# Patient Record
Sex: Female | Born: 1965 | Race: White | Hispanic: No | Smoking: Current every day smoker
Health system: Southern US, Community
[De-identification: ages and names within clinical notes are randomized; demographics above are authoritative.]

## PROBLEM LIST (undated history)

## (undated) VITALS — BP 89/56 | HR 69 | Temp 97.7°F | Resp 16 | Ht 66.0 in | Wt 154.0 lb

## (undated) DIAGNOSIS — IMO0002 Reserved for concepts with insufficient information to code with codable children: Secondary | ICD-10-CM

## (undated) DIAGNOSIS — F429 Obsessive-compulsive disorder, unspecified: Secondary | ICD-10-CM

## (undated) DIAGNOSIS — D45 Polycythemia vera: Principal | ICD-10-CM

## (undated) DIAGNOSIS — M719 Bursopathy, unspecified: Secondary | ICD-10-CM

## (undated) DIAGNOSIS — F4310 Post-traumatic stress disorder, unspecified: Secondary | ICD-10-CM

## (undated) DIAGNOSIS — F32A Depression, unspecified: Secondary | ICD-10-CM

## (undated) DIAGNOSIS — M797 Fibromyalgia: Secondary | ICD-10-CM

## (undated) DIAGNOSIS — M1990 Unspecified osteoarthritis, unspecified site: Secondary | ICD-10-CM

## (undated) DIAGNOSIS — I493 Ventricular premature depolarization: Secondary | ICD-10-CM

## (undated) DIAGNOSIS — R1011 Right upper quadrant pain: Secondary | ICD-10-CM

## (undated) DIAGNOSIS — F419 Anxiety disorder, unspecified: Secondary | ICD-10-CM

## (undated) DIAGNOSIS — H548 Legal blindness, as defined in USA: Secondary | ICD-10-CM

## (undated) DIAGNOSIS — N3281 Overactive bladder: Secondary | ICD-10-CM

## (undated) DIAGNOSIS — M2580 Other specified joint disorders, unspecified joint: Secondary | ICD-10-CM

## (undated) DIAGNOSIS — F329 Major depressive disorder, single episode, unspecified: Secondary | ICD-10-CM

## (undated) HISTORY — DX: Obsessive-compulsive disorder, unspecified: F42.9

## (undated) HISTORY — PX: LUMBAR FUSION: SHX111

## (undated) HISTORY — DX: Legal blindness, as defined in USA: H54.8

## (undated) HISTORY — PX: ABDOMINAL HYSTERECTOMY: SHX81

## (undated) HISTORY — DX: Polycythemia vera: D45

## (undated) HISTORY — DX: Right upper quadrant pain: R10.11

---

## 2000-08-21 ENCOUNTER — Encounter (INDEPENDENT_AMBULATORY_CARE_PROVIDER_SITE_OTHER): Payer: Self-pay | Admitting: Specialist

## 2000-08-21 ENCOUNTER — Other Ambulatory Visit: Admission: RE | Admit: 2000-08-21 | Discharge: 2000-08-21 | Payer: Self-pay | Admitting: Gastroenterology

## 2000-08-21 ENCOUNTER — Encounter: Payer: Self-pay | Admitting: Gastroenterology

## 2001-03-03 ENCOUNTER — Other Ambulatory Visit: Admission: RE | Admit: 2001-03-03 | Discharge: 2001-03-03 | Payer: Self-pay | Admitting: Gynecology

## 2002-04-08 ENCOUNTER — Other Ambulatory Visit: Admission: RE | Admit: 2002-04-08 | Discharge: 2002-04-08 | Payer: Self-pay | Admitting: Gynecology

## 2002-09-18 ENCOUNTER — Ambulatory Visit (HOSPITAL_COMMUNITY): Admission: RE | Admit: 2002-09-18 | Discharge: 2002-09-18 | Payer: Self-pay | Admitting: Family Medicine

## 2002-09-18 ENCOUNTER — Encounter: Payer: Self-pay | Admitting: Family Medicine

## 2003-04-26 ENCOUNTER — Other Ambulatory Visit: Admission: RE | Admit: 2003-04-26 | Discharge: 2003-04-26 | Payer: Self-pay | Admitting: Gynecology

## 2004-01-19 ENCOUNTER — Encounter (HOSPITAL_COMMUNITY): Admission: RE | Admit: 2004-01-19 | Discharge: 2004-04-18 | Payer: Self-pay | Admitting: Oncology

## 2004-02-28 ENCOUNTER — Other Ambulatory Visit: Admission: RE | Admit: 2004-02-28 | Discharge: 2004-02-28 | Payer: Self-pay | Admitting: Gynecology

## 2004-02-29 ENCOUNTER — Encounter: Admission: RE | Admit: 2004-02-29 | Discharge: 2004-02-29 | Payer: Self-pay | Admitting: Gynecology

## 2004-03-10 ENCOUNTER — Ambulatory Visit (HOSPITAL_COMMUNITY): Admission: RE | Admit: 2004-03-10 | Discharge: 2004-03-10 | Payer: Self-pay | Admitting: Family Medicine

## 2004-10-16 ENCOUNTER — Ambulatory Visit: Payer: Self-pay | Admitting: Oncology

## 2004-12-19 ENCOUNTER — Ambulatory Visit: Payer: Self-pay | Admitting: Oncology

## 2005-04-05 ENCOUNTER — Encounter (INDEPENDENT_AMBULATORY_CARE_PROVIDER_SITE_OTHER): Payer: Self-pay | Admitting: Specialist

## 2005-04-05 ENCOUNTER — Inpatient Hospital Stay (HOSPITAL_COMMUNITY): Admission: RE | Admit: 2005-04-05 | Discharge: 2005-04-07 | Payer: Self-pay | Admitting: Gynecology

## 2005-05-03 ENCOUNTER — Ambulatory Visit: Payer: Self-pay | Admitting: Oncology

## 2005-06-28 ENCOUNTER — Ambulatory Visit: Payer: Self-pay | Admitting: Oncology

## 2005-07-12 ENCOUNTER — Ambulatory Visit: Payer: Self-pay | Admitting: Oncology

## 2005-07-12 ENCOUNTER — Ambulatory Visit (HOSPITAL_COMMUNITY): Admission: RE | Admit: 2005-07-12 | Discharge: 2005-07-12 | Payer: Self-pay | Admitting: Oncology

## 2005-07-12 ENCOUNTER — Encounter (INDEPENDENT_AMBULATORY_CARE_PROVIDER_SITE_OTHER): Payer: Self-pay | Admitting: *Deleted

## 2005-07-19 ENCOUNTER — Encounter (HOSPITAL_COMMUNITY): Admission: RE | Admit: 2005-07-19 | Discharge: 2005-10-17 | Payer: Self-pay | Admitting: Oncology

## 2005-08-21 ENCOUNTER — Ambulatory Visit (HOSPITAL_COMMUNITY): Admission: RE | Admit: 2005-08-21 | Discharge: 2005-08-21 | Payer: Self-pay | Admitting: Family Medicine

## 2005-08-23 ENCOUNTER — Ambulatory Visit: Payer: Self-pay | Admitting: Oncology

## 2005-10-23 ENCOUNTER — Ambulatory Visit: Payer: Self-pay | Admitting: Oncology

## 2005-11-15 ENCOUNTER — Encounter: Admission: RE | Admit: 2005-11-15 | Discharge: 2005-11-15 | Payer: Self-pay | Admitting: Specialist

## 2005-12-27 ENCOUNTER — Ambulatory Visit: Payer: Self-pay | Admitting: Oncology

## 2006-02-28 ENCOUNTER — Ambulatory Visit: Payer: Self-pay | Admitting: Oncology

## 2006-03-01 LAB — CBC WITH DIFFERENTIAL/PLATELET
BASO%: 0.6 % (ref 0.0–2.0)
Basophils Absolute: 0 10*3/uL (ref 0.0–0.1)
EOS%: 2.1 % (ref 0.0–7.0)
HGB: 13.7 g/dL (ref 11.6–15.9)
MCH: 30.6 pg (ref 26.0–34.0)
MCHC: 33.2 g/dL (ref 32.0–36.0)
MCV: 92.2 fL (ref 81.0–101.0)
MONO%: 7.9 % (ref 0.0–13.0)
NEUT%: 55.2 % (ref 39.6–76.8)
RDW: 14.6 % — ABNORMAL HIGH (ref 11.3–14.5)

## 2006-04-19 ENCOUNTER — Ambulatory Visit: Payer: Self-pay | Admitting: Oncology

## 2006-04-19 LAB — CBC WITH DIFFERENTIAL/PLATELET
Basophils Absolute: 0 10*3/uL (ref 0.0–0.1)
EOS%: 1.7 % (ref 0.0–7.0)
Eosinophils Absolute: 0.1 10*3/uL (ref 0.0–0.5)
HCT: 38.4 % (ref 34.8–46.6)
HGB: 12.9 g/dL (ref 11.6–15.9)
MCH: 30.3 pg (ref 26.0–34.0)
MCV: 89.9 fL (ref 81.0–101.0)
MONO%: 8 % (ref 0.0–13.0)
NEUT#: 4.6 10*3/uL (ref 1.5–6.5)
NEUT%: 60.3 % (ref 39.6–76.8)
RDW: 14.3 % (ref 11.3–14.5)
lymph#: 2.3 10*3/uL (ref 0.9–3.3)

## 2006-04-19 LAB — MORPHOLOGY: PLT EST: ADEQUATE

## 2006-04-22 LAB — SEDIMENTATION RATE: Sed Rate: 5 mm/hr (ref 0–22)

## 2006-04-22 LAB — COMPREHENSIVE METABOLIC PANEL
ALT: 10 U/L (ref 0–40)
AST: 11 U/L (ref 0–37)
Albumin: 4.4 g/dL (ref 3.5–5.2)
CO2: 23 mEq/L (ref 19–32)
Calcium: 8.5 mg/dL (ref 8.4–10.5)
Chloride: 106 mEq/L (ref 96–112)
Potassium: 3.7 mEq/L (ref 3.5–5.3)

## 2006-04-22 LAB — LACTATE DEHYDROGENASE: LDH: 116 U/L (ref 94–250)

## 2006-04-22 LAB — ERYTHROPOIETIN: Erythropoietin: 12.5 m[IU]/mL (ref 2.6–34.0)

## 2006-06-19 ENCOUNTER — Ambulatory Visit: Payer: Self-pay | Admitting: Oncology

## 2006-06-21 LAB — CBC WITH DIFFERENTIAL/PLATELET
BASO%: 0.5 % (ref 0.0–2.0)
EOS%: 2.6 % (ref 0.0–7.0)
HCT: 46.2 % (ref 34.8–46.6)
LYMPH%: 28.5 % (ref 14.0–48.0)
MCH: 31.7 pg (ref 26.0–34.0)
MCHC: 33.8 g/dL (ref 32.0–36.0)
MCV: 93.8 fL (ref 81.0–101.0)
NEUT%: 59.3 % (ref 39.6–76.8)
Platelets: 351 10*3/uL (ref 145–400)

## 2006-06-25 LAB — VITAMIN D PNL(25-HYDRXY+1,25-DIHY)-BLD: Vit D, 1,25-Dihydroxy: 65 pg/mL (ref 15–75)

## 2006-07-05 LAB — CBC WITH DIFFERENTIAL/PLATELET
Basophils Absolute: 0.1 10*3/uL (ref 0.0–0.1)
Eosinophils Absolute: 0.2 10*3/uL (ref 0.0–0.5)
HGB: 15.9 g/dL (ref 11.6–15.9)
MCV: 93.8 fL (ref 81.0–101.0)
MONO%: 8.3 % (ref 0.0–13.0)
NEUT#: 6.2 10*3/uL (ref 1.5–6.5)
Platelets: 289 10*3/uL (ref 145–400)
RDW: 15.3 % — ABNORMAL HIGH (ref 11.3–14.5)

## 2006-08-16 ENCOUNTER — Ambulatory Visit: Payer: Self-pay | Admitting: Oncology

## 2006-08-28 LAB — CBC WITH DIFFERENTIAL/PLATELET
BASO%: 0.5 % (ref 0.0–2.0)
Eosinophils Absolute: 0.1 10*3/uL (ref 0.0–0.5)
HCT: 49.1 % — ABNORMAL HIGH (ref 34.8–46.6)
LYMPH%: 28.2 % (ref 14.0–48.0)
MCHC: 34.4 g/dL (ref 32.0–36.0)
MCV: 96.5 fL (ref 81.0–101.0)
MONO#: 0.9 10*3/uL (ref 0.1–0.9)
MONO%: 10.3 % (ref 0.0–13.0)
NEUT%: 59.7 % (ref 39.6–76.8)
Platelets: 362 10*3/uL (ref 145–400)
RBC: 5.09 10*6/uL (ref 3.70–5.32)
WBC: 8.7 10*3/uL (ref 3.9–10.0)

## 2006-10-01 ENCOUNTER — Ambulatory Visit: Payer: Self-pay | Admitting: Oncology

## 2006-11-11 ENCOUNTER — Ambulatory Visit: Payer: Self-pay | Admitting: Oncology

## 2006-11-11 LAB — CBC WITH DIFFERENTIAL/PLATELET
BASO%: 1.2 % (ref 0.0–2.0)
Basophils Absolute: 0.1 10*3/uL (ref 0.0–0.1)
EOS%: 1.7 % (ref 0.0–7.0)
HGB: 18.4 g/dL — ABNORMAL HIGH (ref 11.6–15.9)
MCH: 33.2 pg (ref 26.0–34.0)
MCHC: 33.9 g/dL (ref 32.0–36.0)
MCV: 97.7 fL (ref 81.0–101.0)
MONO%: 8 % (ref 0.0–13.0)
RBC: 5.54 10*6/uL — ABNORMAL HIGH (ref 3.70–5.32)
RDW: 13.7 % (ref 11.3–14.5)
lymph#: 2.1 10*3/uL (ref 0.9–3.3)

## 2006-11-11 LAB — IRON AND TIBC
Iron: 82 ug/dL (ref 42–145)
UIBC: 277 ug/dL

## 2006-11-15 LAB — CBC WITH DIFFERENTIAL/PLATELET
BASO%: 1.1 % (ref 0.0–2.0)
LYMPH%: 23.7 % (ref 14.0–48.0)
MCHC: 34.9 g/dL (ref 32.0–36.0)
MCV: 94.3 fL (ref 81.0–101.0)
MONO%: 11.5 % (ref 0.0–13.0)
Platelets: 268 10*3/uL (ref 145–400)
RBC: 5.44 10*6/uL — ABNORMAL HIGH (ref 3.70–5.32)
RDW: 11.6 % (ref 11.3–14.5)
WBC: 8.7 10*3/uL (ref 3.9–10.0)

## 2006-12-20 LAB — CBC WITH DIFFERENTIAL/PLATELET
BASO%: 0.6 % (ref 0.0–2.0)
MCHC: 35.6 g/dL (ref 32.0–36.0)
MONO#: 0.7 10*3/uL (ref 0.1–0.9)
RBC: 4.99 10*6/uL (ref 3.70–5.32)
RDW: 13.1 % (ref 11.3–14.5)
WBC: 7.3 10*3/uL (ref 3.9–10.0)
lymph#: 2 10*3/uL (ref 0.9–3.3)

## 2007-02-18 ENCOUNTER — Ambulatory Visit: Payer: Self-pay | Admitting: Oncology

## 2007-02-21 LAB — CBC WITH DIFFERENTIAL/PLATELET
Basophils Absolute: 0 10*3/uL (ref 0.0–0.1)
Eosinophils Absolute: 0.2 10*3/uL (ref 0.0–0.5)
HGB: 16.7 g/dL — ABNORMAL HIGH (ref 11.6–15.9)
MCV: 96.7 fL (ref 81.0–101.0)
MONO#: 1 10*3/uL — ABNORMAL HIGH (ref 0.1–0.9)
MONO%: 12 % (ref 0.0–13.0)
NEUT#: 5.1 10*3/uL (ref 1.5–6.5)
Platelets: 275 10*3/uL (ref 145–400)
RDW: 13.1 % (ref 11.3–14.5)
WBC: 8.5 10*3/uL (ref 3.9–10.0)

## 2007-03-13 ENCOUNTER — Encounter: Admission: RE | Admit: 2007-03-13 | Discharge: 2007-03-13 | Payer: Self-pay | Admitting: Specialist

## 2007-05-01 ENCOUNTER — Ambulatory Visit: Payer: Self-pay | Admitting: Oncology

## 2007-05-05 ENCOUNTER — Other Ambulatory Visit: Admission: RE | Admit: 2007-05-05 | Discharge: 2007-05-05 | Payer: Self-pay | Admitting: Gynecology

## 2007-05-05 LAB — CBC WITH DIFFERENTIAL/PLATELET
Basophils Absolute: 0.1 10*3/uL (ref 0.0–0.1)
Eosinophils Absolute: 0.1 10*3/uL (ref 0.0–0.5)
HCT: 45.9 % (ref 34.8–46.6)
LYMPH%: 27 % (ref 14.0–48.0)
MCV: 98.1 fL (ref 81.0–101.0)
MONO#: 0.6 10*3/uL (ref 0.1–0.9)
MONO%: 9.3 % (ref 0.0–13.0)
NEUT#: 4.2 10*3/uL (ref 1.5–6.5)
NEUT%: 61.9 % (ref 39.6–76.8)
Platelets: 296 10*3/uL (ref 145–400)
RBC: 4.69 10*6/uL (ref 3.70–5.32)
WBC: 6.8 10*3/uL (ref 3.9–10.0)

## 2007-05-05 LAB — COMPREHENSIVE METABOLIC PANEL
Albumin: 4.3 g/dL (ref 3.5–5.2)
BUN: 8 mg/dL (ref 6–23)
CO2: 25 mEq/L (ref 19–32)
Calcium: 8.9 mg/dL (ref 8.4–10.5)
Chloride: 104 mEq/L (ref 96–112)
Glucose, Bld: 92 mg/dL (ref 70–99)
Potassium: 3.9 mEq/L (ref 3.5–5.3)
Sodium: 140 mEq/L (ref 135–145)
Total Protein: 6.5 g/dL (ref 6.0–8.3)

## 2007-05-05 LAB — IRON AND TIBC
TIBC: 332 ug/dL (ref 250–470)
UIBC: 211 ug/dL

## 2007-05-05 LAB — VITAMIN B12: Vitamin B-12: 290 pg/mL (ref 211–911)

## 2007-06-11 ENCOUNTER — Ambulatory Visit: Payer: Self-pay | Admitting: Oncology

## 2007-06-13 LAB — CBC WITH DIFFERENTIAL/PLATELET
BASO%: 0.5 % (ref 0.0–2.0)
Basophils Absolute: 0 10*3/uL (ref 0.0–0.1)
HCT: 44.1 % (ref 34.8–46.6)
HGB: 15.8 g/dL (ref 11.6–15.9)
LYMPH%: 29 % (ref 14.0–48.0)
MCH: 35 pg — ABNORMAL HIGH (ref 26.0–34.0)
MCHC: 35.9 g/dL (ref 32.0–36.0)
MONO#: 0.5 10*3/uL (ref 0.1–0.9)
NEUT%: 60.2 % (ref 39.6–76.8)
Platelets: 296 10*3/uL (ref 145–400)
WBC: 6.1 10*3/uL (ref 3.9–10.0)
lymph#: 1.8 10*3/uL (ref 0.9–3.3)

## 2007-08-15 ENCOUNTER — Encounter (HOSPITAL_COMMUNITY): Admission: RE | Admit: 2007-08-15 | Discharge: 2007-10-16 | Payer: Self-pay | Admitting: Oncology

## 2007-10-08 ENCOUNTER — Encounter: Admission: RE | Admit: 2007-10-08 | Discharge: 2007-10-08 | Payer: Self-pay | Admitting: Family Medicine

## 2007-10-17 ENCOUNTER — Encounter (HOSPITAL_COMMUNITY): Admission: RE | Admit: 2007-10-17 | Discharge: 2007-11-12 | Payer: Self-pay | Admitting: Oncology

## 2007-11-13 ENCOUNTER — Encounter (HOSPITAL_COMMUNITY): Admission: RE | Admit: 2007-11-13 | Discharge: 2008-02-11 | Payer: Self-pay | Admitting: Oncology

## 2008-04-01 ENCOUNTER — Encounter (HOSPITAL_COMMUNITY): Admission: RE | Admit: 2008-04-01 | Discharge: 2008-06-30 | Payer: Self-pay | Admitting: Oncology

## 2008-06-06 ENCOUNTER — Ambulatory Visit: Payer: Self-pay | Admitting: Oncology

## 2008-06-14 LAB — MORPHOLOGY
PLT EST: ADEQUATE
RBC Comments: NORMAL

## 2008-06-14 LAB — COMPREHENSIVE METABOLIC PANEL
ALT: 10 U/L (ref 0–35)
AST: 10 U/L (ref 0–37)
Albumin: 4.2 g/dL (ref 3.5–5.2)
Alkaline Phosphatase: 57 U/L (ref 39–117)
Chloride: 109 mEq/L (ref 96–112)
Potassium: 4.1 mEq/L (ref 3.5–5.3)
Sodium: 140 mEq/L (ref 135–145)
Total Protein: 6.3 g/dL (ref 6.0–8.3)

## 2008-06-14 LAB — CBC WITH DIFFERENTIAL/PLATELET
Basophils Absolute: 0 10*3/uL (ref 0.0–0.1)
Eosinophils Absolute: 0.1 10*3/uL (ref 0.0–0.5)
HGB: 16.1 g/dL — ABNORMAL HIGH (ref 11.6–15.9)
MONO#: 0.7 10*3/uL (ref 0.1–0.9)
NEUT#: 4.3 10*3/uL (ref 1.5–6.5)
RBC: 4.7 10*6/uL (ref 3.70–5.32)
RDW: 13.6 % (ref 11.3–14.5)
WBC: 7 10*3/uL (ref 3.9–10.0)

## 2008-06-14 LAB — IRON AND TIBC: UIBC: 174 ug/dL

## 2008-06-14 LAB — FERRITIN: Ferritin: 18 ng/mL (ref 10–291)

## 2008-07-22 ENCOUNTER — Encounter (HOSPITAL_COMMUNITY): Admission: RE | Admit: 2008-07-22 | Discharge: 2008-10-20 | Payer: Self-pay | Admitting: Oncology

## 2008-09-29 ENCOUNTER — Ambulatory Visit (HOSPITAL_COMMUNITY): Admission: RE | Admit: 2008-09-29 | Discharge: 2008-09-29 | Payer: Self-pay | Admitting: Family Medicine

## 2008-09-29 IMAGING — US US ABDOMEN COMPLETE
1 series · 14 of 25 positions shown · non-contrast
Comparison: None

CLINICAL DATA: Right upper quadrant pain, nausea, fatigue

ABDOMEN ULTRASOUND
TECHNIQUE: Complete abdominal ultrasound examination was performed
including evaluation of the liver, gallbladder, bile ducts,
pancreas, kidneys, spleen, IVC, and abdominal aorta.

[Series 1: unknown · 0.30mm/px · 14 of 67 slices shown]
[im 1/67]
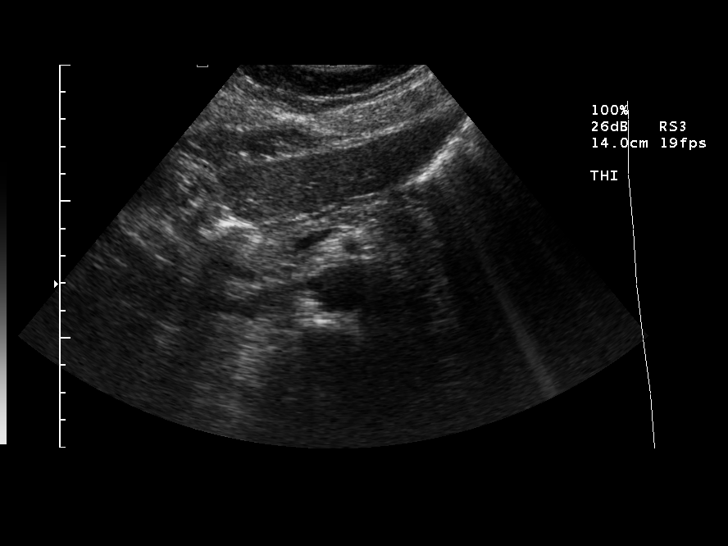
[im 6/67]
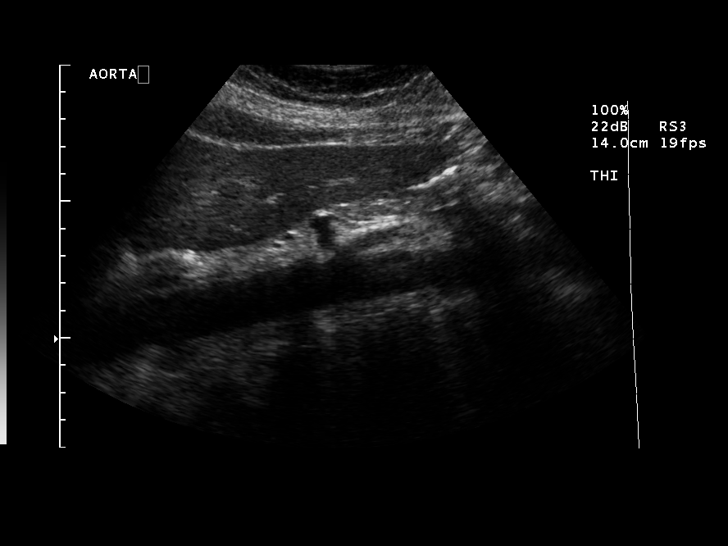
[im 12/67]
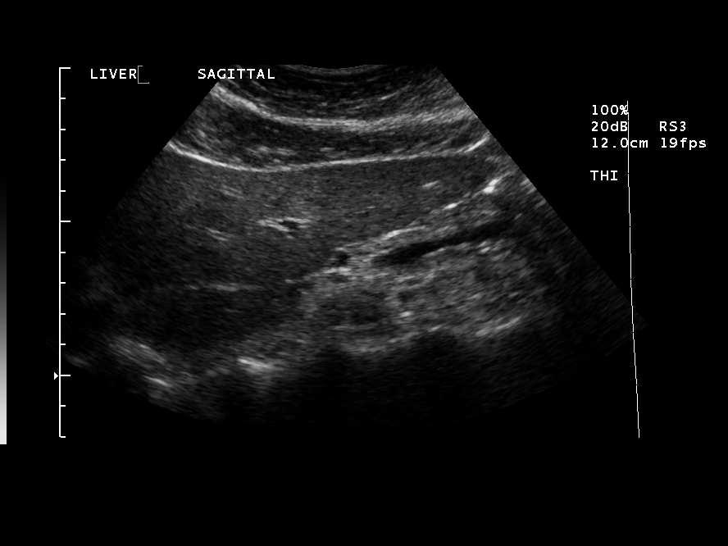
[im 17/67]
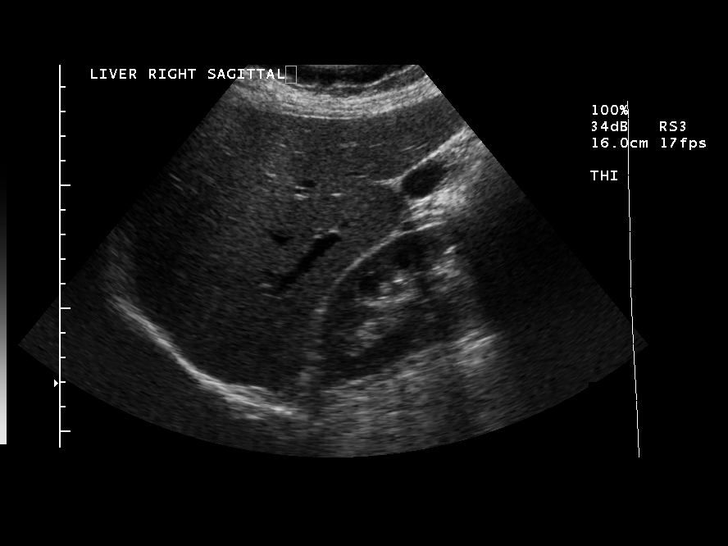
[im 23/67]
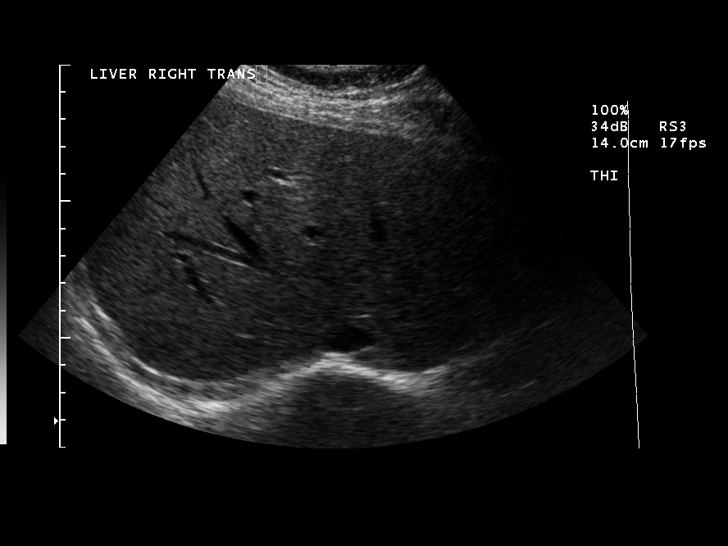
[im 25/67]
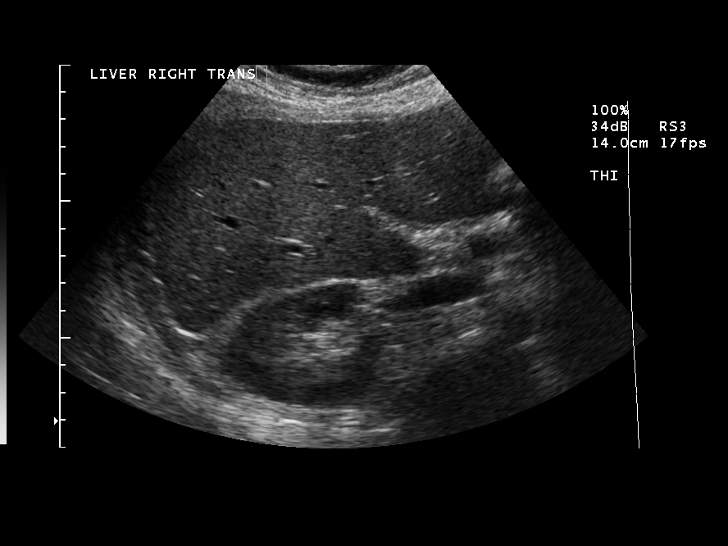
[im 31/67]
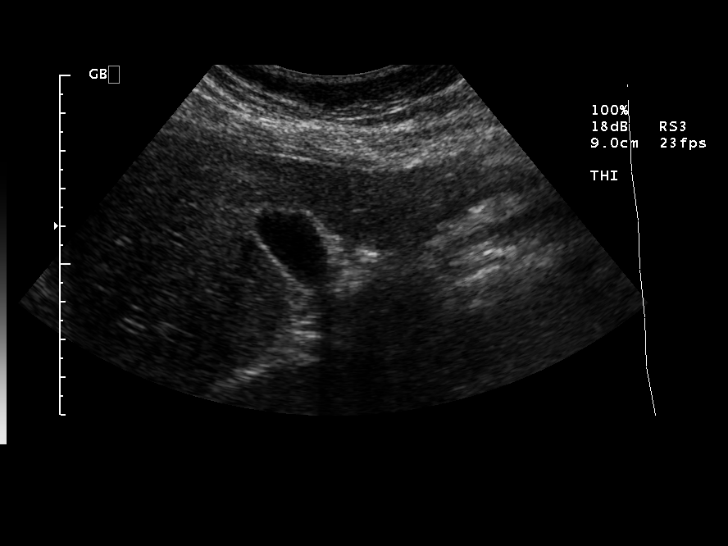
[im 36/67]
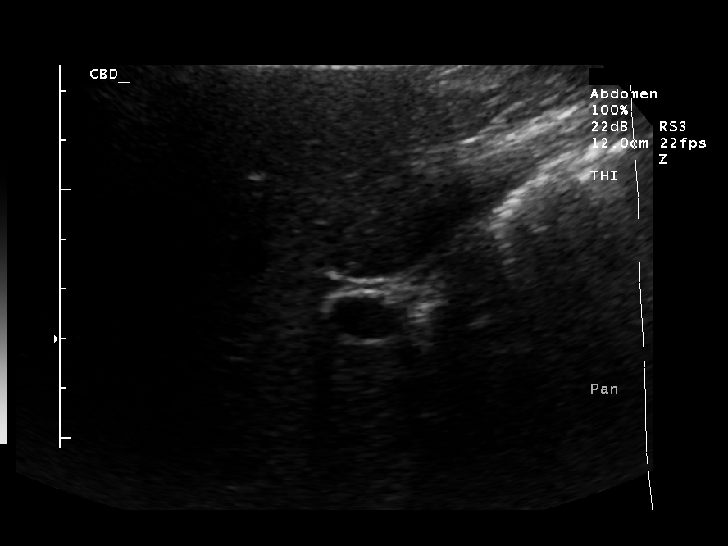
[im 42/67]
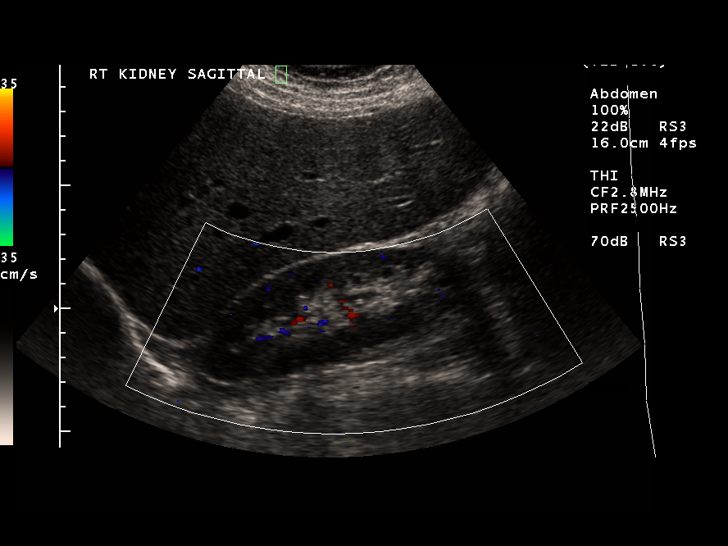
[im 45/67]
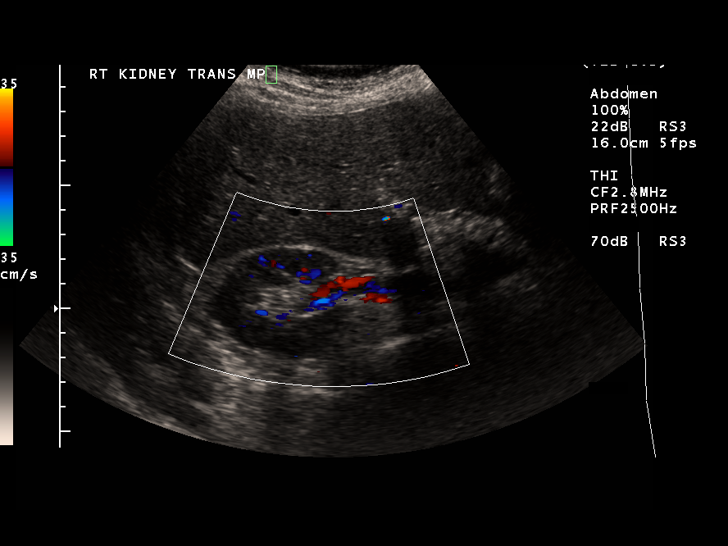
[im 50/67]
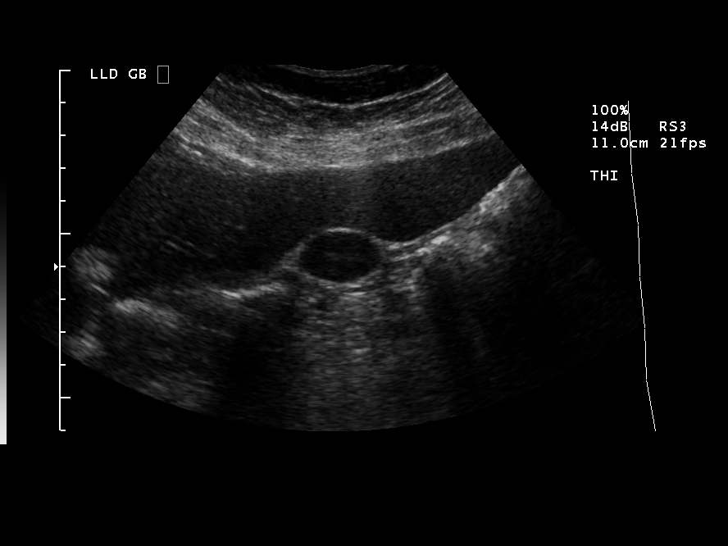
[im 56/67]
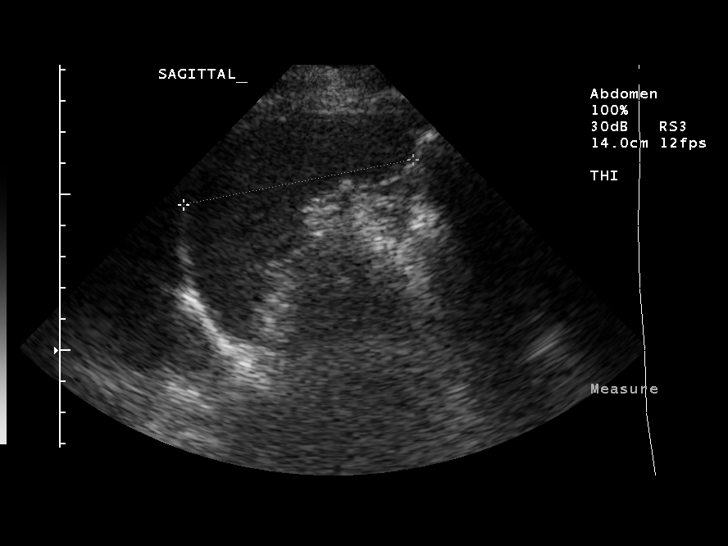
[im 61/67]
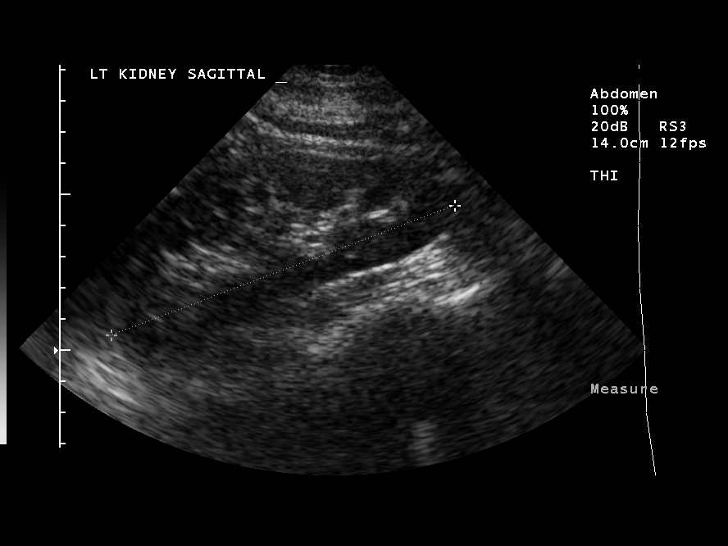
[im 67/67]
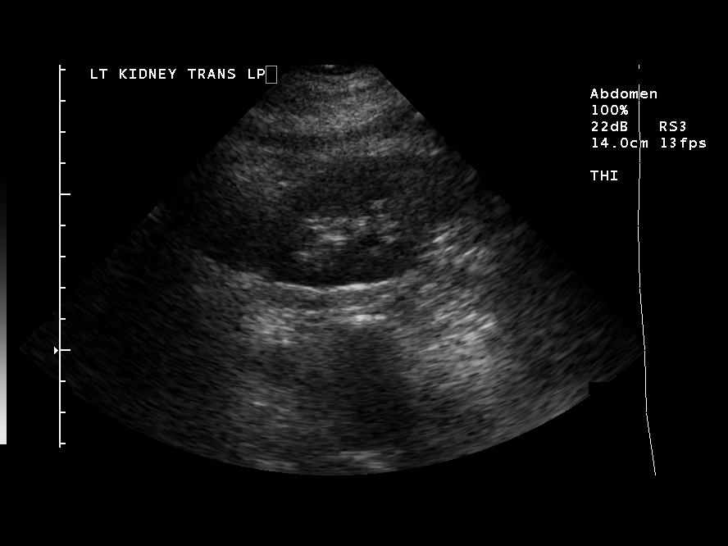

[14 of 25 positions shown; findings below may reference images not displayed]

FINDINGS: Gallbladder normally distended without stones or wall thickening.
No sonographic Murphy sign.
Common bile duct normal caliber, 3.0 mm diameter.
Liver, pancreas, and spleen normal appearance, spleen 7.5 cm
length.
Kidneys normal size and morphology, 12.6 cm length right and
cm length left.
Aorta and IVC normal.
No free fluid.
IMPRESSION: Normal upper abdominal ultrasound.
Per physician order, patient will be scheduled for hepatic biliary
scan.

## 2008-11-01 ENCOUNTER — Encounter: Admission: RE | Admit: 2008-11-01 | Discharge: 2008-11-01 | Payer: Self-pay | Admitting: Specialist

## 2008-11-08 ENCOUNTER — Encounter: Admission: RE | Admit: 2008-11-08 | Discharge: 2008-11-08 | Payer: Self-pay | Admitting: Gynecology

## 2008-11-08 IMAGING — MG MM SCREEN MAMMOGRAM BILATERAL
4 series · 4 of 4 positions shown · non-contrast
Comparison: none

DG SCREEN MAMMOGRAM BILATERAL
Bilateral CC and MLO view(s) were taken.

DIGITAL SCREENING MAMMOGRAM WITH CAD:
The breast tissue is heterogeneously dense.  No masses or malignant type calcifications are 
identified.  Compared with prior studies.

[R CC]
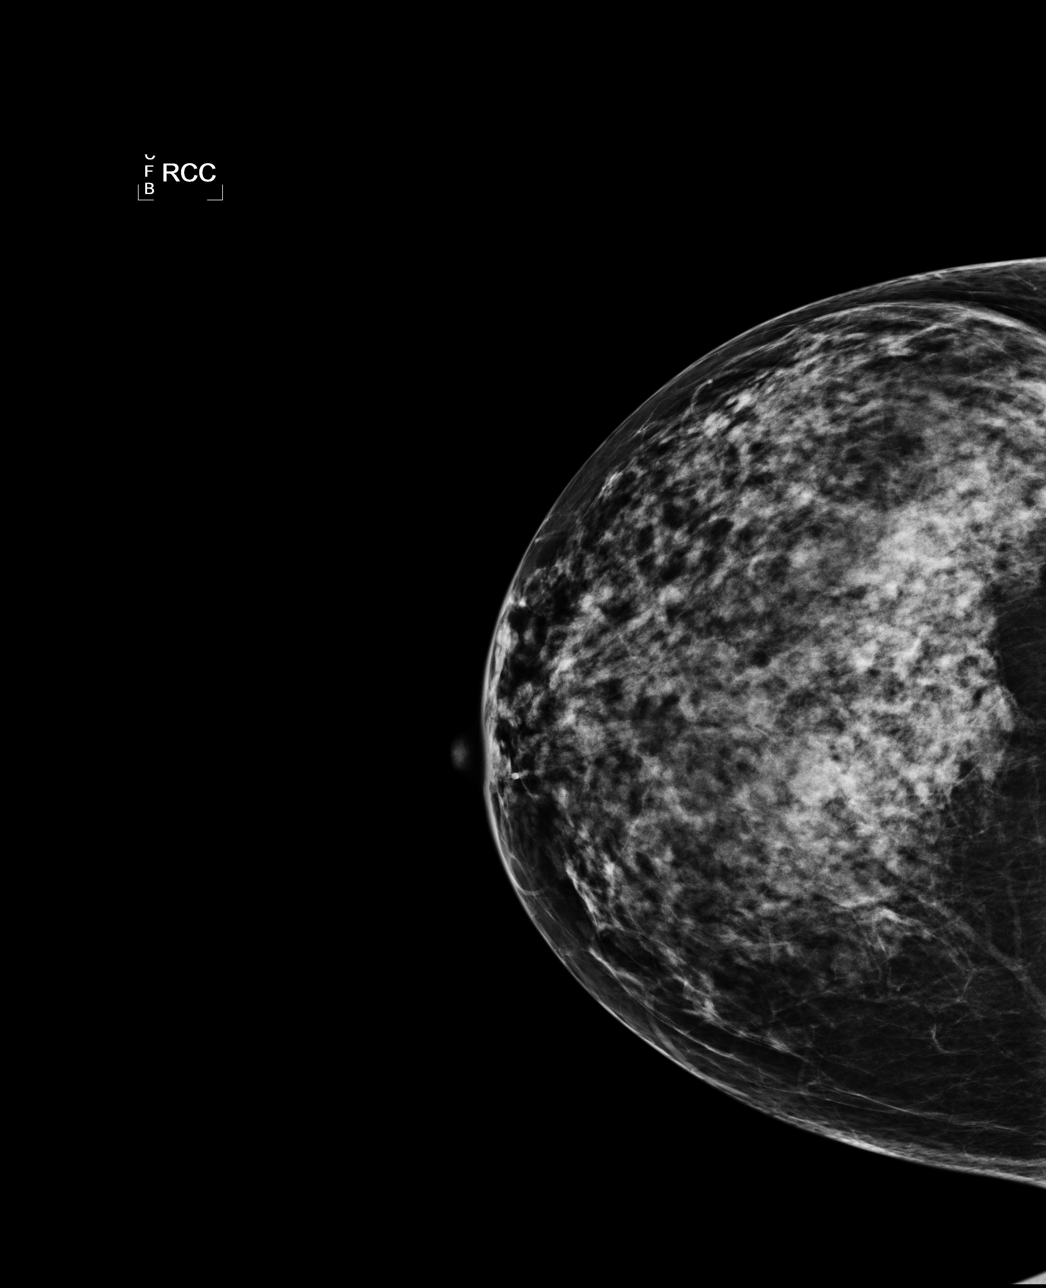

[L CC]
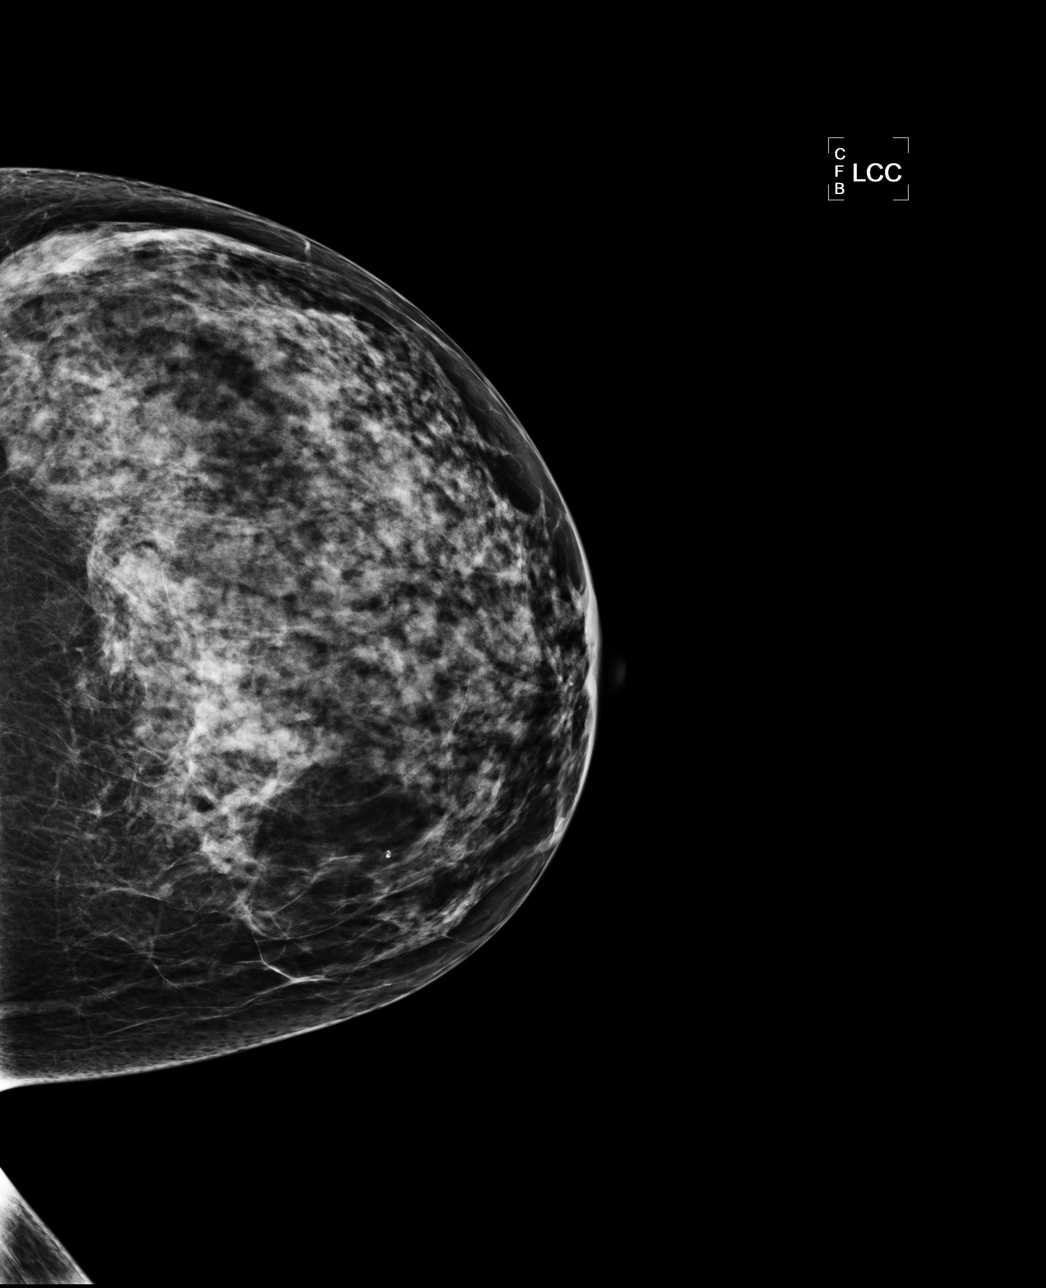

[L MLO]
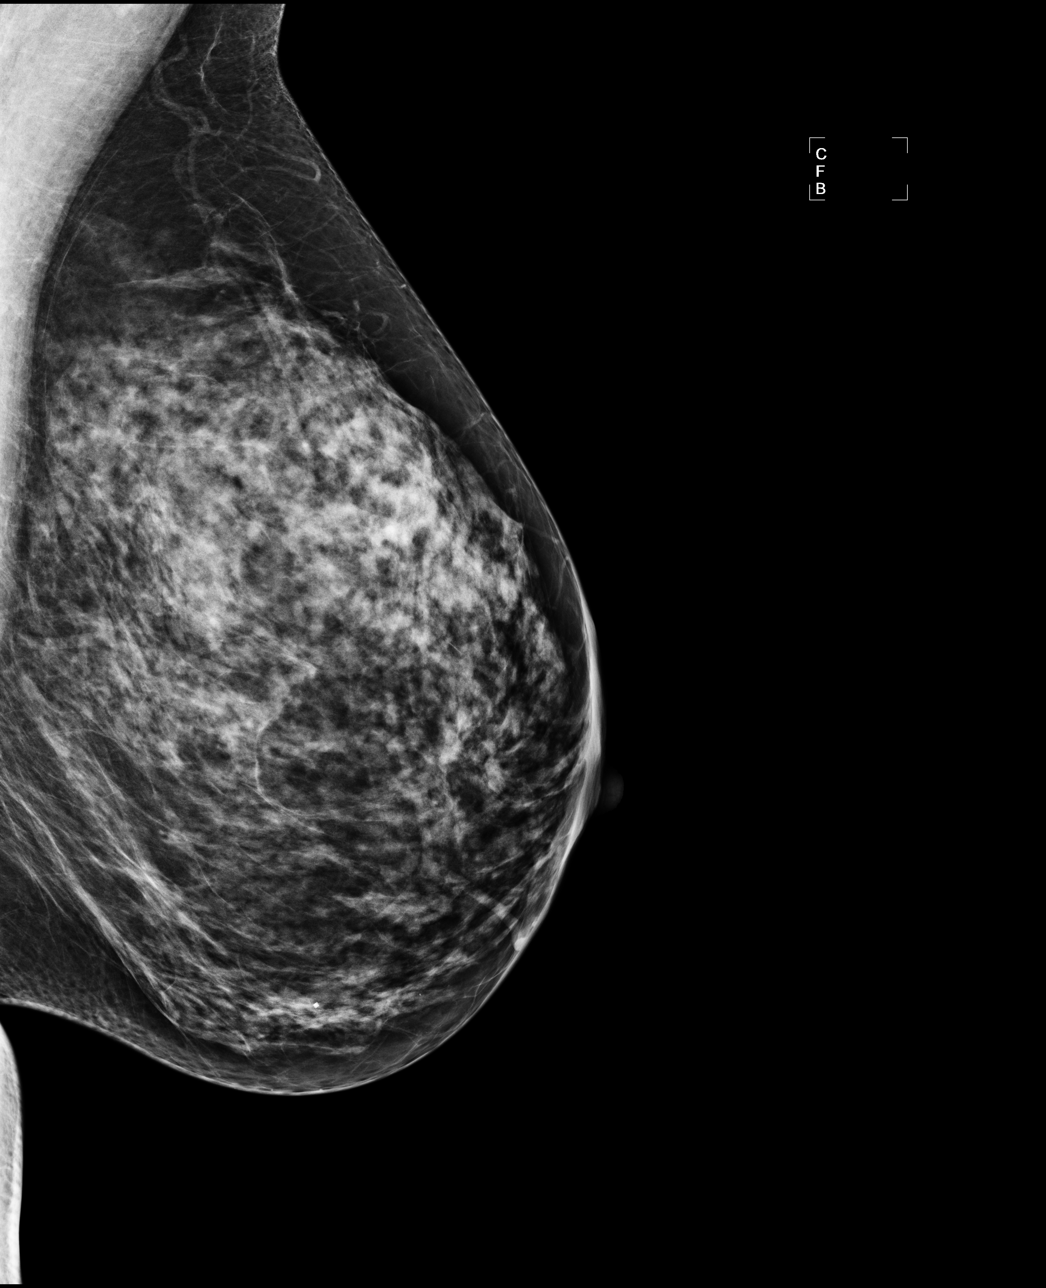

[R MLO]
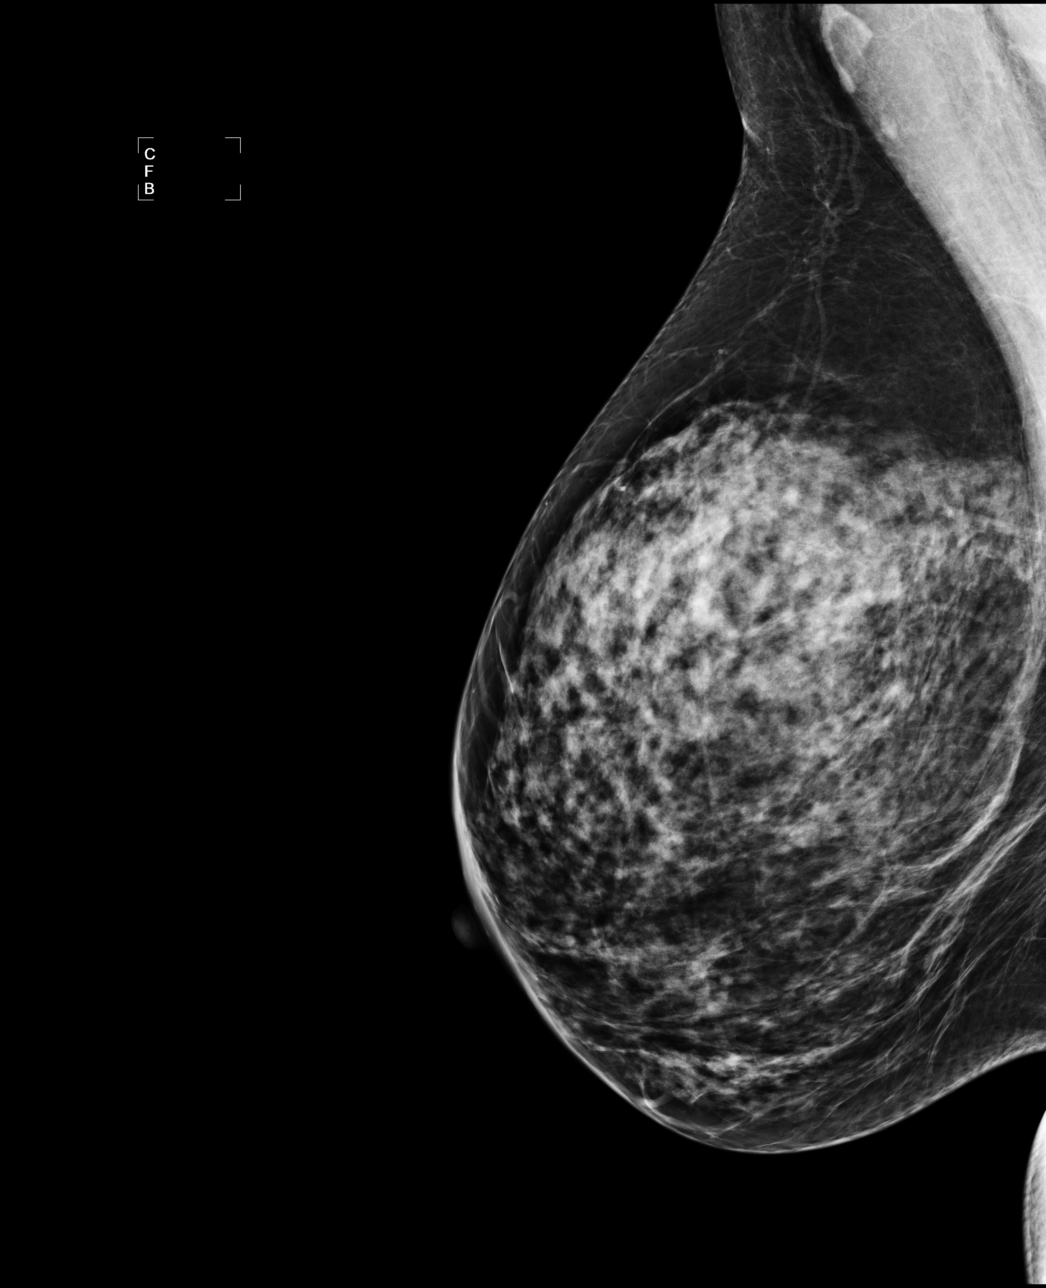

[4 of 4 positions shown; findings below may reference images not displayed]

IMPRESSION: No specific mammographic evidence of malignancy.  Next screening mammogram is recommended in one 
year.

ASSESSMENT: Negative - BI-RADS 1

Screening mammogram in 1 year.
ANALYZED BY COMPUTER AIDED DETECTION. , THIS PROCEDURE WAS A DIGITAL MAMMOGRAM.

## 2008-11-09 ENCOUNTER — Encounter (HOSPITAL_COMMUNITY): Admission: RE | Admit: 2008-11-09 | Discharge: 2009-02-07 | Payer: Self-pay | Admitting: Oncology

## 2009-01-10 ENCOUNTER — Ambulatory Visit (HOSPITAL_COMMUNITY): Admission: RE | Admit: 2009-01-10 | Discharge: 2009-01-10 | Payer: Self-pay | Admitting: Internal Medicine

## 2009-01-10 IMAGING — CT CT CHEST W/ CM
2 of 4 series · 15 of 36 positions shown, 18 images · IV contrast (Omnipaque 300)
Comparison: Chest x-ray [DATE]

CLINICAL DATA: Right axillary mass with night sweats.

CT CHEST WITH CONTRAST
TECHNIQUE: Multidetector CT imaging of the chest was performed
following the standard protocol during bolus administration of
intravenous contrast.
Contrast: 80 ml [8A]

[Series 2: chestroutine 5.0 b40f · axial · 0.63mm/px · z∈[-298,-24]mm · 12 of 65 slices shown, 15 images]
[im 5/65  mediastinal]
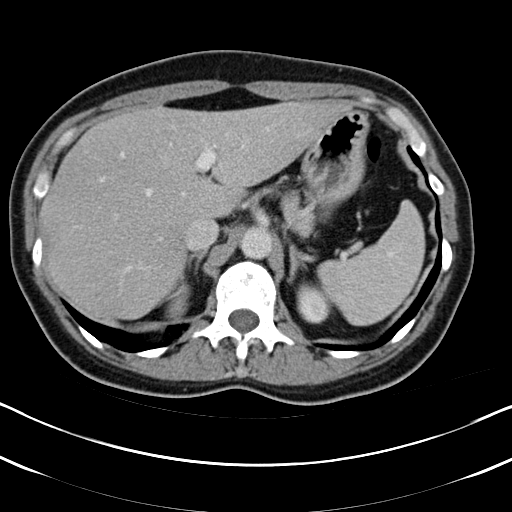
[im 5/65  lung]
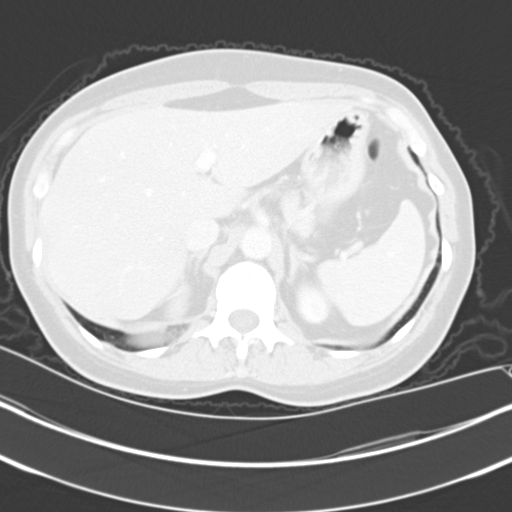
[im 10/65  lung]
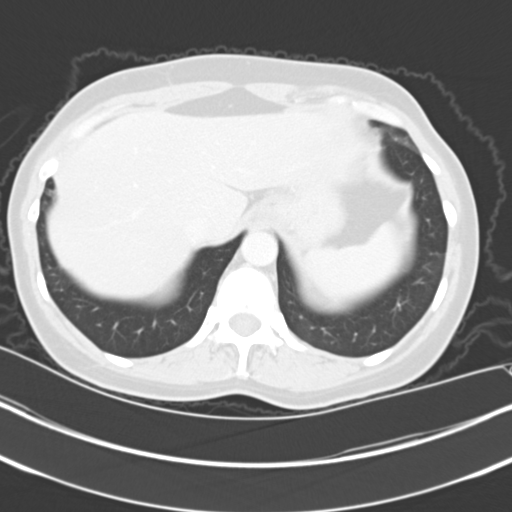
[im 15/65  lung]
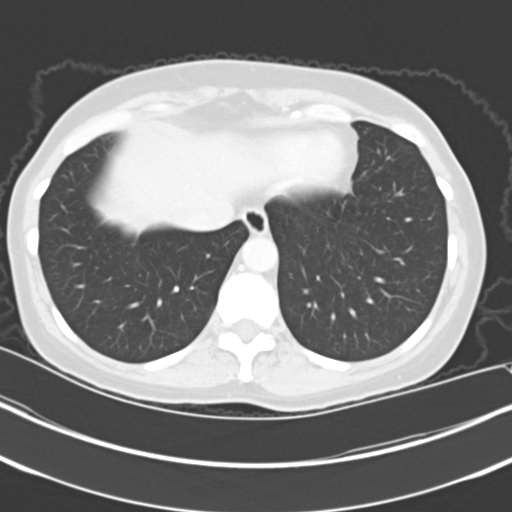
[im 20/65  lung]
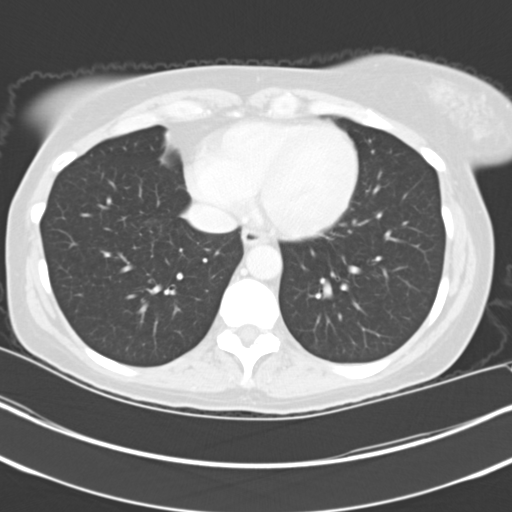
[im 25/65  mediastinal]
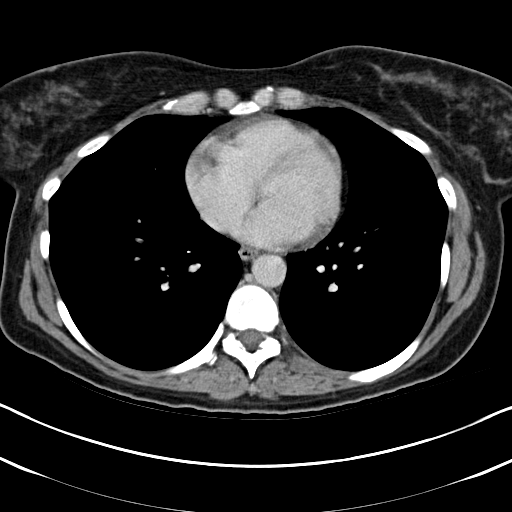
[im 25/65  lung]
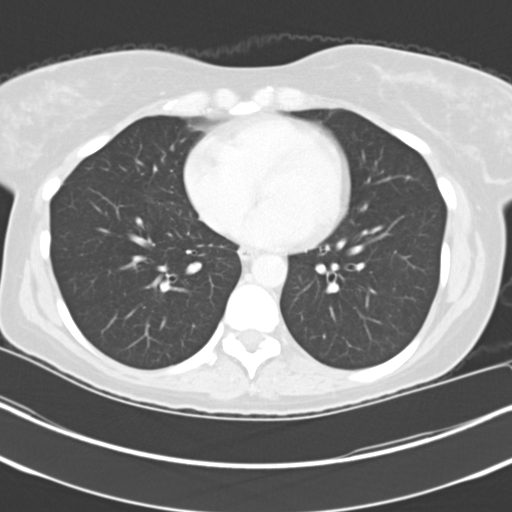
[im 30/65  lung]
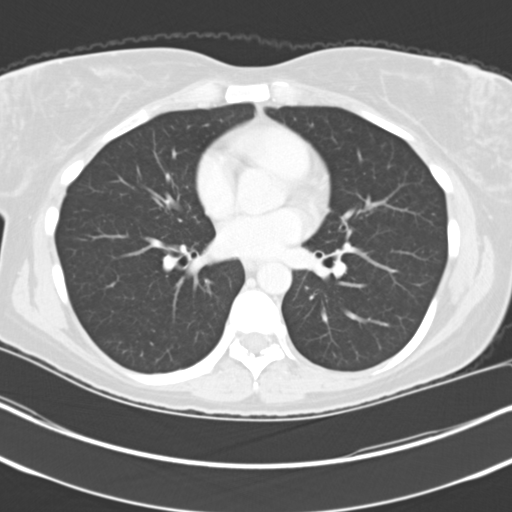
[im 35/65  lung]
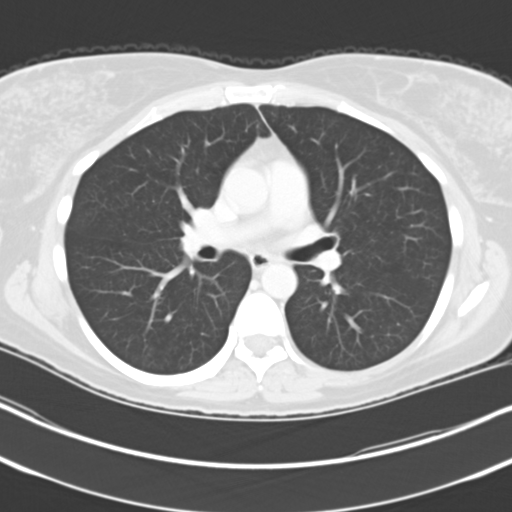
[im 40/65  lung]
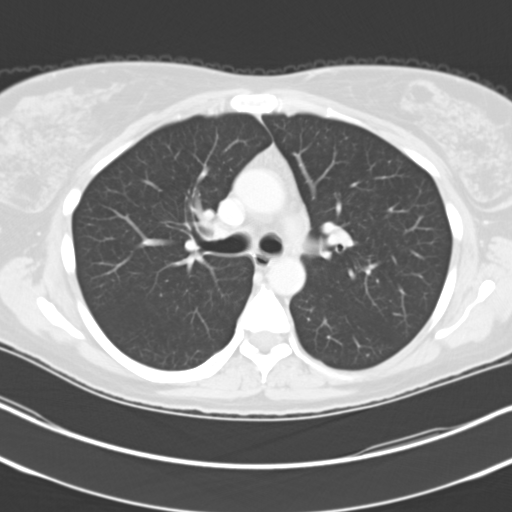
[im 45/65  mediastinal]
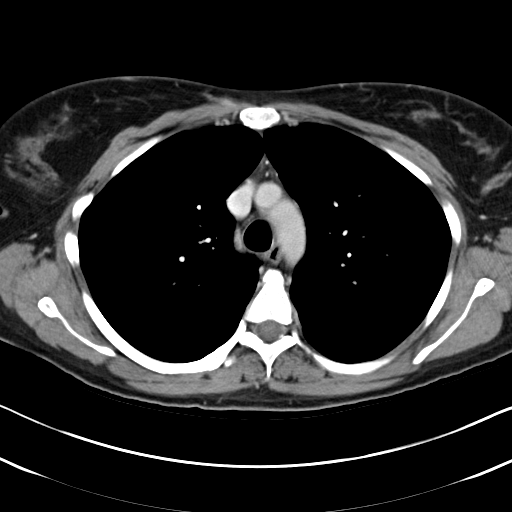
[im 45/65  lung]
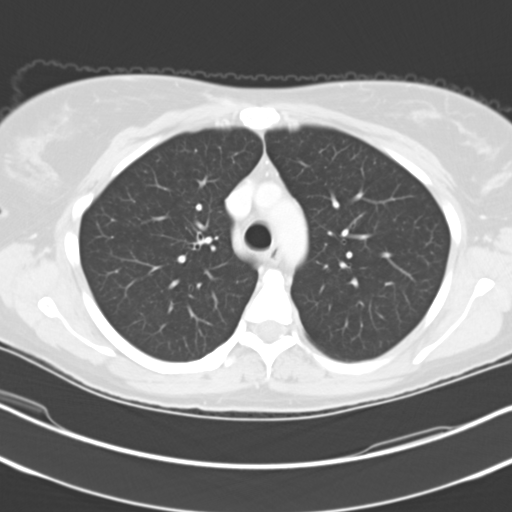
[im 50/65  lung]
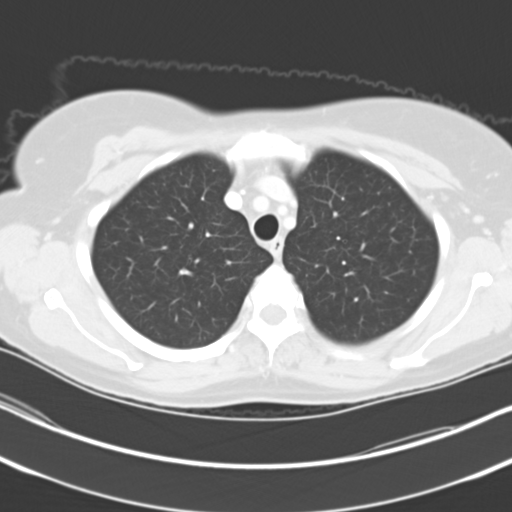
[im 55/65  lung]
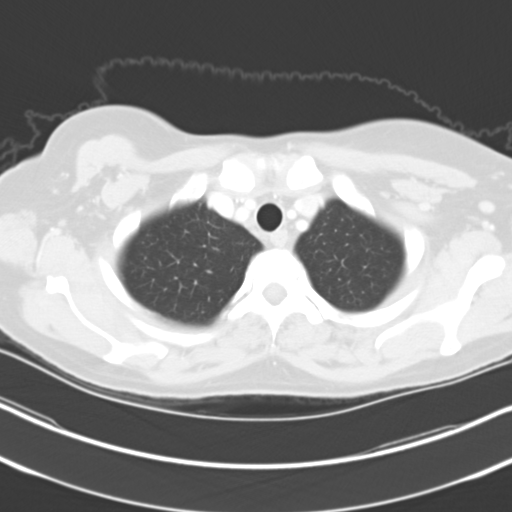
[im 60/65  lung]
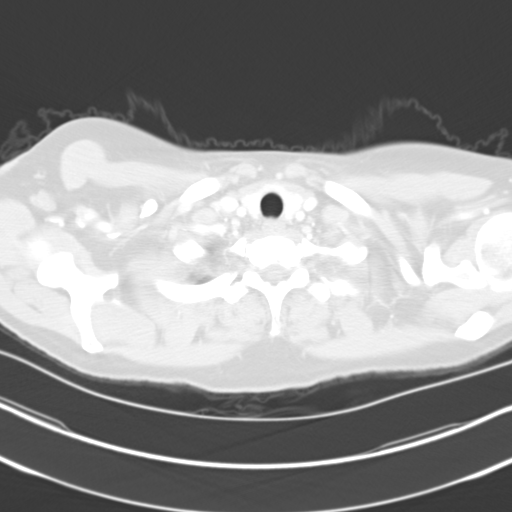

[Series 4: mpr coronal chest 3mm · coronal · 0.63mm/px · 3 of 71 slices shown]
[im 15/71  lung]
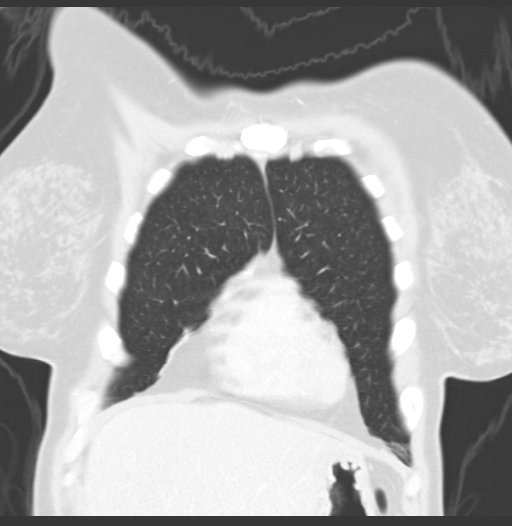
[im 29/71  lung]
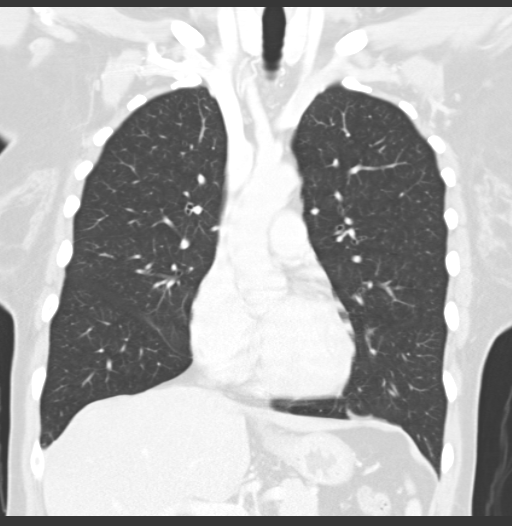
[im 43/71  lung]
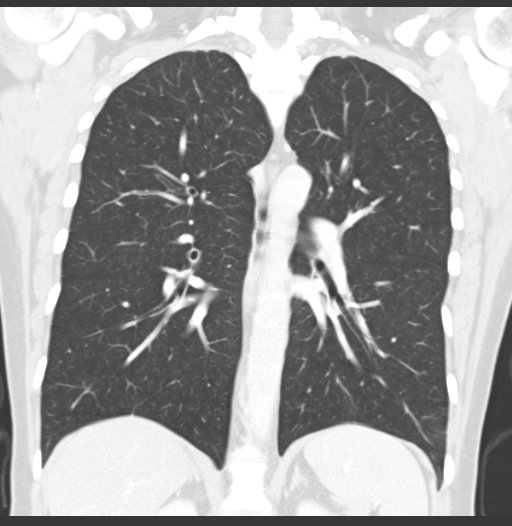

[15 of 36 positions shown; findings below may reference images not displayed]

FINDINGS: Mediastinal lymph nodes are not enlarged by CT size
criteria.  No hilar or axillary adenopathy.  No right axillary
mass.  Heart size normal.  No pericardial effusion.

With the exception of scattered scarring, the lungs are clear.  No
pleural fluid.  Airway is unremarkable.

Incidental imaging of the upper abdomen shows no acute finding.  No
worrisome lytic or sclerotic lesions.
IMPRESSION: No acute findings.  No right axillary mass, as suggested in the
clinical history.

## 2009-03-03 ENCOUNTER — Encounter (HOSPITAL_COMMUNITY): Admission: RE | Admit: 2009-03-03 | Discharge: 2009-05-11 | Payer: Self-pay | Admitting: Oncology

## 2009-03-17 ENCOUNTER — Ambulatory Visit: Payer: Self-pay | Admitting: *Deleted

## 2009-05-06 IMAGING — CR DG LUMBAR SPINE 2-3V
2 series · 2 of 2 positions shown · non-contrast
Comparison: None available.

CLINICAL DATA: Patient for L5-S1 discectomy.  Preoperative film.

LUMBAR SPINE - 2-3 VIEW

[view not recorded (1 of 2)]
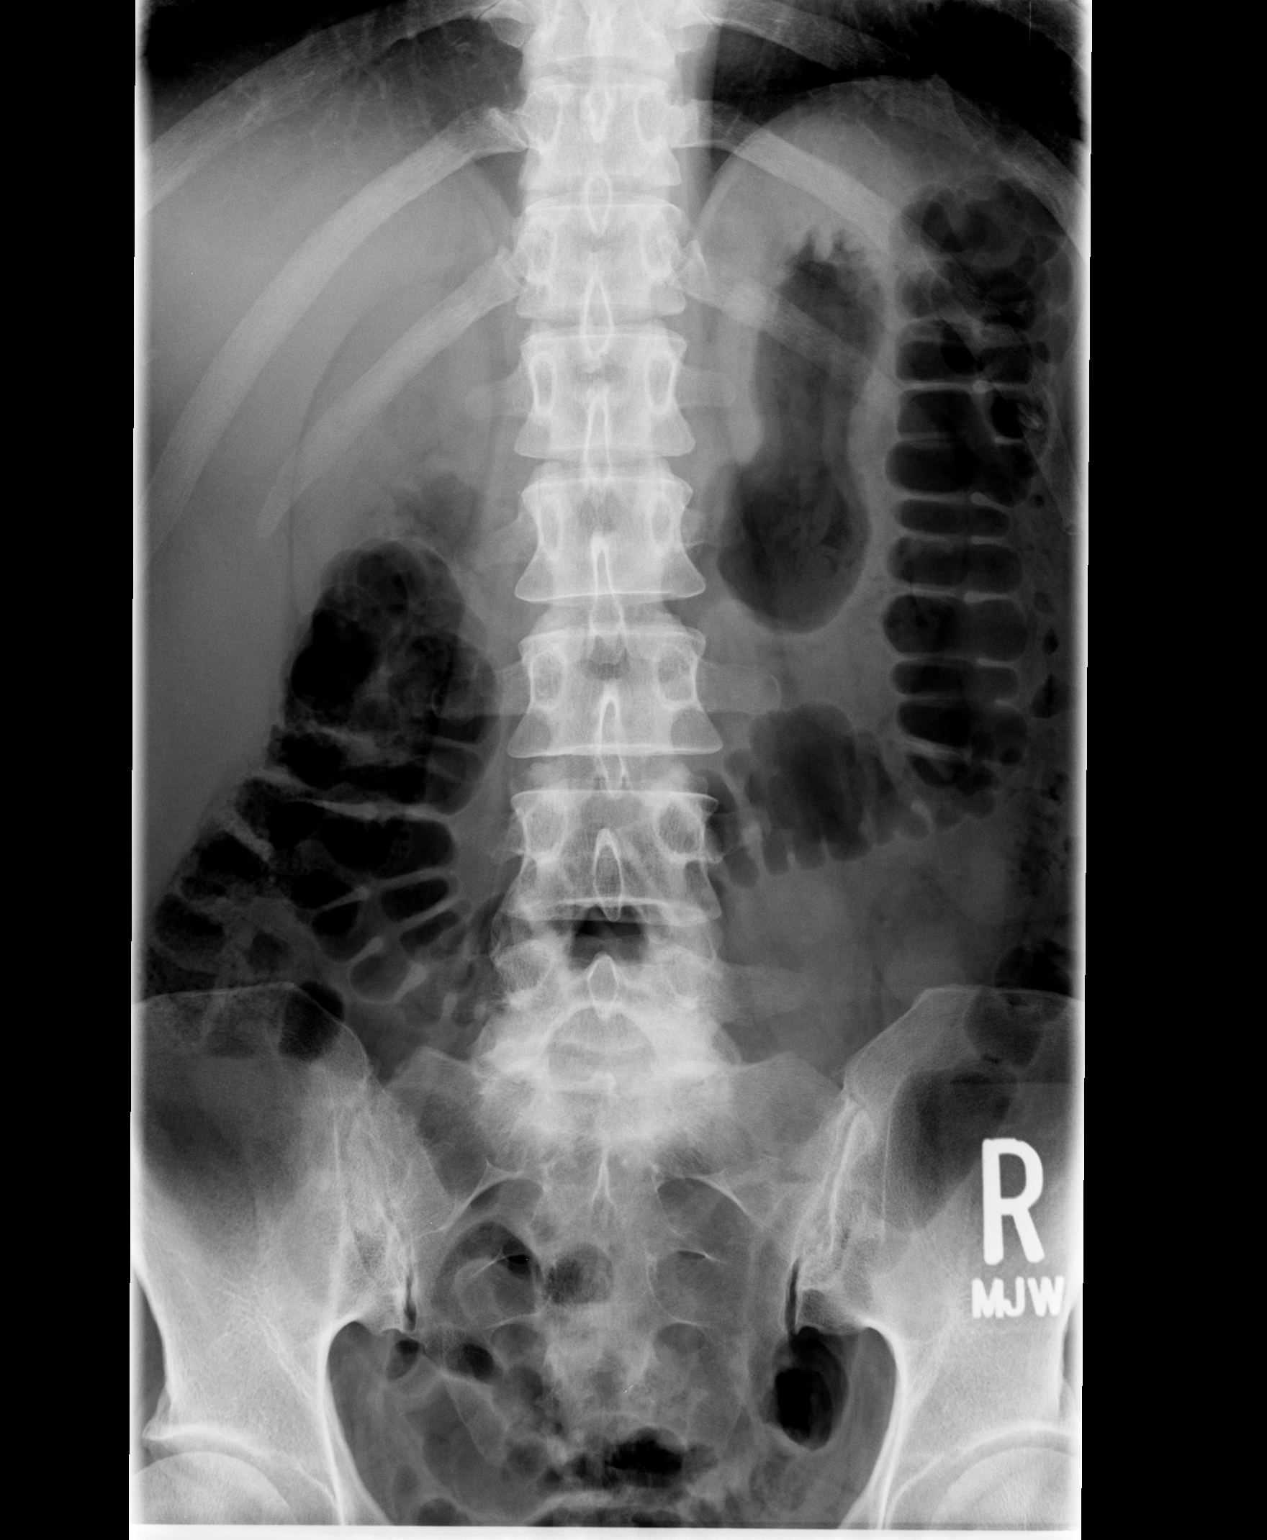

[view not recorded (2 of 2)]
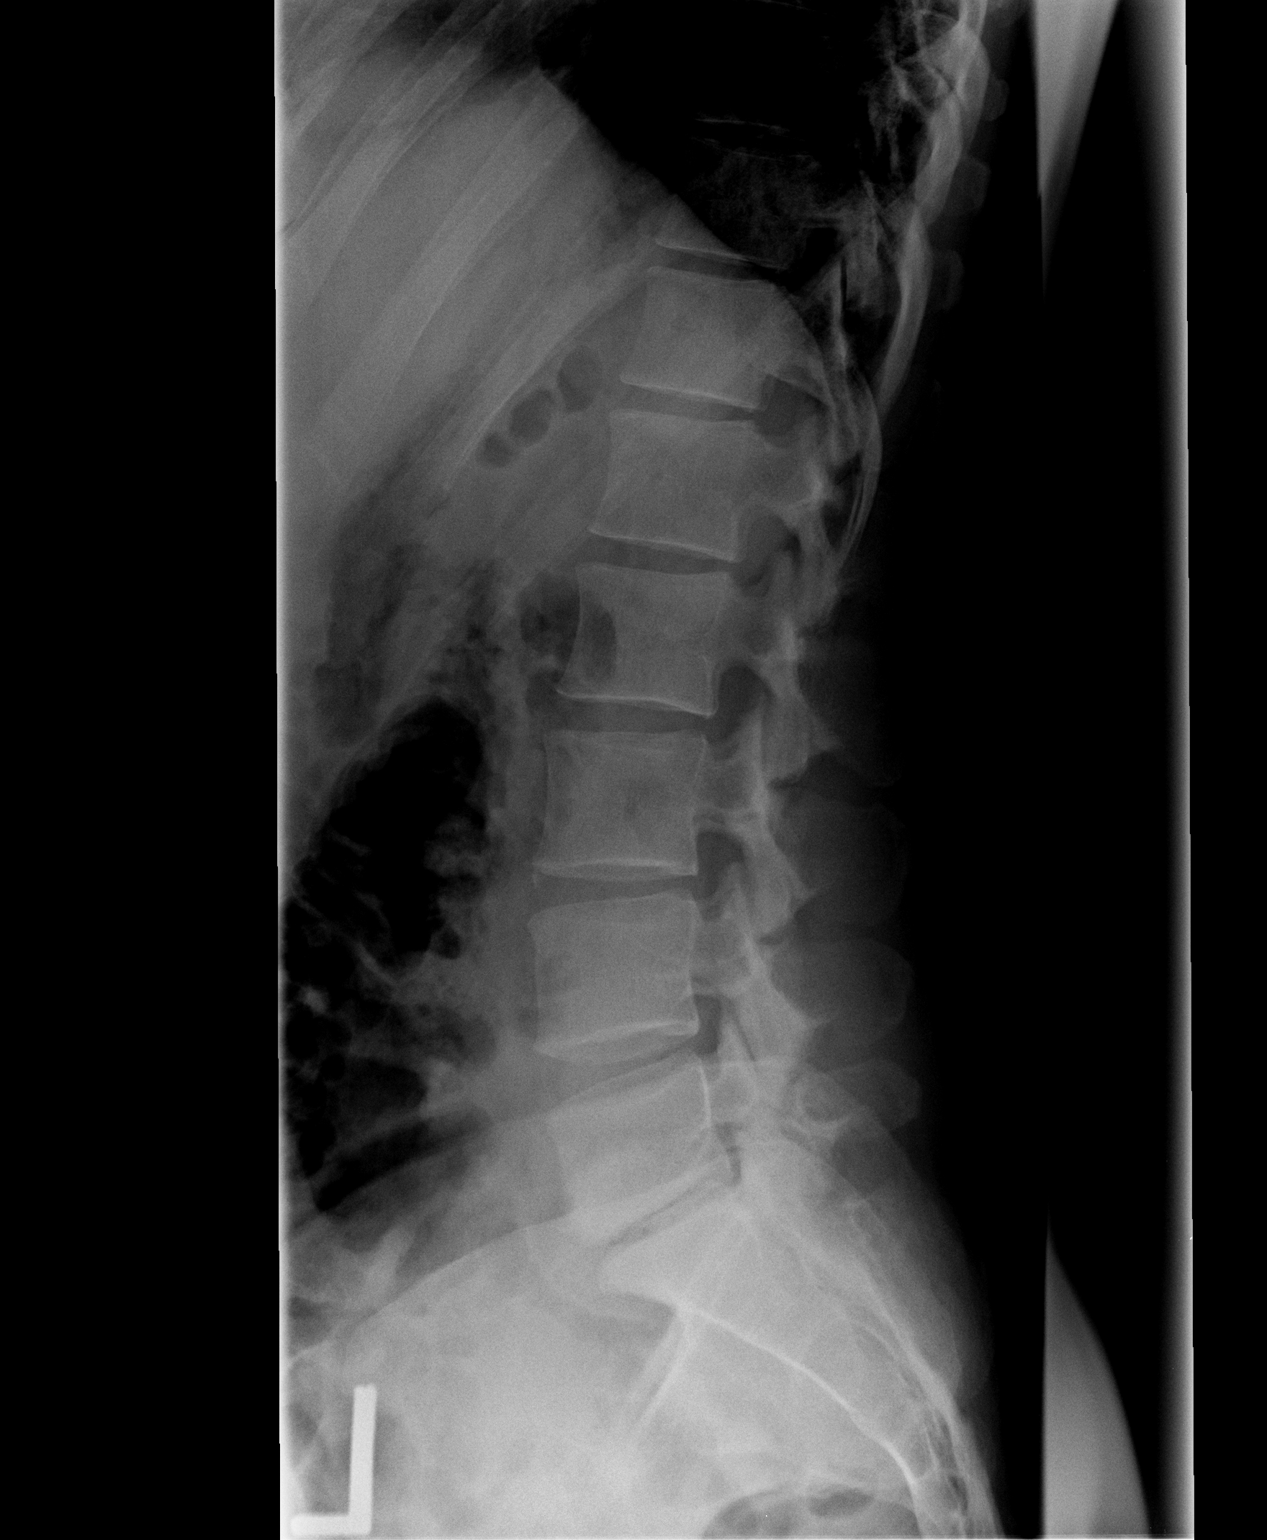

[2 of 2 positions shown; findings below may reference images not displayed]

FINDINGS: Vertebral body height and alignment are maintained.
There is loss of disc space height at L5-S1.  Facet degenerative
change L4-5 and L5-S1 also noted.
IMPRESSION: 1.  No acute finding.
2.  Degenerative disease lower lumbar spine.

## 2009-05-06 IMAGING — CR DG CHEST 2V
2 series · 2 of 2 positions shown · non-contrast
Comparison: None

CLINICAL DATA: Pre admission study for degenerative disc disease.
No current chest complaints.  30-year pack history of smoking.

CHEST - 2 VIEW

[view not recorded (1 of 2)]
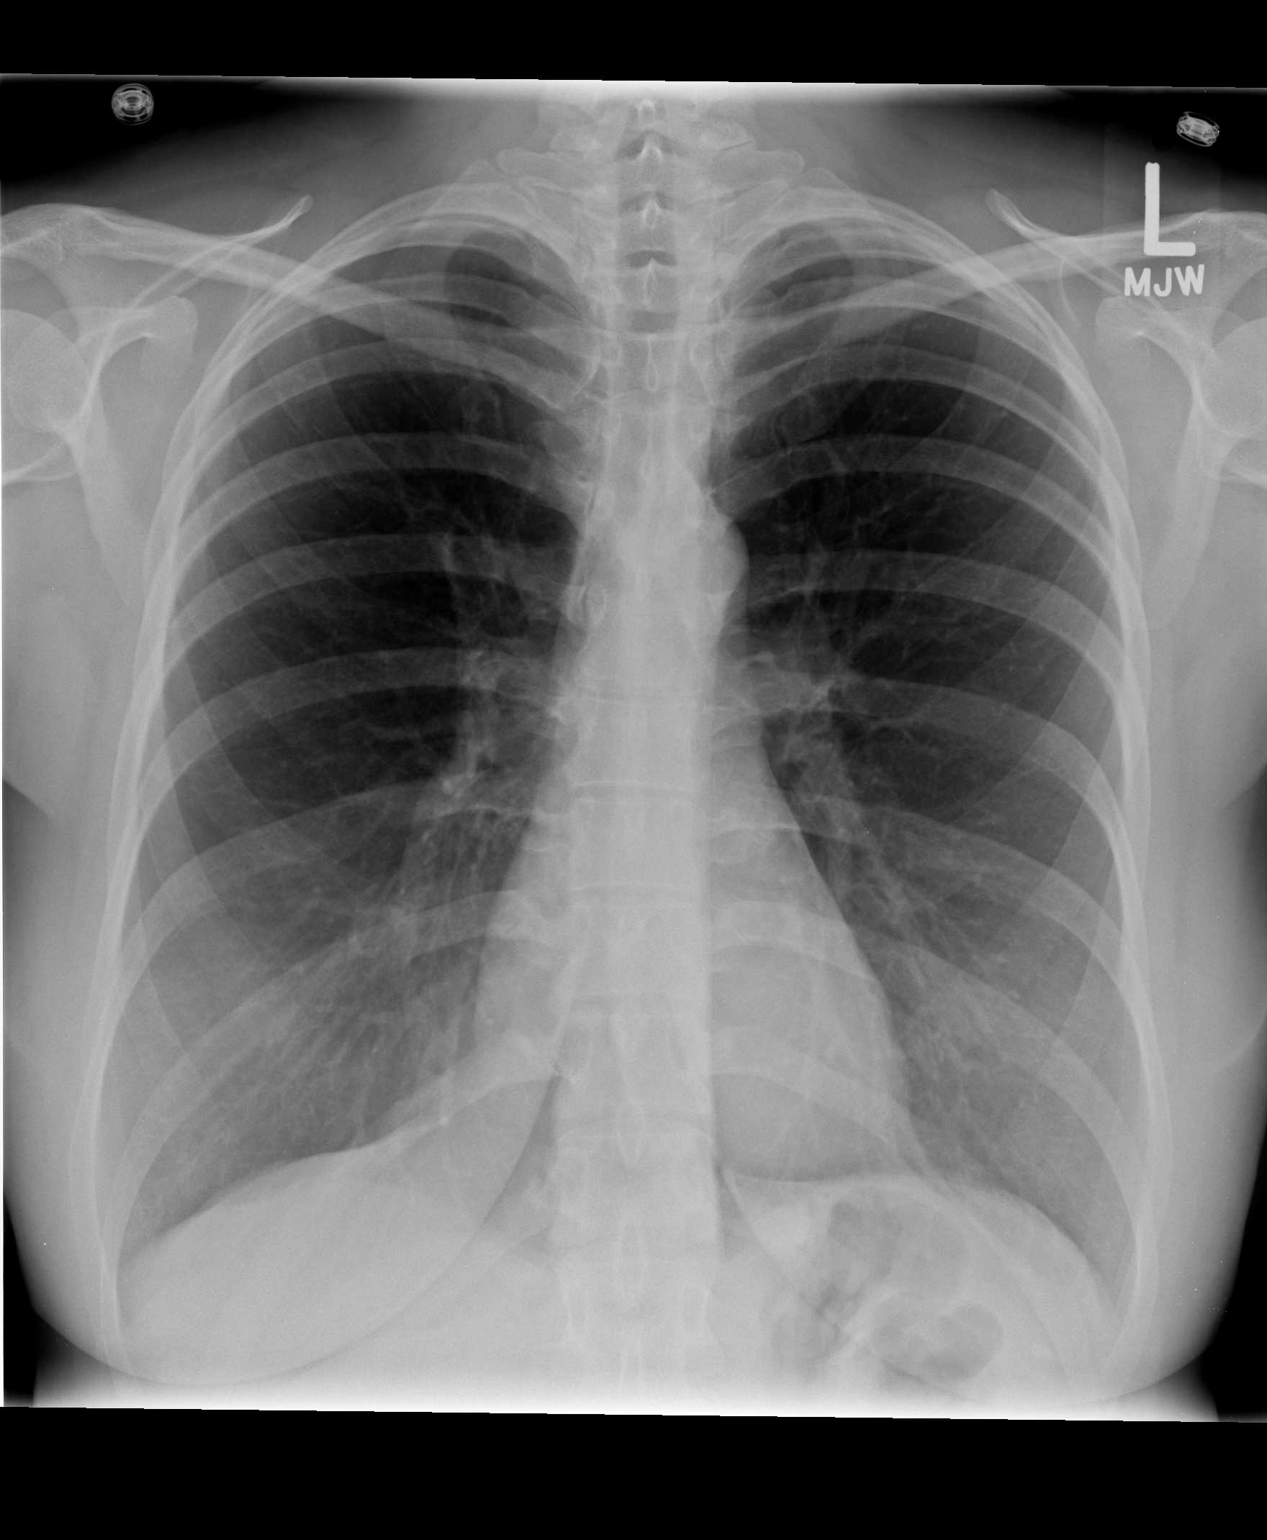

[view not recorded (2 of 2)]
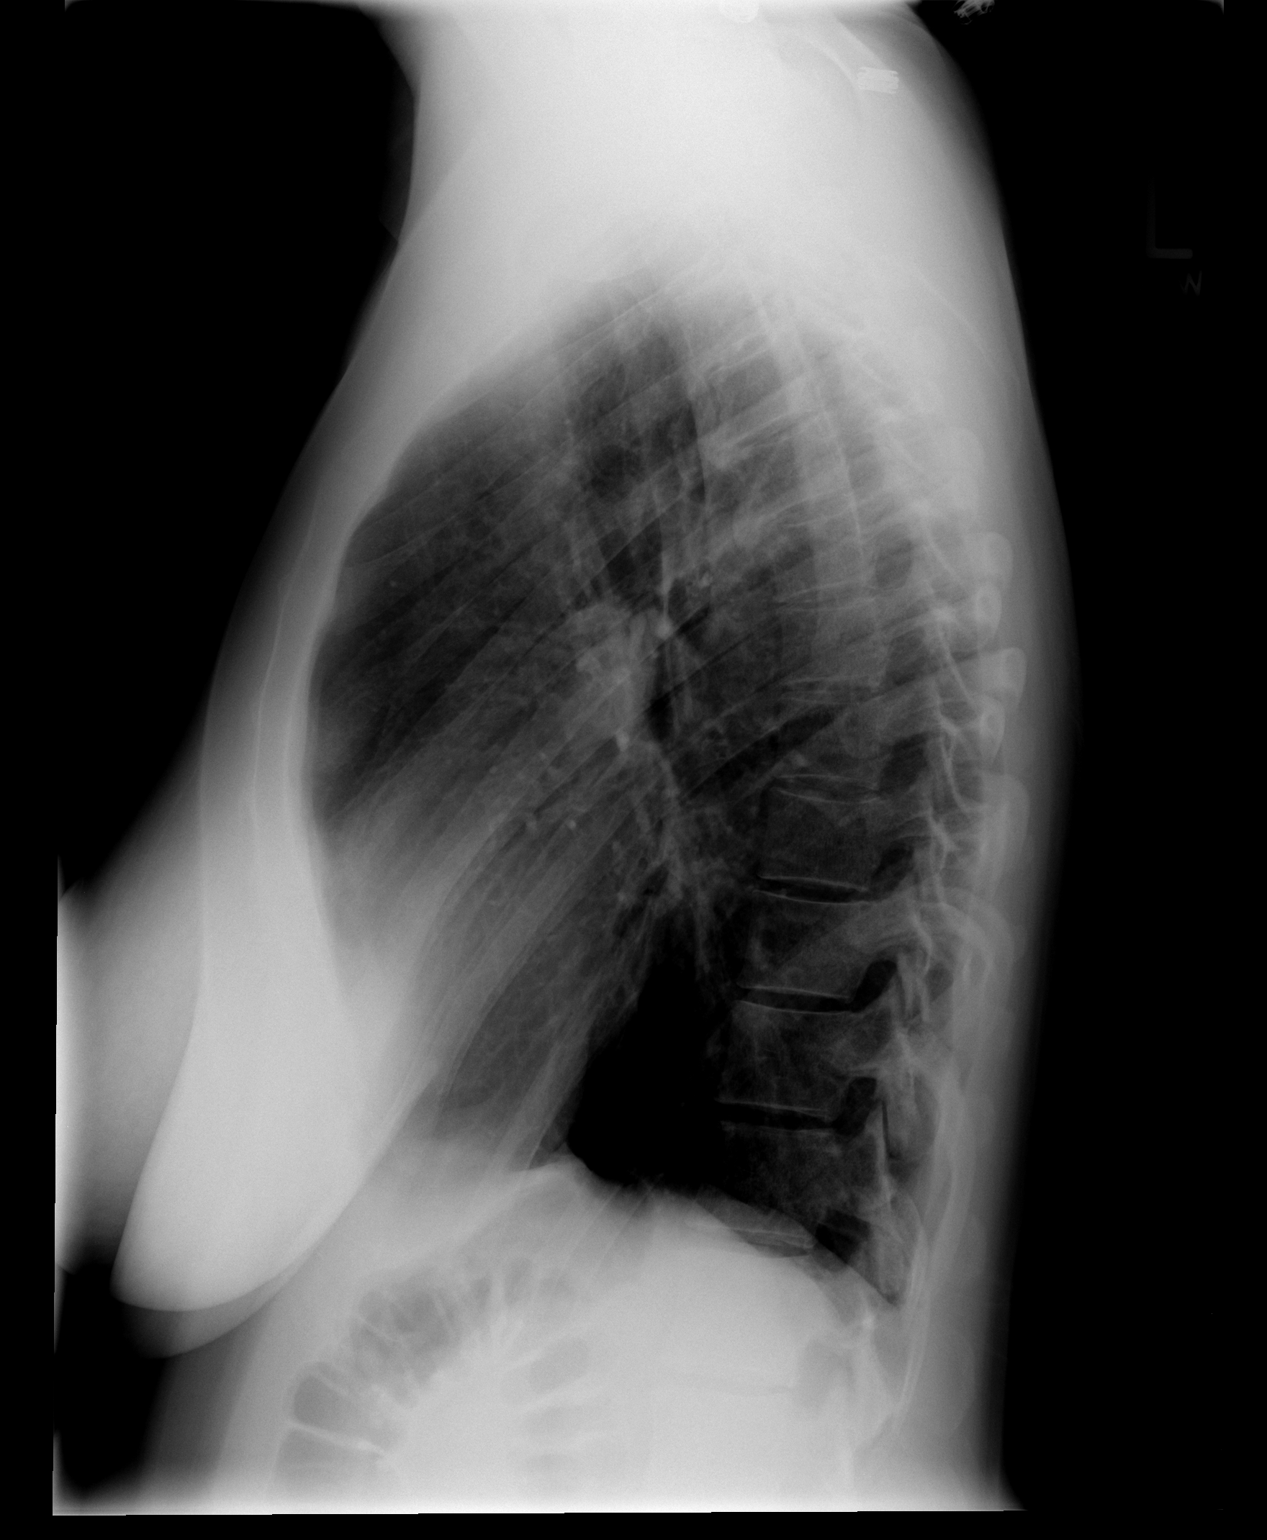

[2 of 2 positions shown; findings below may reference images not displayed]

FINDINGS: Heart and mediastinal contours are within normal limits.
The lung fields are clear with no evidence for focal infiltrate or
congestive failure.  Bony structures appear intact.
IMPRESSION: No acute cardiopulmonary disease noted

## 2009-05-11 ENCOUNTER — Inpatient Hospital Stay (HOSPITAL_COMMUNITY): Admission: RE | Admit: 2009-05-11 | Discharge: 2009-05-13 | Payer: Self-pay | Admitting: Specialist

## 2009-05-11 IMAGING — CR DG OR LOCAL ABDOMEN
1 series · 1 of 1 positions shown · non-contrast
Comparison: [DATE]

CLINICAL DATA: Instrument count

OR LOCAL ABDOMEN

[AP]
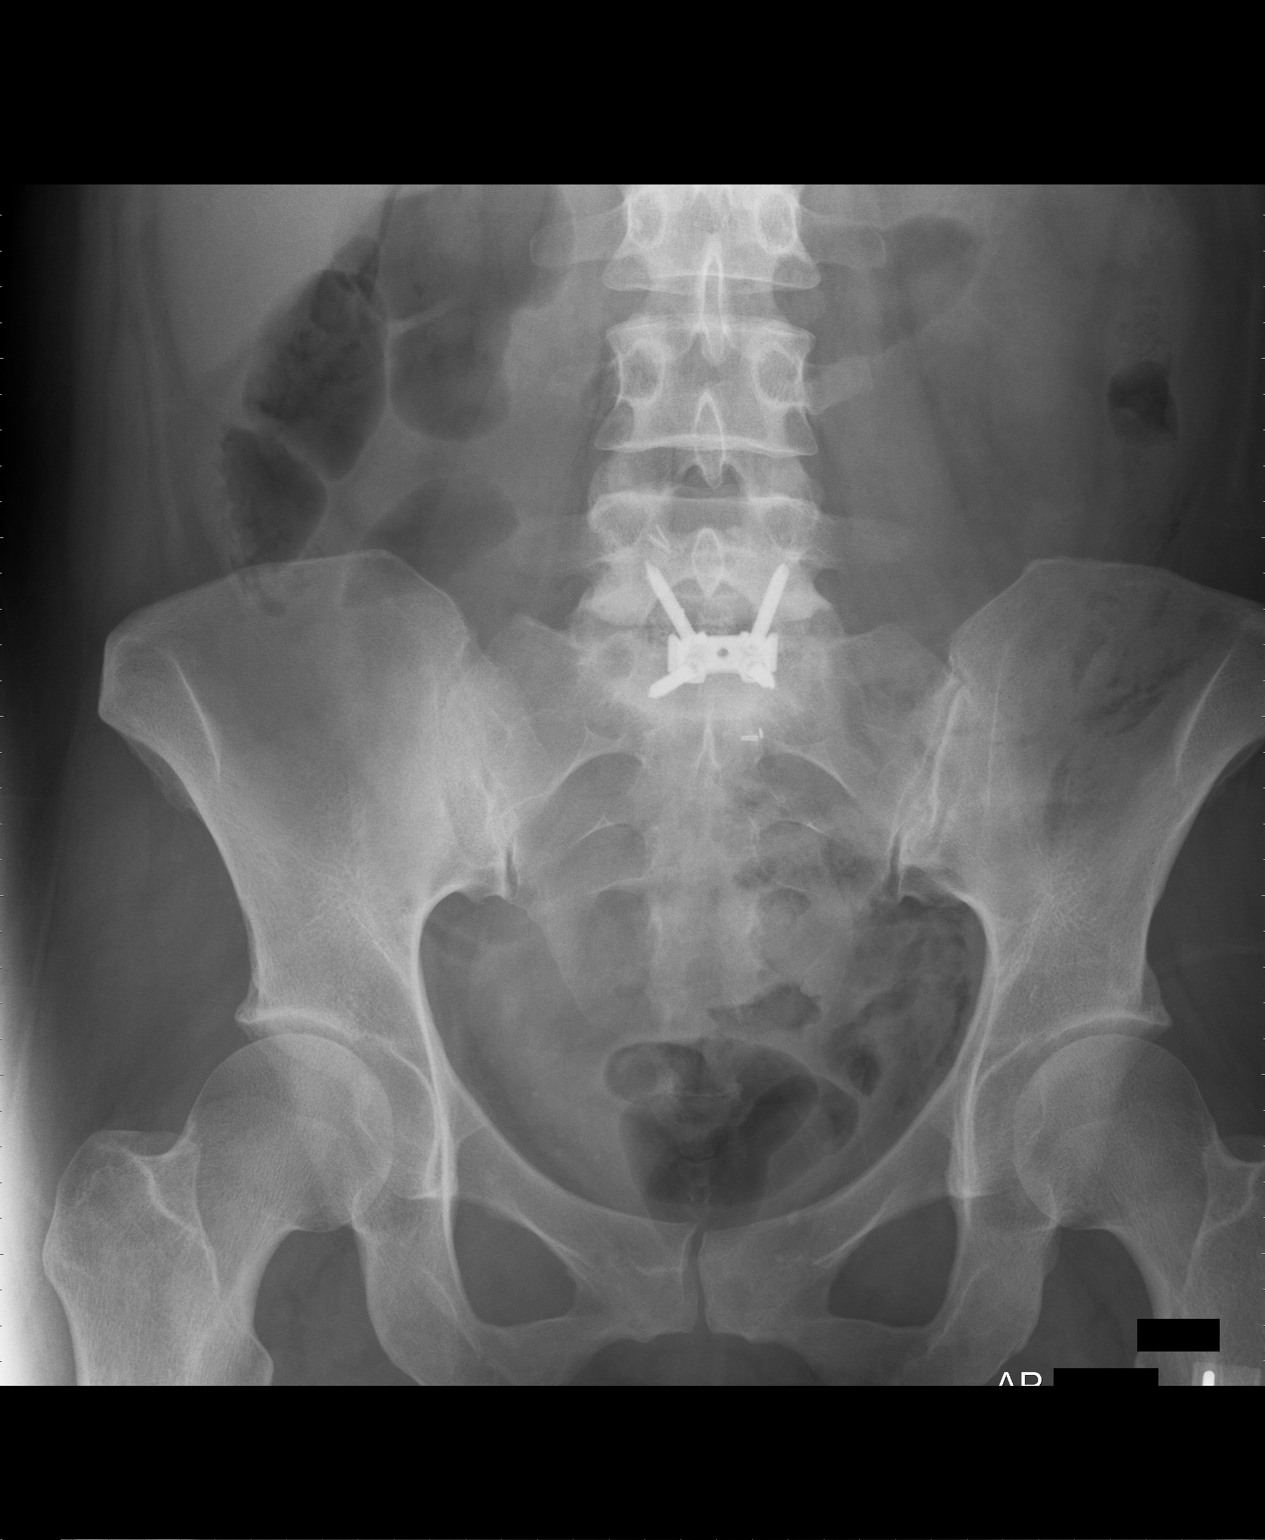

[1 of 1 positions shown; findings below may reference images not displayed]

FINDINGS: Status post L5-S1 fusion.  No retained instruments are
noted.  There are surgical clips projecting over the upper left
sacrum. These were not present on the preoperative lumbar series.
IMPRESSION: 1.  Post lumbar fusion.
2.  Surgical clips project over the upper left sacrum - new since
[DATE].
[DATE].  No retained instruments.

## 2009-05-11 IMAGING — CR DG CHEST 1V PORT
1 series · 1 of 1 positions shown · non-contrast
Comparison: [DATE]

CLINICAL DATA: Central line placement postop

PORTABLE CHEST - 1 VIEW

[view not recorded]
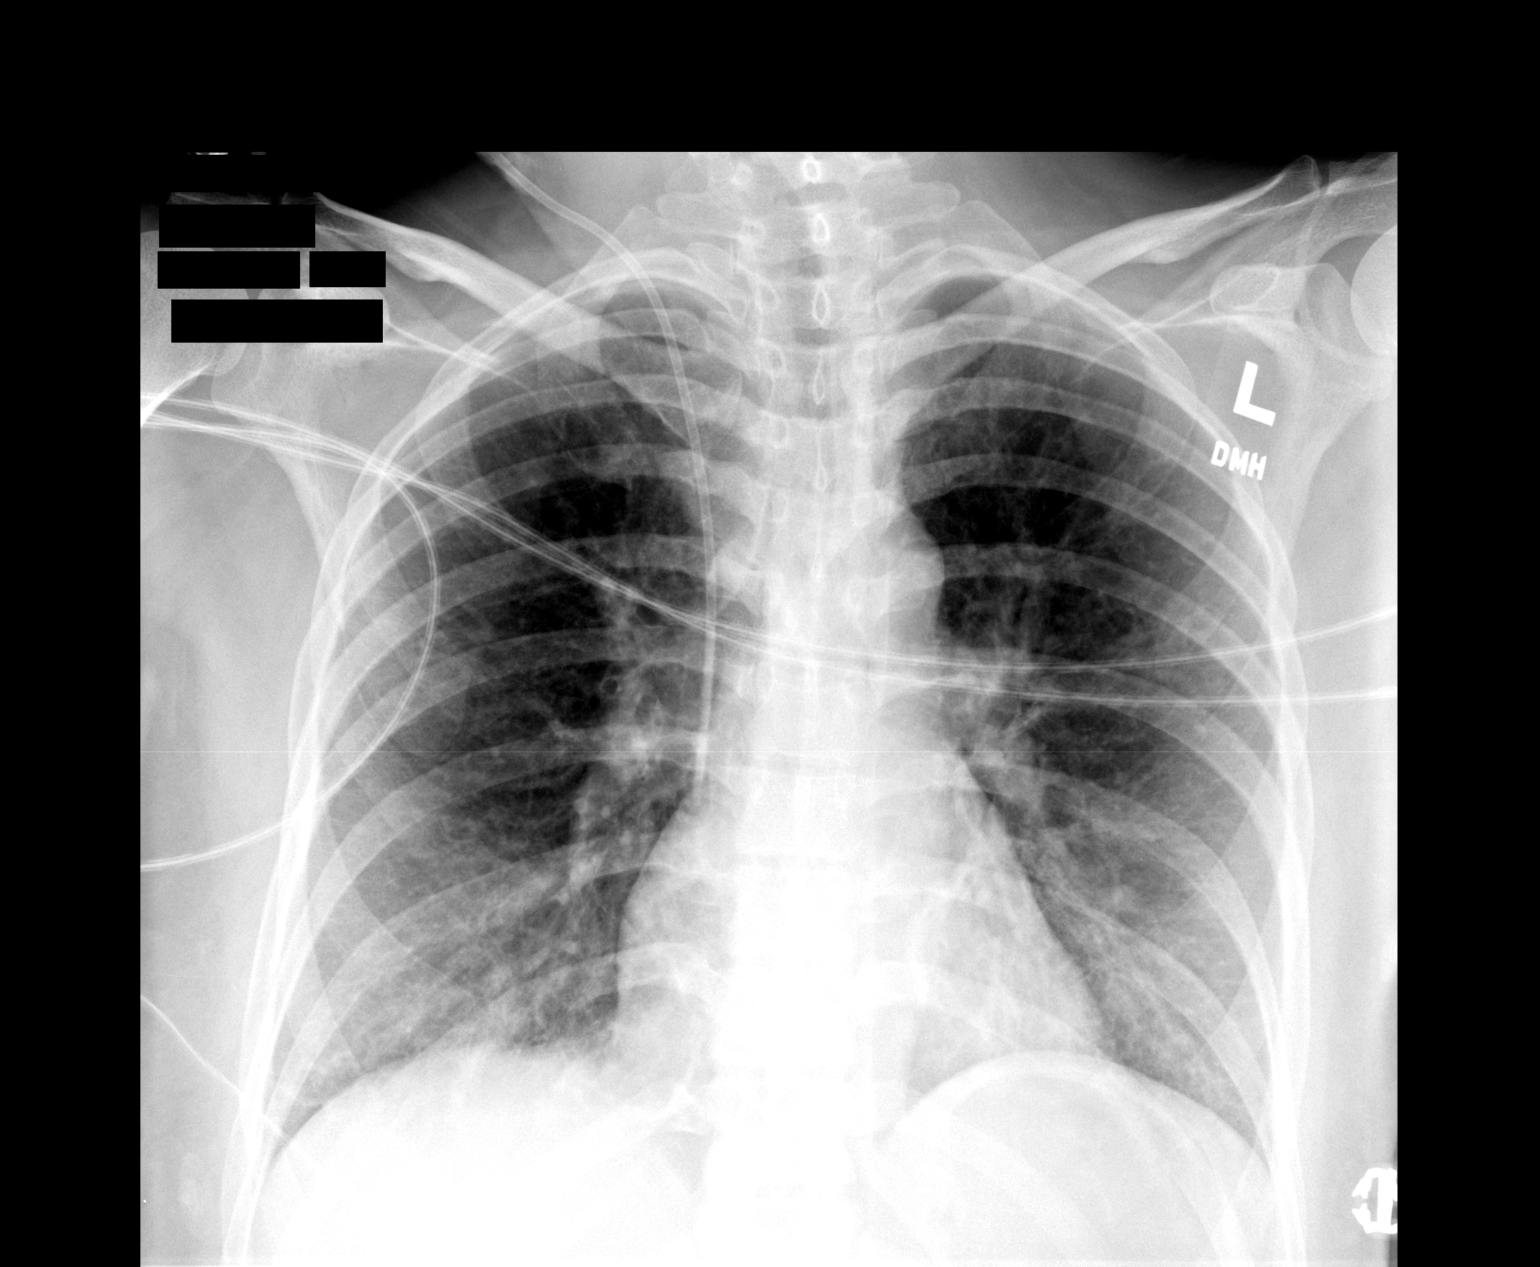

[1 of 1 positions shown; findings below may reference images not displayed]

FINDINGS: CVC has been inserted via right jugular vein approach.
Catheter tip is in the lower SVC.  No pneumothorax.  Minimal
atelectasis or infiltrate at the right base.
IMPRESSION: CVC is in the lower SVC.  No pneumothorax.

## 2009-05-11 IMAGING — RF DG LUMBAR SPINE 2-3V
1 series · 2 of 2 positions shown · non-contrast
Comparison: [DATE]

CLINICAL DATA: L5-S1 anterior lumbar interbody fusion.

LUMBAR SPINE - 2-3 VIEW

[Series 1: run · 2 of 2 slices shown]
[im 1/2]
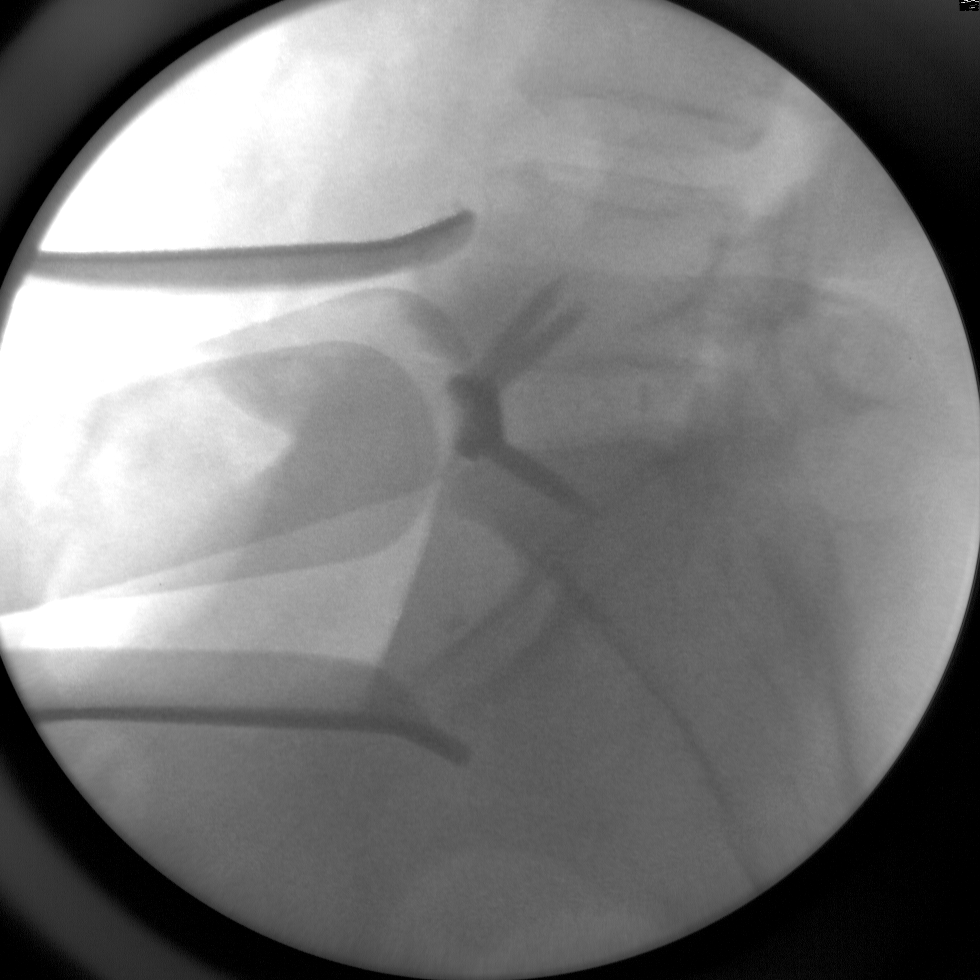
[im 2/2]
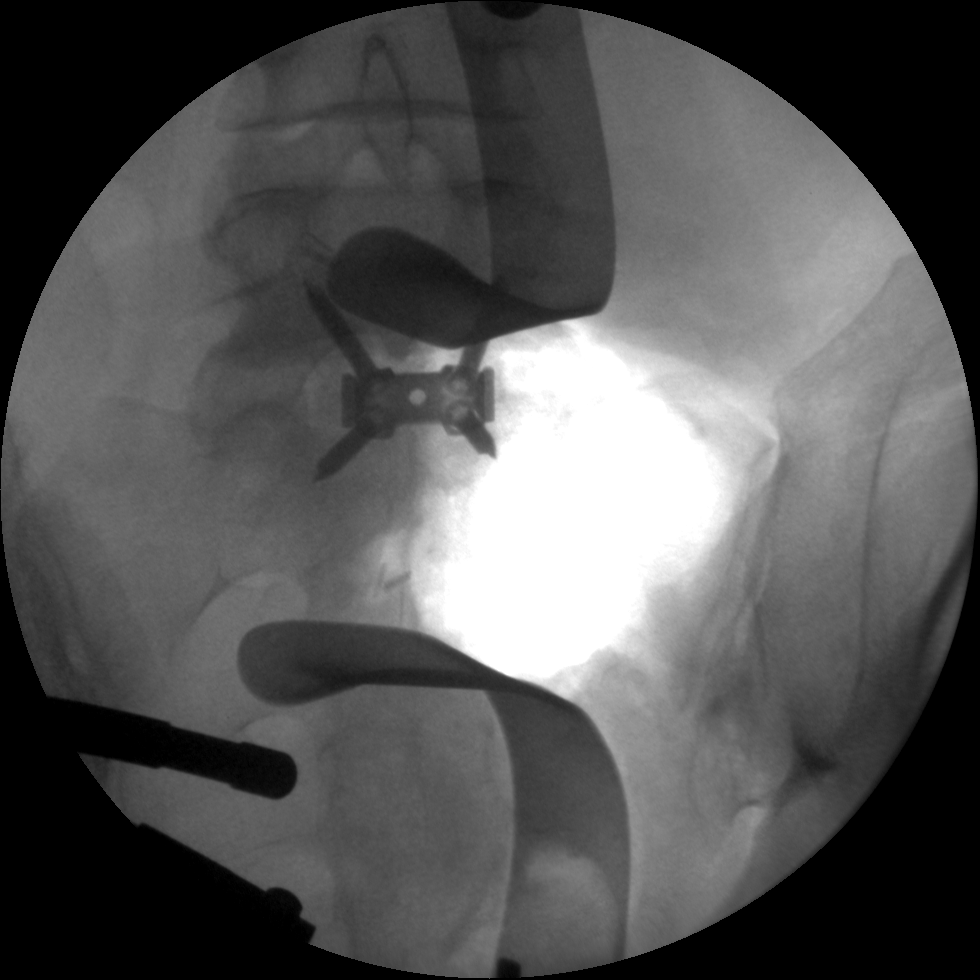

[2 of 2 positions shown; findings below may reference images not displayed]

FINDINGS: Two intraoperative fluoroscopic spot views of the
lumbosacral junction are submitted.  There is L5-S1 anterior lumbar
interbody fusion.
IMPRESSION: Intraoperative views of L5-S1 anterior lumbar interbody fusion.

## 2009-05-12 ENCOUNTER — Ambulatory Visit: Payer: Self-pay | Admitting: Vascular Surgery

## 2009-05-12 ENCOUNTER — Encounter (INDEPENDENT_AMBULATORY_CARE_PROVIDER_SITE_OTHER): Payer: Self-pay | Admitting: Specialist

## 2009-06-08 ENCOUNTER — Ambulatory Visit: Payer: Self-pay | Admitting: Oncology

## 2009-06-13 LAB — COMPREHENSIVE METABOLIC PANEL
ALT: 14 U/L (ref 0–35)
AST: 11 U/L (ref 0–37)
Albumin: 4.5 g/dL (ref 3.5–5.2)
Alkaline Phosphatase: 63 U/L (ref 39–117)
BUN: 4 mg/dL — ABNORMAL LOW (ref 6–23)
Calcium: 9.4 mg/dL (ref 8.4–10.5)
Chloride: 106 mEq/L (ref 96–112)
Potassium: 4 mEq/L (ref 3.5–5.3)

## 2009-06-13 LAB — CBC WITH DIFFERENTIAL/PLATELET
Basophils Absolute: 0 10*3/uL (ref 0.0–0.1)
Eosinophils Absolute: 0.1 10*3/uL (ref 0.0–0.5)
HGB: 14.1 g/dL (ref 11.6–15.9)
MONO#: 0.5 10*3/uL (ref 0.1–0.9)
NEUT#: 2.1 10*3/uL (ref 1.5–6.5)
RBC: 4.26 10*6/uL (ref 3.70–5.45)
RDW: 13.3 % (ref 11.2–14.5)
WBC: 4.5 10*3/uL (ref 3.9–10.3)
lymph#: 1.7 10*3/uL (ref 0.9–3.3)

## 2009-06-13 LAB — MORPHOLOGY
PLT EST: ADEQUATE
RBC Comments: NORMAL

## 2009-08-25 ENCOUNTER — Encounter (HOSPITAL_COMMUNITY): Admission: RE | Admit: 2009-08-25 | Discharge: 2009-11-11 | Payer: Self-pay | Admitting: Oncology

## 2009-08-30 ENCOUNTER — Ambulatory Visit (HOSPITAL_COMMUNITY): Admission: RE | Admit: 2009-08-30 | Discharge: 2009-08-30 | Payer: Self-pay | Admitting: Internal Medicine

## 2009-08-30 IMAGING — CR DG ANKLE COMPLETE 3+V*L*
2 series · 2 of 2 positions shown · non-contrast
Comparison: None

CLINICAL DATA: Pain after blunt trauma

LEFT ANKLE COMPLETE - 3+ VIEW

[view not recorded (1 of 2)]
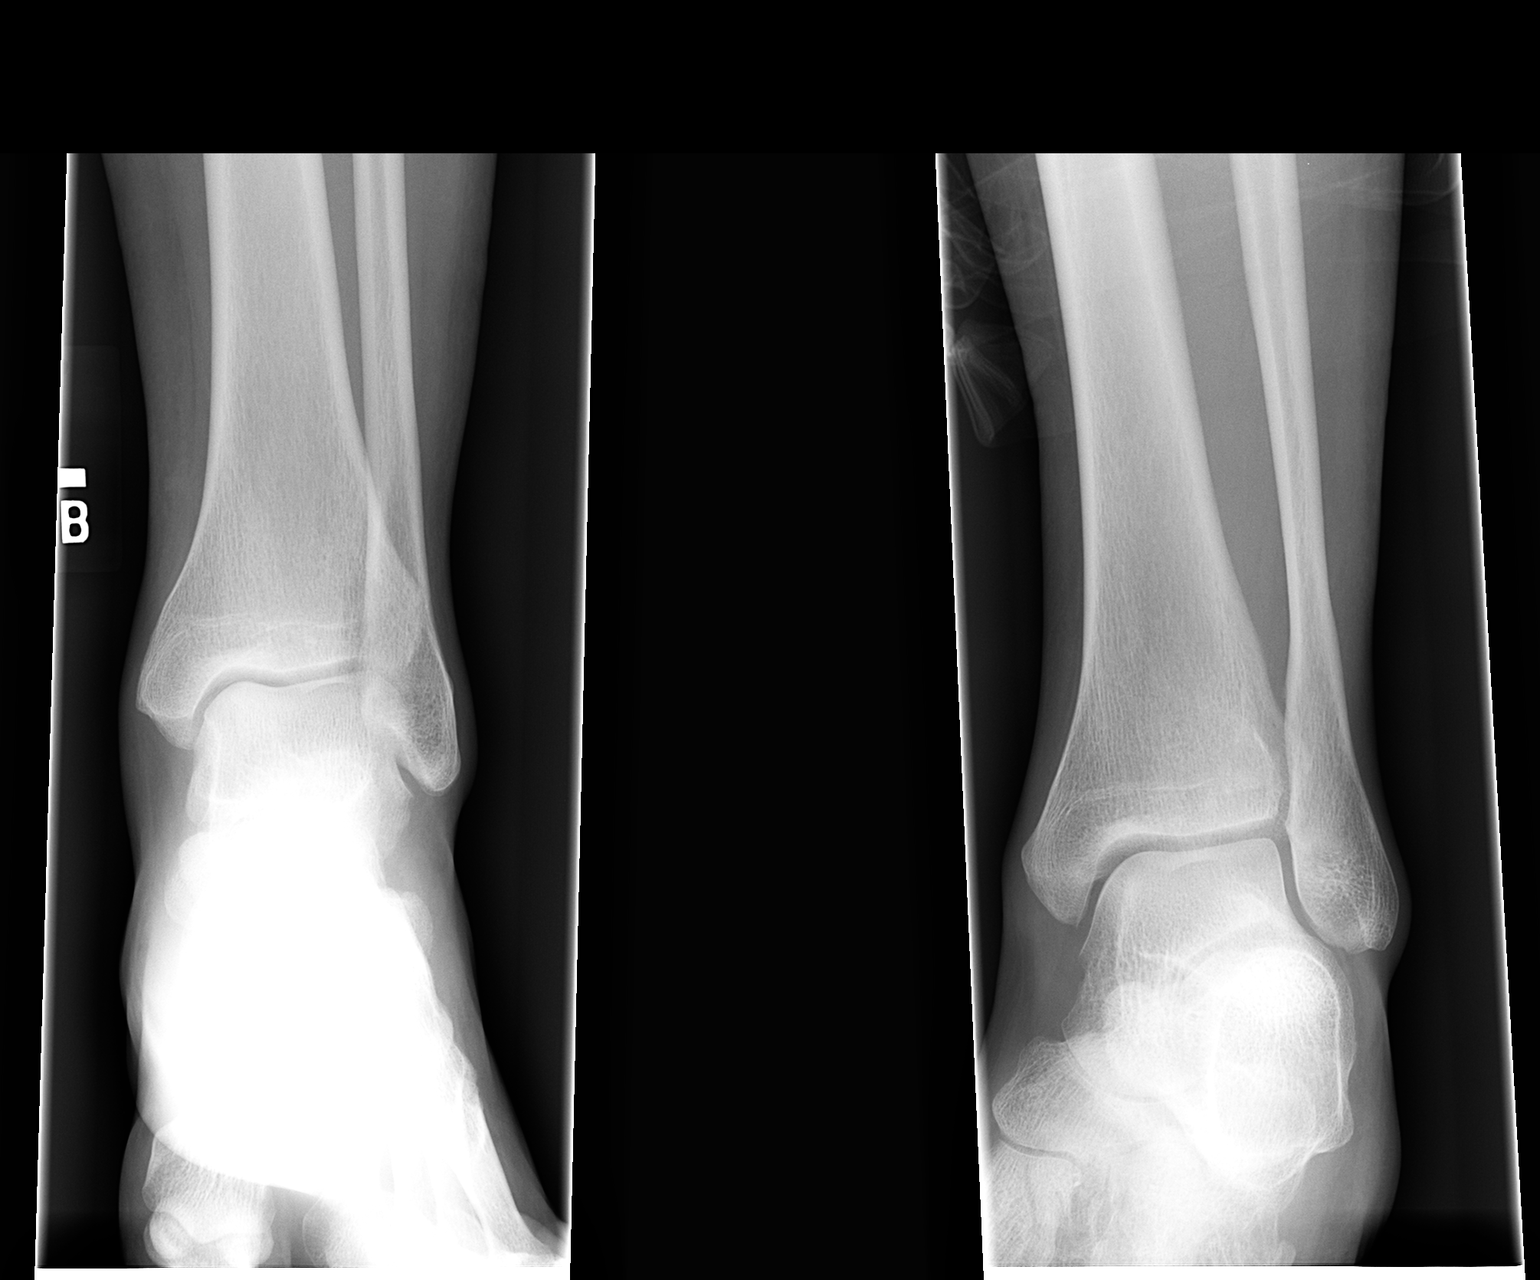

[view not recorded (2 of 2)]
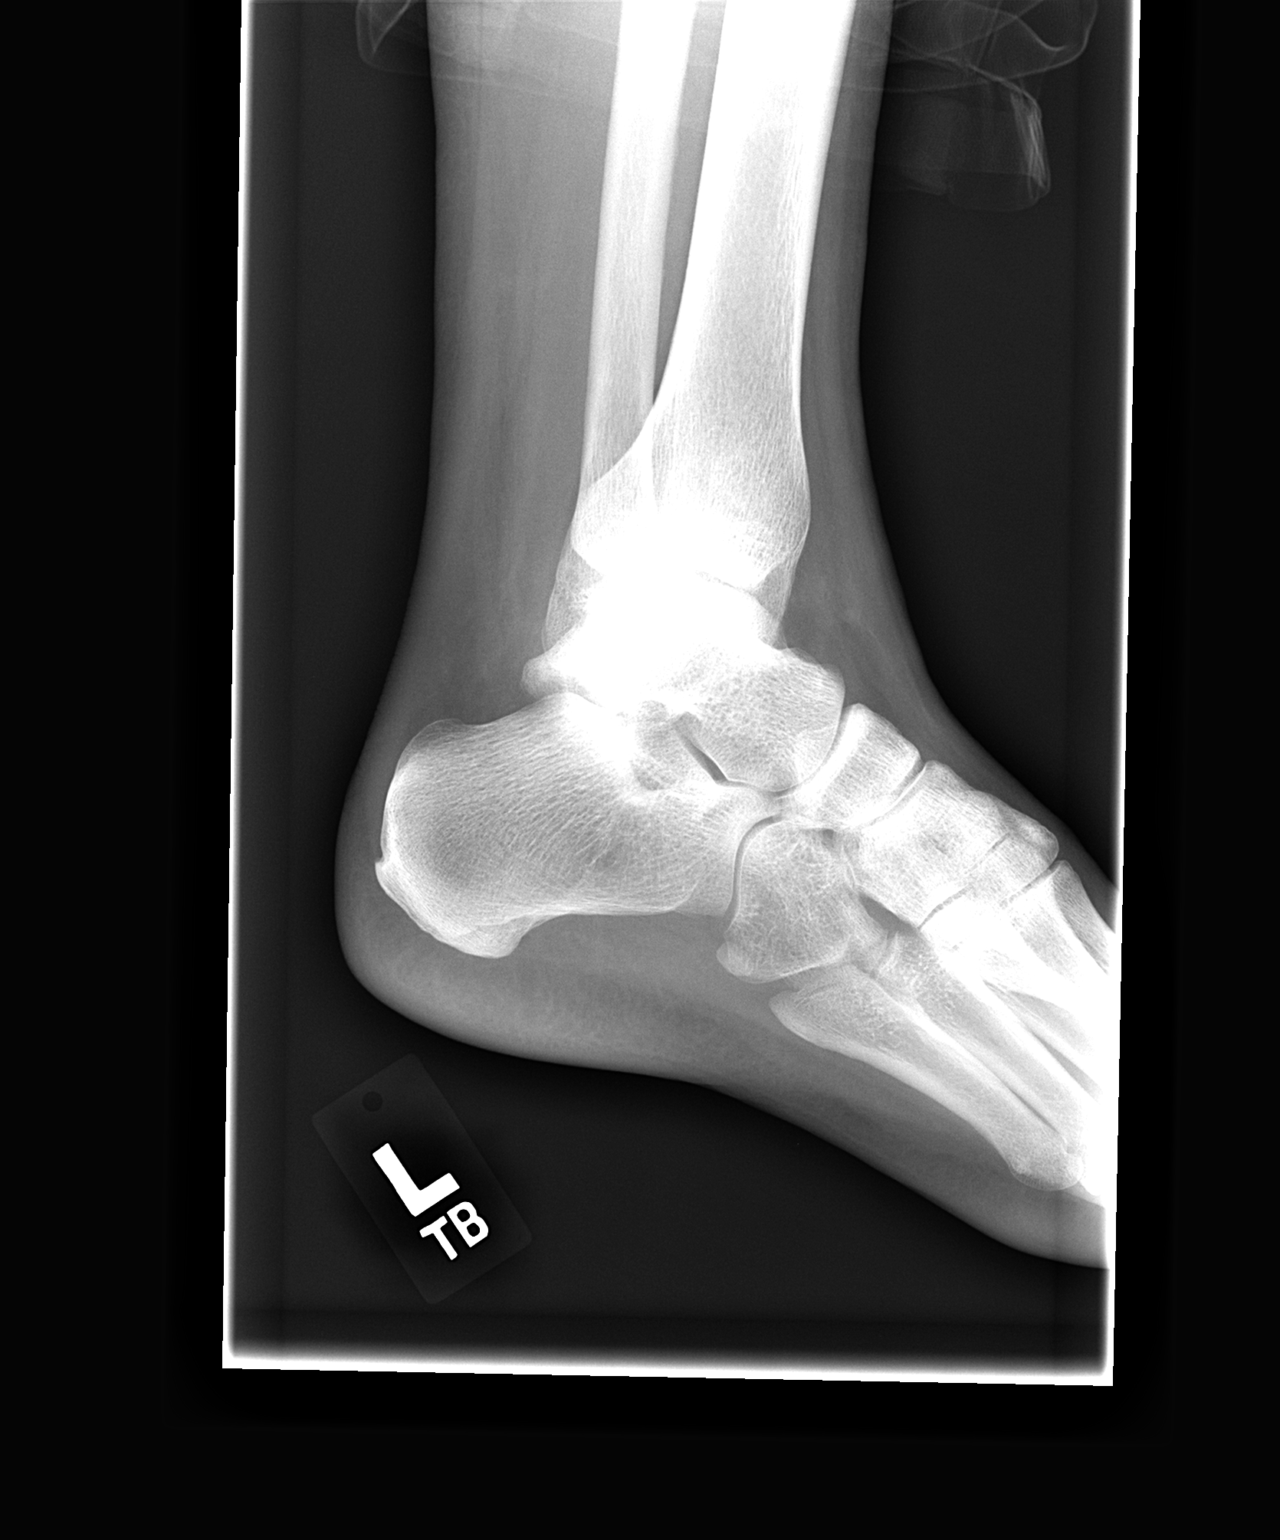

[2 of 2 positions shown; findings below may reference images not displayed]

FINDINGS: Tiny calcaneal spur near the insertion of the Achilles
tendon. Negative for fracture, dislocation, or other acute
abnormality.  Normal alignment and mineralization. No significant
degenerative change.  Regional soft tissues unremarkable.
IMPRESSION: Negative

## 2009-12-16 ENCOUNTER — Encounter (HOSPITAL_COMMUNITY): Admission: RE | Admit: 2009-12-16 | Discharge: 2010-02-22 | Payer: Self-pay | Admitting: Oncology

## 2010-01-23 ENCOUNTER — Encounter (INDEPENDENT_AMBULATORY_CARE_PROVIDER_SITE_OTHER): Payer: Self-pay | Admitting: *Deleted

## 2010-01-23 ENCOUNTER — Ambulatory Visit (HOSPITAL_COMMUNITY): Admission: RE | Admit: 2010-01-23 | Discharge: 2010-01-23 | Payer: Self-pay | Admitting: Family Medicine

## 2010-01-23 IMAGING — CT CT ABD-PELV W/ CM
2 of 5 series · 16 of 46 positions shown, 18 images · IV contrast (Omnipaque 300)
Comparison: None.

CLINICAL DATA: Lower abdominal pain with fever and tenderness.

CT ABDOMEN AND PELVIS WITH CONTRAST
TECHNIQUE: Multidetector CT imaging of the abdomen and pelvis was
performed following the standard protocol during bolus
administration of intravenous contrast.
Contrast: 100 ml [9F]

[Series 2: abd_pel_with 5.0 b40f · axial · 0.61mm/px · z∈[-478,-88]mm · 13 of 88 slices shown, 15 images]
[im 5/88  soft-tissue]
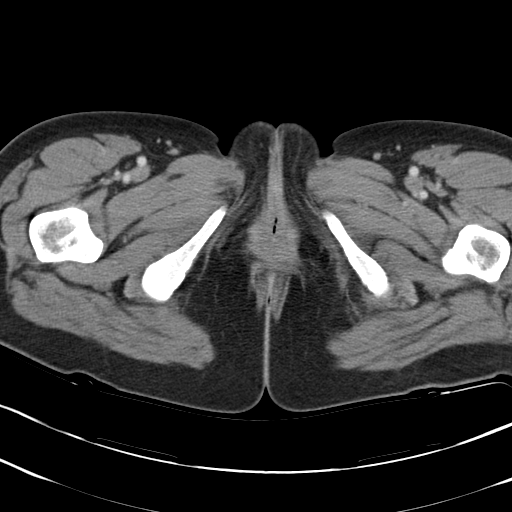
[im 5/88  bone]
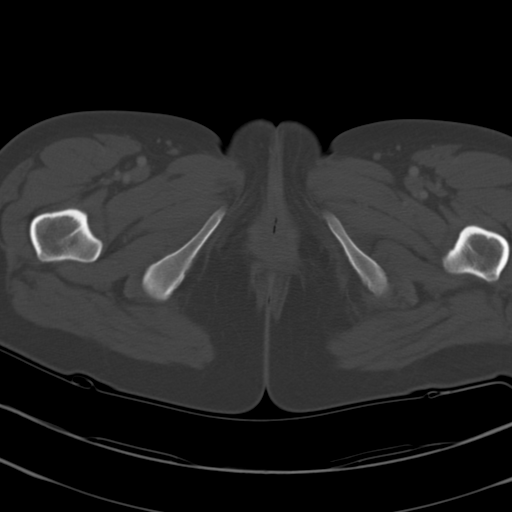
[im 14/88  soft-tissue]
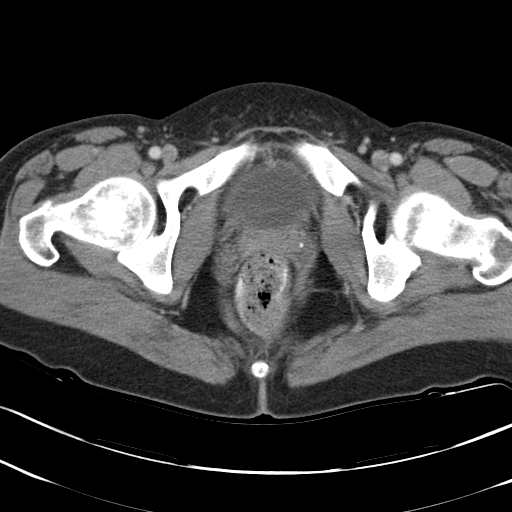
[im 19/88  soft-tissue]
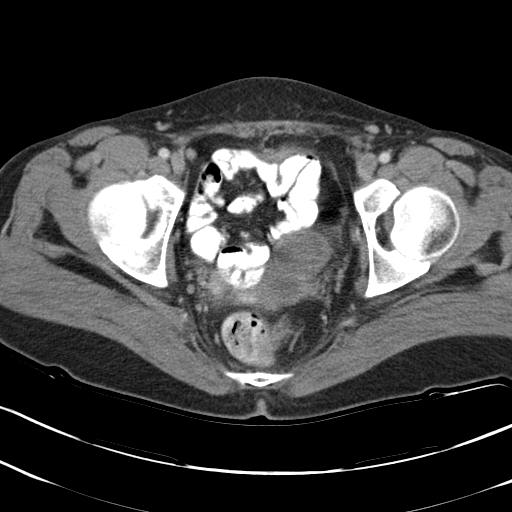
[im 23/88  soft-tissue]
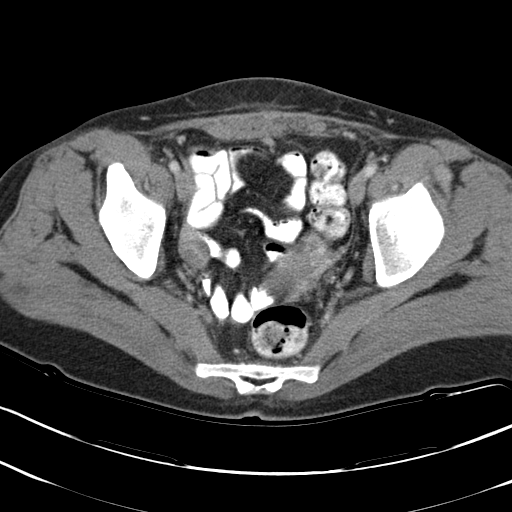
[im 33/88  soft-tissue]
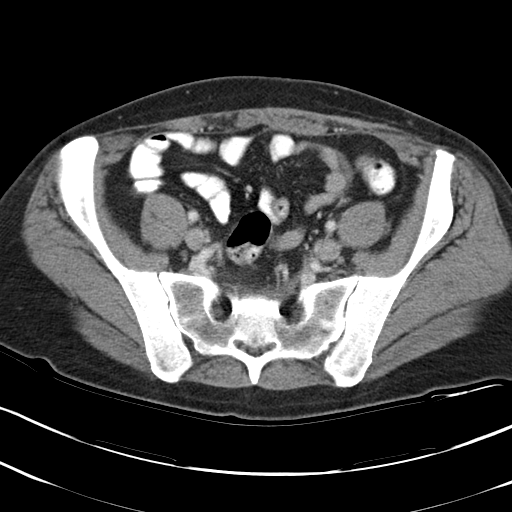
[im 37/88  soft-tissue]
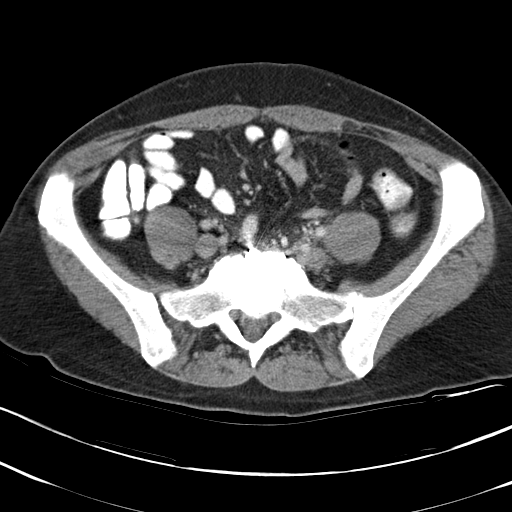
[im 46/88  soft-tissue]
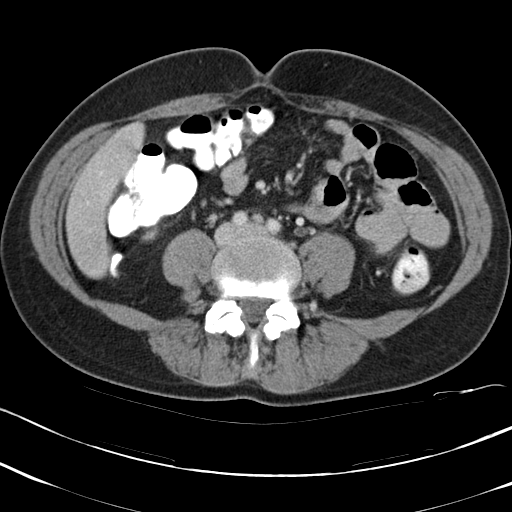
[im 51/88  soft-tissue]
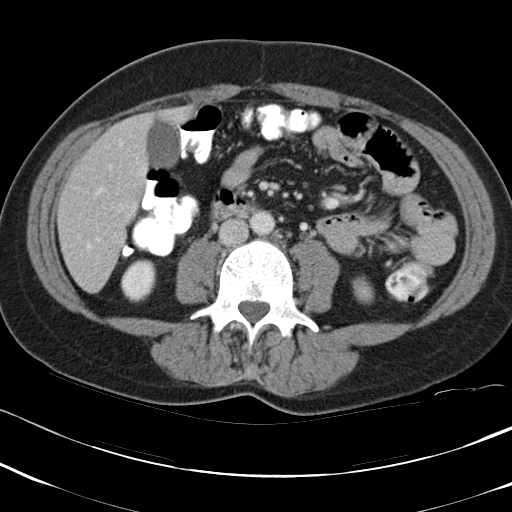
[im 55/88  soft-tissue]
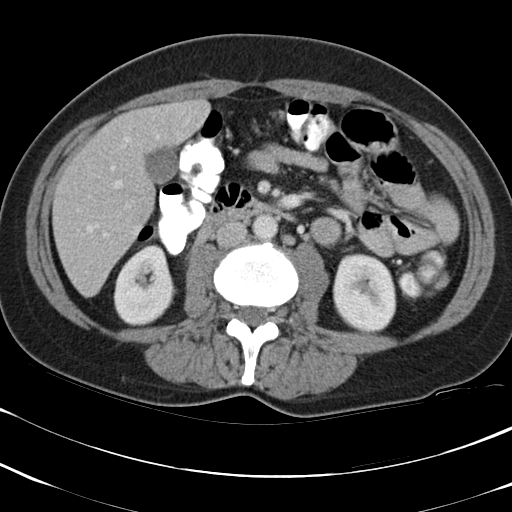
[im 55/88  bone]
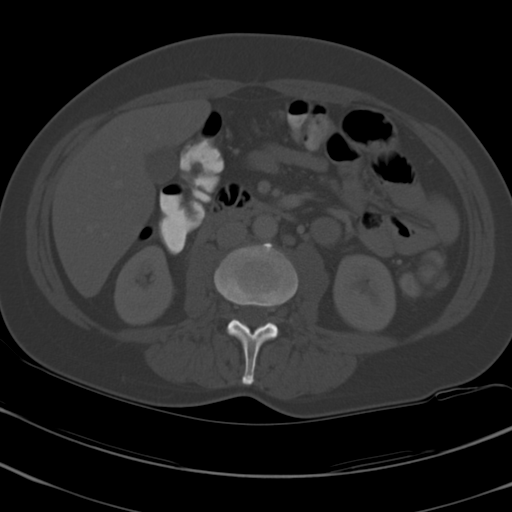
[im 65/88  soft-tissue]
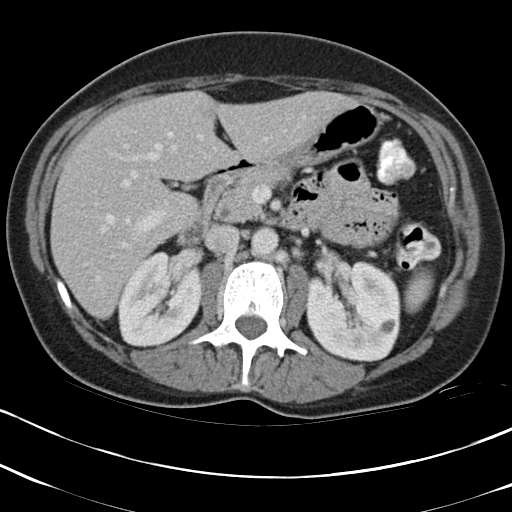
[im 69/88  soft-tissue]
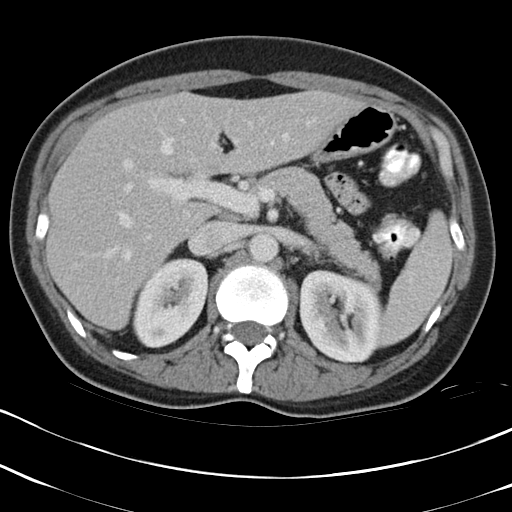
[im 74/88  soft-tissue]
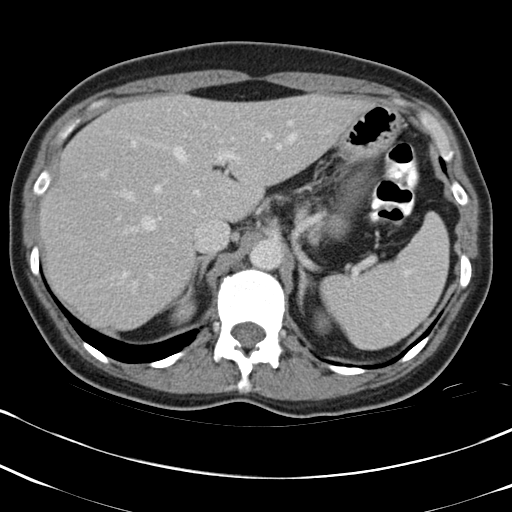
[im 83/88  soft-tissue]
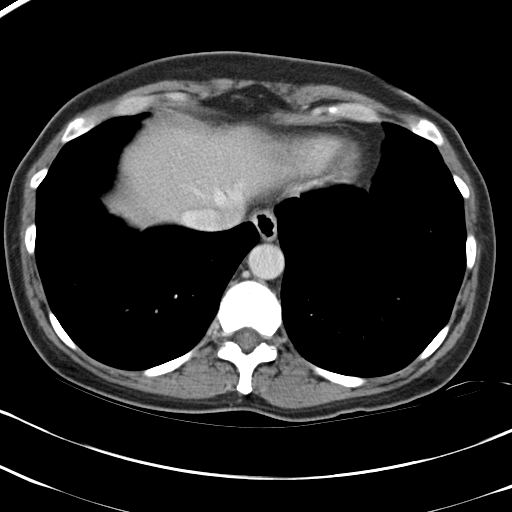

[Series 4: mpr cor post contrast (id) · coronal · 0.73mm/px · 3 of 68 slices shown]
[im 23/68  soft-tissue]
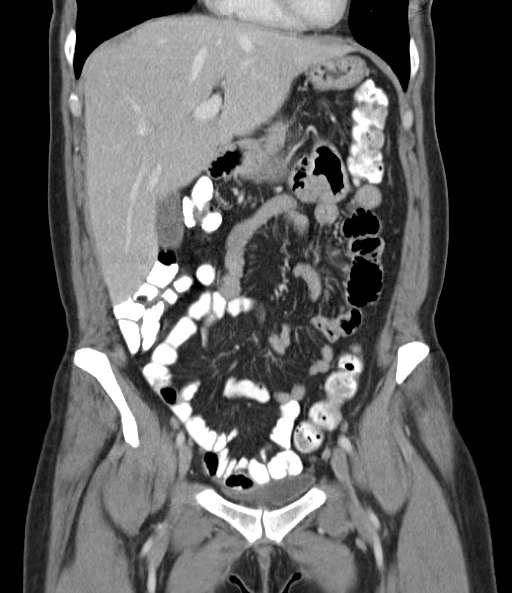
[im 30/68  soft-tissue]
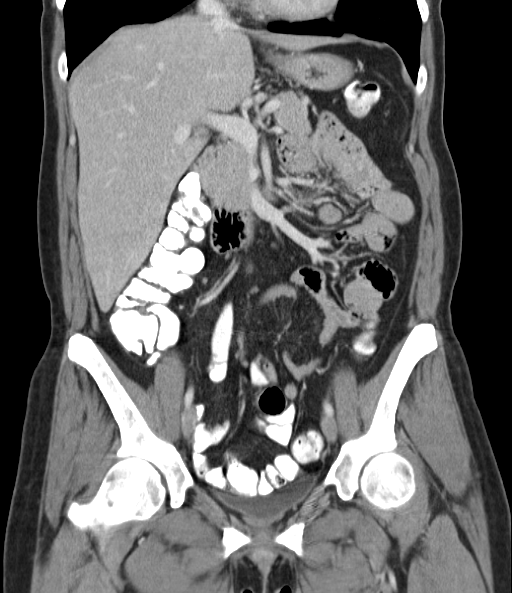
[im 38/68  soft-tissue]
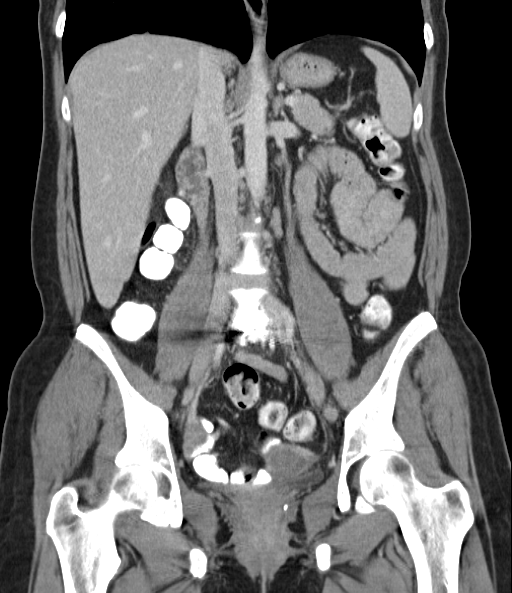

[16 of 46 positions shown; findings below may reference images not displayed]

FINDINGS: Lung bases show mild subpleural atelectasis and/or
scarring.  Heart size normal.  No pericardial or pleural effusion.
Distal esophagus is mildly dilated.

Liver, gallbladder, adrenal glands and right kidney are
unremarkable.  A 1 cm low attenuation lesion in the left kidney is
likely a cyst.  Circumaortic left renal vein.  Spleen, pancreas,
stomach and bowel are unremarkable.

Hysterectomy. There are 2 low attenuation lesions in the left
adnexa, which likely arise from the left ovary, which is not seen
as a separate structure.  The larger of the two lesions measures
3.4 cm and 23 HU.  The slightly smaller of the two is lower in
density, and measures 2.9 cm.  Right ovary is visualized.  A left
external iliac lymph node is borderline enlarged, measuring 10 mm
in short axis.  No worrisome lytic or sclerotic lesions.
Degenerative changes are seen in the spine.
IMPRESSION: 1.  Cystic lesions in the left adnexa likely arise from the left
ovary, and may represent a combination of hemorrhagic follicle/cyst
and a dominant follicle.  Follow-up could be performed with
ultrasound in 6-12 weeks, as clinically indicated.
2.  Borderline enlarged left external iliac lymph node is
nonspecific.

## 2010-01-25 ENCOUNTER — Telehealth: Payer: Self-pay | Admitting: Gastroenterology

## 2010-01-30 ENCOUNTER — Ambulatory Visit: Payer: Self-pay | Admitting: Gastroenterology

## 2010-01-30 DIAGNOSIS — K589 Irritable bowel syndrome without diarrhea: Secondary | ICD-10-CM

## 2010-01-30 DIAGNOSIS — R1032 Left lower quadrant pain: Secondary | ICD-10-CM

## 2010-01-31 ENCOUNTER — Telehealth: Payer: Self-pay | Admitting: Gastroenterology

## 2010-02-03 ENCOUNTER — Telehealth: Payer: Self-pay | Admitting: Gastroenterology

## 2010-02-13 ENCOUNTER — Ambulatory Visit (HOSPITAL_COMMUNITY): Admission: RE | Admit: 2010-02-13 | Discharge: 2010-02-13 | Payer: Self-pay | Admitting: Family Medicine

## 2010-02-13 IMAGING — CR DG CHEST 2V
2 series · 2 of 2 positions shown · non-contrast
Comparison: [DATE]

CLINICAL DATA: 15/night sweats

CHEST - 2 VIEW

[view not recorded (1 of 2)]
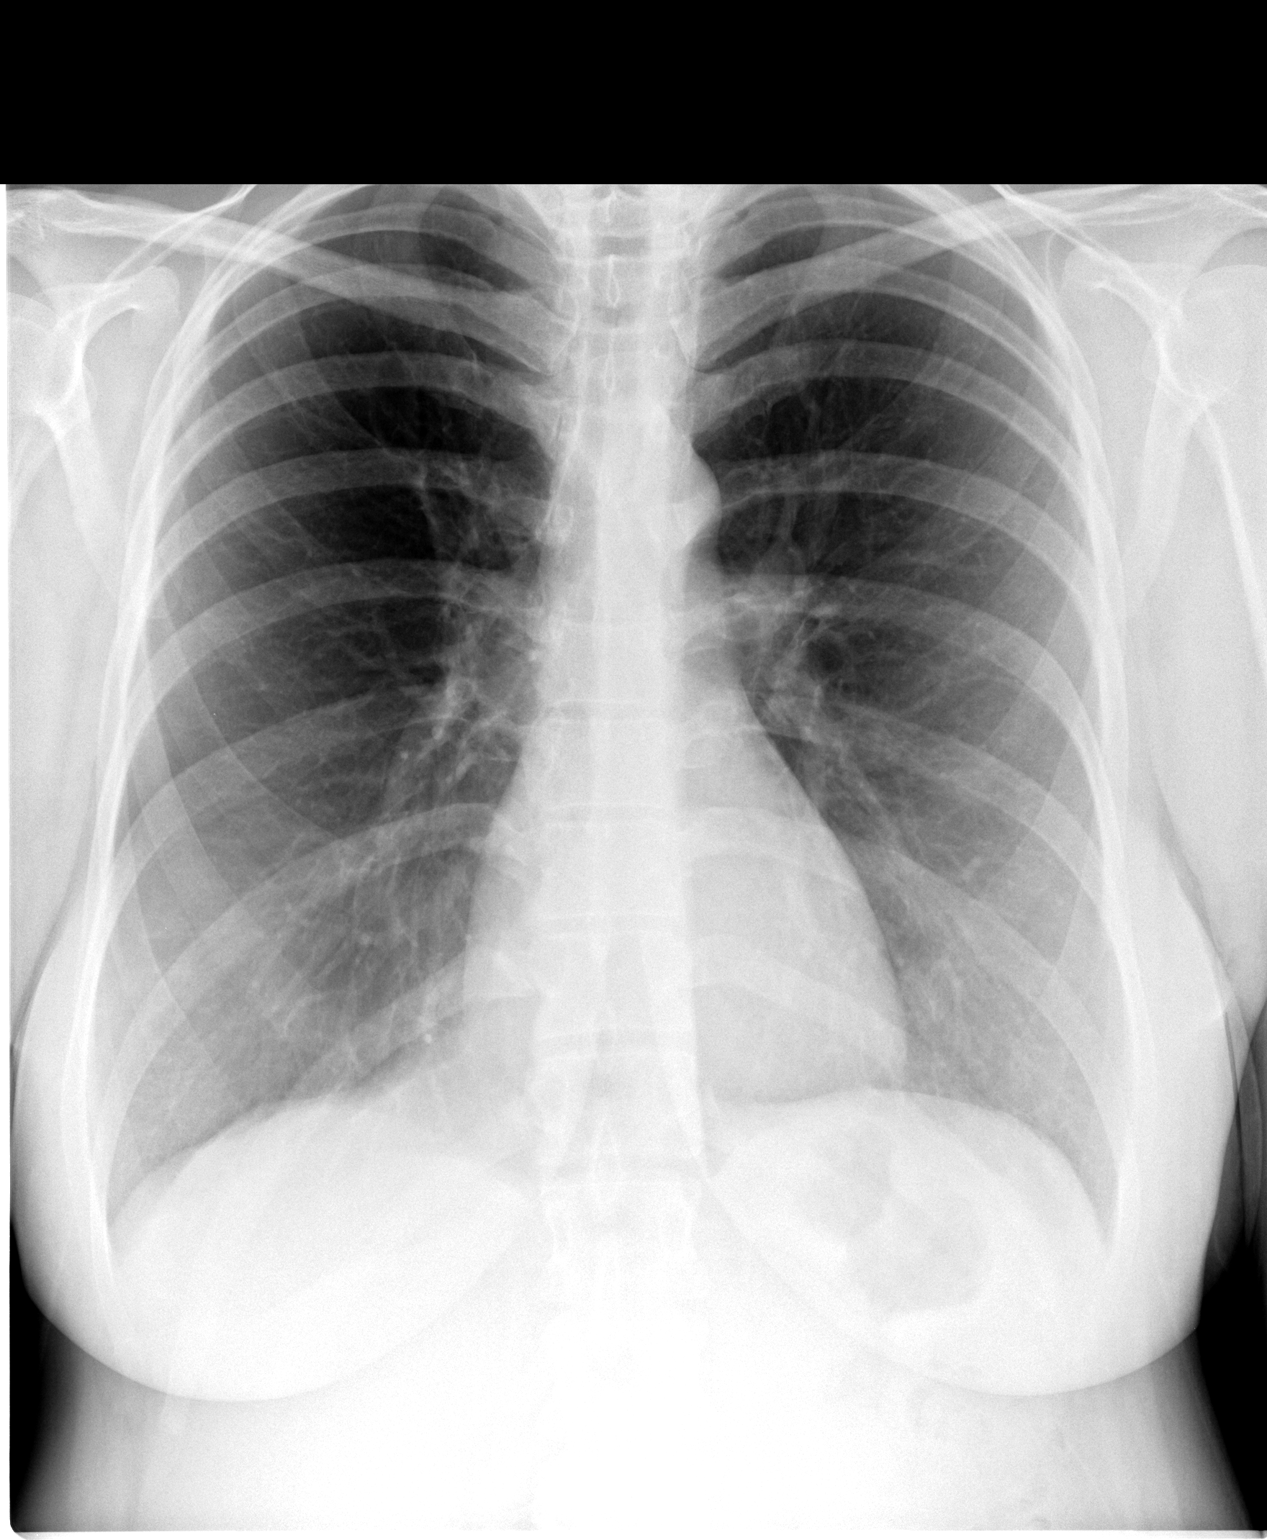

[view not recorded (2 of 2)]
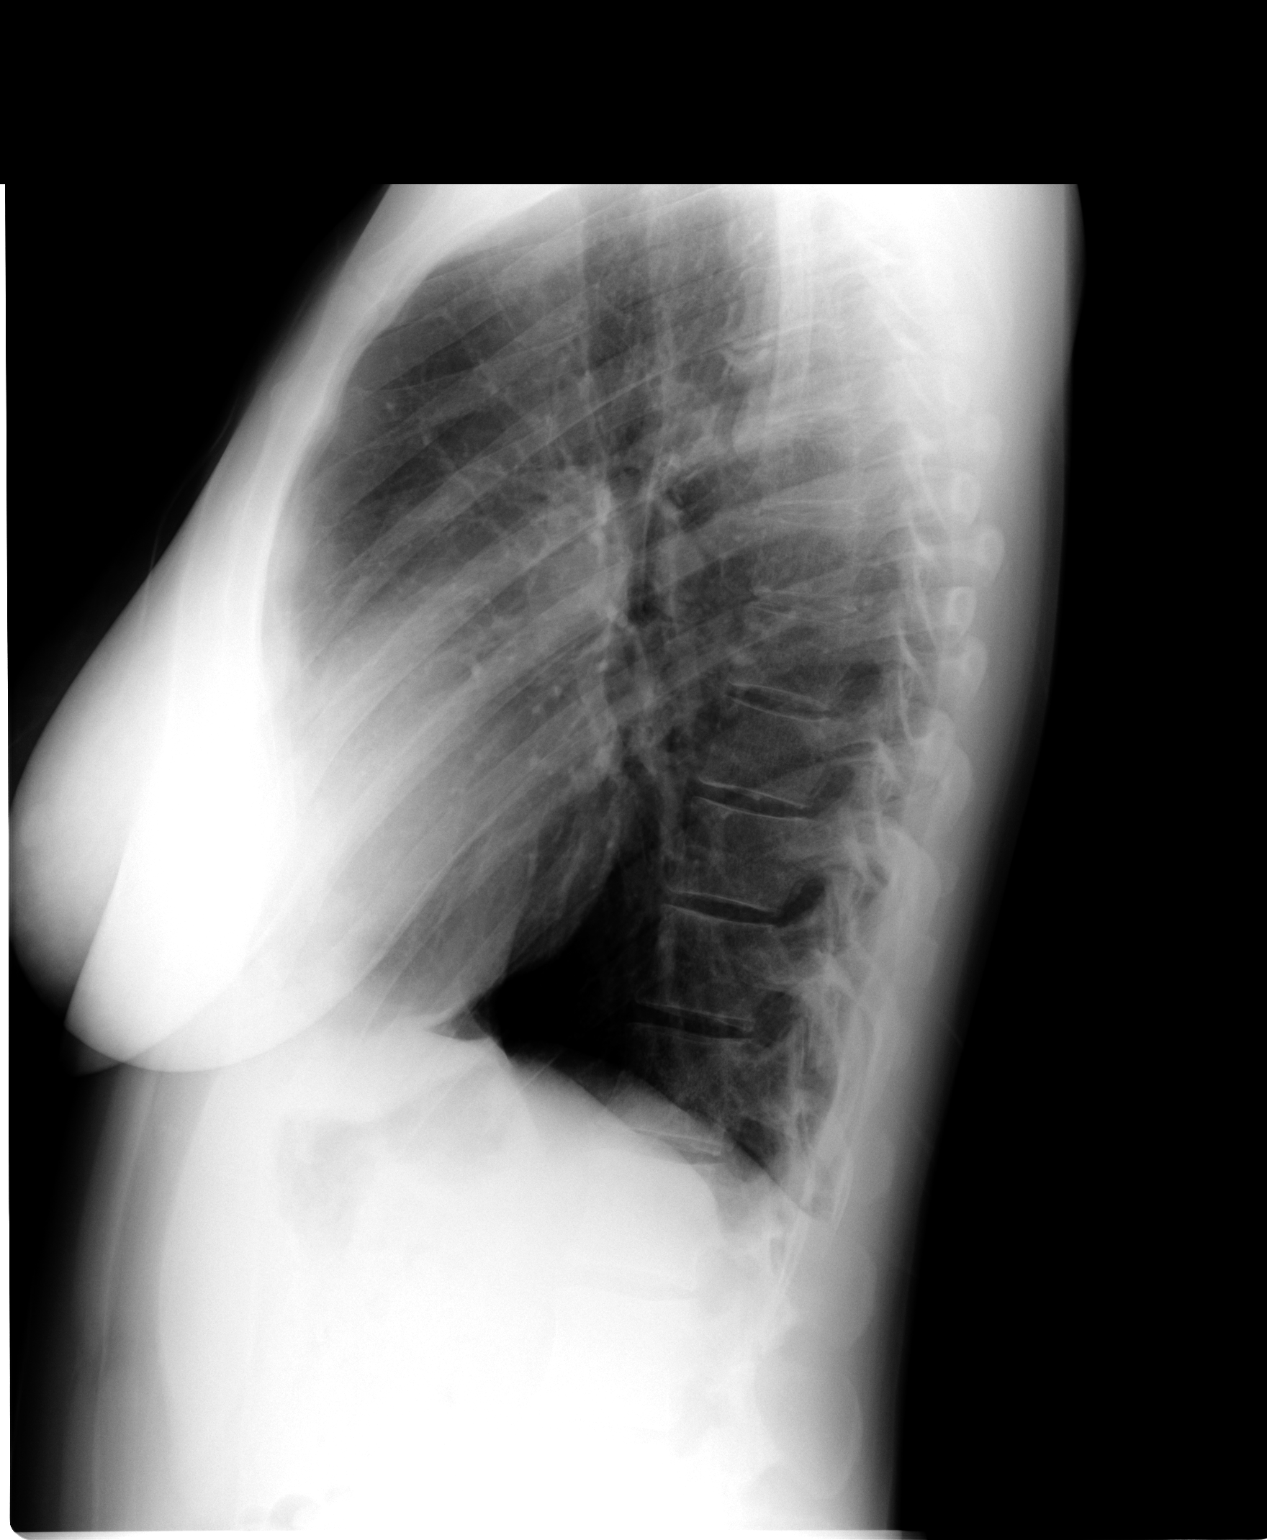

[2 of 2 positions shown; findings below may reference images not displayed]

FINDINGS: Heart size within normal limits.  Lungs mildly
hyperaerated but clear.  No pleural fluid or osseous lesions.
IMPRESSION: Lungs mildly hyperaerated but no active disease.

## 2010-02-20 ENCOUNTER — Encounter (INDEPENDENT_AMBULATORY_CARE_PROVIDER_SITE_OTHER): Payer: Self-pay | Admitting: *Deleted

## 2010-02-20 ENCOUNTER — Ambulatory Visit: Payer: Self-pay | Admitting: Gastroenterology

## 2010-02-20 ENCOUNTER — Telehealth: Payer: Self-pay | Admitting: Gastroenterology

## 2010-02-23 ENCOUNTER — Ambulatory Visit: Payer: Self-pay | Admitting: Gastroenterology

## 2010-02-24 ENCOUNTER — Encounter: Payer: Self-pay | Admitting: Gastroenterology

## 2010-03-01 ENCOUNTER — Ambulatory Visit: Payer: Self-pay | Admitting: Gastroenterology

## 2010-03-13 ENCOUNTER — Telehealth: Payer: Self-pay | Admitting: Gastroenterology

## 2010-03-20 ENCOUNTER — Telehealth: Payer: Self-pay | Admitting: Gastroenterology

## 2010-03-20 ENCOUNTER — Encounter: Payer: Self-pay | Admitting: Gastroenterology

## 2010-03-27 ENCOUNTER — Telehealth: Payer: Self-pay | Admitting: Gastroenterology

## 2010-03-28 ENCOUNTER — Ambulatory Visit: Payer: Self-pay | Admitting: Gastroenterology

## 2010-03-28 DIAGNOSIS — R1013 Epigastric pain: Secondary | ICD-10-CM | POA: Insufficient documentation

## 2010-03-30 ENCOUNTER — Ambulatory Visit: Payer: Self-pay | Admitting: Gastroenterology

## 2010-04-20 ENCOUNTER — Encounter (INDEPENDENT_AMBULATORY_CARE_PROVIDER_SITE_OTHER): Payer: Self-pay | Admitting: *Deleted

## 2010-05-12 ENCOUNTER — Encounter (HOSPITAL_COMMUNITY): Admission: RE | Admit: 2010-05-12 | Discharge: 2010-05-12 | Payer: Self-pay | Admitting: Oncology

## 2010-06-02 ENCOUNTER — Ambulatory Visit: Payer: Self-pay | Admitting: Oncology

## 2010-06-06 LAB — CBC WITH DIFFERENTIAL/PLATELET
BASO%: 0.5 % (ref 0.0–2.0)
Eosinophils Absolute: 0.2 10*3/uL (ref 0.0–0.5)
LYMPH%: 31.8 % (ref 14.0–49.7)
MCHC: 34 g/dL (ref 31.5–36.0)
MONO#: 0.5 10*3/uL (ref 0.1–0.9)
MONO%: 7.8 % (ref 0.0–14.0)
NEUT#: 3.4 10*3/uL (ref 1.5–6.5)
RBC: 3.73 10*6/uL (ref 3.70–5.45)
RDW: 13.7 % (ref 11.2–14.5)
WBC: 6 10*3/uL (ref 3.9–10.3)

## 2010-06-06 LAB — MORPHOLOGY

## 2010-06-07 LAB — COMPREHENSIVE METABOLIC PANEL
Alkaline Phosphatase: 51 U/L (ref 39–117)
CO2: 23 mEq/L (ref 19–32)
Creatinine, Ser: 0.63 mg/dL (ref 0.40–1.20)
Glucose, Bld: 102 mg/dL — ABNORMAL HIGH (ref 70–99)
Sodium: 139 mEq/L (ref 135–145)
Total Bilirubin: 0.4 mg/dL (ref 0.3–1.2)
Total Protein: 6.2 g/dL (ref 6.0–8.3)

## 2010-06-07 LAB — LACTATE DEHYDROGENASE: LDH: 87 U/L — ABNORMAL LOW (ref 94–250)

## 2010-06-07 LAB — SEDIMENTATION RATE: Sed Rate: 6 mm/hr (ref 0–22)

## 2010-06-13 ENCOUNTER — Encounter: Payer: Self-pay | Admitting: Gastroenterology

## 2010-06-13 LAB — LACTATE DEHYDROGENASE: LDH: 87 U/L — ABNORMAL LOW (ref 94–250)

## 2010-06-13 LAB — COMPREHENSIVE METABOLIC PANEL
Albumin: 4.1 g/dL (ref 3.5–5.2)
BUN: 3 mg/dL — ABNORMAL LOW (ref 6–23)
Chloride: 107 mEq/L (ref 96–112)
Creatinine, Ser: 0.58 mg/dL (ref 0.40–1.20)
Glucose, Bld: 91 mg/dL (ref 70–99)
Potassium: 3.8 mEq/L (ref 3.5–5.3)
Sodium: 139 mEq/L (ref 135–145)
Total Protein: 6.3 g/dL (ref 6.0–8.3)

## 2010-06-21 LAB — IMMUNOFIXATION ELECTROPHORESIS

## 2010-06-21 LAB — FOLATE: Folate: >20 ng/mL

## 2010-06-21 LAB — IRON AND TIBC
Iron: 57 ug/dL (ref 42–145)
TIBC: 294 ug/dL (ref 250–470)

## 2010-06-21 LAB — CERULOPLASMIN: Ceruloplasmin: 27 mg/dL (ref 21–63)

## 2010-06-21 LAB — PORPHYRINS, FRACTIONATION-PLASMA

## 2010-07-11 ENCOUNTER — Encounter: Admission: RE | Admit: 2010-07-11 | Discharge: 2010-07-11 | Payer: Self-pay | Admitting: Family Medicine

## 2010-07-11 IMAGING — MG MM DIGITAL SCREENING
4 series · 4 of 4 positions shown · non-contrast
Comparison: Prior studies.

DG SCREEN MAMMOGRAM BILATERAL
Bilateral CC and MLO view(s) were taken.
Technologist: [email or web address]
Prior study comparison: [DATE], DG screen mammogram bilateral.

DIGITAL SCREENING MAMMOGRAM WITH CAD:

[R CC]
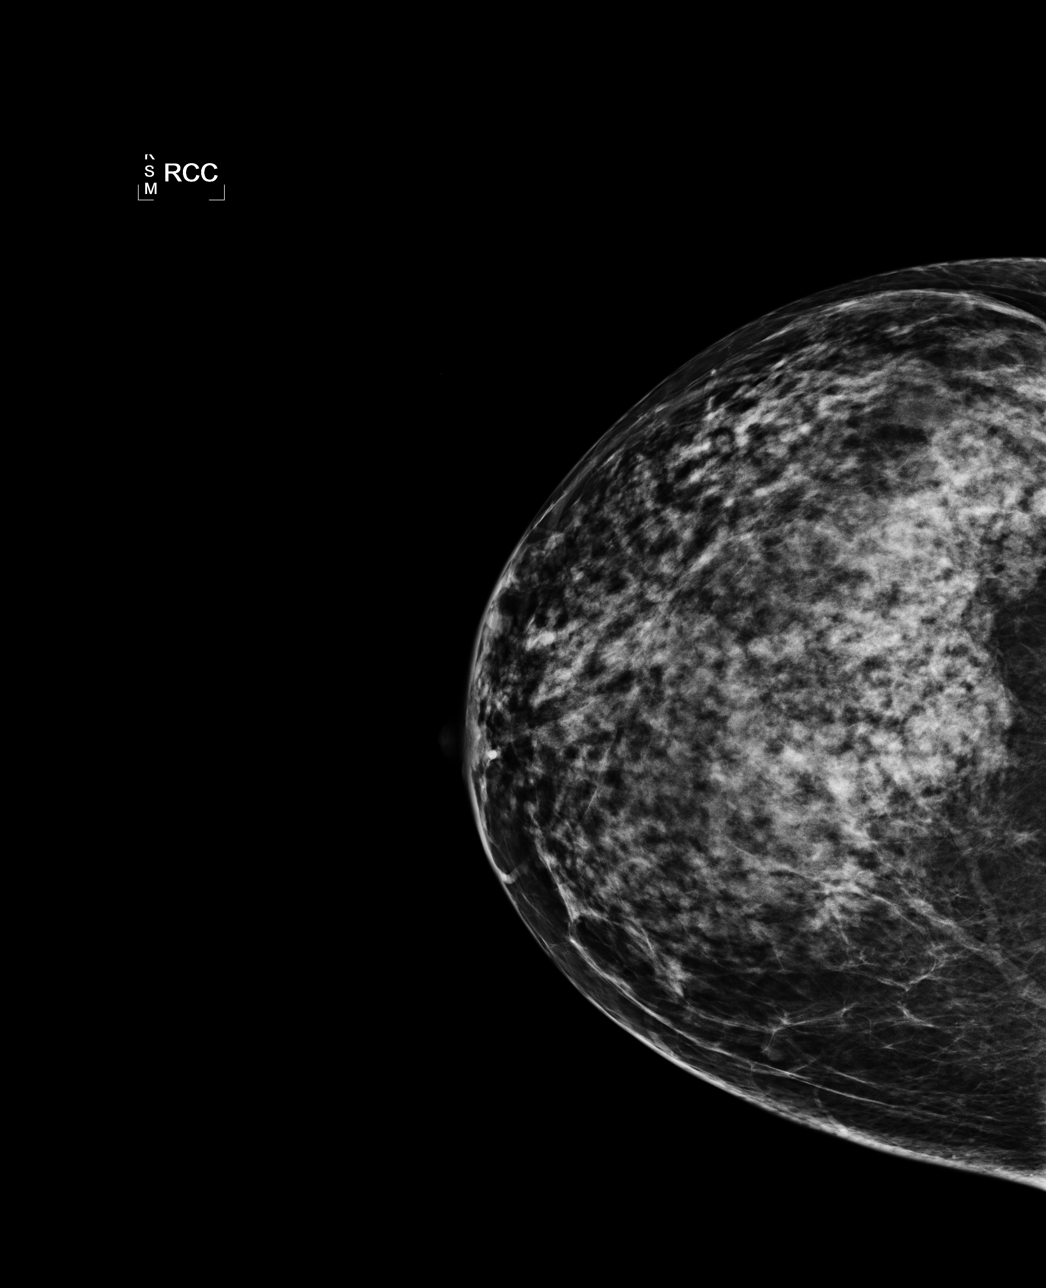

[L CC]
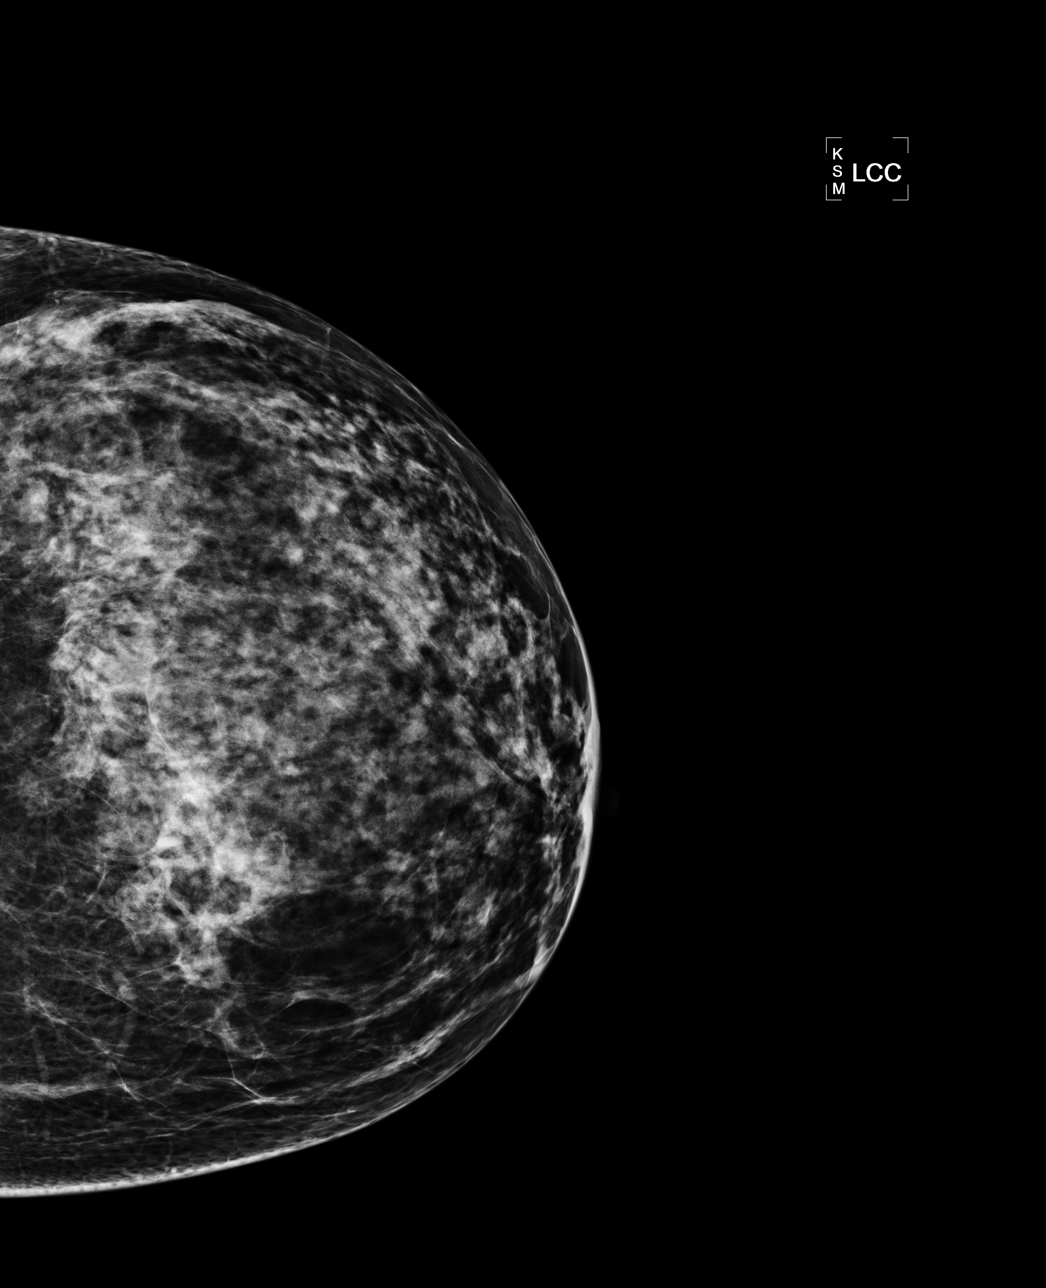

[L MLO]
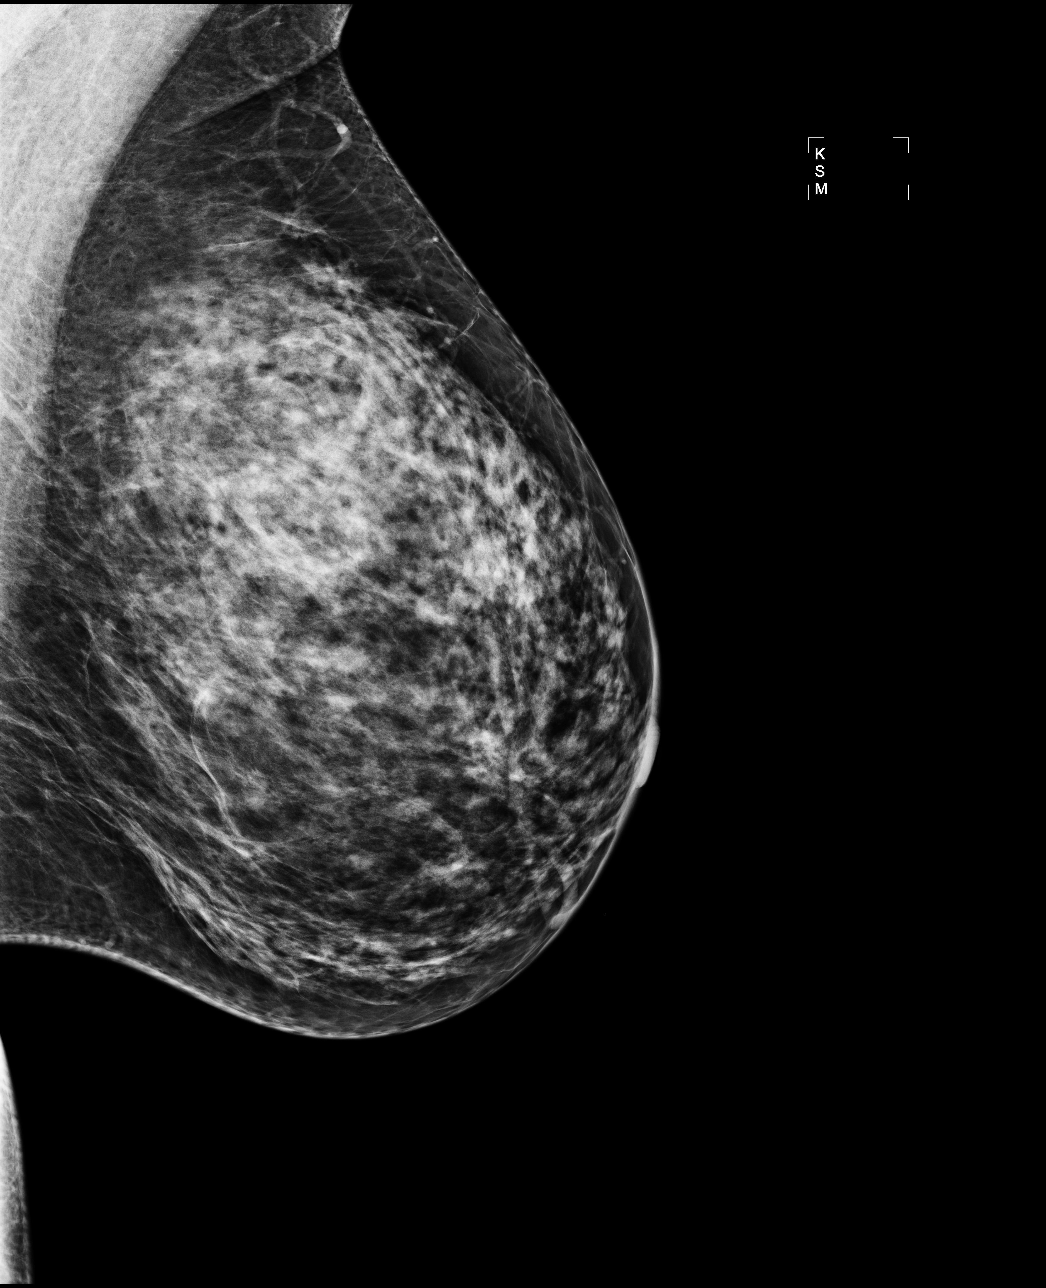

[R MLO]
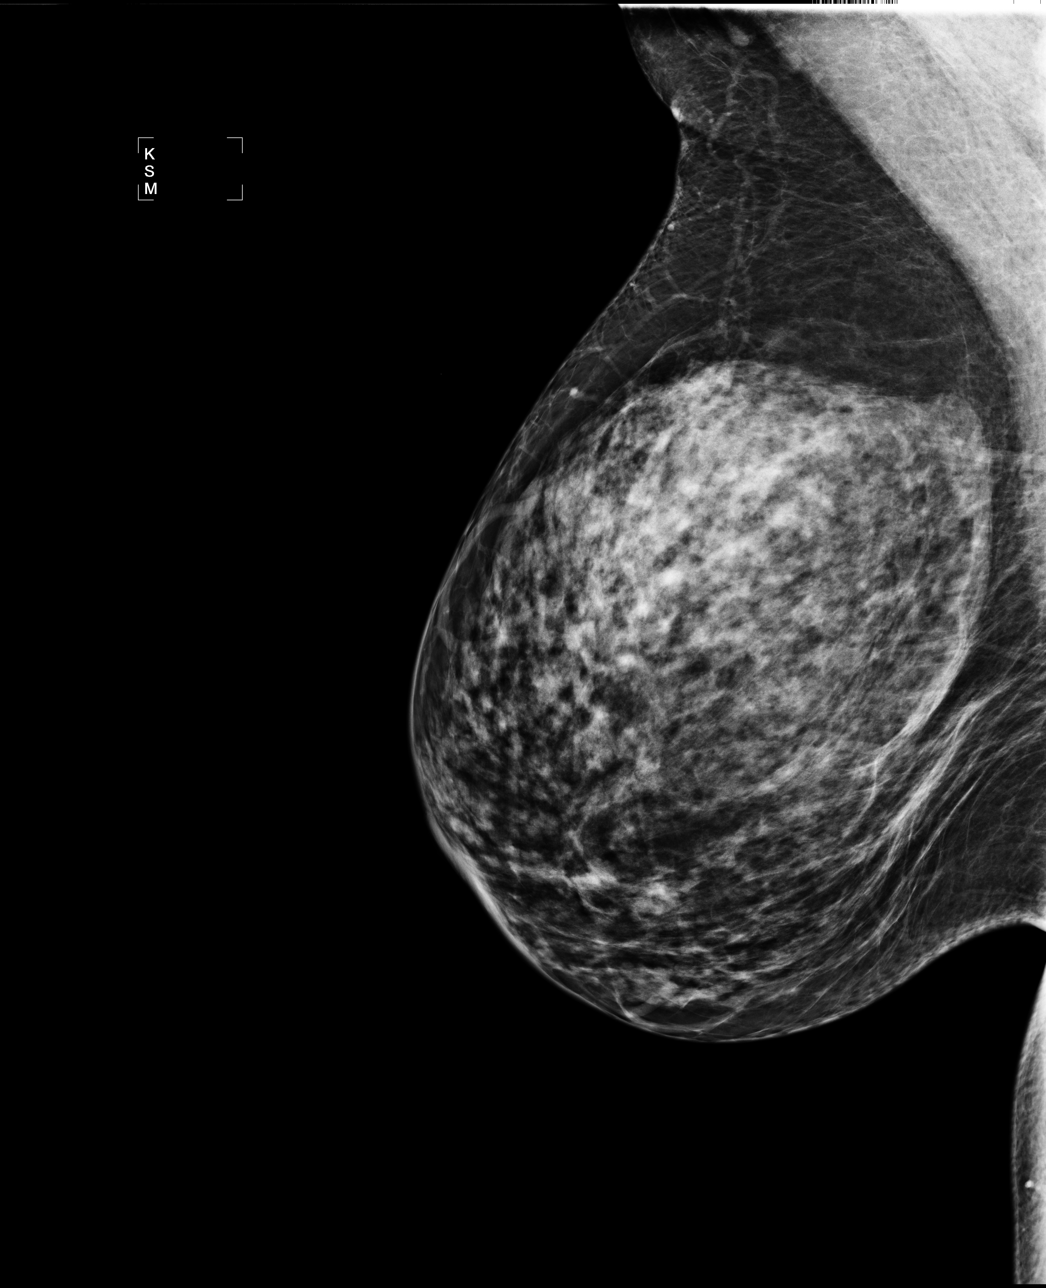

[4 of 4 positions shown; findings below may reference images not displayed]

The breast tissue is heterogeneously dense.  There is no dominant mass, architectural distortion or
calcification to suggest malignancy.

Images were processed with CAD.
IMPRESSION: No mammographic evidence of malignancy.  Suggest yearly screening mammography.

A result letter of this screening mammogram will be mailed directly to the patient.

ASSESSMENT: Negative - BI-RADS 1

Screening mammogram in 1 year.
,

## 2010-08-04 ENCOUNTER — Encounter (HOSPITAL_COMMUNITY)
Admission: RE | Admit: 2010-08-04 | Discharge: 2010-11-02 | Payer: Self-pay | Source: Home / Self Care | Attending: Oncology | Admitting: Oncology

## 2010-10-13 ENCOUNTER — Encounter: Admission: RE | Admit: 2010-10-13 | Discharge: 2010-10-13 | Payer: Self-pay | Admitting: Specialist

## 2010-12-03 ENCOUNTER — Encounter: Payer: Self-pay | Admitting: Internal Medicine

## 2010-12-03 ENCOUNTER — Encounter: Payer: Self-pay | Admitting: Family Medicine

## 2010-12-14 NOTE — Assessment & Plan Note (Signed)
Summary: F/U APPT...LSW.   History of Present Illness Visit Type: Follow-up Visit Primary GI MD: Melvia Heaps MD [facility] Primary Provider: Karleen Hampshire, MD Requesting Provider: Karleen Hampshire, MD Chief Complaint: diarrhea that is no better.  Glycopyrralate causes constipation. Pt is still having burning in stomach.  Took two doses of robinul on Saturday and did not have bowel movement until today, when she began having diarrhea. History of Present Illness:   Cheryl Cross has returned following colonoscopy endoscopic colitis.  She became constipated with glycopyrrolate.  At baseline she has up to 6 watery stools a day but covered by urgency.   GI Review of Systems      Denies abdominal pain, acid reflux, belching, bloating, chest pain, dysphagia with liquids, dysphagia with solids, heartburn, loss of appetite, nausea, vomiting, vomiting blood, weight loss, and  weight gain.      Reports diarrhea.     Denies anal fissure, black tarry stools, change in bowel habit, constipation, diverticulosis, fecal incontinence, heme positive stool, hemorrhoids, irritable bowel syndrome, jaundice, light color stool, liver problems, rectal bleeding, and  rectal pain.    Current Medications (verified): 1)  Geritol Complete  Tabs (Iron-Vitamins) .... Once Daily 2)  Citracal/vitamin D 250-200 Mg-Unit Tabs (Calcium Citrate-Vitamin D) .... Once Daily 3)  Aspir-Low 81 Mg Tbec (Aspirin) .... Once Daily 4)  Lexapro 10 Mg Tabs (Escitalopram Oxalate) .... Once Daily 5)  Hydrocodone-Acetaminophen 5-325 Mg Tabs (Hydrocodone-Acetaminophen) .... As Needed 6)  Glycopyrrolate 2 Mg Tabs (Glycopyrrolate) .Marland Kitchen.. 1 By Mouth Three Times A Day As Needed  Allergies (verified): 1)  ! Penicillin 2)  ! Voltaren  Past History:  Past Medical History: Reviewed history from 01/30/2010 and no changes required. Arthritis Irritable Bowel Syndrome  Past Surgical History: Reviewed history from 01/30/2010 and no changes  required. Hysterectomy Spinal Fusion  Family History: Reviewed history from 01/30/2010 and no changes required. Family History of Prostate Cancer:Father Family History of Diabetes: Mother Family History of Heart Disease:Mother  No FH of Colon Cancer:  Social History: Reviewed history from 01/30/2010 and no changes required. Occupation: Runner, broadcasting/film/video Patient currently smokes. 1ppd Alcohol Use - yes Daily Caffeine Use -3 Illicit Drug Use - no  Vital Signs:  Patient profile:   45 year old female Height:      67 inches Weight:      144 pounds BMI:     22.64 Pulse rate:   88 / minute Pulse rhythm:   regular BP sitting:   90 / 62  (left arm) Cuff size:   regular  Vitals Entered By: Cheryl Cross CMA Duncan Dull) (March 01, 2010 1:53 PM)   Impression & Recommendations:  Problem # 1:  IBS (ICD-564.1) Hpain and diarrhea are very likely related to IBS.  Recommendations #1 patient will consider enrollment in an IBS trial #22 consider a trial of Lotronex if she does not enter study  Patient Instructions: 1)  Copy sent to : William McGouh,MD 2)  The medication list was reviewed and reconciled.  All changed / newly prescribed medications were explained.  A complete medication list was provided to the patient / caregiver.  Appended Document: F/U APPT...LSW. cancel rx sent electronic      pt instructed and signed form on Lotronex   Clinical Lists Changes  Medications: Added new medication of LOTRONEX 1 MG TABS (ALOSETRON HCL) 1 by mouth once daily can take up to 2 if needed - Signed Rx of LOTRONEX 1 MG TABS (ALOSETRON HCL) 1 by mouth once daily can take  up to 2 if needed;  #60 x 1;  Signed;  Entered by: Cheryl Cross CMA (AAMA);  Authorized by: Cheryl Meckel MD;  Method used: Electronically to [facility] [address] [place], [place], [place]  [address], Ph: [phone], Fax: [phone] Rx of LOTRONEX 1 MG TABS (ALOSETRON HCL) 1 by mouth once daily can take  up to 2 if needed;  #60 x 1;  Signed;  Entered by: Cheryl Cross CMA (AAMA);  Authorized by: Cheryl Meckel MD;  Method used: Printed then faxed to [facility] [address], [place], [place], [place]  [address], Ph: [phone], Fax: [phone]    Prescriptions: LOTRONEX 1 MG TABS (ALOSETRON HCL) 1 by mouth once daily can take up to 2 if needed  #60 x 1   Entered by:   Cheryl Cross CMA (AAMA)   Authorized by:   Cheryl Meckel MD   Signed by:   Cheryl Cross CMA (AAMA) on 03/01/2010   Method used:   Printed then faxed to ...       [facility] (retail)       [address]       [place], [place]  [address]       Ph: [phone]       Fax: [phone]   RxID:   [id number] LOTRONEX 1 MG TABS (ALOSETRON HCL) 1 by mouth once daily can take up to 2 if needed  #60 x 1   Entered by:   Cheryl Cross CMA (AAMA)   Authorized by:   Cheryl Meckel MD   Signed by:   Cheryl Cross CMA (AAMA) on 03/01/2010   Method used:   Electronically to        [facility] [address] (retail)       [address]       [place], [place]  [address]       Ph: [phone]       Fax: [phone]   RxID:   [id number]

## 2010-12-14 NOTE — Miscellaneous (Signed)
Summary: COLON  [facility]  02-23-10 AT 11:30AM  DIARRHEA,ABD. PAIN    DR.KAPL...  Clinical Lists Changes  Medications: Added new medication of MOVIPREP 100 GM  SOLR (PEG-KCL-NACL-NASULF-NA ASC-C) As directed - Signed Rx of MOVIPREP 100 GM  SOLR (PEG-KCL-NACL-NASULF-NA ASC-C) As directed;  #1 x 0;  Signed;  Entered by: Clide Cliff RN;  Authorized by: Louis Meckel MD;  Method used: Electronically to [facility] [address] [address], [place], [place], [place]  [address], Ph: [phone], Fax: [phone] Allergies: Changed allergy or adverse reaction from VOLTAREN to VOLTAREN    Prescriptions: MOVIPREP 100 GM  SOLR (PEG-KCL-NACL-NASULF-NA ASC-C) As directed  #1 x 0   Entered by:   Clide Cliff RN   Authorized by:   Louis Meckel MD   Signed by:   Clide Cliff RN on 02/20/2010   Method used:   Electronically to        [facility] [address] (retail)       [address]       [place], [place]  [address]       Ph: [phone]       Fax: [phone]   RxID:   [id number]

## 2010-12-14 NOTE — Progress Notes (Signed)
Summary: Condition Update  Phone Note Call from Patient Call back at Home Phone [phone]   Call For: Dr Arlyce Dice Reason for Call: Talk to Nurse Summary of Call: Completed medication a week a go. Symptoms have come back. Is missing work and wonders what else can be done.  Initial call taken by: Leanor Kail [facility],  February 20, 2010 9:50 AM  Follow-up for Phone Call        Last OV 01-30-10, triage 02-03-10.  States Glycopyrolate 2gm tid prn helps the loose stools, but not the LLQ burning.  States the Cipro given on 02-03-10 resolved symptoms, but they now have returned. Pt. c/o LLQ abd. burning, nausea, diarrhea. Stools are loose to watery. Denies blood, black stools, fever.   [facility] PLEASE ADVISE  Follow-up by: Laureen Ochs LPN,  February 20, 2010 11:50 AM  Additional Follow-up for Phone Call Additional follow up Details #1::        resume hyomax as needed needs colo Additional Follow-up by: Louis Meckel MD,  February 20, 2010 12:15 PM    Additional Follow-up for Phone Call Additional follow up Details #2::    Above MD orders reviewed with patient. Previsit is today at 2:30pm and Colonoscopy is in [facility] on 02-23-10 at 11:30am. Pt. instructed to call back as needed.  Follow-up by: Laureen Ochs LPN,  February 20, 2010 12:54 PM

## 2010-12-14 NOTE — Letter (Signed)
[facility]   Imported By: Sherian Rein 07/04/2010 13:32:36  _____________________________________________________________________  External Attachment:    Type:   Image     Comment:   External Document

## 2010-12-14 NOTE — Progress Notes (Signed)
Summary: Hyomax discontinued  Phone Note Call from Patient Call back at Home Phone [phone]   Call For: Dr Arlyce Dice Reason for Call: Refill Medication Summary of Call: Hyomax -[facility] on [address] told her it has been discontinued. Wonders if we can call pharmacy at [phone] so we can get something ordered as soon as possible. Wants to start feeling better so she can get back to work. Initial call taken by: Leanor Kail [facility],  January 31, 2010 1:28 PM  Follow-up for Phone Call        Dr Arlyce Dice, What do you want to send?? Follow-up by: Merri Ray CMA Duncan Dull),  January 31, 2010 1:39 PM  Additional Follow-up for Phone Call Additional follow up Details #1::        glycopyrolate 2mg  three times a day prn Additional Follow-up by: Louis Meckel MD,  January 31, 2010 1:46 PM    Additional Follow-up for Phone Call Additional follow up Details #2::    Returned pts call, explined to her that I was sending in new prescrition for her Follow-up by: Merri Ray CMA Duncan Dull),  January 31, 2010 3:20 PM  New/Updated Medications: GLYCOPYRROLATE 2 MG TABS (GLYCOPYRROLATE) 1 by mouth three times a day as needed Prescriptions: GLYCOPYRROLATE 2 MG TABS (GLYCOPYRROLATE) 1 by mouth three times a day as needed  #90 x 1   Entered by:   Merri Ray CMA (AAMA)   Authorized by:   Louis Meckel MD   Signed by:   Merri Ray CMA (AAMA) on 01/31/2010   Method used:   Electronically to        [facility] (retail)       [address]       [place], [place]  [address]       Ph: [phone]       Fax: [phone]   RxID:   [id number]

## 2010-12-14 NOTE — Procedures (Signed)
Summary: COLONOSCOPY    Patient Name: Cheryl Cross, Cheryl Cross MRN: [id number] Procedure Procedures: Colonoscopy CPT: [id number].  Personnel: Endoscopist: Barbette Hair. Arlyce Dice, MD.  Referred By: Karleen Hampshire, MD.  Exam Location: Exam performed in [facility]. Outpatient  Patient Consent: Procedure, Alternatives, Risks and Benefits discussed, consent obtained, from patient.  Indications Symptoms: Constipation Diarrhea  History  Pre-Exam Physical: Performed Aug 21, 2000. Cardio-pulmonary exam, HEENT exam , Abdominal exam, Extremity exam, Neurological exam WNL.  Exam Exam: Extent of exam reached: Cecum, extent intended: Cecum.  Patient position: on left side. ASA Classification: I. Tolerance: excellent.  Monitoring: Pulse and BP monitoring, Oximetry used. Supplemental O2 given.  Colon Prep Used Mag Citrate for colon prep. Prep results: excellent.  Fluoroscopy: Fluoroscopy was not used.  Sedation Meds: Residual sedation present from prior procedure today.   Findings , IMAGE TAKEN - NORMAL EXAM: Cecum to Rectum.   Assessment Normal examination.  Diagnoses: 564.1. .   Events  Unplanned Interventions: No intervention was required.  Unplanned Events: There were no complications. Plans Medication Plan: Fiber supplements: Per Diem tbs QD, starting Aug 21, 2000   Disposition: After procedure patient sent to recovery. After recovery patient sent home.  Scheduling/Referral: Clinic Visit, to Barbette Hair. Arlyce Dice, MD, around Sep 18, 2000.    CC: Delrae Sawyers  This report was created from the original endoscopy report, which was reviewed and signed by the above listed endoscopist.

## 2010-12-14 NOTE — Procedures (Signed)
Summary: Colonoscopy  Patient: Cheryl Cross Note: All result statuses are Final unless otherwise noted.  Tests: (1) Colonoscopy (COL)   COL Colonoscopy           DONE (C)     [facility]     [address]     [place], [place]  [address]           COLONOSCOPY PROCEDURE REPORT           PATIENT:  Cheryl, Cross  MR#:  [id number]     BIRTHDATE:  05-14-1966, 43 yrs. old  GENDER:  female           ENDOSCOPIST:  Barbette Hair. Arlyce Dice, MD     Referred by:           PROCEDURE DATE:  02/23/2010     PROCEDURE:  Colonoscopy with biopsy     ASA CLASS:  Class II     INDICATIONS:  1) unexplained diarrhea           MEDICATIONS:   Fentanyl 75 mcg IV, Versed 7 mg IV, Benadryl 25 mg     IV           DESCRIPTION OF PROCEDURE:   After the risks benefits and     alternatives of the procedure were thoroughly explained, informed     consent was obtained.  Digital rectal exam was performed and     revealed no abnormalities.   The LB [id number] endoscope     was introduced through the anus and advanced to the cecum, which     was identified by both the appendix and ileocecal valve, without     limitations.  The quality of the prep was excellent, using     MoviPrep.  The instrument was then slowly withdrawn as the colon     was fully examined.     <<PROCEDUREIMAGES>>           FINDINGS:  A normal appearing cecum, ileocecal valve, and     appendiceal orifice were identified. The ascending, hepatic     flexure, transverse, splenic flexure, descending, sigmoid colon,     and rectum appeared unremarkable (see image1, image2, image3,     image4, image6, and image7).   Retroflexed views in the rectum     revealed no abnormalities.   Random biopsies were taken throughout     the colon to r/o microscopic colitis (correction).   The time to     cecum =  2.5  minutes. The scope was then withdrawn (time =  7     min) from the patient and the procedure completed.           COMPLICATIONS:   None           ENDOSCOPIC IMPRESSION:     1) Normal colon     RECOMMENDATIONS:     1) Await biopsy results     2) call office next 1-3 days to schedule followup visit in 2     weeks           REPEAT EXAM:  No           ______________________________     Barbette Hair. Arlyce Dice, MD           CC: Karleen Hampshire, MD           n.     REVISED:  02/24/2010 04:12 PM     eSIGNED:   Barbette Hair. Arlyce Dice at  02/24/2010 04:12 PM           [name], [name], [id number]  Note: An exclamation mark (!) indicates a result that was not dispersed into the flowsheet. Document Creation Date: 02/24/2010 4:12 PM _______________________________________________________________________  (1) Order result status: Final Collection or observation date-time: 02/23/2010 12:30 Requested date-time:  Receipt date-time:  Reported date-time:  Referring Physician:   Ordering Physician: Melvia Heaps [phone]) Specimen Source:  Source: Launa Grill Order Number: [id number] Lab site:

## 2010-12-14 NOTE — Letter (Signed)
Summary: [facility] Instructions  [facility]  [address] [place], [place] [address]   Phone: [phone]  Fax: [phone]       Cheryl Cross    March 12, 1966    MRN: [id number]        Procedure Day /Date:  02/23/10  Thursday     Arrival Time:  10:30am     Procedure Time:  11:30am     Location of Procedure:                    _ x_   Endoscopy Center (4th Floor)   PREPARATION FOR COLONOSCOPY WITH MOVIPREP   Starting 5 days prior to your procedure _4/11/11 _ do not eat nuts, seeds, popcorn, corn, beans, peas,  salads, or any raw vegetables.  Do not take any fiber supplements (e.g. Metamucil, Citrucel, and Benefiber).  THE DAY BEFORE YOUR PROCEDURE         DATE:   02/22/10  DAY:  Wednesday  1.  Drink clear liquids the entire day-NO SOLID FOOD  2.  Do not drink anything colored red or purple.  Avoid juices with pulp.  No orange juice.  3.  Drink at least 64 oz. (8 glasses) of fluid/clear liquids during the day to prevent dehydration and help the prep work efficiently.  CLEAR LIQUIDS INCLUDE: Water Jello Ice Popsicles Tea (sugar ok, no milk/cream) Powdered fruit flavored drinks Coffee (sugar ok, no milk/cream) Gatorade Juice: apple, white grape, white cranberry  Lemonade Clear bullion, consomm, broth Carbonated beverages (any kind) Strained chicken noodle soup Hard Candy                             4.  In the morning, mix first dose of MoviPrep solution:    Empty 1 Pouch A and 1 Pouch B into the disposable container    Add lukewarm drinking water to the top line of the container. Mix to dissolve    Refrigerate (mixed solution should be used within 24 hrs)  5.  Begin drinking the prep at 5:00 p.m. The MoviPrep container is divided by 4 marks.   Every 15 minutes drink the solution down to the next mark (approximately 8 oz) until the full liter is complete.   6.  Follow completed prep with 16 oz of clear liquid of your choice (Nothing red or  purple).  Continue to drink clear liquids until bedtime.  7.  Before going to bed, mix second dose of MoviPrep solution:    Empty 1 Pouch A and 1 Pouch B into the disposable container    Add lukewarm drinking water to the top line of the container. Mix to dissolve    Refrigerate  THE DAY OF YOUR PROCEDURE      DATE:  02/23/10  DAY:   Thursday  Beginning at  6:30 a.m. (5 hours before procedure):         1. Every 15 minutes, drink the solution down to the next mark (approx 8 oz) until the full liter is complete.  2. Follow completed prep with 16 oz. of clear liquid of your choice.    3. You may drink clear liquids until  9:30am  (2 HOURS BEFORE PROCEDURE).   MEDICATION INSTRUCTIONS  Unless otherwise instructed, you should take regular prescription medications with a small sip of water   as early as possible the morning of your procedure.  OTHER INSTRUCTIONS  You will need a responsible adult at least 44 years of age to accompany you and drive you home.   This person must remain in the waiting room during your procedure.  Wear loose fitting clothing that is easily removed.  Leave jewelry and other valuables at home.  However, you may wish to bring a book to read or  an iPod/MP3 player to listen to music as you wait for your procedure to start.  Remove all body piercing jewelry and leave at home.  Total time from sign-in until discharge is approximately 2-3 hours.  You should go home directly after your procedure and rest.  You can resume normal activities the  day after your procedure.  The day of your procedure you should not:   Drive   Make legal decisions   Operate machinery   Drink alcohol   Return to work  You will receive specific instructions about eating, activities and medications before you leave.    The above instructions have been reviewed and explained to me by   Clide Cliff, RN______________________    I fully understand and  can verbalize these instructions _____________________________ Date _________

## 2010-12-14 NOTE — Letter (Signed)
Summary: EGD Instructions  [facility]  [address] [place], [place] [address]   Phone: [phone]  Fax: [phone]       Cheryl Cross    February 22, 1966    MRN: [id number]       Procedure Day /Date:THURSDAY 03/30/2010     Arrival Time: 2:30PM     Procedure Time:3:30PM     Location of Procedure:                    X   Endoscopy Center (4th Floor)   PREPARATION FOR ENDOSCOPY   On 03/30/2010 THE DAY OF THE PROCEDURE:  1.   No solid foods, milk or milk products are allowed after midnight the night before your procedure.  2.   Do not drink anything colored red or purple.  Avoid juices with pulp.  No orange juice.  3.  You may drink clear liquids until1:30PM, which is 2 hours before your procedure.                                                                                                CLEAR LIQUIDS INCLUDE: Water Jello Ice Popsicles Tea (sugar ok, no milk/cream) Powdered fruit flavored drinks Coffee (sugar ok, no milk/cream) Gatorade Juice: apple, white grape, white cranberry  Lemonade Clear bullion, consomm, broth Carbonated beverages (any kind) Strained chicken noodle soup Hard Candy   MEDICATION INSTRUCTIONS  Unless otherwise instructed, you should take regular prescription medications with a small sip of water as early as possible the morning of your procedure.              OTHER INSTRUCTIONS  You will need a responsible adult at least 43 years of age to accompany you and drive you home.   This person must remain in the waiting room during your procedure.  Wear loose fitting clothing that is easily removed.  Leave jewelry and other valuables at home.  However, you may wish to bring a book to read or an iPod/MP3 player to listen to music as you wait for your procedure to start.  Remove all body piercing jewelry and leave at home.  Total time from sign-in until discharge is approximately 2-3 hours.  You should go home  directly after your procedure and rest.  You can resume normal activities the day after your procedure.  The day of your procedure you should not:   Drive   Make legal decisions   Operate machinery   Drink alcohol   Return to work  You will receive specific instructions about eating, activities and medications before you leave.    The above instructions have been reviewed and explained to me by   _______________________    I fully understand and can verbalize these instructions _____________________________ Date _________

## 2010-12-14 NOTE — Assessment & Plan Note (Signed)
Summary: abdominal pain/lk   History of Present Illness Visit Type: Initial Consult Primary GI MD: Melvia Heaps MD [facility] Primary Provider: Karleen Hampshire, MD Requesting Provider: Karleen Hampshire, MD Chief Complaint: Abdominal pain x3 weeks with some fever History of Present Illness:   Cheryl Cross is a 45 year old white female referred at the request of Dr. Regino Schultze for evaluation of lower abdominal pain, nausea and vomiting.  She has a long history of IBS characterized by alternating episodes of diarrhea and constipation.  She underwent colonoscopy 10 years ago for this problem.  Exam was normal.  She chronically suffers from erratic bowels characterized by frequent episodes of diarrhea.  She may have several loose stools a day followed by a couple days of constipation.  Over the past 2 weeks she has had low-grade fevers with burning lower abdominalpain and episodes of nausea and vomiting.  Because of worsening pain she underwent a CT scan, which I reviewed, that demonstrated a left adnexal cyst.  She was evaluated by gynecology who felt that this was not the etiology for her pain.  She has had fevers up to 100 and still complains of intermittent nausea though she no longer vomits.  Stools continue to be loose.  She denies melena or hematochezia.   GI Review of Systems    Reports abdominal pain, bloating, nausea, and  vomiting.      Denies acid reflux, belching, chest pain, dysphagia with liquids, dysphagia with solids, heartburn, loss of appetite, vomiting blood, weight loss, and  weight gain.      Reports diarrhea and  irritable bowel syndrome.     Denies anal fissure, black tarry stools, change in bowel habit, constipation, diverticulosis, fecal incontinence, heme positive stool, hemorrhoids, jaundice, light color stool, liver problems, rectal bleeding, and  rectal pain. Preventive Screening-Counseling & Management  Alcohol-Tobacco     Smoking Status: current      Drug Use:  no.        Current Medications (verified): 1)  Geritol Complete  Tabs (Iron-Vitamins) .... Once Daily 2)  Citracal/vitamin D 250-200 Mg-Unit Tabs (Calcium Citrate-Vitamin D) .... Once Daily 3)  Aspir-Low 81 Mg Tbec (Aspirin) .... Once Daily 4)  Lexapro 10 Mg Tabs (Escitalopram Oxalate) .... Once Daily 5)  Flexeril 10 Mg Tabs (Cyclobenzaprine Hcl) .... As Needed 6)  Hydrocodone-Acetaminophen 5-325 Mg Tabs (Hydrocodone-Acetaminophen) .... As Needed  Allergies (verified): 1)  ! Penicillin 2)  ! Voltaren  Past History:  Past Medical History: Arthritis Irritable Bowel Syndrome  Past Surgical History: Hysterectomy Spinal Fusion  Family History: Family History of Prostate Cancer:Father Family History of Diabetes: Mother Family History of Heart Disease:Mother  No FH of Colon Cancer:  Social History: Occupation: Runner, broadcasting/film/video Patient currently smokes. 1ppd Alcohol Use - yes Daily Caffeine Use -3 Illicit Drug Use - no Smoking Status:  current Drug Use:  no  Review of Systems       The patient complains of arthritis/joint pain, back pain, fatigue, fever, headaches-new, muscle pains/cramps, night sweats, and swollen lymph glands.  The patient denies allergy/sinus, anemia, anxiety-new, blood in urine, breast changes/lumps, change in vision, confusion, cough, coughing up blood, depression-new, fainting, hearing problems, heart murmur, heart rhythm changes, itching, menstrual pain, nosebleeds, pregnancy symptoms, shortness of breath, skin rash, sleeping problems, sore throat, swelling of feet/legs, thirst - excessive , urination - excessive , urination changes/pain, urine leakage, vision changes, and voice change.         All other systems were reviewed and were negative   Vital Signs:  Patient profile:   45 year old female Height:      67 inches Weight:      144.38 pounds BMI:     22.69 Pulse rate:   84 / minute Pulse rhythm:   regular BP sitting:   110 / 76  (right arm) Cuff size:    regular  Vitals Entered By: Cheryl Cross CMA Duncan Dull) (January 30, 2010 9:00 AM)  Physical Exam  Additional Exam:  On physical exam she is a well-developed large female  skin: anicteric HEENT: normocephalic; PEERLA; no nasal or pharyngeal abnormalities neck: supple nodes: no cervical lymphadenopathy chest: clear to ausculatation and percussion heart: no murmurs, gallops, or rubs abd: soft, nontender; BS normoactive; no abdominal masses, tenderness, organomegaly rectal: deferred ext: no cynanosis, clubbing, edema skeletal: no deformities neuro: oriented x 3; no focal abnormalities    Impression & Recommendations:  Problem # 1:  ABDOMINAL PAIN, LEFT LOWER QUADRANT (ICD-789.04) Patient's acute symptoms are probably due to a viral gastroenteritis with subsequent exacerbation of her IBS.  There is no evidence for acute diverticulitis or other intra-abdominal processes.  Bacterial enteric infection is less likely.  Recommendations #1 trial of hyomax 0.375 mg b.i.d. for 5 days then p.r.n.  Problem # 2:  IBS (ICD-564.1) The patient has had diarrhea-predominant IBS.  Recommendations #1 once her acute illness has subsided she was instructed to take daily fiber supplementation and to use hyomax as needed  Patient Instructions: 1)  Your prescription will be at your pharmacy today 2)  Please schedule a return office visit for 3-4 weeks 3)  The medication list was reviewed and reconciled.  All changed / newly prescribed medications were explained.  A complete medication list was provided to the patient / caregiver. 4)  CC Dr. Karleen Hampshire 5)  cc Dr. Teodora Medici Prescriptions: HYOMAX-DT 0.375 MG CR-TABS (HYOSCYAMINE SULFATE) take one tab twice a day for 5 days then as needed for abdominal pain  #25 x 2   Entered and Authorized by:   Louis Meckel MD   Signed by:   Louis Meckel MD on 01/30/2010   Method used:   Electronically to        [facility] (retail)       [address]       [place], [place]  [address]       Ph: [phone]       Fax: [phone]   RxID:   [id number]

## 2010-12-14 NOTE — Miscellaneous (Signed)
Summary: [facility]  [facility]   Imported By: Lester Manor 03/06/2010 09:34:40  _____________________________________________________________________  External Attachment:    Type:   Image     Comment:   External Document

## 2010-12-14 NOTE — Progress Notes (Signed)
Summary: Triage  Phone Note Call from Patient Call back at Home Phone [phone]   Caller: Patient Call For: Dr. Arlyce Dice Reason for Call: Talk to Nurse Summary of Call: pt has been taking Prilosec. It helped at first and it is no longer working. She has some samples of Nexium and wants to know if she can try the Nexium? Initial call taken by: Karna Christmas,  Mar 20, 2010 4:23 PM  Follow-up for Phone Call        Pt. will try Nexium daily and call with an update in 1 week, sooner as needed. Follow-up by: Laureen Ochs LPN,  Mar 20, 8468 4:43 PM

## 2010-12-14 NOTE — Progress Notes (Signed)
Summary: Triage  Phone Note Call from Patient Call back at Home Phone [phone]   Caller: Patient Call For: Dr. Arlyce Dice Reason for Call: Talk to Nurse Summary of Call: Update: Pt started meds on Tues. night 3x a day w/bland diet and still having diarrhea, nausea and burning in abd. Wants to know if she should give meds a few more days? Initial call taken by: Karna Christmas,  February 03, 2010 8:23 AM  Follow-up for Phone Call        Pt. tried to advance diet yesterday ate a hamburger at lunch and within 30 minutes cramping and loose stools began.Went back to bland diet at supper but loose stools cont'd for a total of 5 for the day.Sees mucus in stool but no blood.Discomfort continues to left and below umbilicus.She describes it as a burning pain.Is bloated and nauseous.Doesn't feel the Robinul helped much. Follow-up by: Teryl Lucy RN,  February 03, 2010 8:52 AM  Additional Follow-up for Phone Call Additional follow up Details #1::        begin cipro 500mg  two times a day c/b 3/27 d/c hyomax if not helping Additional Follow-up by: Louis Meckel MD,  February 03, 2010 8:59 AM    Additional Follow-up for Phone Call Additional follow up Details #2::    Pt. is allergic to Penicillin.Would there be a cross sensitvity? Follow-up by: Teryl Lucy RN,  February 03, 2010 9:56 AM  Additional Follow-up for Phone Call Additional follow up Details #3:: Details for Additional Follow-up Action Taken: no cross sensitivity Additional Follow-up by: Louis Meckel MD,  February 03, 2010 11:14 AM  Pt. contacted about Cipro and PCP had written rx. and she has 9 day supply on hand.Asking if she can just do 9 days tx. and call back in 4-5 days if no better.

## 2010-12-14 NOTE — Progress Notes (Signed)
Summary: Abdominal pain - - sooner appt  Phone Note Call from Patient Call back at Home Phone [phone]   Caller: Patient Summary of Call: patient complains of 2 weeks of abdominal pain, gas and diarrhea. She has some abdominal distension, fever. She has been started on antibiotics by Dr. Regino Schultze. Patient has an appt for 02/11/2010 but does not feel that she can wait that long to see the MD. she had CT at [facility] on Monday and she has already gone to see her GYN.  I have gave the referring records to [facility].  Initial call taken by: Harlow Mares CMA Duncan Dull),  January 25, 2010 2:45 PM  Follow-up for Phone Call        Pt. OV is rescheduled to 01-30-10 at 9am.  She will continue care with Dr.McGough until we can see her. Pt. instructed to call back as needed.  Follow-up by: Laureen Ochs LPN,  January 25, 2010 4:02 PM

## 2010-12-14 NOTE — Assessment & Plan Note (Signed)
  Nurse Visit   Preventive Screening-Counseling & Management  Comments: Pt. ref'd by Dr. Arlyce Dice for [place] IBS-D study.  I spoke with patient at the time and followed up with her again today.  She states that she is not interested in participation.  Allergies: 1)  ! Penicillin 2)  ! Voltaren

## 2010-12-14 NOTE — Assessment & Plan Note (Signed)
Summary: F/U, CONTINUES WITH GERD.          Cheryl Cross   History of Present Illness Visit Type: Follow-up Visit Primary GI MD: Melvia Heaps MD [facility] Primary Provider: Lewis Shock, MD Requesting Provider: Karleen Hampshire, MD Chief Complaint: Patient c/o continued epigastric abdominal burning as well as some nausea. She denies any chest discomfort. She also c/o occasional RUQ abdominal pain and fever History of Present Illness:   [name] has returned for followup of her diarrhea.  On Lotronex diarrhea has significantly subsided.  She has actually had some constipation taking 2 a day.  At the same time she is complaining of burning epigastric pain.  She had this prior to Lotronex but she is now more aware of it.  Omeprazole did not help.  Burning continued with Nexium and, with Nexium, diarrhea worsened.  She denies pyrosis.   GI Review of Systems    Reports abdominal pain, belching, bloating, loss of appetite, and  nausea.     Location of  Abdominal pain: epigastric area.    Denies acid reflux, chest pain, dysphagia with liquids, dysphagia with solids, heartburn, vomiting, vomiting blood, weight loss, and  weight gain.      Reports diarrhea and  irritable bowel syndrome.     Denies anal fissure, black tarry stools, change in bowel habit, constipation, diverticulosis, fecal incontinence, heme positive stool, hemorrhoids, jaundice, light color stool, liver problems, rectal bleeding, and  rectal pain.    Current Medications (verified): 1)  Geritol Complete  Tabs (Iron-Vitamins) .... Once Daily 2)  Citracal/vitamin D 250-200 Mg-Unit Tabs (Calcium Citrate-Vitamin D) .... Once Daily 3)  Aspir-Low 81 Mg Tbec (Aspirin) .... Once Daily 4)  Lexapro 20 Mg Tabs (Escitalopram Oxalate) .... Take 1 Tablet By Mouth Once A Day 5)  Hydrocodone-Acetaminophen 5-325 Mg Tabs (Hydrocodone-Acetaminophen) .... As Needed 6)  Lotronex 1 Mg Tabs (Alosetron Hcl) .Marland Kitchen.. 1 By Mouth Once Daily Can Take Up To 2 If  Needed  Allergies (verified): 1)  ! Penicillin 2)  ! Voltaren  Past History:  Past Medical History: Reviewed history from 01/30/2010 and no changes required. Arthritis Irritable Bowel Syndrome  Past Surgical History: Reviewed history from 01/30/2010 and no changes required. Hysterectomy Spinal Fusion  Family History: Reviewed history from 01/30/2010 and no changes required. Family History of Prostate Cancer:Father Family History of Diabetes: Mother Family History of Heart Disease:Mother  No FH of Colon Cancer:  Social History: Occupation: Runner, broadcasting/film/video Patient currently smokes. 1ppd Alcohol Use - yes-2 glasses per week Daily Caffeine Use -3 Illicit Drug Use - no  Review of Systems       The patient complains of arthritis/joint pain, back pain, fatigue, fever, muscle pains/cramps, night sweats, sleeping problems, and swollen lymph glands.  The patient denies allergy/sinus, anemia, anxiety-new, blood in urine, breast changes/lumps, change in vision, confusion, cough, coughing up blood, depression-new, fainting, headaches-new, hearing problems, heart murmur, heart rhythm changes, itching, menstrual pain, nosebleeds, pregnancy symptoms, shortness of breath, skin rash, sore throat, swelling of feet/legs, thirst - excessive , urination - excessive , urination changes/pain, urine leakage, vision changes, and voice change.    Vital Signs:  Patient profile:   45 year old female Height:      67 inches Weight:      144 pounds BMI:     22.64 BSA:     1.76 Pulse rate:   73 / minute Pulse rhythm:   regular BP sitting:   90 / 60  (left arm)  Vitals Entered By:  Dottie Nelson-Smith CMA Duncan Dull) (Mar 28, 2010 10:10 AM)   Impression & Recommendations:  Problem # 1:  IBS (ICD-564.1) Assessment Improved  Plan to continue Lotronex 1 mg 1-2 times a day  Orders: EGD (EGD)  Problem # 2:  ABDOMINAL PAIN, EPIGASTRIC (ICD-789.06)  Rule out active peptic ulcer disease  Recommendations #1  trial of Zantac 150 mg b.i.d. #2 upper endoscopy  Orders: EGD (EGD)  Patient Instructions: 1)  cc: Dr. Karleen Hampshire 2)  Your Zantac prescription has been sent to your pharmacy 3)  Your EGD is scheduled for 03/30/2010 at 3:30pm 4)  Conscious Sedation brochure given.  5)  Upper Endoscopy brochure given.  6)  The medication list was reviewed and reconciled.  All changed / newly prescribed medications were explained.  A complete medication list was provided to the patient / caregiver. Prescriptions: ZANTAC 150 MG TABS (RANITIDINE HCL) take one tab b.i.d.  #20 x 2   Entered and Authorized by:   Louis Meckel MD   Signed by:   Louis Meckel MD on 03/28/2010   Method used:   Electronically to        [facility] (retail)       [address]       [place], [place]  [address]       Ph: [phone]       Fax: [phone]   RxID:   [id number]

## 2010-12-14 NOTE — Progress Notes (Signed)
Summary: triage  Phone Note Call from Patient Call back at Home Phone [phone]   Caller: Patient Call For: Arlyce Dice Reason for Call: Talk to Nurse Summary of Call: Patient tried Nexium last week and states that it helped a little with the burning in her stomach but worsened her diarrhea so she stopped taking it. Wants to know what should she try. Initial call taken by: Tawni Levy,  Mar 27, 2010 8:47 AM  Follow-up for Phone Call        Pt. has tried Omeprazole, Nexium & Maalox, continues w/GERD. OV moved to 03-28-10 at 9:45am. Follow-up by: Laureen Ochs LPN,  Mar 27, 2010 10:07 AM

## 2010-12-14 NOTE — Procedures (Signed)
Summary: ENDOSCOPY   Patient Name: Cheryl Cross, Cheryl Cross MRN: [id number] Procedure Procedures: Panendoscopy (EGD) CPT: 43235.    with biopsy(s)/brushing(s). CPT: D1846139.  Personnel: Endoscopist: Barbette Hair. Arlyce Dice, MD.  Referred By: Karleen Hampshire, MD.  Exam Location: Exam performed in [facility]. Outpatient  Patient Consent: Procedure, Alternatives, Risks and Benefits discussed, consent obtained, from patient.  Indications Symptoms: Anorexia.  History  Pre-Exam Physical: Performed Aug 21, 2000  Cardio-pulmonary exam, HEENT exam, Abdominal exam, Extremity exam, Neurological exam WNL.  Exam Exam Info: Maximum depth of insertion Duodenum, intended Duodenum. Patient position: on left side. Gastric retroflexion performed. Images taken. ASA Classification: I. Tolerance: excellent.  Sedation Meds: Fentanyl 50 mcg. Versed 5 mg. Cetacaine Spray 1 sprays Robinul 0.2  Monitoring: BP and pulse monitoring done. Oximetry used. Supplemental O2 given  Fluoroscopy: Fluoroscopy was not used.  Findings - MUCOSAL ABNORMALITY: Body to Antrum. Erythematous mucosa. Red spots present. Biopsy/Mucosal Abn taken. ICD9: Gastritis, Acute: 535.00.   Assessment Abnormal examination, see findings above.  Diagnoses: 535.00: Gastritis, Acute.   Events  Unplanned Intervention: No unplanned interventions were required.  Unplanned Events: There were no complications. Plans Medication(s): H2Blocker: Ranitidine/Zantac 150 mg BID, starting Aug 21, 2000   Disposition: After procedure patient sent to recovery. After recovery patient sent home.  Scheduling: Clinic Visit, to Barbette Hair. Arlyce Dice, MD, around Sep 18, 2000.    CC: Delrae Sawyers  This report was created from the original endoscopy report, which was reviewed and signed by the above listed endoscopist.

## 2010-12-14 NOTE — Progress Notes (Signed)
Summary: Triage  Phone Note Call from Patient Call back at Home Phone [phone]   Caller: Patient Call For: Dr. Arlyce Dice Reason for Call: Talk to Nurse Summary of Call: Pt. said everytime she eats her stomach burns. She has had a low grade fever and feels fatique. Initial call taken by: Karna Christmas,  Mar 13, 2010 10:31 AM  Follow-up for Phone Call        Pt has developed new symptoms.  C/O upper abd burning.  Worse after eating.  Cont's to have dairrhea which also burns her rectal area.  Pt is taking lotronex one q hs  and feels that it has helped the urgency of her BMs.  States stools are darker than usual.  Also C/O fatique.  Running low grade temp of 99-100 off and on.  Pt aks if symptoms could be from lotronex? Follow-up by: Ashok Cordia RN,  Mar 13, 2010 10:47 AM  Additional Follow-up for Phone Call Additional follow up Details #1::        I do not think fevers or from Lotronex.  Have her start omeprazole over-the-counter 20 mg daily.  She needs a followup office Additional Follow-up by: Louis Meckel MD,  Mar 13, 2010 10:52 AM    Additional Follow-up for Phone Call Additional follow up Details #2::    Pt notified.  Has return appt sch for 04/05/10.  Pt instructed if no improvement to report back and we can move appt up if needed. Follow-up by: Ashok Cordia RN,  Mar 13, 2010 11:17 AM

## 2010-12-14 NOTE — Procedures (Signed)
Summary: Upper Endoscopy  Patient: Matricia Begnaud Note: All result statuses are Final unless otherwise noted.  Tests: (1) Upper Endoscopy (EGD)   EGD Upper Endoscopy       DONE (C)     [facility]     [address]     [place], [place]  [address]           ENDOSCOPY PROCEDURE REPORT           PATIENT:  Cheryl Cross, Cheryl Cross  MR#:  [id number]     BIRTHDATE:  02-19-1966, 43 yrs. old  GENDER:  female           ENDOSCOPIST:  Barbette Hair. Arlyce Dice, MD     Referred by:           PROCEDURE DATE:  03/30/2010     PROCEDURE:  EGD, diagnostic     ASA CLASS:  Class I     INDICATIONS:  abdominal pain despite treatment Pt has h/o     moderately severe burning epigastric pain unresponsive to PPI     therapy including Nexium, which made her diarrhea worse. Pt has     h/o IBS/diarrhea treated successfully with Lotronex           MEDICATIONS:   Fentanyl 75 mcg IV, Versed 7 mg IV, glycopyrrolate     (Robinal) 0.2 mg IV, 0.6cc simethancone 0.6 cc PO     TOPICAL ANESTHETIC:  Exactacain Spray, benadryl 25mg  IV     (correction)           DESCRIPTION OF PROCEDURE:   After the risks benefits and     alternatives of the procedure were thoroughly explained, informed     consent was obtained.  The LB GIF-H180 [id number] endoscope was     introduced through the mouth and advanced to the third portion of     the duodenum, without limitations.  The instrument was slowly     withdrawn as the mucosa was fully examined.     <<PROCEDUREIMAGES>>           The upper, middle, and distal third of the esophagus were     carefully inspected and no abnormalities were noted. The z-line     was well seen at the GEJ. The endoscope was pushed into the fundus     which was normal including a retroflexed view. The antrum,gastric     body, first and second part of the duodenum were unremarkable (see     image1, image2, image3, image4, image5, image6, image7, image8,     and image9).    Retroflexed views revealed no  abnormalities.     The scope was then withdrawn from the patient and the procedure     completed.           COMPLICATIONS:  None           ENDOSCOPIC IMPRESSION:     1) Normal EGD           No active mucosal disease to explain dypeptic symptoms.  Diarrrhea     worsened with PPI therapy.     RECOMMENDATIONS:     1)Trial of Zantac 150 mg bid     2) Call office next 2-3 days to schedule an office appointment     for 2-3 weeks           REPEAT EXAM:  No           ______________________________     Barbette Hair.  Arlyce Dice, MD           CC:  Karleen Hampshire, MD           n.     REVISED:  04/20/2010 02:11 PM     eSIGNED:   Barbette Hair. Sanay Belmar at 04/20/2010 02:11 PM           [place], [place], [phone]  Note: An exclamation mark (!) indicates a result that was not dispersed into the flowsheet. Document Creation Date: 04/20/2010 2:12 PM _______________________________________________________________________  (1) Order result status: Final Collection or observation date-time: 03/30/2010 15:50 Requested date-time:  Receipt date-time:  Reported date-time:  Referring Physician:   Ordering Physician: Melvia Heaps [id number]) Specimen Source:  Source: Launa Grill Order Number: [id number] Lab site:

## 2010-12-14 NOTE — Letter (Signed)
Summary: Results Letter  [facility]  [address] [place], [place] [address]   Phone: [phone]  Fax: [phone]        February 24, 2010 MRN: [id number]    [facility] [address] [place], [place]  [address]    Dear Ms. Dechert,  Your biopsy results did not show any remarkable findings.  Please continue with the recommendations previously discussed.  Should you have any further questions or immediate concers, feel free to contact me.  Sincerely,  Barbette Hair. Arlyce Dice, M.D., [facility]          Sincerely,  Louis Meckel MD  This letter has been electronically signed by your physician.  Appended Document: Results Letter letter mailed 4.20.11.

## 2011-01-19 ENCOUNTER — Encounter (HOSPITAL_COMMUNITY): Payer: Self-pay | Attending: Oncology

## 2011-01-19 ENCOUNTER — Other Ambulatory Visit: Payer: Self-pay | Admitting: Oncology

## 2011-01-19 DIAGNOSIS — D45 Polycythemia vera: Secondary | ICD-10-CM | POA: Insufficient documentation

## 2011-01-19 LAB — HEMOGLOBIN AND HEMATOCRIT, BLOOD
HCT: 40.8 % (ref 36.0–46.0)
Hemoglobin: 14 g/dL (ref 12.0–15.0)

## 2011-01-25 LAB — HEMOGLOBIN AND HEMATOCRIT, BLOOD: HCT: 41.4 % (ref 36.0–46.0)

## 2011-01-28 LAB — HEMOGLOBIN AND HEMATOCRIT, BLOOD
HCT: 41.9 % (ref 36.0–46.0)
Hemoglobin: 14.5 g/dL (ref 12.0–15.0)

## 2011-01-31 LAB — HEMOGLOBIN AND HEMATOCRIT, BLOOD: HCT: 46 % (ref 36.0–46.0)

## 2011-01-31 LAB — GLUCOSE, CAPILLARY: Glucose-Capillary: 80 mg/dL (ref 70–99)

## 2011-02-13 LAB — HEMOGLOBIN AND HEMATOCRIT, BLOOD: HCT: 46 % (ref 36.0–46.0)

## 2011-02-15 LAB — HEMOGLOBIN AND HEMATOCRIT, BLOOD: HCT: 48.1 % — ABNORMAL HIGH (ref 36.0–46.0)

## 2011-02-18 LAB — HEMOGLOBIN AND HEMATOCRIT, BLOOD
HCT: 30.5 % — ABNORMAL LOW (ref 36.0–46.0)
HCT: 33.7 % — ABNORMAL LOW (ref 36.0–46.0)
Hemoglobin: 10.6 g/dL — ABNORMAL LOW (ref 12.0–15.0)
Hemoglobin: 11.7 g/dL — ABNORMAL LOW (ref 12.0–15.0)

## 2011-02-18 LAB — BASIC METABOLIC PANEL
BUN: 2 mg/dL — ABNORMAL LOW (ref 6–23)
CO2: 26 mEq/L (ref 19–32)
Calcium: 7.3 mg/dL — ABNORMAL LOW (ref 8.4–10.5)
Chloride: 105 mEq/L (ref 96–112)
Chloride: 107 mEq/L (ref 96–112)
Creatinine, Ser: 0.49 mg/dL (ref 0.4–1.2)
Creatinine, Ser: 0.65 mg/dL (ref 0.4–1.2)
GFR calc Af Amer: >60 mL/min (ref >60)
GFR calc Af Amer: >60 mL/min (ref >60)
GFR calc non Af Amer: >60 mL/min (ref >60)
Sodium: 134 mEq/L — ABNORMAL LOW (ref 135–145)

## 2011-02-19 LAB — DIFFERENTIAL
Basophils Relative: 1 % (ref 0–1)
Lymphocytes Relative: 33 % (ref 12–46)
Monocytes Absolute: 0.5 10*3/uL (ref 0.1–1.0)
Monocytes Relative: 8 % (ref 3–12)
Neutro Abs: 3.5 10*3/uL (ref 1.7–7.7)
Neutrophils Relative %: 56 % (ref 43–77)

## 2011-02-19 LAB — CBC
HCT: 49.1 % — ABNORMAL HIGH (ref 36.0–46.0)
Hemoglobin: 16.7 g/dL — ABNORMAL HIGH (ref 12.0–15.0)
MCHC: 34.1 g/dL (ref 30.0–36.0)
MCV: 100.9 fL — ABNORMAL HIGH (ref 78.0–100.0)
Platelets: 338 10*3/uL (ref 150–400)
RDW: 13.5 % (ref 11.5–15.5)

## 2011-02-19 LAB — TYPE AND SCREEN: Antibody Screen: NEGATIVE

## 2011-02-19 LAB — COMPREHENSIVE METABOLIC PANEL
Albumin: 4.6 g/dL (ref 3.5–5.2)
Alkaline Phosphatase: 66 U/L (ref 39–117)
BUN: 6 mg/dL (ref 6–23)
Calcium: 9.4 mg/dL (ref 8.4–10.5)
Creatinine, Ser: 0.6 mg/dL (ref 0.4–1.2)
Glucose, Bld: 94 mg/dL (ref 70–99)
Potassium: 4 mEq/L (ref 3.5–5.1)
Total Protein: 7 g/dL (ref 6.0–8.3)

## 2011-02-19 LAB — URINALYSIS, ROUTINE W REFLEX MICROSCOPIC
Bilirubin Urine: NEGATIVE
Ketones, ur: NEGATIVE mg/dL
Nitrite: NEGATIVE
Specific Gravity, Urine: 1.01 (ref 1.005–1.030)
Urobilinogen, UA: 0.2 mg/dL (ref 0.0–1.0)

## 2011-02-19 LAB — PROTIME-INR: INR: 0.9 (ref 0.00–1.49)

## 2011-02-19 LAB — APTT: aPTT: 30 seconds (ref 24–37)

## 2011-02-19 LAB — HEMOGLOBIN AND HEMATOCRIT, BLOOD: HCT: 48.4 % — ABNORMAL HIGH (ref 36.0–46.0)

## 2011-02-27 LAB — HEMOGLOBIN AND HEMATOCRIT, BLOOD
HCT: 51.5 % — ABNORMAL HIGH (ref 36.0–46.0)
Hemoglobin: 17.4 g/dL — ABNORMAL HIGH (ref 12.0–15.0)

## 2011-03-27 NOTE — Op Note (Signed)
NAMEMarland Kitchen  OLETA, GUNNOE NO.:  [id number]   MEDICAL RECORD NO.:  [id number]          PATIENT TYPE:  INP   LOCATION:  5029                         FACILITY:  [facility]   PHYSICIAN:  Balinda Quails, M.D.    DATE OF BIRTH:  1966/08/20   DATE OF PROCEDURE:  05/11/2009  DATE OF DISCHARGE:                               OPERATIVE REPORT   SURGEON:  Balinda Quails, MD   CO-SURGEON:  Jene Every, MD   ANESTHETIC:  General endotracheal.   PREOPERATIVE DIAGNOSIS:  L5-S1 degenerative disk disease.   POSTOPERATIVE DIAGNOSIS:  L5-S1 degenerative disk disease.   PROCEDURE:  L5-S1 anterior lumbar interbody fusion (ALIF).   CLINICAL NOTE:  Cheryl Cross is a 45 year old female with chronic  back pain based at degenerative disease at L5-S1.  Scheduled to undergo  L5-S1 ALIF.  The patient was seen preoperatively.  No history of DVT or  pulmonary embolus.  Intact lower extremity pulses.   Details of the operative procedure were reviewed with the patient  preoperatively.  She consented for surgery.  Potential complications  reviewed including but not limited to bleeding, transfusion, limb  ischemia, DVT, pulmonary embolus, ureter injury, hernia, or other  complication.   OPERATIVE PROCEDURE:  The patient was brought to the operating room in  stable condition.  Placed in supine position.  General endotracheal  anesthesia was induced.  Foley catheter, arterial line, and central  venous catheter were in place.  Pulse oximetry on the left lower  extremity.  The abdomen was prepped and draped in the sterile fashion.  Transverse skin incision was made in the left lower quadrant at the  projection of L5-S1.  Subcutaneous tissue was divided with  electrocautery.  Left anterior rectus sheath was incised from midline to  lateral margin.  The rectus muscle was then mobilized.  The rectus  muscle was retracted medially.  It was quite stuck due to previous  cesarean section.  Once the  muscle was fully mobilized, left  retroperitoneal space was entered.  The peritoneal contents in ureter  were pushed forward.  The left common external iliac artery were  identified.  The lymphatics were pushed laterally.  The L5-S1 disk space  could be palpated, and the soft tissue was pushed off the L5-S1 disk  space.  The middle sacral vessels were identified and double clipped and  divided.  The soft tissues were pushed off to the right to allow full  exposure of L5-S1.  Using a [employer] reverse-lipped blade system,  the reverse-lipped blades were placed on the lateral margins of L5-S1  bilaterally.  Malleable retractors were placed superiorly and  inferiorly.  This allowed complete exposure of the L5-S1 disk for Dr.  Shelle Iron to complete the instrumentation.   There were no apparent complications during the exposure or procedure.  Details of closure were dictated separately by Dr. Shelle Iron.      Balinda Quails, M.D.  Electronically Signed     PGH/MEDQ  D:  05/11/2009  T:  05/12/2009  Job:  [id number]

## 2011-03-27 NOTE — Discharge Summary (Signed)
NAMEMarland Kitchen  Cheryl Cross, Cheryl Cross            ACCOUNT NO.:  [id number]   MEDICAL RECORD NO.:  [id number]          PATIENT TYPE:  INP   LOCATION:  5029                         FACILITY:  [facility]   PHYSICIAN:  Jene Every, M.D.    DATE OF BIRTH:  1966/01/31   DATE OF ADMISSION:  05/11/2009  DATE OF DISCHARGE:  05/13/2009                               DISCHARGE SUMMARY   ADMISSION DIAGNOSES:  1. Degenerative disk disease, L5-S1.  2. Polycythemia.  3. Depression.  4. Anxiety.   DISCHARGE DIAGNOSES:  1. Degenerative disk disease, L5-S1.  2. Polycythemia.  3. Depression.  4. Anxiety.  5. Status post anterior lumbar fusion at L5-S1.   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  She has a history of disk degeneration.  She has undergone  epidural steroid injections with only short-term relief.  She has  disabling low back pain.  MRI does show end-stage disk degeneration at 5-  1.  Exam does reveal decreased range of motion, negative straight leg  raise, felt at this point is disabling nature of her pain.  She will  benefit from a anterior lumbar fusion.  The risks and benefits of this  were discussed with the patient and she does wish to proceed.   CONSULTS:  PT, OT, Care Management.   PROCEDURE:  The patient was taken to the OR on June 30, underwent  anterior lumbar fusion at L5-S1.  Approach was done by Dr. Liliane Bade.   PRIMARY SURGEON:  Jene Every, MD   ASSISTANT:  Roma Schanz, PA-C   ANESTHESIA:  General.   COMPLICATIONS:  None.   LABORATORY DATA:  Preoperative CBC showed a white cell count of 6.2,  hemoglobin 16.7, and hematocrit 49.1.  These were monitored throughout  the hospital course.  Hemoglobin remained stable at the time of  discharge at 10.6 and hematocrit of 30.5.  Preoperative chemistry showed  sodium 137 and potassium 4.0.  Normal BUN and creatinine.  These were  monitored throughout the hospital course.  Sodium did drop  to 134, but  normalized at the time of discharge to 138, potassium is 3.4 at the time  of discharge.  She continued to have normal renal function.  Preoperative urinalysis was negative.  Preoperative chest x-ray showed  no acute cardiopulmonary processes.  She did have postoperative lumbar  films, which showed she had good placement of the pacer.   HOSPITAL COURSE:  The patient was admitted, taken to the OR, and  underwent the above-stated procedure without difficulty.  She was then  transferred to the PACU and then to the orthopedic floor for continued  postop care.  The patient was placed on PCA for postoperative pain  relief.  PT/OT was initiated.  The patient did very well  postoperatively.  She was ambulating without difficulty and voiding  without difficulty.  All central lines were discontinued.  Foley was  discontinued postop day #1.  The patient continued to do well.  Pain was  well controlled on p.o. analgesics.  Postop day #2, the patient was  stable and felt  ready for discharge.   DISPOSITION:  The patient was discharged home with home health PT and OT  need.   ACTIVITY:  She is to walk as tolerated utilizing the brace.   WOUND CARE:  Change her dressing daily.  Keep this area clean and dry.   DIET:  As tolerated.   CONDITION ON DISCHARGE:  Stable.   FINAL DIAGNOSIS:  Doing well, status post anterior lumbar fusion.      Roma Schanz, P.A.      Jene Every, M.D.  Electronically Signed    CS/MEDQ  D:  06/14/2009  T:  06/14/2009  Job:  [id number]

## 2011-03-27 NOTE — Consult Note (Signed)
VASCULAR SURGERY CONSULTATION   Cheryl Cross, FATA  DOB:  09/11/1966                                       03/17/2009  EAVWU#:[id number]   REFERRING PHYSICIAN:  Jene Every, M.D.   PRIMARY CARE PHYSICIAN:  Kirk Ruths, M.D., [facility].   HEMATOLOGIST:  Genene Churn. Cyndie Chime, M.D.   REFERRAL DIAGNOSIS:  L5-S1 degenerative disk disease.   HISTORY:  The patient is a 45 year old female with a history of chronic  back pain.  Degenerative disease at L5-S1.  Scheduled at this time to  undergo L5-S1 anterior lumbar interbody fusion.  Seen today for  preoperative evaluation and discussion of planned surgery.   The patient has a history of hysterectomy carried out through a  Pfannenstiel incision.  Also polycythemia vera followed by Dr.  Cyndie Chime.   No history of DVT.  No history of circulatory problems.   PAST MEDICAL HISTORY:  1. Polycythemia vera.  2. Hysterectomy.   MEDICATIONS:  1. Lexapro 10 mg daily.  2. Geritol one pill daily.   FAMILY HISTORY:  Mother living age 35 with a history of an MI at the age  of 4.  Father living age 54 without major health problems.  One sister  with a history of mitral valve prolapse and arrhythmias.   SOCIAL HISTORY:  The patient is single.  No children.  She is a Runner, broadcasting/film/video  at [employer].  Smokes one pack of cigarettes daily.  Will  drink alcohol occasionally.   REVIEW OF SYSTEMS:  The patient does note symptoms of irritable bowel  syndrome.  Chronic leg pain.  Joint discomfort and muscle pain.   ALLERGIES:  Penicillin.   PHYSICAL EXAMINATION:  General:  A well-appearing 45 year old female.  Alert and oriented, in no distress.  Vital signs:  BP is 128/82.  Pulse  is 80 per minute.  Neck:  Supple.  No thyromegaly or adenopathy.  Chest:  Equal entry bilaterally without rales or rhonchi.  Cardiovascular:  No  carotid bruits.  Normal heart sounds without murmurs.  Regular rate and  rhythm.  No  gallops or rubs.  Abdomen:  Soft, nontender.  No  organomegaly or masses.  Pfannenstiel incision noted.  Lower  extremities:  Intact femoral, popliteal, posterior tibial and dorsalis  pedis pulses.  No ankle edema.   IMPRESSION:  1. L5-S1 degenerative disk disease.  2. Polycythemia vera.  3. Status post hysterectomy.   RECOMMENDATIONS:  The patient is an acceptable candidate for L5-S1 ALIF  (anterior lumbar interbody fusion).  Details of the operative procedure  reviewed with the patient at this time.  Potential complications  reviewed including but not limited to vessel injury, bleeding, limb  ischemia, DVT, pulmonary embolus, ureter injury, hernia, infection or  other major complication.   Balinda Quails, M.D.  Electronically Signed  PGH/MEDQ  D:  03/17/2009  T:  03/18/2009  Job:  [id number]   cc:   Genene Churn. Cyndie Chime, M.D.  Kirk Ruths, M.D.  Jene Every, M.D.

## 2011-03-27 NOTE — H&P (Signed)
NAMEMarland Kitchen  Cheryl Cross, Cheryl Cross            ACCOUNT NO.:  [id number]   MEDICAL RECORD NO.:  [id number]          PATIENT TYPE:  INP   LOCATION:  NA                           FACILITY:  [facility]   PHYSICIAN:  Jene Every, M.D.    DATE OF BIRTH:  1966/09/05   DATE OF ADMISSION:  05/11/2009  DATE OF DISCHARGE:                              HISTORY & PHYSICAL   CHIEF COMPLAINT:  Back and bilateral lower extremity pain.   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  She has a history of disk degeneration.  She has had  epidural steroids last year, which were successful but gave her minimal  relief.  She notes severe disabling back and lower extremity symptoms.  We did obtain an MRI of the lumbar spine, which demonstrates end-stage  disk degeneration at 5-1 with mild changes in 4-5.  Exam does reveal a  decrease in range of lumbar spine, negative straight leg raise  bilaterally.  At this point, the patient describes her pain as disabling  in nature and felt she would benefit from anterior lumbar fusion.  The  risks and benefits of this were discussed with the patient and her need  for smoking cessation.  The patient is aware of her risks, and she  wishes to proceed with the anterior lumbar fusion.   MEDICAL HISTORY:  Significant for:  1. Polycythemia.  2. Depression/anxiety.   CURRENT MEDICATIONS:  1. Lexapro 10 mg 1 p.o. daily.  2. Flexeril p.r.n.  3. Vicodin p.r.n.   ALLERGIES:  Penicillin allergy, she is unsure of reaction.   Past surgical history include hysterectomy in 2006.   SOCIAL HISTORY:  The patient is separated.  She is a Runner, broadcasting/film/video.  She  smokes 1-1/2 to 2 packets of cigarettes per day for the past 20 years.  Occasional alcohol consumption is noted.  Primary care physician is Dr.  Regino Schultze; hematologist, Dr. Cyndie Chime.  The patient did have phlebotomy  done 1 week ago.   FAMILY HISTORY:  Mother is 44 with history of hypertension, coronary  artery disease, diabetes.  Father is 59 with a history of high blood  pressure and prostate cancer.   REVIEW OF SYSTEMS:  GENERAL:  The patient denies any fever, chills,  night sweats, or bleeding tendencies.  CNS:  No blurred or double  vision, seizure, headache, or paralysis.  RESPIRATORY:  No shortness of  breath, productive cough, or hemoptysis.  CARDIOVASCULAR:  Chest pain  and history of orthopnea.  GU:  No dysuria, hematuria, or discharge.  GI:  No nausea, vomiting, diarrhea, constipation, melena.  MUSCULOSKELETAL:  Pertinent to HPI.   PHYSICAL EXAMINATION:  VITAL SIGNS:  Respiratory rate 12, BP 106/70.  GENERAL:  This is a well-developed, well-nourished female, seen upright  in mild distress.  She does have difficulty getting up from seated  position.  She walks with slight antalgic gait.  HEENT:  Atraumatic, normocephalic.  Pupils equal, round, and reactive to  light.  EOMs intact.  NECK:  Supple.  No lymphadenopathy.  CHEST:  The patient does have wheezes in the  left lower lobe, otherwise,  clear to auscultation.  BREASTS:  Not examined.  Pertinent to HPI.  GU:  Not examined.  Pertinent to HPI.  HEART:  Regular rate and rhythm without murmurs, gallops, or rubs.  ABDOMEN:  Soft, nontender, and nondistended.  Bowel sounds x4.  SKIN:  No rashes or lesions are noted.  BACK:  The patient does have global decrease in range of motion of  lumbar spine.  EXTREMITIES:  She has negative straight leg raise.  No pain with range  of motion of the hip.  The pedal pulses are equal.   IMPRESSION:  End-stage disk degeneration at L5-S1.   PLAN:  The patient will be admitted to [facility] to undergo  anterior lumbar fusion.  She was fitted with a volar brace at today's  appointment.  We will set her up with a bone stimulator postoperatively  due to her history of tobacco use.      Roma Schanz, P.A.      Jene Every, M.D.  Electronically Signed    CS/MEDQ  D:   05/10/2009  T:  05/11/2009  Job:  [id number]

## 2011-03-27 NOTE — Op Note (Signed)
NAMEMarland Kitchen  Cross, Cheryl            ACCOUNT NO.:  [id number]   MEDICAL RECORD NO.:  [id number]          PATIENT TYPE:  INP   LOCATION:  5029                         FACILITY:  [facility]   PHYSICIAN:  Jene Every, M.D.    DATE OF BIRTH:  [birth year]   DATE OF PROCEDURE:  DATE OF DISCHARGE:                               OPERATIVE REPORT   PREOPERATIVE DIAGNOSIS:  Degenerative disk disease, L5-S1.   POSTOPERATIVE DIAGNOSIS:  Degenerative disk disease, L5-S1.   PROCEDURE PERFORMED:  1. Anterior lumbar interbody fusion utilizing Synthes stand alone      lumbar interbody fusion device.  2. Bone grafting with autologous and allograft bone graft.  3. Bone marrow aspirate of the L5 vertebral body.   ANESTHESIA:  General.   ASSISTANT:  Dr. Liliane Bade, anterior approach, Roma Schanz.   BRIEF HISTORY:  A 45 year old end-stage osteoporosis disk degeneration  of L5 with disabling mechanical back pain secondary to diskogenic pain  refractory to conservative treatment.  Neurovascularly intact, negative  straight leg raise.  No nerve compression or radiculopathy,  predominantly back pain.  It was indicated for anterior lumbar and  interbody fusion to treat her diskogenic pain.  Risks and benefits  discussed including bleeding, infection, damage to neurovascular  structures, ureteral injuries, nonunion, malunion, postoperative  anticoagulation, time to union, etc.  DVT, PE, anesthetic complications,  etc.   TECHNIQUE:  The patient in the supine position.  After induction of  adequate anesthesia, 1 g vancomycin, the abdominal region was prepped  and draped in usual sterile fashion.  Under C-arm x-ray, a transverse  incision was made by Dr. Liliane Bade in the left lower quadrant.  She had  a previous history of C-section.  Retroperitoneal approach, self-  retaining retractors identifying the disk space of L5-S1 and a needle  marker identified the center space.  This was in the AP and lateral  plane.   Interbody of L5, we used a Jamshidi needle advanced a cm and aspirated 6  mL of bone marrow.  This was irrigated and bone wax covering the  aspiration spot.  Then, we used a 10 blade after using a trial 12 wide 8-  degree as a template mediolateral cephalad and caudad, incised the  annulus and the disk and then performed a diskectomy utilizing rongeur  pituitaries.  Fairly collapse of significant spurs anteriorly.  We  sequentially distracted the disk space utilizing a series of Bullet  distractors from 9 mm up.  Then we curetted the endplates from the  cartilage preserving the endplates.  We used a combination of ring  curette and a rasp to smooth out any prominences, again without  violating the endplate.  We used  the curved and straight curette  posteriorly verifying the back of the vertebral body by x-ray and  detached the annulus from S1 and L5 so we could distract posteriorly  with a self distractor, and under x-ray following this was no  fishmouthing in the disk space distracted parallel.  Good bleeding  tissue was noted on the endplates.  Used a trial 12, 8-degree, we tried  the 12, 11  degrees and 13, which did not fit.  We used a 12 large and  selected that.  Bone marrow aspirate and a combination of bone graft  from Synthes.  We used the Synthes chronOS granules as well as DBX  putty.  That was mixed with the bone marrow aspirate, then impacted into  the STALIF device.  This was inserted into the disk space utilizing an  inserter, and on her x-ray in the AP and lateral plane, this was  impacted in the satisfactory position distracting the disk space  appropriately.  We then fixed it with 4 cortical screws after using the  appropriate awl.  We used a regular tip screw for S1 and fine tip screw  for the remaining free because of her increased bone density.  A 325 and  the 20 mm was used in S1.  AP and lateral plane, excellent fixation,  excellent contact with the L5 and  S1 endplates.  Prior to doing this and  inserting this, we copiously irrigated and inspection revealed no CSF  leakage or active bleeding.   Next, full inspection reveals a slight blood oozing.  We called Dr.  Madilyn Fireman and Dr. Earlene Plater who came in his absence, and we had 3 small  bleeders that were cauterized.  Following this, this was dried and  copious irrigated and put bone wax over the cancellous surfaces that  removed on spurs anteriorly with the rongeurs off the S1and put some  thrombin-soaked Gelfoam just below S1 vertebral body.   Self-retraining retractors, which had been placed, had been removed and  again no evidence of ureter injury or vascular injury was noted.  Wound  was copiously irrigated and closed the rectus sheath only with #1 Vicryl  in figure-of-eight sutures.  Closure only not injuring the tissue  beneath that.  Two locking running #1 to the midline from lateral and  medial cross tying that with surgeon's knot.  Then, multiple layers in  the subcu 0 and 2-0 and skin was reapproximated with 4-0 absorbable  subcuticular suture.  The wounds were dressed with Steri-Strips.   Throughout the case, there was a pulse ox on the left foot and was  measuring 100 throughout and good blood flow after release of  the  retractors.   X-rays were taken of the abdomen and no foreign bodies were obtained.   The patient tolerated the procedure well.  There were no complications.   Assistant was [employer].      Jene Every, M.D.  Electronically Signed     JB/MEDQ  D:  05/11/2009  T:  05/12/2009  Job:  [id number]   cc:   Balinda Quails, M.D.

## 2011-03-30 NOTE — H&P (Signed)
NAME:  Cheryl Cross, Cheryl Cross            ACCOUNT NO.:  [id number]   MEDICAL RECORD NO.:  [id number]          PATIENT TYPE:  AMB   LOCATION:  DAY                          FACILITY:  [facility]   PHYSICIAN:  Howard C. Mezer, M.D.  DATE OF BIRTH:  12-19-1965   DATE OF ADMISSION:  04/05/2005  DATE OF DISCHARGE:                                HISTORY & PHYSICAL   ADMITTING DIAGNOSIS:  Dysmenorrhea.   HISTORY OF PRESENT ILLNESS:  The patient is a 45 year old nulligravid white  female with a long history of dysmenorrhea and endometriosis diagnosed by  laparoscopy, admitted for a hysterectomy.  The patient has a long history of  dysmenorrhea and some dyspareunia, and on laparoscopic evaluation in 2002  was found to have multiple implants of endometriosis in the cul-de-sac and  endometriosis on both ovaries.  All of the endometriosis was lasered.  The  patient's symptoms were moderately well controlled on oral contraceptives,  but these needed to be stopped at age 85 secondary to continued smoking.  The patient has had increasing dysmenorrhea over the last year, and was  considering tubal ligation or hysterectomy.  The pros and cons of both were  discussed in detail, and the patient has elected to proceed with  hysterectomy.  She understands that this will result in permanent  sterilization, and that she will have no menstrual periods.  Hysterectomy,  probably leaving the cervix, has been discussed in detail.  Potential  complications including, but not limited to, anesthesia, injury to the  bowel, bladder, ureters, possible fistula formation, possible blood loss  with transfusion and its sequelae, and possible infection in the pelvis and  the wound have been discussed in detail.  The ovaries will be removed, one  or both, if there is significant pathology or surgical indication to remove  the ovaries.  Possible long-term bleeding from the cervix if it is left, and  possible dyspareunia have been  discussed with the patient.  The potential  need for removing the cervix in the future has also been discussed.  Increased potential for complications secondary to the patient's heavy  cigarette smoking has been discussed and accepted by the patient.  Postoperative expectations and restrictions have been reviewed in detail.  Pain control has been discussed.  The patient has a history of polycythemia  vera, and Dr. Cyndie Chime feels that this disease should not negatively  impact the patient's surgery or recovery.  The patient appears to understand  the proposed surgery, as well as the alternatives.   PAST MEDICAL HISTORY:   SURGICAL:  1.  Diagnostic laparoscopy.  2.  Urethral dilation.  3.  TMJ.   MEDICAL:  Polycythemia vera.   MEDICATIONS:  None.   ALLERGIES:  Questionable to PENICILLIN.   SMOKING:  Smokes 2 packs of cigarettes a day.   ETOH:  Social plus.   FAMILY HISTORY:  Positive for cancer of the prostate, stomach, bone, and  pancreas.   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   PHYSICAL EXAMINATION:  HEENT:  Negative.  LUNGS:  Clear.  HEART:  Without murmurs.  BREASTS:  Without masses or discharge, but with cystic changes present.  ABDOMEN:  Soft and nontender.  PELVIC:  EGBUS, vagina, and cervix normal.  The uterus is retroverted, top  normal in size, and tender.  The adnexa are without palpable masses.  EXTREMITIES:  Negative.   IMPRESSION:  1.  Dysmenorrhea.  2.  History of endometriosis.  3.  Polycythemia vera.  4.  Cigarette abuse.   PLAN:  Hysterectomy with possible bilateral salpingo-oophorectomy.      HCM/MEDQ  D:  04/04/2005  T:  04/04/2005  Job:  [id number]   cc:   Genene Churn. Cyndie Chime, M.D.  [address] [facility]  [place]  [place] [address]  Fax: [phone]   Kirk Ruths, M.D.  [address]  [place]  [place] [address]  Fax: [phone]

## 2011-03-30 NOTE — Op Note (Signed)
NAME:  Cheryl Cross, Cheryl Cross            ACCOUNT NO.:  [id number]   MEDICAL RECORD NO.:  [id number]          PATIENT TYPE:  OBV   LOCATION:  1614                         FACILITY:  [facility]   PHYSICIAN:  Leatha Gilding. Mezer, M.D.  DATE OF BIRTH:  [birth year]   DATE OF PROCEDURE:  04/06/2005  DATE OF DISCHARGE:                                 OPERATIVE REPORT   PREOPERATIVE DIAGNOSIS:  Dysmenorrhea.   POSTOPERATIVE DIAGNOSIS:  Dysmenorrhea.   OPERATION PERFORMED:  Supracervical hysterectomy.   SURGEON:  Leatha Gilding. Mezer, M.D.   ASSISTANT:  Leona Singleton, M.D.   ANESTHESIA:  General endotracheal.   PREPARATION:  Betadine.   PROCEDURE:  With the patient in the supine position, prepped and draped in  routine fashion. A Pfannenstiel incision was through skin and subcutaneous  tissue. The fascia and peritoneum were opened without difficulty. Brief  expiration of upper abdomen was benign. Exploration of the pelvis revealed  the uterus to be top normal in size. There was scarring in the cul-de-sac  with the bowel being advanced but no signs of new endometriosis. The ovaries  were normal bilaterally. The round ligaments were suture ligated with  normochromic and divided. The anterior leaflet was opened, and the bladder  taken down sharply and bluntly. The bladder was significantly adherent to  the lower uterus and cervix, and the lateral dissection was a little bit  more difficult than usual. The uterine artery and veins were clamped, cut,  and suture ligated with #1 chromic, the cardinal ligaments taken in one  bite, clamped cut and suture ligated with #1 chromic. The peritoneum on the  back of the cervix was released and taken down to protect the bowel. The  uterus and upper cervix were then amputated with cautery. The cervical stump  was then cauterized, and the endocervical canal cauterized with care not to  harm the vagina. The cervical stump was closed over it with interrupted  figure-of-eight  #1 chromic sutures. Great care was taken to achieve  hemostasis at the bladder base, and the bladder was then reapproximated with  the posterior peritoneum, using a running 3-0 Vicryl suture. There were  multiple areas of oozing with all of the peritoneal edges appearing to bleed  some. This was very carefully attended to, to secure complete hemostasis.  The pelvis was irrigated with copious amounts of warm lactated ringers  solution. The ureters were inspected and found to be out of harm's way, and  with hemostasis intact, an effort was made to the place the large bowel in  the cul-de-sac, the omentum was brought down, and the abdomen was closed in  layers. There was a pesky bleeding area at the apex of the peritoneum upon  entering where hemostasis was felt to be secured. This was re-inspected very  carefully in closing. There was an area of continued bleeding that was  found, and this was arrested with cautery. The peritoneum was closed with a  running 2-0 Vicryl suture, the fascia with running 0 Vicryl to the midline  bilaterally. Hemostasis was achieved in the subcutaneous tissue, and the  skin was closed with  staples. The estimated blood loss was approximately 100  cc. The sponge, needle, and instrument counts were correct x2. The patient  tolerated the procedure well and was taken to the recovery room in  satisfactory condition.      HCM/MEDQ  D:  04/06/2005  T:  04/06/2005  Job:  [id number]   cc:   Leona Singleton, M.D.  [address]  [place]  [place] [address]  Fax: [phone]   Genene Churn. Cyndie Chime, M.D.  [address] [facility]  [place]  [place] [address]  Fax: [phone]   Kirk Ruths, M.D.  [address]  [place]  [place] [address]  Fax: [phone]

## 2011-04-20 ENCOUNTER — Other Ambulatory Visit: Payer: Self-pay | Admitting: Oncology

## 2011-04-20 ENCOUNTER — Ambulatory Visit (HOSPITAL_COMMUNITY): Payer: Self-pay | Attending: Oncology

## 2011-04-20 DIAGNOSIS — D45 Polycythemia vera: Secondary | ICD-10-CM | POA: Insufficient documentation

## 2011-06-12 ENCOUNTER — Other Ambulatory Visit: Payer: Self-pay | Admitting: Oncology

## 2011-06-12 ENCOUNTER — Encounter (HOSPITAL_BASED_OUTPATIENT_CLINIC_OR_DEPARTMENT_OTHER): Payer: BC Managed Care – PPO | Admitting: Oncology

## 2011-06-12 DIAGNOSIS — D45 Polycythemia vera: Secondary | ICD-10-CM

## 2011-06-12 LAB — CBC WITH DIFFERENTIAL/PLATELET
BASO%: 0.6 % (ref 0.0–2.0)
Eosinophils Absolute: 0.2 10*3/uL (ref 0.0–0.5)
HCT: 40 % (ref 34.8–46.6)
LYMPH%: 28.9 % (ref 14.0–49.7)
MCHC: 33.5 g/dL (ref 31.5–36.0)
MCV: 98.2 fL (ref 79.5–101.0)
MONO#: 0.7 10*3/uL (ref 0.1–0.9)
MONO%: 6.7 % (ref 0.0–14.0)
NEUT%: 61.8 % (ref 38.4–76.8)
Platelets: 302 10*3/uL (ref 145–400)
RBC: 4.08 10*6/uL (ref 3.70–5.45)

## 2011-06-12 LAB — FERRITIN: Ferritin: 16 ng/mL (ref 10–291)

## 2011-06-12 LAB — IRON AND TIBC
%SAT: 13 % — ABNORMAL LOW (ref 20–55)
TIBC: 293 ug/dL (ref 250–470)

## 2011-07-11 ENCOUNTER — Other Ambulatory Visit (HOSPITAL_COMMUNITY): Payer: Self-pay | Admitting: Family Medicine

## 2011-07-11 DIAGNOSIS — G8929 Other chronic pain: Secondary | ICD-10-CM

## 2011-07-11 DIAGNOSIS — R109 Unspecified abdominal pain: Secondary | ICD-10-CM

## 2011-07-11 DIAGNOSIS — N390 Urinary tract infection, site not specified: Secondary | ICD-10-CM

## 2011-07-12 ENCOUNTER — Ambulatory Visit (HOSPITAL_COMMUNITY): Payer: BC Managed Care – PPO

## 2011-07-13 ENCOUNTER — Ambulatory Visit (HOSPITAL_COMMUNITY)
Admission: RE | Admit: 2011-07-13 | Discharge: 2011-07-13 | Disposition: A | Discharge status: Home / Self Care | Payer: BC Managed Care – PPO | Source: Ambulatory Visit | Attending: Family Medicine | Admitting: Family Medicine

## 2011-07-13 DIAGNOSIS — N390 Urinary tract infection, site not specified: Secondary | ICD-10-CM

## 2011-07-13 DIAGNOSIS — R319 Hematuria, unspecified: Secondary | ICD-10-CM | POA: Insufficient documentation

## 2011-07-13 DIAGNOSIS — R109 Unspecified abdominal pain: Secondary | ICD-10-CM

## 2011-07-13 DIAGNOSIS — G8929 Other chronic pain: Secondary | ICD-10-CM

## 2011-07-13 IMAGING — US US ABDOMEN COMPLETE
1 series · 14 of 25 positions shown · non-contrast
Comparison: [DATE]

CLINICAL DATA: UTI, abdominal pain

ULTRASOUND ABDOMEN:
TECHNIQUE: Sonography of upper abdominal structures was performed.

[Series 1: us abdomen complete · 0.28mm/px · 14 of 84 slices shown]
[im 1/84]
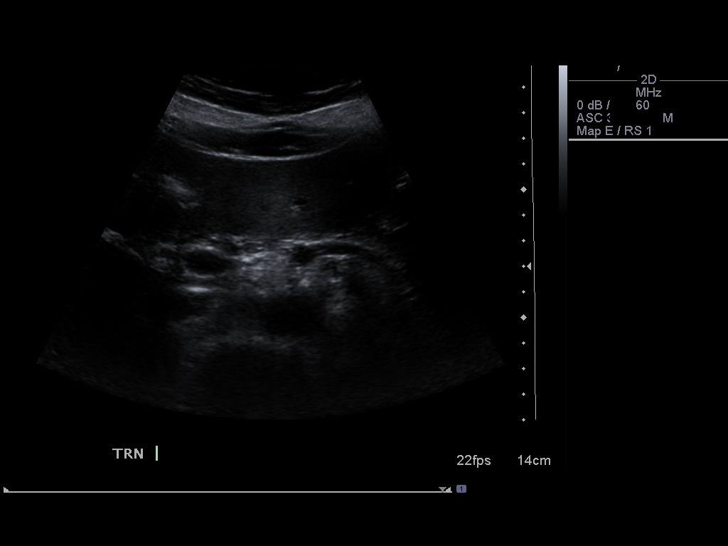
[im 7/84]
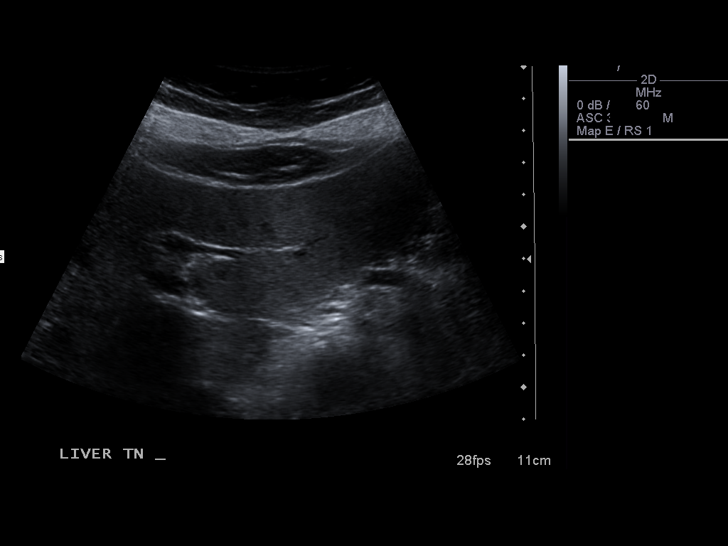
[im 14/84]
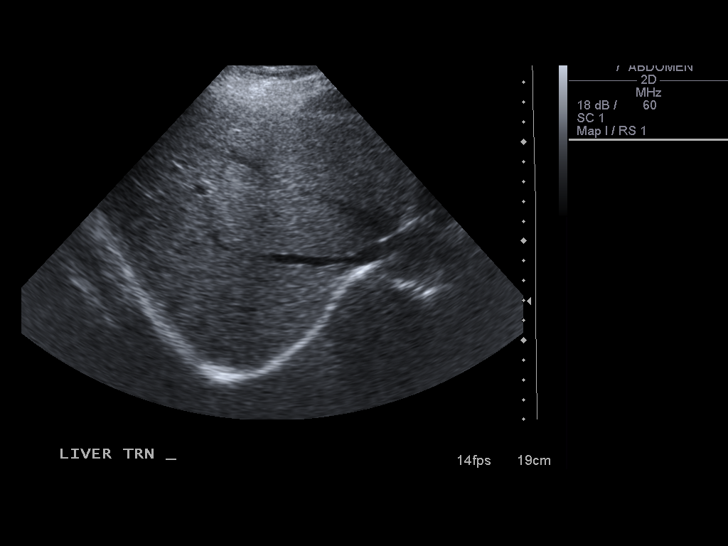
[im 21/84]
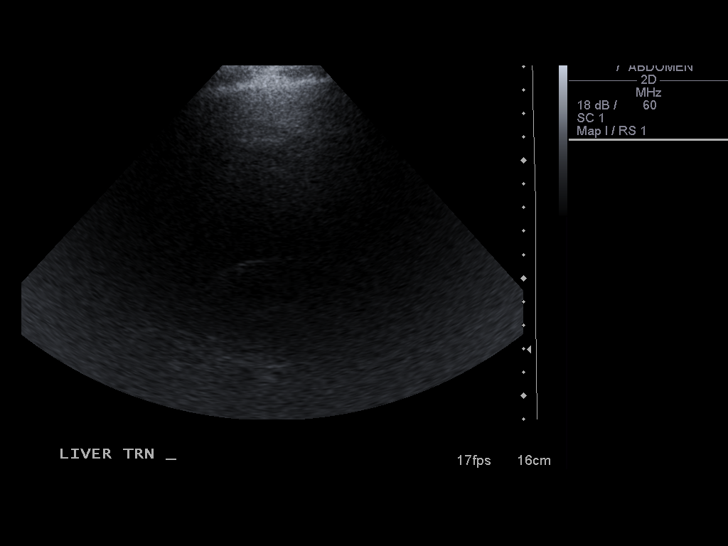
[im 28/84]
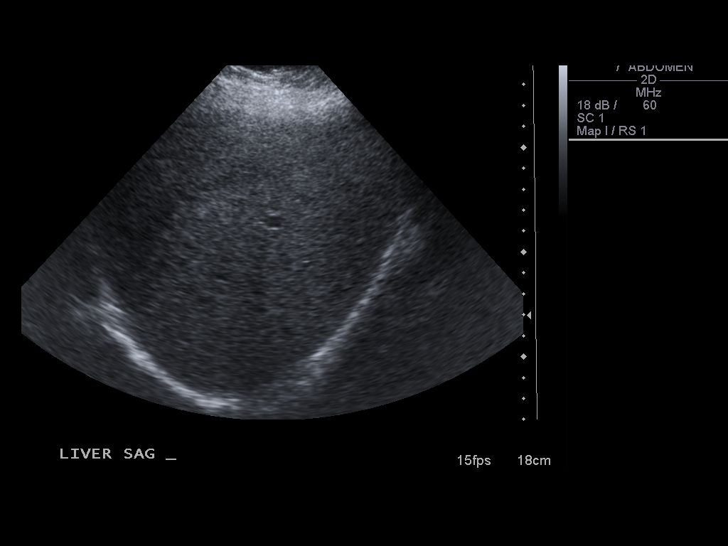
[im 32/84]
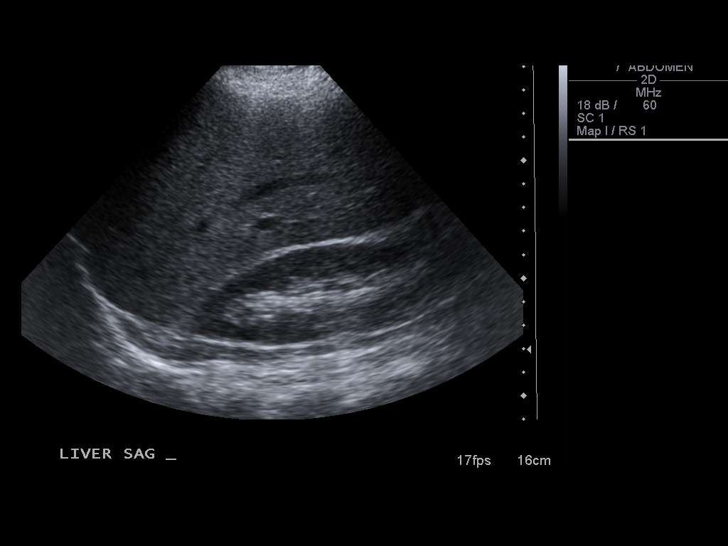
[im 39/84]
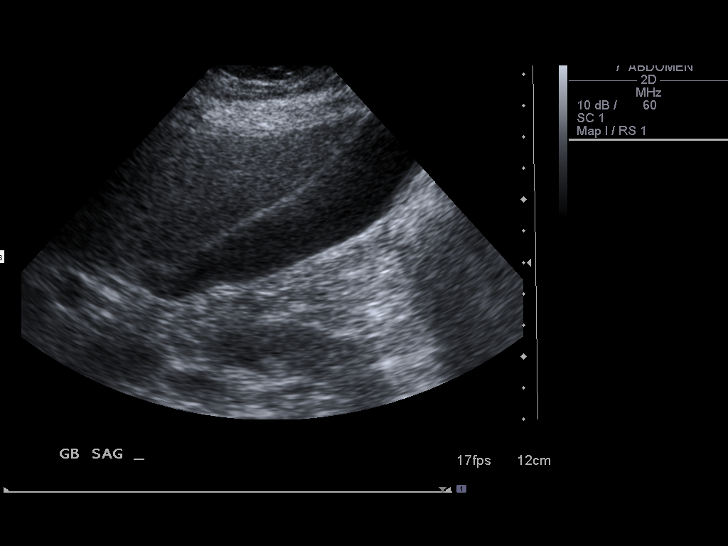
[im 45/84]
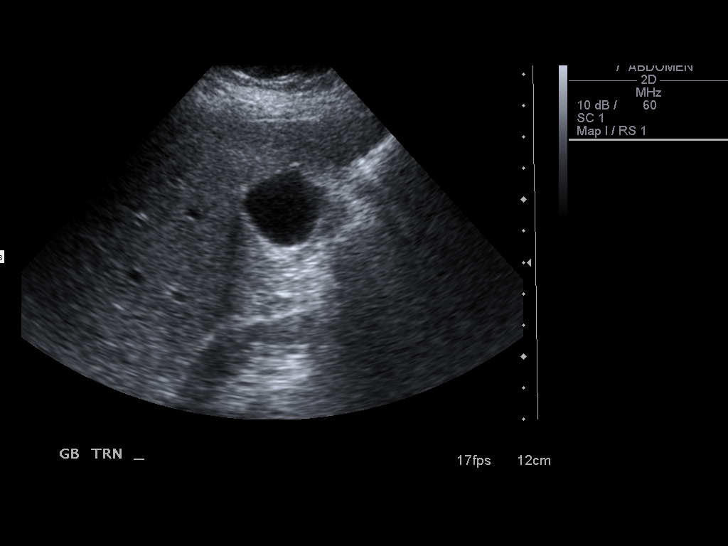
[im 52/84]
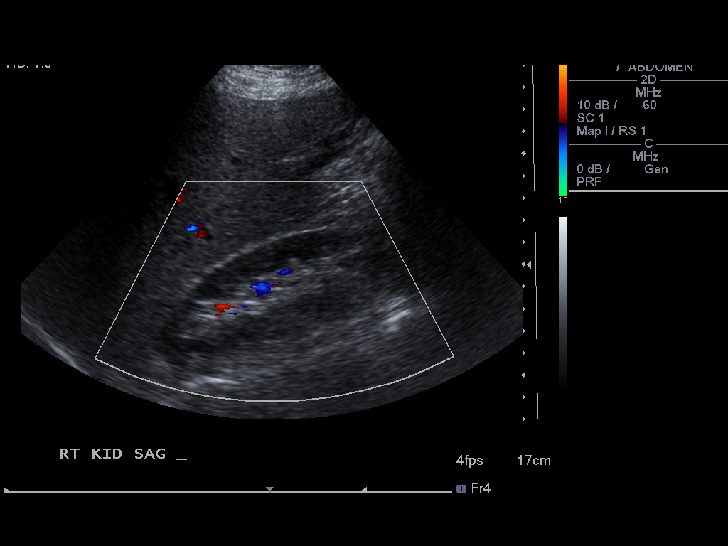
[im 56/84]
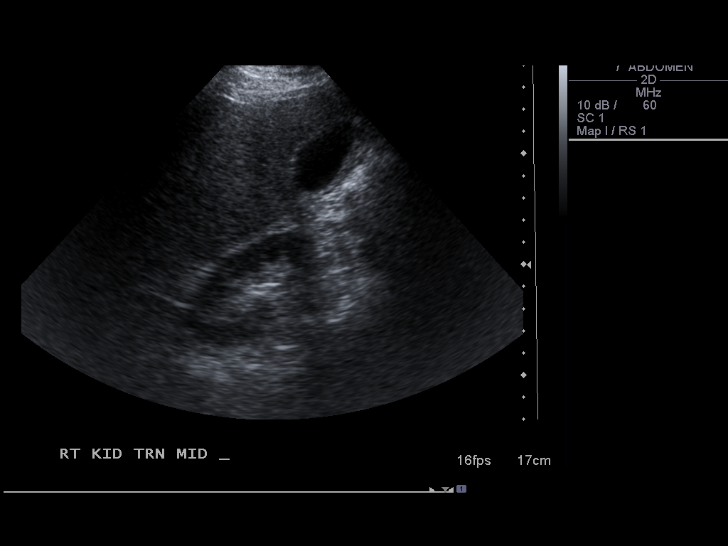
[im 63/84]
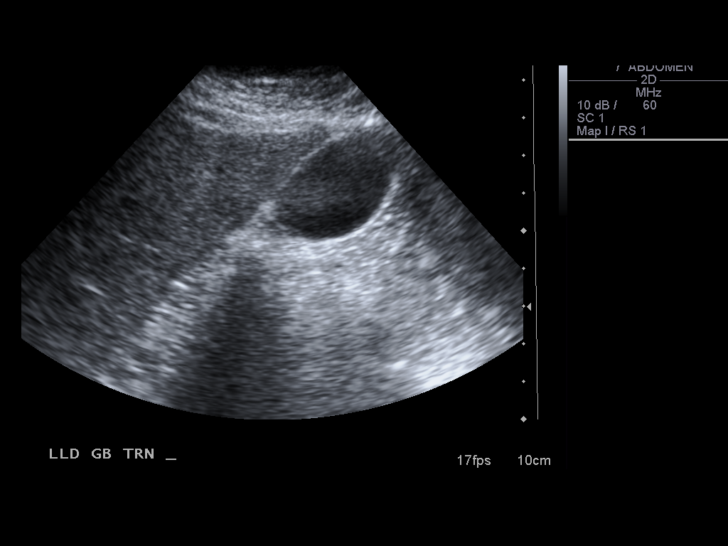
[im 70/84]
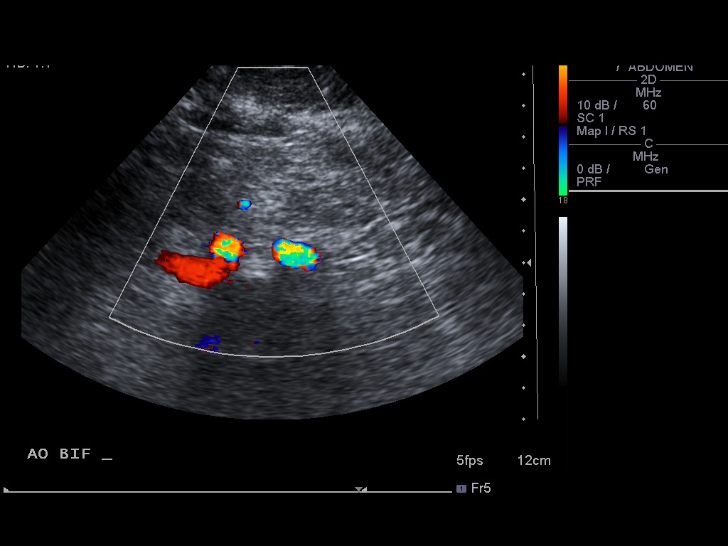
[im 77/84]
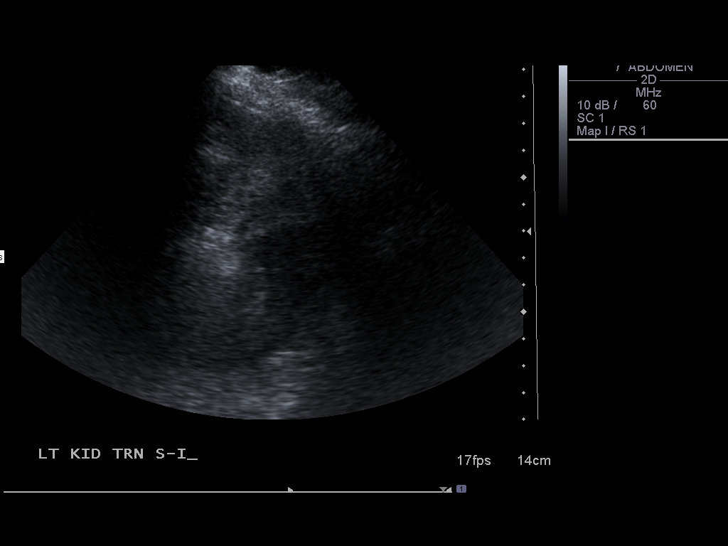
[im 84/84]
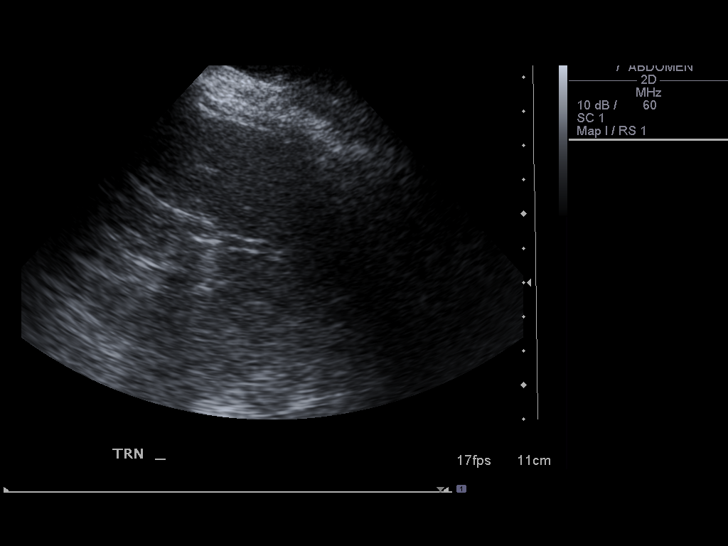

[14 of 25 positions shown; findings below may reference images not displayed]

Gallbladder:  Normally distended without stones or wall thickening.
No pericholecystic fluid or sonographic Murphy sign.

Common bile duct:  5 mm diameter, normal

Liver:  Echogenic, likely fatty infiltration, though this can be
seen with cirrhosis and certain infiltrative disorders.

IVC:  Normal appearance

Pancreas:  Normal appearance

Spleen:  Normal appearance, 5.9 cm length

Right kidney:  12.3 cm length. Normal morphology without mass or
hydronephrosis.

Left kidney:  12.1 cm length. Question cyst of upper pole 2.1 x
cm, not well delineated and third dimension. Otherwise normal
morphology.  No hydronephrosis.

Aorta:  Normal caliber.  Atherosclerotic plaque identified.

Other:  No free fluid.
IMPRESSION: Question small left renal cyst.
Otherwise negative exam.

## 2011-07-17 ENCOUNTER — Other Ambulatory Visit (HOSPITAL_COMMUNITY): Payer: Self-pay | Admitting: Family Medicine

## 2011-07-17 DIAGNOSIS — R10819 Abdominal tenderness, unspecified site: Secondary | ICD-10-CM

## 2011-07-19 ENCOUNTER — Ambulatory Visit (HOSPITAL_COMMUNITY)
Admission: RE | Admit: 2011-07-19 | Discharge: 2011-07-19 | Disposition: A | Discharge status: Home / Self Care | Payer: BC Managed Care – PPO | Source: Ambulatory Visit | Attending: Family Medicine | Admitting: Family Medicine

## 2011-07-19 ENCOUNTER — Other Ambulatory Visit (HOSPITAL_COMMUNITY): Payer: Self-pay | Admitting: Family Medicine

## 2011-07-19 DIAGNOSIS — R10819 Abdominal tenderness, unspecified site: Secondary | ICD-10-CM

## 2011-07-19 DIAGNOSIS — N949 Unspecified condition associated with female genital organs and menstrual cycle: Secondary | ICD-10-CM | POA: Insufficient documentation

## 2011-07-19 DIAGNOSIS — N83209 Unspecified ovarian cyst, unspecified side: Secondary | ICD-10-CM | POA: Insufficient documentation

## 2011-07-19 IMAGING — US US TRANSVAGINAL NON-OB
1 series · 13 of 25 positions shown · non-contrast
Comparison: None.

CLINICAL DATA: Pelvic pressure and tenderness

TRANSABDOMINAL AND TRANSVAGINAL ULTRASOUND OF PELVIS
TECHNIQUE: Both transabdominal and transvaginal ultrasound
examinations of the pelvis were performed. Transabdominal technique
was performed for global imaging of the pelvis including uterus,
ovaries, adnexal regions, and pelvic cul-de-sac.

[Series 1: us transvaginal non-ob · 0.26mm/px · 13 of 44 slices shown]
[im 1/44]
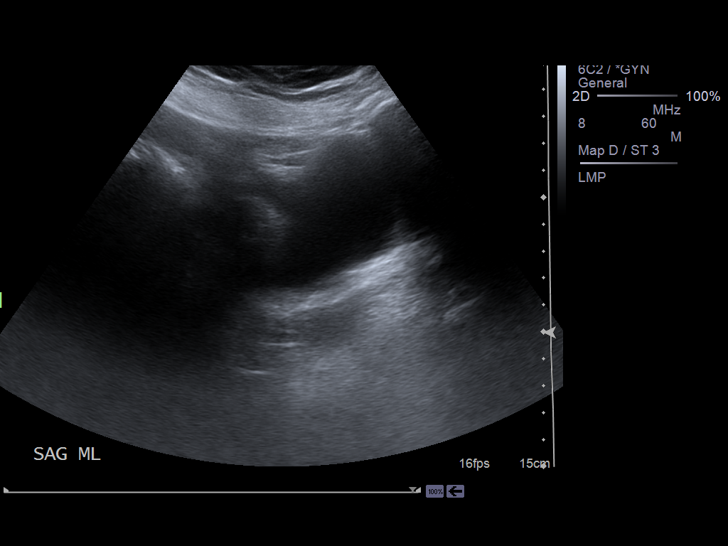
[im 4/44]
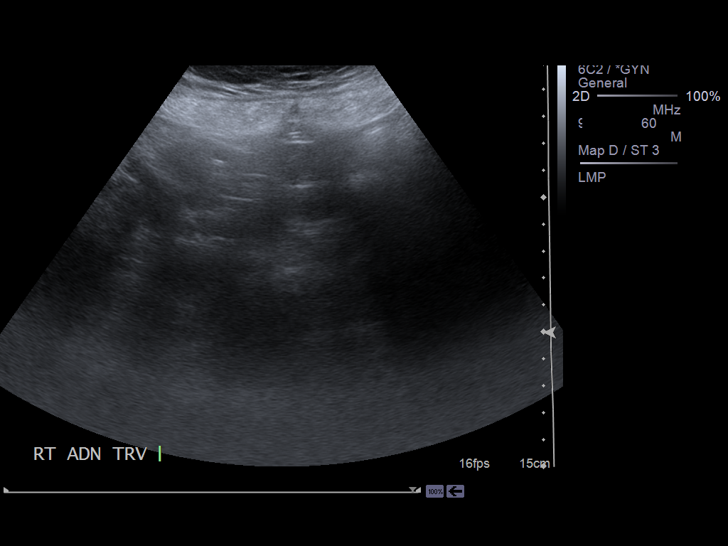
[im 8/44]
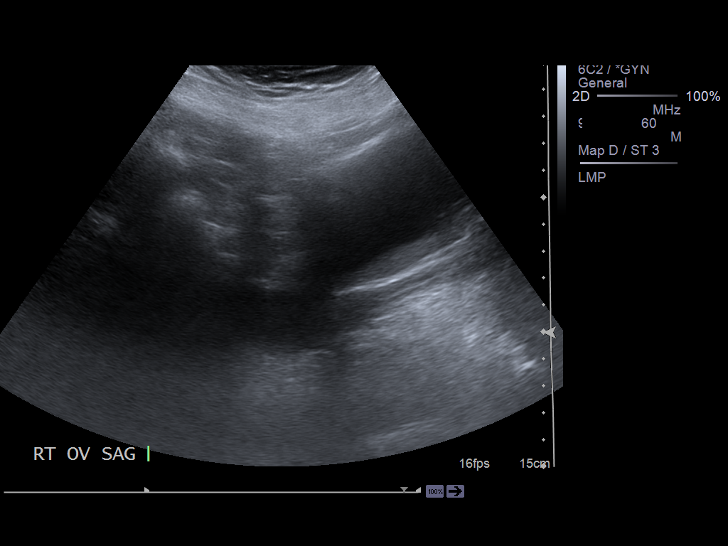
[im 11/44]
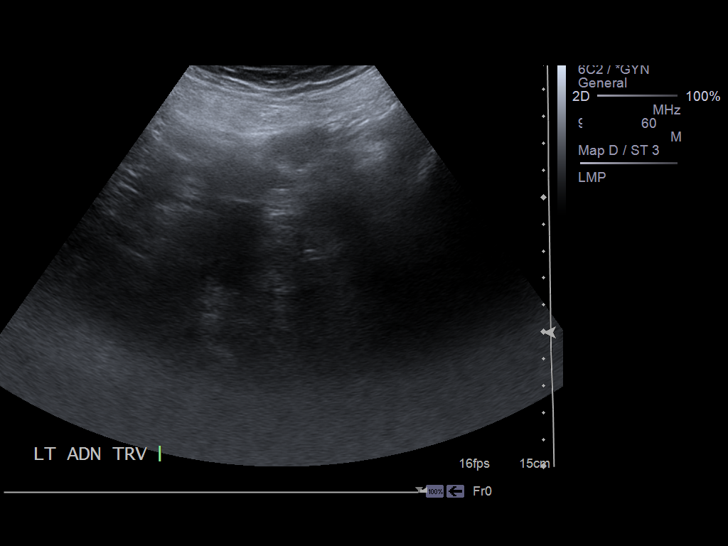
[im 15/44]
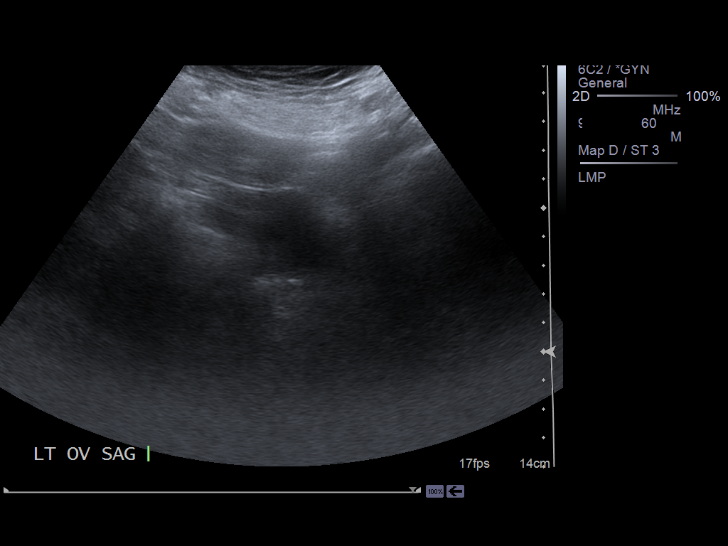
[im 18/44]
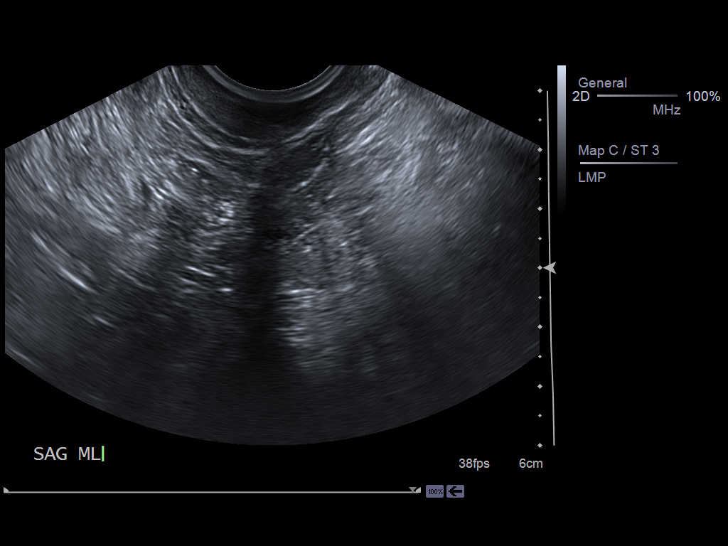
[im 22/44]
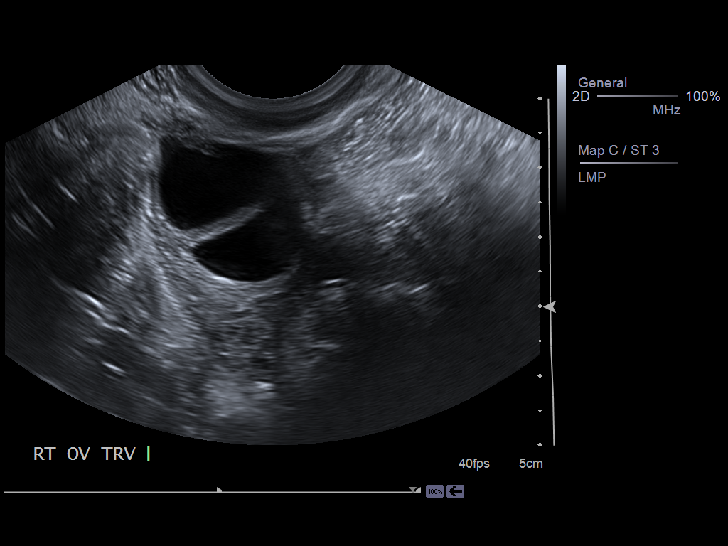
[im 26/44]
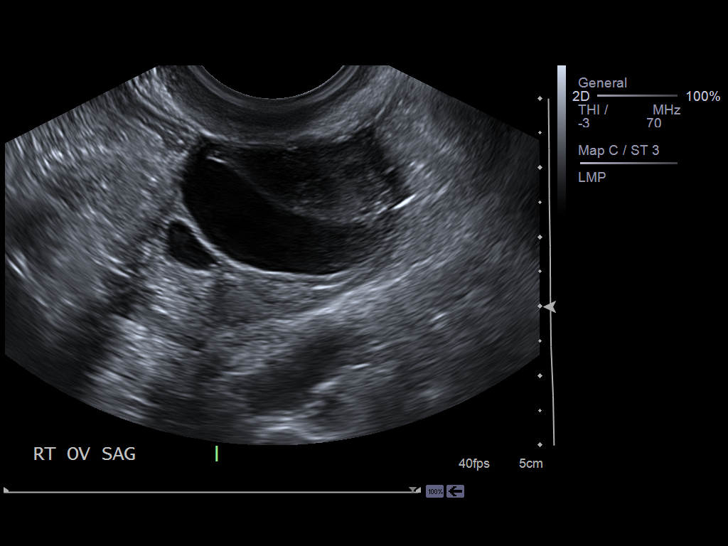
[im 29/44]
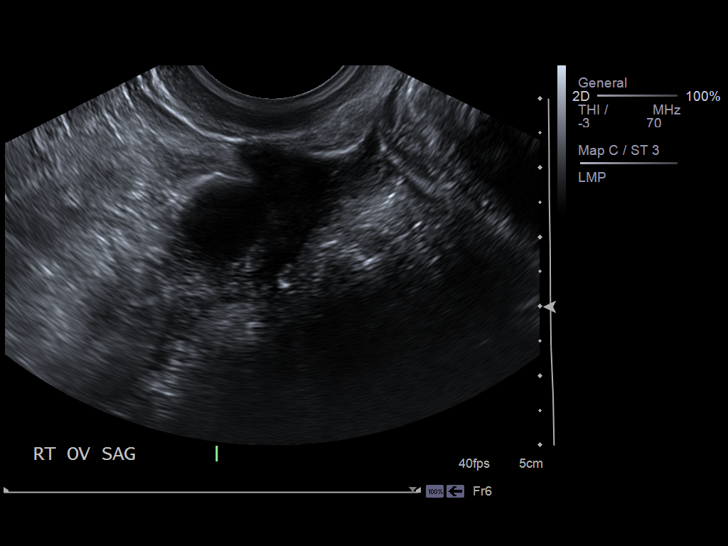
[im 33/44]
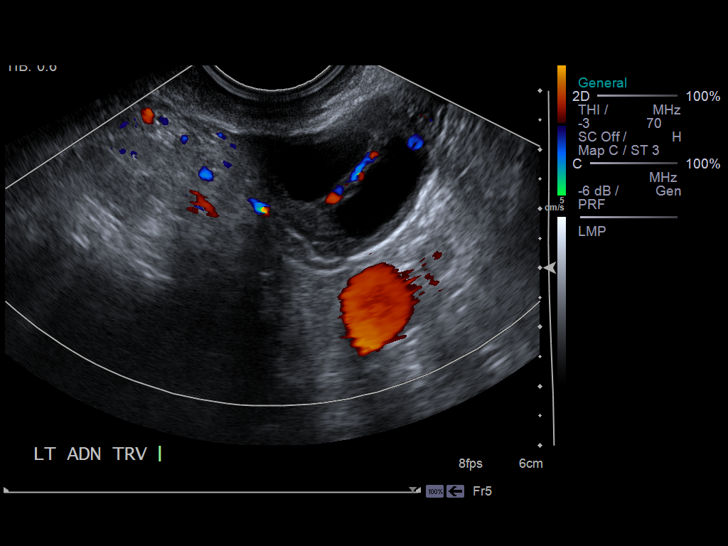
[im 36/44]
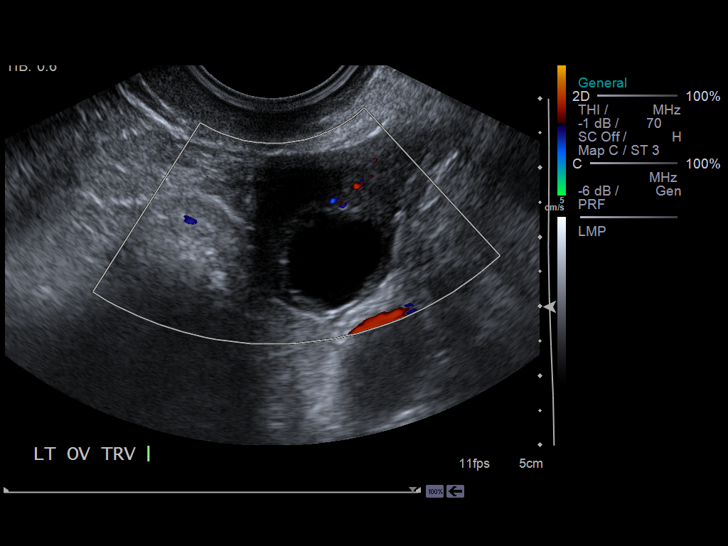
[im 40/44]
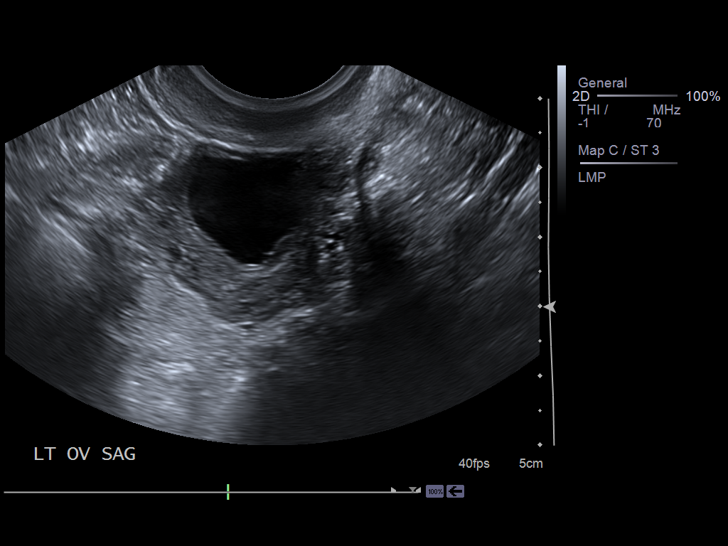
[im 44/44]
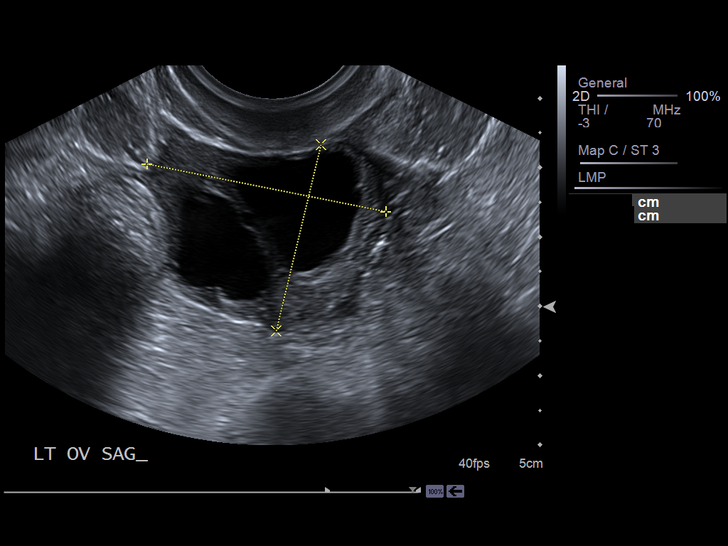

[13 of 25 positions shown; findings below may reference images not displayed]

It was necessary to proceed with endovaginal exam following the
transabdominal exam to visualize the adnexa.
FINDINGS: The uterus is surgically absent.

The right ovary measures 4.0 x 2.4 x 2.7 cm and contains multiple
simple cysts.  There is also a complex cyst that measures 3 cm with
a solid appearing mural components which may represent retracting
clot in a hemorrhagic cyst.

The left ovary is normal in size and appearance and contain several
simple cysts, all less than 2 cm.  The left ovarian dimensions are
3.5 x 2.8 x 2.9 cm.

There is no free pelvic fluid.
IMPRESSION: There is a 3 cm complex cyst on the right ovary which contains a
solid appearing mural component.  Though this may represent
retracting clot in a hemorrhagic follicle, follow-up ultrasound in
6-8 weeks is recommended to document resolution.

## 2011-07-31 ENCOUNTER — Other Ambulatory Visit: Payer: Self-pay | Admitting: Family Medicine

## 2011-07-31 DIAGNOSIS — Z1231 Encounter for screening mammogram for malignant neoplasm of breast: Secondary | ICD-10-CM

## 2011-08-02 LAB — CBC
HCT: 48.5 — ABNORMAL HIGH
Hemoglobin: 17 — ABNORMAL HIGH
Platelets: 320
WBC: 11 — ABNORMAL HIGH

## 2011-08-06 ENCOUNTER — Ambulatory Visit: Payer: BC Managed Care – PPO

## 2011-08-06 LAB — HEMOGLOBIN AND HEMATOCRIT, BLOOD: HCT: 49.9 — ABNORMAL HIGH

## 2011-08-08 LAB — HEMOGLOBIN AND HEMATOCRIT, BLOOD: Hemoglobin: 17 — ABNORMAL HIGH

## 2011-08-08 LAB — FERRITIN: Ferritin: 10 (ref 10–291)

## 2011-08-10 LAB — HEMOGLOBIN AND HEMATOCRIT, BLOOD: HCT: 51.3 — ABNORMAL HIGH

## 2011-08-14 ENCOUNTER — Ambulatory Visit
Admission: RE | Admit: 2011-08-14 | Discharge: 2011-08-14 | Disposition: A | Discharge status: Home / Self Care | Payer: BC Managed Care – PPO | Source: Ambulatory Visit | Attending: Family Medicine | Admitting: Family Medicine

## 2011-08-14 DIAGNOSIS — Z1231 Encounter for screening mammogram for malignant neoplasm of breast: Secondary | ICD-10-CM

## 2011-08-14 IMAGING — MG MM DIGITAL SCREENING {BCG}
4 series · 4 of 4 positions shown · non-contrast
Comparison: Prior studies.

DG SCREEN MAMMOGRAM BILATERAL
Bilateral CC and MLO view(s) were taken.

DIGITAL SCREENING MAMMOGRAM WITH CAD:

[R CC]
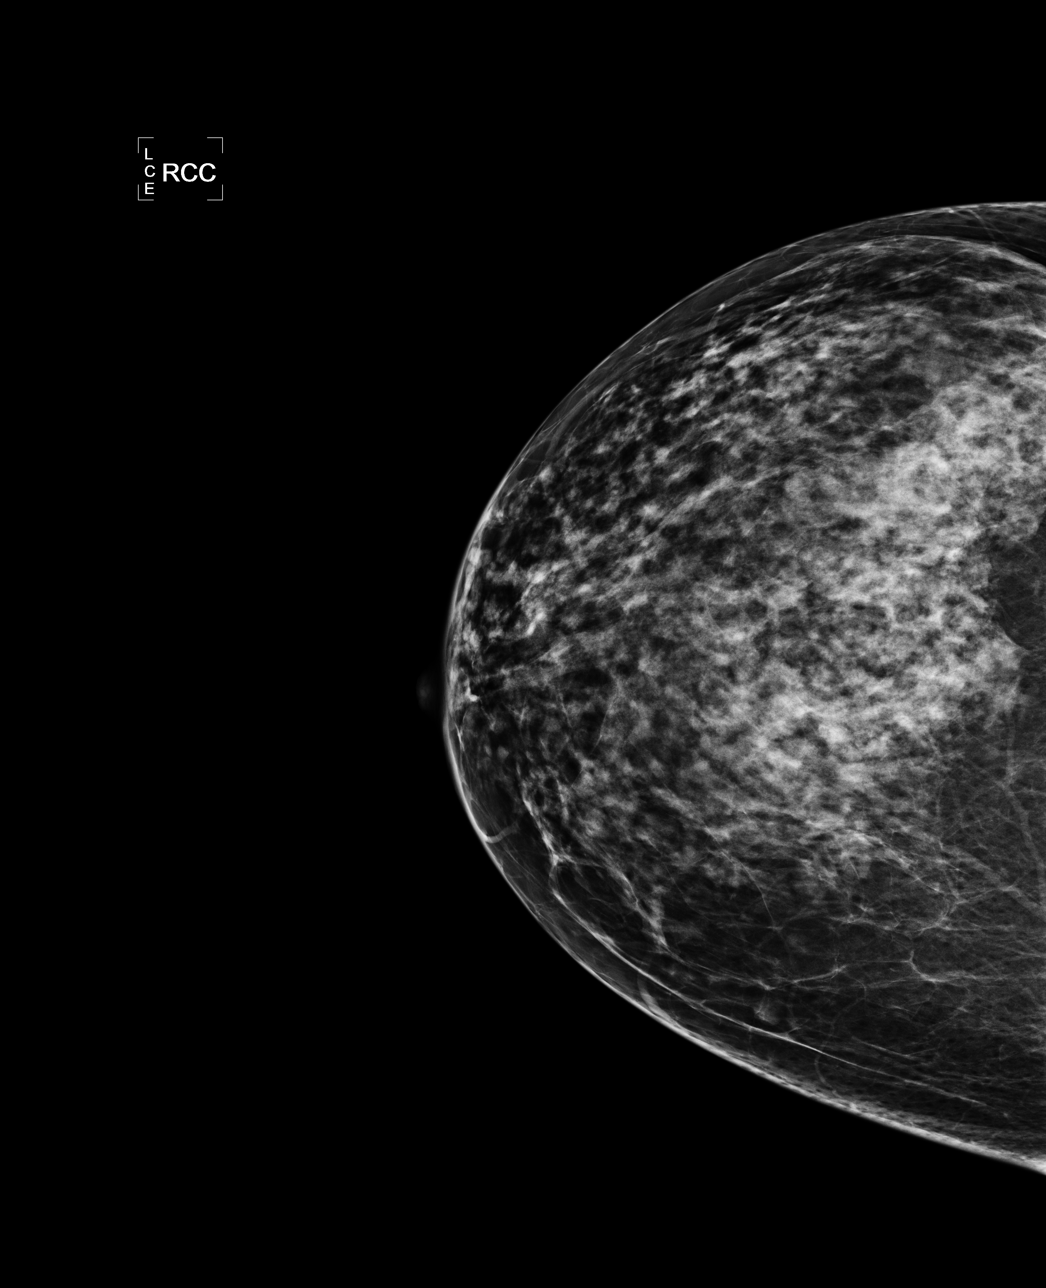

[L CC]
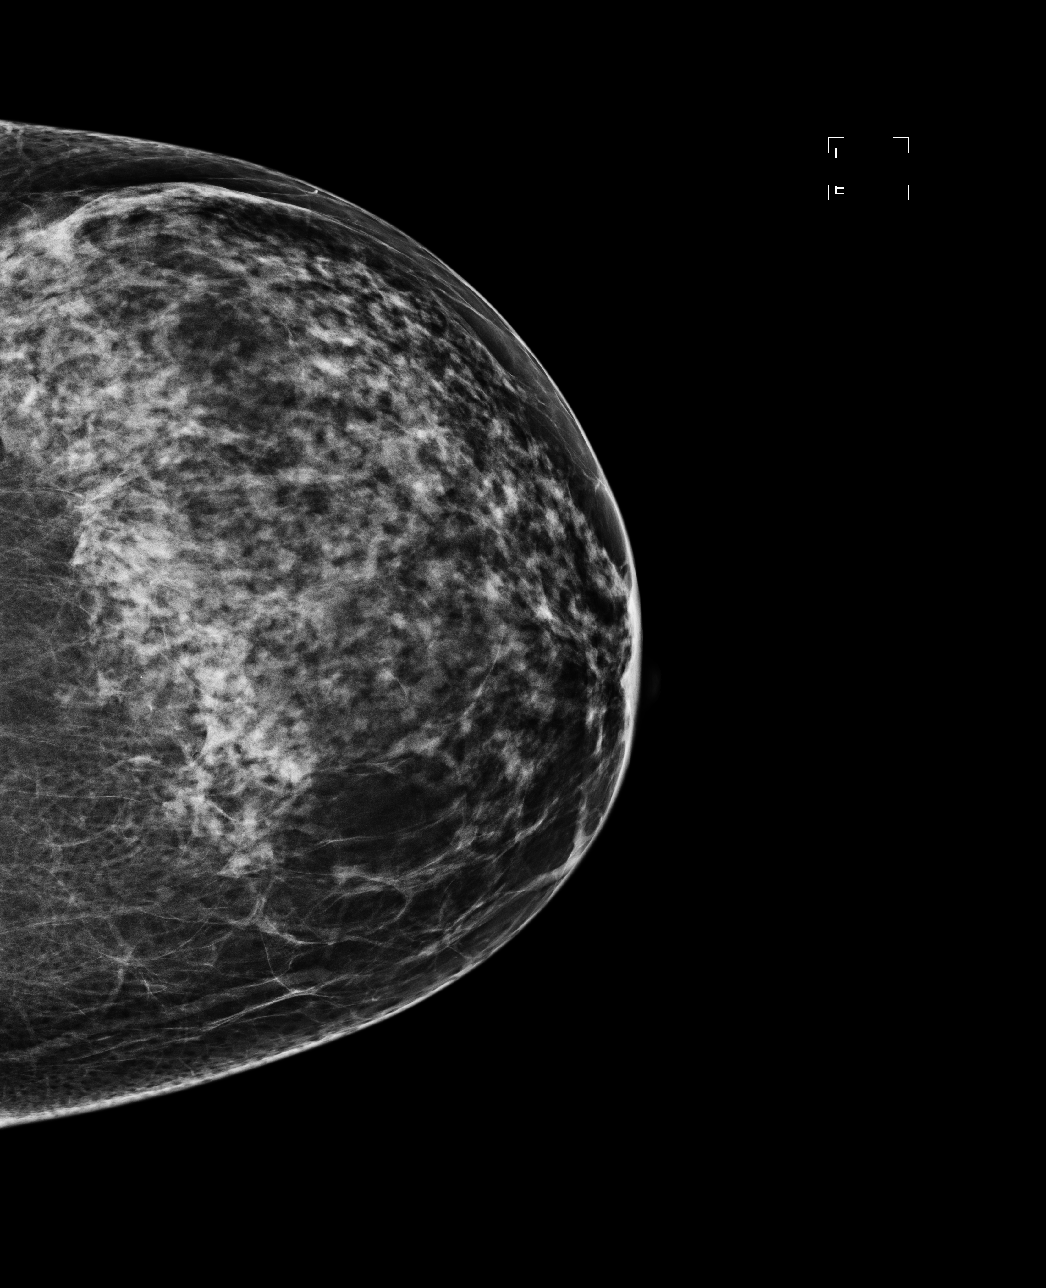

[L MLO]
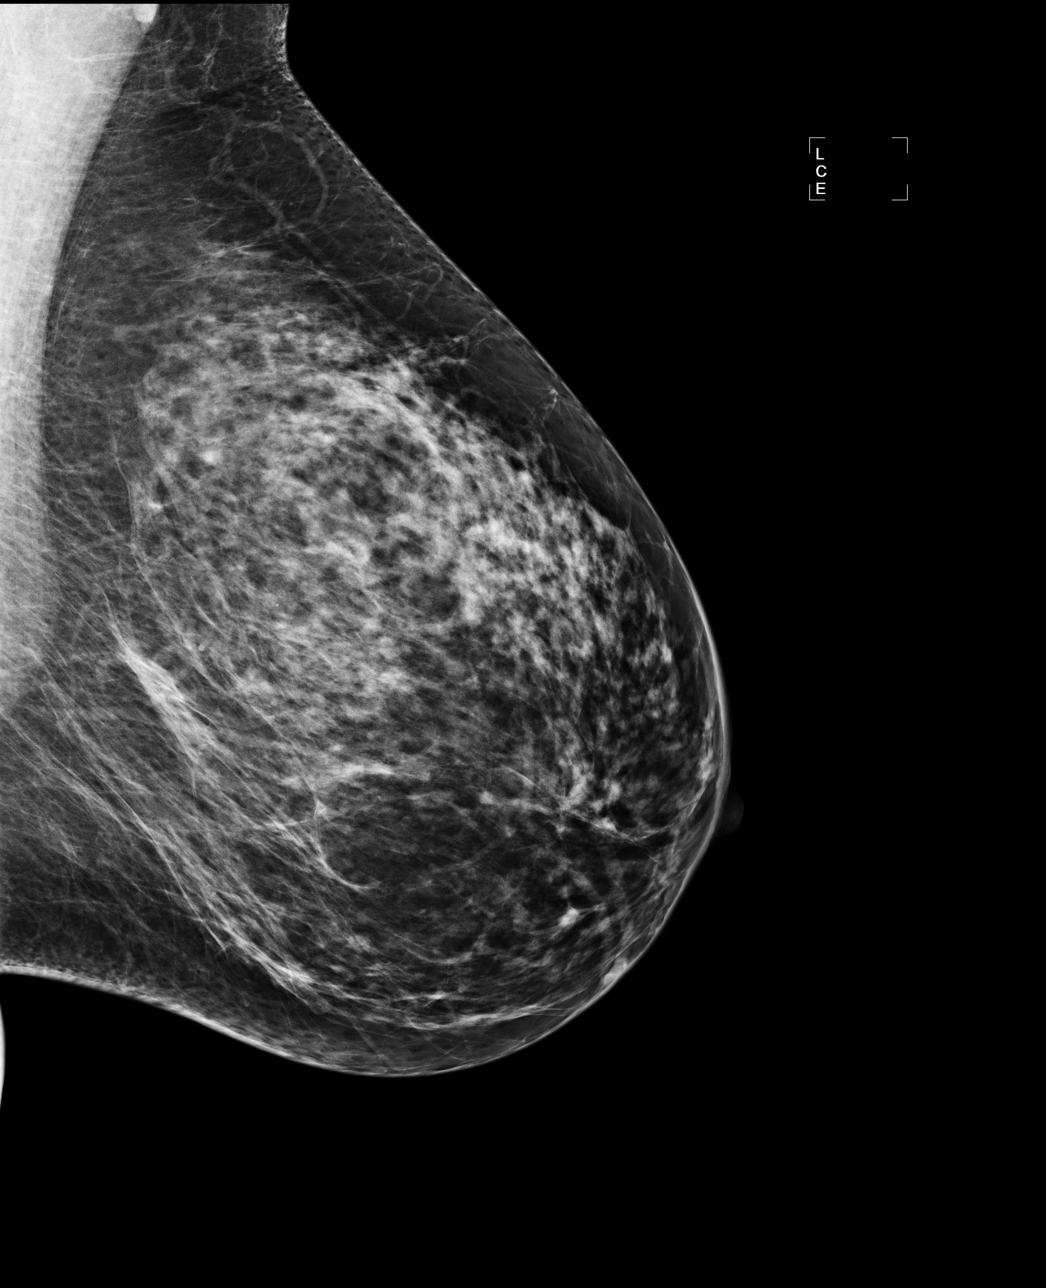

[R MLO]
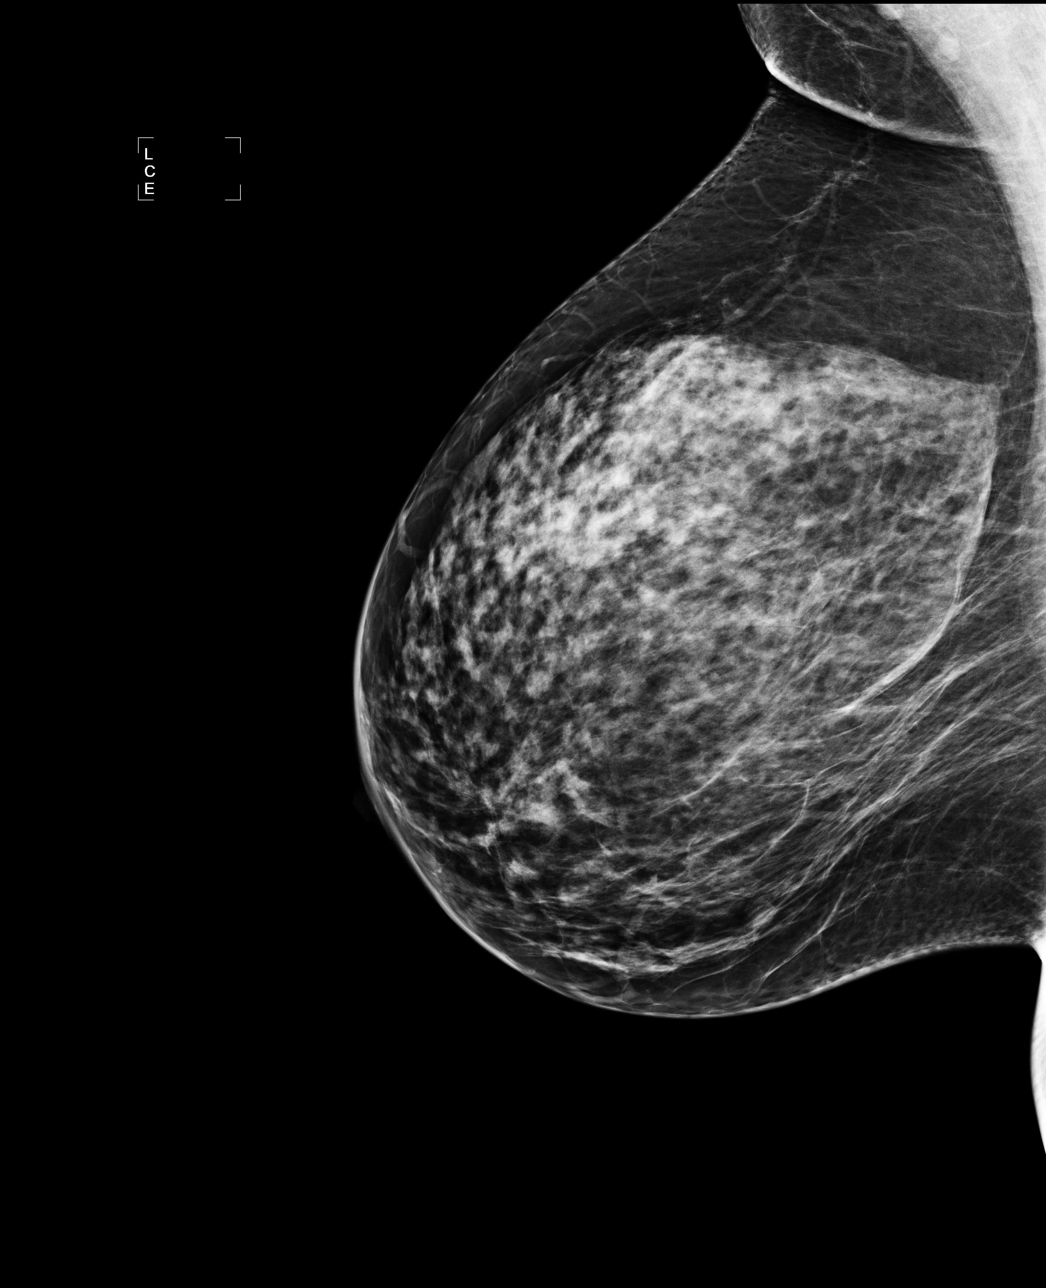

[4 of 4 positions shown; findings below may reference images not displayed]

The breast tissue is heterogeneously dense.  There is no dominant mass, architectural distortion or
calcification to suggest malignancy.

Images were processed with CAD.
IMPRESSION: No mammographic evidence of malignancy.  Suggest yearly screening mammography.

A result letter of this screening mammogram will be mailed directly to the patient.

ASSESSMENT: Negative - BI-RADS 1

Screening mammogram in 1 year.
,

## 2011-08-15 LAB — HEMOGLOBIN AND HEMATOCRIT, BLOOD: Hemoglobin: 17.5 — ABNORMAL HIGH

## 2011-08-17 ENCOUNTER — Other Ambulatory Visit: Payer: Self-pay | Admitting: Oncology

## 2011-08-17 ENCOUNTER — Ambulatory Visit (HOSPITAL_COMMUNITY): Payer: BC Managed Care – PPO | Attending: Oncology

## 2011-08-17 DIAGNOSIS — D45 Polycythemia vera: Secondary | ICD-10-CM | POA: Insufficient documentation

## 2011-08-17 LAB — HEMOGLOBIN AND HEMATOCRIT, BLOOD
HCT: 50.5 % — ABNORMAL HIGH (ref 36.0–46.0)
Hemoglobin: 16.8 g/dL — ABNORMAL HIGH (ref 12.0–15.0)
Hemoglobin: 14.9 g/dL (ref 12.0–15.0)

## 2011-08-20 LAB — HEMOGLOBIN AND HEMATOCRIT, BLOOD: Hemoglobin: 16.9 — ABNORMAL HIGH

## 2011-08-23 LAB — HEMOGLOBIN AND HEMATOCRIT, BLOOD: HCT: 48.7 — ABNORMAL HIGH

## 2011-11-13 DIAGNOSIS — D45 Polycythemia vera: Secondary | ICD-10-CM

## 2011-11-13 HISTORY — DX: Polycythemia vera: D45

## 2011-12-07 ENCOUNTER — Other Ambulatory Visit: Payer: Self-pay | Admitting: Oncology

## 2011-12-07 ENCOUNTER — Encounter: Payer: Self-pay | Admitting: Oncology

## 2011-12-07 ENCOUNTER — Inpatient Hospital Stay (HOSPITAL_COMMUNITY): Admission: RE | Admit: 2011-12-07 | Payer: BC Managed Care – PPO | Source: Ambulatory Visit

## 2011-12-07 DIAGNOSIS — D45 Polycythemia vera: Secondary | ICD-10-CM

## 2011-12-10 ENCOUNTER — Encounter (HOSPITAL_COMMUNITY)
Admission: RE | Admit: 2011-12-10 | Discharge: 2011-12-10 | Disposition: A | Discharge status: Home / Self Care | Payer: BC Managed Care – PPO | Source: Ambulatory Visit | Attending: Oncology | Admitting: Oncology

## 2011-12-10 DIAGNOSIS — D45 Polycythemia vera: Secondary | ICD-10-CM | POA: Insufficient documentation

## 2011-12-10 LAB — HEMOGLOBIN AND HEMATOCRIT, BLOOD
HCT: 41.4 % (ref 36.0–46.0)
Hemoglobin: 13.7 g/dL (ref 12.0–15.0)

## 2011-12-10 NOTE — Progress Notes (Signed)
Written orders for H/H & Phlebotomy ( ) for Hbg >or= 12 faxed to Short Stay.  dph

## 2011-12-10 NOTE — Progress Notes (Signed)
Patient felt heart at little fluttery after phlebotomy was done, when ambulating to bathroom BP 121/89 pulse 81 O2 sat 100 % room air Resp 20. Patient given coke and graham crackers discharged via wheel chair .

## 2011-12-19 ENCOUNTER — Other Ambulatory Visit: Payer: Self-pay | Admitting: Specialist

## 2011-12-19 DIAGNOSIS — IMO0002 Reserved for concepts with insufficient information to code with codable children: Secondary | ICD-10-CM

## 2011-12-24 ENCOUNTER — Other Ambulatory Visit: Payer: BC Managed Care – PPO

## 2011-12-31 ENCOUNTER — Inpatient Hospital Stay: Admission: RE | Admit: 2011-12-31 | Payer: BC Managed Care – PPO | Source: Ambulatory Visit

## 2012-03-10 ENCOUNTER — Inpatient Hospital Stay (HOSPITAL_COMMUNITY)
Admission: RE | Admit: 2012-03-10 | Discharge: 2012-03-15 | DRG: 430 | Disposition: A | Discharge status: Home / Self Care | Payer: BC Managed Care – PPO | Attending: Psychiatry | Admitting: Psychiatry

## 2012-03-10 ENCOUNTER — Emergency Department (HOSPITAL_COMMUNITY): Payer: BC Managed Care – PPO

## 2012-03-10 ENCOUNTER — Telehealth (HOSPITAL_COMMUNITY): Payer: Self-pay

## 2012-03-10 ENCOUNTER — Emergency Department (HOSPITAL_COMMUNITY)
Admission: EM | Admit: 2012-03-10 | Discharge: 2012-03-10 | Disposition: A | Discharge status: Other Acute Inpatient Hospital | Payer: BC Managed Care – PPO | Attending: Emergency Medicine | Admitting: Emergency Medicine

## 2012-03-10 ENCOUNTER — Encounter (HOSPITAL_COMMUNITY): Payer: Self-pay | Admitting: *Deleted

## 2012-03-10 DIAGNOSIS — F329 Major depressive disorder, single episode, unspecified: Secondary | ICD-10-CM | POA: Diagnosis present

## 2012-03-10 DIAGNOSIS — F1920 Other psychoactive substance dependence, uncomplicated: Secondary | ICD-10-CM | POA: Diagnosis present

## 2012-03-10 DIAGNOSIS — D45 Polycythemia vera: Secondary | ICD-10-CM | POA: Diagnosis present

## 2012-03-10 DIAGNOSIS — IMO0001 Reserved for inherently not codable concepts without codable children: Secondary | ICD-10-CM | POA: Diagnosis present

## 2012-03-10 DIAGNOSIS — K5900 Constipation, unspecified: Secondary | ICD-10-CM | POA: Diagnosis present

## 2012-03-10 DIAGNOSIS — F341 Dysthymic disorder: Secondary | ICD-10-CM | POA: Insufficient documentation

## 2012-03-10 DIAGNOSIS — F1320 Sedative, hypnotic or anxiolytic dependence, uncomplicated: Secondary | ICD-10-CM | POA: Diagnosis present

## 2012-03-10 DIAGNOSIS — F4310 Post-traumatic stress disorder, unspecified: Secondary | ICD-10-CM | POA: Diagnosis present

## 2012-03-10 DIAGNOSIS — F411 Generalized anxiety disorder: Secondary | ICD-10-CM | POA: Diagnosis present

## 2012-03-10 DIAGNOSIS — F1910 Other psychoactive substance abuse, uncomplicated: Secondary | ICD-10-CM | POA: Insufficient documentation

## 2012-03-10 DIAGNOSIS — Z79899 Other long term (current) drug therapy: Secondary | ICD-10-CM | POA: Insufficient documentation

## 2012-03-10 DIAGNOSIS — M5030 Other cervical disc degeneration, unspecified cervical region: Secondary | ICD-10-CM | POA: Diagnosis present

## 2012-03-10 DIAGNOSIS — F1120 Opioid dependence, uncomplicated: Secondary | ICD-10-CM | POA: Diagnosis present

## 2012-03-10 DIAGNOSIS — R1013 Epigastric pain: Secondary | ICD-10-CM

## 2012-03-10 DIAGNOSIS — Z7982 Long term (current) use of aspirin: Secondary | ICD-10-CM | POA: Insufficient documentation

## 2012-03-10 DIAGNOSIS — N318 Other neuromuscular dysfunction of bladder: Secondary | ICD-10-CM | POA: Diagnosis present

## 2012-03-10 HISTORY — DX: Post-traumatic stress disorder, unspecified: F43.10

## 2012-03-10 HISTORY — DX: Fibromyalgia: M79.7

## 2012-03-10 HISTORY — DX: Reserved for concepts with insufficient information to code with codable children: IMO0002

## 2012-03-10 HISTORY — DX: Depression, unspecified: F32.A

## 2012-03-10 HISTORY — DX: Bursopathy, unspecified: M71.9

## 2012-03-10 HISTORY — DX: Overactive bladder: N32.81

## 2012-03-10 HISTORY — DX: Anxiety disorder, unspecified: F41.9

## 2012-03-10 HISTORY — DX: Unspecified osteoarthritis, unspecified site: M19.90

## 2012-03-10 HISTORY — DX: Major depressive disorder, single episode, unspecified: F32.9

## 2012-03-10 LAB — COMPREHENSIVE METABOLIC PANEL
ALT: 8 U/L (ref 0–35)
AST: 11 U/L (ref 0–37)
Albumin: 3.6 g/dL (ref 3.5–5.2)
Alkaline Phosphatase: 61 U/L (ref 39–117)
Glucose, Bld: 108 mg/dL — ABNORMAL HIGH (ref 70–99)
Potassium: 3.8 mEq/L (ref 3.5–5.1)
Sodium: 139 mEq/L (ref 135–145)
Total Protein: 6.4 g/dL (ref 6.0–8.3)

## 2012-03-10 LAB — DIFFERENTIAL
Basophils Absolute: 0 10*3/uL (ref 0.0–0.1)
Basophils Relative: 0 % (ref 0–1)
Eosinophils Absolute: 0.2 10*3/uL (ref 0.0–0.7)
Lymphs Abs: 2.8 10*3/uL (ref 0.7–4.0)
Neutrophils Relative %: 57 % (ref 43–77)

## 2012-03-10 LAB — CBC
MCH: 29.1 pg (ref 26.0–34.0)
Platelets: 285 10*3/uL (ref 150–400)
RBC: 4.05 MIL/uL (ref 3.87–5.11)
RDW: 14.5 % (ref 11.5–15.5)

## 2012-03-10 LAB — RAPID URINE DRUG SCREEN, HOSP PERFORMED
Amphetamines: NOT DETECTED
Benzodiazepines: POSITIVE — AB
Tetrahydrocannabinol: NOT DETECTED

## 2012-03-10 LAB — ETHANOL: Alcohol, Ethyl (B): <11 mg/dL (ref 0–11)

## 2012-03-10 LAB — CARDIAC PANEL(CRET KIN+CKTOT+MB+TROPI): Total CK: 23 U/L (ref 7–177)

## 2012-03-10 IMAGING — CR DG CHEST 2V
2 series · 2 of 2 positions shown · non-contrast
Comparison: [DATE]

CLINICAL DATA: Chest pain

CHEST - 2 VIEW

[w chest pa]
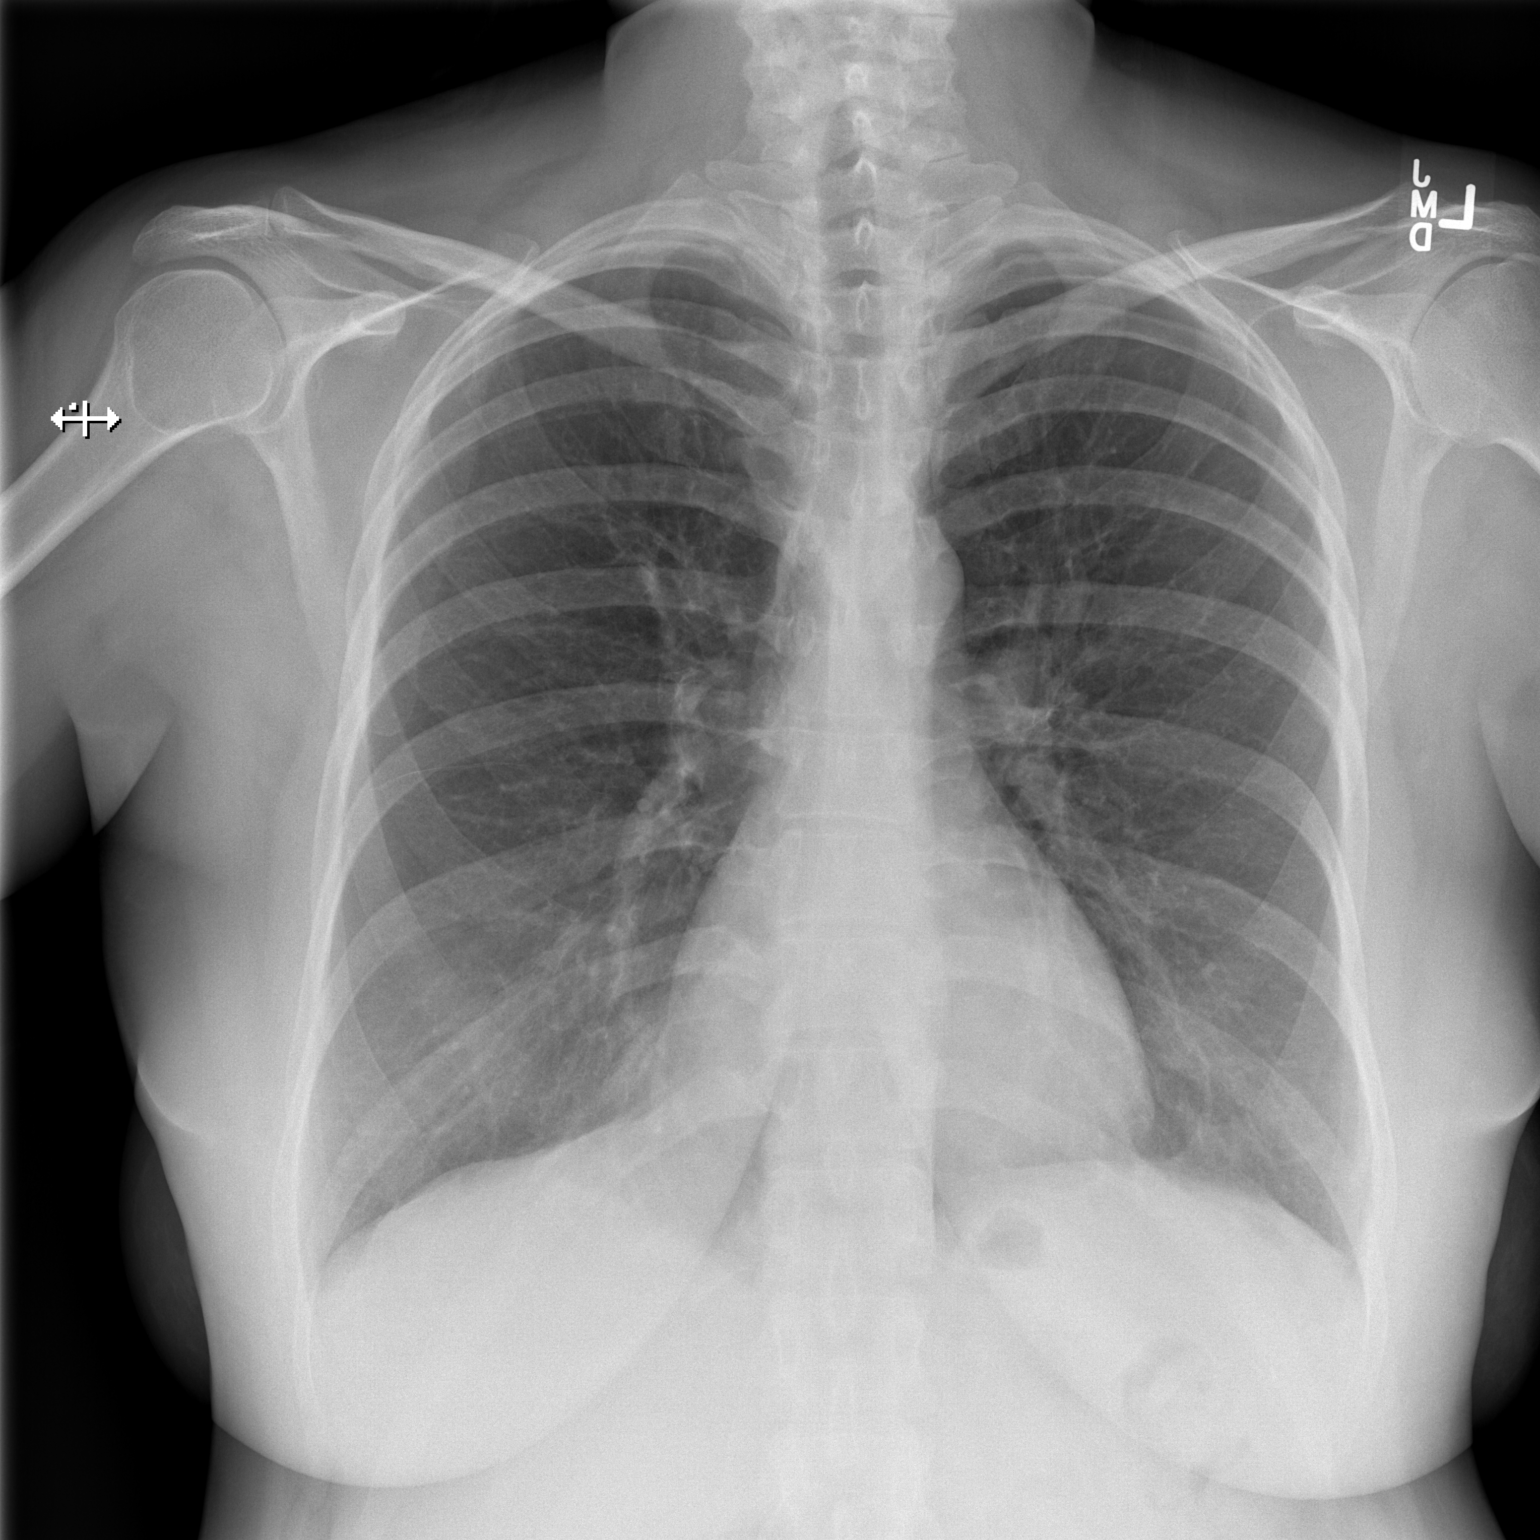

[w chest lat]
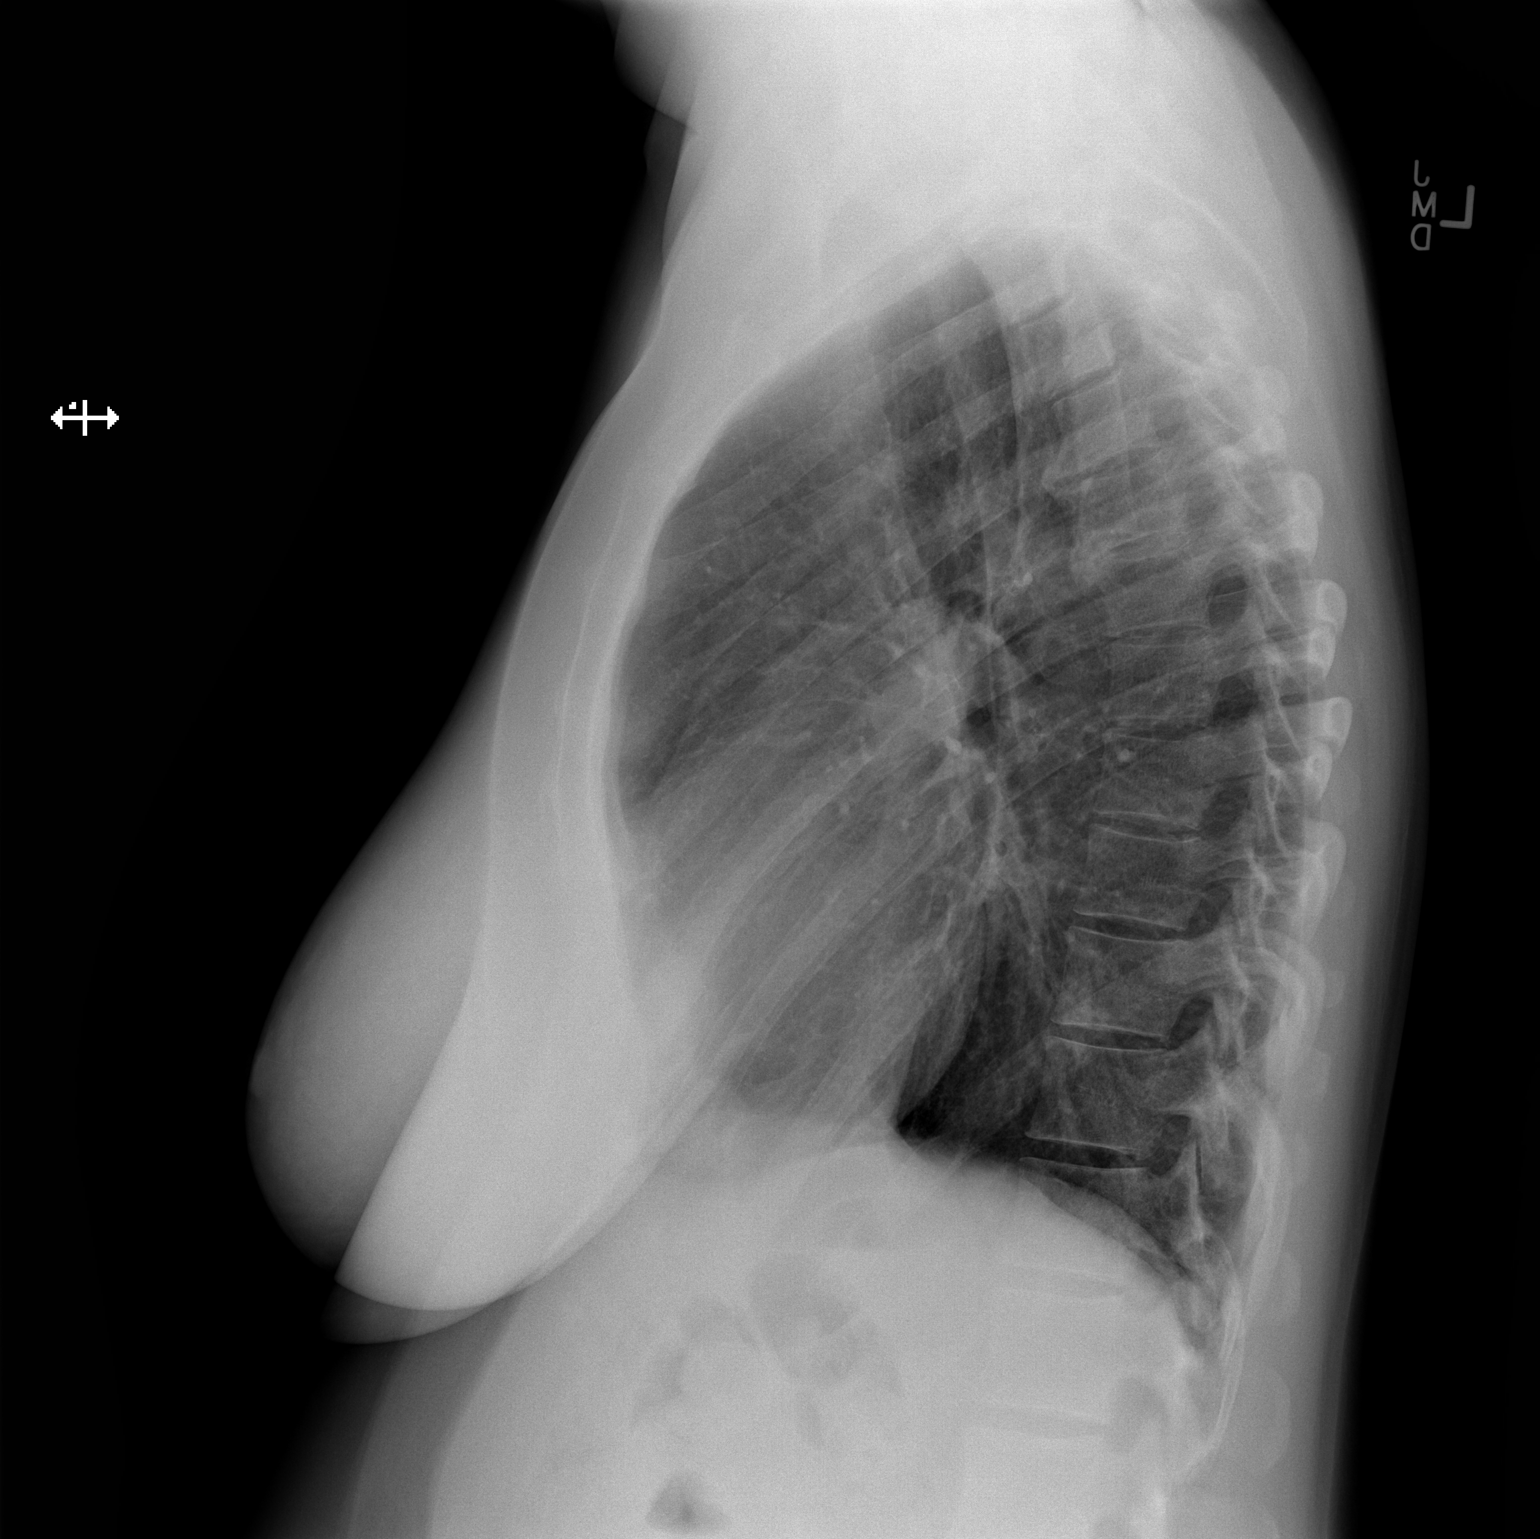

[2 of 2 positions shown; findings below may reference images not displayed]

FINDINGS: The lungs are clear without focal consolidation, edema,
effusion or pneumothorax.  Cardiopericardial silhouette is within
normal limits for size.  Imaged bony structures of the thorax are
intact.
IMPRESSION: Stable.  No acute findings.

## 2012-03-10 MED ORDER — ALUM & MAG HYDROXIDE-SIMETH 200-200-20 MG/5ML PO SUSP: 30.0000 mL | ORAL | Status: DC | PRN

## 2012-03-10 MED ORDER — PREGABALIN 50 MG PO CAPS
100.0000 mg | ORAL_CAPSULE | Freq: Two times a day (BID) | ORAL | Status: DC
  Administered 2012-03-10: 100 mg via ORAL
  Filled 2012-03-10: qty 2

## 2012-03-10 MED ORDER — PREGABALIN 50 MG PO CAPS
100.0000 mg | ORAL_CAPSULE | Freq: Three times a day (TID) | ORAL | Status: DC
  Administered 2012-03-11 – 2012-03-15 (×14): 100 mg via ORAL
  Filled 2012-03-10 (×10): qty 2
  Filled 2012-03-10: qty 24
  Filled 2012-03-10 (×4): qty 2

## 2012-03-10 MED ORDER — MAGNESIUM HYDROXIDE 400 MG/5ML PO SUSP
30.0000 mL | Freq: Every day | ORAL | Status: DC | PRN
  Administered 2012-03-12: 30 mL via ORAL

## 2012-03-10 MED ORDER — NAPROXEN 500 MG PO TABS
250.0000 mg | ORAL_TABLET | Freq: Two times a day (BID) | ORAL | Status: DC
  Administered 2012-03-10: 250 mg via ORAL
  Filled 2012-03-10: qty 1

## 2012-03-10 MED ORDER — DARIFENACIN HYDROBROMIDE ER 7.5 MG PO TB24
15.0000 mg | ORAL_TABLET | Freq: Every day | ORAL | Status: DC
  Administered 2012-03-11 – 2012-03-15 (×5): 15 mg via ORAL
  Filled 2012-03-10 (×3): qty 1
  Filled 2012-03-10: qty 14
  Filled 2012-03-10 (×3): qty 1

## 2012-03-10 MED ORDER — ALUM & MAG HYDROXIDE-SIMETH 200-200-20 MG/5ML PO SUSP
30.0000 mL | ORAL | Status: DC | PRN
Start: 2012-03-10 — End: 2012-03-10

## 2012-03-10 MED ORDER — HYDROXYZINE HCL 25 MG PO TABS
25.0000 mg | ORAL_TABLET | Freq: Every evening | ORAL | Status: DC | PRN
  Administered 2012-03-12: 25 mg via ORAL

## 2012-03-10 MED ORDER — CHLORDIAZEPOXIDE HCL 25 MG PO CAPS
25.0000 mg | ORAL_CAPSULE | ORAL | Status: DC | PRN
  Administered 2012-03-11: 25 mg via ORAL
  Filled 2012-03-10: qty 1

## 2012-03-10 MED ORDER — ACETAMINOPHEN 325 MG PO TABS: 650.0000 mg | ORAL_TABLET | Freq: Four times a day (QID) | ORAL | Status: DC | PRN

## 2012-03-10 MED ORDER — ZOLPIDEM TARTRATE 5 MG PO TABS
5.0000 mg | ORAL_TABLET | Freq: Every evening | ORAL | Status: DC | PRN
  Filled 2012-03-10: qty 1

## 2012-03-10 MED ORDER — ONDANSETRON HCL 4 MG PO TABS
4.0000 mg | ORAL_TABLET | Freq: Three times a day (TID) | ORAL | Status: DC | PRN
Start: 2012-03-10 — End: 2012-03-10

## 2012-03-10 MED ORDER — ACETAMINOPHEN 325 MG PO TABS
650.0000 mg | ORAL_TABLET | ORAL | Status: DC | PRN
Start: 2012-03-10 — End: 2012-03-10

## 2012-03-10 MED ORDER — ASPIRIN 81 MG PO CHEW
81.0000 mg | CHEWABLE_TABLET | Freq: Every day | ORAL | Status: DC
  Administered 2012-03-11 – 2012-03-15 (×5): 81 mg via ORAL
  Filled 2012-03-10 (×5): qty 1
  Filled 2012-03-10: qty 14
  Filled 2012-03-10 (×2): qty 1

## 2012-03-10 MED ORDER — ESCITALOPRAM OXALATE 10 MG PO TABS
20.0000 mg | ORAL_TABLET | Freq: Every day | ORAL | Status: DC
  Administered 2012-03-10: 20 mg via ORAL
  Filled 2012-03-10: qty 2

## 2012-03-10 MED ORDER — LORAZEPAM 1 MG PO TABS
1.0000 mg | ORAL_TABLET | Freq: Three times a day (TID) | ORAL | Status: DC | PRN
  Administered 2012-03-10: 1 mg via ORAL
  Filled 2012-03-10: qty 1

## 2012-03-10 MED ORDER — NICOTINE 21 MG/24HR TD PT24
21.0000 mg | MEDICATED_PATCH | Freq: Every day | TRANSDERMAL | Status: DC
  Filled 2012-03-10: qty 1

## 2012-03-10 NOTE — ED Notes (Signed)
Pt states she has fibromyalgia and has been taking too much of her pain medicine and is requesting detox. Denies SI/HI or hallucinations. Pt is A/O x4. Skin warm and dry. Respirations even and unlabored. NAD noted at this time.

## 2012-03-10 NOTE — BHH Counselor (Signed)
Pt has been accepted to [facility] for treatment, admitted to Dr. Koren Shiver, [phone]. Notified appropriate parties.

## 2012-03-10 NOTE — ED Provider Notes (Addendum)
History     CSN: [id number]  Arrival date & time 03/10/12  0909   First MD Initiated Contact with Patient 03/10/12 1003      Chief Complaint  Patient presents with  . Pain  . Medical Clearance    (Consider location/radiation/quality/duration/timing/severity/associated sxs/prior treatment) HPI Patient here with complaints of request to go to behavioral health due to xanax and percocet use.  She has been on these meds since 2011 when diagnosed with fibromyalgia and anxiety and depression.  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  States she realized problem due to sleeping all the time and not wanting to be around anyone.  Currently taking xanax 2 mg q four hours and hydrocodone 10 mg every four hours.  States she sometimes takes extra in between.  Patient states she has late nights when she feels overwhelmed but sister has overdosed three times and she would never do that to her parents.  Denies suicidality, or psychotic symptoms.   Past Medical History  Diagnosis Date  . Polycythemia vera 12/07/2011  . Fibromyalgia   . Polycythemia vera   . Bursitis     hips bilat  . Arthritis     back   . DDD (degenerative disc disease)   . Overactive bladder   . Depression   . PTSD (post-traumatic stress disorder)   . Anxiety     Past Surgical History  Procedure Date  . Abdominal hysterectomy   . Lumbar fusion     No family history on file.  History  Substance Use Topics  . Smoking status: Current Everyday Smoker -- 2.0 packs/day  . Smokeless tobacco: Not on file  . Alcohol Use: No    OB History    Grav Para Term Preterm Abortions TAB SAB Ect Mult Living                  Review of Systems  All other systems reviewed and are negative.    Allergies  Cyclobenzaprine; Diclofenac sodium; and Penicillins  Home Medications   Current Outpatient Rx  Name Route Sig Dispense Refill  . ALPRAZOLAM 2 MG PO TABS Oral  Take 2 mg by mouth every 4 (four) hours as needed. For anxiety.    . ASPIRIN 81 MG PO TABS Oral Take 81 mg by mouth daily.     Marland Kitchen VITAMIN D3 2000 UNITS PO TABS Oral Take 1 tablet by mouth daily.     Marland Kitchen ESCITALOPRAM OXALATE 20 MG PO TABS Oral Take 20 mg by mouth daily.    Marland Kitchen HYDROCODONE-ACETAMINOPHEN 10-325 MG PO TABS Oral Take 1 tablet by mouth every 4 (four) hours as needed. For pain.    . ADULT MULTIVITAMIN W/MINERALS CH Oral Take 1 tablet by mouth daily.    Marland Kitchen NAPROXEN SODIUM 220 MG PO TABS Oral Take 220 mg by mouth 2 (two) times daily as needed. For pain.    Marland Kitchen PREGABALIN 100 MG PO CAPS Oral Take 100 mg by mouth 3 (three) times daily.    Marland Kitchen SOLIFENACIN SUCCINATE 10 MG PO TABS Oral Take 10 mg by mouth daily.    Marland Kitchen VITAMIN B-12 1000 MCG PO TABS Oral Take 1,000 mcg by mouth daily.      BP 121/69  Pulse 81  Temp(Src) 97.7 F (36.5 C) (Oral)  Resp 17  Wt 168 lb (76.204 kg)  SpO2 98%  Physical Exam  Nursing note and vitals reviewed. Constitutional:  She appears well-developed and well-nourished.  HENT:  Head: Normocephalic and atraumatic.  Eyes: Conjunctivae and EOM are normal. Pupils are equal, round, and reactive to light.  Neck: Normal range of motion. Neck supple.  Cardiovascular: Normal rate, regular rhythm, normal heart sounds and intact distal pulses.   Pulmonary/Chest: Effort normal and breath sounds normal.  Abdominal: Soft. Bowel sounds are normal.  Musculoskeletal: Normal range of motion.  Neurological: She is alert.  Skin: Skin is warm and dry.  Psychiatric: She has a normal mood and affect. Thought content normal.    ED Course  Procedures (including critical care time)  Labs Reviewed  CBC - Abnormal; Notable for the following:    Hemoglobin 11.8 (*)    All other components within normal limits  DIFFERENTIAL  URINE RAPID DRUG SCREEN (HOSP PERFORMED)  COMPREHENSIVE METABOLIC PANEL  ETHANOL   No results found.   No diagnosis found.    MDM  Patient is medically  cleared and will be moved to the psychiatric emergency department for evaluation for admission for detox and further pain management       Hilario Quarry, MD 03/10/12 1207  Patient is signed out to Dr. Rubin Payor.     Hilario Quarry, MD 03/10/12 [phone]

## 2012-03-10 NOTE — ED Notes (Signed)
Pt ambulated to psych ed without assistance. Cane taken home by pt's mother.

## 2012-03-10 NOTE — ED Notes (Signed)
Pt. and belongings have both been wonded by security. 2 bags of belongings located in locker # 1 in triage

## 2012-03-10 NOTE — ED Notes (Signed)
Correction: 2 bags are in locker # 4 in triage.

## 2012-03-10 NOTE — BH Assessment (Addendum)
Assessment Note   Cheryl Cross is an 46 y.o. female. Pt presents with symptoms of fatigue, tearfulness, guilt associated with financial, emotional and health related stressors.  Pt. Reports she isolates self from people and does not like loud noises. Pt reports anxiety related symptoms when going out into public and increased paranoia in social situations.  Pt reports increased anxiety and worry, specifically at night.  Pt reports she is prescribed 325 mg of Hydrocodone and 2 mg of Xanax, prn.  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  Pt reports she "wants to get life together" and is motivated for change.  Pt denies any recent SI, but reports when she was first dx with health problems she did have SI thoughts, but no plan or intent.  Pt. Has no hx of A/V hallucinations, no hx of violence.  Pt. Lives alone, but has a supportive family.  Pt. Reports her sister has attempted suicide three times.  Pt. Denies any alcohol use.  Pt is requesting Detox from the benzo and opiate and seeks CDIOP once detox is completed.    Axis I: Major Depression, Recurrent severe and Substance Abuse Axis II:  Deferred Axis III: fybromialgia, polycythemia vera, overactive bladder, IBS bursitis, deg disc. Disease Axis IV:  Problems with finances, hx of health issues Axis V:  25  Past Medical History:  Past Medical History  Diagnosis Date  . Polycythemia vera 12/07/2011    No past surgical history on file.  Family History: No family history on file.  Social History:  does not have a smoking history on file. She does not have any smokeless tobacco history on file. Her alcohol and drug histories not on file.  Additional Social History:    Allergies:  Allergies  Allergen Reactions  . Diclofenac Sodium     REACTION: Bad itch  . Penicillins     Home Medications:  (Not in a hospital admission)  OB/GYN Status:  No  LMP recorded. Patient has had a hysterectomy.  General Assessment Data Location of Assessment: [facility] Living Arrangements: Alone Can pt return to current living arrangement?: Yes Admission Status: Voluntary Is patient capable of signing voluntary admission?: Yes Transfer from: Home Referral Source: Self/Family/Friend  Education Status Is patient currently in school?: No  Risk to self Suicidal Ideation: No Suicidal Intent: No Is patient at risk for suicide?: No Suicidal Plan?: No Access to Means: No What has been your use of drugs/alcohol within the last 12 months?: daily use of benzo opiates Previous Attempts/Gestures: No How many times?: 0  Other Self Harm Risks: none Triggers for Past Attempts: None known Intentional Self Injurious Behavior: None Family Suicide History: Yes Recent stressful life event(s): Job Loss;Loss (Comment);Financial Problems;Recent negative physical changes Persecutory voices/beliefs?: No Depression: Yes Depression Symptoms: Despondent;Tearfulness;Isolating;Fatigue;Guilt;Loss of interest in usual pleasures;Feeling worthless/self pity Substance abuse history and/or treatment for substance abuse?: No Suicide prevention information given to non-admitted patients: Not applicable  Risk to Others Homicidal Ideation: No Thoughts of Harm to Others: No Current Homicidal Intent: No Current Homicidal Plan: No Access to Homicidal Means: No History of harm to others?: No Assessment of Violence: None Noted Does patient have access to weapons?: No Criminal Charges Pending?: No Does patient have a court date: No  Psychosis Hallucinations: None noted Delusions: None noted  Mental Status Report Appear/Hygiene: Improved Eye Contact: Good Motor Activity: Freedom of movement Speech: Logical/coherent Level  of Consciousness: Alert Mood: Depressed Affect: Anxious;Appropriate to circumstance Anxiety Level: Moderate Thought Processes:  Coherent;Relevant Judgement: Unimpaired Orientation: Person;Place;Time;Situation Obsessive Compulsive Thoughts/Behaviors: None  Cognitive Functioning Concentration: Normal Memory: Recent Intact;Remote Intact Insight: Good Impulse Control: Fair Appetite: Fair Weight Loss: 0  Weight Gain: 0  Sleep: Increased Total Hours of Sleep: 15  Vegetative Symptoms: Staying in bed  Prior Inpatient Therapy Prior Inpatient Therapy: No  Prior Outpatient Therapy Prior Outpatient Therapy: Yes Prior Therapy Dates: 2012 Prior Therapy Facilty/Provider(s): [facility] Reason for Treatment: depression                     Additional Information 1:1 In Past 12 Months?: No CIRT Risk: No Elopement Risk: No Does patient have medical clearance?: No     Disposition: Pt recommended for Detox.  Need medical clearance.   Disposition Disposition of Patient: Inpatient treatment program Type of inpatient treatment program: Adult  On Site Evaluation by:   Reviewed with Physician:     Barbaraann Boys 03/10/2012 8:32 AM

## 2012-03-10 NOTE — ED Notes (Signed)
Pt. has two bags of belongings: 1 purse, 1 jacket, 1 pair of underwear, 1 pair of pants, 1 pair of shoes, 1 shirt.

## 2012-03-10 NOTE — ED Notes (Signed)
Family at bedside. Family wanded by security prior to entry. -- Pt. requests belongings go home with family.

## 2012-03-10 NOTE — ED Notes (Signed)
Pt. given blue scrubs and red socks. -- RN at bedside.

## 2012-03-10 NOTE — ED Notes (Signed)
Pt. escorted by [place] RN and mother to room with cane -- Belongings left with father.

## 2012-03-10 NOTE — ED Provider Notes (Signed)
  Physical Exam  BP 126/82  Pulse 57  Temp(Src) 97.9 F (36.6 C) (Oral)  Resp 16  Wt 168 lb (76.204 kg)  SpO2 98%  Physical Exam  ED Course  Procedures  MDM Patient has been accepted at the [facility]. She'll be transferred now.      Juliet Rude. Rubin Payor, MD 03/10/12 2229

## 2012-03-10 NOTE — ED Notes (Signed)
Pt states "I'm here today because I have chronic pain and take too much vicodin and xanax, I have fibromyalgia and bursitis, I can't do without my cane"

## 2012-03-10 NOTE — ED Notes (Signed)
Pt accepted to [facility] and transported via security with NT present. Verbalizes understanding of transfer. Denies SI/HI. Requesting detox for Vicodin and Xanax. Departs unit is safe and stable condition.

## 2012-03-11 LAB — TSH: TSH: 0.896 u[IU]/mL (ref 0.350–4.500)

## 2012-03-11 MED ORDER — CHLORDIAZEPOXIDE HCL 25 MG PO CAPS
25.0000 mg | ORAL_CAPSULE | ORAL | Status: AC
  Administered 2012-03-14 (×2): 25 mg via ORAL
  Filled 2012-03-11: qty 1

## 2012-03-11 MED ORDER — NAPROXEN 500 MG PO TABS
500.0000 mg | ORAL_TABLET | Freq: Two times a day (BID) | ORAL | Status: DC
  Administered 2012-03-11 – 2012-03-12 (×3): 500 mg via ORAL
  Filled 2012-03-11 (×5): qty 1

## 2012-03-11 MED ORDER — CLONIDINE HCL 0.1 MG PO TABS
0.1000 mg | ORAL_TABLET | ORAL | Status: AC
  Administered 2012-03-13 – 2012-03-14 (×3): 0.1 mg via ORAL
  Filled 2012-03-11 (×4): qty 1

## 2012-03-11 MED ORDER — CHLORDIAZEPOXIDE HCL 25 MG PO CAPS
25.0000 mg | ORAL_CAPSULE | Freq: Every day | ORAL | Status: AC
  Administered 2012-03-15: 25 mg via ORAL
  Filled 2012-03-11 (×2): qty 1

## 2012-03-11 MED ORDER — CITALOPRAM HYDROBROMIDE 20 MG PO TABS
20.0000 mg | ORAL_TABLET | Freq: Every day | ORAL | Status: DC
  Administered 2012-03-11 – 2012-03-15 (×5): 20 mg via ORAL
  Filled 2012-03-11: qty 1
  Filled 2012-03-11: qty 14
  Filled 2012-03-11 (×5): qty 1

## 2012-03-11 MED ORDER — CLONIDINE HCL 0.1 MG PO TABS
0.1000 mg | ORAL_TABLET | Freq: Four times a day (QID) | ORAL | Status: AC
  Administered 2012-03-11 – 2012-03-12 (×3): 0.1 mg via ORAL
  Filled 2012-03-11 (×6): qty 1

## 2012-03-11 MED ORDER — METHOCARBAMOL 500 MG PO TABS
500.0000 mg | ORAL_TABLET | Freq: Three times a day (TID) | ORAL | Status: DC | PRN
  Administered 2012-03-11 – 2012-03-12 (×2): 500 mg via ORAL
  Filled 2012-03-11 (×2): qty 1

## 2012-03-11 MED ORDER — TRAZODONE HCL 50 MG PO TABS
50.0000 mg | ORAL_TABLET | Freq: Every day | ORAL | Status: DC
  Administered 2012-03-11 – 2012-03-14 (×4): 50 mg via ORAL
  Filled 2012-03-11 (×5): qty 1
  Filled 2012-03-11: qty 14

## 2012-03-11 MED ORDER — LOPERAMIDE HCL 2 MG PO CAPS: 2.0000 mg | ORAL_CAPSULE | ORAL | Status: DC | PRN

## 2012-03-11 MED ORDER — CLONIDINE HCL 0.1 MG PO TABS
0.1000 mg | ORAL_TABLET | Freq: Every day | ORAL | Status: DC
  Administered 2012-03-15: 0.1 mg via ORAL
  Filled 2012-03-11 (×2): qty 1

## 2012-03-11 MED ORDER — CHLORDIAZEPOXIDE HCL 25 MG PO CAPS
25.0000 mg | ORAL_CAPSULE | Freq: Four times a day (QID) | ORAL | Status: DC | PRN
  Administered 2012-03-13: 25 mg via ORAL
  Filled 2012-03-11: qty 1

## 2012-03-11 MED ORDER — VITAMIN B-12 1000 MCG PO TABS
1000.0000 ug | ORAL_TABLET | Freq: Every day | ORAL | Status: DC
  Administered 2012-03-11 – 2012-03-15 (×5): 1000 ug via ORAL
  Filled 2012-03-11 (×5): qty 1
  Filled 2012-03-11: qty 14
  Filled 2012-03-11 (×3): qty 1

## 2012-03-11 MED ORDER — ONDANSETRON 4 MG PO TBDP
4.0000 mg | ORAL_TABLET | Freq: Four times a day (QID) | ORAL | Status: AC | PRN
  Administered 2012-03-12: 4 mg via ORAL

## 2012-03-11 MED ORDER — CHLORDIAZEPOXIDE HCL 25 MG PO CAPS
25.0000 mg | ORAL_CAPSULE | Freq: Three times a day (TID) | ORAL | Status: AC
  Administered 2012-03-13 (×3): 25 mg via ORAL
  Filled 2012-03-11 (×5): qty 1

## 2012-03-11 MED ORDER — CHLORDIAZEPOXIDE HCL 25 MG PO CAPS
25.0000 mg | ORAL_CAPSULE | Freq: Four times a day (QID) | ORAL | Status: AC
  Administered 2012-03-11 – 2012-03-12 (×6): 25 mg via ORAL
  Filled 2012-03-11 (×4): qty 1

## 2012-03-11 MED ORDER — DICYCLOMINE HCL 20 MG PO TABS
20.0000 mg | ORAL_TABLET | ORAL | Status: DC | PRN
  Administered 2012-03-13: 20 mg via ORAL
  Filled 2012-03-11: qty 1

## 2012-03-11 MED ORDER — VITAMIN D3 25 MCG (1000 UNIT) PO TABS
2000.0000 [IU] | ORAL_TABLET | Freq: Every day | ORAL | Status: DC
  Administered 2012-03-11 – 2012-03-15 (×5): 2000 [IU] via ORAL
  Filled 2012-03-11: qty 28
  Filled 2012-03-11 (×6): qty 2

## 2012-03-11 NOTE — H&P (Signed)
Psychiatric Admission Assessment Adult  Patient Identification:  Chelby Salata Mccollister Date of Evaluation:  03/11/2012 Chief Complaint:  Requesting detox from benzo's and opiates History of Present Illness:: First behavioral health admission for Ellan who presents requesting help getting off the alprazolam if she is taking, which is typically 2 mg or 5 times daily. She also has been taking hydrocodone 10 mg every 4 hours as at times takes extras in between. She is frustrated with the amount of medications that she takes asking for help getting her depression under control and getting her life in order to resume teaching highschool for the fall 2013.  She reports that she is very frustrated, guilty, and ashamed, about being a burden on her parents since she has not been working regularly since 2011 when she was diagnosed with fibromyalgia. She has had problems with chronic pain since that time and underwent management [facility] hospitals.  For the past couple of months she has had excessive anxiety with catastrophizing and excess worry. She reports hypersomnia and can typically sleeps from 10 PM at night to 1 PM the next day. Her appetite has deteriorated, and her sleep continues to be non-restful. She endorses depressed mood, frequent crying spells, and more recently having problems with agoraphobia. She is afraid to go out of the house and gets very anxious and has been unable to shop or do any outside activities alone. She denies active suicidal thoughts feels she cannot go on living like this.  She cites her multiple medical problems as a burden.   Past psychiatric history:   She is currently followed by her primary care physician Dr. Regino Schultze MD at [facility]. She was prescribed Lexapro in 2004 in the setting of multiple family stressors, and is taking it consistently since that time. She is currently taking 20 mg daily, at other times she takes 30 mg daily when she can afford it. She was  prescribed Cymbalta at one point by [facility] pain management, but didn't like it and did not continue taking it.  She has taking gabapentin, 2400 mg daily for pain management in the past. She has taken Lyrica up to 600 mg daily for pain management and is currently taking her Lyrica 100 mg daily. She reports smoking marijuana in high school but denies any other substance abuse, other than some overuse of her current medications.  Mood Symptoms:  Depression, Sleep, Depression Symptoms:  depressed mood, anhedonia, hypersomnia, anxiety, (Hypo) Manic Symptoms:  None evident Anxiety Symptoms:  Catastrophizing, excess worry, hypervigilance Psychotic Symptoms:  None evident  Past Psychiatric History: Diagnosis:  Hospitalizations:  Outpatient Care:  Substance Abuse Care:  Self-Mutilation:  Suicidal Attempts:  Violent Behaviors:   Past Medical History:   Past Medical History  Diagnosis Date  . Polycythemia vera 12/07/2011  . Fibromyalgia   . Polycythemia vera   . Bursitis     hips bilat  . Arthritis     back   . DDD (degenerative disc disease)   . Overactive bladder   . Depression   . PTSD (post-traumatic stress disorder)   . Anxiety     Allergies:   Allergies  Allergen Reactions  . Cyclobenzaprine     Hot sensation  . Diclofenac Sodium     REACTION: Bad itch  . Penicillins    PTA Medications: Prescriptions prior to admission  Medication Sig Dispense Refill  . alprazolam (XANAX) 2 MG tablet Take 2 mg by mouth every 4 (four) hours as needed. For anxiety.      Marland Kitchen  aspirin 81 MG tablet Take 81 mg by mouth daily.       . Cholecalciferol (VITAMIN D3) 2000 UNITS TABS Take 1 tablet by mouth daily.       Marland Kitchen escitalopram (LEXAPRO) 20 MG tablet Take 20 mg by mouth daily.      Marland Kitchen HYDROcodone-acetaminophen (NORCO) 10-325 MG per tablet Take 1 tablet by mouth every 4 (four) hours as needed. For pain.      . Multiple Vitamin (MULITIVITAMIN WITH MINERALS) TABS Take 1 tablet by mouth daily.        . naproxen sodium (ANAPROX) 220 MG tablet Take 220 mg by mouth 2 (two) times daily as needed. For pain.      . pregabalin (LYRICA) 100 MG capsule Take 100 mg by mouth 3 (three) times daily.      . solifenacin (VESICARE) 10 MG tablet Take 10 mg by mouth daily.      . vitamin B-12 (CYANOCOBALAMIN) 1000 MCG tablet Take 1,000 mcg by mouth daily.        Previous Psychotropic Medications:  Medication/Dose                 Substance Abuse History in the last 12 months: Substance Age of 1st Use Last Use Amount Specific Type  Nicotine      Alcohol      Cannabis      Opiates      Cocaine      Methamphetamines      LSD      Ecstasy      Benzodiazepines      Caffeine      Inhalants      Others:                         Consequences of Substance Abuse: Social History: Current Place of Residence:  Divorced with no children. Currently living with her parents.  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  No legal problems.  Positive for financial stressors with loss of full time work.  Parents are concerned about her hypersomnia and poor sleep. Place of Birth:   Family Members: Marital Status:  Divorced Children:  Sons:  Daughters: Relationships: Education:  Corporate treasurer Problems/Performance: Religious Beliefs/Practices: History of Abuse (Emotional/Phsycial/Sexual) Teacher, music History:  None. Legal History: Hobbies/Interests:  Family History:  Sister with Bipolar Disorder and at least three previous suicide attempts.   Mental Status Examination/Evaluation: Objective:  Appearance: Casual  Eye Contact::  Good  Speech:  Clear and Coherent, articulate  Volume:  Normal  Mood:  Depressed  Affect:  Appropriate  Thought Process:  Goal Directed  Orientation:  Full  Thought Content:  No suicidal thoughts, appropriate concerns, appropriate questions about how to move forward, medications, etc.  Suicidal  Thoughts:  No  Homicidal Thoughts:  No  Memory:  Immediate;   Good Recent;   Good Remote;   Good  Judgement:  Fair  Insight:  Fair  Psychomotor Activity:  Normal  Concentration:  Good  Recall:  Good  Akathisia:  No  Handed:    AIMS (if indicated):   0  Assets:  Communication Skills Desire for Improvement Financial Resources/Insurance Housing Talents/Skills  Sleep:  Number of Hours: 5.25    Medical Evaluation:   Fully alert female, adequately nourished, adequately hydrated, in no acute physical distress. She is followed by Dr. Cyndie Chime for polycythemia vera. She has a history of lumbar fusion in 2010.  Current diagnostic studies revealed a normal comprehensive metabolic panel, urine drug screen positive for benzodiazepines and opiates, and CBC normal. She has a history of previous hysterectomy. TSH pending. I have medically and physically evaluated this patient and my findings are consistent with those of the emergency room.   Laboratory/X-Ray Psychological Evaluation(s)      Assessment:    AXIS I:  Benzodiazepine Dependence; Opiate Dependence; Depressive disorder NOS AXIS II:  No Diagnosis AXIS III:  Fibromyalgia; Polycythemia Vera; Cervical Disc Disease; Bursitis Bilateral Hips; Overactive Bladder. Past Medical History  Diagnosis Date  . Polycythemia vera 12/07/2011  . Fibromyalgia   . Polycythemia vera   . Bursitis     hips bilat  . Arthritis     back   . DDD (degenerative disc disease)   . Overactive bladder   . Depression   . PTSD (post-traumatic stress disorder)   . Anxiety    AXIS IV:  Financial Stressors; Multiple Medical Problems AXIS V:  40  Treatment Plan Summary: Will start Celexa for depression, and will detox with clonidine and librium protocols.    Current Medications:  Current Facility-Administered Medications  Medication Dose Route Frequency Provider Last Rate Last Dose  . alum & mag hydroxide-simeth (MAALOX/MYLANTA) 200-200-20 MG/5ML  suspension 30 mL  30 mL Oral Q4H PRN Viviann Spare, NP      . aspirin chewable tablet 81 mg  81 mg Oral Daily Viviann Spare, NP   81 mg at 03/11/12 0902  . chlordiazePOXIDE (LIBRIUM) capsule 25 mg  25 mg Oral QID Alyson Kuroski-Mazzei, DO       Followed by  . chlordiazePOXIDE (LIBRIUM) capsule 25 mg  25 mg Oral TID Alyson Kuroski-Mazzei, DO       Followed by  . chlordiazePOXIDE (LIBRIUM) capsule 25 mg  25 mg Oral BH-qamhs Alyson Kuroski-Mazzei, DO       Followed by  . chlordiazePOXIDE (LIBRIUM) capsule 25 mg  25 mg Oral Daily Alyson Kuroski-Mazzei, DO      . cholecalciferol (VITAMIN D) tablet 2,000 Units  2,000 Units Oral Daily Viviann Spare, NP   2,000 Units at 03/11/12 1206  . citalopram (CELEXA) tablet 20 mg  20 mg Oral Daily Alyson Kuroski-Mazzei, DO   20 mg at 03/11/12 1442  . cloNIDine (CATAPRES) tablet 0.1 mg  0.1 mg Oral QID Alyson Kuroski-Mazzei, DO       Followed by  . cloNIDine (CATAPRES) tablet 0.1 mg  0.1 mg Oral BH-qamhs Alyson Kuroski-Mazzei, DO       Followed by  . cloNIDine (CATAPRES) tablet 0.1 mg  0.1 mg Oral QAC breakfast Alyson Kuroski-Mazzei, DO      . darifenacin (ENABLEX) 24 hr tablet 15 mg  15 mg Oral Daily Viviann Spare, NP   15 mg at 03/11/12 0903  . dicyclomine (BENTYL) tablet 20 mg  20 mg Oral Q4H PRN Alyson Kuroski-Mazzei, DO      . hydrOXYzine (ATARAX/VISTARIL) tablet 25 mg  25 mg Oral QHS PRN Viviann Spare, NP      . loperamide (IMODIUM) capsule 2-4 mg  2-4 mg Oral PRN Alyson Kuroski-Mazzei, DO      . magnesium hydroxide (MILK OF MAGNESIA) suspension 30 mL  30 mL Oral Daily PRN Viviann Spare, NP      . methocarbamol (ROBAXIN) tablet 500 mg  500 mg Oral Q8H PRN Alyson Kuroski-Mazzei, DO      . naproxen (NAPROSYN) tablet 500 mg  500 mg Oral BID WC Jessicia Napolitano A  Iley Deignan, NP   500 mg at 03/11/12 1045  . ondansetron (ZOFRAN-ODT) disintegrating tablet 4 mg  4 mg Oral Q6H PRN Alyson Kuroski-Mazzei, DO      . pregabalin (LYRICA) capsule 100 mg  100 mg Oral  TID Viviann Spare, NP   100 mg at 03/11/12 1205  . traZODone (DESYREL) tablet 50 mg  50 mg Oral QHS Alyson Kuroski-Mazzei, DO      . vitamin B-12 (CYANOCOBALAMIN) tablet 1,000 mcg  1,000 mcg Oral Daily Viviann Spare, NP   1,000 mcg at 03/11/12 1205  . DISCONTD: acetaminophen (TYLENOL) tablet 650 mg  650 mg Oral Q6H PRN Viviann Spare, NP      . DISCONTD: chlordiazePOXIDE (LIBRIUM) capsule 25 mg  25 mg Oral Q4H PRN Viviann Spare, NP   25 mg at 03/11/12 0645   Facility-Administered Medications Ordered in Other Encounters  Medication Dose Route Frequency Provider Last Rate Last Dose  . DISCONTD: acetaminophen (TYLENOL) tablet 650 mg  650 mg Oral Q4H PRN Hilario Quarry, MD      . DISCONTD: alum & mag hydroxide-simeth (MAALOX/MYLANTA) 200-200-20 MG/5ML suspension 30 mL  30 mL Oral PRN Hilario Quarry, MD      . DISCONTD: escitalopram (LEXAPRO) tablet 20 mg  20 mg Oral Daily Hilario Quarry, MD   20 mg at 03/10/12 1748  . DISCONTD: LORazepam (ATIVAN) tablet 1 mg  1 mg Oral Q8H PRN Hilario Quarry, MD   1 mg at 03/10/12 2211  . DISCONTD: naproxen (NAPROSYN) tablet 250 mg  250 mg Oral BID WC Hilario Quarry, MD   250 mg at 03/10/12 1754  . DISCONTD: nicotine (NICODERM CQ - dosed in mg/24 hours) patch 21 mg  21 mg Transdermal Daily Hilario Quarry, MD      . DISCONTD: ondansetron (ZOFRAN) tablet 4 mg  4 mg Oral Q8H PRN Hilario Quarry, MD      . DISCONTD: pregabalin (LYRICA) capsule 100 mg  100 mg Oral BID Hilario Quarry, MD   100 mg at 03/10/12 2211  . DISCONTD: zolpidem (AMBIEN) tablet 5 mg  5 mg Oral QHS PRN Hilario Quarry, MD        Observation Level/Precautions:    Laboratory:    Psychotherapy:    Medications:    Routine PRN Medications:    Consultations:    Discharge Concerns:    Other:     Jacari Iannello A 4/30/20133:18 PM

## 2012-03-11 NOTE — Progress Notes (Signed)
Patient visiting with mother and niece this evening. Appears depressed. Spoke some with Clinical research associate about her depression. She reports being very active in the past but her fibromyalgia has slowed her down. 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 Patient is talkative and engages easily when prompted to share. Remains safe on the unit.

## 2012-03-11 NOTE — Tx Team (Signed)
Initial Interdisciplinary Treatment Plan  PATIENT STRENGTHS: (choose at least two) Ability for insight Average or above average intelligence Capable of independent living Communication skills General fund of knowledge Motivation for treatment/growth Supportive family/friends Work skills  PATIENT STRESSORS: Health problems Occupational concerns Substance abuse   PROBLEM LIST: Problem List/Patient Goals Date to be addressed Date deferred Reason deferred Estimated date of resolution  depression      Abusing her prescription of xanax and hydrocodone      Financial stress-can't work d/t her chronic pain                                           DISCHARGE CRITERIA:  Improved stabilization in mood, thinking, and/or behavior Motivation to continue treatment in a less acute level of care Verbal commitment to aftercare and medication compliance Withdrawal symptoms are absent or subacute and managed without 24-hour nursing intervention  PRELIMINARY DISCHARGE PLAN: Attend aftercare/continuing care group Outpatient therapy Return to previous living arrangement  PATIENT/FAMIILY INVOLVEMENT: This treatment plan has been presented to and reviewed with the patient, Cheryl Cross, and/or family member.  The patient and family have been given the opportunity to ask questions and make suggestions.  Jesus Genera [facility] 03/11/2012, 12:11 AM

## 2012-03-11 NOTE — Progress Notes (Signed)
Pt observed in her room in bed awake.  She reports she had a bad day.  After getting a Librium 25mg  at 0645, she did not receive any more meds until this afternoon.  She reports she was very anxious, and spent most of the day in her room in bed.  She said she felt dehydrated and shaky.  Pt was given a pitcher of gatorade.  We also reviewed her HS meds for tonight.  She requests Naproxen at HS.  Pt denies SI/HI.  Safety maintained with q15 minute checks.

## 2012-03-11 NOTE — [facility] Suicide Risk Assessment (Signed)
Suicide Risk Assessment  Admission Assessment      Demographic factors:  See chart.  Current Mental Status:  Patient seen and evaluated. Chart reviewed. Patient stated that her mood was "not good". Her affect was mood congruent and anxious. She denied any current thoughts of self injurious behavior, suicidal ideation or homicidal ideation. There were no auditory or visual hallucinations, paranoia, delusional thought processes, or mania noted.  Thought process was linear and goal directed.  No psychomotor agitation or retardation was noted. Speech was normal rate, tone and volume. Eye contact was good. Judgment and insight are fair.  Patient has been up and engaged on the unit.  No acute safety concerns reported from team.    Loss Factors: Decline in physical health;Financial problems / change in socioeconomic status  Historical Factors:  Depressed with hypersomnia; poor appetite; agoraphobia; sister with BPAD; parents ill; on Lexapro in past w/o current reported efficacy  Risk Reduction Factors: Sense of responsibility to family;Positive coping skills or problem solving skills;Positive social support; open to Tx options  CLINICAL FACTORS: Opioid and Benzodiazepine W/D; Depressive Disorder NOS; Fibromyalgia, Chronic Pain; Insomnia  COGNITIVE FEATURES THAT CONTRIBUTE TO RISK: limited insight, impulsivity.  SUICIDE RISK: Pt viewed as a chronic moderate increased risk of harm to self in light of her past hx and risk factors.  No acute safety concerns on the unit.  Pt contracting for safety and in need of crisis stabilization, detox & Tx.  PLAN OF CARE: Pt admitted for crisis stabilization and treatment.  Willing to detox off opiates and benzos.  Discussed options for non addictive pain control.  Started citalopram for reported depressive and anxiety s/s.  Please see orders.  Medications reviewed with pt and medication education provided. Will continue q15 minute checks per unit protocol.  No  clinical indication for one on one level of observation at this time.  Pt contracting for safety.  Mental health treatment, medication management and continued sobriety will mitigate against the increased risk of harm to self and/or others.  Discussed the importance of recovery with pt, as well as, tools to move forward in a healthy & safe manner.  Pt agreeable with the plan.  Discussed with the team.    Lupe Carney 03/11/2012, 1:54 PM

## 2012-03-11 NOTE — Progress Notes (Signed)
[facility] Group Notes:  (Counselor/Nursing/MHT/Case Management/Adjunct)  03/11/2012 12:51 PM  Type of Therapy:  Group Therapy at 11:00  Participation Level:  Did Not Attend  Clide Dales 03/11/2012, 12:51 PM

## 2012-03-11 NOTE — Progress Notes (Signed)
Vol admit to the 300 hall requesting detox from Xanax and help with controlling pain as she has been overusing her hydrocodone.  Pt has had to stop working because of the pain she experiences.  She reports she has been through pain management at [facility] and still cannot get the relief she needs.  She says she uses a cane at home.  She denies SI/HI/AV.  Medical issues include:  Fibromyalgia, bursitis, DDD, chronic pain, arthritis, PTSD, overactive bladder, depression.  She states she is tired of living this way and wants help.  Pt was pleasant/cooperative with the admission process.

## 2012-03-11 NOTE — Discharge Planning (Signed)
Cheryl Cross attended AM group.  Stated she is here fro detox from benzos and opiates which she has been prescribed to address fibromyalgia issues.  Says she had a bad experience coming off of Gabapentin in past, and that's why she came for assistance with this.  Lives alone.  Separated from husband.  Supportive family.  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

## 2012-03-12 DIAGNOSIS — F329 Major depressive disorder, single episode, unspecified: Principal | ICD-10-CM

## 2012-03-12 DIAGNOSIS — F1320 Sedative, hypnotic or anxiolytic dependence, uncomplicated: Secondary | ICD-10-CM

## 2012-03-12 MED ORDER — LIDOCAINE 5 % EX PTCH
1.0000 | MEDICATED_PATCH | CUTANEOUS | Status: DC
  Administered 2012-03-12 – 2012-03-15 (×4): 1 via TRANSDERMAL
  Filled 2012-03-12: qty 7
  Filled 2012-03-12 (×5): qty 1

## 2012-03-12 MED ORDER — METHOCARBAMOL 500 MG PO TABS
500.0000 mg | ORAL_TABLET | Freq: Three times a day (TID) | ORAL | Status: DC
  Administered 2012-03-12 – 2012-03-15 (×12): 500 mg via ORAL
  Filled 2012-03-12: qty 28
  Filled 2012-03-12 (×7): qty 1
  Filled 2012-03-12: qty 28
  Filled 2012-03-12 (×2): qty 1
  Filled 2012-03-12: qty 28
  Filled 2012-03-12 (×2): qty 1
  Filled 2012-03-12: qty 28
  Filled 2012-03-12 (×5): qty 1

## 2012-03-12 MED ORDER — GABAPENTIN 300 MG PO CAPS
300.0000 mg | ORAL_CAPSULE | Freq: Three times a day (TID) | ORAL | Status: DC
  Administered 2012-03-12 – 2012-03-15 (×10): 300 mg via ORAL
  Filled 2012-03-12 (×7): qty 1
  Filled 2012-03-12: qty 42
  Filled 2012-03-12 (×3): qty 1
  Filled 2012-03-12: qty 42
  Filled 2012-03-12: qty 1
  Filled 2012-03-12: qty 42
  Filled 2012-03-12 (×3): qty 1

## 2012-03-12 MED ORDER — NAPROXEN 500 MG PO TABS
500.0000 mg | ORAL_TABLET | Freq: Two times a day (BID) | ORAL | Status: DC
  Administered 2012-03-12 – 2012-03-15 (×6): 500 mg via ORAL
  Filled 2012-03-12: qty 1
  Filled 2012-03-12: qty 28
  Filled 2012-03-12 (×9): qty 1
  Filled 2012-03-12: qty 28
  Filled 2012-03-12: qty 1

## 2012-03-12 NOTE — Progress Notes (Signed)
Patient ID: Cheryl Cross, female   DOB: 25-Jun-1966, 46 y.o.   MRN: [id number] She has been up and to groups interacting with peers and staff. This AM she c/o back and neck pain , see new orders. Has not requested any pain medication thus far this afternoon.  Self Inventory: depression  5, hopelessness 5, denies SI thoughts.

## 2012-03-12 NOTE — H&P (Signed)
Pt seen and evaluated upon admission.  Completed Admission Suicide Risk Assessment.  See orders.  Pt agreeable with plan.  Discussed with team.   

## 2012-03-12 NOTE — Discharge Planning (Signed)
Cheryl Cross attended AM group.  Focused on pain, but continues determined on getting off of opiates and benzos.

## 2012-03-12 NOTE — Progress Notes (Signed)
[facility] Group Notes:  (Counselor/Nursing/MHT/Case Management/Adjunct)  03/12/2012 3:15 PM   Type of Therapy:  Processing Group at 11:00 am  Participation Level:  Active  Participation Quality:  Attentive and sharing  Affect: Appropriate  Cognitive:  Oriented  Insight:  Limited  Engagement in Group:  Good  Engagement in Therapy:  Limited  Modes of Intervention: Exploration and Support  Summary of Progress/Problems:  Cheryl Cross was asked to share with group what brought her into hospital which she willingly did describing her medication usage "to point people would be banging on my door to check on me and I wouldn't even hear them or if I awoke I wouldn't want to talk to them."  Patient was attentive to group discussion on patterns of behavior that can keep [place] stuck  [facility] Group Notes:  (Counselor/Nursing/MHT/Case Management/Adjunct)  03/12/2012    Type of Therapy:  Counseling Group at 1:15 pm  Participation Level:  Minimal  Participation Quality:  Sharing  Affect:  depressed  Cognitive:  Oriented  Insight:  Limited  Engagement in Group:  Limited  Engagement in Therapy:  None noted  Modes of Intervention:  Limit setting and support  Summary of Progress/Problems:  Patient only attended 15 minutes of 45 min group session in which she talked about others (my students) than herself.  Patient was responsive to limit setting.   Ronda Fairly, LCSWA 03/12/2012 3:25 PM

## 2012-03-12 NOTE — Treatment Plan (Signed)
Interdisciplinary Treatment Plan Update (Adult)  Date: 03/12/2012  Time Reviewed: 8:25 AM   Progress in Treatment: Attending groups: Yes Participating in groups: Yes Taking medication as prescribed: Yes Tolerating medication: Yes   Family/Significant othe contact made:   Patient understands diagnosis:  Yes Discussing patient identified problems/goals with staff:  Yes Medical problems stabilized or resolved:  Yes Denies suicidal/homicidal ideation: Yes Issues/concerns per patient self-inventory:  Yes Depression 5, hopelessness 7, C/O withdrawal symptoms Other:  New problem(s) identified: N/A  Reason for Continuation of Hospitalization: Depression Medication stabilization Withdrawal symptoms  Interventions implemented related to continuation of hospitalization:  Adjust meds for pain.  Make PRN's scheduled  Start Neurontin   Encourage group attendance and participation  Additional comments:  Estimated length of stay: 3-4 days  Discharge Plan: Return home  Follow up outpt  New goal(s): N/A  Review of initial/current patient goals per problem list:   1.  Goal(s): Safely detox from benzos and opiates  Met:  No  Target date:5/3  As evidenced by:Stable vitals, CIWA and COWS score of 0  2.  Goal (s): Address pain through the use of medications  Met:  No  Target date:5/3  As evidenced by: Lawson Fiscal will report a decrease in pain  3.  Goal(s): Decrease depression  Met:  No  Target date: 5/3  As evidenced by: Self inventory score of 4 or less  4.  Goal(s):   Met:  No  Target date:  As evidenced by:  Attendees: Patient: <Cheryl Cross  03/12/2012 8:25 AM  Family:     Physician:  Lupe Carney 03/12/2012 8:25 AM   Nursing:   Robbie Louis 03/12/2012 8:25 AM   Case Manager:  Richelle Ito, LCSW 03/12/2012 8:25 AM   Counselor:  Ronda Fairly, LCSWA 03/12/2012 8:25 AM   Other:     Other:     Other:     Other:      Scribe for Treatment Team:   Daryel Gerald B,  03/12/2012 8:25 AM
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
## 2012-03-13 DIAGNOSIS — R1013 Epigastric pain: Secondary | ICD-10-CM

## 2012-03-13 DIAGNOSIS — F1120 Opioid dependence, uncomplicated: Secondary | ICD-10-CM | POA: Diagnosis present

## 2012-03-13 DIAGNOSIS — F1320 Sedative, hypnotic or anxiolytic dependence, uncomplicated: Secondary | ICD-10-CM | POA: Diagnosis present

## 2012-03-13 DIAGNOSIS — F329 Major depressive disorder, single episode, unspecified: Secondary | ICD-10-CM | POA: Diagnosis present

## 2012-03-13 NOTE — Progress Notes (Signed)
Patient ID: Cheryl Cross, female   DOB: 21-Oct-1966, 46 y.o.   MRN: [id number] Pt complained of abd pain, stating she was constipated. Stated that she saw a trace of blood from her rectum after a bowel movement.  Stated her stool was "hard".  Pt requested a laxative and writer adm MOM and informed her that a note would be left for a stool softener. Support and encouragement was offered.

## 2012-03-13 NOTE — Progress Notes (Signed)
S/O: Pt seen and evaluated in treatment team.  Reviewed short term and long term goals, medications, current treatment in the hospital and acute/chronic safety.  Pt denied any current thoughts of self harm, suicidal ideation or homicidal ideation.  Contracted for safety on the unit.    Patient stated that her mood was "not good". Her affect was mood congruent and anxious. She denied any current thoughts of self injurious behavior, suicidal ideation or homicidal ideation. There were no auditory or visual hallucinations, paranoia, delusional thought processes, or mania noted. Thought process was linear and goal directed. No psychomotor agitation or retardation was noted. Speech was normal rate, tone and volume. Eye contact was good. Judgment and insight are fair. Patient has been up and engaged on the unit. No acute safety concerns reported from team.   A/P: Opioid and Benzodiazepine W/D; Depressive Disorder NOS; Fibromyalgia, Chronic Pain; Insomnia  -Initiated Neurontin for pain. -Changed dosing pattern of Naproxen to better fit pt needs. -Allowed pt to bring in cane and use pillow in groups as needed for comfort. -Ordered Lidocaine patch to assist with moderate cervical pain which has been chronic for pt. -Continue other meds and treatment plan as noted.  Pt and team in agreement with plan.

## 2012-03-13 NOTE — Progress Notes (Signed)
[facility] Group Notes:  (Counselor/Nursing/MHT/Case Management/Adjunct)  03/13/2012 4:10 PM  Type of Therapy:  Group Therapy  Participation Level:  Active  Participation Quality:  Appropriate  Affect:  Appropriate  Cognitive:  Appropriate  Insight:  Limited  Engagement in Group:  Good  Engagement in Therapy:  Limited  Modes of Intervention:  Clarification, Socialization and Support  Summary of Progress/Problems:  Cheryl Cross participated in activity in which patients choose photographs to represent what their life would look and feel like were it in balance and another for out of balance. 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    Clide Dales 03/13/2012, 4:16 PM  [facility] Group Notes:  (Counselor/Nursing/MHT/Case Management/Adjunct)  03/13/2012 4:16 PM  Type of Therapy:  Group Therapy at 1:15  Participation Level:  Active  Participation Quality:  Attentive and Sharing  Affect:  Appropriate  Cognitive:  Appropriate  Insight:  Good  Engagement in Group:  Good  Engagement in Therapy:  Good  Modes of Intervention:  Clarification, Socialization and Support  Summary of Progress/Problems:  Cheryl Cross shared how her life seems to be missing serenity and self care and then later asked "Is it wrong that I'm so worried about patient number 1 and patient number 2?"  Others in group pointed out that thoughts about patient number 1 and 2 can often be an excuse to not pay attention to our own lives. Patient identified with that theory.    Clide Dales 03/13/2012, 4:21 PM

## 2012-03-13 NOTE — Progress Notes (Addendum)
Patient ID: Cheryl Cross, female   DOB: 1966/02/28, 45 y.o.   MRN: [id number] She She has been up and about and to group. Interacting with peers and staff. Self inventory:  Depressed 3, hopeless 2, denies SI thought and has not requested additional pain medication this AM. Says that the lidocaine patch is working. Her b/p has been running low 92/60 p 61 setting. Standing 95/63 p 75. Pt. Said that her b/p always runs low. Maggie NP made aware of low b/p.  Pt. Has been encourage to drink plenty of fluids and she says that she has increased  Fluid intake.
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
## 2012-03-13 NOTE — Discharge Planning (Signed)
Cheryl Cross attended AM group.  Feeling more hopeful today.  Lidocane patch addressed acute neck pain.  Now worried that she will not be able to afford meds she is on.  Told her I would ask NP to put together a list of meds and costs.

## 2012-03-13 NOTE — Progress Notes (Signed)
Patient uses cane

## 2012-03-13 NOTE — Progress Notes (Signed)
Patient ID: Cheryl Cross, female   DOB: August 29, 1966, 46 y.o.   MRN: [id number] Fully alert female, calm, cooperative. She is up and dressed and socializing in the day room. She reports she feels better this morning after a very difficult night. Had problems with initially been constipated for the past couple of days then loose stools through the night. She has a history of irritable bowel syndrome. She's feeling much better this morning. Says that the Lidoderm has alleviated her neck pain considerably. She rates her depression at 3/10 on a 1-10 scale if 10 is the worst symptoms. Anxiety 4-5/10, hopelessness 0/10.  Mental status exam: Fully alert female, pleasant, cooperative, up and mobile ambulating with a cane. Affect much brighter than yesterday and she is in no distress. Thinking is goal-directed, no dangerous ideas. Speech and thoughts are normally formed. Evidence of psychosis. No excessive rumination. Impulse control, and judgment are normal.  Plan:  Continue current plan.  I will give her some Bentyl prn for intestinal spasms, IBS symptoms.

## 2012-03-14 NOTE — Progress Notes (Signed)
Pt pleasant on approach, denies SI/HI/hallucinations.  Pt interacting appropriately on unit.  Pt states she is going home tomorrow and is ready to do so.  No complaints voiced.  Support and encouragement offered, will continue to monitor.

## 2012-03-14 NOTE — Progress Notes (Signed)
[facility] Group Notes:  (Counselor/Nursing/MHT/Case Management/Adjunct)  03/14/2012 5:16 PM  Type of Therapy:  Group Therapy  Participation Level:  Active  Participation Quality:  Monopolizing, Resistant and Sharing  Affect:  Anxious  Cognitive:  Oriented  Insight:  Limited  Engagement in Group:  Good  Engagement in Therapy:  Good  Modes of Intervention:  Limit-setting and Support  Summary of Progress/Problems:  Cheryl Cross shared extensively about the events that have had a negative impact on her life and may have led to her using.  Limits were set to which she insisted on breaking and group listened to her list and then moved on to discuss PAWS without acknowledging her disclosures more.  Others in group began to respond with "that's not as bad as ____ that happened to me" yet they responded well.  Patient was called out by physician and missed discussion on Post Acute Withdrawal Syndrome (PAWS).    Clide Dales 03/14/2012, 5:20 PM  [facility] Group Notes:  (Counselor/Nursing/MHT/Case Management/Adjunct)  03/14/2012 5:20 PM  Type of Therapy:  Group Therapy  Participation Level:  Active  Participation Quality:  Attentive and Sharing  Affect:  Appropriate  Cognitive:  Appropriate  Insight:  Limited  Engagement in Group:  Good  Engagement in Therapy:  Good  Modes of Intervention:  Clarification, Socialization and Support  Summary of Progress/Problems:  Patient was attentive and much more appropriate in this group than earlier one today. Cheryl Cross shared she uses pain pills for both physical and emotional pain and also to reduce tension but all it does is help her forget for a short while and the tension remains even increasing.    Clide Dales 03/14/2012, 5:24 PM

## 2012-03-14 NOTE — Discharge Planning (Signed)
Josilynn attended AM group, good participation.  Good mood.  Hopes to be able to d/c tomorrow.  Will return home.  Follow up outpt, either [facility] or [facility].  Will confirm today.

## 2012-03-14 NOTE — Progress Notes (Signed)
Pt rates depression at a 1 and hopelessness at a 0. Pt attends groups and interacts well with peers and staff. Pt was offered support and encouragement. Pt denies SI/HI. Pt is receptive to treatment and safety is maintained on unit.

## 2012-03-14 NOTE — Progress Notes (Signed)
Lying quietly in bed with eyes closed.  Q 15 minute safety checks are being conducted to maintain safety.

## 2012-03-14 NOTE — Progress Notes (Signed)
Patient ID: Cheryl Cross, female   DOB: [birth year], 46 y.o.   MRN: [id number] Cheryl Cross presents with a bright affect, she is up and has been participating in all groups, and is ambulating with her cane. She says she feels "wonderful". Pain is greatly decreased. 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  She is hopeful that she can manage her pain at a level 5/10 if 10 is the worst symptoms. She feels it's greatly improved at this level over when she was admitted. Plans to return to swimming to help control pain also, this has been very effective for her in the past.   She reports her sleep is good, appetite is good. She rates her depression a 1/10 on a 1-10 scale, print or hopelessness is 0, rates her anxiety is 0. We discussed risks and benefits and costs of various medications and discussed discharging her with samples. She declines the idea of following up with a neurologist for her chronic pain, and fibromyalgia, and plans to continue with her primary care physician. She is interested in counseling followup. She is thinking about discharge and is requesting to be discharged tomorrow.  Mental status exam: Fully alert female, pleasant, cooperative. She is in full contact with reality. Thinking is goal oriented, nonpsychotic, thoughts and speech are normally organized. No dangerous ideas. Affect is full, mood is neutral.   Plan: Continue current medications. Consider discharge in the morning is stable.

## 2012-03-14 NOTE — Progress Notes (Signed)
[facility] Case Management Discharge Plan:  Will you be returning to the same living situation after discharge: Yes,  home At discharge, do you have transportation home?:Yes,  family Do you have the ability to pay for your medications:Yes,  insurance  Interagency Information:     Release of information consent forms completed and in the chart;  Patient's signature needed at discharge.  Patient to Follow up at:  Follow-up Information    Follow up with [facility]. (I left a message for Ruby on friday afternoon.  she did not get back with me.  If she calls me on Monday and gives me an appointment, I will call you.  Otherwise, call yourself on Monday to set up an appointment)    Contact information:   [address]    [phone]      Follow up with [facility]. (Back up plan)    Contact information:   [address] [facility]  [336] [phone]         Patient denies SI/HI:   Yes,  yes    Safety Planning and Suicide Prevention discussed:  Yes,  yes  Barrier to discharge identified:No.  Summary and Recommendations:   Ida Rogue 03/14/2012, 3:32 PM

## 2012-03-15 DIAGNOSIS — F1120 Opioid dependence, uncomplicated: Secondary | ICD-10-CM

## 2012-03-15 DIAGNOSIS — F1520 Other stimulant dependence, uncomplicated: Secondary | ICD-10-CM

## 2012-03-15 MED ORDER — SOLIFENACIN SUCCINATE 10 MG PO TABS: 10.0000 mg | ORAL_TABLET | Freq: Every day | ORAL | Status: DC

## 2012-03-15 MED ORDER — ADULT MULTIVITAMIN W/MINERALS CH: 1.0000 | ORAL_TABLET | Freq: Every day | ORAL | Status: DC

## 2012-03-15 MED ORDER — ASPIRIN 81 MG PO TABS: 81.0000 mg | ORAL_TABLET | Freq: Every day | ORAL | Status: DC

## 2012-03-15 MED ORDER — VITAMIN D3 50 MCG (2000 UT) PO TABS: 1.0000 | ORAL_TABLET | Freq: Every day | ORAL | Status: DC

## 2012-03-15 MED ORDER — CITALOPRAM HYDROBROMIDE 20 MG PO TABS: 20.0000 mg | ORAL_TABLET | Freq: Every day | ORAL | Status: DC

## 2012-03-15 MED ORDER — GABAPENTIN 300 MG PO CAPS: 300.0000 mg | ORAL_CAPSULE | Freq: Three times a day (TID) | ORAL | Status: DC

## 2012-03-15 MED ORDER — ADULT MULTIVITAMIN W/MINERALS CH
1.0000 | ORAL_TABLET | Freq: Every day | ORAL | Status: DC
  Filled 2012-03-15: qty 14

## 2012-03-15 MED ORDER — LIDOCAINE 5 % EX PTCH: 1.0000 | MEDICATED_PATCH | CUTANEOUS | Status: AC

## 2012-03-15 MED ORDER — VITAMIN B-12 1000 MCG PO TABS: 1000.0000 ug | ORAL_TABLET | Freq: Every day | ORAL | Status: DC

## 2012-03-15 MED ORDER — METHOCARBAMOL 500 MG PO TABS: 500.0000 mg | ORAL_TABLET | Freq: Three times a day (TID) | ORAL | Status: DC

## 2012-03-15 MED ORDER — PREGABALIN 100 MG PO CAPS: 100.0000 mg | ORAL_CAPSULE | Freq: Three times a day (TID) | ORAL | Status: DC

## 2012-03-15 MED ORDER — NAPROXEN 500 MG PO TABS: 500.0000 mg | ORAL_TABLET | Freq: Two times a day (BID) | ORAL | Status: DC

## 2012-03-15 MED ORDER — TRAZODONE HCL 50 MG PO TABS: 50.0000 mg | ORAL_TABLET | Freq: Every day | ORAL | Status: DC

## 2012-03-15 NOTE — Progress Notes (Signed)
[facility] Case Management Discharge Plan:  Will you be returning to the same living situation after discharge: Yes,  at home with her niece and her boyfriend At discharge, do you have transportation home?:Yes,  pt's mom will pick pt up Do you have the ability to pay for your medications:Yes,  however pt worries about the cost of meds  Interagency Information:     Release of information consent forms completed and in the chart;  Patient's signature needed at discharge.  Patient to Follow up at:  Follow-up Information    Follow up with [facility]. (I left a message for Ruby on friday afternoon.  she did not get back with me.  If she calls me on Monday and gives me an appointment, I will call you.  Otherwise, call yourself on Monday to set up an appointment)    Contact information:   [address]  [place]  [phone]      Follow up with [facility]. (Back up plan)    Contact information:   [address] [facility]  [336] [phone]         Patient denies SI/HI:   Yes,      Safety Planning and Suicide Prevention discussed:  Yes,    Barrier to discharge identified:No.  Summary and Recommendations:   Cheryl Cross 03/15/2012, 9:56 AM

## 2012-03-15 NOTE — Progress Notes (Signed)
Patient ID: Cheryl Cross, female   DOB: 02/19/1966, 46 y.o.   MRN: [id number] Pt. attended and participated in aftercare planning group. Pt. accepted information on suicide prevention, warning signs to look for with suicide and crisis line numbers to use. The pt. agreed to call crisis line numbers if having warning signs or having thoughts of suicide. Pt. listed their current anxiety level as 1 and depression as 1 on scale of 1 to 10 with 10 being the high.

## 2012-03-15 NOTE — Progress Notes (Signed)
Patient ID: Cheryl Cross, female   DOB: November 17, 1965, 46 y.o.   MRN: [id number] Pt discharged to family this afternoon, pt denies SI/HI, pt provided with supply of medications and prescriptions, discharge instructions given and pt verbalized understanding, all belongings returned

## 2012-03-15 NOTE — [facility] Suicide Risk Assessment (Signed)
Suicide Risk Assessment  Discharge Assessment     Demographic factors:  Caucasian;Unemployed  (not applicable )  Current Mental Status Per Nursing Assessment::   On Admission:   (denies suicidal ideations) At Discharge:   (denies any si/hi/av)  Current Mental Status Per Physician:  Loss Factors: Decline in physical health;Financial problems / change in socioeconomic status  Historical Factors:  (none)  Risk Reduction Factors:   Sense of responsibility to family;Religious beliefs about death;Living with another person, especially a relative;Positive social support  Continued Clinical Symptoms:  Depression:   Comorbid alcohol abuse/dependence Alcohol/Substance Abuse/Dependencies Chronic Pain More than one psychiatric diagnosis Previous Psychiatric Diagnoses and Treatments Medical Diagnoses and Treatments/Surgeries  Discharge Diagnoses:   AXIS I:  Major depressive disorder; benzodiazepine dependence; opioid dependence AXIS II:  Deferred AXIS III:   Past Medical History  Diagnosis Date  . Polycythemia vera 12/07/2011  . Fibromyalgia   . Polycythemia vera   . Bursitis     hips bilat  . Arthritis     back   . DDD (degenerative disc disease)   . Overactive bladder   . Depression   . PTSD (post-traumatic stress disorder)   . Anxiety    AXIS IV:  other psychosocial or environmental problems AXIS V:  51-60 moderate symptoms  Suicide Risk:  Minimal: No identifiable suicidal ideation.  Patients presenting with no risk factors but with morbid ruminations; may be classified as minimal risk based on the severity of the depressive symptoms  The patient was seen and assessed prior to discharge. Ms. Whitsel reports feeling "very good" today and reports having a "postive experience" at [facility] She currently has no signs/sx of substance withdrawal, and reports no adverse effects from her current medication regimen. She slept well last night. She adamantly denies any suicidal  thoughts, intent or plan, or any homicidal thoughts, intent, or plan. Her plan after discharge is to begin working on progressive muscle relaxation techniques she learned at [facility], getting out of the house more, getting a pool pass, and beginning work on a CBT workbook she has. She hopes that these things in combination will help her better manage her pain and avoid narcotic relapse.  Plan Of Care/Follow-up recommendations:  1. Continue current medication regimen 2. Keep all follow up appointments 3. Avoid use of benzodiazepines and narcotics  Eligah East 03/15/2012, 11:31 AM

## 2012-03-18 NOTE — Progress Notes (Signed)
Patient Discharge Instructions:  After Visit Summary (AVS):   Faxed to:  03/18/2012 Face Sheet:   Faxed to:  03/18/2012 Psychiatric Admission Assessment Note:   Faxed to:  03/18/2012 Suicide Risk Assessment - Discharge Assessment:   Faxed to:  03/18/2012 Faxed/Sent to the Next Level Care provider:  03/18/2012 Next Level Care Provider Has Access to the EMR, 03/18/2012  Faxed to [facility] @ [phone] And records provided to [facility] vis [facility]/Epic access.  Wandra Scot, 03/18/2012, 5:05 PM

## 2012-03-18 NOTE — Discharge Summary (Signed)
Physician Discharge Summary Note  Patient:  Cheryl Cross is an 46 y.o., female MRN:  [id number] DOB:  06-Jun-1966 Patient phone:  [phone] (home)  Patient address:   [address] [place] [address],   Date of Admission:  03/10/2012 Date of Discharge: 03/15/2012  Discharge Diagnoses:  AXIS I: Major depressive disorder; benzodiazepine dependence; opioid dependence  AXIS II: Deferred  AXIS III:  Polycythemia Vera; Fibromyalgia; DDD.  Past Medical History   Diagnosis  Date   .  Polycythemia vera  12/07/2011   .  Fibromyalgia    .  Polycythemia vera    .  Bursitis      hips bilat   .  Arthritis      back   .  DDD (degenerative disc disease)    .  Overactive bladder    .  Depression    .  PTSD (post-traumatic stress disorder)    .  Anxiety    AXIS IV: other psychosocial or environmental problems  AXIS V: 51-60 moderate symptoms   Level of Care:  OP  Hospital Course:  First admission for [facility] who presented requesting detox from benzodiazepines and opiates. She reported taking 2 mg of alprazolam up to 4 or 5 times a day, and had also been taking extra doses of her opiate analgesic, over and above what was prescribed by her primary care physician, who has been treating her for chronic neck pain from degenerative disc disease, and fibromyalgia.  She endorsed hypersomnia, reclusiveness, significant anhedonia, lack of motivation, and not keeping up with her regular hygiene. She complained of a lot of chronic worrying and catastrophizing. She admitted to occasional passive suicidal thoughts but at no time did she have active suicidal thoughts or intent. 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  She was admitted to our dual diagnosis unit and given initial goals of safe detox from opiates and benzodiazepines. She was detox using Librium and  clonidine protocol center detox was uneventful. After discussion of risks and benefits, and options, we elected to start her on Celexa 20 mg daily and restarted gabapentin with a plan of ultimately increasing gabapentin dose, and tapering her off pregabalin. She slept well on Trazodone. Lidocaine transdermal patch provided relief for her chronic cervical disc pain. She was also given methocarbamol 500 mg 4 times a day which gave her skeletal muscle pain relief.  Her group participation was good, she was appropriate with peers and staff on the unit. Gradually her sleep improved significantly, her affect broadened, and she consistently denied suicidal thoughts. She was feeling much better and requested discharge on May 4. Her plan was to become active with swimming to relieve chronic pain, and she was beginning to work on the [employer] that she had been given. She will followup with outpatient treatment. And will return to live with her parents who are supportive.   Consults:  None  Significant Diagnostic Studies:  TSH of 0.896, free T4 0.97. Alcohol screen negative. Urine drug screen positive for opiates and benzodiazepine metabolites.  Discharge Vitals:   Blood pressure 89/56, pulse 69, temperature 97.7 F (36.5 C), temperature source Oral, resp. rate 16, height 5\' 6"  (1.676 m), weight 69.854 kg (154 lb).  Mental Status Exam: See Mental Status Examination and Suicide Risk Assessment completed by Attending Physician prior to discharge.  Discharge destination:  Home  Is patient on multiple antipsychotic therapies at discharge:  No   Has Patient had three or more failed trials of antipsychotic monotherapy by history:  No  Recommended Plan for Multiple Antipsychotic Therapies: N/A  Discharge Orders    Future Appointments: Provider: Department: Dept Phone: Center:   04/11/2012 2:00 PM [facility]  None     Medication List  As of 03/18/2012  8:29 AM   STOP taking these  medications         alprazolam 2 MG tablet      escitalopram 20 MG tablet      HYDROcodone-acetaminophen 10-325 MG per tablet      naproxen sodium 220 MG tablet         TAKE these medications      Indication    aspirin 81 MG tablet   Take 1 tablet (81 mg total) by mouth daily. For Polycythemia Vera.       citalopram 20 MG tablet   Commonly known as: CELEXA   Take 1 tablet (20 mg total) by mouth daily. For depression and anxiety.       gabapentin 300 MG capsule   Commonly known as: NEURONTIN   Take 1 capsule (300 mg total) by mouth 3 (three) times daily. For Chronic pain/Fibromyalgia.       lidocaine 5 %   Commonly known as: LIDODERM   Place 1 patch onto the skin daily. Remove & Discard patch within 12 hours or as directed by MD. For focal cervical spine pain.       methocarbamol 500 MG tablet   Commonly known as: ROBAXIN   Take 1 tablet (500 mg total) by mouth 4 (four) times daily - after meals and at bedtime. For muscle spasms.       mulitivitamin with minerals Tabs   Take 1 tablet by mouth daily. Vitamin supplement.       naproxen 500 MG tablet   Commonly known as: NAPROSYN   Take 1 tablet (500 mg total) by mouth 2 (two) times daily with a meal. For Fibromyalgia/chronic pain.       pregabalin 100 MG capsule   Commonly known as: LYRICA   Take 1 capsule (100 mg total) by mouth 3 (three) times daily. For fibromyalgia       solifenacin 10 MG tablet   Commonly known as: VESICARE   Take 1 tablet (10 mg total) by mouth daily. For overactive bladder.       traZODone 50 MG tablet   Commonly known as: DESYREL   Take 1 tablet (50 mg total) by mouth at bedtime. For sleep.       vitamin B-12 1000 MCG tablet   Commonly known as: CYANOCOBALAMIN   Take 1 tablet (1,000 mcg total) by mouth daily. Vitamin B supplement.       Vitamin D3 2000 UNITS Tabs   Take 1 tablet by mouth daily. Vitamin D Supplement.            Follow-up Information    Follow up with [facility] (I left a message for Ruby on friday afternoon.  she did not get back with me.  If she calls me on Monday and gives me an appointment, I will call you.  Otherwise, call yourself on Monday to set up an appointment)    Contact information:   [address]  [place]  [phone]      Follow up with [facility]. (Back up plan)    Contact information:   [address] [place] [facility]  [phone]         Follow-up recommendations:  Activity:  unrestricted Diet:  regular  Signed: Eloyce Bultman A 03/18/2012, 8:29 AM

## 2012-03-19 ENCOUNTER — Encounter (HOSPITAL_COMMUNITY): Payer: Self-pay | Admitting: Family Medicine

## 2012-03-19 ENCOUNTER — Emergency Department (HOSPITAL_COMMUNITY)
Admission: EM | Admit: 2012-03-19 | Discharge: 2012-03-20 | Disposition: A | Discharge status: Home / Self Care | Payer: BC Managed Care – PPO | Attending: Emergency Medicine | Admitting: Emergency Medicine

## 2012-03-19 DIAGNOSIS — T887XXA Unspecified adverse effect of drug or medicament, initial encounter: Secondary | ICD-10-CM | POA: Insufficient documentation

## 2012-03-19 DIAGNOSIS — R232 Flushing: Secondary | ICD-10-CM | POA: Insufficient documentation

## 2012-03-19 DIAGNOSIS — Z7982 Long term (current) use of aspirin: Secondary | ICD-10-CM | POA: Insufficient documentation

## 2012-03-19 DIAGNOSIS — F3289 Other specified depressive episodes: Secondary | ICD-10-CM | POA: Insufficient documentation

## 2012-03-19 DIAGNOSIS — E860 Dehydration: Secondary | ICD-10-CM | POA: Insufficient documentation

## 2012-03-19 DIAGNOSIS — R5381 Other malaise: Secondary | ICD-10-CM | POA: Insufficient documentation

## 2012-03-19 DIAGNOSIS — R231 Pallor: Secondary | ICD-10-CM | POA: Insufficient documentation

## 2012-03-19 DIAGNOSIS — M542 Cervicalgia: Secondary | ICD-10-CM | POA: Insufficient documentation

## 2012-03-19 DIAGNOSIS — F17200 Nicotine dependence, unspecified, uncomplicated: Secondary | ICD-10-CM | POA: Insufficient documentation

## 2012-03-19 DIAGNOSIS — T50905A Adverse effect of unspecified drugs, medicaments and biological substances, initial encounter: Secondary | ICD-10-CM

## 2012-03-19 DIAGNOSIS — IMO0002 Reserved for concepts with insufficient information to code with codable children: Secondary | ICD-10-CM | POA: Insufficient documentation

## 2012-03-19 DIAGNOSIS — Z9889 Other specified postprocedural states: Secondary | ICD-10-CM | POA: Insufficient documentation

## 2012-03-19 DIAGNOSIS — F329 Major depressive disorder, single episode, unspecified: Secondary | ICD-10-CM | POA: Insufficient documentation

## 2012-03-19 DIAGNOSIS — R110 Nausea: Secondary | ICD-10-CM | POA: Insufficient documentation

## 2012-03-19 DIAGNOSIS — I10 Essential (primary) hypertension: Secondary | ICD-10-CM | POA: Insufficient documentation

## 2012-03-19 DIAGNOSIS — M129 Arthropathy, unspecified: Secondary | ICD-10-CM | POA: Insufficient documentation

## 2012-03-19 DIAGNOSIS — Z79899 Other long term (current) drug therapy: Secondary | ICD-10-CM | POA: Insufficient documentation

## 2012-03-19 DIAGNOSIS — R5383 Other fatigue: Secondary | ICD-10-CM | POA: Insufficient documentation

## 2012-03-19 DIAGNOSIS — F411 Generalized anxiety disorder: Secondary | ICD-10-CM | POA: Insufficient documentation

## 2012-03-19 LAB — POCT I-STAT, CHEM 8
Chloride: 106 mEq/L (ref 96–112)
Glucose, Bld: 99 mg/dL (ref 70–99)
HCT: 44 % (ref 36.0–46.0)
Hemoglobin: 15 g/dL (ref 12.0–15.0)
Potassium: 3.7 mEq/L (ref 3.5–5.1)
Sodium: 141 mEq/L (ref 135–145)

## 2012-03-19 LAB — CK TOTAL AND CKMB (NOT AT ARMC)
CK, MB: 1.1 ng/mL (ref 0.3–4.0)
Total CK: 17 U/L (ref 7–177)

## 2012-03-19 MED ORDER — SODIUM CHLORIDE 0.9 % IV BOLUS (SEPSIS)
1000.0000 mL | Freq: Once | INTRAVENOUS | Status: AC
  Administered 2012-03-19: 1000 mL via INTRAVENOUS

## 2012-03-19 NOTE — ED Provider Notes (Signed)
History     CSN: [id number]  Arrival date & time 03/19/12  1850   First MD Initiated Contact with Patient 03/19/12 2206      Chief Complaint  Patient presents with  . Fatigue    (Consider location/radiation/quality/duration/timing/severity/associated sxs/prior treatment) HPI Comments: Patient states she was discharged from behavioral health on Saturday, where she was restarted on gabapentin and Robaxin for her fibromyalgia.  She has taken gabapentin and Flexeril in the past without reactions hives and generally feeling unwell.  Tonight.  She decreased the amount of her gabapentin to 200 mg, versus 300 mg, but still had a flushing sensation with the appearance of hives that have now resolved, and feeling weak  The history is provided by the patient.    Past Medical History  Diagnosis Date  . Polycythemia vera 12/07/2011  . Fibromyalgia   . Polycythemia vera   . Bursitis     hips bilat  . Arthritis     back   . DDD (degenerative disc disease)   . Overactive bladder   . Depression   . PTSD (post-traumatic stress disorder)   . Anxiety     Past Surgical History  Procedure Date  . Abdominal hysterectomy   . Lumbar fusion     No family history on file.  History  Substance Use Topics  . Smoking status: Current Everyday Smoker -- 2.0 packs/day for 15 years    Types: Cigarettes  . Smokeless tobacco: Never Used  . Alcohol Use: No    OB History    Grav Para Term Preterm Abortions TAB SAB Ect Mult Living                  Review of Systems  Constitutional: Positive for activity change and fatigue. Negative for fever and appetite change.  HENT: Positive for neck pain.   Respiratory: Negative for shortness of breath.   Gastrointestinal: Positive for nausea. Negative for vomiting.  Genitourinary: Negative for dysuria.  Musculoskeletal: Positive for myalgias.  Neurological: Positive for weakness. Negative for dizziness and headaches.    Allergies  Cyclobenzaprine;  Diclofenac sodium; and Penicillins  Home Medications   Current Outpatient Rx  Name Route Sig Dispense Refill  . ASPIRIN 81 MG PO TABS Oral Take 1 tablet (81 mg total) by mouth daily. For Polycythemia Vera.    Marland Kitchen VITAMIN D3 2000 UNITS PO TABS Oral Take 1 tablet by mouth daily. Vitamin D Supplement.    Marland Kitchen CITALOPRAM HYDROBROMIDE 20 MG PO TABS Oral Take 1 tablet (20 mg total) by mouth daily. For depression and anxiety. 30 tablet 0  . GABAPENTIN 300 MG PO CAPS Oral Take 1 capsule (300 mg total) by mouth 3 (three) times daily. For Chronic pain/Fibromyalgia. 90 capsule 0  . LIDOCAINE 5 % EX PTCH Transdermal Place 1 patch onto the skin daily. Remove & Discard patch within 12 hours or as directed by MD. For focal cervical spine pain. 30 patch 0  . METHOCARBAMOL 500 MG PO TABS Oral Take 1 tablet (500 mg total) by mouth 4 (four) times daily - after meals and at bedtime. For muscle spasms. 120 tablet 0  . ADULT MULTIVITAMIN W/MINERALS CH Oral Take 1 tablet by mouth daily. Vitamin supplement.    Marland Kitchen NAPROXEN 500 MG PO TABS Oral Take 1 tablet (500 mg total) by mouth 2 (two) times daily with a meal. For Fibromyalgia/chronic pain. 60 tablet 0  . PREGABALIN 100 MG PO CAPS Oral Take 1 capsule (100 mg total) by mouth  3 (three) times daily. For fibromyalgia    . SOLIFENACIN SUCCINATE 10 MG PO TABS Oral Take 1 tablet (10 mg total) by mouth daily. For overactive bladder.    . TRAZODONE HCL 50 MG PO TABS Oral Take 1 tablet (50 mg total) by mouth at bedtime. For sleep. 30 tablet 0  . VITAMIN B-12 1000 MCG PO TABS Oral Take 1 tablet (1,000 mcg total) by mouth daily. Vitamin B supplement.      BP 156/89  Pulse 93  Temp(Src) 98.7 F (37.1 C) (Oral)  Resp 20  Wt 165 lb (74.844 kg)  SpO2 100%  Physical Exam  Constitutional: She is oriented to person, place, and time. She appears well-developed and well-nourished.  HENT:  Head: Normocephalic.  Eyes: Pupils are equal, round, and reactive to light.  Neck: Normal range  of motion.  Cardiovascular: Normal rate.   Pulmonary/Chest: Effort normal.  Musculoskeletal: Normal range of motion. She exhibits no tenderness.  Neurological: She is alert and oriented to person, place, and time.  Skin: Skin is warm and dry. No rash noted. There is pallor.       Cheeks are flushed    ED Course  Procedures (including critical care time)   Labs Reviewed  CK TOTAL AND CKMB   No results found.   No diagnosis found.    MDM   Adverse reaction to gabapentin, perhaps to Robaxin, as well as she is "allergic to cyclobenzaprine        Arman Filter, NP 03/24/12 2005

## 2012-03-19 NOTE — ED Notes (Addendum)
Patient states that she was seen at [facility] last week and was started on Gabapentin and Robaxin. Now states that she is having extreme fatigue and weakness since then. Started "breaking out in hives" while sitting in the waiting room. Was d/c'd from [facility] on Saturday.

## 2012-03-20 ENCOUNTER — Emergency Department (HOSPITAL_COMMUNITY)
Admission: EM | Admit: 2012-03-20 | Discharge: 2012-03-21 | Disposition: A | Discharge status: Home / Self Care | Payer: BC Managed Care – PPO | Attending: Emergency Medicine | Admitting: Emergency Medicine

## 2012-03-20 DIAGNOSIS — R5381 Other malaise: Secondary | ICD-10-CM | POA: Insufficient documentation

## 2012-03-20 DIAGNOSIS — F411 Generalized anxiety disorder: Secondary | ICD-10-CM | POA: Insufficient documentation

## 2012-03-20 DIAGNOSIS — IMO0001 Reserved for inherently not codable concepts without codable children: Secondary | ICD-10-CM | POA: Insufficient documentation

## 2012-03-20 DIAGNOSIS — M797 Fibromyalgia: Secondary | ICD-10-CM

## 2012-03-20 DIAGNOSIS — R450 Nervousness: Secondary | ICD-10-CM | POA: Insufficient documentation

## 2012-03-20 DIAGNOSIS — R5383 Other fatigue: Secondary | ICD-10-CM

## 2012-03-20 MED ORDER — SODIUM CHLORIDE 0.9 % IV BOLUS (SEPSIS)
1000.0000 mL | Freq: Once | INTRAVENOUS | Status: AC
  Administered 2012-03-20: 1000 mL via INTRAVENOUS

## 2012-03-20 NOTE — Discharge Instructions (Signed)
Dehydration, Adult Dehydration means your body does not have as much fluid as it needs. Your kidneys, brain, and heart will not work properly without the right amount of fluids and salt.  HOME CARE  Ask your doctor how to replace body fluid losses (rehydrate).   Drink enough fluids to keep your pee (urine) clear or pale yellow.   Drink small amounts of fluids often if you feel sick to your stomach (nauseous) or throw up (vomit).   Eat like you normally do.   Avoid:   Foods or drinks high in sugar.   Bubbly (carbonated) drinks.   Juice.   Very hot or cold fluids.   Drinks with caffeine.   Fatty, greasy foods.   Alcohol.   Tobacco.   Eating too much.   Gelatin desserts.   Wash your hands to avoid spreading germs (bacteria, viruses).   Only take medicine as told by your doctor.   Keep all doctor visits as told.  GET HELP RIGHT AWAY IF:   You cannot drink something without throwing up.   You get worse even with treatment.   Your vomit has blood in it or looks greenish.   Your poop (stool) has blood in it or looks black and tarry.   You have not peed in 6 to 8 hours.   You pee a small amount of very dark pee.   You have a fever.   You pass out (faint).   You have belly (abdominal) pain that gets worse or stays in one spot (localizes).   You have a rash, stiff neck, or bad headache.   You get easily annoyed, sleepy, or are hard to wake up.   You feel weak, dizzy, or very thirsty.  MAKE SURE YOU:   Understand these instructions.   Will watch your condition.   Will get help right away if you are not doing well or get worse.  Document Released: 08/25/2009 Document Revised: 10/18/2011 Document Reviewed: 06/18/2011 [facility] Patient Information 2012 [place], [place]. Stop taking her Neurontin, and the Robaxin.  Make an appointment with your primary care physician for further advice on treating her fibromyalgia pain

## 2012-03-21 ENCOUNTER — Encounter (HOSPITAL_COMMUNITY): Payer: Self-pay | Admitting: Family Medicine

## 2012-03-21 ENCOUNTER — Ambulatory Visit (HOSPITAL_COMMUNITY): Payer: Self-pay | Admitting: Psychology

## 2012-03-21 LAB — DIFFERENTIAL
Basophils Relative: 0 % (ref 0–1)
Lymphocytes Relative: 16 % (ref 12–46)
Lymphs Abs: 1.3 10*3/uL (ref 0.7–4.0)
Monocytes Absolute: 0.7 10*3/uL (ref 0.1–1.0)
Monocytes Relative: 9 % (ref 3–12)
Neutro Abs: 6.1 10*3/uL (ref 1.7–7.7)
Neutrophils Relative %: 76 % (ref 43–77)

## 2012-03-21 LAB — COMPREHENSIVE METABOLIC PANEL
Albumin: 4.3 g/dL (ref 3.5–5.2)
Alkaline Phosphatase: 63 U/L (ref 39–117)
BUN: 3 mg/dL — ABNORMAL LOW (ref 6–23)
CO2: 23 mEq/L (ref 19–32)
Chloride: 104 mEq/L (ref 96–112)
Creatinine, Ser: 0.58 mg/dL (ref 0.50–1.10)
GFR calc non Af Amer: >90 mL/min (ref >90)
Glucose, Bld: 112 mg/dL — ABNORMAL HIGH (ref 70–99)
Potassium: 3.6 mEq/L (ref 3.5–5.1)
Total Bilirubin: 0.2 mg/dL — ABNORMAL LOW (ref 0.3–1.2)

## 2012-03-21 LAB — CBC
HCT: 40.9 % (ref 36.0–46.0)
Hemoglobin: 13.9 g/dL (ref 12.0–15.0)
MCHC: 34 g/dL (ref 30.0–36.0)
RBC: 4.59 MIL/uL (ref 3.87–5.11)
WBC: 8.1 10*3/uL (ref 4.0–10.5)

## 2012-03-21 MED ORDER — SODIUM CHLORIDE 0.9 % IV BOLUS (SEPSIS)
500.0000 mL | Freq: Once | INTRAVENOUS | Status: AC
  Administered 2012-03-21: 500 mL via INTRAVENOUS

## 2012-03-21 NOTE — Discharge Instructions (Signed)

## 2012-03-21 NOTE — ED Notes (Signed)
Pt calm, cooperative. Family at bedside. Pt states she is having pain in her legs, but this pain is ongoing. Resting quietly.

## 2012-03-21 NOTE — ED Notes (Signed)
Patient states she is here to be evaluated for "extreme fatigue and extreme nervousness." States she was seen here last night for dehydration.

## 2012-03-21 NOTE — ED Provider Notes (Signed)
History     CSN: [id number]  Arrival date & time 03/20/12  2232   First MD Initiated Contact with Patient 03/21/12 0031      Chief Complaint  Patient presents with  . Fatigue  . Anxiety    (Consider location/radiation/quality/duration/timing/severity/associated sxs/prior treatment) Patient is a 46 y.o. female presenting with anxiety. The history is provided by the patient.  Anxiety Associated symptoms include chest pain and abdominal pain. Pertinent negatives include no headaches and no shortness of breath.   patient states that she has extreme fatigue and nervousness. She was seen in ER for depression last night. She's also [facility] for [facility] diagnosis of opioid and benzo abuse and depression. She states that she's been able to manage at home very well. She states she feels as if she is going to die. She states she does use to come in to the hospital. She thinks her fibromyalgia is doing worse. She states she's not been able to eat and drink. No fevers. She states she's been constipated.  Past Medical History  Diagnosis Date  . Polycythemia vera 12/07/2011  . Fibromyalgia   . Polycythemia vera   . Bursitis     hips bilat  . Arthritis     back   . DDD (degenerative disc disease)   . Overactive bladder   . Depression   . PTSD (post-traumatic stress disorder)   . Anxiety     Past Surgical History  Procedure Date  . Abdominal hysterectomy   . Lumbar fusion     No family history on file.  History  Substance Use Topics  . Smoking status: Current Everyday Smoker -- 2.0 packs/day for 15 years    Types: Cigarettes  . Smokeless tobacco: Never Used  . Alcohol Use: No    OB History    Grav Para Term Preterm Abortions TAB SAB Ect Mult Living                  Review of Systems  Constitutional: Positive for fatigue. Negative for activity change and appetite change.  HENT: Negative for neck stiffness.   Eyes: Negative for pain.  Respiratory:  Negative for chest tightness and shortness of breath.   Cardiovascular: Positive for chest pain. Negative for leg swelling.  Gastrointestinal: Positive for abdominal pain and constipation. Negative for nausea, vomiting and diarrhea.  Genitourinary: Positive for flank pain.  Musculoskeletal: Positive for myalgias and back pain.  Skin: Negative for rash.  Neurological: Positive for weakness. Negative for numbness and headaches.  Psychiatric/Behavioral: Negative for behavioral problems.    Allergies  Cyclobenzaprine; Diclofenac sodium; Gabapentin; Penicillins; and Robaxin  Home Medications   Current Outpatient Rx  Name Route Sig Dispense Refill  . ASPIRIN 81 MG PO TABS Oral Take 1 tablet (81 mg total) by mouth daily. For Polycythemia Vera.    Marland Kitchen VITAMIN D 1000 UNITS PO TABS Oral Take 2,000 Units by mouth daily.    Marland Kitchen CITALOPRAM HYDROBROMIDE 20 MG PO TABS Oral Take 1 tablet (20 mg total) by mouth daily. For depression and anxiety. 30 tablet 0  . LIDOCAINE 5 % EX PTCH Transdermal Place 1 patch onto the skin daily. Remove & Discard patch within 12 hours or as directed by MD. For focal cervical spine pain. 30 patch 0  . ADULT MULTIVITAMIN W/MINERALS CH Oral Take 1 tablet by mouth daily. Vitamin supplement.    Marland Kitchen NAPROXEN 500 MG PO TABS Oral Take 1 tablet (500 mg total) by mouth 2 (  two) times daily with a meal. For Fibromyalgia/chronic pain. 60 tablet 0  . PREGABALIN 100 MG PO CAPS Oral Take 1 capsule (100 mg total) by mouth 3 (three) times daily. For fibromyalgia    . SOLIFENACIN SUCCINATE 10 MG PO TABS Oral Take 1 tablet (10 mg total) by mouth daily. For overactive bladder.    . TRAZODONE HCL 50 MG PO TABS Oral Take 1 tablet (50 mg total) by mouth at bedtime. For sleep. 30 tablet 0  . VITAMIN B-12 1000 MCG PO TABS Oral Take 1 tablet (1,000 mcg total) by mouth daily. Vitamin B supplement.      BP 122/71  Pulse 81  Temp(Src) 98.9 F (37.2 C) (Oral)  Resp 22  SpO2 99%  Physical Exam  Nursing  note and vitals reviewed. Constitutional: She is oriented to person, place, and time. She appears well-developed and well-nourished.  HENT:  Head: Normocephalic and atraumatic.  Eyes: EOM are normal. Pupils are equal, round, and reactive to light.  Neck: Normal range of motion. Neck supple.  Cardiovascular: Normal rate, regular rhythm and normal heart sounds.   No murmur heard. Pulmonary/Chest: Effort normal and breath sounds normal. No respiratory distress. She has no wheezes. She has no rales.  Abdominal: Soft. Bowel sounds are normal. She exhibits no distension. There is no tenderness. There is no rebound and no guarding.  Musculoskeletal: Normal range of motion.  Neurological: She is alert and oriented to person, place, and time. No cranial nerve deficit.  Skin: Skin is warm and dry.  Psychiatric: She has a normal mood and affect. Her speech is normal.    ED Course  Procedures (including critical care time)  Labs Reviewed  COMPREHENSIVE METABOLIC PANEL - Abnormal; Notable for the following:    Glucose, Bld 112 (*)    BUN 3 (*)    Total Bilirubin 0.2 (*)    All other components within normal limits  CBC  DIFFERENTIAL   No results found.   1. Fatigue   2. Fibromyalgia       MDM  Patient has fatigue and nervousness. Multiple complaints. She was recently inpatient for depression and substance abuse. She was seen in ER yesterday for similar symptoms. Lab works reassuring. Patient was seen by the act team. She does not have any inpatient criteria. She'll be discharged home.        Juliet Rude. Rubin Payor, MD 03/21/12 [phone]

## 2012-03-21 NOTE — [facility] Counselor (Signed)
This clinician spoke with patient about need for inpatient care.  Patient denies any current SI, HI or A/V hallucinations.  She does report "negative thoughts" and imagines seeing blood.  She denies however that this is hallucinations.  Clinician was told by patient that she had pain in her neck and "hot spots" on neck and felt like she was having sweats.  Patient said that she thought that she was having detox symptoms from pain medications.  She says however that her last use of pain medications was the week before last.  She was also discharged from [facility] on 05/04.  She has outpatient psychiatric care scheduled with a clinic in [place] on Wednesday of this next week (05/15).  Clinician explained to her that she did not currently meet criteria for inpatient psychiatric care and that she needed to keep her scheduled appt with [facility]  Clinician talked with Dr. Rubin Payor about patient not meeting criteria and that the nature of her complaints mainly being physical.  Dr. Rubin Payor said that she did not meet medical criteria at this time either.

## 2012-03-24 NOTE — ED Provider Notes (Signed)
Medical screening examination/treatment/procedure(s) were performed by non-physician practitioner and as supervising physician I was immediately available for consultation/collaboration.  Elder Davidian, MD 03/24/12 2334 

## 2012-03-26 ENCOUNTER — Ambulatory Visit (INDEPENDENT_AMBULATORY_CARE_PROVIDER_SITE_OTHER): Payer: BC Managed Care – PPO | Admitting: Psychology

## 2012-03-26 DIAGNOSIS — F329 Major depressive disorder, single episode, unspecified: Secondary | ICD-10-CM

## 2012-03-26 DIAGNOSIS — F419 Anxiety disorder, unspecified: Secondary | ICD-10-CM

## 2012-03-26 DIAGNOSIS — F411 Generalized anxiety disorder: Secondary | ICD-10-CM

## 2012-03-27 ENCOUNTER — Encounter (HOSPITAL_COMMUNITY): Payer: Self-pay | Admitting: Psychology
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
## 2012-03-31 ENCOUNTER — Ambulatory Visit (INDEPENDENT_AMBULATORY_CARE_PROVIDER_SITE_OTHER): Payer: BC Managed Care – PPO | Admitting: Psychology

## 2012-03-31 DIAGNOSIS — F411 Generalized anxiety disorder: Secondary | ICD-10-CM

## 2012-03-31 DIAGNOSIS — F329 Major depressive disorder, single episode, unspecified: Secondary | ICD-10-CM

## 2012-03-31 DIAGNOSIS — F419 Anxiety disorder, unspecified: Secondary | ICD-10-CM

## 2012-04-08 ENCOUNTER — Telehealth (HOSPITAL_COMMUNITY): Payer: Self-pay | Admitting: *Deleted

## 2012-04-09 ENCOUNTER — Other Ambulatory Visit (HOSPITAL_COMMUNITY): Payer: Self-pay | Admitting: *Deleted

## 2012-04-11 ENCOUNTER — Encounter (HOSPITAL_COMMUNITY)
Admission: RE | Admit: 2012-04-11 | Discharge: 2012-04-11 | Disposition: A | Discharge status: Home / Self Care | Payer: BC Managed Care – PPO | Source: Ambulatory Visit | Attending: Oncology | Admitting: Oncology

## 2012-04-11 DIAGNOSIS — D45 Polycythemia vera: Secondary | ICD-10-CM | POA: Insufficient documentation

## 2012-04-11 LAB — HEMOGLOBIN AND HEMATOCRIT, BLOOD
HCT: 38.4 % (ref 36.0–46.0)
Hemoglobin: 12.9 g/dL (ref 12.0–15.0)

## 2012-04-11 NOTE — Discharge Instructions (Signed)
Refer to printed sheet for next appointment. Short Stay Phone # [phone]  YOUR NEXT APPOINTMENT IN SHORT STAY IS August 01, 2012  2PM

## 2012-04-11 NOTE — Progress Notes (Signed)
HEMOGLOBIN 12.9 HCT 38.4 therapeutic PHLEBOTOMY NOT INDICATED AT THIS TIME PER CRITERIA OF ORDERS TO DO IF HGB .F OR EQUAL TO 14

## 2012-04-15 ENCOUNTER — Ambulatory Visit (INDEPENDENT_AMBULATORY_CARE_PROVIDER_SITE_OTHER): Payer: BC Managed Care – PPO | Admitting: Psychology

## 2012-04-15 ENCOUNTER — Encounter (HOSPITAL_COMMUNITY): Payer: Self-pay | Admitting: Psychology

## 2012-04-15 DIAGNOSIS — F411 Generalized anxiety disorder: Secondary | ICD-10-CM

## 2012-04-15 DIAGNOSIS — F419 Anxiety disorder, unspecified: Secondary | ICD-10-CM

## 2012-04-15 DIAGNOSIS — F329 Major depressive disorder, single episode, unspecified: Secondary | ICD-10-CM

## 2012-04-15 NOTE — Progress Notes (Signed)
Patient:  Cheryl Cross   DOB: June 19, 1966  MR Number: [id number]  Location: BEHAVIORAL [facility] [address] [place] [address] Dept: [phone]  Start: 1 PM End: 2 PM  Provider/Observer:     Hershal Coria PSYD  Chief Complaint:      Chief Complaint  Patient presents with  . Anxiety  . Depression    Reason For Service:     Patient was referred because of extreme anxiety and nervousness this continued after discharge from the hospital. She was hospitalized twice in the behavioral health unit and suffered from dehydration an allergic reaction to medicines. She describes major stressors right now have to do with her financial situation, family issues, coping with fibromyalgia, heightened pulse rate, and feelings of nervousness. The patient reports that she developed these current symptoms a few days after discharge from behavioral health and had to go back to the hospital due to dehydration an allergic reaction. She is currently staying with her parents and dealing with medication changes. The patient reports that she has medical conditions related to fibromyalgia, significant degenerative disc disease and Polycythemia vera.   Interventions Strategy:  Cognitive/behavioral psychotherapeutic interventions  Participation Level:   Active  Participation Quality:  Appropriate      Behavioral Observation:  Well Groomed, Alert, and Depressed.   Current Psychosocial Factors: The patient reports that she is continuing to struggle with numerous medical issues and issues related to anxiety and depression.  Content of Session:   Review current symptoms and continue to work on therapeutic interventions for issues of anxiety and depression as well as coping with her overall health and medical status.  Current Status:   The patient reports of depression and anxiety continue to be quite problematic for  her.  Patient Progress:   She has been actively working on the initial coping skills we worked on and we will continue look at this as well as issues related to her medications.  Target Goals:   Target goals including addressing the issues related to her depression and anxiety in coping with her medical status.  Last Reviewed:   03/31/2012  Goals Addressed Today:    Today we worked on cognitive behavioral coping skills for issues of depression.  Impression/Diagnosis:  This point, it is clear that the patient has been through a great deal of stress with family issues and difficulties with been able to maintain her job. 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 She has experienced pain and fatigue as a result. I think that she may actually not have fibromyalgia and some of her current symptoms could be the result of potential side effects due to the Lyrica. I have suggested she contact her primary care doctor about discontinuing Lyrica as well as some of the other medications that have been discontinued already.   Diagnosis:    Axis I:  1. Anxiety   2. Major depression         Axis II: Deferred

## 2012-04-15 NOTE — Progress Notes (Signed)
Patient:  Cheryl Cross   DOB: 15-Nov-1965  MR Number: [id number]  Location: BEHAVIORAL [facility] [address] [place] [address] Dept: [phone]  Start: 1 PM End: 2 PM  Provider/Observer:     Hershal Coria PSYD  Chief Complaint:      Chief Complaint  Patient presents with  . Depression  . Anxiety  . Stress    Reason For Service:     Patient was referred because of extreme anxiety and nervousness this continued after discharge from the hospital. She was hospitalized twice in the behavioral health unit and suffered from dehydration an allergic reaction to medicines. She describes major stressors right now have to do with her financial situation, family issues, coping with fibromyalgia, heightened pulse rate, and feelings of nervousness. The patient reports that she developed these current symptoms a few days after discharge from behavioral health and had to go back to the hospital due to dehydration an allergic reaction. She is currently staying with her parents and dealing with medication changes. The patient reports that she has medical conditions related to fibromyalgia, significant degenerative disc disease and Polycythemia vera.   Interventions Strategy:  Cognitive/behavioral psychotherapeutic interventions  Participation Level:   Active  Participation Quality:  Appropriate      Behavioral Observation:  Well Groomed, Alert, and Depressed.   Current Psychosocial Factors: The patient reports that she is still feeling very stressed and guilty for living primarily with her parents although her husband is out of the country and she does not have the money for TV or telephone service fair and she feels very worried and scared and lonely when she is there with no other services now that she is not working. However, she is continuing to work on getting back to her job on August 19. She is working  on a release from her physician so she can return to work at that time..  Content of Session:   Review current symptoms and continue to work on therapeutic interventions for issues of anxiety and depression as well as coping with her overall health and medical status.  Current Status:   The patient reports that her symptoms of depression while continuing have been better. She reports that feelings of anhedonia, feelings of helplessness and hopelessness, and social withdrawal are all improving. She would like to look at changes in medication and has an appointment with Dr. Toni Arthurs on Friday.  Patient Progress:   She has been actively working on the initial coping skills we worked on and we will continue look at this as well as issues related to her medications.  Target Goals:   Target goals including addressing the issues related to her depression and anxiety in coping with her medical status.  Last Reviewed:   04/15/2012  Goals Addressed Today:    Today we worked on cognitive behavioral coping skills for issues of depression.  Impression/Diagnosis:  This point, it is clear that the patient has been through a great deal of stress with family issues and difficulties with been able to maintain her job. 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 She has experienced pain and fatigue as a result. I think that she may actually not have fibromyalgia and some of her current symptoms could be the result of potential side  effects due to the Lyrica. I have suggested she contact her primary care doctor about discontinuing Lyrica as well as some of the other medications that have been discontinued already.   Diagnosis:    Axis I:  1. Major depression   2. Anxiety         Axis II: Deferred

## 2012-04-17 ENCOUNTER — Other Ambulatory Visit: Payer: Self-pay | Admitting: Oncology

## 2012-04-18 ENCOUNTER — Ambulatory Visit (INDEPENDENT_AMBULATORY_CARE_PROVIDER_SITE_OTHER): Payer: BC Managed Care – PPO | Admitting: Psychiatry

## 2012-04-18 ENCOUNTER — Encounter (HOSPITAL_COMMUNITY): Payer: Self-pay | Admitting: Psychiatry

## 2012-04-18 DIAGNOSIS — F329 Major depressive disorder, single episode, unspecified: Secondary | ICD-10-CM

## 2012-04-18 NOTE — Progress Notes (Signed)
Patient ID: Cheryl Cross, female   DOB: Aug 23, 1966, 46 y.o.   MRN: [id number] Reports long history of anxiety and mood dysregulation with downswings but no mania.  Family hx of : Sister with bipolar disorder,  Mult. People with alcoholism. Niece with bipolar. And cousins with mult. Problems.  Has overused xanax (8-10+mg./day) and pain meds as well as etoh in past.  Unusual reaction to meds in past months with "dehydration" after adm. To behav. Health for detox from xanax.  Has dx of fibromyalgia.

## 2012-04-30 ENCOUNTER — Ambulatory Visit (INDEPENDENT_AMBULATORY_CARE_PROVIDER_SITE_OTHER): Payer: BC Managed Care – PPO | Admitting: Psychology

## 2012-04-30 DIAGNOSIS — F419 Anxiety disorder, unspecified: Secondary | ICD-10-CM

## 2012-04-30 DIAGNOSIS — F411 Generalized anxiety disorder: Secondary | ICD-10-CM

## 2012-04-30 DIAGNOSIS — F329 Major depressive disorder, single episode, unspecified: Secondary | ICD-10-CM

## 2012-05-09 ENCOUNTER — Encounter (HOSPITAL_COMMUNITY): Payer: Self-pay | Admitting: Psychology

## 2012-05-09 NOTE — Progress Notes (Signed)
Patient:  Cheryl Cross   DOB: 16-Jun-1966  MR Number: [id number]  Location: BEHAVIORAL [facility] [address] [place] [address] Dept: [phone]  Start: 1 PM End: 2 PM  Provider/Observer:     Hershal Coria PSYD  Chief Complaint:      Chief Complaint  Patient presents with  . Depression  . Anxiety  . Stress    Reason For Service:     Patient was referred because of extreme anxiety and nervousness this continued after discharge from the hospital. She was hospitalized twice in the behavioral health unit and suffered from dehydration an allergic reaction to medicines. She describes major stressors right now have to do with her financial situation, family issues, coping with fibromyalgia, heightened pulse rate, and feelings of nervousness. The patient reports that she developed these current symptoms a few days after discharge from behavioral health and had to go back to the hospital due to dehydration an allergic reaction. She is currently staying with her parents and dealing with medication changes. The patient reports that she has medical conditions related to fibromyalgia, significant degenerative disc disease and Polycythemia vera.   Interventions Strategy:  Cognitive/behavioral psychotherapeutic interventions  Participation Level:   Active  Participation Quality:  Appropriate      Behavioral Observation:  Well Groomed, Alert, and Depressed.   Current Psychosocial Factors: The patient reports that she is coping better with her family issues that she is dealing with as well as medical issues..  Content of Session:   Review current symptoms and continue to work on therapeutic interventions for issues of anxiety and depression as well as coping with her overall health and medical status.  Current Status:   The patient reports that her symptoms of depression while continuing have been better.  She reports that feelings of anhedonia, feelings of helplessness and hopelessness, and social withdrawal are all improving. She would like to look at changes in medication and has had her appointment with Dr. Toni Arthurs.  Patient Progress:   She has been actively working on the initial coping skills we worked on and we will continue look at this as well as issues related to her medications.  Target Goals:   Target goals including addressing the issues related to her depression and anxiety in coping with her medical status.  Last Reviewed:   04/30/2012  Goals Addressed Today:    Today we worked on cognitive behavioral coping skills for issues of depression.  Impression/Diagnosis:  This point, it is clear that the patient has been through a great deal of stress with family issues and difficulties with been able to maintain her job. 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 She has experienced pain and fatigue as a result. I think that she may actually not have fibromyalgia and some of her current symptoms could be the result of potential side effects due to the Lyrica. I have suggested she contact her primary care doctor about discontinuing Lyrica as well as some of the other medications that have been discontinued already.   Diagnosis:    Axis I:  1. Major depression   2. Anxiety         Axis II: Deferred

## 2012-05-14 ENCOUNTER — Ambulatory Visit (INDEPENDENT_AMBULATORY_CARE_PROVIDER_SITE_OTHER): Payer: BC Managed Care – PPO | Admitting: Psychology

## 2012-05-14 DIAGNOSIS — F411 Generalized anxiety disorder: Secondary | ICD-10-CM

## 2012-05-14 DIAGNOSIS — F329 Major depressive disorder, single episode, unspecified: Secondary | ICD-10-CM

## 2012-05-14 DIAGNOSIS — F419 Anxiety disorder, unspecified: Secondary | ICD-10-CM

## 2012-05-16 ENCOUNTER — Encounter (HOSPITAL_COMMUNITY): Payer: Self-pay | Admitting: Psychiatry

## 2012-05-16 ENCOUNTER — Ambulatory Visit (INDEPENDENT_AMBULATORY_CARE_PROVIDER_SITE_OTHER): Payer: BC Managed Care – PPO | Admitting: Psychiatry

## 2012-05-16 DIAGNOSIS — F329 Major depressive disorder, single episode, unspecified: Secondary | ICD-10-CM

## 2012-05-16 NOTE — Progress Notes (Signed)
Patient ID: Cheryl Cross, female   DOB: 08-Jun-1966, 46 y.o.   MRN: [id number] Mood more stable.  Still has suicidal thoughts but no impulse to act on them.  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  Sees Dr. Elvera Lennox. To plan for restart.  Zoloft 50mg . Helping now.  Sleep ok with trazadone.

## 2012-06-05 ENCOUNTER — Ambulatory Visit (INDEPENDENT_AMBULATORY_CARE_PROVIDER_SITE_OTHER): Payer: BC Managed Care – PPO | Admitting: Psychology

## 2012-06-05 DIAGNOSIS — F329 Major depressive disorder, single episode, unspecified: Secondary | ICD-10-CM

## 2012-06-05 DIAGNOSIS — F411 Generalized anxiety disorder: Secondary | ICD-10-CM

## 2012-06-05 DIAGNOSIS — F419 Anxiety disorder, unspecified: Secondary | ICD-10-CM

## 2012-06-06 ENCOUNTER — Other Ambulatory Visit (HOSPITAL_BASED_OUTPATIENT_CLINIC_OR_DEPARTMENT_OTHER): Payer: BC Managed Care – PPO | Admitting: Lab

## 2012-06-06 DIAGNOSIS — D45 Polycythemia vera: Secondary | ICD-10-CM

## 2012-06-06 LAB — IRON AND TIBC
Iron: 81 ug/dL (ref 42–145)
TIBC: 326 ug/dL (ref 250–470)
UIBC: 245 ug/dL (ref 125–400)

## 2012-06-06 LAB — CBC & DIFF AND RETIC
BASO%: 0.3 % (ref 0.0–2.0)
Basophils Absolute: 0 10*3/uL (ref 0.0–0.1)
EOS%: 1.8 % (ref 0.0–7.0)
HGB: 14 g/dL (ref 11.6–15.9)
MCH: 30.7 pg (ref 25.1–34.0)
MCHC: 34.3 g/dL (ref 31.5–36.0)
MCV: 89.5 fL (ref 79.5–101.0)
MONO%: 7 % (ref 0.0–14.0)
NEUT%: 60.6 % (ref 38.4–76.8)
RDW: 13.8 % (ref 11.2–14.5)
Retic Ct Abs: 39.67 10*3/uL (ref 33.70–90.70)
lymph#: 1.9 10*3/uL (ref 0.9–3.3)

## 2012-06-06 LAB — COMPREHENSIVE METABOLIC PANEL
Albumin: 4.2 g/dL (ref 3.5–5.2)
BUN: 8 mg/dL (ref 6–23)
Calcium: 9.5 mg/dL (ref 8.4–10.5)
Chloride: 106 mEq/L (ref 96–112)
Creatinine, Ser: 0.65 mg/dL (ref 0.50–1.10)
Glucose, Bld: 79 mg/dL (ref 70–99)
Potassium: 4.5 mEq/L (ref 3.5–5.3)

## 2012-06-06 LAB — LACTATE DEHYDROGENASE: LDH: 96 U/L (ref 94–250)

## 2012-06-13 ENCOUNTER — Telehealth: Payer: Self-pay | Admitting: Oncology

## 2012-06-13 ENCOUNTER — Ambulatory Visit (HOSPITAL_BASED_OUTPATIENT_CLINIC_OR_DEPARTMENT_OTHER): Payer: BC Managed Care – PPO | Admitting: Oncology

## 2012-06-13 VITALS — BP 111/70 | HR 62 | Temp 98.3°F | Resp 20 | Ht 67.0 in | Wt 144.1 lb

## 2012-06-13 DIAGNOSIS — D45 Polycythemia vera: Secondary | ICD-10-CM

## 2012-06-13 NOTE — Telephone Encounter (Signed)
Gave pt appt for calendar for July  2014 and August 2014 lab and MD, Pt informed me that all phlebotomy are done at Short stay , RN fax orders

## 2012-06-13 NOTE — Progress Notes (Signed)
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 JAK2 gene analysis studies were negative in the past. Repetitive normal serum erythropoietin levels.  Nondiagnostic bone marrow biopsy.  She had hemoglobin levels as high as 19 g and associated constitutional symptoms.  She was started on a phlebotomy program in December, 2006.  She is currently getting phlebotomized every 4 months. She had significant improvement in her constitutional symptoms until last year when she developed new symptoms, unrelated to the polycythemia with progressive polyarthralgia, polymyalgia, and bone pain.  She had extensive evaluations in [place] and at [facility] including rheumatology, neurology, and a 2nd opinion from a hematologist.  She traced her symptoms to sequelae of an acute viral illness.  She was put on a trial of Lyrica, gabapentin, nonsteroidals, antidepressants and anxiolytics.  She believed she gained about 30 pounds on the Lyrica and stopped the drug.  She has weaned herself off the gabapentin, which did not really seem to be helping.  She has really taken some positive steps to help to adjust to these problems and is seeing a neuropsychologist and a psychiatrist in [place].  Her symptoms are at a minimum right now and she has learned to live with them.  She has really done a remarkable job in stopping almost all of her medications. Right now, she is just using a Lidoderm patch once a week and p.r.n. Naprosyn for pain.  She is on Zoloft 50 mg daily and Desyrel 50 mg at bedtime.  The only other medicines are aspirin 81 mg and Prilosec 20 mg daily.  She denies any headache or change in vision.  No cough, dyspnea, chest pain, or palpitations.  No calf swelling or pain.  I asked her a little bit more about her family history since the 2nd opinion hematologist thought she had a familial polycythemia.  The only person that she is  aware of who may have had a high red count was a paternal grandfather who died of leukemia when he was 57 years old. Both of her parents are still alive, mother in her 8s, father in his 24s and to her knowledge do not have any blood problems.  PHYSICAL EXAMINATION:  Weight down from 165 pounds in May of this year to 144 pounds.  Blood pressure is 111/70, pulse 62 regular, temp 98, respirations 20.  Head and neck:  Normal.  Pupils equal, reactive to light.  Optic disks sharp.  Vessels normal.  No retinal vein distention. No hemorrhage or exudate.  Lungs:  Clear and resonant to percussion. Regular cardiac rhythm.  No murmur or gallop.  Abdomen:  Soft and nontender.  No mass, no organomegaly. Extremities:  No edema.  No calf tenderness.  Neurologic:  Mental status intact.  Cranial nerves intact. Motor strength 5/5.  Reflexes 2+ symmetric.  LABORATORY DATA:  Chemistry profile normal.  Hemoglobin 14, hematocrit 40.8, MCV 89.5, white count 6100, platelets 355,000, done on June 06, 2012.  IMPRESSION: 1. Idiopathic polycythemia. Despite negative lab support, I still feel     that she likely has a variation of polycythemia vera.  She may have     a receptor defect.  Alternative diagnosis, which has not been     checked for, is a high affinity oxygen hemoglobin. I will continue     q.4 month phlebotomies.  Monitor iron studies while on treatment. 2. 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    She is doing much  better, as noted above.  I reinforced to her that     she is really doing a wonderful job in managing her symptoms.  I will see her again in 1 year.    ______________________________ Levert Feinstein, M.D., F.A.C.P. JMG/MEDQ  D:  06/13/2012  T:  06/13/2012  Job:  [id number]

## 2012-06-17 ENCOUNTER — Encounter (HOSPITAL_COMMUNITY): Payer: Self-pay | Admitting: Psychology

## 2012-06-17 NOTE — Progress Notes (Signed)
Patient:  Cheryl Cross   DOB: [birth year]  MR Number: [id number]  Location: BEHAVIORAL [facility] [address] [place] [address] Dept: [phone]  Start: 11 AM End: 12 PM  Provider/Observer:     Hershal Coria PSYD  Chief Complaint:      Chief Complaint  Patient presents with  . Anxiety  . Depression  . Stress    Reason For Service:     Patient was referred because of extreme anxiety and nervousness this continued after discharge from the hospital. She was hospitalized twice in the behavioral health unit and suffered from dehydration an allergic reaction to medicines. She describes major stressors right now have to do with her financial situation, family issues, coping with fibromyalgia, heightened pulse rate, and feelings of nervousness. The patient reports that she developed these current symptoms a few days after discharge from behavioral health and had to go back to the hospital due to dehydration an allergic reaction. She is currently staying with her parents and dealing with medication changes. The patient reports that she has medical conditions related to fibromyalgia, significant degenerative disc disease and Polycythemia vera.   Interventions Strategy:  Cognitive/behavioral psychotherapeutic interventions  Participation Level:   Active  Participation Quality:  Appropriate      Behavioral Observation:  Well Groomed, Alert, and Depressed.   Current Psychosocial Factors: The patient reports that she's been actively working on coping skills and strategies particularly with regard to her relationship with her family. She reports overall this is been helpful..  Content of Session:   Review current symptoms and continue to work on therapeutic interventions for issues of anxiety and depression as well as coping with her overall health and medical status.  Current Status:   The patient  reports that her symptoms of depression while continuing have been better. She reports that feelings of anhedonia, feelings of helplessness and hopelessness, and social withdrawal are all improving. She would like to look at changes in medication and has had her appointment with Dr. Toni Arthurs.  Patient Progress:   She has been actively working on the initial coping skills we worked on and we will continue look at this as well as issues related to her medications.  Target Goals:   Target goals including addressing the issues related to her depression and anxiety in coping with her medical status.  Last Reviewed:   05/14/2012  Goals Addressed Today:    Today we worked on cognitive behavioral coping skills for issues of depression.  Impression/Diagnosis:  This point, it is clear that the patient has been through a great deal of stress with family issues and difficulties with been able to maintain her job. 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 She has experienced pain and fatigue as a result. I think that she may actually not have fibromyalgia and some of her current symptoms could be the result of potential side effects due to the Lyrica. I have suggested she contact her primary care doctor about discontinuing Lyrica as well as some of the other medications that have been discontinued already.   Diagnosis:    Axis I:  1. Major depression   2. Anxiety         Axis II: Deferred

## 2012-06-17 NOTE — Progress Notes (Signed)
Patient:  Cheryl Cross   DOB: August 22, 1966  MR Number: [id number]  Location: BEHAVIORAL [facility] [address] [place] [address] Dept: [phone]  Start: 11 AM End: 12 PM  Provider/Observer:     Hershal Coria PSYD  Chief Complaint:      Chief Complaint  Patient presents with  . Depression  . Anxiety    Reason For Service:     Patient was referred because of extreme anxiety and nervousness this continued after discharge from the hospital. She was hospitalized twice in the behavioral health unit and suffered from dehydration an allergic reaction to medicines. She describes major stressors right now have to do with her financial situation, family issues, coping with fibromyalgia, heightened pulse rate, and feelings of nervousness. The patient reports that she developed these current symptoms a few days after discharge from behavioral health and had to go back to the hospital due to dehydration an allergic reaction. She is currently staying with her parents and dealing with medication changes. The patient reports that she has medical conditions related to fibromyalgia, significant degenerative disc disease and Polycythemia vera.   Interventions Strategy:  Cognitive/behavioral psychotherapeutic interventions  Participation Level:   Active  Participation Quality:  Appropriate      Behavioral Observation:  Well Groomed, Alert, and Depressed.   Current Psychosocial Factors: The patient reports that she has again had more stressors coping with all the demands her family is putting on her. However, she reports that she has not experienced level of depressive responses that she is had in the past. She reports that she is doing better with her current medication regimen..  Content of Session:   Review current symptoms and continue to work on therapeutic interventions for issues of anxiety and depression  as well as coping with her overall health and medical status.  Current Status:   The patient reports that she is doing better with her medications and has been actively working on coping skills we have developed as better managing her situation. However, she continues to describe significant symptoms of depression and anxiety but they are improved.  Patient Progress:   She has been actively working on the initial coping skills we worked on and we will continue look at this as well as issues related to her medications.  Target Goals:   Target goals including addressing the issues related to her depression and anxiety in coping with her medical status.  Last Reviewed:   06/05/2012  Goals Addressed Today:    Today we worked on cognitive behavioral coping skills for issues of depression.  Impression/Diagnosis:  This point, it is clear that the patient has been through a great deal of stress with family issues and difficulties with been able to maintain her job. 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 She has experienced pain and fatigue as a result. I think that she may actually not have fibromyalgia and some of her current symptoms could be the result of potential side effects due to the Lyrica. I have suggested she contact her primary care doctor about discontinuing Lyrica as well as some of the other medications that have been discontinued already.   Diagnosis:    Axis I:  1. Major depression   2. Anxiety  Axis II: Deferred

## 2012-06-19 ENCOUNTER — Ambulatory Visit (INDEPENDENT_AMBULATORY_CARE_PROVIDER_SITE_OTHER): Payer: BC Managed Care – PPO | Admitting: Psychology

## 2012-06-19 DIAGNOSIS — F329 Major depressive disorder, single episode, unspecified: Secondary | ICD-10-CM

## 2012-06-19 DIAGNOSIS — F411 Generalized anxiety disorder: Secondary | ICD-10-CM

## 2012-06-19 DIAGNOSIS — F419 Anxiety disorder, unspecified: Secondary | ICD-10-CM

## 2012-06-27 ENCOUNTER — Ambulatory Visit (INDEPENDENT_AMBULATORY_CARE_PROVIDER_SITE_OTHER): Payer: BC Managed Care – PPO | Admitting: Psychiatry

## 2012-06-27 ENCOUNTER — Encounter (HOSPITAL_COMMUNITY): Payer: Self-pay | Admitting: Psychiatry

## 2012-06-27 DIAGNOSIS — F429 Obsessive-compulsive disorder, unspecified: Secondary | ICD-10-CM

## 2012-06-27 MED ORDER — SERTRALINE HCL 100 MG PO TABS: 100.0000 mg | ORAL_TABLET | Freq: Every day | ORAL | Status: DC

## 2012-06-27 NOTE — Progress Notes (Signed)
Patient ID: Cheryl Cross, female   DOB: 12-Aug-1966, 46 y.o.   MRN: [id number] Wants to discuss meds because she notes more negative than positive thoughts and feelings.  Describing ocd symptoms with rereading simple things.  Also has recurrent compulsions and rituals.  Going back to work at school.  Doing better with that.  Sleep and mood ok but some depression.

## 2012-07-10 ENCOUNTER — Ambulatory Visit (HOSPITAL_COMMUNITY): Payer: Self-pay | Admitting: Psychology

## 2012-07-28 ENCOUNTER — Telehealth: Payer: Self-pay | Admitting: *Deleted

## 2012-07-28 ENCOUNTER — Other Ambulatory Visit: Payer: Self-pay | Admitting: *Deleted

## 2012-07-28 NOTE — Telephone Encounter (Signed)
Received call from [place], [place] in [facility] [phone]) requesting orders for phlebotomy for pt on 08/01/12.  Note given to Dr. Cyndie Chime.

## 2012-07-30 ENCOUNTER — Ambulatory Visit (INDEPENDENT_AMBULATORY_CARE_PROVIDER_SITE_OTHER): Payer: BC Managed Care – PPO | Admitting: Psychology

## 2012-07-30 DIAGNOSIS — F419 Anxiety disorder, unspecified: Secondary | ICD-10-CM

## 2012-07-30 DIAGNOSIS — F329 Major depressive disorder, single episode, unspecified: Secondary | ICD-10-CM

## 2012-07-30 DIAGNOSIS — F411 Generalized anxiety disorder: Secondary | ICD-10-CM

## 2012-07-31 ENCOUNTER — Other Ambulatory Visit: Payer: Self-pay | Admitting: Oncology

## 2012-07-31 ENCOUNTER — Telehealth: Payer: Self-pay | Admitting: *Deleted

## 2012-07-31 DIAGNOSIS — D45 Polycythemia vera: Secondary | ICD-10-CM

## 2012-07-31 NOTE — Telephone Encounter (Signed)
Received call from [facility] stating that orders are in for phlebotomy tomorrow but no parameters for amount & needs orders in computer.  Message will be left for Dr Cyndie Chime tomorrow.

## 2012-08-01 ENCOUNTER — Other Ambulatory Visit: Payer: Self-pay | Admitting: Oncology

## 2012-08-01 ENCOUNTER — Encounter (HOSPITAL_COMMUNITY): Payer: Self-pay

## 2012-08-01 ENCOUNTER — Encounter (HOSPITAL_COMMUNITY)
Admission: RE | Admit: 2012-08-01 | Discharge: 2012-08-01 | Disposition: A | Discharge status: Home / Self Care | Payer: BC Managed Care – PPO | Source: Ambulatory Visit | Attending: Oncology | Admitting: Oncology

## 2012-08-01 DIAGNOSIS — D45 Polycythemia vera: Secondary | ICD-10-CM | POA: Insufficient documentation

## 2012-08-01 NOTE — Procedures (Addendum)
Hemoglobin 13.7 Theraputic phlebotomy Via right anticubital vein with # 20 gauge cath over 18 minutes. 500cc blood obtained and pt tolerated this well

## 2012-08-04 ENCOUNTER — Encounter (HOSPITAL_COMMUNITY): Payer: Self-pay | Admitting: Psychology

## 2012-08-04 NOTE — Progress Notes (Signed)
Patient:  Cheryl Cross   DOB: 1965/12/15  MR Number: [id number]  Location: BEHAVIORAL [facility] [address] [place] [address] Dept: [phone]  Start: 11 AM End: 12 PM  Provider/Observer:     Hershal Coria PSYD  Chief Complaint:      Chief Complaint  Patient presents with  . Anxiety  . Depression    Reason For Service:     Patient was referred because of extreme anxiety and nervousness this continued after discharge from the hospital. She was hospitalized twice in the behavioral health unit and suffered from dehydration an allergic reaction to medicines. She describes major stressors right now have to do with her financial situation, family issues, coping with fibromyalgia, heightened pulse rate, and feelings of nervousness. The patient reports that she developed these current symptoms a few days after discharge from behavioral health and had to go back to the hospital due to dehydration an allergic reaction. She is currently staying with her parents and dealing with medication changes. The patient reports that she has medical conditions related to fibromyalgia, significant degenerative disc disease and Polycythemia vera.   Interventions Strategy:  Cognitive/behavioral psychotherapeutic interventions  Participation Level:   Active  Participation Quality:  Appropriate      Behavioral Observation:  Well Groomed, Alert, and Depressed.   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 The patient reports that things have been going well and that she has been going into the classroom on a regular basis to get prepared. The patient still not spinning all of her time at her residence and spending most of her time still living with her parents..  Content of  Session:   Review current symptoms and continue to work on therapeutic interventions for issues of anxiety and depression as well as coping with her overall health and medical status.  Current Status:   The patient reports that she is doing better with her medications and has been actively working on coping skills we have developed as better managing her situation. However, she continues to describe significant symptoms of depression and anxiety but they are improved.  Patient Progress:   She has been actively working on the initial coping skills we worked on and we will continue look at this as well as issues related to her medications.  Target Goals:   Target goals including addressing the issues related to her depression and anxiety in coping with her medical status.  Last Reviewed:   06/19/2012  Goals Addressed Today:    Today we worked on cognitive behavioral coping skills for issues of depression.  Impression/Diagnosis:  This point, it is clear that the patient has been through a great deal of stress with family issues and difficulties with been able to maintain her job. 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 She has experienced pain and fatigue as a result. I think that she may actually not have fibromyalgia and some of her current symptoms could be the result of potential side effects due to the Lyrica. I have suggested she contact her primary care doctor about discontinuing Lyrica as well as some of the other medications that have been  discontinued already.   Diagnosis:    Axis I:  1. Major depression   2. Anxiety         Axis II: Deferred

## 2012-08-04 NOTE — Progress Notes (Signed)
Patient:  Cheryl Cross   DOB: [birth year]  MR Number: [id number]  Location: BEHAVIORAL [facility] [address] [place] [address] Dept: [phone]  Start: 4 PM End: 5 PM  Provider/Observer:     Hershal Coria PSYD  Chief Complaint:      Chief Complaint  Patient presents with  . Anxiety  . Depression    Reason For Service:     Patient was referred because of extreme anxiety and nervousness this continued after discharge from the hospital. She was hospitalized twice in the behavioral health unit and suffered from dehydration an allergic reaction to medicines. She describes major stressors right now have to do with her financial situation, family issues, coping with fibromyalgia, heightened pulse rate, and feelings of nervousness. The patient reports that she developed these current symptoms a few days after discharge from behavioral health and had to go back to the hospital due to dehydration an allergic reaction. She is currently staying with her parents and dealing with medication changes. The patient reports that she has medical conditions related to fibromyalgia, significant degenerative disc disease and Polycythemia vera.   Interventions Strategy:  Cognitive/behavioral psychotherapeutic interventions  Participation Level:   Active  Participation Quality:  Appropriate      Behavioral Observation:  Well Groomed, Alert, and Depressed.   Current Psychosocial Factors: The patient reports that she has been quite successful returning to work and has not missed any days. She reports that this is been rather stressful for her but that overall she is doing good. The patient reports that she has also moved back into her home and while there are times when she is lonely she is doing much better than she anticipated. The patient reports that she continues to experience episodes of significant anxiety and  stress but is coping much better and overall she is improving a great deal.  Content of Session:   Review current symptoms and continue to work on therapeutic interventions for issues of anxiety and depression as well as coping with her overall health and medical status.  Current Status:   The patient has been able to return to work full-time and has been doing much better. The patient is also return to her home and is coping with the issues of living in her on home versus living with her parents better than she expected..  Patient Progress:   She has been actively working on the initial coping skills we worked on and we will continue look at this as well as issues related to her medications.  Target Goals:   Target goals including addressing the issues related to her depression and anxiety in coping with her medical status.  Last Reviewed:   07/30/2012  Goals Addressed Today:    Today we worked on cognitive behavioral coping skills for issues of depression.  Impression/Diagnosis:  This point, it is clear that the patient has been through a great deal of stress with family issues and difficulties with been able to maintain her job. 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 She has experienced pain and fatigue as a result. I think that she may actually not have fibromyalgia and some of her current symptoms could be the result of potential side effects due to the Lyrica. I have suggested she contact  her primary care doctor about discontinuing Lyrica as well as some of the other medications that have been discontinued already.   Diagnosis:    Axis I:  1. Major depression   2. Anxiety         Axis II: Deferred

## 2012-08-08 ENCOUNTER — Ambulatory Visit (HOSPITAL_COMMUNITY): Payer: Self-pay | Admitting: Psychiatry

## 2012-09-05 ENCOUNTER — Ambulatory Visit (INDEPENDENT_AMBULATORY_CARE_PROVIDER_SITE_OTHER): Payer: BC Managed Care – PPO | Admitting: Psychiatry

## 2012-09-05 ENCOUNTER — Encounter (HOSPITAL_COMMUNITY): Payer: Self-pay | Admitting: Psychiatry

## 2012-09-05 DIAGNOSIS — F429 Obsessive-compulsive disorder, unspecified: Secondary | ICD-10-CM

## 2012-09-05 NOTE — Progress Notes (Signed)
Patient ID: Cheryl Cross, female   DOB: September 28, 1966, 46 y.o.   MRN: [id number] Doing much better in some ways.  Sleeping better and functioning better.  Wants better esteem.  Still perfectionistic thinking.  Ok with dose of med.

## 2012-09-08 ENCOUNTER — Ambulatory Visit (INDEPENDENT_AMBULATORY_CARE_PROVIDER_SITE_OTHER): Payer: BC Managed Care – PPO | Admitting: Psychology

## 2012-09-08 DIAGNOSIS — F411 Generalized anxiety disorder: Secondary | ICD-10-CM

## 2012-09-08 DIAGNOSIS — F419 Anxiety disorder, unspecified: Secondary | ICD-10-CM

## 2012-09-08 DIAGNOSIS — F329 Major depressive disorder, single episode, unspecified: Secondary | ICD-10-CM

## 2012-09-10 ENCOUNTER — Encounter (HOSPITAL_COMMUNITY): Payer: Self-pay | Admitting: Psychology

## 2012-09-10 NOTE — Progress Notes (Signed)
Patient:  Cheryl Cross   DOB: 1966-10-23  MR Number: [id number]  Location: BEHAVIORAL [facility] [address] [place] [address] Dept: [phone]  Start: 4 PM End: 5 PM  Provider/Observer:     Hershal Coria PSYD  Chief Complaint:      Chief Complaint  Patient presents with  . Anxiety  . Depression    Reason For Service:     Patient was referred because of extreme anxiety and nervousness this continued after discharge from the hospital. She was hospitalized twice in the behavioral health unit and suffered from dehydration an allergic reaction to medicines. She describes major stressors right now have to do with her financial situation, family issues, coping with fibromyalgia, heightened pulse rate, and feelings of nervousness. The patient reports that she developed these current symptoms a few days after discharge from behavioral health and had to go back to the hospital due to dehydration an allergic reaction. She is currently staying with her parents and dealing with medication changes. The patient reports that she has medical conditions related to fibromyalgia, significant degenerative disc disease and Polycythemia vera.   Interventions Strategy:  Cognitive/behavioral psychotherapeutic interventions  Participation Level:   Active  Participation Quality:  Appropriate      Behavioral Observation:  Well Groomed, Alert, and Depressed.   Current Psychosocial Factors: The patient comes in today and reports that she is continued to show great progress and improvement. She is continuing to work everyday and living at her home by herself which are major improvements. The patient reports that she does continue to have difficulty with repetitive/intrusive thinking but reports that this is neither overtly negative or positive but just intrusive and repetitive types of thinking and reading things. We  worked on Pharmacologist regarding this particularly with regard to behavioral and cognitive interventions..  Content of Session:   Review current symptoms and continue to work on therapeutic interventions for issues of anxiety and depression as well as coping with her overall health and medical status.  Current Status:   The patient has been able to return to work full-time and has been doing much better. The patient is also return to her home and is coping with the issues of living in her on home versus living with her parents better than she expected..  Patient Progress:   She has been actively working on the initial coping skills we worked on and we will continue look at this as well as issues related to her medications.  Target Goals:   Target goals including addressing the issues related to her depression and anxiety in coping with her medical status.  Last Reviewed:   09/08/2012  Goals Addressed Today:    Today we worked on cognitive behavioral coping skills for issues of depression.  Impression/Diagnosis:  This point, it is clear that the patient has been through a great deal of stress with family issues and difficulties with been able to maintain her job. 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 She has experienced pain and fatigue as a result. I think that she may actually not have fibromyalgia and some of her current symptoms could be the result of potential side effects due to the Lyrica. I have suggested she contact her primary care doctor about discontinuing Lyrica as  well as some of the other medications that have been discontinued already.   Diagnosis:    Axis I:  1. Major depression   2. Anxiety         Axis II: Deferred

## 2012-10-06 ENCOUNTER — Ambulatory Visit (INDEPENDENT_AMBULATORY_CARE_PROVIDER_SITE_OTHER): Payer: BC Managed Care – PPO | Admitting: Psychology

## 2012-10-06 DIAGNOSIS — F419 Anxiety disorder, unspecified: Secondary | ICD-10-CM

## 2012-10-06 DIAGNOSIS — F411 Generalized anxiety disorder: Secondary | ICD-10-CM

## 2012-10-06 DIAGNOSIS — F329 Major depressive disorder, single episode, unspecified: Secondary | ICD-10-CM

## 2012-10-08 ENCOUNTER — Other Ambulatory Visit: Payer: Self-pay | Admitting: Family Medicine

## 2012-10-08 DIAGNOSIS — Z1231 Encounter for screening mammogram for malignant neoplasm of breast: Secondary | ICD-10-CM

## 2012-10-13 ENCOUNTER — Telehealth (HOSPITAL_COMMUNITY): Payer: Self-pay | Admitting: *Deleted

## 2012-10-23 ENCOUNTER — Encounter (HOSPITAL_COMMUNITY): Payer: Self-pay | Admitting: Psychology

## 2012-10-23 NOTE — Progress Notes (Signed)
Patient:  Cheryl Cross   DOB: Apr 11, 1966  MR Number: [id number]  Location: BEHAVIORAL [facility] [address] [place] [address] Dept: [phone]  Start: 4 PM End: 5 PM  Provider/Observer:     Hershal Coria PSYD  Chief Complaint:      Chief Complaint  Patient presents with  . Anxiety  . Depression  . Stress    Reason For Service:     Patient was referred because of extreme anxiety and nervousness this continued after discharge from the hospital. She was hospitalized twice in the behavioral health unit and suffered from dehydration an allergic reaction to medicines. She describes major stressors right now have to do with her financial situation, family issues, coping with fibromyalgia, heightened pulse rate, and feelings of nervousness. The patient reports that she developed these current symptoms a few days after discharge from behavioral health and had to go back to the hospital due to dehydration an allergic reaction. She is currently staying with her parents and dealing with medication changes. The patient reports that she has medical conditions related to fibromyalgia, significant degenerative disc disease and Polycythemia vera.   Interventions Strategy:  Cognitive/behavioral psychotherapeutic interventions  Participation Level:   Active  Participation Quality:  Appropriate      Behavioral Observation:  Well Groomed, Alert, and Depressed.   Current Psychosocial Factors: The patient reports that she is doing better and better at living [facility] and be comfortable there. She reports that there are still some difficult issues about being there and there are times when she gets stressed. However, she is dealing with these things much better than she had. She has been able to work consistently and feels like she is doing very good at her job..  Content of Session:   Review current  symptoms and continue to work on therapeutic interventions for issues of anxiety and depression as well as coping with her overall health and medical status.  Current Status:   The patient has been able to return to work full-time and has been doing much better. The patient is also return to her home and is coping with the issues of living in her on home versus living with her parents better than she expected..  Patient Progress:   She has been actively working on the initial coping skills we worked on and we will continue look at this as well as issues related to her medications.  Target Goals:   Target goals including addressing the issues related to her depression and anxiety in coping with her medical status.  Last Reviewed:   10/06/2012  Goals Addressed Today:    Today we worked on cognitive behavioral coping skills for issues of depression.  Impression/Diagnosis:  This point, it is clear that the patient has been through a great deal of stress with family issues and difficulties with been able to maintain her job. 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 She has experienced pain and fatigue as a result. I think that she may actually not have fibromyalgia and some of her current symptoms could be the result of potential side effects due to the Lyrica. I have suggested she contact her primary care doctor about discontinuing Lyrica as well as some of the other medications that have been discontinued already.  Diagnosis:    Axis I:  1. Major depression   2. Anxiety         Axis II: Deferred

## 2012-11-21 ENCOUNTER — Encounter (HOSPITAL_COMMUNITY): Payer: Self-pay | Admitting: Psychiatry

## 2012-11-21 ENCOUNTER — Ambulatory Visit (INDEPENDENT_AMBULATORY_CARE_PROVIDER_SITE_OTHER): Payer: BC Managed Care – PPO | Admitting: Psychiatry

## 2012-11-21 VITALS — Wt 149.2 lb

## 2012-11-21 DIAGNOSIS — F1994 Other psychoactive substance use, unspecified with psychoactive substance-induced mood disorder: Secondary | ICD-10-CM

## 2012-11-21 DIAGNOSIS — F332 Major depressive disorder, recurrent severe without psychotic features: Secondary | ICD-10-CM

## 2012-11-21 DIAGNOSIS — F329 Major depressive disorder, single episode, unspecified: Secondary | ICD-10-CM

## 2012-11-21 DIAGNOSIS — F1320 Sedative, hypnotic or anxiolytic dependence, uncomplicated: Secondary | ICD-10-CM

## 2012-11-21 DIAGNOSIS — F1910 Other psychoactive substance abuse, uncomplicated: Secondary | ICD-10-CM

## 2012-11-21 DIAGNOSIS — F1120 Opioid dependence, uncomplicated: Secondary | ICD-10-CM

## 2012-11-21 MED ORDER — OMEGA-3-ACID ETHYL ESTERS 1 G PO CAPS: 1.0000 g | ORAL_CAPSULE | Freq: Two times a day (BID) | ORAL | Status: DC

## 2012-11-21 MED ORDER — SERTRALINE HCL 100 MG PO TABS: 150.0000 mg | ORAL_TABLET | Freq: Every day | ORAL | Status: DC

## 2012-11-21 NOTE — Progress Notes (Signed)
[facility] MD Progress Note  Cheryl Cross  MRN:  [id number] DOB:: [birth year] Age: 47 y.o. Date: 11/21/2012  Start Time: 9:35 AM End Time: 10:10 AM  Chief Complaint: Chief Complaint  Patient presents with  . Other    OCD  . Depression  . Follow-up  . Medication Refill  . Establish Care    Subjective:  Depression 3/10 and Anxiety 4 or 510, where 1 is the best and 10 is the worst.  "I'm better, but still not all better".  Diagnosis:   Axis I: Major Depression, Recurrent severe, Substance Abuse and Substance Induced Mood Disorder Axis II: Deferred Axis III:  Past Medical History  Diagnosis Date  . Polycythemia vera 12/07/2011  . Fibromyalgia   . Polycythemia vera   . Bursitis     hips bilat  . Arthritis     back   . DDD (degenerative disc disease)   . Overactive bladder   . Depression   . PTSD (post-traumatic stress disorder)   . Anxiety   . Polycythemia vera    Axis IV: other psychosocial or environmental problems Axis V: 51-60 moderate symptoms  ADL's:  Intact  Sleep: Good, thanks to Trazodone  Appetite:  Fair  Suicidal Ideation:  Pt denies any thoughts, plans, intent of suicide\ Homicidal Ideation:  Pt denies any thoughts, plans, intent of homicide  AEB (as evidenced by):per pt report  Psychiatric Specialty Exam: ROS  Weight 149 lb 3.2 oz (67.677 kg).There is no height on file to calculate BMI.  General Appearance: Casual  Eye Contact::  Good  Speech:  Clear and Coherent  Volume:  Normal  Mood:  Anxious and Dysphoric  Affect:  Congruent  Thought Process:  Coherent, Linear and Logical  Orientation:  Full (Time, Place, and Person)  Thought Content:  WDL  Suicidal Thoughts:  No  Homicidal Thoughts:  No  Memory:  Immediate;   Good Recent;   Good Remote;   Good  Judgement:  Good  Insight:  Good  Psychomotor Activity:  TYpical  Concentration:  Good  Recall:  Good  Akathisia:  No  Handed:  Right  AIMS (if indicated):     Assets:   Communication Skills Desire for Improvement  Sleep:      Current Medications: Current Outpatient Prescriptions  Medication Sig Dispense Refill  . aspirin 81 MG tablet Take 1 tablet (81 mg total) by mouth daily. For Polycythemia Vera.      Marland Kitchen LIDODERM 5 % Place 1 patch onto the skin once a week.       . Multiple Vitamin (MULITIVITAMIN WITH MINERALS) TABS Take 1 tablet by mouth daily. Vitamin supplement.      . naproxen (NAPROSYN) 500 MG tablet Take 500 mg by mouth as needed. For Fibromyalgia/chronic pain.      Marland Kitchen omeprazole (PRILOSEC) 20 MG capsule Take 20 mg by mouth daily.       . traZODone (DESYREL) 50 MG tablet Take 1 tablet (50 mg total) by mouth at bedtime. For sleep.  30 tablet  0    Lab Results: No results found for this or any previous visit (from the past 48 hour(s)).  Physical Findings: AIMS:  , ,  ,  ,    CIWA:    COWS:     Treatment Plan Summary: Medication management  Plan: I took her vitals.  I reviewed CC, tobacco/med/surg Hx, meds effects/ side effects, problem list, therapies and responses as well as current situation/symptoms discussed options. See orders and pt  instructions for more details.  Medical Decision Making Problem Points:  Established problem, stable/improving (1), Review of last therapy session (1) and Self-limited or minor (1) Data Points:  Review of medication regiment & side effects (2) Review of new medications or change in dosage (2)  I certify that outpatient services furnished can reasonably be expected to improve the patient's condition.   Juliane Guest 11/21/2012, 9:53 AM

## 2012-11-21 NOTE — Patient Instructions (Signed)
[employer] [facility] is a good resource for over the counter pills and supplements.

## 2012-11-26 ENCOUNTER — Encounter (HOSPITAL_COMMUNITY): Payer: Self-pay | Admitting: Psychology

## 2012-11-26 ENCOUNTER — Ambulatory Visit (INDEPENDENT_AMBULATORY_CARE_PROVIDER_SITE_OTHER): Payer: BC Managed Care – PPO | Admitting: Psychology

## 2012-11-26 DIAGNOSIS — F419 Anxiety disorder, unspecified: Secondary | ICD-10-CM

## 2012-11-26 DIAGNOSIS — F411 Generalized anxiety disorder: Secondary | ICD-10-CM

## 2012-11-26 DIAGNOSIS — F329 Major depressive disorder, single episode, unspecified: Secondary | ICD-10-CM

## 2012-11-26 NOTE — Progress Notes (Signed)
Patient:  Cheryl Cross   DOB: September 12, 1966  MR Number: [id number]  Location: BEHAVIORAL [facility] [address] [place] [address] Dept: [phone]  Start: 4 PM End: 5 PM  Provider/Observer:     Hershal Coria PSYD  Chief Complaint:      Chief Complaint  Patient presents with  . Anxiety  . Depression    Reason For Service:     Patient was referred because of extreme anxiety and nervousness this continued after discharge from the hospital. She was hospitalized twice in the behavioral health unit and suffered from dehydration an allergic reaction to medicines. She describes major stressors right now have to do with her financial situation, family issues, coping with fibromyalgia, heightened pulse rate, and feelings of nervousness. The patient reports that she developed these current symptoms a few days after discharge from behavioral health and had to go back to the hospital due to dehydration an allergic reaction. She is currently staying with her parents and dealing with medication changes. The patient reports that she has medical conditions related to fibromyalgia, significant degenerative disc disease and Polycythemia vera.   Interventions Strategy:  Cognitive/behavioral psychotherapeutic interventions  Participation Level:   Active  Participation Quality:  Appropriate      Behavioral Observation:  Well Groomed, Alert, and Depressed.   Current Psychosocial Factors: The patient reports that she spent a lot of time with her parents and her sister over the holiday break as her mother had some surgery. It was during this time the things went fairly well but she began to become more concerned about her life down the road realizing that her parents were getting older and that would not be always around for her. She described the development of feelings of loneliness during this time and feel like her  medication wasn't working as well. We worked on his behavioral issues..  Content of Session:   Review current symptoms and continue to work on therapeutic interventions for issues of anxiety and depression as well as coping with her overall health and medical status.  Current Status:   The patient reports that she has been feeling more lonely and depressed recently but nothing like she had in the past. She reports that she has worked on some of the standing coping skills and has been doing better over the past week or so..  Patient Progress:   She has been actively working on the initial coping skills we worked on and we will continue look at this as well as issues related to her medications.  Target Goals:   Target goals including addressing the issues related to her depression and anxiety in coping with her medical status.  Last Reviewed:   11/26/2012  Goals Addressed Today:    Today we worked on cognitive behavioral coping skills for issues of depression.  Impression/Diagnosis:  This point, it is clear that the patient has been through a great deal of stress with family issues and difficulties with been able to maintain her job. 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 She has experienced pain and fatigue as a result. I think that she may actually not have fibromyalgia and some of her current symptoms could be the result of potential side effects due to the Lyrica. I have suggested she contact  her primary care doctor about discontinuing Lyrica as well as some of the other medications that have been discontinued already.   Diagnosis:    Axis I:  1. Major depression   2. Anxiety         Axis II: Deferred

## 2012-11-28 ENCOUNTER — Encounter (HOSPITAL_COMMUNITY): Payer: Self-pay

## 2012-11-28 ENCOUNTER — Telehealth: Payer: Self-pay | Admitting: *Deleted

## 2012-11-28 ENCOUNTER — Encounter (HOSPITAL_COMMUNITY)
Admission: RE | Admit: 2012-11-28 | Discharge: 2012-11-28 | Disposition: A | Discharge status: Home / Self Care | Payer: BC Managed Care – PPO | Source: Ambulatory Visit | Attending: Oncology | Admitting: Oncology

## 2012-11-28 DIAGNOSIS — D45 Polycythemia vera: Secondary | ICD-10-CM | POA: Insufficient documentation

## 2012-11-28 NOTE — Telephone Encounter (Signed)
Incoming call in triage from T[facility] in Short stay on Thursday at [facility],  (#[phone]) requesting orders for phlebotomy for pt on 11/28/12 @ 2pm Dr Cyndie Chime not in on this day, note given to Dr. Cyndie Chime for his return to office Friday.

## 2012-11-28 NOTE — Procedures (Signed)
Hgb results were 13.4. Phlebotomy done via right arm over 20 minutes of 500cc blood and pt tolerated this well. This report of Hbg and phlebotomy being done given to Sabino Snipes RN for Dr Cyndie Chime via phone conversation

## 2012-12-01 ENCOUNTER — Encounter (HOSPITAL_COMMUNITY): Payer: Self-pay

## 2012-12-01 ENCOUNTER — Ambulatory Visit
Admission: RE | Admit: 2012-12-01 | Discharge: 2012-12-01 | Disposition: A | Discharge status: Home / Self Care | Payer: BC Managed Care – PPO | Source: Ambulatory Visit | Attending: Family Medicine | Admitting: Family Medicine

## 2012-12-01 DIAGNOSIS — Z1231 Encounter for screening mammogram for malignant neoplasm of breast: Secondary | ICD-10-CM

## 2012-12-01 IMAGING — MG MM DIGITAL SCREENING BILAT
5 series · 5 of 5 positions shown · non-contrast
Comparison: Previous exams.

CLINICAL DATA: Screening.

DIGITAL BILATERAL SCREENING MAMMOGRAM WITH CAD

[R CC]
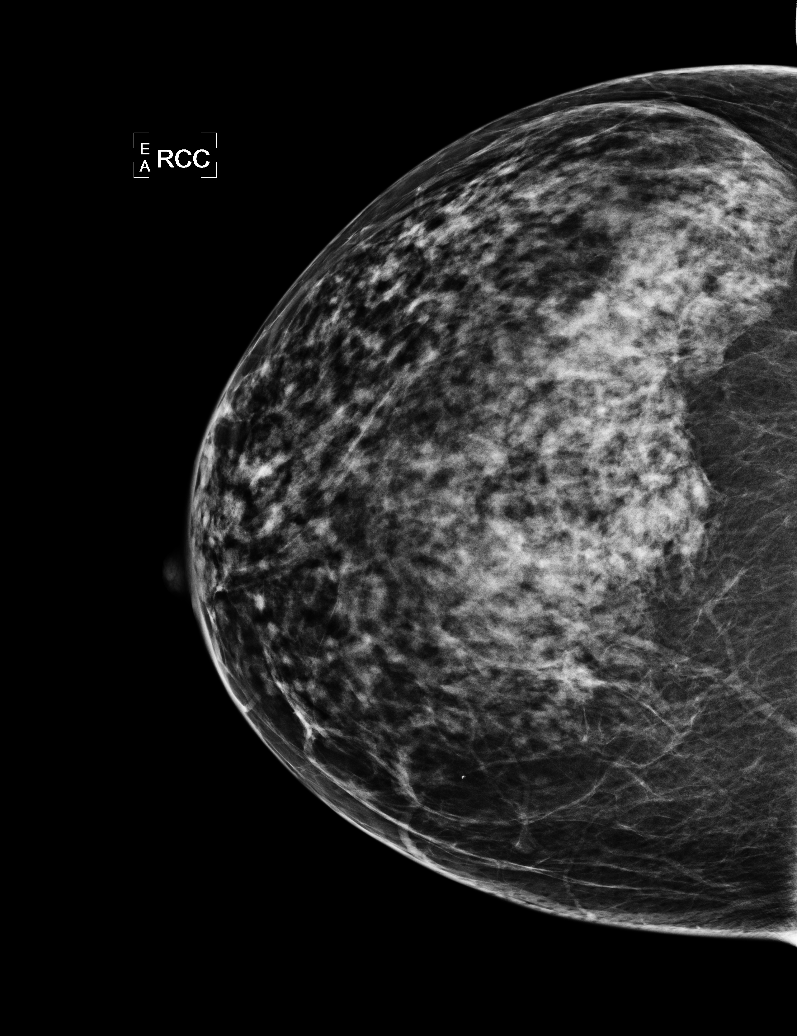

[L CC]
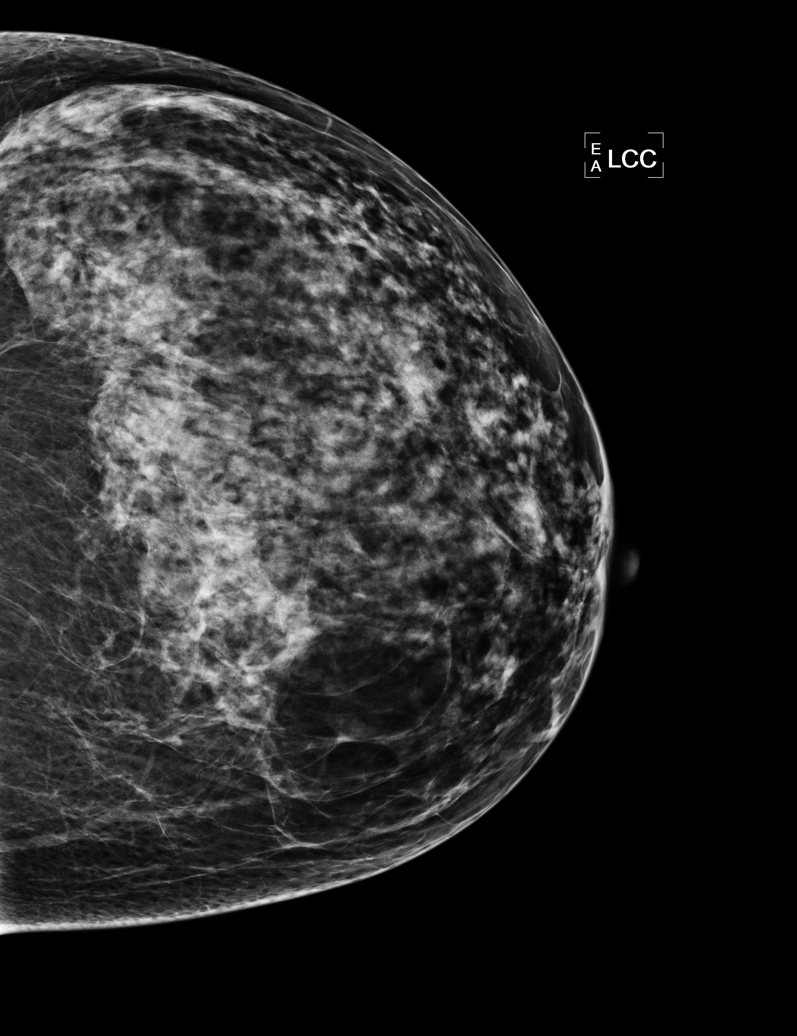

[L MLO (1 of 2)]
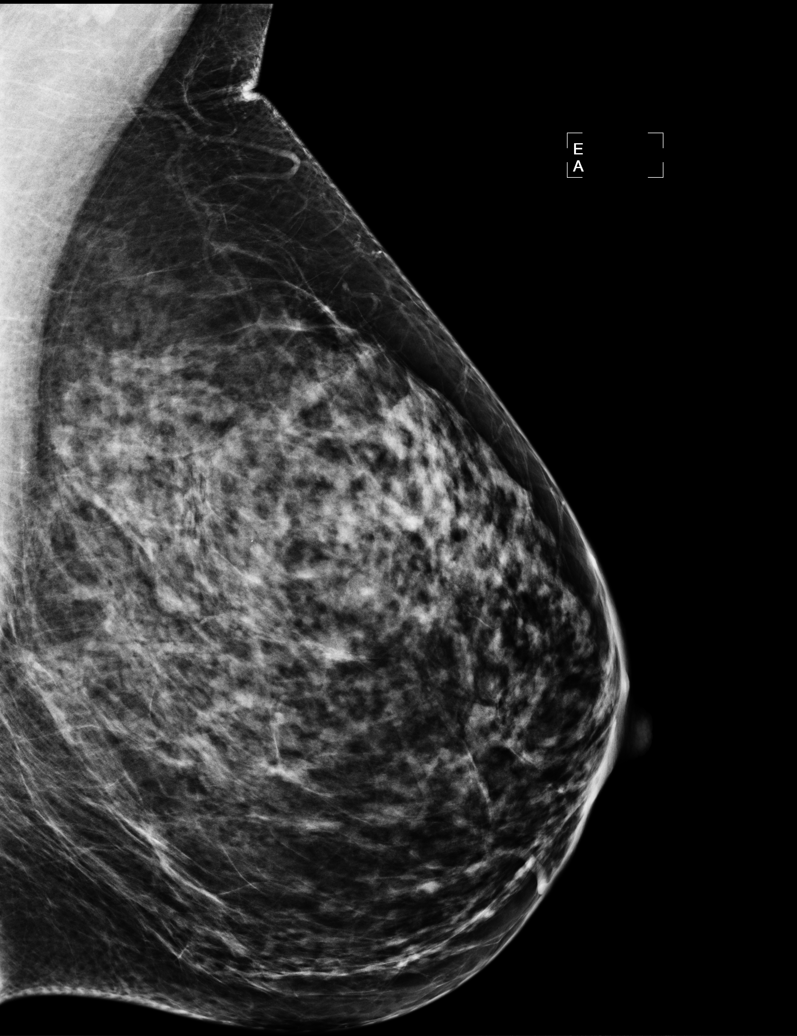

[R MLO]
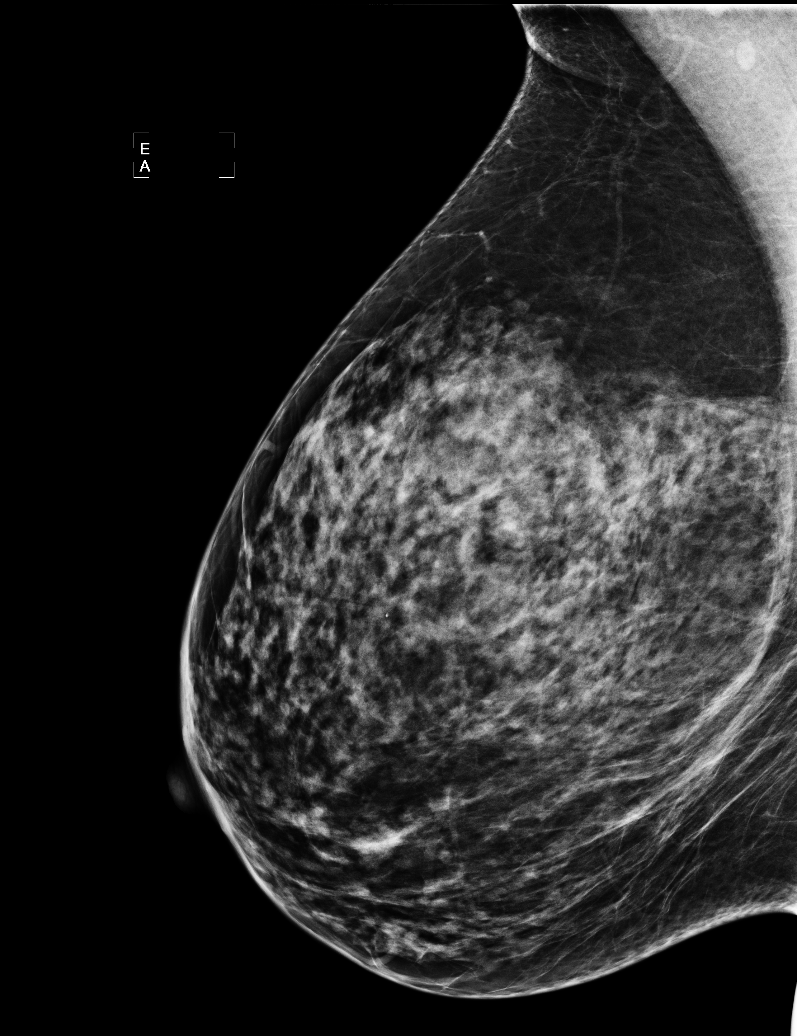

[L MLO (2 of 2)]
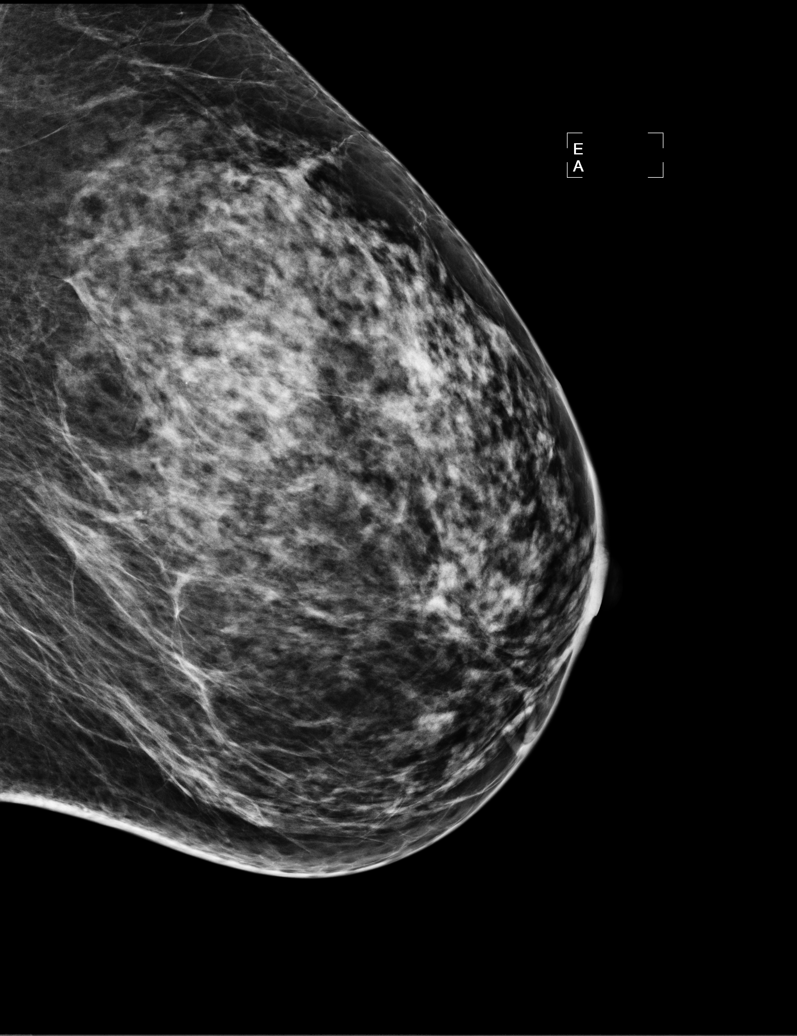

[5 of 5 positions shown; findings below may reference images not displayed]

FINDINGS: ACR Breast Density Category 3: The breast tissue is heterogeneously
dense.

No suspicious masses, architectural distortion, or calcifications
are present.

Images were processed with CAD.
IMPRESSION: No mammographic evidence of malignancy.

A result letter of this screening mammogram will be mailed directly
to the patient.

RECOMMENDATION:
Screening mammogram in one year. (Code:[BP])

BI-RADS CATEGORY 1:  Negative.

## 2012-12-05 ENCOUNTER — Encounter (HOSPITAL_COMMUNITY): Payer: Self-pay

## 2012-12-17 ENCOUNTER — Ambulatory Visit (HOSPITAL_COMMUNITY): Payer: Self-pay | Admitting: Psychiatry

## 2012-12-17 ENCOUNTER — Ambulatory Visit (INDEPENDENT_AMBULATORY_CARE_PROVIDER_SITE_OTHER): Payer: BC Managed Care – PPO | Admitting: Psychology

## 2012-12-17 DIAGNOSIS — F329 Major depressive disorder, single episode, unspecified: Secondary | ICD-10-CM

## 2012-12-17 DIAGNOSIS — F419 Anxiety disorder, unspecified: Secondary | ICD-10-CM

## 2012-12-17 DIAGNOSIS — F411 Generalized anxiety disorder: Secondary | ICD-10-CM

## 2012-12-26 ENCOUNTER — Ambulatory Visit (HOSPITAL_COMMUNITY): Payer: Self-pay | Admitting: Psychiatry

## 2013-01-09 ENCOUNTER — Other Ambulatory Visit: Payer: Self-pay | Admitting: Oncology

## 2013-01-09 ENCOUNTER — Telehealth: Payer: Self-pay | Admitting: Oncology

## 2013-01-09 NOTE — Telephone Encounter (Signed)
Gave pt appt for phlebotomy on May 2014

## 2013-01-12 ENCOUNTER — Encounter (HOSPITAL_COMMUNITY): Payer: Self-pay | Admitting: Psychology

## 2013-01-12 NOTE — Progress Notes (Signed)
Patient:  Cheryl Cross   DOB: 12/22/1965  MR Number: [id number]  Location: BEHAVIORAL [facility] [address] [place] [address] Dept: [phone]  Start: 4 PM End: 5 PM  Provider/Observer:     Hershal Coria PSYD  Chief Complaint:      Chief Complaint  Patient presents with  . Anxiety  . Depression    Reason For Service:     Patient was referred because of extreme anxiety and nervousness this continued after discharge from the hospital. She was hospitalized twice in the behavioral health unit and suffered from dehydration an allergic reaction to medicines. She describes major stressors right now have to do with her financial situation, family issues, coping with fibromyalgia, heightened pulse rate, and feelings of nervousness. The patient reports that she developed these current symptoms a few days after discharge from behavioral health and had to go back to the hospital due to dehydration an allergic reaction. She is currently staying with her parents and dealing with medication changes. The patient reports that she has medical conditions related to fibromyalgia, significant degenerative disc disease and Polycythemia vera.   Interventions Strategy:  Cognitive/behavioral psychotherapeutic interventions  Participation Level:   Active  Participation Quality:  Appropriate      Behavioral Observation:  Well Groomed, Alert, and Depressed.   Current Psychosocial Factors: The patient reports that she continues to do well at work for the most part while she has times where she begins to feel anxious and depressed these have not been as severe. She has been actively working on therapeutic interventions..  Content of Session:   Review current symptoms and continue to work on therapeutic interventions for issues of anxiety and depression as well as coping with her overall health and medical  status.  Current Status:   The patient reports that she continues to actively work on cognitive and behavioral therapeutic interventions for issues of depression. She also reports that she is continuing to increase her physical activity and paying close attention to her sleep patterns.  Patient Progress:   She has been actively working on the initial coping skills we worked on and we will continue look at this as well as issues related to her medications.  Target Goals:   Target goals including addressing the issues related to her depression and anxiety in coping with her medical status.  Last Reviewed:   12/17/2012  Goals Addressed Today:    Today we worked on cognitive behavioral coping skills for issues of depression.  Impression/Diagnosis:  This point, it is clear that the patient has been through a great deal of stress with family issues and difficulties with been able to maintain her job. 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 She has experienced pain and fatigue as a result. I think that she may actually not have fibromyalgia and some of her current symptoms could be the result of potential side effects due to the Lyrica. I have suggested she contact her primary care doctor about discontinuing Lyrica as well as some of the other medications that have been discontinued already.   Diagnosis:    Axis I:  Major depressive disorder  Anxiety      Axis II: Deferred

## 2013-01-14 ENCOUNTER — Ambulatory Visit (INDEPENDENT_AMBULATORY_CARE_PROVIDER_SITE_OTHER): Payer: BC Managed Care – PPO | Admitting: Psychology

## 2013-01-15 ENCOUNTER — Encounter (HOSPITAL_COMMUNITY): Payer: Self-pay | Admitting: Psychology

## 2013-01-15 NOTE — Progress Notes (Signed)
Patient:  Cheryl Cross   DOB: [birth year]  MR Number: [id number]  Location: BEHAVIORAL [facility] [address] [place] [address] Dept: [phone]  Start: 4 PM End: 5 PM  Provider/Observer:     Hershal Coria PSYD  Chief Complaint:      Chief Complaint  Patient presents with  . Anxiety  . Depression  . Stress    Reason For Service:     Patient was referred because of extreme anxiety and nervousness this continued after discharge from the hospital. She was hospitalized twice in the behavioral health unit and suffered from dehydration an allergic reaction to medicines. She describes major stressors right now have to do with her financial situation, family issues, coping with fibromyalgia, heightened pulse rate, and feelings of nervousness. The patient reports that she developed these current symptoms a few days after discharge from behavioral health and had to go back to the hospital due to dehydration an allergic reaction. She is currently staying with her parents and dealing with medication changes. The patient reports that she has medical conditions related to fibromyalgia, significant degenerative disc disease and Polycythemia vera.   Interventions Strategy:  Cognitive/behavioral psychotherapeutic interventions  Participation Level:   Active  Participation Quality:  Appropriate      Behavioral Observation:  Well Groomed, Alert, and Depressed.   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 The patient has had some struggles with regard to figure out whether this is something she wants to do with it is not an area of her training her expertise at this point. We worked on this issue to help deal with cope with some of the issues  around.  Content of Session:   Review current symptoms and continue to work on therapeutic interventions for issues of anxiety and depression as well as coping with her overall health and medical status.  Current Status:   The patient reports that she her symptoms of depression and anxiety have continued to be quite well managed. She is doing well her medications and has been quite effective throughout the school year..  Patient Progress:   She has been actively working on the initial coping skills we worked on and we will continue look at this as well as issues related to her medications.  Target Goals:   Target goals including addressing the issues related to her depression and anxiety in coping with her medical status.  Last Reviewed:   01/15/2013  Goals Addressed Today:    Today we worked on cognitive behavioral coping skills for issues of depression.  Impression/Diagnosis:  This point, it is clear that the patient has been through a great deal of stress with family issues and difficulties with been able to maintain her job. 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 She has experienced pain and fatigue as a result. I think that she may actually not have fibromyalgia and some of her current symptoms could be the result of potential side effects due to the Lyrica. I have suggested she contact her primary care doctor about discontinuing Lyrica as well as some of the other medications  that have been discontinued already.   Diagnosis:    Axis I:  Major depressive disorder  Anxiety      Axis II: Deferred

## 2013-01-16 ENCOUNTER — Ambulatory Visit (HOSPITAL_COMMUNITY): Payer: Self-pay | Admitting: Psychiatry

## 2013-02-25 ENCOUNTER — Ambulatory Visit (INDEPENDENT_AMBULATORY_CARE_PROVIDER_SITE_OTHER): Payer: BC Managed Care – PPO | Admitting: Psychiatry

## 2013-02-25 ENCOUNTER — Encounter (HOSPITAL_COMMUNITY): Payer: Self-pay | Admitting: Psychiatry

## 2013-02-25 VITALS — Wt 149.8 lb

## 2013-02-25 DIAGNOSIS — F329 Major depressive disorder, single episode, unspecified: Secondary | ICD-10-CM

## 2013-02-25 DIAGNOSIS — F1994 Other psychoactive substance use, unspecified with psychoactive substance-induced mood disorder: Secondary | ICD-10-CM

## 2013-02-25 DIAGNOSIS — F1120 Opioid dependence, uncomplicated: Secondary | ICD-10-CM

## 2013-02-25 DIAGNOSIS — F1320 Sedative, hypnotic or anxiolytic dependence, uncomplicated: Secondary | ICD-10-CM

## 2013-02-25 DIAGNOSIS — F5105 Insomnia due to other mental disorder: Secondary | ICD-10-CM

## 2013-02-25 DIAGNOSIS — F429 Obsessive-compulsive disorder, unspecified: Secondary | ICD-10-CM | POA: Insufficient documentation

## 2013-02-25 DIAGNOSIS — F332 Major depressive disorder, recurrent severe without psychotic features: Secondary | ICD-10-CM

## 2013-02-25 DIAGNOSIS — F1910 Other psychoactive substance abuse, uncomplicated: Secondary | ICD-10-CM

## 2013-02-25 DIAGNOSIS — F418 Other specified anxiety disorders: Secondary | ICD-10-CM | POA: Insufficient documentation

## 2013-02-25 MED ORDER — SERTRALINE HCL 100 MG PO TABS: 150.0000 mg | ORAL_TABLET | Freq: Every day | ORAL | Status: DC

## 2013-02-25 MED ORDER — TRAZODONE HCL 50 MG PO TABS: 50.0000 mg | ORAL_TABLET | Freq: Every day | ORAL | Status: DC

## 2013-02-25 NOTE — Patient Instructions (Signed)
Strongly consider attending at least 6 [employer] Meetings to help you learn about how your helping others to the exclusion of helping yourself is actually hurting yourself and is actually an addiction to fixing others and that you need to work the 12 Step to Happiness through the [employer]. Al-Anon Family Groups could be helpful with how to deal with substance abusing family and friends. Or your own issues of being in victim role.  There are only 40 Alanon Family Group meetings a week here in [place].  Online are current listing of those meetings @ [email or web address]  There are [employer].  Search on line and there you can learn the format and can access the schedule for yourself.  Their number is [phone]  Google [facility] [place] to find meetings.  Take care of yourself.  No one else is standing up to do the job and only you know what you need.   GET SERIOUS about taking care of yourself.  Do the next right thing and that often means doing something to care for yourself along the lines of are you hungry, are you angry, are you lonely, are you tired, are you scared?  [employer] is what that stands for.  Call if problems or concerns.

## 2013-02-25 NOTE — Progress Notes (Signed)
[facility] MD Progress Note [id number] Cheryl Cross  MRN:  [id number] DOB:: 07-22-1966 Age: 47 y.o. Date: 02/25/2013  Start Time: 2:25 PM End Time: 2:45 PM  Chief Complaint: Chief Complaint  Patient presents with  . Anxiety  . Depression  . Follow-up  . Medication Refill   Subjective:  Depression 3/10 and Anxiety 3/10, where 1 is the best and 10 is the worst. Pain is 0/10 "I'm better weith the increase in Zoloft".  Discussed her supporitng her sobriety with 12 Step meetings.  Diagnosis:   Axis I: Major Depression, Recurrent severe, Substance Abuse and Substance Induced Mood Disorder Axis II: Deferred Axis III:  Past Medical History  Diagnosis Date  . Polycythemia vera 12/07/2011  . Fibromyalgia   . Polycythemia vera   . Bursitis     hips bilat  . Arthritis     back   . DDD (degenerative disc disease)   . Overactive bladder   . Depression   . PTSD (post-traumatic stress disorder)   . Anxiety   . Polycythemia vera   . Obsessive-compulsive disorder    Axis IV: other psychosocial or environmental problems Axis V: 51-60 moderate symptoms  ADL's:  Intact  Sleep: Good, thanks to Trazodone  Appetite:  Fair  Suicidal Ideation:  Pt denies any thoughts, plans, intent of suicide\ Homicidal Ideation:  Pt denies any thoughts, plans, intent of homicide  AEB (as evidenced by):per pt report  Psychiatric Specialty Exam: ROS  Weight 149 lb 12.8 oz (67.949 kg).Body mass index is 23.46 kg/(m^2).  General Appearance: Casual  Eye Contact::  Good  Speech:  Clear and Coherent  Volume:  Normal  Mood:  Anxious and Dysphoric  Affect:  Congruent  Thought Process:  Coherent, Linear and Logical  Orientation:  Full (Time, Place, and Person)  Thought Content:  WDL  Suicidal Thoughts:  No  Homicidal Thoughts:  No  Memory:  Immediate;   Good Recent;   Good Remote;   Good  Judgement:  Good  Insight:  Good  Psychomotor Activity:  TYpical  Concentration:  Good  Recall:  Good   Akathisia:  No  Handed:  Right  AIMS (if indicated):     Assets:  Communication Skills Desire for Improvement  Sleep:      Current Medications: Current Outpatient Prescriptions  Medication Sig Dispense Refill  . LIDODERM 5 % Place 1 patch onto the skin once a week.       . sertraline (ZOLOFT) 100 MG tablet Take 1.5 tablets (150 mg total) by mouth daily.  45 tablet  2  . traZODone (DESYREL) 50 MG tablet Take 1 tablet (50 mg total) by mouth at bedtime. For sleep.  30 tablet  0  . aspirin 81 MG tablet Take 1 tablet (81 mg total) by mouth daily. For Polycythemia Vera.      . Multiple Vitamin (MULITIVITAMIN WITH MINERALS) TABS Take 1 tablet by mouth daily. Vitamin supplement.      . naproxen (NAPROSYN) 500 MG tablet Take 500 mg by mouth as needed. For Fibromyalgia/chronic pain.      Marland Kitchen omega-3 acid ethyl esters (LOVAZA) 1 G capsule Take 1 capsule (1 g total) by mouth 2 (two) times daily.  60 capsule  1  . omeprazole (PRILOSEC) 20 MG capsule Take 20 mg by mouth daily.        No current facility-administered medications for this visit.   Lab Results:  Results for orders placed during the hospital encounter of 11/28/12 (from the past 8736  hour(s))  HEMOGLOBIN   Collection Time    11/28/12  2:00 PM      Result Value Range   Hemoglobin 13.4  12.0 - 15.0 g/dL  Results for orders placed during the hospital encounter of 08/01/12 (from the past 8736 hour(s))  HEMOGLOBIN   Collection Time    08/01/12  2:05 PM      Result Value Range   Hemoglobin 13.7  12.0 - 15.0 g/dL  Results for orders placed in visit on 06/06/12 (from the past 8736 hour(s))  CBC & DIFF AND RETIC   Collection Time    06/06/12 11:06 AM      Result Value Range   WBC 6.1  3.9 - 10.3 10e3/uL   NEUT# 3.7  1.5 - 6.5 10e3/uL   HGB 14.0  11.6 - 15.9 g/dL   HCT 45.4  09.8 - 11.9 %   Platelets 355  145 - 400 10e3/uL   MCV 89.5  79.5 - 101.0 fL   MCH 30.7  25.1 - 34.0 pg   MCHC 34.3  31.5 - 36.0 g/dL   RBC 1.47  8.29 - 5.62  10e6/uL   RDW 13.8  11.2 - 14.5 %   lymph# 1.9  0.9 - 3.3 10e3/uL   MONO# 0.4  0.1 - 0.9 10e3/uL   Eosinophils Absolute 0.1  0.0 - 0.5 10e3/uL   Basophils Absolute 0.0  0.0 - 0.1 10e3/uL   NEUT% 60.6  38.4 - 76.8 %   LYMPH% 30.3  14.0 - 49.7 %   MONO% 7.0  0.0 - 14.0 %   EOS% 1.8  0.0 - 7.0 %   BASO% 0.3  0.0 - 2.0 %   Retic % 0.87  0.70 - 2.10 %   Retic Ct Abs 39.67  33.70 - 90.70 10e3/uL   Immature Retic Fract 3.20  1.60 - 10.00 %  FERRITIN   Collection Time    06/06/12 11:06 AM      Result Value Range   Ferritin 6 (*) 10 - 291 ng/mL  COMPREHENSIVE METABOLIC PANEL   Collection Time    06/06/12 11:06 AM      Result Value Range   Sodium 139  135 - 145 mEq/L   Potassium 4.5  3.5 - 5.3 mEq/L   Chloride 106  96 - 112 mEq/L   CO2 26  19 - 32 mEq/L   Glucose, Bld 79  70 - 99 mg/dL   BUN 8  6 - 23 mg/dL   Creatinine, Ser 1.30  0.50 - 1.10 mg/dL   Total Bilirubin 0.3  0.3 - 1.2 mg/dL   Alkaline Phosphatase 52  39 - 117 U/L   AST 8  0 - 37 U/L   ALT <8  0 - 35 U/L   Total Protein 6.3  6.0 - 8.3 g/dL   Albumin 4.2  3.5 - 5.2 g/dL   Calcium 9.5  8.4 - 86.5 mg/dL  LACTATE DEHYDROGENASE   Collection Time    06/06/12 11:06 AM      Result Value Range   LDH 96  94 - 250 U/L  IRON AND TIBC   Collection Time    06/06/12 11:06 AM      Result Value Range   Iron 81  42 - 145 ug/dL   UIBC 784  696 - 295 ug/dL   TIBC 284  132 - 440 ug/dL   %SAT 25  20 - 55 %  Results for orders placed during the hospital encounter  of 04/11/12 (from the past 8736 hour(s))  HEMOGLOBIN AND HEMATOCRIT, BLOOD   Collection Time    04/11/12  2:00 PM      Result Value Range   Hemoglobin 12.9  12.0 - 15.0 g/dL   HCT 40.9  81.1 - 91.4 %  Results for orders placed during the hospital encounter of 03/20/12 (from the past 8736 hour(s))  CBC   Collection Time    03/21/12  2:10 AM      Result Value Range   WBC 8.1  4.0 - 10.5 K/uL   RBC 4.59  3.87 - 5.11 MIL/uL   Hemoglobin 13.9  12.0 - 15.0 g/dL   HCT  78.2  95.6 - 21.3 %   MCV 89.1  78.0 - 100.0 fL   MCH 30.3  26.0 - 34.0 pg   MCHC 34.0  30.0 - 36.0 g/dL   RDW 08.6  57.8 - 46.9 %   Platelets 349  150 - 400 K/uL  DIFFERENTIAL   Collection Time    03/21/12  2:10 AM      Result Value Range   Neutrophils Relative 76  43 - 77 %   Neutro Abs 6.1  1.7 - 7.7 K/uL   Lymphocytes Relative 16  12 - 46 %   Lymphs Abs 1.3  0.7 - 4.0 K/uL   Monocytes Relative 9  3 - 12 %   Monocytes Absolute 0.7  0.1 - 1.0 K/uL   Eosinophils Relative 0  0 - 5 %   Eosinophils Absolute 0.0  0.0 - 0.7 K/uL   Basophils Relative 0  0 - 1 %   Basophils Absolute 0.0  0.0 - 0.1 K/uL  COMPREHENSIVE METABOLIC PANEL   Collection Time    03/21/12  2:10 AM      Result Value Range   Sodium 137  135 - 145 mEq/L   Potassium 3.6  3.5 - 5.1 mEq/L   Chloride 104  96 - 112 mEq/L   CO2 23  19 - 32 mEq/L   Glucose, Bld 112 (*) 70 - 99 mg/dL   BUN 3 (*) 6 - 23 mg/dL   Creatinine, Ser 6.29  0.50 - 1.10 mg/dL   Calcium 9.2  8.4 - 52.8 mg/dL   Total Protein 7.3  6.0 - 8.3 g/dL   Albumin 4.3  3.5 - 5.2 g/dL   AST 11  0 - 37 U/L   ALT 10  0 - 35 U/L   Alkaline Phosphatase 63  39 - 117 U/L   Total Bilirubin 0.2 (*) 0.3 - 1.2 mg/dL   GFR calc non Af Amer >90  >90 mL/min   GFR calc Af Amer >90  >90 mL/min  Results for orders placed during the hospital encounter of 03/19/12 (from the past 8736 hour(s))  CK TOTAL AND CKMB   Collection Time    03/19/12 10:44 PM      Result Value Range   Total CK 17  7 - 177 U/L   CK, MB 1.1  0.3 - 4.0 ng/mL   Relative Index RELATIVE INDEX IS INVALID  0.0 - 2.5  POCT I-STAT, CHEM 8   Collection Time    03/19/12 10:50 PM      Result Value Range   Sodium 141  135 - 145 mEq/L   Potassium 3.7  3.5 - 5.1 mEq/L   Chloride 106  96 - 112 mEq/L   BUN <3 (*) 6 - 23 mg/dL   Creatinine, Ser 4.13  0.50 - 1.10 mg/dL   Glucose, Bld 99  70 - 99 mg/dL   Calcium, Ion 1.61  1.12 - 1.32 mmol/L   TCO2 24  0 - 100 mmol/L   Hemoglobin 15.0  12.0 - 15.0 g/dL    HCT 09.6  04.5 - 40.9 %  Results for orders placed during the hospital encounter of 03/10/12 (from the past 8736 hour(s))  T3, FREE   Collection Time    03/11/12  6:25 AM      Result Value Range   T3, Free 3.1  2.3 - 4.2 pg/mL  T4, FREE   Collection Time    03/11/12  6:25 AM      Result Value Range   Free T4 0.97  0.80 - 1.80 ng/dL  TSH   Collection Time    03/11/12  6:25 AM      Result Value Range   TSH 0.896  0.350 - 4.500 uIU/mL  URINE RAPID DRUG SCREEN (HOSP PERFORMED)   Collection Time    03/10/12  9:38 AM      Result Value Range   Opiates POSITIVE (*) NONE DETECTED   Cocaine NONE DETECTED  NONE DETECTED   Benzodiazepines POSITIVE (*) NONE DETECTED   Amphetamines NONE DETECTED  NONE DETECTED   Tetrahydrocannabinol NONE DETECTED  NONE DETECTED   Barbiturates NONE DETECTED  NONE DETECTED  CBC   Collection Time    03/10/12 10:02 AM      Result Value Range   WBC 8.6  4.0 - 10.5 K/uL   RBC 4.05  3.87 - 5.11 MIL/uL   Hemoglobin 11.8 (*) 12.0 - 15.0 g/dL   HCT 81.1  91.4 - 78.2 %   MCV 92.3  78.0 - 100.0 fL   MCH 29.1  26.0 - 34.0 pg   MCHC 31.6  30.0 - 36.0 g/dL   RDW 95.6  21.3 - 08.6 %   Platelets 285  150 - 400 K/uL  DIFFERENTIAL   Collection Time    03/10/12 10:02 AM      Result Value Range   Neutrophils Relative 57  43 - 77 %   Neutro Abs 4.9  1.7 - 7.7 K/uL   Lymphocytes Relative 32  12 - 46 %   Lymphs Abs 2.8  0.7 - 4.0 K/uL   Monocytes Relative 8  3 - 12 %   Monocytes Absolute 0.7  0.1 - 1.0 K/uL   Eosinophils Relative 2  0 - 5 %   Eosinophils Absolute 0.2  0.0 - 0.7 K/uL   Basophils Relative 0  0 - 1 %   Basophils Absolute 0.0  0.0 - 0.1 K/uL  COMPREHENSIVE METABOLIC PANEL   Collection Time    03/10/12 10:02 AM      Result Value Range   Sodium 139  135 - 145 mEq/L   Potassium 3.8  3.5 - 5.1 mEq/L   Chloride 103  96 - 112 mEq/L   CO2 27  19 - 32 mEq/L   Glucose, Bld 108 (*) 70 - 99 mg/dL   BUN 4 (*) 6 - 23 mg/dL   Creatinine, Ser 5.78  0.50 -  1.10 mg/dL   Calcium 46.9  8.4 - 62.9 mg/dL   Total Protein 6.4  6.0 - 8.3 g/dL   Albumin 3.6  3.5 - 5.2 g/dL   AST 11  0 - 37 U/L   ALT 8  0 - 35 U/L   Alkaline Phosphatase 61  39 - 117 U/L  Total Bilirubin 0.2 (*) 0.3 - 1.2 mg/dL   GFR calc non Af Amer >90  >90 mL/min   GFR calc Af Amer >90  >90 mL/min  ETHANOL   Collection Time    03/10/12 10:02 AM      Result Value Range   Alcohol, Ethyl (B) <11  0 - 11 mg/dL  CARDIAC PANEL(CRET KIN+CKTOT+MB+TROPI)   Collection Time    03/10/12 10:02 AM      Result Value Range   Total CK 23  7 - 177 U/L   CK, MB 1.4  0.3 - 4.0 ng/mL   Troponin I <0.30  <0.30 ng/mL   Relative Index RELATIVE INDEX IS INVALID  0.0 - 2.5   Physical Findings: AIMS:  , ,  ,  ,    CIWA:    COWS:     Treatment Plan Summary: Medication management  Plan: I took her vitals.  I reviewed CC, tobacco/med/surg Hx, meds effects/ side effects, problem list, therapies and responses as well as current situation/symptoms discussed options. Continue current effective medications. See orders and pt instructions for more details.  MEDICATIONS this encounter: Meds ordered this encounter  Medications  . traZODone (DESYREL) 50 MG tablet    Sig: Take 1 tablet (50 mg total) by mouth at bedtime. For sleep.    Dispense:  30 tablet    Refill:  2  . sertraline (ZOLOFT) 100 MG tablet    Sig: Take 1.5 tablets (150 mg total) by mouth daily.    Dispense:  45 tablet    Refill:  2    Medical Decision Making Problem Points:  Established problem, stable/improving (1), Review of last therapy session (1) and Self-limited or minor (1) Data Points:  Review or order clinical lab tests (1) Review of medication regiment & side effects (2)  I certify that outpatient services furnished can reasonably be expected to improve the patient's condition.   Margy Sumler 02/25/2013, 2:32 PM

## 2013-03-11 ENCOUNTER — Ambulatory Visit (INDEPENDENT_AMBULATORY_CARE_PROVIDER_SITE_OTHER): Payer: BC Managed Care – PPO | Admitting: Psychology

## 2013-03-11 DIAGNOSIS — F329 Major depressive disorder, single episode, unspecified: Secondary | ICD-10-CM

## 2013-03-12 ENCOUNTER — Encounter (HOSPITAL_COMMUNITY): Payer: Self-pay | Admitting: Psychology

## 2013-03-12 ENCOUNTER — Other Ambulatory Visit: Payer: Self-pay | Admitting: Oncology

## 2013-03-12 DIAGNOSIS — D45 Polycythemia vera: Secondary | ICD-10-CM

## 2013-03-12 NOTE — Progress Notes (Signed)
Patient:  Cheryl Cross   DOB: June 27, 1966  MR Number: [id number]  Location: BEHAVIORAL [facility] [address] [place] [address] Dept: [phone]  Start: 4 PM End: 5 PM  Provider/Observer:     Hershal Coria PSYD  Chief Complaint:      Chief Complaint  Patient presents with  . Depression    Reason For Service:     Patient was referred because of extreme anxiety and nervousness this continued after discharge from the hospital. She was hospitalized twice in the behavioral health unit and suffered from dehydration an allergic reaction to medicines. She describes major stressors right now have to do with her financial situation, family issues, coping with fibromyalgia, heightened pulse rate, and feelings of nervousness. The patient reports that she developed these current symptoms a few days after discharge from behavioral health and had to go back to the hospital due to dehydration an allergic reaction. She is currently staying with her parents and dealing with medication changes. The patient reports that she has medical conditions related to fibromyalgia, significant degenerative disc disease and Polycythemia vera.   Interventions Strategy:  Cognitive/behavioral psychotherapeutic interventions  Participation Level:   Active  Participation Quality:  Appropriate      Behavioral Observation:  Well Groomed, Alert, and Depressed.   Current Psychosocial Factors: The patient reports that she continues to do quite well and realizes is been now one year since she was hospitalized. She is able to identify significant improvement in both her coping skills as well as the resulting mood stability and effectiveness with regard to efficient work performance.  Content of Session:   Review current symptoms and continue to work on therapeutic interventions for issues of anxiety and depression as well as coping  with her overall health and medical status.  Current Status:   The patient reports that she continues with her significant improvement in symptoms of depression. She reports that she is able to identify times where she developed some mild depressive symptoms and immediately works on the coping skills we have developed.  Patient Progress:   She has been actively working on the initial coping skills we worked on and we will continue look at this as well as issues related to her medications.  Target Goals:   Target goals including addressing the issues related to her depression and anxiety in coping with her medical status.  Last Reviewed:   03/11/2013  Goals Addressed Today:    Today we worked on cognitive behavioral coping skills for issues of depression.  Impression/Diagnosis:  This point, it is clear that the patient has been through a great deal of stress with family issues and difficulties with been able to maintain her job. 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 She has experienced pain and fatigue as a result. I think that she may actually not have fibromyalgia and some of her current symptoms could be the result of potential side effects due to the Lyrica. I have suggested she contact her primary care doctor about discontinuing Lyrica as well as some of the other medications that have been discontinued already.   Diagnosis:    Axis I:  Major depressive disorder      Axis II: Deferred

## 2013-03-27 ENCOUNTER — Encounter (HOSPITAL_COMMUNITY): Payer: Self-pay

## 2013-03-27 ENCOUNTER — Encounter (HOSPITAL_COMMUNITY)
Admission: RE | Admit: 2013-03-27 | Discharge: 2013-03-27 | Disposition: A | Discharge status: Home / Self Care | Payer: BC Managed Care – PPO | Source: Ambulatory Visit | Attending: Oncology | Admitting: Oncology

## 2013-03-27 VITALS — BP 123/80 | HR 74 | Temp 98.6°F | Resp 16

## 2013-03-27 DIAGNOSIS — D45 Polycythemia vera: Secondary | ICD-10-CM

## 2013-03-27 LAB — CBC
HCT: 39.9 % (ref 36.0–46.0)
MCV: 88.7 fL (ref 78.0–100.0)
Platelets: 333 10*3/uL (ref 150–400)
RBC: 4.5 MIL/uL (ref 3.87–5.11)
WBC: 7.1 10*3/uL (ref 4.0–10.5)

## 2013-03-27 LAB — DIFFERENTIAL
Eosinophils Relative: 1 % (ref 0–5)
Lymphocytes Relative: 31 % (ref 12–46)
Lymphs Abs: 2.2 10*3/uL (ref 0.7–4.0)

## 2013-03-27 MED ORDER — SODIUM CHLORIDE 0.9 % IJ SOLN
10.0000 mL | INTRAMUSCULAR | Status: DC | PRN
  Administered 2013-03-27: 10 mL via INTRAVENOUS

## 2013-03-27 NOTE — Procedures (Signed)
Phlebotomy performed of 500gm over 45 min . Tolerated well. Denies dizziness at  DC. VSS

## 2013-05-07 ENCOUNTER — Ambulatory Visit (INDEPENDENT_AMBULATORY_CARE_PROVIDER_SITE_OTHER): Payer: BC Managed Care – PPO | Admitting: Psychology

## 2013-05-07 DIAGNOSIS — F329 Major depressive disorder, single episode, unspecified: Secondary | ICD-10-CM

## 2013-05-11 ENCOUNTER — Ambulatory Visit (HOSPITAL_COMMUNITY): Payer: Self-pay | Admitting: Psychology

## 2013-05-21 ENCOUNTER — Ambulatory Visit (HOSPITAL_COMMUNITY): Payer: Self-pay | Admitting: Psychiatry

## 2013-05-27 ENCOUNTER — Ambulatory Visit (HOSPITAL_COMMUNITY): Payer: Self-pay | Admitting: Psychiatry

## 2013-05-27 ENCOUNTER — Encounter (HOSPITAL_COMMUNITY): Payer: Self-pay | Admitting: Psychology

## 2013-05-27 NOTE — Progress Notes (Signed)
Patient:  Cheryl Cross   DOB: 1966-09-05  MR Number: [id number]  Location: BEHAVIORAL [facility] [address] [place] [address] Dept: [phone]  Start: 4 PM End: 5 PM  Provider/Observer:     Hershal Coria PSYD  Chief Complaint:      Chief Complaint  Patient presents with  . Depression    Reason For Service:     Patient was referred because of extreme anxiety and nervousness this continued after discharge from the hospital. She was hospitalized twice in the behavioral health unit and suffered from dehydration an allergic reaction to medicines. She describes major stressors right now have to do with her financial situation, family issues, coping with fibromyalgia, heightened pulse rate, and feelings of nervousness. The patient reports that she developed these current symptoms a few days after discharge from behavioral health and had to go back to the hospital due to dehydration an allergic reaction. She is currently staying with her parents and dealing with medication changes. The patient reports that she has medical conditions related to fibromyalgia, significant degenerative disc disease and Polycythemia vera.   Interventions Strategy:  Cognitive/behavioral psychotherapeutic interventions  Participation Level:   Active  Participation Quality:  Appropriate      Behavioral Observation:  Well Groomed, Alert, and Depressed.   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  Content of Session:   Review current symptoms and continue to work on therapeutic interventions for issues of anxiety and depression as well as coping with her overall health and medical status.  Current Status:   The patient reports that with the exception of some mild  depressive faces the patient is been doing very well without any major depressive events. The patient reports that she continues to actively work on her coping skills and strategies around recurrent depression.  Patient Progress:   She has been actively working on the initial coping skills we worked on and we will continue look at this as well as issues related to her medications.  Target Goals:   Target goals including addressing the issues related to her depression and anxiety in coping with her medical status.  Last Reviewed:   05/07/2013  Goals Addressed Today:    Today we worked on cognitive behavioral coping skills for issues of depression.  Impression/Diagnosis:  This point, it is clear that the patient has been through a great deal of stress with family issues and difficulties with been able to maintain her job. 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 She has experienced pain and fatigue as a result. I think that she may actually not have fibromyalgia and some of her current symptoms could be the result of potential side effects due to the Lyrica. I have suggested she contact her primary care doctor about discontinuing Lyrica as well as some of the other medications that have been discontinued already.   Diagnosis:    Axis I:  Major depressive disorder      Axis II: Deferred

## 2013-06-04 ENCOUNTER — Ambulatory Visit (HOSPITAL_COMMUNITY): Payer: Self-pay | Admitting: Psychiatry

## 2013-06-04 ENCOUNTER — Encounter (HOSPITAL_COMMUNITY): Payer: Self-pay | Admitting: Psychiatry

## 2013-06-04 ENCOUNTER — Ambulatory Visit (INDEPENDENT_AMBULATORY_CARE_PROVIDER_SITE_OTHER): Payer: BC Managed Care – PPO | Admitting: Psychiatry

## 2013-06-04 VITALS — Wt 148.0 lb

## 2013-06-04 DIAGNOSIS — F418 Other specified anxiety disorders: Secondary | ICD-10-CM

## 2013-06-04 DIAGNOSIS — F1910 Other psychoactive substance abuse, uncomplicated: Secondary | ICD-10-CM

## 2013-06-04 DIAGNOSIS — F1994 Other psychoactive substance use, unspecified with psychoactive substance-induced mood disorder: Secondary | ICD-10-CM

## 2013-06-04 DIAGNOSIS — F332 Major depressive disorder, recurrent severe without psychotic features: Secondary | ICD-10-CM

## 2013-06-04 DIAGNOSIS — F329 Major depressive disorder, single episode, unspecified: Secondary | ICD-10-CM

## 2013-06-04 DIAGNOSIS — F429 Obsessive-compulsive disorder, unspecified: Secondary | ICD-10-CM

## 2013-06-04 MED ORDER — SERTRALINE HCL 100 MG PO TABS: 150.0000 mg | ORAL_TABLET | Freq: Every day | ORAL | Status: DC

## 2013-06-04 MED ORDER — TRAZODONE HCL 50 MG PO TABS: 50.0000 mg | ORAL_TABLET | Freq: Every day | ORAL | Status: DC

## 2013-06-04 NOTE — Progress Notes (Signed)
[facility] MD Progress Note [id number] Cheryl Cross  MRN:  [id number] DOB:: 10-Jan-1966 Age: 47 y.o. Date: 06/04/2013  Chief Complaint: Chief Complaint  Patient presents with  . Follow-up  . Medication Refill   History of present illness This is a 47 year old single Caucasian it white female who came to the appointment.  She's compliant with her psychiatric medication.  She likes Zoloft and trazodone.  She is sleeping better.  She denies any recent use of benzodiazepine.  She sleeping at least 6 hours.  She is working at [employer].  She likes her job.  Her family lives close by.  She has a good support system.  She has an irritability anger or any mood swing.  She is taking Lidoderm patches for her chronic pain.  She denies any tremors shakes .  She is now drinking or using any illegal substances.  Diagnosis:   Axis I: Major Depression, Recurrent severe, Substance Abuse and Substance Induced Mood Disorder Axis II: Deferred Axis III:  Past Medical History  Diagnosis Date  . Polycythemia vera(238.4) 12/07/2011  . Fibromyalgia   . Polycythemia vera(238.4)   . Bursitis     hips bilat  . Arthritis     back   . DDD (degenerative disc disease)   . Overactive bladder   . Depression   . PTSD (post-traumatic stress disorder)   . Anxiety   . Polycythemia vera(238.4)   . Obsessive-compulsive disorder    Axis IV: other psychosocial or environmental problems Axis V: 51-60 moderate symptoms  ADL's:  Intact  Sleep: Good, thanks to Trazodone  Appetite:  Fair  Suicidal Ideation:  Pt denies any thoughts, plans, intent of suicide\ Homicidal Ideation:  Pt denies any thoughts, plans, intent of homicide  AEB (as evidenced by):per pt report  Psychiatric Specialty Exam: ROS  Weight 148 lb (67.132 kg).Body mass index is 23.17 kg/(m^2).  General Appearance: Casual  Eye Contact::  Good  Speech:  Clear and Coherent  Volume:  Normal  Mood:  Anxious and   Affect:  Congruent   Thought Process:  Coherent, Linear and Logical  Orientation:  Full (Time, Place, and Person)  Thought Content:  WDL  Suicidal Thoughts:  No  Homicidal Thoughts:  No  Memory:  Immediate;   Good Recent;   Good Remote;   Good  Judgement:  Good  Insight:  Good  Psychomotor Activity:  TYpical  Concentration:  Good  Recall:  Good  Akathisia:  No  Handed:  Right  AIMS (if indicated):     Assets:  Communication Skills Desire for Improvement  Sleep:      Current Medications: Current Outpatient Prescriptions  Medication Sig Dispense Refill  . aspirin 81 MG tablet Take 1 tablet (81 mg total) by mouth daily. For Polycythemia Vera.      Marland Kitchen LIDODERM 5 % Place 1 patch onto the skin once a week.       . sertraline (ZOLOFT) 100 MG tablet Take 1.5 tablets (150 mg total) by mouth daily.  45 tablet  2  . traZODone (DESYREL) 50 MG tablet Take 1 tablet (50 mg total) by mouth at bedtime. For sleep.  30 tablet  2   No current facility-administered medications for this visit.   Lab Results:  Results for orders placed during the hospital encounter of 03/27/13 (from the past 8736 hour(s))  FERRITIN   Collection Time    03/27/13  1:45 PM      Result Value Range   Ferritin  6 (*) 10 - 291 ng/mL  CBC   Collection Time    03/27/13  1:45 PM      Result Value Range   WBC 7.1  4.0 - 10.5 K/uL   RBC 4.50  3.87 - 5.11 MIL/uL   Hemoglobin 13.5  12.0 - 15.0 g/dL   HCT 14.7  82.9 - 56.2 %   MCV 88.7  78.0 - 100.0 fL   MCH 30.0  26.0 - 34.0 pg   MCHC 33.8  30.0 - 36.0 g/dL   RDW 13.0  86.5 - 78.4 %   Platelets 333  150 - 400 K/uL  DIFFERENTIAL   Collection Time    03/27/13  1:45 PM      Result Value Range   Neutrophils Relative % 62  43 - 77 %   Neutro Abs 4.4  1.7 - 7.7 K/uL   Lymphocytes Relative 31  12 - 46 %   Lymphs Abs 2.2  0.7 - 4.0 K/uL   Monocytes Relative 6  3 - 12 %   Monocytes Absolute 0.4  0.1 - 1.0 K/uL   Eosinophils Relative 1  0 - 5 %   Eosinophils Absolute 0.1  0.0 - 0.7 K/uL    Basophils Relative 1  0 - 1 %   Basophils Absolute 0.0  0.0 - 0.1 K/uL  Results for orders placed during the hospital encounter of 11/28/12 (from the past 8736 hour(s))  HEMOGLOBIN   Collection Time    11/28/12  2:00 PM      Result Value Range   Hemoglobin 13.4  12.0 - 15.0 g/dL  Results for orders placed during the hospital encounter of 08/01/12 (from the past 8736 hour(s))  HEMOGLOBIN   Collection Time    08/01/12  2:05 PM      Result Value Range   Hemoglobin 13.7  12.0 - 15.0 g/dL  Results for orders placed in visit on 06/06/12 (from the past 8736 hour(s))  CBC & DIFF AND RETIC   Collection Time    06/06/12 11:06 AM      Result Value Range   WBC 6.1  3.9 - 10.3 10e3/uL   NEUT# 3.7  1.5 - 6.5 10e3/uL   HGB 14.0  11.6 - 15.9 g/dL   HCT 69.6  29.5 - 28.4 %   Platelets 355  145 - 400 10e3/uL   MCV 89.5  79.5 - 101.0 fL   MCH 30.7  25.1 - 34.0 pg   MCHC 34.3  31.5 - 36.0 g/dL   RBC 1.32  4.40 - 1.02 10e6/uL   RDW 13.8  11.2 - 14.5 %   lymph# 1.9  0.9 - 3.3 10e3/uL   MONO# 0.4  0.1 - 0.9 10e3/uL   Eosinophils Absolute 0.1  0.0 - 0.5 10e3/uL   Basophils Absolute 0.0  0.0 - 0.1 10e3/uL   NEUT% 60.6  38.4 - 76.8 %   LYMPH% 30.3  14.0 - 49.7 %   MONO% 7.0  0.0 - 14.0 %   EOS% 1.8  0.0 - 7.0 %   BASO% 0.3  0.0 - 2.0 %   Retic % 0.87  0.70 - 2.10 %   Retic Ct Abs 39.67  33.70 - 90.70 10e3/uL   Immature Retic Fract 3.20  1.60 - 10.00 %  FERRITIN   Collection Time    06/06/12 11:06 AM      Result Value Range   Ferritin 6 (*) 10 - 291 ng/mL  COMPREHENSIVE METABOLIC PANEL  Collection Time    06/06/12 11:06 AM      Result Value Range   Sodium 139  135 - 145 mEq/L   Potassium 4.5  3.5 - 5.3 mEq/L   Chloride 106  96 - 112 mEq/L   CO2 26  19 - 32 mEq/L   Glucose, Bld 79  70 - 99 mg/dL   BUN 8  6 - 23 mg/dL   Creatinine, Ser 1.61  0.50 - 1.10 mg/dL   Total Bilirubin 0.3  0.3 - 1.2 mg/dL   Alkaline Phosphatase 52  39 - 117 U/L   AST 8  0 - 37 U/L   ALT <8  0 - 35 U/L    Total Protein 6.3  6.0 - 8.3 g/dL   Albumin 4.2  3.5 - 5.2 g/dL   Calcium 9.5  8.4 - 09.6 mg/dL  LACTATE DEHYDROGENASE   Collection Time    06/06/12 11:06 AM      Result Value Range   LDH 96  94 - 250 U/L  IRON AND TIBC   Collection Time    06/06/12 11:06 AM      Result Value Range   Iron 81  42 - 145 ug/dL   UIBC 045  409 - 811 ug/dL   TIBC 914  782 - 956 ug/dL   %SAT 25  20 - 55 %   Physical Findings: AIMS:  , ,  ,  ,    CIWA:    COWS:     Treatment Plan Summary: Medication management  Plan: I will continue Zoloft and trazodone at present dose.  Patient at this time doesn't have any side effects.  Recommend to call us back if she has any question concerns or if she feels worsening symptoms.  Followup in 3 months.  MEDICATIONS this encounter: Meds ordered this encounter  Medications  . traZODone (DESYREL) 50 MG tablet    Sig: Take 1 tablet (50 mg total) by mouth at bedtime. For sleep.    Dispense:  30 tablet    Refill:  2  . sertraline (ZOLOFT) 100 MG tablet    Sig: Take 1.5 tablets (150 mg total) by mouth daily.    Dispense:  45 tablet    Refill:  2    Medical Decision Making Problem Points:  Established problem, stable/improving (1) and Review of last therapy session (1) Data Points:  Review or order clinical lab tests (1) Review of medication regiment & side effects (2)  Judye Lorino T. 06/04/2013, 9:14 AM

## 2013-06-05 ENCOUNTER — Other Ambulatory Visit (HOSPITAL_BASED_OUTPATIENT_CLINIC_OR_DEPARTMENT_OTHER): Payer: BC Managed Care – PPO

## 2013-06-05 DIAGNOSIS — D45 Polycythemia vera: Secondary | ICD-10-CM

## 2013-06-05 LAB — COMPREHENSIVE METABOLIC PANEL (CC13)
ALT: <6 U/L (ref 0–55)
AST: 7 U/L (ref 5–34)
Alkaline Phosphatase: 63 U/L (ref 40–150)
Potassium: 4 mEq/L (ref 3.5–5.1)
Sodium: 139 mEq/L (ref 136–145)
Total Bilirubin: 0.29 mg/dL (ref 0.20–1.20)
Total Protein: 6.7 g/dL (ref 6.4–8.3)

## 2013-06-05 LAB — CBC WITH DIFFERENTIAL/PLATELET
Basophils Absolute: 0.1 10*3/uL (ref 0.0–0.1)
EOS%: 2.4 % (ref 0.0–7.0)
Eosinophils Absolute: 0.2 10*3/uL (ref 0.0–0.5)
HCT: 38.6 % (ref 34.8–46.6)
HGB: 12.8 g/dL (ref 11.6–15.9)
MCHC: 33.2 g/dL (ref 31.5–36.0)
MONO#: 0.6 10*3/uL (ref 0.1–0.9)
MONO%: 6.8 % (ref 0.0–14.0)
NEUT%: 62.7 % (ref 38.4–76.8)
Platelets: 317 10*3/uL (ref 145–400)
WBC: 8.3 10*3/uL (ref 3.9–10.3)

## 2013-06-05 LAB — LACTATE DEHYDROGENASE (CC13): LDH: 154 U/L (ref 125–245)

## 2013-06-10 ENCOUNTER — Other Ambulatory Visit: Payer: Self-pay

## 2013-06-11 ENCOUNTER — Ambulatory Visit (INDEPENDENT_AMBULATORY_CARE_PROVIDER_SITE_OTHER): Payer: BC Managed Care – PPO | Admitting: Psychology

## 2013-06-11 DIAGNOSIS — F341 Dysthymic disorder: Secondary | ICD-10-CM

## 2013-06-11 DIAGNOSIS — F489 Nonpsychotic mental disorder, unspecified: Secondary | ICD-10-CM

## 2013-06-11 DIAGNOSIS — F5105 Insomnia due to other mental disorder: Secondary | ICD-10-CM

## 2013-06-11 DIAGNOSIS — F329 Major depressive disorder, single episode, unspecified: Secondary | ICD-10-CM

## 2013-06-12 ENCOUNTER — Ambulatory Visit (HOSPITAL_BASED_OUTPATIENT_CLINIC_OR_DEPARTMENT_OTHER): Payer: BC Managed Care – PPO | Admitting: Oncology

## 2013-06-12 ENCOUNTER — Other Ambulatory Visit: Payer: Self-pay | Admitting: Lab

## 2013-06-12 ENCOUNTER — Telehealth: Payer: Self-pay | Admitting: Oncology

## 2013-06-12 VITALS — BP 119/76 | HR 73 | Temp 97.0°F | Resp 18 | Ht 67.0 in | Wt 150.1 lb

## 2013-06-12 DIAGNOSIS — F341 Dysthymic disorder: Secondary | ICD-10-CM

## 2013-06-12 DIAGNOSIS — IMO0001 Reserved for inherently not codable concepts without codable children: Secondary | ICD-10-CM

## 2013-06-12 DIAGNOSIS — D45 Polycythemia vera: Secondary | ICD-10-CM

## 2013-06-12 NOTE — Telephone Encounter (Signed)
gv pt appt schedule for August 2015. Per pt requests lb/fu scheduled for August 2015 instead of September 2015. Per pt she comes back in August every year due to appt has to be before she starts school again. Also per 8/1 pof pt to have phelb's q11mo and lab order also given for q79mo with next phleb (07/31/13, 11/17/13, 03/26/14, 07/30/14). Per pt she does both lb/phleb @ medical day q40mo. Copy of pof given to desk nurse w/message from pt above re medical day. Desk nurse will arrange appts @ medical day - pt aware.

## 2013-06-14 ENCOUNTER — Other Ambulatory Visit: Payer: Self-pay | Admitting: Oncology

## 2013-06-14 NOTE — Progress Notes (Signed)
Hematology and Oncology Follow Up Visit  Cheryl Cross [id number] 1966-02-10 46 y.o. 06/14/2013 4:33 PM   Principle Diagnosis: Encounter Diagnosis  Name Primary?  . Polycythemia vera Yes     Interim History:   I have not seen Cheryl Cross for the last 2 years due to other intervening medical problems. She is followed here for polycythemia vera with some atypical features. She has normal serum erythropoietin levels. She tested negative for the JAK-2 gene mutation. She had a normal bone marrow biopsy. Her hemoglobins ran as high as 19 g. In the past. She had associated constitutional symptoms that were relieved with phlebotomy. She was started on a phlebotomy program in December 2006 and is currently getting phlebotomies every 3 months. Constitutional symptoms resolved with the phlebotomies until she developed what  appeared to be a fibromyalgia syndrome. She had evaluations by rheumatology and neurology. There was a lot going on in her social background with loss of work due to her physical symptoms and a separation from her husband. 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 She was admitted to behavioral health last April 2013 and has been working with a Warden/ranger and a psychiatrist since that time. There has been a dramatic improvement in her overall well being since those interventions. She is off the anxiolytic drugs. She is currently on Zoloft 150 mg daily and using Desyrel 50 mg at night as needed for sleep. Only other medications right now are aspirin 81 mg daily prescribed by me as thromboprophylaxis in view of her myeloproliferative disorder, and a 5% Lidoderm patch that she puts on her skin once a week.  No other interim medical problems.  Medications: reviewed  Allergies:  Allergies  Allergen Reactions  . Cyclobenzaprine     Hot sensation  . Diclofenac Sodium     REACTION: Bad itch  . Gabapentin Hives  . Penicillins     unknown  . Robaxin (Methocarbamol)  Hives  . Voltaren (Diclofenac Sodium) Itching    Review of Systems: Constitutional:   Result constitutional symptoms Respiratory: No cough or dyspnea Cardiovascular:  No chest pain or palpitations Gastrointestinal: No change in bowel habit Genito-Urinary:  Musculoskeletal: She has learned to adjust to her myalgias Neurologic: No persistent headache, no change in vision, no focal weakness Skin: No rash or ecchymosis Remaining ROS negative.  Physical Exam: Blood pressure 119/76, pulse 73, temperature 97 F (36.1 C), temperature source Oral, resp. rate 18, height 5\' 7"  (1.702 m), weight 150 lb 1.6 oz (68.085 kg). Wt Readings from Last 3 Encounters:  06/12/13 150 lb 1.6 oz (68.085 kg)  06/04/13 148 lb (67.132 kg)  02/25/13 149 lb 12.8 oz (67.949 kg)     General appearance: Well-nourished Caucasian woman HENNT: Pharynx no erythema or exudate Lymph nodes: No lymphadenopathy Breasts: Lungs: Clear to auscultation resonant to percussion Heart: Regular rhythm no murmur Abdomen: Soft, nontender, no mass, no organomegaly Extremities: No edema, no calf tenderness Musculoskeletal: No joint deformities GU: Vascular: No cyanosis Neurologic: Mental status intact, cranial nerves grossly normal, motor strength 5 over 5, reflexes 1+ symmetric Skin: No rash or ecchymosis  Lab Results: Lab Results  Component Value Date   WBC 8.3 06/05/2013   HGB 12.8 06/05/2013   HCT 38.6 06/05/2013   MCV 89.6 06/05/2013   PLT 317 06/05/2013     Chemistry      Component Value Date/Time   NA 139 06/05/2013 1327   NA 139 06/06/2012 1106   K 4.0 06/05/2013 1327  K 4.5 06/06/2012 1106   CL 106 06/06/2012 1106   CO2 26 06/05/2013 1327   CO2 26 06/06/2012 1106   BUN 4.2* 06/05/2013 1327   BUN 8 06/06/2012 1106   CREATININE 0.8 06/05/2013 1327   CREATININE 0.65 06/06/2012 1106      Component Value Date/Time   CALCIUM 9.2 06/05/2013 1327   CALCIUM 9.5 06/06/2012 1106   ALKPHOS 63 06/05/2013 1327   ALKPHOS 52  06/06/2012 1106   AST 7 06/05/2013 1327   AST 8 06/06/2012 1106   ALT <6 Repeated and Verified 06/05/2013 1327   ALT <8 06/06/2012 1106   BILITOT 0.29 06/05/2013 1327   BILITOT 0.3 06/06/2012 1106      Impression: #1. Polycythemia vera Stable on intermittent phlebotomy. Plan continue the same.  #2. Fibromyalgia syndrome  #3. Reactive depression secondary to #2. Remarkable improvement with counseling and antidepressants.    CC:. Dr. Orson Aloe; Dr. Alben Deeds   Levert Feinstein, MD 8/3/20144:33 PM

## 2013-07-27 ENCOUNTER — Encounter (HOSPITAL_COMMUNITY): Payer: Self-pay | Admitting: Psychology

## 2013-07-27 NOTE — Progress Notes (Signed)
Patient:  Cheryl Cross   DOB: 1966/08/05  MR Number: [id number]  Location: [facility] [address] [place] [address] Dept: [phone]  Start: 4 PM End: 5 PM  Provider/Observer:     Hershal Coria PSYD  Chief Complaint:      Chief Complaint  Patient presents with  . Depression  . Other    Insomnia    Reason For Service:     Patient was referred because of extreme anxiety and nervousness this continued after discharge from the hospital. She was hospitalized twice in the behavioral health unit and suffered from dehydration an allergic reaction to medicines. She describes major stressors right now have to do with her financial situation, family issues, coping with fibromyalgia, heightened pulse rate, and feelings of nervousness. The patient reports that she developed these current symptoms a few days after discharge from behavioral health and had to go back to the hospital due to dehydration an allergic reaction. She is currently staying with her parents and dealing with medication changes. The patient reports that she has medical conditions related to fibromyalgia, significant degenerative disc disease and Polycythemia vera.   Interventions Strategy:  Cognitive/behavioral psychotherapeutic interventions  Participation Level:   Active  Participation Quality:  Appropriate      Behavioral Observation:  Well Groomed, Alert, and Depressed.   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  Content of Session:   Review current symptoms and continue to work on therapeutic interventions for issues of anxiety and depression  as well as coping with her overall health and medical status.  Current Status:   The patient reports that with the exception of some mild depressive faces the patient is been doing very well without any major depressive events. The patient reports that she continues to actively work on her coping skills and strategies around recurrent depression.  Patient Progress:   She has been actively working on the initial coping skills we worked on and we will continue look at this as well as issues related to her medications.  Target Goals:   Target goals including addressing the issues related to her depression and anxiety in coping with her medical status.  Last Reviewed:   06/11/2013  Goals Addressed Today:    Today we worked on cognitive behavioral coping skills for issues of depression.  Impression/Diagnosis:  This point, it is clear that the patient has been through a great deal of stress with family issues and difficulties with been able to maintain her job. 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 She has experienced pain and fatigue as a result. I think that she may actually not have fibromyalgia and some of her current symptoms could be the result of potential side effects due to the Lyrica. I have suggested she contact her primary care doctor about discontinuing Lyrica as well as some of the other medications that have been discontinued already.   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

## 2013-07-31 ENCOUNTER — Encounter (HOSPITAL_COMMUNITY): Admission: RE | Admit: 2013-07-31 | Payer: BC Managed Care – PPO | Source: Ambulatory Visit

## 2013-08-06 ENCOUNTER — Ambulatory Visit (INDEPENDENT_AMBULATORY_CARE_PROVIDER_SITE_OTHER): Payer: BC Managed Care – PPO | Admitting: Psychology

## 2013-08-06 DIAGNOSIS — F341 Dysthymic disorder: Secondary | ICD-10-CM

## 2013-08-06 DIAGNOSIS — F489 Nonpsychotic mental disorder, unspecified: Secondary | ICD-10-CM

## 2013-08-06 DIAGNOSIS — F429 Obsessive-compulsive disorder, unspecified: Secondary | ICD-10-CM

## 2013-08-06 DIAGNOSIS — F329 Major depressive disorder, single episode, unspecified: Secondary | ICD-10-CM

## 2013-08-06 DIAGNOSIS — F418 Other specified anxiety disorders: Secondary | ICD-10-CM

## 2013-09-07 ENCOUNTER — Ambulatory Visit (HOSPITAL_COMMUNITY): Payer: Self-pay | Admitting: Psychiatry

## 2013-09-15 ENCOUNTER — Ambulatory Visit (HOSPITAL_COMMUNITY): Payer: Self-pay | Admitting: Psychiatry

## 2013-09-15 ENCOUNTER — Ambulatory Visit (INDEPENDENT_AMBULATORY_CARE_PROVIDER_SITE_OTHER): Payer: BC Managed Care – PPO | Admitting: Psychiatry

## 2013-09-15 ENCOUNTER — Encounter (HOSPITAL_COMMUNITY): Payer: Self-pay | Admitting: Psychiatry

## 2013-09-15 VITALS — BP 130/80 | Ht 67.0 in | Wt 155.0 lb

## 2013-09-15 DIAGNOSIS — F1994 Other psychoactive substance use, unspecified with psychoactive substance-induced mood disorder: Secondary | ICD-10-CM

## 2013-09-15 DIAGNOSIS — F418 Other specified anxiety disorders: Secondary | ICD-10-CM

## 2013-09-15 DIAGNOSIS — F332 Major depressive disorder, recurrent severe without psychotic features: Secondary | ICD-10-CM

## 2013-09-15 DIAGNOSIS — F329 Major depressive disorder, single episode, unspecified: Secondary | ICD-10-CM

## 2013-09-15 DIAGNOSIS — F1910 Other psychoactive substance abuse, uncomplicated: Secondary | ICD-10-CM

## 2013-09-15 DIAGNOSIS — F429 Obsessive-compulsive disorder, unspecified: Secondary | ICD-10-CM

## 2013-09-15 MED ORDER — SERTRALINE HCL 100 MG PO TABS: 150.0000 mg | ORAL_TABLET | Freq: Every day | ORAL | Status: DC

## 2013-09-15 MED ORDER — TRAZODONE HCL 50 MG PO TABS: 50.0000 mg | ORAL_TABLET | Freq: Every day | ORAL | Status: DC

## 2013-09-15 NOTE — Progress Notes (Signed)
Patient ID: Cheryl Cross, female   DOB: [birth year], 47 y.o.   MRN: [id number] [facility] MD Progress Note [id number] Cheryl Cross  MRN:  [id number] DOB:: 02-15-1966 Age: 47 y.o. Date: 09/15/2013  Chief Complaint: Chief Complaint  Patient presents with  . Anxiety  . Depression  . Follow-up   History of present illness This patient is a 47 year old separated white female who lives alone in [place]. She is a Runner, broadcasting/film/video at [employer].  The patient stated that last year in May she got extremely depressed and became suicidal. She had back pain and fibromyalgia. She got hooked on taking too many Xanax and was sleeping all the time and unable to function. She was hospitalized at behavioral health hospital and got off the Xanax and since then has been on trazodone and Zoloft. 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 She sees Dr. Shelva Majestic here which she finds very helpful. She denies being depressed and stays on a good regimen of eating and sleeping. Her mood is generally been good and she's not significantly anxious. She's not abusing any substances and she denies suicidal ideation  Diagnosis:   Axis I: Major Depression, Recurrent severe, Substance Abuse and Substance Induced Mood Disorder Axis II: Deferred Axis III:  Past Medical History  Diagnosis Date  . Polycythemia vera(238.4) 12/07/2011  . Fibromyalgia   . Polycythemia vera(238.4)   . Bursitis     hips bilat  . Arthritis     back   . DDD (degenerative disc disease)   . Overactive bladder   . Depression   . PTSD (post-traumatic stress disorder)   . Anxiety   . Polycythemia vera(238.4)   . Obsessive-compulsive disorder    Axis IV: other psychosocial or environmental problems Axis V: 51-60 moderate symptoms  ADL's:  Intact  Sleep: Good, thanks to Trazodone  Appetite:  Fair  Suicidal Ideation:  Pt denies any thoughts, plans, intent of suicide\ Homicidal  Ideation:  Pt denies any thoughts, plans, intent of homicide  AEB (as evidenced by):per pt report  Psychiatric Specialty Exam: ROS  Blood pressure 130/80, height 5\' 7"  (1.702 m), weight 155 lb (70.308 kg).Body mass index is 24.27 kg/(m^2).  General Appearance: Casual  Eye Contact::  Good  Speech:  Clear and Coherent  Volume:  Normal  Mood:  Anxious and   Affect:  Congruent  Thought Process:  Coherent, Linear and Logical  Orientation:  Full (Time, Place, and Person)  Thought Content:  WDL  Suicidal Thoughts:  No  Homicidal Thoughts:  No  Memory:  Immediate;   Good Recent;   Good Remote;   Good  Judgement:  Good  Insight:  Good  Psychomotor Activity:  TYpical  Concentration:  Good  Recall:  Good  Akathisia:  No  Handed:  Right  AIMS (if indicated):     Assets:  Communication Skills Desire for Improvement  Sleep:      Current Medications: Current Outpatient Prescriptions  Medication Sig Dispense Refill  . aspirin 81 MG tablet Take 1 tablet (81 mg total) by mouth daily. For Polycythemia Vera.      Marland Kitchen LIDODERM 5 % Place 1 patch onto the skin once a week.       . sertraline (ZOLOFT) 100 MG tablet Take 1.5 tablets (150 mg total) by mouth daily.  45 tablet  2  . traZODone (DESYREL) 50 MG tablet Take 1 tablet (50 mg total) by mouth at bedtime. For sleep.  30  tablet  2   No current facility-administered medications for this visit.   Lab Results:  Results for orders placed in visit on 06/05/13 (from the past 8736 hour(s))  CBC WITH DIFFERENTIAL   Collection Time    06/05/13  1:27 PM      Result Value Range   WBC 8.3  3.9 - 10.3 10e3/uL   NEUT# 5.2  1.5 - 6.5 10e3/uL   HGB 12.8  11.6 - 15.9 g/dL   HCT 96.0  45.4 - 09.8 %   Platelets 317  145 - 400 10e3/uL   MCV 89.6  79.5 - 101.0 fL   MCH 29.8  25.1 - 34.0 pg   MCHC 33.2  31.5 - 36.0 g/dL   RBC 1.19  1.47 - 8.29 10e6/uL   RDW 14.4  11.2 - 14.5 %   lymph# 2.3  0.9 - 3.3 10e3/uL   MONO# 0.6  0.1 - 0.9 10e3/uL    Eosinophils Absolute 0.2  0.0 - 0.5 10e3/uL   Basophils Absolute 0.1  0.0 - 0.1 10e3/uL   NEUT% 62.7  38.4 - 76.8 %   LYMPH% 27.2  14.0 - 49.7 %   MONO% 6.8  0.0 - 14.0 %   EOS% 2.4  0.0 - 7.0 %   BASO% 0.9  0.0 - 2.0 %  LACTATE DEHYDROGENASE (CC13)   Collection Time    06/05/13  1:27 PM      Result Value Range   LDH 154  125 - 245 U/L  COMPREHENSIVE METABOLIC PANEL (CC13)   Collection Time    06/05/13  1:27 PM      Result Value Range   Sodium 139  136 - 145 mEq/L   Potassium 4.0  3.5 - 5.1 mEq/L   Chloride 106  98 - 109 mEq/L   CO2 26  22 - 29 mEq/L   Glucose 98  70 - 140 mg/dl   BUN 4.2 (*) 7.0 - 56.2 mg/dL   Creatinine 0.8  0.6 - 1.1 mg/dL   Total Bilirubin 1.30  0.20 - 1.20 mg/dL   Alkaline Phosphatase 63  40 - 150 U/L   AST 7  5 - 34 U/L   ALT <6 Repeated and Verified  0 - 55 U/L   Total Protein 6.7  6.4 - 8.3 g/dL   Albumin 3.8  3.5 - 5.0 g/dL   Calcium 9.2  8.4 - 86.5 mg/dL  FERRITIN [facility]   Collection Time    06/05/13  1:27 PM      Result Value Range   Ferritin 9  9 - 269 ng/ml  Results for orders placed during the hospital encounter of 03/27/13 (from the past 8736 hour(s))  FERRITIN   Collection Time    03/27/13  1:45 PM      Result Value Range   Ferritin 6 (*) 10 - 291 ng/mL  CBC   Collection Time    03/27/13  1:45 PM      Result Value Range   WBC 7.1  4.0 - 10.5 K/uL   RBC 4.50  3.87 - 5.11 MIL/uL   Hemoglobin 13.5  12.0 - 15.0 g/dL   HCT 78.4  69.6 - 29.5 %   MCV 88.7  78.0 - 100.0 fL   MCH 30.0  26.0 - 34.0 pg   MCHC 33.8  30.0 - 36.0 g/dL   RDW 28.4  13.2 - 44.0 %   Platelets 333  150 - 400 K/uL  DIFFERENTIAL   Collection Time  03/27/13  1:45 PM      Result Value Range   Neutrophils Relative % 62  43 - 77 %   Neutro Abs 4.4  1.7 - 7.7 K/uL   Lymphocytes Relative 31  12 - 46 %   Lymphs Abs 2.2  0.7 - 4.0 K/uL   Monocytes Relative 6  3 - 12 %   Monocytes Absolute 0.4  0.1 - 1.0 K/uL   Eosinophils Relative 1  0 - 5 %   Eosinophils Absolute  0.1  0.0 - 0.7 K/uL   Basophils Relative 1  0 - 1 %   Basophils Absolute 0.0  0.0 - 0.1 K/uL  Results for orders placed during the hospital encounter of 11/28/12 (from the past 8736 hour(s))  HEMOGLOBIN   Collection Time    11/28/12  2:00 PM      Result Value Range   Hemoglobin 13.4  12.0 - 15.0 g/dL   Physical Findings: AIMS:  , ,  ,  ,    CIWA:    COWS:     Treatment Plan Summary: Medication management  Plan: I will continue Zoloft and trazodone at present dose.  Patient at this time doesn't have any side effects.  Recommend to call us back if she has any question concerns or if she feels worsening symptoms.  Followup in 3 months.  MEDICATIONS this encounter: Meds ordered this encounter  Medications  . sertraline (ZOLOFT) 100 MG tablet    Sig: Take 1.5 tablets (150 mg total) by mouth daily.    Dispense:  45 tablet    Refill:  2  . traZODone (DESYREL) 50 MG tablet    Sig: Take 1 tablet (50 mg total) by mouth at bedtime. For sleep.    Dispense:  30 tablet    Refill:  2    Medical Decision Making Problem Points:  Established problem, stable/improving (1) and Review of last therapy session (1) Data Points:  Review or order clinical lab tests (1) Review of medication regiment & side effects (2)  ROSS, DEBORAH 09/15/2013, 3:59 PM

## 2013-09-17 ENCOUNTER — Ambulatory Visit (HOSPITAL_COMMUNITY): Payer: Self-pay | Admitting: Psychology

## 2013-09-21 ENCOUNTER — Other Ambulatory Visit: Payer: Self-pay | Admitting: Oncology

## 2013-09-21 DIAGNOSIS — D45 Polycythemia vera: Secondary | ICD-10-CM

## 2013-09-22 ENCOUNTER — Encounter (HOSPITAL_COMMUNITY): Payer: Self-pay

## 2013-09-22 ENCOUNTER — Encounter (HOSPITAL_COMMUNITY)
Admission: RE | Admit: 2013-09-22 | Discharge: 2013-09-22 | Disposition: A | Discharge status: Home / Self Care | Payer: BC Managed Care – PPO | Source: Ambulatory Visit | Attending: Oncology | Admitting: Oncology

## 2013-09-22 VITALS — BP 114/73 | HR 72 | Temp 96.9°F | Resp 16

## 2013-09-22 DIAGNOSIS — D45 Polycythemia vera: Secondary | ICD-10-CM | POA: Insufficient documentation

## 2013-09-22 LAB — CBC
HCT: 40.9 % (ref 36.0–46.0)
MCH: 31.1 pg (ref 26.0–34.0)
MCHC: 34.2 g/dL (ref 30.0–36.0)
MCV: 90.9 fL (ref 78.0–100.0)
RDW: 14.1 % (ref 11.5–15.5)
WBC: 6.3 10*3/uL (ref 4.0–10.5)

## 2013-09-22 NOTE — Procedures (Signed)
HGB 14.0, Therapeutic Phlebotomy performed per orders, of blood removed, started at 1058, ended at 1122. Pt. Tolerated well.

## 2013-09-29 ENCOUNTER — Encounter (HOSPITAL_COMMUNITY): Payer: Self-pay | Admitting: Psychology

## 2013-09-29 NOTE — Progress Notes (Signed)
Patient:  Cheryl Cross   DOB: Jul 09, 1966  MR Number: [id number]  Location: [facility]HAVIORAL [facility] [address] [place] [address] Dept: [phone]  Start: 4 PM End: 5 PM  Provider/Observer:     Hershal Coria PSYD  Chief Complaint:      Chief Complaint  Patient presents with  . Depression  . Anxiety  . Stress    Reason For Service:     Patient was referred because of extreme anxiety and nervousness this continued after discharge from the hospital. She was hospitalized twice in the behavioral health unit and suffered from dehydration an allergic reaction to medicines. She describes major stressors right now have to do with her financial situation, family issues, coping with fibromyalgia, heightened pulse rate, and feelings of nervousness. The patient reports that she developed these current symptoms a few days after discharge from behavioral health and had to go back to the hospital due to dehydration an allergic reaction. She is currently staying with her parents and dealing with medication changes. The patient reports that she has medical conditions related to fibromyalgia, significant degenerative disc disease and Polycythemia vera.   Interventions Strategy:  Cognitive/behavioral psychotherapeutic interventions  Participation Level:   Active  Participation Quality:  Appropriate      Behavioral Observation:  Well Groomed, Alert, and Depressed.   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  Content of Session:   Review current symptoms and continue to work on therapeutic interventions for issues of anxiety and depression  as well as coping with her overall health and medical status.  Current Status:   The patient reports that with the exception of some mild depressive faces the patient is been doing very well without any major depressive events. The patient reports that she continues to actively work on her coping skills and strategies around recurrent depression.  Patient Progress:   She has been actively working on the initial coping skills we worked on and we will continue look at this as well as issues related to her medications.  Target Goals:   Target goals including addressing the issues related to her depression and anxiety in coping with her medical status.  Last Reviewed:   08/06/2013  Goals Addressed Today:    Today we worked on cognitive behavioral coping skills for issues of depression.  Impression/Diagnosis:  This point, it is clear that the patient has been through a great deal of stress with family issues and difficulties with been able to maintain her job. 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 She has experienced pain and fatigue as a result. I think that she may actually not have fibromyalgia and some of her current symptoms could be the result of potential side effects due to the Lyrica. I have suggested she contact her primary care doctor about discontinuing Lyrica as well as some of the other medications that have been discontinued already.   Diagnosis:    Axis I:  Major depressive disorder  Insomnia  secondary to depression with anxiety  OCD (obsessive compulsive disorder)      Axis II: Deferred

## 2013-10-13 ENCOUNTER — Ambulatory Visit (INDEPENDENT_AMBULATORY_CARE_PROVIDER_SITE_OTHER): Payer: BC Managed Care – PPO | Admitting: Psychology

## 2013-10-13 DIAGNOSIS — F489 Nonpsychotic mental disorder, unspecified: Secondary | ICD-10-CM

## 2013-10-13 DIAGNOSIS — F429 Obsessive-compulsive disorder, unspecified: Secondary | ICD-10-CM

## 2013-10-13 DIAGNOSIS — F418 Other specified anxiety disorders: Secondary | ICD-10-CM

## 2013-10-13 DIAGNOSIS — F329 Major depressive disorder, single episode, unspecified: Secondary | ICD-10-CM

## 2013-10-13 DIAGNOSIS — F341 Dysthymic disorder: Secondary | ICD-10-CM

## 2013-10-14 ENCOUNTER — Encounter (HOSPITAL_COMMUNITY): Payer: Self-pay | Admitting: Psychology

## 2013-10-14 NOTE — Progress Notes (Signed)
Patient:  Cheryl Cross   DOB: 18-Apr-1966  MR Number: [id number]  Location: BEHAVIORAL [facility] [address] [place] [address] Dept: [phone]  Start: 4 PM End: 5 PM  Provider/Observer:     Hershal Coria PSYD  Chief Complaint:      Chief Complaint  Patient presents with  . Depression  . Anxiety    Reason For Service:     Patient was referred because of extreme anxiety and nervousness this continued after discharge from the hospital. She was hospitalized twice in the behavioral health unit and suffered from dehydration an allergic reaction to medicines. She describes major stressors right now have to do with her financial situation, family issues, coping with fibromyalgia, heightened pulse rate, and feelings of nervousness. The patient reports that she developed these current symptoms a few days after discharge from behavioral health and had to go back to the hospital due to dehydration an allergic reaction. She is currently staying with her parents and dealing with medication changes. The patient reports that she has medical conditions related to fibromyalgia, significant degenerative disc disease and Polycythemia vera.   Interventions Strategy:  Cognitive/behavioral psychotherapeutic interventions  Participation Level:   Active  Participation Quality:  Appropriate      Behavioral Observation:  Well Groomed, Alert, and Depressed.   Current Psychosocial Factors: The patient reports that she is continued to do well at school and has not had any major depressive events recently. The patient reports that she has been actively working on coping skills and strategies particularly around obsessive and worrying about things and other issues that were similar things happen with her parents..  Content of Session:   Review current symptoms and continue to work on therapeutic interventions for issues  of anxiety and depression as well as coping with her overall health and medical status.  Current Status:   The patient reports that her depression continues to be reduced and that she's been actively working on more complicated behavioral interventions and cognitive interventions for her issues of depression.  Patient Progress:   She has been actively working on the initial coping skills we worked on and we will continue look at this as well as issues related to her medications.  Target Goals:   Target goals including addressing the issues related to her depression and anxiety in coping with her medical status.  Last Reviewed:   10/13/2013  Goals Addressed Today:    Today we worked on cognitive behavioral coping skills for issues of depression.  Impression/Diagnosis:  This point, it is clear that the patient has been through a great deal of stress with family issues and difficulties with been able to maintain her job. 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 She has experienced pain and fatigue as a result. I think that she may actually not have fibromyalgia and some of her current symptoms could be the result of potential side effects due to the Lyrica. I have suggested she contact her primary care doctor about discontinuing Lyrica as well as some of the other medications that have been discontinued already.   Diagnosis:    Axis I:  Major depressive disorder  OCD (obsessive compulsive disorder)  Insomnia secondary to depression with anxiety      Axis II: Deferred

## 2013-11-09 ENCOUNTER — Other Ambulatory Visit: Payer: Self-pay

## 2013-11-09 DIAGNOSIS — Z1231 Encounter for screening mammogram for malignant neoplasm of breast: Secondary | ICD-10-CM

## 2013-11-30 ENCOUNTER — Other Ambulatory Visit (HOSPITAL_COMMUNITY): Payer: Self-pay | Admitting: Family Medicine

## 2013-11-30 DIAGNOSIS — R109 Unspecified abdominal pain: Secondary | ICD-10-CM

## 2013-12-02 ENCOUNTER — Ambulatory Visit (HOSPITAL_COMMUNITY)
Admission: RE | Admit: 2013-12-02 | Discharge: 2013-12-02 | Disposition: A | Discharge status: Home / Self Care | Payer: BC Managed Care – PPO | Source: Ambulatory Visit | Attending: Family Medicine | Admitting: Family Medicine

## 2013-12-02 ENCOUNTER — Ambulatory Visit: Payer: Self-pay

## 2013-12-02 DIAGNOSIS — R109 Unspecified abdominal pain: Secondary | ICD-10-CM | POA: Insufficient documentation

## 2013-12-02 IMAGING — US US ABDOMEN COMPLETE
1 series · 13 of 25 positions shown · non-contrast
Comparison: None.

CLINICAL DATA: Abdominal pain

EXAM:
ULTRASOUND ABDOMEN COMPLETE

[Series 1: us abdomen complete · 0.21mm/px · 13 of 107 slices shown]
[im 1/107]
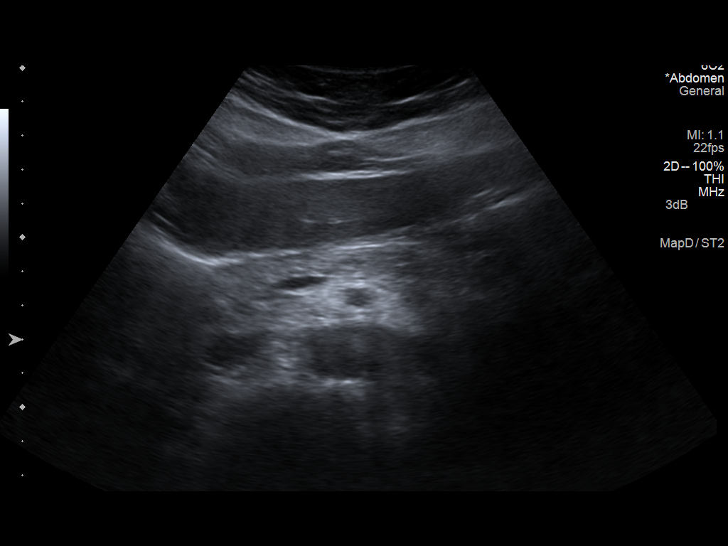
[im 9/107]
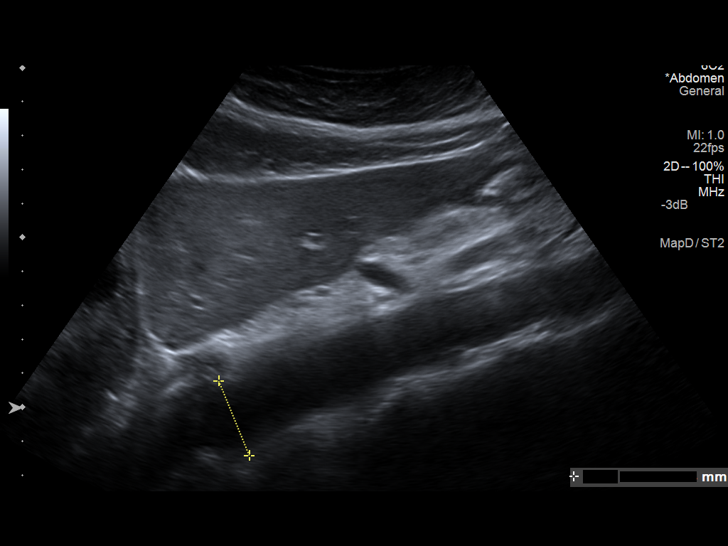
[im 18/107]
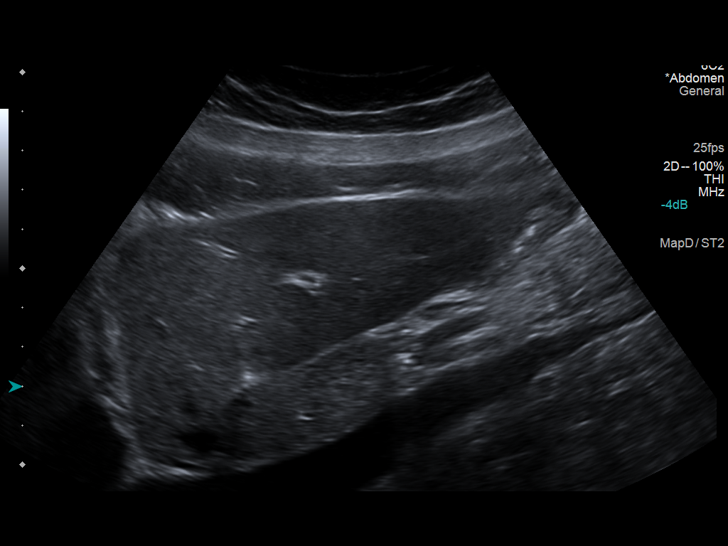
[im 27/107]
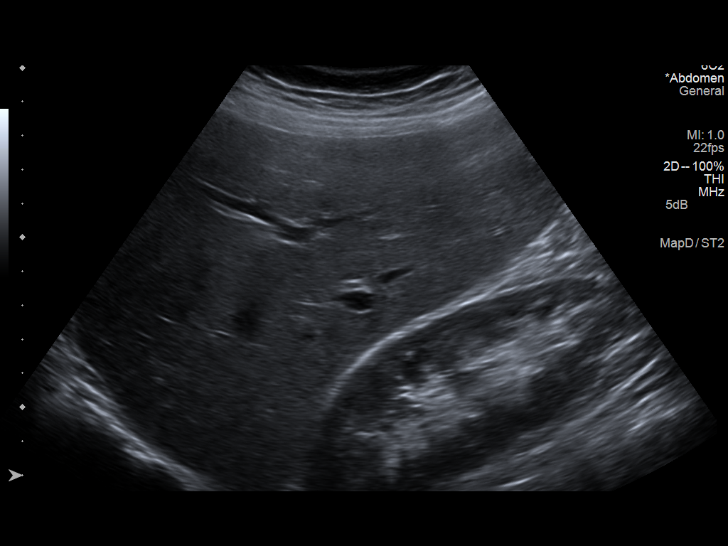
[im 36/107]
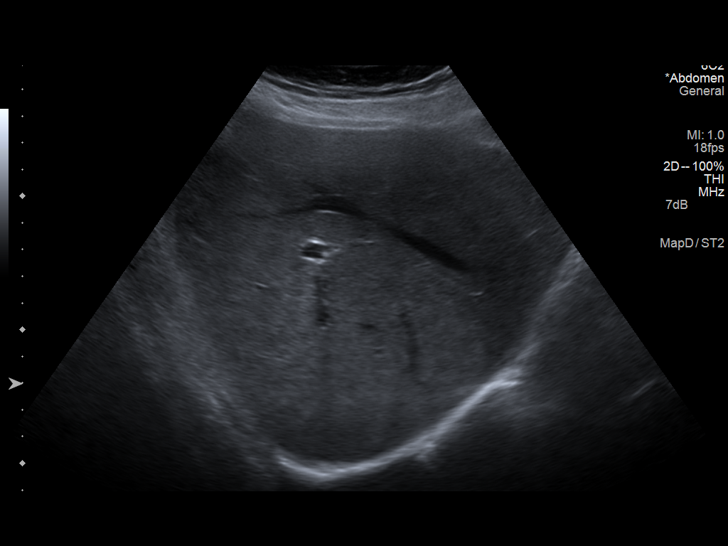
[im 45/107]
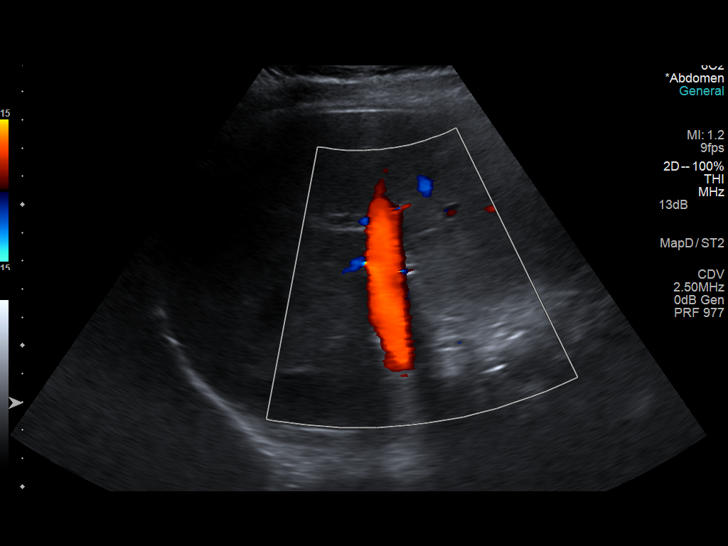
[im 54/107]
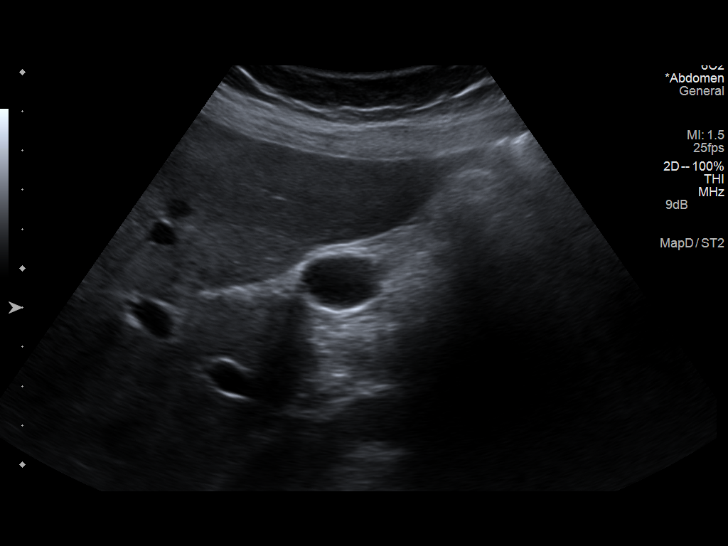
[im 62/107]
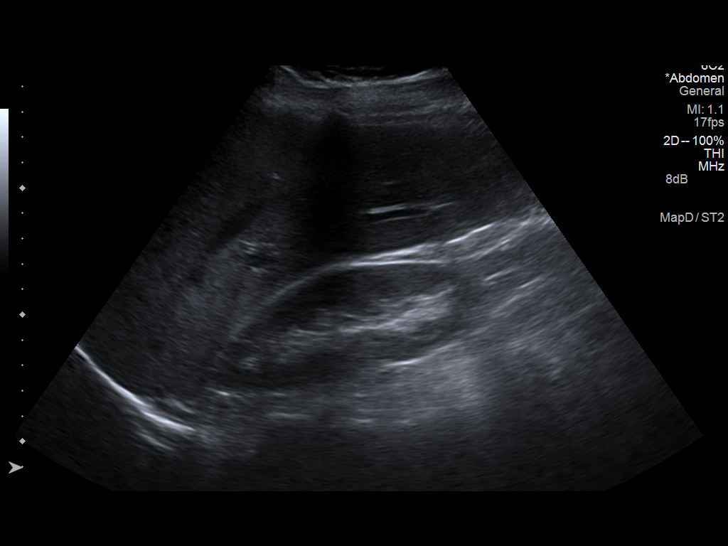
[im 71/107]
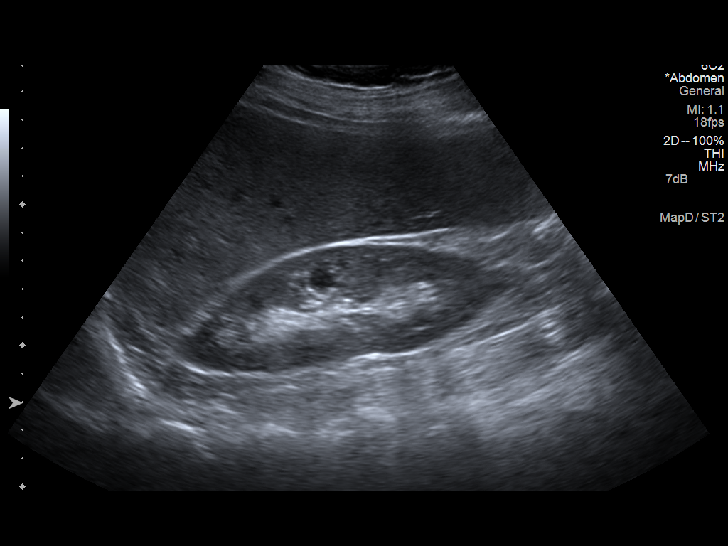
[im 80/107]
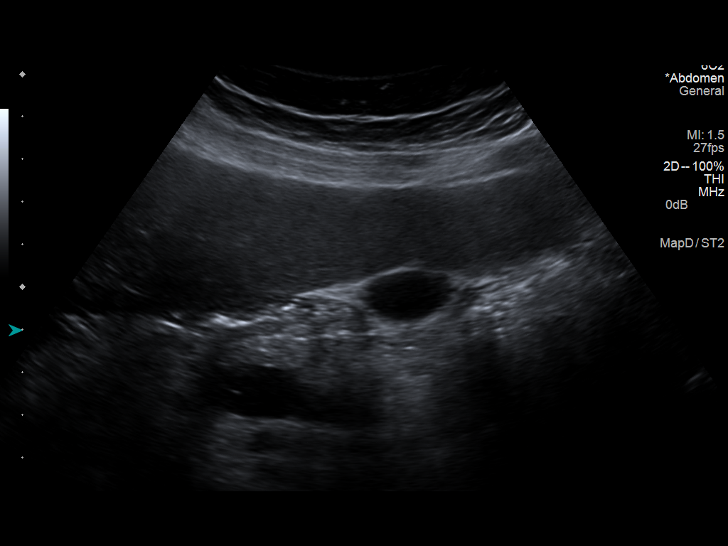
[im 89/107]
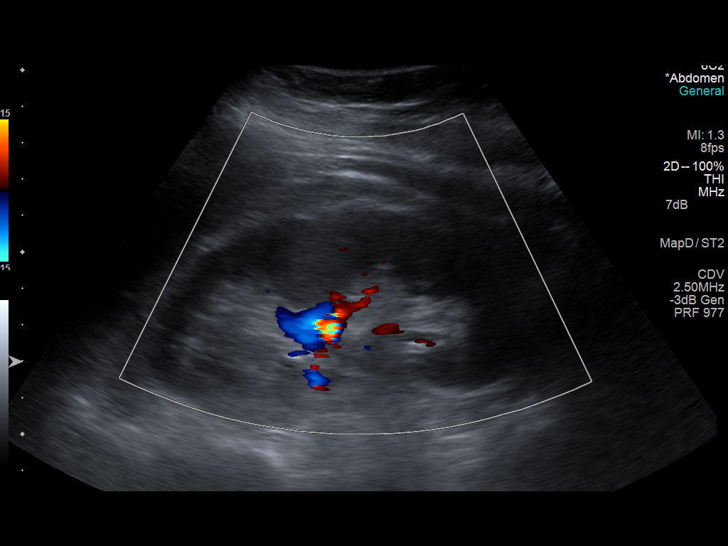
[im 98/107]
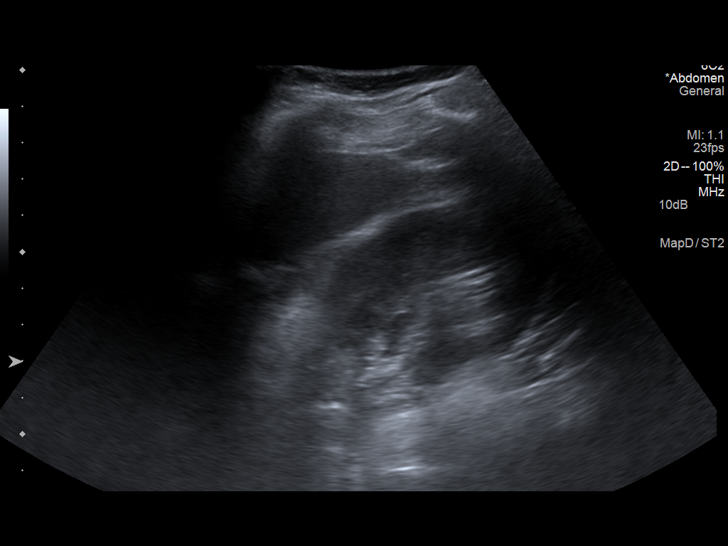
[im 107/107]
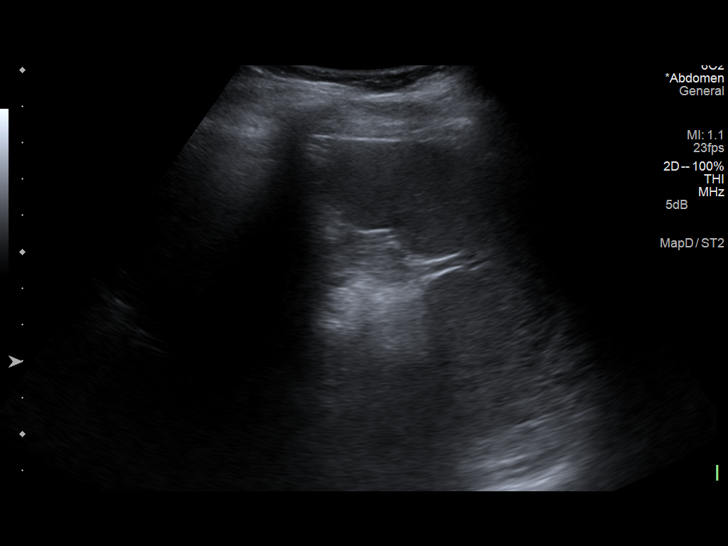

[13 of 25 positions shown; findings below may reference images not displayed]

FINDINGS: Gallbladder:

The gallbladder is adequately distended with no evidence of stones,
wall thickening, or pericholecystic fluid. There is no positive
sonographic Murphy's sign.

Common bile duct:

Diameter: 3.8 mm.

Liver:

The liver exhibits no focal mass nor ductal dilation. Portal venous
flow appears normal in direction toward the liver.

IVC:

No abnormality visualized.

Pancreas:

The observed portions of the pancreas exhibit no evidence of masses
nor ductal dilation nor peripancreatic fluid collections

Spleen:

Size and appearance within normal limits.

Right Kidney:

Length: 11.5 cm. Echogenicity within normal limits. There is no
hydronephrosis or cystic or solid appearing mass.

Left Kidney:

Length: 11.6 cm. Echogenicity within normal limits. There is a 7 mm
midpole hyperechoic focus with minimal distal shadowing which may
reflect a parenchymal calcification or collecting system stone.
There is no hydronephrosis

Abdominal aorta:

No aneurysm visualized.

Other findings:

None.
IMPRESSION: 1. The liver, gallbladder, common bile duct, and spleen are normal
in appearance.
2. Neither kidney exhibits evidence of obstruction. There is a 7 mm
diameter hyperechoic focus which may reflect a nonobstructing stone
or parenchymal calcification in the midpole of the left kidney.

## 2013-12-03 ENCOUNTER — Ambulatory Visit (HOSPITAL_COMMUNITY): Payer: BC Managed Care – PPO

## 2013-12-16 ENCOUNTER — Encounter (HOSPITAL_COMMUNITY): Payer: Self-pay | Admitting: Psychiatry

## 2013-12-16 ENCOUNTER — Ambulatory Visit (INDEPENDENT_AMBULATORY_CARE_PROVIDER_SITE_OTHER): Payer: BC Managed Care – PPO | Admitting: Psychiatry

## 2013-12-16 VITALS — Ht 67.0 in | Wt 153.0 lb

## 2013-12-16 DIAGNOSIS — F418 Other specified anxiety disorders: Secondary | ICD-10-CM

## 2013-12-16 DIAGNOSIS — F1994 Other psychoactive substance use, unspecified with psychoactive substance-induced mood disorder: Secondary | ICD-10-CM

## 2013-12-16 DIAGNOSIS — F429 Obsessive-compulsive disorder, unspecified: Secondary | ICD-10-CM

## 2013-12-16 DIAGNOSIS — F332 Major depressive disorder, recurrent severe without psychotic features: Secondary | ICD-10-CM

## 2013-12-16 DIAGNOSIS — F329 Major depressive disorder, single episode, unspecified: Secondary | ICD-10-CM

## 2013-12-16 DIAGNOSIS — F5105 Insomnia due to other mental disorder: Secondary | ICD-10-CM

## 2013-12-16 DIAGNOSIS — F1910 Other psychoactive substance abuse, uncomplicated: Secondary | ICD-10-CM

## 2013-12-16 MED ORDER — TRAZODONE HCL 50 MG PO TABS: 50.0000 mg | ORAL_TABLET | Freq: Every day | ORAL | Status: DC

## 2013-12-16 MED ORDER — SERTRALINE HCL 100 MG PO TABS: 150.0000 mg | ORAL_TABLET | Freq: Every day | ORAL | Status: DC

## 2013-12-16 NOTE — Progress Notes (Signed)
Patient ID: Cheryl Cross, female   DOB: April 16, 1966, 48 y.o.   MRN: [id number] Patient ID: Cheryl Cross, female   DOB: 1966/08/07, 48 y.o.   MRN: [id number] [facility] MD Progress Note [id number] Cheryl Cross  MRN:  [id number] DOB:: July 08, 1966 Age: 48 y.o. Date: 12/16/2013  Chief Complaint: Chief Complaint  Patient presents with  . Anxiety  . Depression  . Follow-up   History of present illness This patient is a 48 year old separated white female who lives alone in [place]. She is a Pharmacist, hospital at [employer].  The patient stated that last year in May she got extremely depressed and became suicidal. She had back pain and fibromyalgia. She got hooked on taking too many Xanax and was sleeping all the time and unable to function. She was hospitalized at behavioral health hospital and got off the Xanax and since then has been on trazodone and Zoloft. 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 She sees Dr. Jefm Miles here which she finds very helpful. She denies being depressed and stays on a good regimen of eating and sleeping. Her mood is generally been good and she's not significantly anxious. She's not abusing any substances and she denies suicidal ideation  The patient returns after 3 months. She continues to do very well. Her mood is been stable. She has had some stomach problems and was seen by her primary Dr. Her abdominal ultrasound was normal but she will be referred to a GI specialist. She's had a long-term history of irritable bowel syndrome. She's been sleeping well and is enjoying her work. She's not had any relapses a benzodiazepine abuse  Diagnosis:   Axis I: Major Depression, Recurrent severe, Substance Abuse and Substance Induced Mood Disorder Axis II: Deferred Axis III:  Past Medical History  Diagnosis Date  . Polycythemia vera(238.4) 12/07/2011  . Fibromyalgia   . Polycythemia vera(238.4)   . Bursitis      hips bilat  . Arthritis     back   . DDD (degenerative disc disease)   . Overactive bladder   . Depression   . PTSD (post-traumatic stress disorder)   . Anxiety   . Polycythemia vera(238.4)   . Obsessive-compulsive disorder    Axis IV: other psychosocial or environmental problems Axis V: 51-60 moderate symptoms  ADL's:  Intact  Sleep: Good, thanks to Trazodone  Appetite:  Fair  Suicidal Ideation:  Pt denies any thoughts, plans, intent of suicide\ Homicidal Ideation:  Pt denies any thoughts, plans, intent of homicide  AEB (as evidenced by):per pt report  Psychiatric Specialty Exam: ROS  Height 5\' 7"  (1.702 m), weight 153 lb (69.4 kg).Body mass index is 23.96 kg/(m^2).  General Appearance: Casual  Eye Contact::  Good  Speech:  Clear and Coherent  Volume:  Normal  Mood:  Anxious and   Affect:  Congruent  Thought Process:  Coherent, Linear and Logical  Orientation:  Full (Time, Place, and Person)  Thought Content:  WDL  Suicidal Thoughts:  No  Homicidal Thoughts:  No  Memory:  Immediate;   Good Recent;   Good Remote;   Good  Judgement:  Good  Insight:  Good  Psychomotor Activity:  TYpical  Concentration:  Good  Recall:  Good  Akathisia:  No  Handed:  Right  AIMS (if indicated):     Assets:  Communication Skills Desire for Improvement  Sleep:      Current Medications: Current Outpatient Prescriptions  Medication Sig Dispense  Refill  . aspirin 81 MG tablet Take 1 tablet (81 mg total) by mouth daily. For Polycythemia Vera.      Marland Kitchen LIDODERM 5 % Place 1 patch onto the skin once a week.       . sertraline (ZOLOFT) 100 MG tablet Take 1.5 tablets (150 mg total) by mouth daily.  45 tablet  2  . traZODone (DESYREL) 50 MG tablet Take 1 tablet (50 mg total) by mouth at bedtime. For sleep.  30 tablet  2   No current facility-administered medications for this visit.   Lab Results:  Results for orders placed during the hospital encounter of 09/22/13 (from the past  8736 hour(s))  CBC   Collection Time    09/22/13 10:17 AM      Result Value Range   WBC 6.3  4.0 - 10.5 K/uL   RBC 4.50  3.87 - 5.11 MIL/uL   Hemoglobin 14.0  12.0 - 15.0 g/dL   HCT 41.9  37.9 - 02.4 %   MCV 90.9  78.0 - 100.0 fL   MCH 31.1  26.0 - 34.0 pg   MCHC 34.2  30.0 - 36.0 g/dL   RDW 09.7  35.3 - 29.9 %   Platelets 324  150 - 400 K/uL  Results for orders placed in visit on 06/05/13 (from the past 8736 hour(s))  CBC WITH DIFFERENTIAL   Collection Time    06/05/13  1:27 PM      Result Value Range   WBC 8.3  3.9 - 10.3 10e3/uL   NEUT# 5.2  1.5 - 6.5 10e3/uL   HGB 12.8  11.6 - 15.9 g/dL   HCT 24.2  68.3 - 41.9 %   Platelets 317  145 - 400 10e3/uL   MCV 89.6  79.5 - 101.0 fL   MCH 29.8  25.1 - 34.0 pg   MCHC 33.2  31.5 - 36.0 g/dL   RBC 6.22  2.97 - 9.89 10e6/uL   RDW 14.4  11.2 - 14.5 %   lymph# 2.3  0.9 - 3.3 10e3/uL   MONO# 0.6  0.1 - 0.9 10e3/uL   Eosinophils Absolute 0.2  0.0 - 0.5 10e3/uL   Basophils Absolute 0.1  0.0 - 0.1 10e3/uL   NEUT% 62.7  38.4 - 76.8 %   LYMPH% 27.2  14.0 - 49.7 %   MONO% 6.8  0.0 - 14.0 %   EOS% 2.4  0.0 - 7.0 %   BASO% 0.9  0.0 - 2.0 %  LACTATE DEHYDROGENASE (CC13)   Collection Time    06/05/13  1:27 PM      Result Value Range   LDH 154  125 - 245 U/L  COMPREHENSIVE METABOLIC PANEL (CC13)   Collection Time    06/05/13  1:27 PM      Result Value Range   Sodium 139  136 - 145 mEq/L   Potassium 4.0  3.5 - 5.1 mEq/L   Chloride 106  98 - 109 mEq/L   CO2 26  22 - 29 mEq/L   Glucose 98  70 - 140 mg/dl   BUN 4.2 (*) 7.0 - 21.1 mg/dL   Creatinine 0.8  0.6 - 1.1 mg/dL   Total Bilirubin 9.41  0.20 - 1.20 mg/dL   Alkaline Phosphatase 63  40 - 150 U/L   AST 7  5 - 34 U/L   ALT <6 Repeated and Verified  0 - 55 U/L   Total Protein 6.7  6.4 - 8.3 g/dL  Albumin 3.8  3.5 - 5.0 g/dL   Calcium 9.2  8.4 - 10.4 mg/dL  FERRITIN [facility]   Collection Time    06/05/13  1:27 PM      Result Value Range   Ferritin 9  9 - 269 ng/ml  Results for  orders placed during the hospital encounter of 03/27/13 (from the past 8736 hour(s))  FERRITIN   Collection Time    03/27/13  1:45 PM      Result Value Range   Ferritin 6 (*) 10 - 291 ng/mL  CBC   Collection Time    03/27/13  1:45 PM      Result Value Range   WBC 7.1  4.0 - 10.5 K/uL   RBC 4.50  3.87 - 5.11 MIL/uL   Hemoglobin 13.5  12.0 - 15.0 g/dL   HCT 39.9  36.0 - 46.0 %   MCV 88.7  78.0 - 100.0 fL   MCH 30.0  26.0 - 34.0 pg   MCHC 33.8  30.0 - 36.0 g/dL   RDW 14.0  11.5 - 15.5 %   Platelets 333  150 - 400 K/uL  DIFFERENTIAL   Collection Time    03/27/13  1:45 PM      Result Value Range   Neutrophils Relative % 62  43 - 77 %   Neutro Abs 4.4  1.7 - 7.7 K/uL   Lymphocytes Relative 31  12 - 46 %   Lymphs Abs 2.2  0.7 - 4.0 K/uL   Monocytes Relative 6  3 - 12 %   Monocytes Absolute 0.4  0.1 - 1.0 K/uL   Eosinophils Relative 1  0 - 5 %   Eosinophils Absolute 0.1  0.0 - 0.7 K/uL   Basophils Relative 1  0 - 1 %   Basophils Absolute 0.0  0.0 - 0.1 K/uL   Physical Findings: AIMS:  , ,  ,  ,    CIWA:    COWS:     Treatment Plan Summary: Medication management  Plan: I will continue Zoloft and trazodone at present dose.  Patient at this time doesn't have any side effects.  Recommend to call us back if she has any question concerns or if she feels worsening symptoms.  Followup in 3 months.  MEDICATIONS this encounter: Meds ordered this encounter  Medications  . sertraline (ZOLOFT) 100 MG tablet    Sig: Take 1.5 tablets (150 mg total) by mouth daily.    Dispense:  45 tablet    Refill:  2  . traZODone (DESYREL) 50 MG tablet    Sig: Take 1 tablet (50 mg total) by mouth at bedtime. For sleep.    Dispense:  30 tablet    Refill:  2    Medical Decision Making Problem Points:  Established problem, stable/improving (1) and Review of last therapy session (1) Data Points:  Review or order clinical lab tests (1) Review of medication regiment & side effects (2)  Cheryl Cross,  [place] 12/16/2013, 4:47 PM

## 2014-01-11 ENCOUNTER — Encounter: Payer: Self-pay | Admitting: Oncology

## 2014-01-12 ENCOUNTER — Encounter (INDEPENDENT_AMBULATORY_CARE_PROVIDER_SITE_OTHER): Payer: Self-pay | Admitting: Internal Medicine

## 2014-01-12 ENCOUNTER — Ambulatory Visit (INDEPENDENT_AMBULATORY_CARE_PROVIDER_SITE_OTHER): Payer: BC Managed Care – PPO | Admitting: Internal Medicine

## 2014-01-12 VITALS — BP 118/70 | HR 64 | Temp 98.6°F | Ht 67.0 in | Wt 153.5 lb

## 2014-01-12 DIAGNOSIS — G8929 Other chronic pain: Secondary | ICD-10-CM

## 2014-01-12 DIAGNOSIS — K589 Irritable bowel syndrome without diarrhea: Secondary | ICD-10-CM

## 2014-01-12 DIAGNOSIS — R1013 Epigastric pain: Secondary | ICD-10-CM

## 2014-01-12 MED ORDER — PANTOPRAZOLE SODIUM 40 MG PO TBEC: 40.0000 mg | DELAYED_RELEASE_TABLET | Freq: Every day | ORAL | Status: DC

## 2014-01-12 MED ORDER — DICYCLOMINE HCL 10 MG PO CAPS: 10.0000 mg | ORAL_CAPSULE | Freq: Three times a day (TID) | ORAL | Status: DC

## 2014-01-12 NOTE — Progress Notes (Signed)
Subjective:     Patient ID: Cheryl Cross, female   DOB: 06-28-1966, 48 y.o.   MRN: [id number]  HPI Referred to our office by Dr. Orson Ape at [facility] for IBS.  She has seen ?GI Dr. In [place] in the past for same symptoms (Dr. Deatra Ina). She underwent an EGD and was normal (abdominal burning sensation). She also had a colonoscopy which was normal for c/o diarrhea. (Please see below). She also has had a Given's capsule which was normal looking for Crohn's. She c/o bloating and a burning sensation in her mid abdomen and sometimes it will radiate into her back.  In the past few weeks she will have waves of nausea. The symptoms are sporadic. She was taking Prilosec but stopped because it did  not help.  Appetite is okay. No weight loss. She has some burning in her mid abdomen. She will have a BM 2-3 a day. No melena or bright red rectal bleeding. She has urgency at times.  Seen at [facility] for Depression. 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   11/30/2013 US abdomen: CBD 3.61mm IMPRESSION:  1. The liver, gallbladder, common bile duct, and spleen are normal  in appearance.  2. Neither kidney exhibits evidence of obstruction. There is a 7 mm  diameter hyperechoic focus which may reflect a nonobstructing stone  or parenchymal calcification in the midpole of the left kidney.   PROCEDURE DATE: 03/30/2010  PROCEDURE: EGD, diagnostic  ASA CLASS: Class I  INDICATIONS: abdominal pain despite treatment Pt has h/o  moderately severe burning epigastric pain unresponsive to PPI  therapy including Nexium, which made her diarrhea worse. Pt has  h/o IBS/diarrhea treated successfully with Lotronex   ENDOSCOPIC IMPRESSION:  1) Normal EGD   02/23/2010 Colonoscopy:  ENDOSCOPIC IMPRESSION:  1) Normal colon  RECOMMENDATIONS:  1) Await biopsy results  2) call office next 1-3 days to schedule followup visit in 2  weeks Biopsy:  1. COLON, BIOPSY, RANDOM : BENIGN COLONIC  MUCOSA. NO MICROSCOPIC COLITIS, ACTIVE INFLAMMATION OR GRANULOMAS.      Review of Systems Past Medical History  Diagnosis Date  . Polycythemia vera(238.4) 12/07/2011  . Fibromyalgia   . Polycythemia vera(238.4)   . Bursitis     hips bilat  . Arthritis     back   . DDD (degenerative disc disease)   . Overactive bladder   . Depression   . PTSD (post-traumatic stress disorder)   . Anxiety   . Polycythemia vera(238.4)   . Obsessive-compulsive disorder     Past Surgical History  Procedure Laterality Date  . Abdominal hysterectomy    . Lumbar fusion      Allergies  Allergen Reactions  . Cyclobenzaprine     Hot sensation  . Diclofenac Sodium     REACTION: Bad itch  . Gabapentin Hives  . Penicillins     unknown  . Robaxin [Methocarbamol] Hives  . Voltaren [Diclofenac Sodium] Itching    Current Outpatient Prescriptions on File Prior to Visit  Medication Sig Dispense Refill  . aspirin 81 MG tablet Take 1 tablet (81 mg total) by mouth daily. For Polycythemia Vera.      Marland Kitchen LIDODERM 5 % Place 1 patch onto the skin once a week.       . sertraline (ZOLOFT) 100 MG tablet Take 1.5 tablets (150 mg total) by mouth daily.  45 tablet  2  . traZODone (DESYREL) 50 MG tablet Take 1 tablet (50 mg total) by mouth  at bedtime. For sleep.  30 tablet  2   No current facility-administered medications on file prior to visit.        Objective:   Physical Exam  Filed Vitals:   01/12/14 1612  BP: 118/70  Pulse: 64  Temp: 98.6 F (37 C)  Height: 5\' 7"  (1.702 m)  Weight: 153 lb 8 oz (69.627 kg)   Alert and oriented. Skin warm and dry. Oral mucosa is moist.   . Sclera anicteric, conjunctivae is pink. Thyroid not enlarged. No cervical lymphadenopathy. Lungs clear. Heart regular rate and rhythm.  Abdomen is soft. Bowel sounds are positive. No hepatomegaly. No abdominal masses felt. Slight tenderness diffuse.  No edema to lower extremities.       Assessment:    IBS. She has had an  extensive workup by Dr. Deatra Ina which all were normal.  GERD: Will try on Protonix and see how she does. Possible gallbladder disease.     Plan:    Protonix 40mg  daily. Dicyclomine 10mg  TID. HIDA scan will be scheduled.  PR in 2 weeks. I

## 2014-01-12 NOTE — Patient Instructions (Signed)
Protonix 40mg  daily. Dicyclomine 10mg  three times a day. PR in 2 weeks.

## 2014-01-18 ENCOUNTER — Encounter (HOSPITAL_COMMUNITY)
Admission: RE | Admit: 2014-01-18 | Discharge: 2014-01-18 | Disposition: A | Discharge status: Home / Self Care | Payer: BC Managed Care – PPO | Source: Ambulatory Visit | Attending: Internal Medicine | Admitting: Internal Medicine

## 2014-01-18 ENCOUNTER — Encounter (HOSPITAL_COMMUNITY): Payer: Self-pay

## 2014-01-18 DIAGNOSIS — G8929 Other chronic pain: Secondary | ICD-10-CM

## 2014-01-18 DIAGNOSIS — R1013 Epigastric pain: Secondary | ICD-10-CM | POA: Insufficient documentation

## 2014-01-18 IMAGING — NM NM HEPATO W/GB/PHARM/[PERSON_NAME]
2 series · 12 of 12 positions shown · non-contrast
Comparison: None

RADIOPHARMACEUTICALS:  5 mCi [00] Choletec

CLINICAL DATA: Chronic epigastric abdominal pain

EXAM:
NUCLEAR MEDICINE HEPATOBILIARY IMAGING WITH GALLBLADDER EF
TECHNIQUE: Sequential images of the abdomen were obtained [DATE] minutes
following intravenous administration of radiopharmaceutical. After
slow intravenous infusion of 1.39 micrograms Cholecystokinin,
gallbladder ejection fraction was determined.

[hida · 3.20mm/px · 6 of 30 frames shown (1 of 2)]
[frame 3/30]
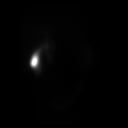
[frame 8/30]
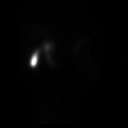
[frame 13/30]
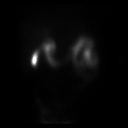
[frame 18/30]
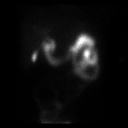
[frame 23/30]
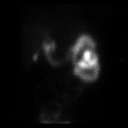
[frame 28/30]
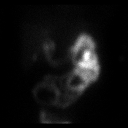

[hida · 3.20mm/px · 6 of 60 frames shown (2 of 2)]
[frame 6/60]
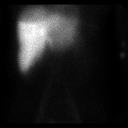
[frame 16/60]
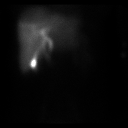
[frame 26/60]
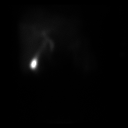
[frame 36/60]
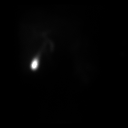
[frame 46/60]
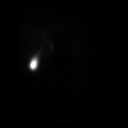
[frame 56/60]
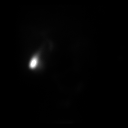

[12 of 12 positions shown; findings below may reference images not displayed]

FINDINGS: Normal tracer extraction from bloodstream indicating normal
hepatocellular function.

Prompt excretion of tracer into biliary tree.

Gallbladder visualized at 10 min.

Small bowel tracer visualized at 16 min.

No hepatic retention of tracer.

Following CCK stimulation common normal emptying of tracer from the
gallbladder is visualized.

Calculated gallbladder ejection fraction is under percent, normal.

Patient experienced abdominal discomfort following CCK.
IMPRESSION: Patent biliary tree.

Normal gallbladder response to CCK stimulation with normal
gallbladder ejection fraction of 100%.

Abdominal discomfort following CCK.

## 2014-01-18 MED ORDER — STERILE WATER FOR INJECTION IJ SOLN
INTRAMUSCULAR | Status: AC
  Administered 2014-01-18: 1.39 mL via INTRAVENOUS
  Filled 2014-01-18: qty 10

## 2014-01-18 MED ORDER — SINCALIDE 5 MCG IJ SOLR
INTRAMUSCULAR | Status: AC
  Administered 2014-01-18: 1.39 ug via INTRAVENOUS
  Filled 2014-01-18: qty 5

## 2014-01-18 MED ORDER — TECHNETIUM TC 99M MEBROFENIN IV KIT
5.0000 | PACK | Freq: Once | INTRAVENOUS | Status: AC | PRN
Start: 2014-01-18 — End: 2014-01-18
  Administered 2014-01-18: 5 via INTRAVENOUS

## 2014-01-20 ENCOUNTER — Telehealth (INDEPENDENT_AMBULATORY_CARE_PROVIDER_SITE_OTHER): Payer: Self-pay | Admitting: *Deleted

## 2014-01-20 NOTE — Telephone Encounter (Signed)
Would like test results, please call @ [phone] -- just leave her a message

## 2014-01-20 NOTE — Telephone Encounter (Signed)
I have left her a message.

## 2014-01-21 ENCOUNTER — Encounter (INDEPENDENT_AMBULATORY_CARE_PROVIDER_SITE_OTHER): Payer: Self-pay | Admitting: Internal Medicine

## 2014-01-22 ENCOUNTER — Encounter (HOSPITAL_COMMUNITY): Payer: BC Managed Care – PPO

## 2014-02-08 ENCOUNTER — Ambulatory Visit: Payer: Self-pay

## 2014-02-10 ENCOUNTER — Other Ambulatory Visit (INDEPENDENT_AMBULATORY_CARE_PROVIDER_SITE_OTHER): Payer: Self-pay | Admitting: Internal Medicine

## 2014-02-10 ENCOUNTER — Other Ambulatory Visit: Payer: Self-pay | Admitting: Oncology

## 2014-02-10 ENCOUNTER — Encounter: Payer: Self-pay | Admitting: Oncology

## 2014-02-10 DIAGNOSIS — D45 Polycythemia vera: Secondary | ICD-10-CM

## 2014-02-10 DIAGNOSIS — G8929 Other chronic pain: Secondary | ICD-10-CM

## 2014-02-10 DIAGNOSIS — R1013 Epigastric pain: Principal | ICD-10-CM

## 2014-02-10 MED ORDER — DICYCLOMINE HCL 10 MG PO CAPS: 10.0000 mg | ORAL_CAPSULE | Freq: Three times a day (TID) | ORAL | Status: DC

## 2014-02-11 ENCOUNTER — Encounter (HOSPITAL_COMMUNITY): Payer: Self-pay

## 2014-02-11 ENCOUNTER — Encounter (HOSPITAL_COMMUNITY)
Admission: RE | Admit: 2014-02-11 | Discharge: 2014-02-11 | Disposition: A | Discharge status: Home / Self Care | Payer: BC Managed Care – PPO | Source: Ambulatory Visit | Attending: Oncology | Admitting: Oncology

## 2014-02-11 ENCOUNTER — Telehealth: Payer: Self-pay | Admitting: *Deleted

## 2014-02-11 ENCOUNTER — Ambulatory Visit
Admission: RE | Admit: 2014-02-11 | Discharge: 2014-02-11 | Disposition: A | Discharge status: Home / Self Care | Payer: BC Managed Care – PPO | Source: Ambulatory Visit

## 2014-02-11 VITALS — BP 117/78 | HR 75 | Temp 98.8°F | Resp 16 | Ht 67.0 in | Wt 150.0 lb

## 2014-02-11 DIAGNOSIS — Z1231 Encounter for screening mammogram for malignant neoplasm of breast: Secondary | ICD-10-CM

## 2014-02-11 DIAGNOSIS — D45 Polycythemia vera: Secondary | ICD-10-CM | POA: Insufficient documentation

## 2014-02-11 LAB — CBC
HCT: 38.3 % (ref 36.0–46.0)
HEMOGLOBIN: 13 g/dL (ref 12.0–15.0)
MCH: 30.4 pg (ref 26.0–34.0)
MCHC: 33.9 g/dL (ref 30.0–36.0)
MCV: 89.5 fL (ref 78.0–100.0)
PLATELETS: 281 10*3/uL (ref 150–400)
RBC: 4.28 MIL/uL (ref 3.87–5.11)
RDW: 13.5 % (ref 11.5–15.5)
WBC: 6.5 10*3/uL (ref 4.0–10.5)

## 2014-02-11 IMAGING — MG MM SCREEN MAMMOGRAM BILATERAL
4 series · 4 of 4 positions shown · non-contrast
Comparison: Previous exam(s).

CLINICAL DATA: Screening.

EXAM:
DIGITAL SCREENING BILATERAL MAMMOGRAM WITH CAD

[R CC]
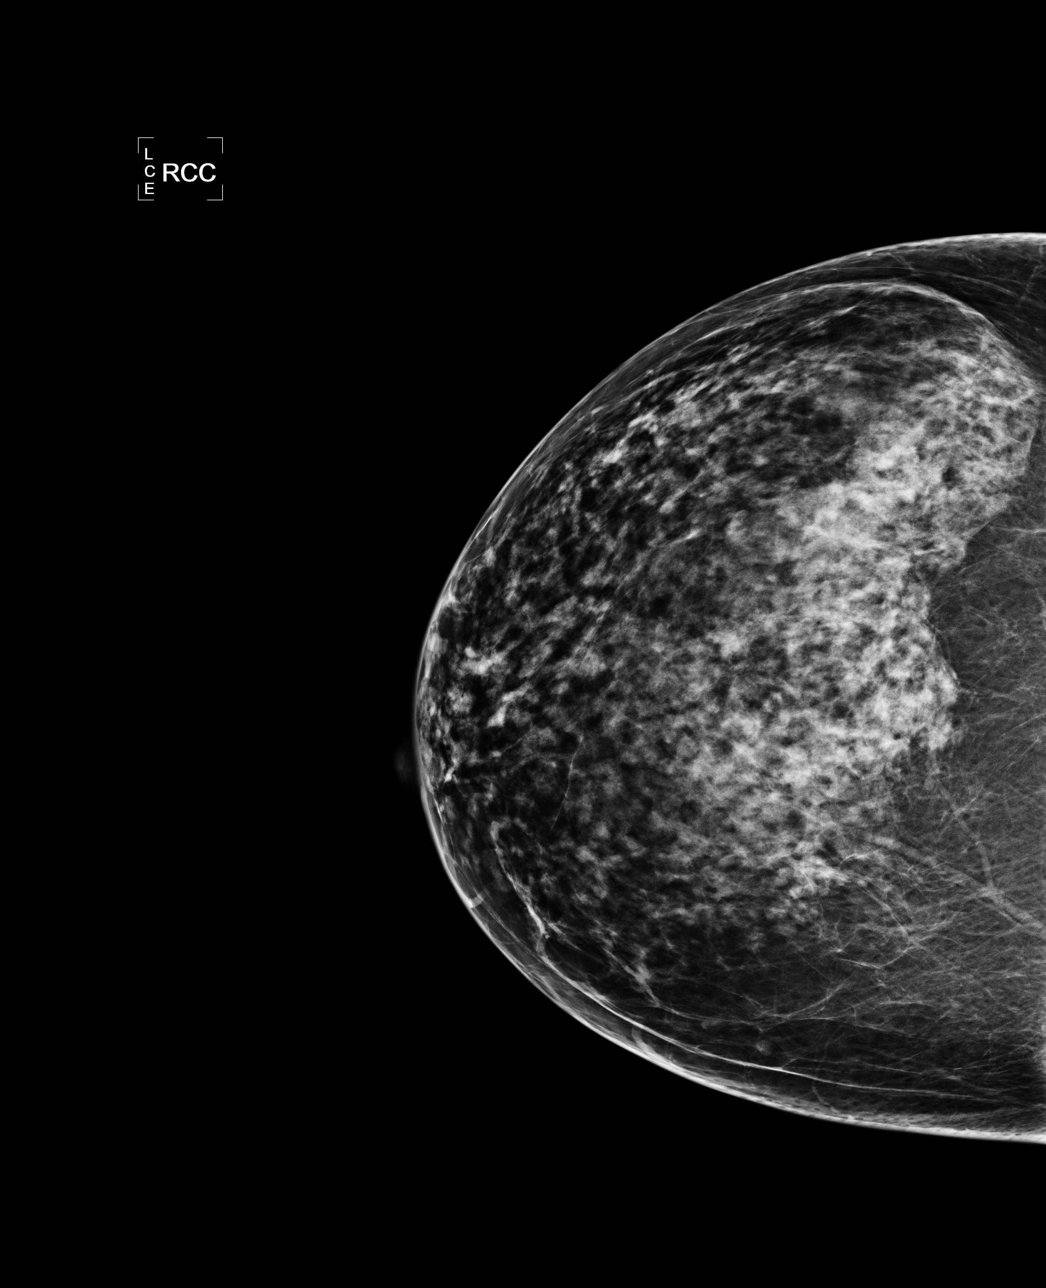

[L CC]
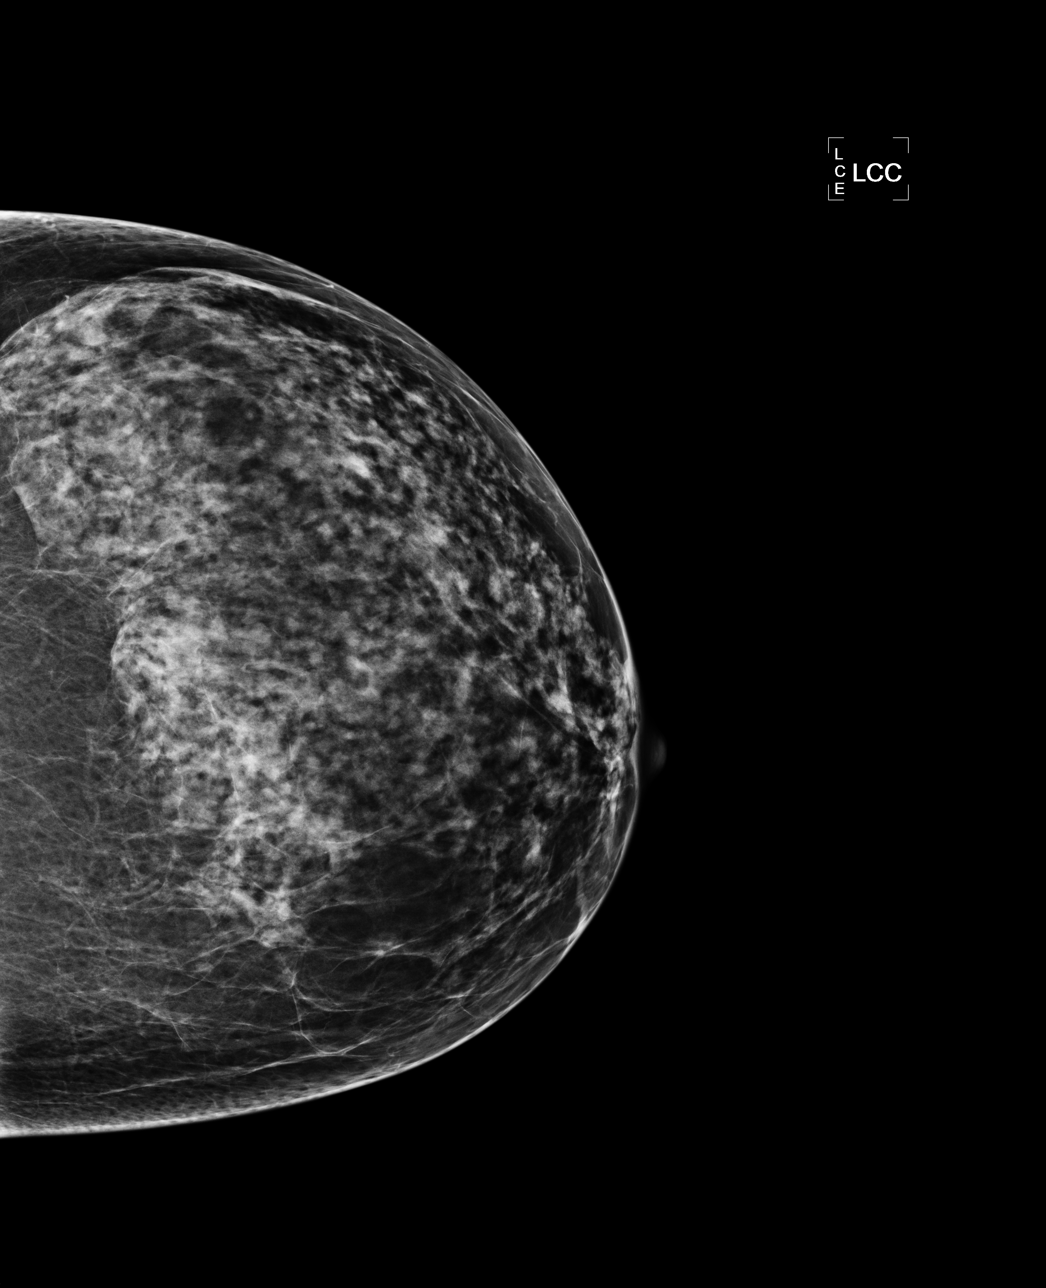

[L MLO]
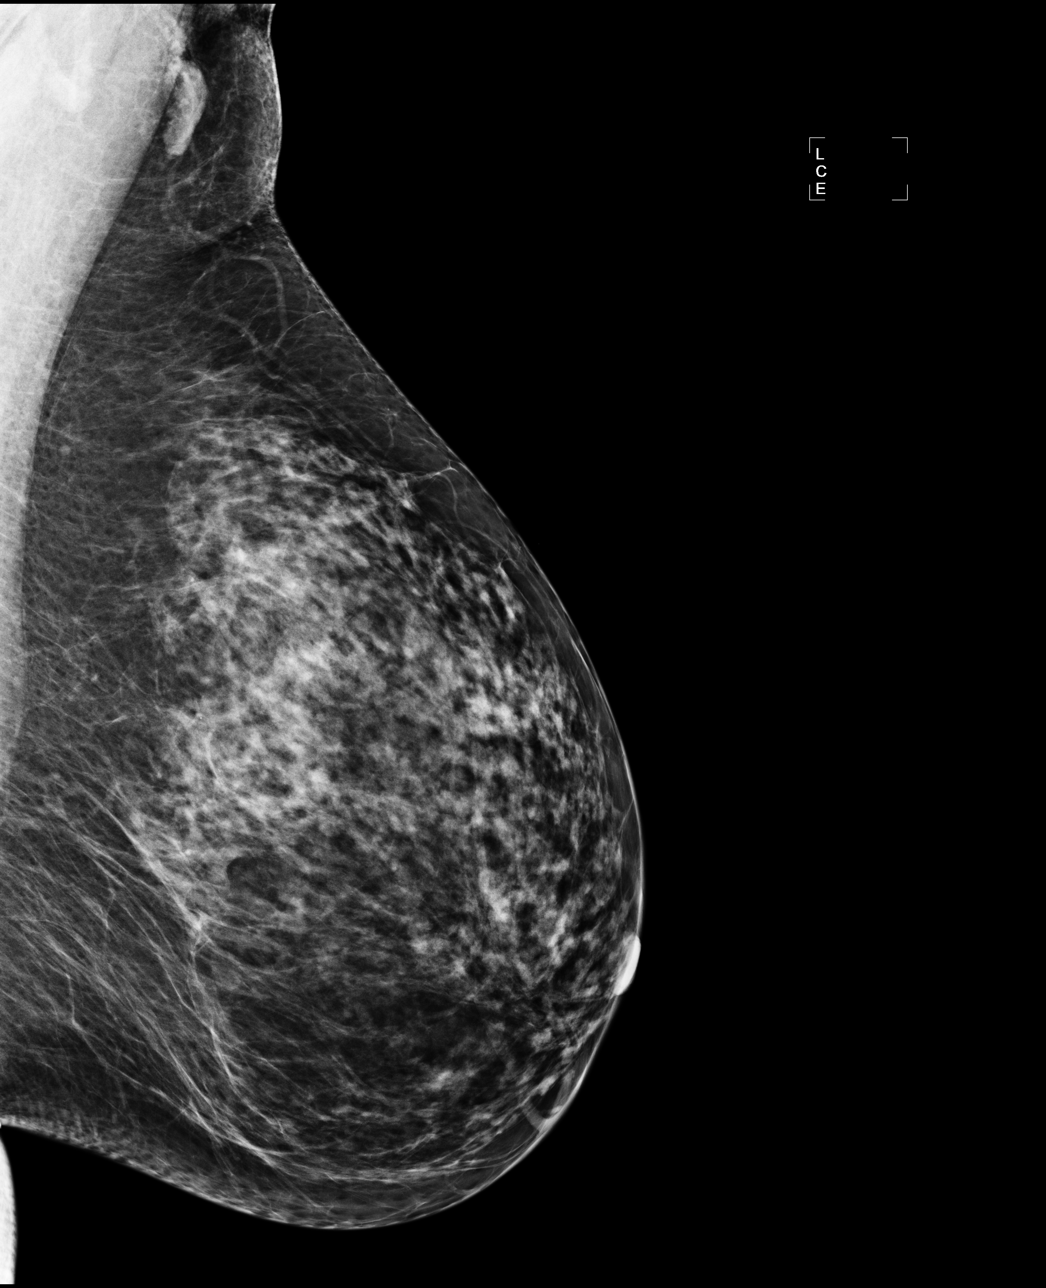

[R MLO]
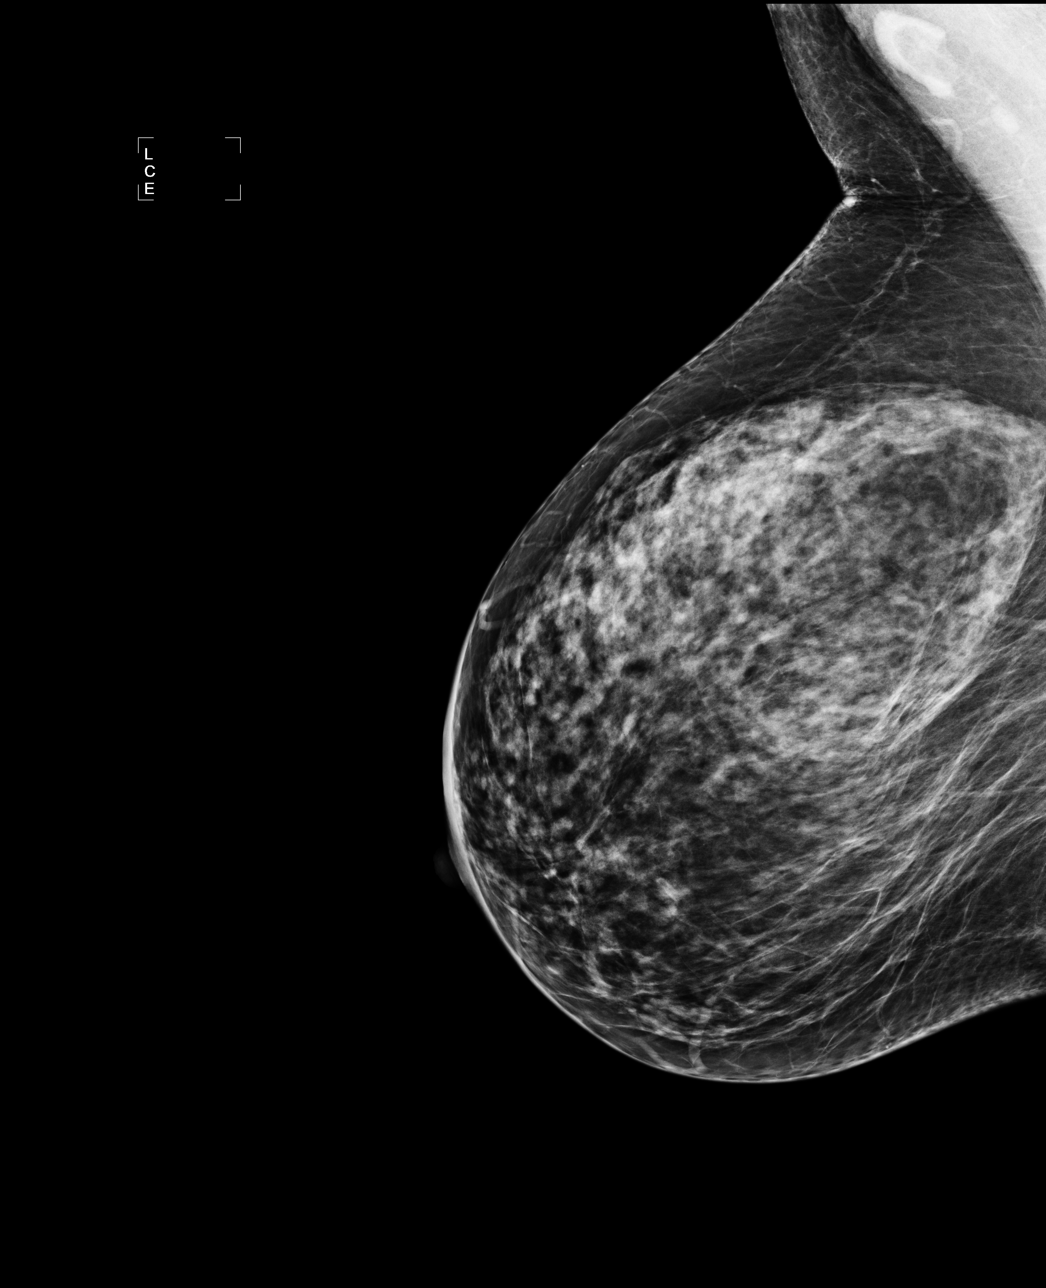

[4 of 4 positions shown; findings below may reference images not displayed]

ACR Breast Density Category c: The breast tissue is heterogeneously
dense, which may obscure small masses.
FINDINGS: There are no findings suspicious for malignancy. Images were
processed with CAD.
IMPRESSION: No mammographic evidence of malignancy. A result letter of this
screening mammogram will be mailed directly to the patient.

RECOMMENDATION:
Screening mammogram in one year. (Code:[0J])

BI-RADS CATEGORY  1: Negative.

## 2014-02-11 NOTE — Procedures (Signed)
Hgb 13 and therapeutic phlebotomy was preformed. 500 cc total was obtained. Able to obtain 175 cc via site right wrist then and then it stopped. I then attempted IV site left post upper forearm unsuccessfully then right a/c in which I was able to obtain the 325cc total procedure was 1 hour.  At the end of the procedure pt declined to stay and she declined anything to eat or drink. Pt voiced no c/o and IV sites were dc'd and pt was discharged ambulatory unaccompanied to main lobby.

## 2014-02-11 NOTE — Discharge Instructions (Signed)

## 2014-02-11 NOTE — Telephone Encounter (Signed)
Received call from Kinney Stay to call patient about her next appointment.  Called patient and let her know to follow directions in letter she received.  She should call after Monday 4/6 to call for an August appt. With Dr. Beryle Beams.  It would be good to see him before she is scheduled for her next phlebotomy on 06/17/14, so he can put the appropriate orders in, if needed.

## 2014-03-15 ENCOUNTER — Encounter (HOSPITAL_COMMUNITY): Payer: Self-pay | Admitting: Psychiatry

## 2014-03-15 ENCOUNTER — Ambulatory Visit (INDEPENDENT_AMBULATORY_CARE_PROVIDER_SITE_OTHER): Payer: BC Managed Care – PPO | Admitting: Psychiatry

## 2014-03-15 VITALS — BP 120/80 | Ht 67.0 in | Wt 154.0 lb

## 2014-03-15 DIAGNOSIS — F329 Major depressive disorder, single episode, unspecified: Secondary | ICD-10-CM

## 2014-03-15 DIAGNOSIS — F339 Major depressive disorder, recurrent, unspecified: Secondary | ICD-10-CM

## 2014-03-15 DIAGNOSIS — F418 Other specified anxiety disorders: Secondary | ICD-10-CM

## 2014-03-15 DIAGNOSIS — F1910 Other psychoactive substance abuse, uncomplicated: Secondary | ICD-10-CM

## 2014-03-15 DIAGNOSIS — F1994 Other psychoactive substance use, unspecified with psychoactive substance-induced mood disorder: Secondary | ICD-10-CM

## 2014-03-15 DIAGNOSIS — F429 Obsessive-compulsive disorder, unspecified: Secondary | ICD-10-CM

## 2014-03-15 DIAGNOSIS — F5105 Insomnia due to other mental disorder: Secondary | ICD-10-CM

## 2014-03-15 MED ORDER — SERTRALINE HCL 100 MG PO TABS: 150.0000 mg | ORAL_TABLET | Freq: Every day | ORAL | Status: DC

## 2014-03-15 MED ORDER — TRAZODONE HCL 50 MG PO TABS: 50.0000 mg | ORAL_TABLET | Freq: Every day | ORAL | Status: DC

## 2014-03-15 NOTE — Progress Notes (Signed)
Patient ID: AKAYSHA COBERN, female   DOB: [birth year], 48 y.o.   MRN: [id number] Patient ID: HADDY MULLINAX, female   DOB: 1966/03/21, 48 y.o.   MRN: [id number] Patient ID: JALAYIA BAGHERI, female   DOB: 13-Dec-1965, 48 y.o.   MRN: [id number] [facility] MD Progress Note [id number] NAKETA DADDARIO  MRN:  [id number] DOB:: 1966-04-28 Age: 48 y.o. Date: 03/15/2014  Chief Complaint: Chief Complaint  Patient presents with  . Anxiety  . Depression  . Follow-up   History of present illness This patient is a 48 year old separated white female who lives alone in [place]. She is a Pharmacist, hospital at [employer].  The patient stated that last year in May she got extremely depressed and became suicidal. She had back pain and fibromyalgia. She got hooked on taking too many Xanax and was sleeping all the time and unable to function. [facility] and got off the Xanax and since then has been on trazodone and Zoloft. 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 She sees Dr. Jefm Miles here which she finds very helpful. She denies being depressed and stays on a good regimen of eating and sleeping. Her mood is generally been good and she's not significantly anxious. She's not abusing any substances and she denies suicidal ideation  The patient returns after 3 months. She continues to do very well. Her mood is been stable. She would like to cut down her trazodone because she is somewhat groggy in the mornings. She does not have any significant depression or suicidal ideation.  Diagnosis:   Axis I: Major Depression, Recurrent severe, Substance Abuse and Substance Induced Mood Disorder Axis II: Deferred Axis III:  Past Medical History  Diagnosis Date  . Polycythemia vera(238.4) 12/07/2011  . Fibromyalgia   . Polycythemia vera(238.4)   . Bursitis     hips bilat  . Arthritis     back   . DDD (degenerative disc  disease)   . Overactive bladder   . Depression   . PTSD (post-traumatic stress disorder)   . Anxiety   . Polycythemia vera(238.4)   . Obsessive-compulsive disorder    Axis IV: other psychosocial or environmental problems Axis V: 51-60 moderate symptoms  ADL's:  Intact  Sleep: Good, thanks to Trazodone  Appetite:  Fair  Suicidal Ideation:  Pt denies any thoughts, plans, intent of suicide\ Homicidal Ideation:  Pt denies any thoughts, plans, intent of homicide  AEB (as evidenced by):per pt report  Psychiatric Specialty Exam: ROS  Blood pressure 120/80, height 5\' 7"  (1.702 m), weight 154 lb (69.854 kg).Body mass index is 24.11 kg/(m^2).  General Appearance: Casual  Eye Contact::  Good  Speech:  Clear and Coherent  Volume:  Normal  Mood: good  Affect:  Congruent  Thought Process:  Coherent, Linear and Logical  Orientation:  Full (Time, Place, and Person)  Thought Content:  WDL  Suicidal Thoughts:  No  Homicidal Thoughts:  No  Memory:  Immediate;   Good Recent;   Good Remote;   Good  Judgement:  Good  Insight:  Good  Psychomotor Activity:  TYpical  Concentration:  Good  Recall:  Good  Akathisia:  No  Handed:  Right  AIMS (if indicated):     Assets:  Communication Skills Desire for Improvement  Sleep:      Current Medications: Current Outpatient Prescriptions  Medication Sig Dispense Refill  . aspirin 81 MG tablet Take 1 tablet (81  mg total) by mouth daily. For Polycythemia Vera.      . dicyclomine (BENTYL) 10 MG capsule Take 1 capsule (10 mg total) by mouth 3 (three) times daily before meals.  90 capsule  2  . LIDODERM 5 % Place 1 patch onto the skin once a week.       . pantoprazole (PROTONIX) 40 MG tablet Take 1 tablet (40 mg total) by mouth daily.  90 tablet  3  . sertraline (ZOLOFT) 100 MG tablet Take 1.5 tablets (150 mg total) by mouth daily.  45 tablet  2  . traZODone (DESYREL) 50 MG tablet Take 1 tablet (50 mg total) by mouth at bedtime. For sleep.  30  tablet  2   No current facility-administered medications for this visit.   Lab Results:  Results for orders placed during the hospital encounter of 02/11/14 (from the past 8736 hour(s))  CBC   Collection Time    02/11/14  1:20 PM      Result Value Ref Range   WBC 6.5  4.0 - 10.5 K/uL   RBC 4.28  3.87 - 5.11 MIL/uL   Hemoglobin 13.0  12.0 - 15.0 g/dL   HCT 38.3  36.0 - 46.0 %   MCV 89.5  78.0 - 100.0 fL   MCH 30.4  26.0 - 34.0 pg   MCHC 33.9  30.0 - 36.0 g/dL   RDW 13.5  11.5 - 15.5 %   Platelets 281  150 - 400 K/uL  Results for orders placed during the hospital encounter of 09/22/13 (from the past 8736 hour(s))  CBC   Collection Time    09/22/13 10:17 AM      Result Value Ref Range   WBC 6.3  4.0 - 10.5 K/uL   RBC 4.50  3.87 - 5.11 MIL/uL   Hemoglobin 14.0  12.0 - 15.0 g/dL   HCT 40.9  36.0 - 46.0 %   MCV 90.9  78.0 - 100.0 fL   MCH 31.1  26.0 - 34.0 pg   MCHC 34.2  30.0 - 36.0 g/dL   RDW 14.1  11.5 - 15.5 %   Platelets 324  150 - 400 K/uL  Results for orders placed in visit on 06/05/13 (from the past 8736 hour(s))  CBC WITH DIFFERENTIAL   Collection Time    06/05/13  1:27 PM      Result Value Ref Range   WBC 8.3  3.9 - 10.3 10e3/uL   NEUT# 5.2  1.5 - 6.5 10e3/uL   HGB 12.8  11.6 - 15.9 g/dL   HCT 38.6  34.8 - 46.6 %   Platelets 317  145 - 400 10e3/uL   MCV 89.6  79.5 - 101.0 fL   MCH 29.8  25.1 - 34.0 pg   MCHC 33.2  31.5 - 36.0 g/dL   RBC 4.31  3.70 - 5.45 10e6/uL   RDW 14.4  11.2 - 14.5 %   lymph# 2.3  0.9 - 3.3 10e3/uL   MONO# 0.6  0.1 - 0.9 10e3/uL   Eosinophils Absolute 0.2  0.0 - 0.5 10e3/uL   Basophils Absolute 0.1  0.0 - 0.1 10e3/uL   NEUT% 62.7  38.4 - 76.8 %   LYMPH% 27.2  14.0 - 49.7 %   MONO% 6.8  0.0 - 14.0 %   EOS% 2.4  0.0 - 7.0 %   BASO% 0.9  0.0 - 2.0 %  LACTATE DEHYDROGENASE (CC13)   Collection Time    06/05/13  1:27 PM  Result Value Ref Range   LDH 154  125 - 245 U/L  COMPREHENSIVE METABOLIC PANEL (EN27)   Collection Time     06/05/13  1:27 PM      Result Value Ref Range   Sodium 139  136 - 145 mEq/L   Potassium 4.0  3.5 - 5.1 mEq/L   Chloride 106  98 - 109 mEq/L   CO2 26  22 - 29 mEq/L   Glucose 98  70 - 140 mg/dl   BUN 4.2 (*) 7.0 - 26.0 mg/dL   Creatinine 0.8  0.6 - 1.1 mg/dL   Total Bilirubin 0.29  0.20 - 1.20 mg/dL   Alkaline Phosphatase 63  40 - 150 U/L   AST 7  5 - 34 U/L   ALT <6 Repeated and Verified  0 - 55 U/L   Total Protein 6.7  6.4 - 8.3 g/dL   Albumin 3.8  3.5 - 5.0 g/dL   Calcium 9.2  8.4 - 10.4 mg/dL  FERRITIN [facility]   Collection Time    06/05/13  1:27 PM      Result Value Ref Range   Ferritin 9  9 - 269 ng/ml  Results for orders placed during the hospital encounter of 03/27/13 (from the past 8736 hour(s))  FERRITIN   Collection Time    03/27/13  1:45 PM      Result Value Ref Range   Ferritin 6 (*) 10 - 291 ng/mL  CBC   Collection Time    03/27/13  1:45 PM      Result Value Ref Range   WBC 7.1  4.0 - 10.5 K/uL   RBC 4.50  3.87 - 5.11 MIL/uL   Hemoglobin 13.5  12.0 - 15.0 g/dL   HCT 39.9  36.0 - 46.0 %   MCV 88.7  78.0 - 100.0 fL   MCH 30.0  26.0 - 34.0 pg   MCHC 33.8  30.0 - 36.0 g/dL   RDW 14.0  11.5 - 15.5 %   Platelets 333  150 - 400 K/uL  DIFFERENTIAL   Collection Time    03/27/13  1:45 PM      Result Value Ref Range   Neutrophils Relative % 62  43 - 77 %   Neutro Abs 4.4  1.7 - 7.7 K/uL   Lymphocytes Relative 31  12 - 46 %   Lymphs Abs 2.2  0.7 - 4.0 K/uL   Monocytes Relative 6  3 - 12 %   Monocytes Absolute 0.4  0.1 - 1.0 K/uL   Eosinophils Relative 1  0 - 5 %   Eosinophils Absolute 0.1  0.0 - 0.7 K/uL   Basophils Relative 1  0 - 1 %   Basophils Absolute 0.0  0.0 - 0.1 K/uL   Physical Findings: AIMS:  , ,  ,  ,    CIWA:    COWS:     Treatment Plan Summary: Medication management  Plan: I will continue Zoloft and trazodone can be cut in half to 25 mg if needed Patient at this time doesn't have any side effects.  Recommend to call us back if she has any  question concerns or if she feels worsening symptoms.  Followup in 3 months.  MEDICATIONS this encounter: Meds ordered this encounter  Medications  . sertraline (ZOLOFT) 100 MG tablet    Sig: Take 1.5 tablets (150 mg total) by mouth daily.    Dispense:  45 tablet    Refill:  2  .  traZODone (DESYREL) 50 MG tablet    Sig: Take 1 tablet (50 mg total) by mouth at bedtime. For sleep.    Dispense:  30 tablet    Refill:  2    Medical Decision Making Problem Points:  Established problem, stable/improving (1) and Review of last therapy session (1) Data Points:  Review or order clinical lab tests (1) Review of medication regiment & side effects (2)  [name] 03/15/2014, 4:43 PM

## 2014-04-08 ENCOUNTER — Telehealth (INDEPENDENT_AMBULATORY_CARE_PROVIDER_SITE_OTHER): Payer: Self-pay | Admitting: *Deleted

## 2014-04-08 NOTE — Telephone Encounter (Signed)
PA was done with Katherine[facility] @ [phone]. Approved.Back dates are 03/18/14-04/08/15. Per Belenda Cruise the patient may get a 21 supply with additional refills from both the mail order and the retail pharmacy. I called the [facility] in [place] and gave a verbal order to Pharmacist for the 90 day with refills. The patient will get a letter from her Insurance of the approval.

## 2014-05-26 ENCOUNTER — Ambulatory Visit (HOSPITAL_COMMUNITY): Payer: Self-pay | Admitting: Psychology

## 2014-05-31 ENCOUNTER — Other Ambulatory Visit (INDEPENDENT_AMBULATORY_CARE_PROVIDER_SITE_OTHER): Payer: BC Managed Care – PPO

## 2014-05-31 DIAGNOSIS — D45 Polycythemia vera: Secondary | ICD-10-CM

## 2014-05-31 LAB — CBC WITH DIFFERENTIAL/PLATELET
BASOS PCT: 0 % (ref 0–1)
Basophils Absolute: 0 10*3/uL (ref 0.0–0.1)
EOS ABS: 0.1 10*3/uL (ref 0.0–0.7)
EOS PCT: 2 % (ref 0–5)
HCT: 40.1 % (ref 36.0–46.0)
HEMOGLOBIN: 13.4 g/dL (ref 12.0–15.0)
LYMPHS ABS: 1.7 10*3/uL (ref 0.7–4.0)
Lymphocytes Relative: 28 % (ref 12–46)
MCH: 30.2 pg (ref 26.0–34.0)
MCHC: 33.4 g/dL (ref 30.0–36.0)
MCV: 90.5 fL (ref 78.0–100.0)
MONO ABS: 0.4 10*3/uL (ref 0.1–1.0)
MONOS PCT: 7 % (ref 3–12)
NEUTROS PCT: 63 % (ref 43–77)
Neutro Abs: 3.8 10*3/uL (ref 1.7–7.7)
Platelets: 299 10*3/uL (ref 150–400)
RBC: 4.43 MIL/uL (ref 3.87–5.11)
RDW: 14.4 % (ref 11.5–15.5)
WBC: 6.1 10*3/uL (ref 4.0–10.5)

## 2014-06-01 LAB — COMPLETE METABOLIC PANEL WITH GFR
ALBUMIN: 4.1 g/dL (ref 3.5–5.2)
ALK PHOS: 44 U/L (ref 39–117)
AST: 9 U/L (ref 0–37)
BUN: 3 mg/dL — ABNORMAL LOW (ref 6–23)
CO2: 27 mEq/L (ref 19–32)
Calcium: 8.8 mg/dL (ref 8.4–10.5)
Chloride: 104 mEq/L (ref 96–112)
Creat: 0.57 mg/dL (ref 0.50–1.10)
GFR, Est African American: >89 mL/min
GFR, Est Non African American: >89 mL/min
GLUCOSE: 83 mg/dL (ref 70–99)
Potassium: 3.8 mEq/L (ref 3.5–5.3)
SODIUM: 137 meq/L (ref 135–145)
TOTAL PROTEIN: 6.3 g/dL (ref 6.0–8.3)
Total Bilirubin: 0.4 mg/dL (ref 0.2–1.2)

## 2014-06-01 LAB — FERRITIN: Ferritin: 10 ng/mL (ref 10–291)

## 2014-06-03 ENCOUNTER — Telehealth: Payer: Self-pay | Admitting: *Deleted

## 2014-06-03 NOTE — Telephone Encounter (Signed)
Pt called/ Informed labs are good per Dr Beryle Beams. No questions.

## 2014-06-03 NOTE — Telephone Encounter (Signed)
Message copied by Ebbie Latus on Thu Jun 03, 2014  9:36 AM ------      Message from: Annia Belt      Created: Tue Jun 01, 2014 10:00 AM       Call pt: lab all good ------

## 2014-06-07 ENCOUNTER — Encounter: Payer: Self-pay | Admitting: Oncology

## 2014-06-07 ENCOUNTER — Ambulatory Visit (INDEPENDENT_AMBULATORY_CARE_PROVIDER_SITE_OTHER): Payer: BC Managed Care – PPO | Admitting: Oncology

## 2014-06-07 VITALS — BP 102/70 | HR 65 | Temp 98.1°F | Ht 67.5 in | Wt 155.8 lb

## 2014-06-07 DIAGNOSIS — D45 Polycythemia vera: Secondary | ICD-10-CM

## 2014-06-07 NOTE — Progress Notes (Signed)
Patient ID: Cheryl Cross, female   DOB: 1965/11/25, 48 y.o.   MRN: [id number] Hematology and Oncology Follow Up Visit  Cheryl Cross [id number] [birth year] 48 y.o. 06/07/2014 11:53 AM   Principle Diagnosis: Encounter Diagnosis  Name Primary?  . 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 Extensive evaluation in the past to exclude other causes of polycythemia was unremarkable. Please see my summary note dated 06/14/2013 for additional details. She had persistent and progressive elevation of her hemoglobin associated with constitutional symptoms and was therefore started on a phlebotomy program. She had prompt symptom relief. She is now requiring phlebotomy just every 4 months. Hemoglobin stable at 13 g. We have induced iron deficiency with a phlebotomy program current ferritin is 10. Some of her chronic problems have stabilized. She went through a bout of severe depression. She was having irritable bowel symptoms. She is in a much better frame of mind at this time. She is still on Zoloft and trazodone. She's had no interim medical problems.  Medications: reviewed  Allergies:  Allergies  Allergen Reactions  . Cyclobenzaprine     Hot sensation  . Diclofenac Sodium     REACTION: Bad itch  . Gabapentin Hives  . Penicillins     unknown  . Robaxin [Methocarbamol] Hives  . Voltaren [Diclofenac Sodium] Itching    Review of Systems: Hematology:  No bleeding or bruising ENT ROS: No sore throat Breast ROS:  Respiratory ROS: No cough or dyspnea Cardiovascular ROS:  No chest pain or palpitations Gastrointestinal ROS: Near-complete resolution of irritable bowel symptoms   Genito-Urinary ROS: Not questioned. Previous hysterectomy.  Musculoskeletal ROS: Not questioned Neurological ROS: No headache Dermatological ROS: No rash Remaining ROS negative:   Physical Exam: Blood pressure 102/70,  pulse 65, temperature 98.1 F (36.7 C), temperature source Oral, height 5' 7.5" (1.715 m), weight 155 lb 12.8 oz (70.67 kg), SpO2 100.00%. Wt Readings from Last 3 Encounters:  06/07/14 155 lb 12.8 oz (70.67 kg)  03/15/14 154 lb (69.854 kg)  01/12/14 153 lb 8 oz (69.627 kg)     General appearance: Well-nourished Caucasian woman HENNT: Pharynx no erythema, exudate, mass, or ulcer. No thyromegaly or thyroid nodules Lymph nodes: No cervical, supraclavicular, or axillary lymphadenopathy Breasts: Lungs: Clear to auscultation, resonant to percussion throughout Heart: Regular rhythm, no murmur, no gallop, no rub, no click, no edema Abdomen: Soft, nontender, normal bowel sounds, no mass, no organomegaly Extremities: No edema, no calf tenderness Musculoskeletal: no joint deformities GU:  Vascular: Carotid pulses 2+, no bruits,  Neurologic: Alert, oriented, PERRLA, cranial nerves grossly normal, motor strength 5 over 5, reflexes 1+ symmetric, upper body coordination normal, gait normal, Skin: No rash or ecchymosis  Lab Results: CBC W/Diff    Component Value Date/Time   WBC 6.1 05/31/2014 1000   WBC 8.3 06/05/2013 1327   RBC 4.43 05/31/2014 1000   RBC 4.31 06/05/2013 1327   HGB 13.4 05/31/2014 1000   HGB 12.8 06/05/2013 1327   HCT 40.1 05/31/2014 1000   HCT 38.6 06/05/2013 1327   PLT 299 05/31/2014 1000   PLT 317 06/05/2013 1327   MCV 90.5 05/31/2014 1000   MCV 89.6 06/05/2013 1327   MCH 30.2 05/31/2014 1000   MCH 29.8 06/05/2013 1327   MCHC 33.4 05/31/2014 1000   MCHC 33.2 06/05/2013 1327   RDW 14.4 05/31/2014 1000   RDW 14.4 06/05/2013 1327   LYMPHSABS 1.7 05/31/2014  1000   LYMPHSABS 2.3 06/05/2013 1327   MONOABS 0.4 05/31/2014 1000   MONOABS 0.6 06/05/2013 1327   EOSABS 0.1 05/31/2014 1000   EOSABS 0.2 06/05/2013 1327   BASOSABS 0.0 05/31/2014 1000   BASOSABS 0.1 06/05/2013 1327     Chemistry      Component Value Date/Time   NA 137 05/31/2014 1000   NA 139 06/05/2013 1327   K 3.8 05/31/2014 1000    K 4.0 06/05/2013 1327   CL 104 05/31/2014 1000   CO2 27 05/31/2014 1000   CO2 26 06/05/2013 1327   BUN 3* 05/31/2014 1000   BUN 4.2* 06/05/2013 1327   CREATININE 0.57 05/31/2014 1000   CREATININE 0.8 06/05/2013 1327   CREATININE 0.65 06/06/2012 1106      Component Value Date/Time   CALCIUM 8.8 05/31/2014 1000   CALCIUM 9.2 06/05/2013 1327   ALKPHOS 44 05/31/2014 1000   ALKPHOS 63 06/05/2013 1327   AST 9 05/31/2014 1000   AST 7 06/05/2013 1327   ALT <8 05/31/2014 1000   ALT <6 Repeated and Verified 06/05/2013 1327   BILITOT 0.4 05/31/2014 1000   BILITOT 0.29 06/05/2013 1327      Impression:  #1. Polycythemia vera  Stable on intermittent phlebotomy and low-dose aspirin  Plan:  continue the same.  #2. Fibromyalgia syndrome  #3. Reactive depression secondary to #2. Controlled on current medications.     CC: Patient Care Team: Elsie Lincoln, MD as PCP - General (Family Medicine)   Annia Belt, MD 7/27/201511:53 AM

## 2014-06-07 NOTE — Patient Instructions (Signed)
Continue phlebotomy every 4 months next 06/17/14, December 5, 02/11/15, 06/17/15 CBC with all phlebotomies at [facility] stay unit Full lab panel in 1 year at [place] 7/18; visit with Dr Darnell Level 1 week after lab draw 06/06/15

## 2014-06-11 ENCOUNTER — Telehealth (HOSPITAL_COMMUNITY): Payer: Self-pay | Admitting: *Deleted

## 2014-06-14 ENCOUNTER — Other Ambulatory Visit (HOSPITAL_COMMUNITY): Payer: Self-pay | Admitting: Psychiatry

## 2014-06-14 DIAGNOSIS — F5105 Insomnia due to other mental disorder: Principal | ICD-10-CM

## 2014-06-14 DIAGNOSIS — F418 Other specified anxiety disorders: Secondary | ICD-10-CM

## 2014-06-14 MED ORDER — TRAZODONE HCL 50 MG PO TABS: 50.0000 mg | ORAL_TABLET | Freq: Every day | ORAL | Status: DC

## 2014-06-15 ENCOUNTER — Other Ambulatory Visit: Payer: Self-pay

## 2014-06-15 ENCOUNTER — Encounter (HOSPITAL_COMMUNITY): Payer: Self-pay | Admitting: Psychiatry

## 2014-06-15 ENCOUNTER — Ambulatory Visit (INDEPENDENT_AMBULATORY_CARE_PROVIDER_SITE_OTHER): Payer: BC Managed Care – PPO | Admitting: Psychiatry

## 2014-06-15 VITALS — BP 120/82 | Ht 67.0 in | Wt 155.0 lb

## 2014-06-15 DIAGNOSIS — F429 Obsessive-compulsive disorder, unspecified: Secondary | ICD-10-CM

## 2014-06-15 DIAGNOSIS — F321 Major depressive disorder, single episode, moderate: Secondary | ICD-10-CM

## 2014-06-15 MED ORDER — SERTRALINE HCL 100 MG PO TABS: 150.0000 mg | ORAL_TABLET | Freq: Every day | ORAL | Status: DC

## 2014-06-15 NOTE — Progress Notes (Signed)
Patient ID: Cheryl Cross, female   DOB: 09-Jan-1966, 48 y.o.   MRN: [id number] Patient ID: Cheryl Cross, female   DOB: 15-Aug-1966, 47 y.o.   MRN: [id number] Patient ID: Cheryl Cross, female   DOB: Oct 23, 1966, 48 y.o.   MRN: [id number] Patient ID: Cheryl Cross, female   DOB: 1966/04/02, 48 y.o.   MRN: [id number] [facility] MD Progress Note [id number] Cheryl Cross  MRN:  [id number] DOB:: 1966-04-01 Age: 48 y.o. Date: 06/15/2014  Chief Complaint: Chief Complaint  Patient presents with  . Anxiety  . Depression  . Follow-up   History of present illness This patient is a 48 year old separated white female who lives alone in [place]. She is a Pharmacist, hospital at [employer].  The patient stated that last year in May she got extremely depressed and became suicidal. She had back pain and fibromyalgia. She got hooked on taking too many Xanax and was sleeping all the time and unable to function. [facility] and got off the Xanax and since then has been on trazodone and Zoloft. 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 She sees Dr. Jefm Miles here which she finds very helpful. She denies being depressed and stays on a good regimen of eating and sleeping. Her mood is generally been good and she's not significantly anxious. She's not abusing any substances and she denies suicidal ideation  The patient returns after 3 months. She continues to do very well. Her mood is been stable. She tells me she is moving to [employer] this year and she is excited about it. She has not relapsed into overusing Xanax or alcohol or any other illicit substances. She states that her mood is been the best it's been several years and she is regaining her confidence. She's sleeping well and is no longer groggy  Diagnosis:   Axis I: Major Depression, Recurrent severe, Substance Abuse and  Substance Induced Mood Disorder Axis II: Deferred Axis III:  Past Medical History  Diagnosis Date  . Polycythemia vera(238.4) 12/07/2011  . Fibromyalgia   . Polycythemia vera(238.4)   . Bursitis     hips bilat  . Arthritis     back   . DDD (degenerative disc disease)   . Overactive bladder   . Depression   . PTSD (post-traumatic stress disorder)   . Anxiety   . Polycythemia vera(238.4)   . Obsessive-compulsive disorder    Axis IV: other psychosocial or environmental problems Axis V: 51-60 moderate symptoms  ADL's:  Intact  Sleep: Good, thanks to Trazodone  Appetite:  Fair  Suicidal Ideation:  Pt denies any thoughts, plans, intent of suicide\ Homicidal Ideation:  Pt denies any thoughts, plans, intent of homicide  AEB (as evidenced by):per pt report  Psychiatric Specialty Exam: ROS Patient ID: Cheryl Cross, female   DOB: 10-24-1966, 48 y.o.   MRN: [id number] Patient ID: Cheryl Cross, female   DOB: 15-Feb-1966, 48 y.o.   MRN: [id number] Patient ID: Cheryl Cross, female   DOB: 01-02-1966, 48 y.o.   MRN: [id number] [facility] MD Progress Note [id number] Cheryl Cross  MRN:  [id number] DOB:: 06-Nov-1966 Age: 48 y.o. Date: 06/15/2014  Chief Complaint: Chief Complaint  Patient presents with  . Anxiety  . Depression  . Follow-up   History of present illness This patient is a 48 year old separated white female who lives alone in [place]. 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  The patient stated that last year in May she got extremely depressed and became suicidal. She had back pain and fibromyalgia. She got hooked on taking too many Xanax and was sleeping all the time and unable to function. She was [facility] and got off the Xanax and since then has been on trazodone and Zoloft. 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 She sees Dr. Jefm Miles here which she finds  very helpful. She denies being depressed and stays on a good regimen of eating and sleeping. Her mood is generally been good and she's not significantly anxious. She's not abusing any substances and she denies suicidal ideation  The patient returns after 3 months. She continues to do very well. Her mood is been stable. She would like to cut down her trazodone because she is somewhat groggy in the mornings. She does not have any significant depression or suicidal ideation.  Diagnosis:   Axis I: Major Depression, Recurrent severe, Substance Abuse and Substance Induced Mood Disorder Axis II: Deferred Axis III:  Past Medical History  Diagnosis Date  . Polycythemia vera(238.4) 12/07/2011  . Fibromyalgia   . Polycythemia vera(238.4)   . Bursitis     hips bilat  . Arthritis     back   . DDD (degenerative disc disease)   . Overactive bladder   . Depression   . PTSD (post-traumatic stress disorder)   . Anxiety   . Polycythemia vera(238.4)   . Obsessive-compulsive disorder    Axis IV: other psychosocial or environmental problems Axis V: 51-60 moderate symptoms  ADL's:  Intact  Sleep: Good, thanks to Trazodone  Appetite:  Fair  Suicidal Ideation:  Pt denies any thoughts, plans, intent of suicide\ Homicidal Ideation:  Pt denies any thoughts, plans, intent of homicide  AEB (as evidenced by):per pt report  Psychiatric Specialty Exam: ROS  Blood pressure 120/82, height '5\' 7"'  (1.702 m), weight 155 lb (70.308 kg).Body mass index is 24.27 kg/(m^2).  General Appearance: Casual  Eye Contact::  Good  Speech:  Clear and Coherent  Volume:  Normal  Mood: good  Affect:  Congruent  Thought Process:  Coherent, Linear and Logical  Orientation:  Full (Time, Place, and Person)  Thought Content:  WDL  Suicidal Thoughts:  No  Homicidal Thoughts:  No  Memory:  Immediate;   Good Recent;   Good Remote;   Good  Judgement:  Good  Insight:  Good  Psychomotor Activity:  TYpical  Concentration:   Good  Recall:  Good  Akathisia:  No  Handed:  Right  AIMS (if indicated):     Assets:  Communication Skills Desire for Improvement  Sleep:      Current Medications: Current Outpatient Prescriptions  Medication Sig Dispense Refill  . aspirin 81 MG tablet Take 1 tablet (81 mg total) by mouth daily. For Polycythemia Vera.      . dicyclomine (BENTYL) 10 MG capsule Take 1 capsule (10 mg total) by mouth 3 (three) times daily before meals.  90 capsule  2  . LIDODERM 5 % Place 1 patch onto the skin once a week.       . pantoprazole (PROTONIX) 40 MG tablet Take 1 tablet (40 mg total) by mouth daily.  90 tablet  3  . sertraline (ZOLOFT) 100 MG tablet Take 1.5 tablets (150 mg total) by mouth daily.  45 tablet  2  . traZODone (DESYREL) 50 MG tablet Take 1 tablet (50  mg total) by mouth at bedtime. For sleep.  30 tablet  2   No current facility-administered medications for this visit.   Lab Results:  Results for orders placed in visit on 05/31/14 (from the past 8736 hour(s))  COMPLETE METABOLIC PANEL WITH GFR   Collection Time    05/31/14 10:00 AM      Result Value Ref Range   Sodium 137  135 - 145 mEq/L   Potassium 3.8  3.5 - 5.3 mEq/L   Chloride 104  96 - 112 mEq/L   CO2 27  19 - 32 mEq/L   Glucose, Bld 83  70 - 99 mg/dL   BUN 3 (*) 6 - 23 mg/dL   Creat 0.57  0.50 - 1.10 mg/dL   Total Bilirubin 0.4  0.2 - 1.2 mg/dL   Alkaline Phosphatase 44  39 - 117 U/L   AST 9  0 - 37 U/L   ALT <8  0 - 35 U/L   Total Protein 6.3  6.0 - 8.3 g/dL   Albumin 4.1  3.5 - 5.2 g/dL   Calcium 8.8  8.4 - 10.5 mg/dL   GFR, Est African American >89     GFR, Est Non African American >89    FERRITIN   Collection Time    05/31/14 10:00 AM      Result Value Ref Range   Ferritin 10  10 - 291 ng/mL  CBC WITH DIFFERENTIAL   Collection Time    05/31/14 10:00 AM      Result Value Ref Range   WBC 6.1  4.0 - 10.5 K/uL   RBC 4.43  3.87 - 5.11 MIL/uL   Hemoglobin 13.4  12.0 - 15.0 g/dL   HCT 40.1  36.0 - 46.0 %    MCV 90.5  78.0 - 100.0 fL   MCH 30.2  26.0 - 34.0 pg   MCHC 33.4  30.0 - 36.0 g/dL   RDW 14.4  11.5 - 15.5 %   Platelets 299  150 - 400 K/uL   Neutrophils Relative % 63  43 - 77 %   Neutro Abs 3.8  1.7 - 7.7 K/uL   Lymphocytes Relative 28  12 - 46 %   Lymphs Abs 1.7  0.7 - 4.0 K/uL   Monocytes Relative 7  3 - 12 %   Monocytes Absolute 0.4  0.1 - 1.0 K/uL   Eosinophils Relative 2  0 - 5 %   Eosinophils Absolute 0.1  0.0 - 0.7 K/uL   Basophils Relative 0  0 - 1 %   Basophils Absolute 0.0  0.0 - 0.1 K/uL   Smear Review Criteria for review not met    Results for orders placed during the hospital encounter of 02/11/14 (from the past 8736 hour(s))  CBC   Collection Time    02/11/14  1:20 PM      Result Value Ref Range   WBC 6.5  4.0 - 10.5 K/uL   RBC 4.28  3.87 - 5.11 MIL/uL   Hemoglobin 13.0  12.0 - 15.0 g/dL   HCT 38.3  36.0 - 46.0 %   MCV 89.5  78.0 - 100.0 fL   MCH 30.4  26.0 - 34.0 pg   MCHC 33.9  30.0 - 36.0 g/dL   RDW 13.5  11.5 - 15.5 %   Platelets 281  150 - 400 K/uL  Results for orders placed during the hospital encounter of 09/22/13 (from the past 8736 hour(s))  CBC  Collection Time    09/22/13 10:17 AM      Result Value Ref Range   WBC 6.3  4.0 - 10.5 K/uL   RBC 4.50  3.87 - 5.11 MIL/uL   Hemoglobin 14.0  12.0 - 15.0 g/dL   HCT 40.9  36.0 - 46.0 %   MCV 90.9  78.0 - 100.0 fL   MCH 31.1  26.0 - 34.0 pg   MCHC 34.2  30.0 - 36.0 g/dL   RDW 14.1  11.5 - 15.5 %   Platelets 324  150 - 400 K/uL   Physical Findings: AIMS:  , ,  ,  ,    CIWA:    COWS:     Treatment Plan Summary: Medication management  Plan: I will continue Zoloft and trazodone can be cut in half to 25 mg if needed Patient at this time doesn't have any side effects.  Recommend to call us back if she has any question concerns or if she feels worsening symptoms.  Followup in 3 months.  MEDICATIONS this encounter: Meds ordered this encounter  Medications  . sertraline (ZOLOFT) 100 MG tablet     Sig: Take 1.5 tablets (150 mg total) by mouth daily.    Dispense:  45 tablet    Refill:  2    Medical Decision Making Problem Points:  Established problem, stable/improving (1) and Review of last therapy session (1) Data Points:  Review or order clinical lab tests (1) Review of medication regiment & side effects (2)  Monti Villers 06/15/2014, 1:08 PM   Blood pressure 120/82, height '5\' 7"'  (1.702 m), weight 155 lb (70.308 kg).Body mass index is 24.27 kg/(m^2).  General Appearance: Casual  Eye Contact::  Good  Speech:  Clear and Coherent  Volume:  Normal  Mood: good  Affect:  Congruent  Thought Process:  Coherent, Linear and Logical  Orientation:  Full (Time, Place, and Person)  Thought Content:  WDL  Suicidal Thoughts:  No  Homicidal Thoughts:  No  Memory:  Immediate;   Good Recent;   Good Remote;   Good  Judgement:  Good  Insight:  Good  Psychomotor Activity:  TYpical  Concentration:  Good  Recall:  Good  Akathisia:  No  Handed:  Right  AIMS (if indicated):     Assets:  Communication Skills Desire for Improvement  Sleep:      Current Medications: Current Outpatient Prescriptions  Medication Sig Dispense Refill  . aspirin 81 MG tablet Take 1 tablet (81 mg total) by mouth daily. For Polycythemia Vera.      . dicyclomine (BENTYL) 10 MG capsule Take 1 capsule (10 mg total) by mouth 3 (three) times daily before meals.  90 capsule  2  . LIDODERM 5 % Place 1 patch onto the skin once a week.       . pantoprazole (PROTONIX) 40 MG tablet Take 1 tablet (40 mg total) by mouth daily.  90 tablet  3  . sertraline (ZOLOFT) 100 MG tablet Take 1.5 tablets (150 mg total) by mouth daily.  45 tablet  2  . traZODone (DESYREL) 50 MG tablet Take 1 tablet (50 mg total) by mouth at bedtime. For sleep.  30 tablet  2   No current facility-administered medications for this visit.   Lab Results:  Results for orders placed in visit on 05/31/14 (from the past 8736 hour(s))  COMPLETE METABOLIC  PANEL WITH GFR   Collection Time    05/31/14 10:00 AM  Result Value Ref Range   Sodium 137  135 - 145 mEq/L   Potassium 3.8  3.5 - 5.3 mEq/L   Chloride 104  96 - 112 mEq/L   CO2 27  19 - 32 mEq/L   Glucose, Bld 83  70 - 99 mg/dL   BUN 3 (*) 6 - 23 mg/dL   Creat 0.57  0.50 - 1.10 mg/dL   Total Bilirubin 0.4  0.2 - 1.2 mg/dL   Alkaline Phosphatase 44  39 - 117 U/L   AST 9  0 - 37 U/L   ALT <8  0 - 35 U/L   Total Protein 6.3  6.0 - 8.3 g/dL   Albumin 4.1  3.5 - 5.2 g/dL   Calcium 8.8  8.4 - 10.5 mg/dL   GFR, Est African American >89     GFR, Est Non African American >89    FERRITIN   Collection Time    05/31/14 10:00 AM      Result Value Ref Range   Ferritin 10  10 - 291 ng/mL  CBC WITH DIFFERENTIAL   Collection Time    05/31/14 10:00 AM      Result Value Ref Range   WBC 6.1  4.0 - 10.5 K/uL   RBC 4.43  3.87 - 5.11 MIL/uL   Hemoglobin 13.4  12.0 - 15.0 g/dL   HCT 40.1  36.0 - 46.0 %   MCV 90.5  78.0 - 100.0 fL   MCH 30.2  26.0 - 34.0 pg   MCHC 33.4  30.0 - 36.0 g/dL   RDW 14.4  11.5 - 15.5 %   Platelets 299  150 - 400 K/uL   Neutrophils Relative % 63  43 - 77 %   Neutro Abs 3.8  1.7 - 7.7 K/uL   Lymphocytes Relative 28  12 - 46 %   Lymphs Abs 1.7  0.7 - 4.0 K/uL   Monocytes Relative 7  3 - 12 %   Monocytes Absolute 0.4  0.1 - 1.0 K/uL   Eosinophils Relative 2  0 - 5 %   Eosinophils Absolute 0.1  0.0 - 0.7 K/uL   Basophils Relative 0  0 - 1 %   Basophils Absolute 0.0  0.0 - 0.1 K/uL   Smear Review Criteria for review not met    Results for orders placed during the hospital encounter of 02/11/14 (from the past 8736 hour(s))  CBC   Collection Time    02/11/14  1:20 PM      Result Value Ref Range   WBC 6.5  4.0 - 10.5 K/uL   RBC 4.28  3.87 - 5.11 MIL/uL   Hemoglobin 13.0  12.0 - 15.0 g/dL   HCT 38.3  36.0 - 46.0 %   MCV 89.5  78.0 - 100.0 fL   MCH 30.4  26.0 - 34.0 pg   MCHC 33.9  30.0 - 36.0 g/dL   RDW 13.5  11.5 - 15.5 %   Platelets 281  150 - 400 K/uL   Results for orders placed during the hospital encounter of 09/22/13 (from the past 8736 hour(s))  CBC   Collection Time    09/22/13 10:17 AM      Result Value Ref Range   WBC 6.3  4.0 - 10.5 K/uL   RBC 4.50  3.87 - 5.11 MIL/uL   Hemoglobin 14.0  12.0 - 15.0 g/dL   HCT 40.9  36.0 - 46.0 %   MCV 90.9  78.0 - 100.0  fL   MCH 31.1  26.0 - 34.0 pg   MCHC 34.2  30.0 - 36.0 g/dL   RDW 14.1  11.5 - 15.5 %   Platelets 324  150 - 400 K/uL   Physical Findings: AIMS:  , ,  ,  ,    CIWA:    COWS:     Treatment Plan Summary: Medication management  Plan: I will continue Zoloft and trazodone can be cut in half to 25 mg if needed Patient at this time doesn't have any side effects.  Recommend to call us back if she has any question concerns or if she feels worsening symptoms.  Followup in 3 months.  MEDICATIONS this encounter: Meds ordered this encounter  Medications  . sertraline (ZOLOFT) 100 MG tablet    Sig: Take 1.5 tablets (150 mg total) by mouth daily.    Dispense:  45 tablet    Refill:  2    Medical Decision Making Problem Points:  Established problem, stable/improving (1) and Review of last therapy session (1) Data Points:  Review or order clinical lab tests (1) Review of medication regiment & side effects (2)  Waldine Zenz, [place] 06/15/2014, 1:08 PM

## 2014-06-17 ENCOUNTER — Encounter (HOSPITAL_COMMUNITY)
Admission: RE | Admit: 2014-06-17 | Discharge: 2014-06-17 | Disposition: A | Discharge status: Home / Self Care | Payer: BC Managed Care – PPO | Source: Ambulatory Visit | Attending: Oncology | Admitting: Oncology

## 2014-06-17 ENCOUNTER — Encounter (HOSPITAL_COMMUNITY): Payer: Self-pay

## 2014-06-17 DIAGNOSIS — D45 Polycythemia vera: Secondary | ICD-10-CM | POA: Insufficient documentation

## 2014-06-17 LAB — CBC WITH DIFFERENTIAL/PLATELET
BASOS ABS: 0 10*3/uL (ref 0.0–0.1)
BASOS PCT: 0 % (ref 0–1)
EOS ABS: 0.2 10*3/uL (ref 0.0–0.7)
EOS PCT: 3 % (ref 0–5)
HEMATOCRIT: 41.2 % (ref 36.0–46.0)
Hemoglobin: 13.8 g/dL (ref 12.0–15.0)
Lymphocytes Relative: 33 % (ref 12–46)
Lymphs Abs: 2.4 10*3/uL (ref 0.7–4.0)
MCH: 30.3 pg (ref 26.0–34.0)
MCHC: 33.5 g/dL (ref 30.0–36.0)
MCV: 90.5 fL (ref 78.0–100.0)
MONO ABS: 0.4 10*3/uL (ref 0.1–1.0)
Monocytes Relative: 5 % (ref 3–12)
Neutro Abs: 4.3 10*3/uL (ref 1.7–7.7)
Neutrophils Relative %: 59 % (ref 43–77)
PLATELETS: 311 10*3/uL (ref 150–400)
RBC: 4.55 MIL/uL (ref 3.87–5.11)
RDW: 13.5 % (ref 11.5–15.5)
WBC: 7.3 10*3/uL (ref 4.0–10.5)

## 2014-06-17 NOTE — Procedures (Signed)
Started #20 to rt wrist, cbc was drawn and hbg was 13.8 today.  572ml of blood was drawn, started at 1325 and finished at 1355.  Pt tolerated well.

## 2014-06-22 ENCOUNTER — Ambulatory Visit: Payer: Self-pay | Admitting: Oncology

## 2014-08-31 ENCOUNTER — Other Ambulatory Visit (HOSPITAL_COMMUNITY): Payer: Self-pay | Admitting: Family Medicine

## 2014-08-31 ENCOUNTER — Ambulatory Visit (HOSPITAL_COMMUNITY)
Admission: RE | Admit: 2014-08-31 | Discharge: 2014-08-31 | Disposition: A | Discharge status: Home / Self Care | Payer: BC Managed Care – PPO | Source: Ambulatory Visit | Attending: Family Medicine | Admitting: Family Medicine

## 2014-08-31 DIAGNOSIS — R109 Unspecified abdominal pain: Secondary | ICD-10-CM

## 2014-08-31 DIAGNOSIS — R1013 Epigastric pain: Secondary | ICD-10-CM | POA: Insufficient documentation

## 2014-08-31 DIAGNOSIS — R110 Nausea: Secondary | ICD-10-CM | POA: Diagnosis not present

## 2014-08-31 DIAGNOSIS — R1012 Left upper quadrant pain: Secondary | ICD-10-CM

## 2014-08-31 DIAGNOSIS — R2231 Localized swelling, mass and lump, right upper limb: Secondary | ICD-10-CM

## 2014-08-31 IMAGING — CR DG ABDOMEN ACUTE W/ 1V CHEST
3 series · 3 of 3 positions shown · non-contrast
Comparison: None.

CLINICAL DATA: Epigastric pain, nausea

EXAM:
ACUTE ABDOMEN SERIES (ABDOMEN 2 VIEW & CHEST 1 VIEW)

[view not recorded (1 of 3)]
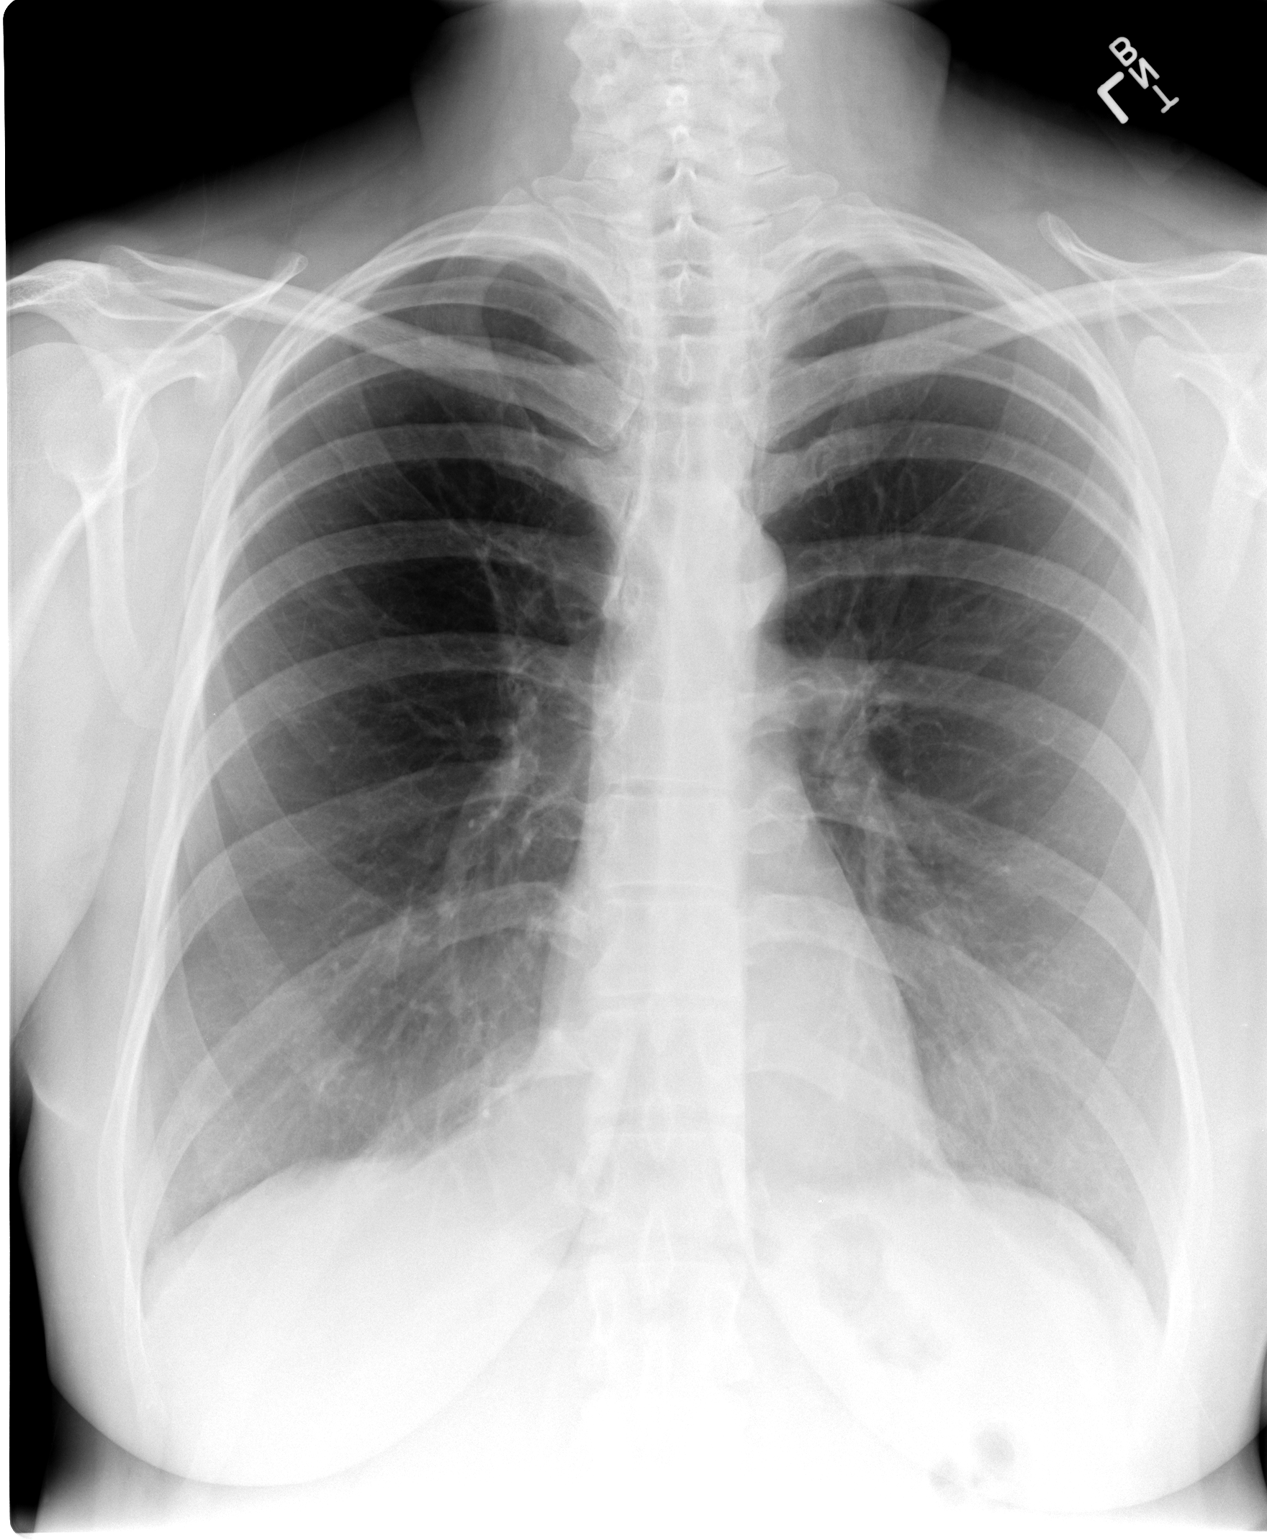

[view not recorded (2 of 3)]
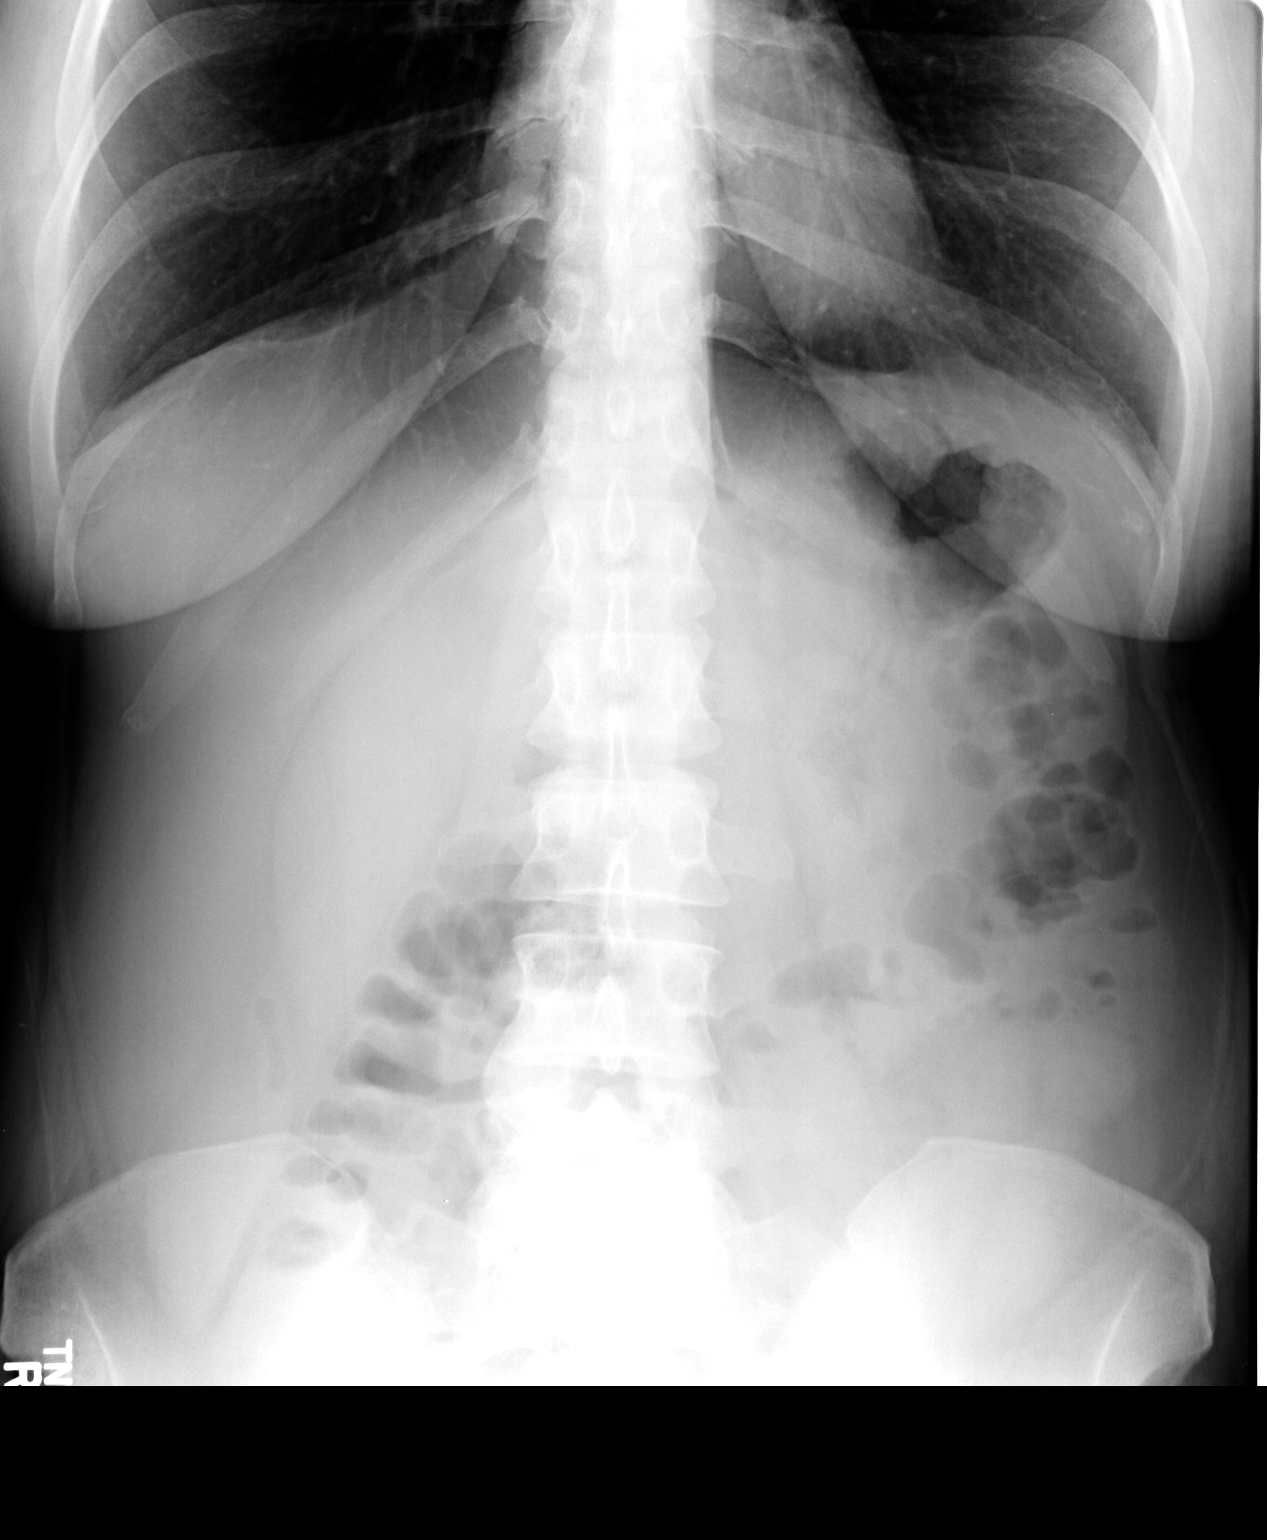

[view not recorded (3 of 3)]
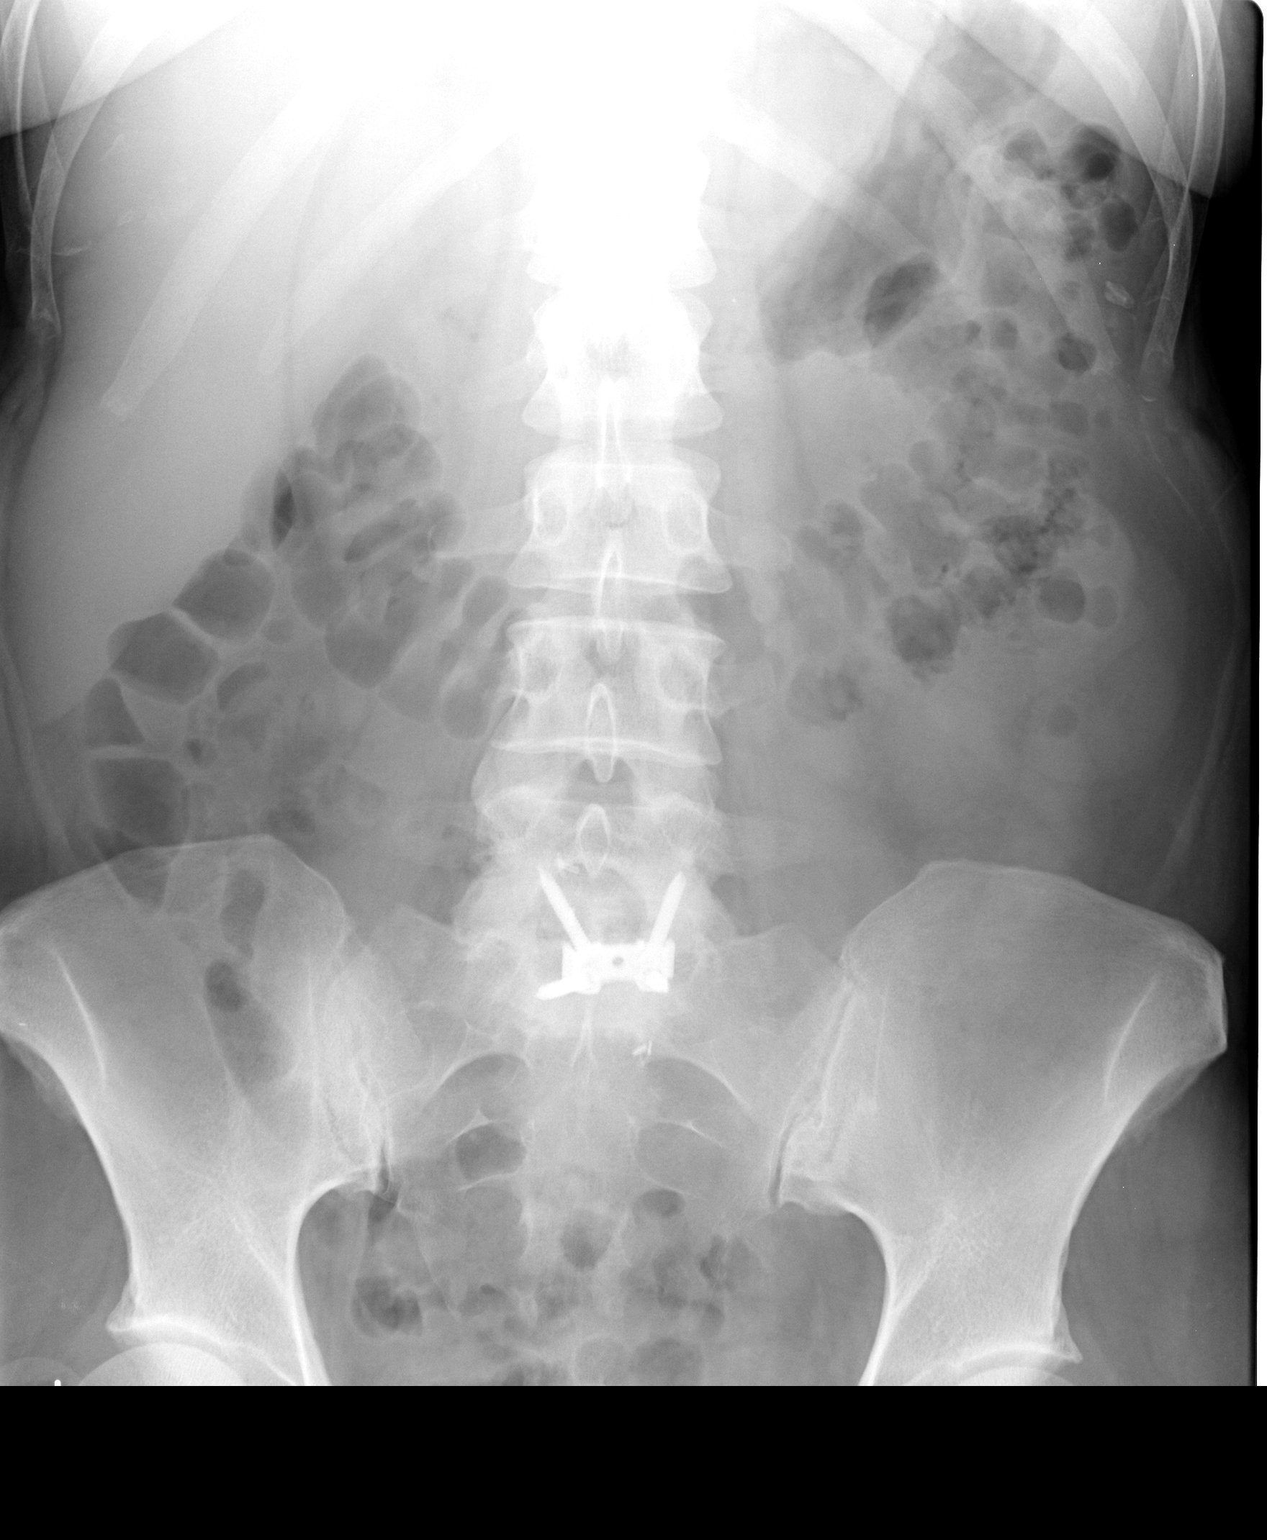

[3 of 3 positions shown; findings below may reference images not displayed]

FINDINGS: There is no evidence of dilated bowel loops or free intraperitoneal
air. No radiopaque calculi or other significant radiographic
abnormality is seen. Heart size and mediastinal contours are within
normal limits. Both lungs are clear.

Anterior spinal fusion at L5-S1.
IMPRESSION: Negative abdominal radiographs.  No acute cardiopulmonary disease.

## 2014-09-01 ENCOUNTER — Other Ambulatory Visit (HOSPITAL_COMMUNITY): Payer: Self-pay | Admitting: Family Medicine

## 2014-09-01 ENCOUNTER — Ambulatory Visit (HOSPITAL_COMMUNITY)
Admission: RE | Admit: 2014-09-01 | Discharge: 2014-09-01 | Disposition: A | Discharge status: Home / Self Care | Payer: BC Managed Care – PPO | Source: Ambulatory Visit | Attending: Family Medicine | Admitting: Family Medicine

## 2014-09-01 DIAGNOSIS — R1012 Left upper quadrant pain: Secondary | ICD-10-CM

## 2014-09-01 IMAGING — US US ABDOMEN LIMITED
1 series · 11 of 11 positions shown · non-contrast
Comparison: [DATE].

CLINICAL DATA: Left upper quadrant pain, evaluate spleen

EXAM:
US ABDOMEN LIMITED - left UPPER QUADRANT

[Series 1: us abdomen limited · 0.18mm/px · 11 of 11 slices shown]
[im 1/11]
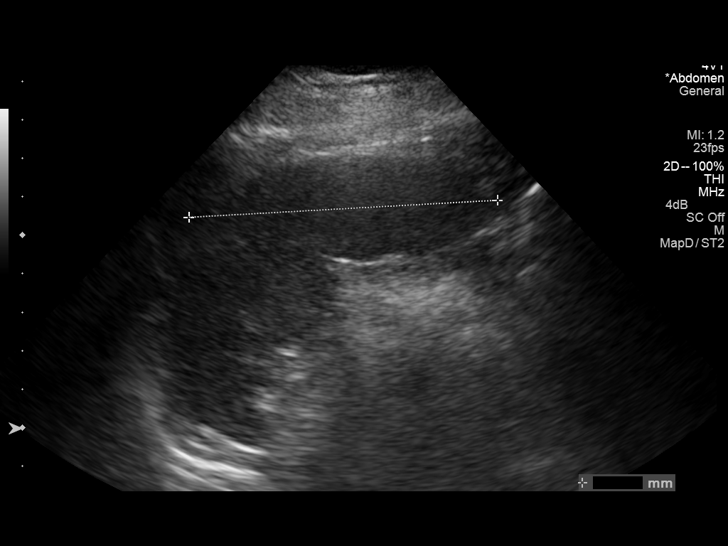
[im 2/11]
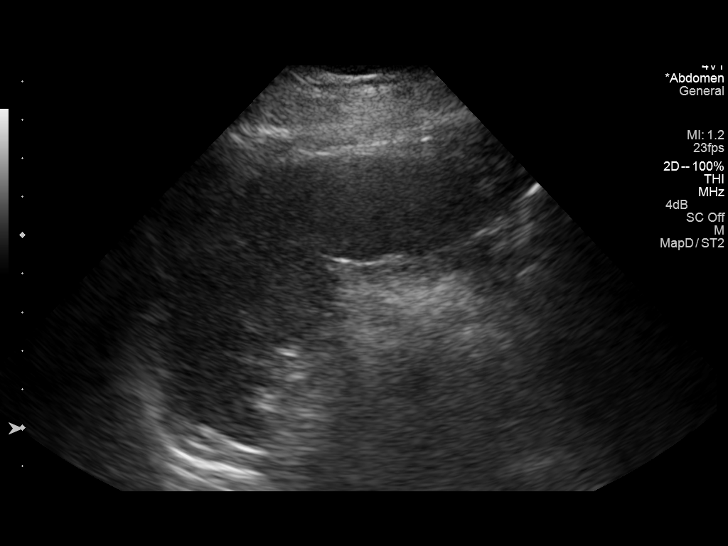
[im 3/11]
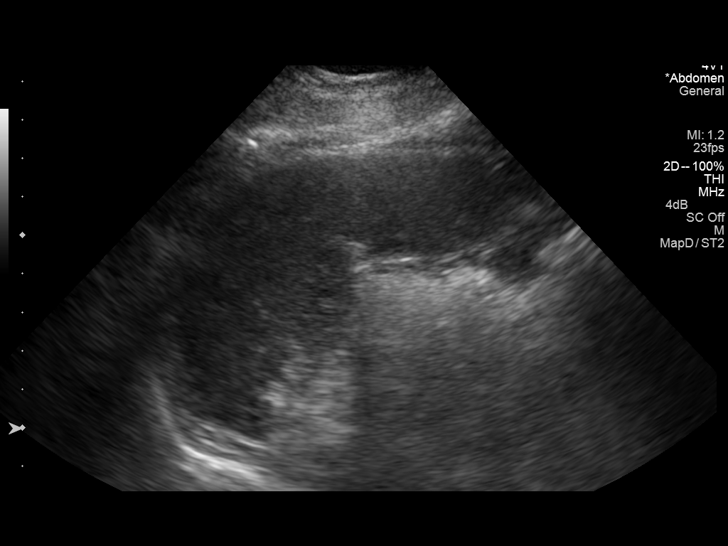
[im 4/11]
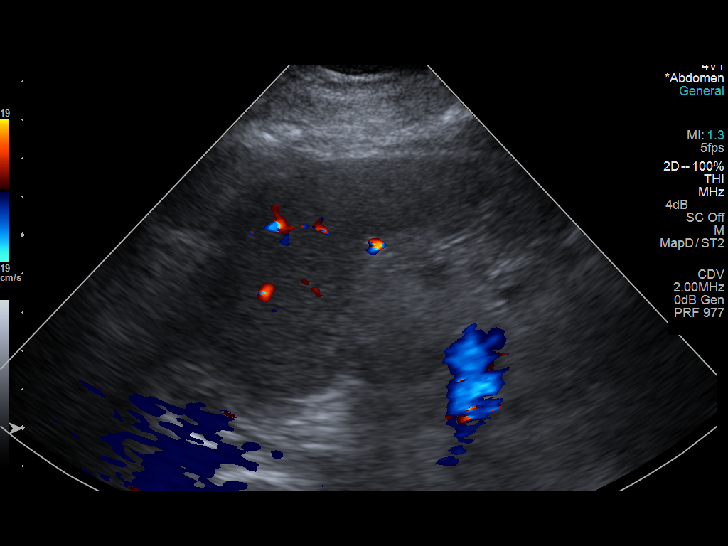
[im 5/11]
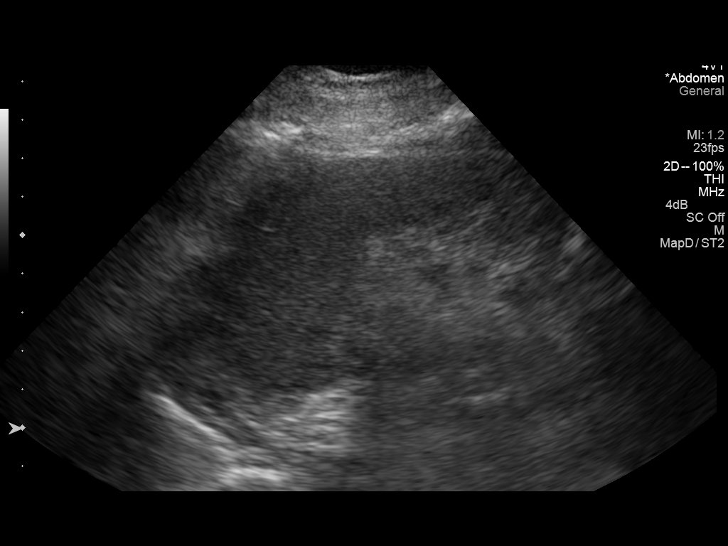
[im 6/11]
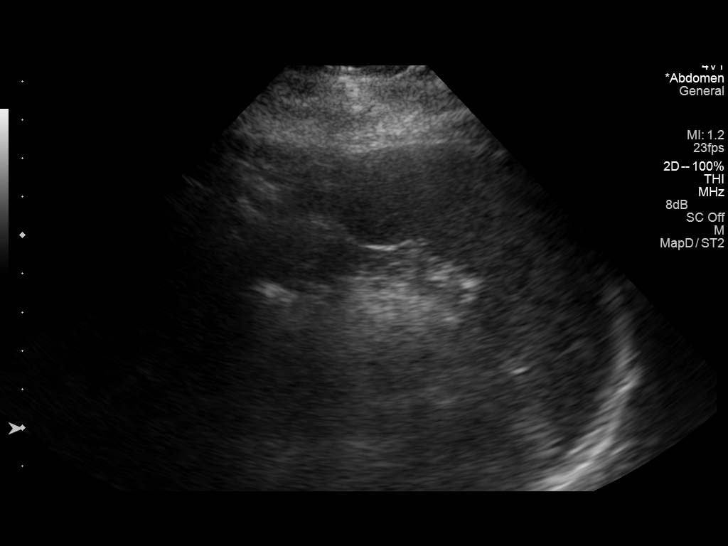
[im 7/11]
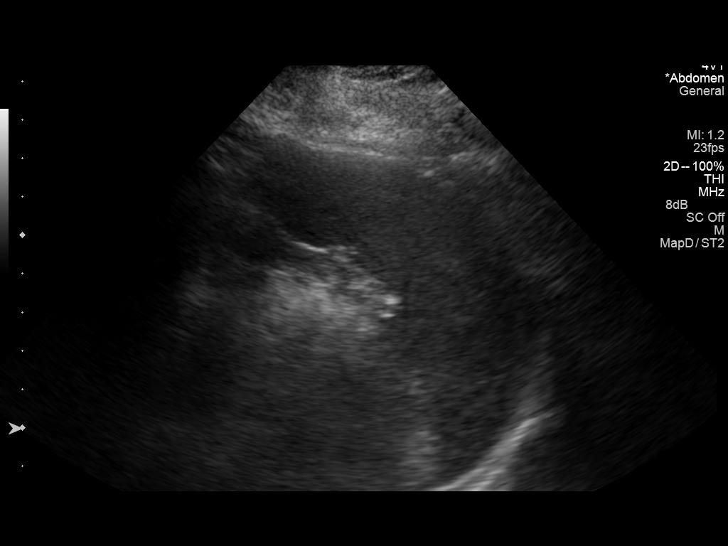
[im 8/11]
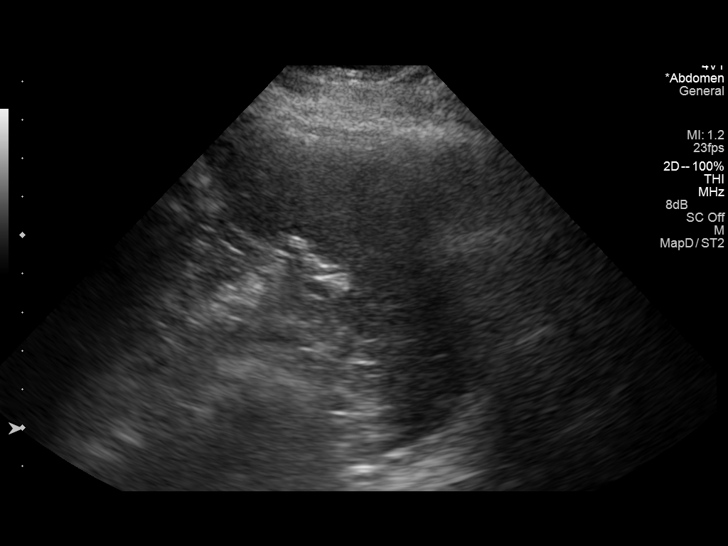
[im 9/11]
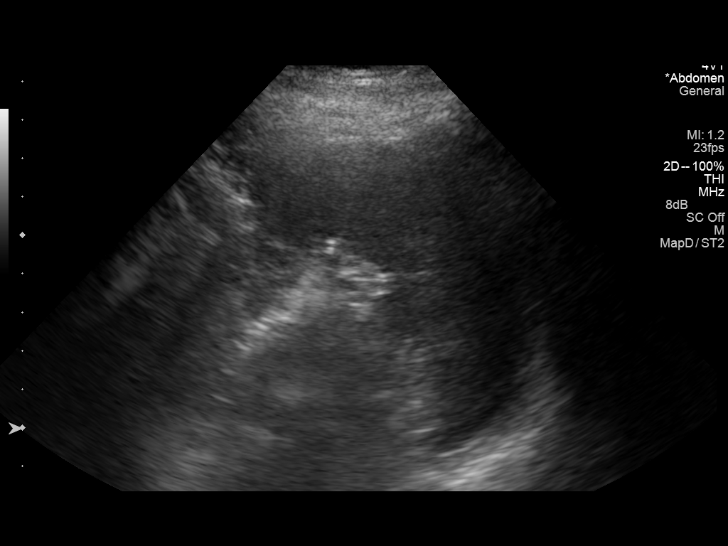
[im 10/11]
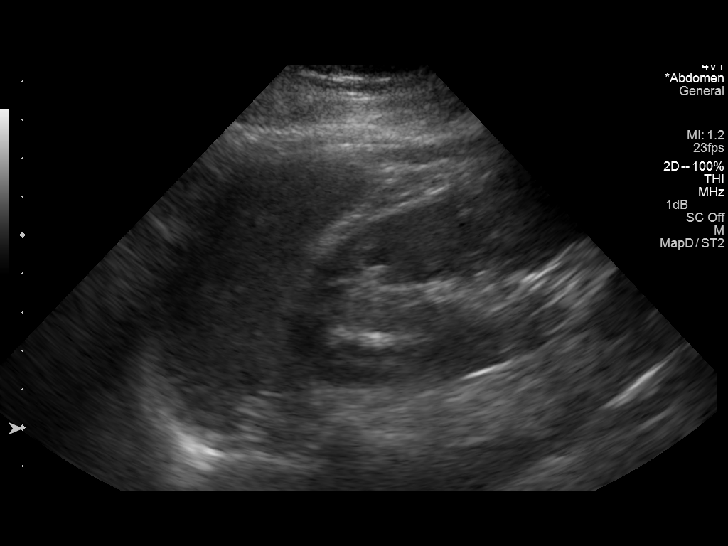
[im 11/11]
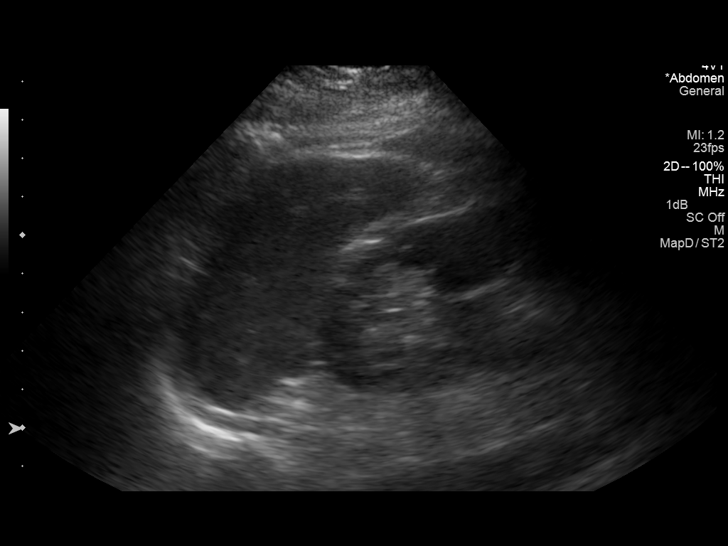

[11 of 11 positions shown; findings below may reference images not displayed]

FINDINGS: The spleen measures 8 by 7 cm.  No focal splenic mass.
IMPRESSION: Normal size spleen without focal abnormality.

## 2014-09-02 ENCOUNTER — Encounter (INDEPENDENT_AMBULATORY_CARE_PROVIDER_SITE_OTHER): Payer: Self-pay | Admitting: Internal Medicine

## 2014-09-03 ENCOUNTER — Other Ambulatory Visit (HOSPITAL_COMMUNITY): Payer: Self-pay

## 2014-09-06 ENCOUNTER — Encounter (INDEPENDENT_AMBULATORY_CARE_PROVIDER_SITE_OTHER): Payer: Self-pay | Admitting: *Deleted

## 2014-09-06 ENCOUNTER — Ambulatory Visit (INDEPENDENT_AMBULATORY_CARE_PROVIDER_SITE_OTHER): Payer: BC Managed Care – PPO | Admitting: Internal Medicine

## 2014-09-06 ENCOUNTER — Encounter (INDEPENDENT_AMBULATORY_CARE_PROVIDER_SITE_OTHER): Payer: Self-pay | Admitting: Internal Medicine

## 2014-09-06 ENCOUNTER — Other Ambulatory Visit (INDEPENDENT_AMBULATORY_CARE_PROVIDER_SITE_OTHER): Payer: Self-pay | Admitting: *Deleted

## 2014-09-06 VITALS — BP 132/84 | HR 72 | Temp 97.4°F | Ht 67.0 in | Wt 153.6 lb

## 2014-09-06 DIAGNOSIS — R1013 Epigastric pain: Principal | ICD-10-CM

## 2014-09-06 DIAGNOSIS — G8929 Other chronic pain: Secondary | ICD-10-CM

## 2014-09-06 NOTE — Patient Instructions (Signed)
EGD. The risks and benefits such as perforation, bleeding, and infection were reviewed with the patient and is agreeable. 

## 2014-09-06 NOTE — Progress Notes (Signed)
Subjective:    Patient ID: Cheryl Cross, female    DOB: [birth year], 48 y.o.   MRN: [id number]  HPI Presents today with epigastric pain. She describes the pain as a catch. She says when it catches or clinches, she cannot catch her breath. Rates the pain at a 10 when it occurs. Symptoms x 2 weeks. When the pain occurs, sometimes she is bending over and today she lifted her leg to put her shoes on and had the pain.  She says she has nausea and it comes and goes. Occurs sometimes when she coughs or steps hard. When she tries to straighten her chest up, she has tightness in her epigastric region.  It comes in waves.  She has a BM about 1--2 a day. Hx of IBS. She has a lot of gas. She says she could not work last week.   Appetite has not been good though she has not lost any weight.  She says she is uncomfortable now. She says her epigastric region feels tight and it she pushes on her epigastric region she get nauseated. Hx of polycythemia vera and has phebotomy about 4 times a year. 01/18/2014 HIDA scan: normal   11/30/2013 US abdomen:  CBD 3.60mm  IMPRESSION:  1. The liver, gallbladder, common bile duct, and spleen are normal  in appearance.  2. Neither kidney exhibits evidence of obstruction. There is a 7 mm  diameter hyperechoic focus which may reflect a nonobstructing stone  or parenchymal calcification in the midpole of the left kidney.  PROCEDURE DATE: 03/30/2010   PROCEDURE: EGD, diagnostic  ASA CLASS: Class I  INDICATIONS: abdominal pain despite treatment Pt has h/o  moderately severe burning epigastric pain unresponsive to PPI  therapy including Nexium, which made her diarrhea worse. Pt has  h/o IBS/diarrhea treated successfully with Lotronex  ENDOSCOPIC IMPRESSION:  1) Normal EGD  02/23/2010 Colonoscopy:  ENDOSCOPIC IMPRESSION:  1) Normal colon  RECOMMENDATIONS:  1) Await biopsy results  2) call office next 1-3 days to schedule followup visit in 2  weeks  Biopsy:   1.  COLON, BIOPSY, RANDOM : BENIGN COLONIC MUCOSA. NO MICROSCOPIC COLITIS, ACTIVE INFLAMMATION OR GRANULOMAS.     Review of Systems Past Medical History  Diagnosis Date  . Polycythemia vera(238.4) 12/07/2011  . Fibromyalgia   . Polycythemia vera(238.4)   . Bursitis     hips bilat  . Arthritis     back   . DDD (degenerative disc disease)   . Overactive bladder   . Depression   . PTSD (post-traumatic stress disorder)   . Anxiety   . Polycythemia vera(238.4)   . Obsessive-compulsive disorder     Past Surgical History  Procedure Laterality Date  . Abdominal hysterectomy    . Lumbar fusion      Allergies  Allergen Reactions  . Cyclobenzaprine     Hot sensation  . Diclofenac Sodium     REACTION: Bad itch  . Gabapentin Hives  . Penicillins     unknown  . Robaxin [Methocarbamol] Hives  . Voltaren [Diclofenac Sodium] Itching    Current Outpatient Prescriptions on File Prior to Visit  Medication Sig Dispense Refill  . aspirin 81 MG tablet Take 1 tablet (81 mg total) by mouth daily. For Polycythemia Vera.      . dicyclomine (BENTYL) 10 MG capsule Take 1 capsule (10 mg total) by mouth 3 (three) times daily before meals.  90 capsule  2  . LIDODERM 5 % Place 1 patch onto  the skin once a week.       . pantoprazole (PROTONIX) 40 MG tablet Take 1 tablet (40 mg total) by mouth daily.  90 tablet  3  . sertraline (ZOLOFT) 100 MG tablet Take 1.5 tablets (150 mg total) by mouth daily.  45 tablet  2  . traZODone (DESYREL) 50 MG tablet Take 1 tablet (50 mg total) by mouth at bedtime. For sleep.  30 tablet  2   No current facility-administered medications on file prior to visit.        Objective:   Physical Exam  Filed Vitals:   09/06/14 1523  BP: 132/84  Pulse: 72  Temp: 97.4 F (36.3 C)  Height: 5\' 7"  (1.702 m)  Weight: 153 lb 9.6 oz (69.673 kg)    Alert and oriented. Skin warm and dry. Oral mucosa is moist.   . Sclera anicteric, conjunctivae is pink. Thyroid not enlarged.  No cervical lymphadenopathy. Lungs clear. Heart regular rate and rhythm.  Abdomen is soft. Bowel sounds are positive. No hepatomegaly. No abdominal masses felt . Epigastric  tenderness.  No edema to lower extremities.         Assessment & Plan:  Epigastric pain with movement. I feel like this is more MS pain. She does c/o fullness and tightness to this area Will rule out PUD  EGD. The risks and benefits such as perforation, bleeding, and infection were reviewed with the patient and is agreeable. Protnix once a day.

## 2014-09-07 ENCOUNTER — Encounter (HOSPITAL_COMMUNITY): Payer: Self-pay | Admitting: Pharmacy Technician

## 2014-09-10 ENCOUNTER — Encounter (HOSPITAL_COMMUNITY): Payer: Self-pay | Admitting: *Deleted

## 2014-09-10 ENCOUNTER — Encounter (HOSPITAL_COMMUNITY)
Admission: RE | Disposition: A | Discharge status: Home / Self Care | Payer: Self-pay | Source: Ambulatory Visit | Attending: Internal Medicine

## 2014-09-10 ENCOUNTER — Ambulatory Visit (HOSPITAL_COMMUNITY)
Admission: RE | Admit: 2014-09-10 | Discharge: 2014-09-10 | Disposition: A | Discharge status: Home / Self Care | Payer: BC Managed Care – PPO | Source: Ambulatory Visit | Attending: Internal Medicine | Admitting: Internal Medicine

## 2014-09-10 DIAGNOSIS — R1013 Epigastric pain: Secondary | ICD-10-CM

## 2014-09-10 DIAGNOSIS — D45 Polycythemia vera: Secondary | ICD-10-CM | POA: Diagnosis not present

## 2014-09-10 DIAGNOSIS — F17210 Nicotine dependence, cigarettes, uncomplicated: Secondary | ICD-10-CM | POA: Diagnosis not present

## 2014-09-10 DIAGNOSIS — Z79899 Other long term (current) drug therapy: Secondary | ICD-10-CM | POA: Diagnosis not present

## 2014-09-10 DIAGNOSIS — Z7982 Long term (current) use of aspirin: Secondary | ICD-10-CM | POA: Diagnosis not present

## 2014-09-10 DIAGNOSIS — F4310 Post-traumatic stress disorder, unspecified: Secondary | ICD-10-CM | POA: Insufficient documentation

## 2014-09-10 DIAGNOSIS — R1906 Epigastric swelling, mass or lump: Secondary | ICD-10-CM

## 2014-09-10 DIAGNOSIS — F418 Other specified anxiety disorders: Secondary | ICD-10-CM | POA: Diagnosis not present

## 2014-09-10 DIAGNOSIS — K2960 Other gastritis without bleeding: Secondary | ICD-10-CM | POA: Insufficient documentation

## 2014-09-10 DIAGNOSIS — R110 Nausea: Secondary | ICD-10-CM | POA: Insufficient documentation

## 2014-09-10 DIAGNOSIS — G8929 Other chronic pain: Secondary | ICD-10-CM

## 2014-09-10 HISTORY — PX: ESOPHAGOGASTRODUODENOSCOPY: SHX5428

## 2014-09-10 SURGERY — EGD (ESOPHAGOGASTRODUODENOSCOPY)
Anesthesia: Moderate Sedation

## 2014-09-10 MED ORDER — MIDAZOLAM HCL 5 MG/5ML IJ SOLN
INTRAMUSCULAR | Status: DC | PRN
  Administered 2014-09-10: 3 mg via INTRAVENOUS
  Administered 2014-09-10: 2 mg via INTRAVENOUS
  Administered 2014-09-10: 1 mg via INTRAVENOUS

## 2014-09-10 MED ORDER — SODIUM CHLORIDE 0.9 % IV SOLN
INTRAVENOUS | Status: DC
  Administered 2014-09-10: 14:00:00 via INTRAVENOUS

## 2014-09-10 MED ORDER — MEPERIDINE HCL 50 MG/ML IJ SOLN
INTRAMUSCULAR | Status: AC
  Filled 2014-09-10: qty 1

## 2014-09-10 MED ORDER — MEPERIDINE HCL 50 MG/ML IJ SOLN
INTRAMUSCULAR | Status: DC | PRN
  Administered 2014-09-10 (×2): 25 mg via INTRAVENOUS

## 2014-09-10 MED ORDER — STERILE WATER FOR IRRIGATION IR SOLN
Status: DC | PRN
  Administered 2014-09-10: 15:00:00

## 2014-09-10 MED ORDER — MIDAZOLAM HCL 5 MG/5ML IJ SOLN
INTRAMUSCULAR | Status: DC
Start: 2014-09-10 — End: 2014-09-10
  Filled 2014-09-10: qty 10

## 2014-09-10 NOTE — Op Note (Signed)
EGD PROCEDURE REPORT  PATIENT:  Cheryl Cross  MR#:  [id number] Birthdate:  [birth year], 48 y.o., female Endoscopist:  Dr. Rogene Houston, MD  Procedure Date: 09/10/2014  Procedure:   EGD  Indications:  Patient is 48 year old Caucasian female who presents with over 3 month history of epigastric pain nausea and fullness. Some of her pain appears to be musculoskeletal or wall pain. She has not responded to PPI therapy. She is therefore undergoing diagnostic EGD.  She had her ultrasound back in January 2015 and was negative for cholelithiasis.           Informed Consent:  The risks, benefits, alternatives & imponderables which include, but are not limited to, bleeding, infection, perforation, drug reaction and potential missed lesion have been reviewed.  The potential for biopsy, lesion removal, esophageal dilation, etc. have also been discussed.  Questions have been answered.  All parties agreeable.  Please see history & physical in medical record for more information.  Medications:  Demerol 50 mg IV Versed 7 mg IV Cetacaine spray topically for oropharyngeal anesthesia  Description of procedure:  The endoscope was introduced through the mouth and advanced to the second portion of the duodenum without difficulty or limitations. The mucosal surfaces were surveyed very carefully during advancement of the scope and upon withdrawal.  Findings:  Esophagus:  Mucosa of the esophagus was normal. GE junction was unremarkable. GEJ:  40 cm Stomach:  Stomach was empty and distended very well with insufflation. Folds in the proximal stomach were normal. Examination of mucosa at gastric body was normal. There were few anterior erosions but no ulcer crater present. Pyloric channel was patent. Angularis fundus and cardia were normal. Duodenum:  Normal bulbar and postbulbar mucosa.  Therapeutic/Diagnostic Maneuvers Performed:  Antral biopsy taken for CLOtest.  Complications:   None  Impression: Erosive antral gastritis otherwise normal EGD. Antral biopsy taken for CLOtest.  Recommendations:  Standards instructions given. I will be contacting patient results of CLOtest and further recommendations.  REHMAN,NAJEEB U  09/10/2014  3:07 PM  CC: Dr. Ethlyn Gallery, Kassie Mends, MD & Dr. No ref. provider found

## 2014-09-10 NOTE — Discharge Instructions (Signed)
Resume usual medications and diet. No driving for 24 hours. Physician will call with CLOtest results(to check for H. pylori infection).                                                                                                           Esophagogastroduodenoscopy Care After Refer to this sheet in the next few weeks. These instructions provide you with information on caring for yourself after your procedure. Your caregiver may also give you more specific instructions. Your treatment has been planned according to current medical practices, but problems sometimes occur. Call your caregiver if you have any problems or questions after your procedure.  HOME CARE INSTRUCTIONS  Do not eat or drink anything until the numbing medicine (local anesthetic) has worn off and your gag reflex has returned. You will know that the local anesthetic has worn off when you can swallow comfortably.  Do not drive for 12 hours after the procedure or as directed by your caregiver.  Only take medicines as directed by your caregiver. SEEK MEDICAL CARE IF:   You cannot stop coughing.  You are not urinating at all or less than usual. SEEK IMMEDIATE MEDICAL CARE IF:  You have difficulty swallowing.  You cannot eat or drink.  You have worsening throat or chest pain.  You have dizziness, lightheadedness, or you faint.  You have nausea or vomiting.  You have chills.  You have a fever.  You have severe abdominal pain.  You have black, tarry, or bloody stools. Document Released: 10/15/2012 Document Reviewed: 10/15/2012 [facility] Patient Information 2015 [employer]. This information is not intended to replace advice given to you by your health care provider. Make sure you discuss any questions you have with your health care provider.

## 2014-09-10 NOTE — H&P (Signed)
Cheryl Cross is an 48 y.o. female.   Chief Complaint: Patient here for EGD. HPI: Patient is 48 year old Caucasian female with multiple medical problems presents with three-month history of epigastric pain and nausea. Most of her pain occurs with certain movements. She also complains of fullness. She denies history of blunt injury to abdominal wall. She denies vomiting hematemesis melena or weight loss. She does not take OTC NSAIDs.  Past Medical History  Diagnosis Date  . Polycythemia vera(238.4) 12/07/2011  . Fibromyalgia   . Polycythemia vera(238.4)   . Bursitis     hips bilat  . Arthritis     back   . DDD (degenerative disc disease)   . Overactive bladder   . Depression   . PTSD (post-traumatic stress disorder)   . Anxiety   . Polycythemia vera(238.4)   . Obsessive-compulsive disorder     Past Surgical History  Procedure Laterality Date  . Abdominal hysterectomy    . Lumbar fusion      Family History  Problem Relation Age of Onset  . Cancer - Colon Mother     age 70  . Anxiety disorder Mother   . Bipolar disorder Neg Hx   . Dementia Neg Hx   . Drug abuse Neg Hx   . Paranoid behavior Neg Hx   . Schizophrenia Neg Hx   . Seizures Neg Hx   . Sexual abuse Neg Hx   . Physical abuse Neg Hx    Social History:  reports that she has been smoking Cigarettes.  She has a 15 pack-year smoking history. She has never used smokeless tobacco. She reports that she does not drink alcohol or use illicit drugs.  Allergies:  Allergies  Allergen Reactions  . Cyclobenzaprine     Hot sensation  . Diclofenac Sodium     REACTION: Bad itch  . Gabapentin Hives  . Penicillins     unknown  . Robaxin [Methocarbamol] Hives  . Voltaren [Diclofenac Sodium] Itching    Medications Prior to Admission  Medication Sig Dispense Refill  . aspirin 81 MG tablet Take 1 tablet (81 mg total) by mouth daily. For Polycythemia Vera.      . dicyclomine (BENTYL) 10 MG capsule Take 10 mg by mouth  every other day.      Marland Kitchen LIDODERM 5 % Place 1 patch onto the skin once a week.       . pantoprazole (PROTONIX) 40 MG tablet Take 40 mg by mouth every other day.      . sertraline (ZOLOFT) 100 MG tablet Take 1.5 tablets (150 mg total) by mouth daily.  45 tablet  2  . traZODone (DESYREL) 50 MG tablet Take 1 tablet (50 mg total) by mouth at bedtime. For sleep.  30 tablet  2    No results found for this or any previous visit (from the past 48 hour(s)). No results found.  ROS  Blood pressure 131/73, pulse 68, temperature 98.2 F (36.8 C), temperature source Oral, resp. rate 18, height 5\' 7"  (1.702 m), weight 153 lb (69.4 kg), SpO2 100.00%. Physical Exam  Constitutional: She appears well-developed and well-nourished.  HENT:  Mouth/Throat: Oropharynx is clear and moist.  Eyes: Conjunctivae are normal. No scleral icterus.  Neck: No thyromegaly present.  Cardiovascular: Normal rate, regular rhythm and normal heart sounds.   Respiratory: Effort normal and breath sounds normal.  GI: Soft. Tenderness: mild midepigastric tenderness both in superficial and deep palpation.  Musculoskeletal: She exhibits no edema.  Lymphadenopathy:  She has no cervical adenopathy.  Neurological: She is alert.  Skin: Skin is warm and dry.     Assessment/Plan Epigastric pain and nausea. Diagnostic EGD.  Jonna Dittrich U 09/10/2014, 2:44 PM

## 2014-09-11 LAB — CLOTEST (H. PYLORI), BIOPSY: HELICOBACTER SCREEN: NEGATIVE — AB

## 2014-09-14 ENCOUNTER — Encounter (HOSPITAL_COMMUNITY): Payer: Self-pay

## 2014-09-15 ENCOUNTER — Encounter (HOSPITAL_COMMUNITY): Payer: Self-pay | Admitting: Psychiatry

## 2014-09-15 ENCOUNTER — Ambulatory Visit (INDEPENDENT_AMBULATORY_CARE_PROVIDER_SITE_OTHER): Payer: BC Managed Care – PPO | Admitting: Psychiatry

## 2014-09-15 VITALS — BP 138/85 | HR 65 | Ht 67.0 in | Wt 154.0 lb

## 2014-09-15 DIAGNOSIS — F1994 Other psychoactive substance use, unspecified with psychoactive substance-induced mood disorder: Secondary | ICD-10-CM

## 2014-09-15 DIAGNOSIS — F429 Obsessive-compulsive disorder, unspecified: Secondary | ICD-10-CM

## 2014-09-15 DIAGNOSIS — F5105 Insomnia due to other mental disorder: Secondary | ICD-10-CM

## 2014-09-15 DIAGNOSIS — F321 Major depressive disorder, single episode, moderate: Secondary | ICD-10-CM

## 2014-09-15 DIAGNOSIS — F418 Other specified anxiety disorders: Secondary | ICD-10-CM

## 2014-09-15 MED ORDER — TRAZODONE HCL 50 MG PO TABS: 50.0000 mg | ORAL_TABLET | Freq: Every day | ORAL | Status: DC

## 2014-09-15 MED ORDER — SERTRALINE HCL 100 MG PO TABS: 150.0000 mg | ORAL_TABLET | Freq: Every day | ORAL | Status: DC

## 2014-09-15 NOTE — Progress Notes (Signed)
Patient ID: ALEXIANNA NACHREINER, female   DOB: 20-May-1966, 48 y.o.   MRN: [id number] Patient ID: ARYAN BELLO, female   DOB: Nov 16, 1965, 48 y.o.   MRN: [id number] Patient ID: ELLOISE ROARK, female   DOB: 17-Dec-1965, 48 y.o.   MRN: [id number] Patient ID: HARUKA KOWALESKI, female   DOB: 05-23-1966, 48 y.o.   MRN: [id number] Patient ID: RUHAMA LEHEW, female   DOB: Jun 22, 1966, 48 y.o.   MRN: [id number] [facility] MD Progress Note [id number] HASINI PEACHEY  MRN:  [id number] DOB:: 01/14/1966 Age: 48 y.o. Date: 09/15/2014  Chief Complaint: Chief Complaint  Patient presents with  . Depression  . Anxiety  . Follow-up   History of present illness This patient is a 48 year old separated white female who lives alone in [place]. 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  The patient stated that last year in May she got extremely depressed and became suicidal. She had back pain and fibromyalgia. She got hooked on taking too many Xanax and was sleeping all the time and unable to function. [facility] and got off the Xanax and since then has been on trazodone and Zoloft. 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 She sees Dr. Jefm Miles here which she finds very helpful. She denies being depressed and stays on a good regimen of eating and sleeping. Her mood is generally been good and she's not significantly anxious. She's not abusing any substances and she denies suicidal ideation  The patient returns after 3 months. She has been having more trouble with her abdominal pain. She had an upper GI last week but doesn't have the results yet. Her GI specialist is wondering if the trazodone is causing spasm but I think this is quite unlikely given that she's been on it for years. She really doesn't want to change her medication because her mood is stable and she's able to function well and stay away  from  substance abuse.she's not enjoying her new job as much as she had hoped as a children there are very challenging and this may have been contributing to stress. I explained that stress can often make GI problems worse  Diagnosis:   Axis I: Major Depression, Recurrent severe, Substance Abuse and Substance Induced Mood Disorder Axis II: Deferred Axis III:  Past Medical History  Diagnosis Date  . Polycythemia vera(238.4) 12/07/2011  . Fibromyalgia   . Polycythemia vera(238.4)   . Bursitis     hips bilat  . Arthritis     back   . DDD (degenerative disc disease)   . Overactive bladder   . Depression   . PTSD (post-traumatic stress disorder)   . Anxiety   . Polycythemia vera(238.4)   . Obsessive-compulsive disorder    Axis IV: other psychosocial or environmental problems Axis V: 51-60 moderate symptoms  ADL's:  Intact  Sleep: Good, thanks to Trazodone  Appetite:  Fair  Suicidal Ideation:  Pt denies any thoughts, plans, intent of suicide\ Homicidal Ideation:  Pt denies any thoughts, plans, intent of homicide  AEB (as evidenced by):per pt report  Psychiatric Specialty Exam: ROS Patient ID: SHAYLYN BAWA, female   DOB: May 20, 1966, 48 y.o.   MRN: [id number] Patient ID: ROSANN GORUM, female   DOB: Feb 17, 1966, 48 y.o.   MRN: [id number] Patient ID: KYRA LAFFEY, female   DOB: 1966/06/05, 48 y.o.   MRN: [id number] [facility] MD Progress Note [id number] Dionisia  DOSHIA DALIA  MRN:  [id number] DOB:: June 22, 1966 Age: 48 y.o. Date: 09/15/2014  Chief Complaint: Chief Complaint  Patient presents with  . Depression  . Anxiety  . Follow-up   t Diagnosis:   Axis I: Major Depression, Recurrent severe, Substance Abuse and Substance Induced Mood Disorder Axis II: Deferred Axis III:  Past Medical History  Diagnosis Date  . Polycythemia vera(238.4) 12/07/2011  . Fibromyalgia   . Polycythemia vera(238.4)   . Bursitis     hips bilat  . Arthritis     back   . DDD  (degenerative disc disease)   . Overactive bladder   . Depression   . PTSD (post-traumatic stress disorder)   . Anxiety   . Polycythemia vera(238.4)   . Obsessive-compulsive disorder    Axis IV: other psychosocial or environmental problems Axis V: 51-60 moderate symptoms  ADL's:  Intact  Sleep: Good, thanks to Trazodone  Appetite:  Fair  Suicidal Ideation:  Pt denies any thoughts, plans, intent of suicide\ Homicidal Ideation:  Pt denies any thoughts, plans, intent of homicide  AEB (as evidenced by):per pt report  Psychiatric Specialty Exam: ROS  Blood pressure 138/85, pulse 65, height _0  (1.702 m), weight 154 lb (69.854 kg).Body mass index is 24.11 kg/(m^2).  General Appearance: Casual  Eye Contact::  Good  Speech:  Clear and Coherent  Volume:  Normal  Mood: good but somewhat anxious  Affect:  Congruent  Thought Process:  Coherent, Linear and Logical  Orientation:  Full (Time, Place, and Person)  Thought Content:  WDL  Suicidal Thoughts:  No  Homicidal Thoughts:  No  Memory:  Immediate;   Good Recent;   Good Remote;   Good  Judgement:  Good  Insight:  Good  Psychomotor Activity:  TYpical  Concentration:  Good  Recall:  Good  Akathisia:  No  Handed:  Right  AIMS (if indicated):     Assets:  Communication Skills Desire for Improvement  Sleep:      Current Medications: Current Outpatient Prescriptions  Medication Sig Dispense Refill  . aspirin 81 MG tablet Take 1 tablet (81 mg total) by mouth daily. For Polycythemia Vera.    . dicyclomine (BENTYL) 10 MG capsule Take 10 mg by mouth every other day.    Marland Kitchen LIDODERM 5 % Place 1 patch onto the skin once a week.     . pantoprazole (PROTONIX) 40 MG tablet Take 40 mg by mouth every other day.    . sertraline (ZOLOFT) 100 MG tablet Take 1.5 tablets (150 mg total) by mouth daily. 45 tablet 2  . traZODone (DESYREL) 50 MG tablet Take 1 tablet (50 mg total) by mouth at bedtime. For sleep. 30 tablet 2   No current  facility-administered medications for this visit.   Lab Results:  Results for orders placed or performed during the hospital encounter of 09/10/14 (from the past 8736 hour(s))  Clotest (H. pylori), biopsy   Collection Time: 09/10/14  3:00 PM  Result Value Ref Range   Helicobacter screen NEGATIVE (A) UREASE NEGATIVE  Results for orders placed or performed during the hospital encounter of 06/17/14 (from the past 8736 hour(s))  CBC with Differential   Collection Time: 06/17/14  1:05 PM  Result Value Ref Range   WBC 7.3 4.0 - 10.5 K/uL   RBC 4.55 3.87 - 5.11 MIL/uL   Hemoglobin 13.8 12.0 - 15.0 g/dL   HCT 41.2 36.0 - 46.0 %   MCV 90.5 78.0 - 100.0 fL   MCH  30.3 26.0 - 34.0 pg   MCHC 33.5 30.0 - 36.0 g/dL   RDW 13.5 11.5 - 15.5 %   Platelets 311 150 - 400 K/uL   Neutrophils Relative % 59 43 - 77 %   Neutro Abs 4.3 1.7 - 7.7 K/uL   Lymphocytes Relative 33 12 - 46 %   Lymphs Abs 2.4 0.7 - 4.0 K/uL   Monocytes Relative 5 3 - 12 %   Monocytes Absolute 0.4 0.1 - 1.0 K/uL   Eosinophils Relative 3 0 - 5 %   Eosinophils Absolute 0.2 0.0 - 0.7 K/uL   Basophils Relative 0 0 - 1 %   Basophils Absolute 0.0 0.0 - 0.1 K/uL  Results for orders placed or performed in visit on 05/31/14 (from the past 8736 hour(s))  CMP with Estimated GFR ([id number])   Collection Time: 05/31/14 10:00 AM  Result Value Ref Range   Sodium 137 135 - 145 mEq/L   Potassium 3.8 3.5 - 5.3 mEq/L   Chloride 104 96 - 112 mEq/L   CO2 27 19 - 32 mEq/L   Glucose, Bld 83 70 - 99 mg/dL   BUN 3 (L) 6 - 23 mg/dL   Creat 0.57 0.50 - 1.10 mg/dL   Total Bilirubin 0.4 0.2 - 1.2 mg/dL   Alkaline Phosphatase 44 39 - 117 U/L   AST 9 0 - 37 U/L   ALT <8 0 - 35 U/L   Total Protein 6.3 6.0 - 8.3 g/dL   Albumin 4.1 3.5 - 5.2 g/dL   Calcium 8.8 8.4 - 10.5 mg/dL   GFR, Est African American >89 mL/min   GFR, Est Non African American >89 mL/min  Ferritin   Collection Time: 05/31/14 10:00 AM  Result Value Ref Range   Ferritin 10 10 -  291 ng/mL  CBC with Diff   Collection Time: 05/31/14 10:00 AM  Result Value Ref Range   WBC 6.1 4.0 - 10.5 K/uL   RBC 4.43 3.87 - 5.11 MIL/uL   Hemoglobin 13.4 12.0 - 15.0 g/dL   HCT 40.1 36.0 - 46.0 %   MCV 90.5 78.0 - 100.0 fL   MCH 30.2 26.0 - 34.0 pg   MCHC 33.4 30.0 - 36.0 g/dL   RDW 14.4 11.5 - 15.5 %   Platelets 299 150 - 400 K/uL   Neutrophils Relative % 63 43 - 77 %   Neutro Abs 3.8 1.7 - 7.7 K/uL   Lymphocytes Relative 28 12 - 46 %   Lymphs Abs 1.7 0.7 - 4.0 K/uL   Monocytes Relative 7 3 - 12 %   Monocytes Absolute 0.4 0.1 - 1.0 K/uL   Eosinophils Relative 2 0 - 5 %   Eosinophils Absolute 0.1 0.0 - 0.7 K/uL   Basophils Relative 0 0 - 1 %   Basophils Absolute 0.0 0.0 - 0.1 K/uL   Smear Review Criteria for review not met   Results for orders placed or performed during the hospital encounter of 02/11/14 (from the past 8736 hour(s))  CBC   Collection Time: 02/11/14  1:20 PM  Result Value Ref Range   WBC 6.5 4.0 - 10.5 K/uL   RBC 4.28 3.87 - 5.11 MIL/uL   Hemoglobin 13.0 12.0 - 15.0 g/dL   HCT 38.3 36.0 - 46.0 %   MCV 89.5 78.0 - 100.0 fL   MCH 30.4 26.0 - 34.0 pg   MCHC 33.9 30.0 - 36.0 g/dL   RDW 13.5 11.5 - 15.5 %   Platelets  281 150 - 400 K/uL  Results for orders placed or performed during the hospital encounter of 09/22/13 (from the past 8736 hour(s))  CBC   Collection Time: 09/22/13 10:17 AM  Result Value Ref Range   WBC 6.3 4.0 - 10.5 K/uL   RBC 4.50 3.87 - 5.11 MIL/uL   Hemoglobin 14.0 12.0 - 15.0 g/dL   HCT 40.9 36.0 - 46.0 %   MCV 90.9 78.0 - 100.0 fL   MCH 31.1 26.0 - 34.0 pg   MCHC 34.2 30.0 - 36.0 g/dL   RDW 14.1 11.5 - 15.5 %   Platelets 324 150 - 400 K/uL   Physical Findings: AIMS:  , ,  ,  ,    CIWA:    COWS:     Treatment Plan Summary: Medication management  Plan: I will continue Zoloft and trazodone .trazodone can be anticholinergic and cause constipation but she's not having this particular symptom. She's going to discuss medications  further with her GI specialist..  Recommend to call us back if she has any question concerns or if she feels worsening symptoms.  Followup in 3 months.  MEDICATIONS this encounter: Meds ordered this encounter  Medications  . traZODone (DESYREL) 50 MG tablet    Sig: Take 1 tablet (50 mg total) by mouth at bedtime. For sleep.    Dispense:  30 tablet    Refill:  2  . sertraline (ZOLOFT) 100 MG tablet    Sig: Take 1.5 tablets (150 mg total) by mouth daily.    Dispense:  45 tablet    Refill:  2    Medical Decision Making Problem Points:  Established problem, stable/improving (1) and Review of last therapy session (1) Data Points:  Review or order clinical lab tests (1) Review of medication regiment & side effects (2)  ROSS, [place] 09/15/2014, 4:40 PM

## 2014-10-18 ENCOUNTER — Telehealth: Payer: Self-pay | Admitting: *Deleted

## 2014-10-18 NOTE — Telephone Encounter (Signed)
Call from [facility]- needs new orders for theraupetic phlebotomy; has an appt on Friday 12/11. [phone] Thanks

## 2014-10-19 ENCOUNTER — Other Ambulatory Visit: Payer: Self-pay | Admitting: Oncology

## 2014-10-19 DIAGNOSIS — D45 Polycythemia vera: Secondary | ICD-10-CM

## 2014-10-19 NOTE — Telephone Encounter (Signed)
B.states she is able to see the new order. Thanks

## 2014-10-19 NOTE — Telephone Encounter (Signed)
I put in orders  Please call [facility] short stay to see if they can see them.  i will be travelling today so can't check epic until 9 PM tonite

## 2014-10-22 ENCOUNTER — Encounter (HOSPITAL_COMMUNITY): Payer: Self-pay

## 2014-10-22 ENCOUNTER — Encounter (HOSPITAL_COMMUNITY)
Admission: RE | Admit: 2014-10-22 | Discharge: 2014-10-22 | Disposition: A | Discharge status: Home / Self Care | Payer: BC Managed Care – PPO | Source: Ambulatory Visit | Attending: Oncology | Admitting: Oncology

## 2014-10-22 ENCOUNTER — Other Ambulatory Visit (HOSPITAL_COMMUNITY): Payer: Self-pay | Admitting: Oncology

## 2014-10-22 VITALS — BP 120/73 | HR 77 | Temp 98.1°F | Resp 20 | Ht 67.0 in | Wt 150.0 lb

## 2014-10-22 DIAGNOSIS — D45 Polycythemia vera: Secondary | ICD-10-CM

## 2014-10-22 LAB — HEMOGLOBIN AND HEMATOCRIT, BLOOD
HCT: 39.5 % (ref 36.0–46.0)
HEMOGLOBIN: 13.1 g/dL (ref 12.0–15.0)

## 2014-10-22 NOTE — Procedures (Signed)
Therapeutic Phlebotomy performed of 500 gms. Tolerated well

## 2014-12-14 ENCOUNTER — Telehealth (HOSPITAL_COMMUNITY): Payer: Self-pay | Admitting: *Deleted

## 2014-12-14 ENCOUNTER — Other Ambulatory Visit (HOSPITAL_COMMUNITY): Payer: Self-pay | Admitting: Psychiatry

## 2014-12-14 DIAGNOSIS — F418 Other specified anxiety disorders: Secondary | ICD-10-CM

## 2014-12-14 DIAGNOSIS — F5105 Insomnia due to other mental disorder: Principal | ICD-10-CM

## 2014-12-14 MED ORDER — TRAZODONE HCL 50 MG PO TABS: 50.0000 mg | ORAL_TABLET | Freq: Every day | ORAL | Status: DC

## 2014-12-14 NOTE — Telephone Encounter (Signed)
sent 

## 2014-12-14 NOTE — Telephone Encounter (Signed)
Pt pharmacy requesting refills for pt Trazodone. Pt medication was last filled 09-15-14 with 30 tablets 2 refills. Pt f/u appt is scheduled for 12-16-14. Pharmacy number is [phone]

## 2014-12-16 ENCOUNTER — Ambulatory Visit (INDEPENDENT_AMBULATORY_CARE_PROVIDER_SITE_OTHER): Payer: BC Managed Care – PPO | Admitting: Psychiatry

## 2014-12-16 ENCOUNTER — Encounter (HOSPITAL_COMMUNITY): Payer: Self-pay | Admitting: Psychiatry

## 2014-12-16 VITALS — BP 120/68 | HR 75 | Ht 67.0 in | Wt 154.0 lb

## 2014-12-16 DIAGNOSIS — F429 Obsessive-compulsive disorder, unspecified: Secondary | ICD-10-CM

## 2014-12-16 DIAGNOSIS — F332 Major depressive disorder, recurrent severe without psychotic features: Secondary | ICD-10-CM

## 2014-12-16 DIAGNOSIS — F418 Other specified anxiety disorders: Secondary | ICD-10-CM

## 2014-12-16 DIAGNOSIS — F5105 Insomnia due to other mental disorder: Secondary | ICD-10-CM

## 2014-12-16 DIAGNOSIS — F321 Major depressive disorder, single episode, moderate: Secondary | ICD-10-CM

## 2014-12-16 DIAGNOSIS — F1994 Other psychoactive substance use, unspecified with psychoactive substance-induced mood disorder: Secondary | ICD-10-CM

## 2014-12-16 MED ORDER — SERTRALINE HCL 100 MG PO TABS: 150.0000 mg | ORAL_TABLET | Freq: Every day | ORAL | Status: DC

## 2014-12-16 MED ORDER — TRAZODONE HCL 50 MG PO TABS: 50.0000 mg | ORAL_TABLET | Freq: Every day | ORAL | Status: DC

## 2014-12-16 NOTE — Progress Notes (Signed)
Patient ID: KARITA DRALLE, female   DOB: 1966-08-20, 49 y.o.   MRN: [id number] Patient ID: LYNNSEY BARBARA, female   DOB: August 04, 1966, 49 y.o.   MRN: [id number] Patient ID: TOMOKO SANDRA, female   DOB: 07-24-1966, 49 y.o.   MRN: [id number] Patient ID: JENAE TOMASELLO, female   DOB: [birth year], 49 y.o.   MRN: [id number] Patient ID: ADELIS DOCTER, female   DOB: [birth year], 49 y.o.   MRN: [id number] Patient ID: TAREKA JHAVERI, female   DOB: 08-Jun-1966, 49 y.o.   MRN: [id number] [facility] MD Progress Note [id number] KANITA DELAGE  MRN:  [id number] DOB:: [birth year] Age: 49 y.o. Date: 12/16/2014  Chief Complaint: Chief Complaint  Patient presents with  . Depression  . Anxiety  . Follow-up   History of present illness This patient is a 50 year old separated white female who lives alone in [place]. 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 She had back pain and fibromyalgia. She got hooked on taking too many Xanax and was sleeping all the time and unable to function. [facility] and got off the Xanax and since then has been on trazodone and Zoloft. 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 She sees Dr. Jefm Miles here which she finds very helpful. She denies being depressed and stays on a good regimen of eating and sleeping. Her mood is generally been good and she's not significantly anxious. She's not abusing any substances and she denies suicidal ideation  The patient returns after 3 months. She has been doing well. She is adjusting to her new school without difficulty. Her mood is good. She is sleeping well. She's not having any relapses into substance abuse. She feels like her medications have been very helpful  Diagnosis:   Axis I: Major Depression, Recurrent severe, Substance Abuse and Substance  Induced Mood Disorder Axis II: Deferred Axis III:  Past Medical History  Diagnosis Date  . Polycythemia vera(238.4) 12/07/2011  . Fibromyalgia   . Polycythemia vera(238.4)   . Bursitis     hips bilat  . Arthritis     back   . DDD (degenerative disc disease)   . Overactive bladder   . Depression   . PTSD (post-traumatic stress disorder)   . Anxiety   . Polycythemia vera(238.4)   . Obsessive-compulsive disorder    Axis IV: other psychosocial or environmental problems Axis V: 51-60 moderate symptoms  ADL's:  Intact  Sleep: Good, thanks to Trazodone  Appetite:  Fair  Suicidal Ideation:  Pt denies any thoughts, plans, intent of suicide\ Homicidal Ideation:  Pt denies any thoughts, plans, intent of homicide  AEB (as evidenced by):per pt report  Psychiatric Specialty Exam: ROS Patient ID: CARMIE LANPHER, female   DOB: 07/19/1966, 49 y.o.   MRN: [id number] Patient ID: JONNI OELKERS, female   DOB: 10/25/1966, 49 y.o.   MRN: [id number] Patient ID: YESSENIA MAILLET, female   DOB: 11/24/1965, 49 y.o.   MRN: [id number] [facility] MD Progress Note [id number] PRUDY CANDY  MRN:  [id number] DOB:: 1966-05-29 Age: 49 y.o. Date: 12/16/2014  Chief Complaint: Chief Complaint  Patient presents with  . Depression  . Anxiety  . Follow-up   t Diagnosis:   Axis I: Major Depression, Recurrent severe, Substance Abuse and Substance Induced Mood Disorder Axis II: Deferred Axis III:  Past Medical  History  Diagnosis Date  . Polycythemia vera(238.4) 12/07/2011  . Fibromyalgia   . Polycythemia vera(238.4)   . Bursitis     hips bilat  . Arthritis     back   . DDD (degenerative disc disease)   . Overactive bladder   . Depression   . PTSD (post-traumatic stress disorder)   . Anxiety   . Polycythemia vera(238.4)   . Obsessive-compulsive disorder    Axis IV: other psychosocial or environmental problems Axis V: 51-60 moderate symptoms  ADL's:  Intact  Sleep: Good, thanks to  Trazodone  Appetite:  Fair  Suicidal Ideation:  Pt denies any thoughts, plans, intent of suicide\ Homicidal Ideation:  Pt denies any thoughts, plans, intent of homicide  AEB (as evidenced by):per pt report  Psychiatric Specialty Exam: ROS  Blood pressure 120/68, pulse 75, height '5\' 7"'  (1.702 m), weight 154 lb (69.854 kg).Body mass index is 24.11 kg/(m^2).  General Appearance: Casual  Eye Contact::  Good  Speech:  Clear and Coherent  Volume:  Normal  Mood: good   Affect:  Congruent  Thought Process:  Coherent, Linear and Logical  Orientation:  Full (Time, Place, and Person)  Thought Content:  WDL  Suicidal Thoughts:  No  Homicidal Thoughts:  No  Memory:  Immediate;   Good Recent;   Good Remote;   Good  Judgement:  Good  Insight:  Good  Psychomotor Activity:  TYpical  Concentration:  Good  Recall:  Good  Akathisia:  No  Handed:  Right  AIMS (if indicated):     Assets:  Communication Skills Desire for Improvement  Sleep:      Current Medications: Current Outpatient Prescriptions  Medication Sig Dispense Refill  . aspirin 81 MG tablet Take 1 tablet (81 mg total) by mouth daily. For Polycythemia Vera.    . dicyclomine (BENTYL) 10 MG capsule Take 10 mg by mouth every other day.    Marland Kitchen LIDODERM 5 % Place 1 patch onto the skin once a week.     . pantoprazole (PROTONIX) 40 MG tablet Take 40 mg by mouth every other day.    . sertraline (ZOLOFT) 100 MG tablet Take 1.5 tablets (150 mg total) by mouth daily. 45 tablet 2  . traZODone (DESYREL) 50 MG tablet Take 1 tablet (50 mg total) by mouth at bedtime. For sleep. 30 tablet 2   No current facility-administered medications for this visit.   Lab Results:  Results for orders placed or performed during the hospital encounter of 10/22/14 (from the past 8736 hour(s))  Hemoglobin and hematocrit, blood   Collection Time: 10/22/14 11:20 AM  Result Value Ref Range   Hemoglobin 13.1 12.0 - 15.0 g/dL   HCT 39.5 36.0 - 46.0 %  Results  for orders placed or performed during the hospital encounter of 09/10/14 (from the past 8736 hour(s))  Clotest (H. pylori), biopsy   Collection Time: 09/10/14  3:00 PM  Result Value Ref Range   Helicobacter screen NEGATIVE (A) UREASE NEGATIVE  Results for orders placed or performed during the hospital encounter of 06/17/14 (from the past 8736 hour(s))  CBC with Differential   Collection Time: 06/17/14  1:05 PM  Result Value Ref Range   WBC 7.3 4.0 - 10.5 K/uL   RBC 4.55 3.87 - 5.11 MIL/uL   Hemoglobin 13.8 12.0 - 15.0 g/dL   HCT 41.2 36.0 - 46.0 %   MCV 90.5 78.0 - 100.0 fL   MCH 30.3 26.0 - 34.0 pg   MCHC 33.5  30.0 - 36.0 g/dL   RDW 13.5 11.5 - 15.5 %   Platelets 311 150 - 400 K/uL   Neutrophils Relative % 59 43 - 77 %   Neutro Abs 4.3 1.7 - 7.7 K/uL   Lymphocytes Relative 33 12 - 46 %   Lymphs Abs 2.4 0.7 - 4.0 K/uL   Monocytes Relative 5 3 - 12 %   Monocytes Absolute 0.4 0.1 - 1.0 K/uL   Eosinophils Relative 3 0 - 5 %   Eosinophils Absolute 0.2 0.0 - 0.7 K/uL   Basophils Relative 0 0 - 1 %   Basophils Absolute 0.0 0.0 - 0.1 K/uL  Results for orders placed or performed in visit on 05/31/14 (from the past 8736 hour(s))  CMP with Estimated GFR ([id number])   Collection Time: 05/31/14 10:00 AM  Result Value Ref Range   Sodium 137 135 - 145 mEq/L   Potassium 3.8 3.5 - 5.3 mEq/L   Chloride 104 96 - 112 mEq/L   CO2 27 19 - 32 mEq/L   Glucose, Bld 83 70 - 99 mg/dL   BUN 3 (L) 6 - 23 mg/dL   Creat 0.57 0.50 - 1.10 mg/dL   Total Bilirubin 0.4 0.2 - 1.2 mg/dL   Alkaline Phosphatase 44 39 - 117 U/L   AST 9 0 - 37 U/L   ALT <8 0 - 35 U/L   Total Protein 6.3 6.0 - 8.3 g/dL   Albumin 4.1 3.5 - 5.2 g/dL   Calcium 8.8 8.4 - 10.5 mg/dL   GFR, Est African American >89 mL/min   GFR, Est Non African American >89 mL/min  Ferritin   Collection Time: 05/31/14 10:00 AM  Result Value Ref Range   Ferritin 10 10 - 291 ng/mL  CBC with Diff   Collection Time: 05/31/14 10:00 AM  Result Value  Ref Range   WBC 6.1 4.0 - 10.5 K/uL   RBC 4.43 3.87 - 5.11 MIL/uL   Hemoglobin 13.4 12.0 - 15.0 g/dL   HCT 40.1 36.0 - 46.0 %   MCV 90.5 78.0 - 100.0 fL   MCH 30.2 26.0 - 34.0 pg   MCHC 33.4 30.0 - 36.0 g/dL   RDW 14.4 11.5 - 15.5 %   Platelets 299 150 - 400 K/uL   Neutrophils Relative % 63 43 - 77 %   Neutro Abs 3.8 1.7 - 7.7 K/uL   Lymphocytes Relative 28 12 - 46 %   Lymphs Abs 1.7 0.7 - 4.0 K/uL   Monocytes Relative 7 3 - 12 %   Monocytes Absolute 0.4 0.1 - 1.0 K/uL   Eosinophils Relative 2 0 - 5 %   Eosinophils Absolute 0.1 0.0 - 0.7 K/uL   Basophils Relative 0 0 - 1 %   Basophils Absolute 0.0 0.0 - 0.1 K/uL   Smear Review Criteria for review not met   Results for orders placed or performed during the hospital encounter of 02/11/14 (from the past 8736 hour(s))  CBC   Collection Time: 02/11/14  1:20 PM  Result Value Ref Range   WBC 6.5 4.0 - 10.5 K/uL   RBC 4.28 3.87 - 5.11 MIL/uL   Hemoglobin 13.0 12.0 - 15.0 g/dL   HCT 38.3 36.0 - 46.0 %   MCV 89.5 78.0 - 100.0 fL   MCH 30.4 26.0 - 34.0 pg   MCHC 33.9 30.0 - 36.0 g/dL   RDW 13.5 11.5 - 15.5 %   Platelets 281 150 - 400 K/uL   Physical Findings:  AIMS:  , ,  ,  ,    CIWA:    COWS:     Treatment Plan Summary: Medication management  Plan: I will continue Zoloft and trazodone.  Recommend to call us back if she has any question concerns or if she feels worsening symptoms.  Followup in 3 months.  MEDICATIONS this encounter: Meds ordered this encounter  Medications  . traZODone (DESYREL) 50 MG tablet    Sig: Take 1 tablet (50 mg total) by mouth at bedtime. For sleep.    Dispense:  30 tablet    Refill:  2  . sertraline (ZOLOFT) 100 MG tablet    Sig: Take 1.5 tablets (150 mg total) by mouth daily.    Dispense:  45 tablet    Refill:  2    Medical Decision Making Problem Points:  Established problem, stable/improving (1) and Review of last therapy session (1) Data Points:  Review or order clinical lab tests  (1) Review of medication regiment & side effects (2)  Godwin Tedesco, [place] 12/16/2014, 4:48 PM

## 2015-01-10 ENCOUNTER — Other Ambulatory Visit (HOSPITAL_COMMUNITY): Payer: Self-pay | Admitting: Family Medicine

## 2015-01-10 DIAGNOSIS — Z1231 Encounter for screening mammogram for malignant neoplasm of breast: Secondary | ICD-10-CM

## 2015-02-08 ENCOUNTER — Other Ambulatory Visit: Payer: Self-pay

## 2015-02-10 LAB — CYTOLOGY - PAP

## 2015-02-14 ENCOUNTER — Ambulatory Visit (HOSPITAL_COMMUNITY): Payer: Self-pay

## 2015-02-23 ENCOUNTER — Other Ambulatory Visit: Payer: Self-pay | Admitting: Oncology

## 2015-02-25 ENCOUNTER — Encounter (HOSPITAL_COMMUNITY): Payer: Self-pay

## 2015-02-25 ENCOUNTER — Encounter (HOSPITAL_COMMUNITY)
Admission: RE | Admit: 2015-02-25 | Discharge: 2015-02-25 | Disposition: A | Discharge status: Home / Self Care | Payer: BC Managed Care – PPO | Source: Ambulatory Visit | Attending: Oncology | Admitting: Oncology

## 2015-02-25 DIAGNOSIS — D45 Polycythemia vera: Secondary | ICD-10-CM | POA: Diagnosis present

## 2015-02-25 LAB — HEMOGLOBIN AND HEMATOCRIT, BLOOD
HCT: 39.9 % (ref 36.0–46.0)
HEMOGLOBIN: 13 g/dL (ref 12.0–15.0)

## 2015-02-25 NOTE — Discharge Instructions (Signed)

## 2015-02-25 NOTE — Procedures (Signed)
#  18 started to rt wrist, hgb drawn and result was 13.0.  Order reads to do phlebotomy if hgb greater than or = 12.  Started drawing blood out at 1115 and completed at 1140.  517ml blood drawn off pt.  Pt tolerated well, no dizziness reported. Vss, afebrile.  Pt d/c home ambulatory.

## 2015-03-11 ENCOUNTER — Encounter (HOSPITAL_COMMUNITY): Payer: Self-pay | Admitting: Emergency Medicine

## 2015-03-11 ENCOUNTER — Emergency Department (HOSPITAL_COMMUNITY)
Admission: EM | Admit: 2015-03-11 | Discharge: 2015-03-11 | Disposition: A | Discharge status: Home / Self Care | Payer: BC Managed Care – PPO | Attending: Emergency Medicine | Admitting: Emergency Medicine

## 2015-03-11 ENCOUNTER — Emergency Department (HOSPITAL_COMMUNITY): Payer: BC Managed Care – PPO

## 2015-03-11 DIAGNOSIS — K029 Dental caries, unspecified: Secondary | ICD-10-CM | POA: Diagnosis not present

## 2015-03-11 DIAGNOSIS — F329 Major depressive disorder, single episode, unspecified: Secondary | ICD-10-CM | POA: Diagnosis not present

## 2015-03-11 DIAGNOSIS — F419 Anxiety disorder, unspecified: Secondary | ICD-10-CM | POA: Diagnosis not present

## 2015-03-11 DIAGNOSIS — Z87448 Personal history of other diseases of urinary system: Secondary | ICD-10-CM | POA: Insufficient documentation

## 2015-03-11 DIAGNOSIS — Z792 Long term (current) use of antibiotics: Secondary | ICD-10-CM | POA: Insufficient documentation

## 2015-03-11 DIAGNOSIS — F42 Obsessive-compulsive disorder: Secondary | ICD-10-CM | POA: Insufficient documentation

## 2015-03-11 DIAGNOSIS — M797 Fibromyalgia: Secondary | ICD-10-CM | POA: Diagnosis not present

## 2015-03-11 DIAGNOSIS — Z862 Personal history of diseases of the blood and blood-forming organs and certain disorders involving the immune mechanism: Secondary | ICD-10-CM | POA: Insufficient documentation

## 2015-03-11 DIAGNOSIS — F4310 Post-traumatic stress disorder, unspecified: Secondary | ICD-10-CM | POA: Insufficient documentation

## 2015-03-11 DIAGNOSIS — Z720 Tobacco use: Secondary | ICD-10-CM | POA: Diagnosis not present

## 2015-03-11 DIAGNOSIS — Z79899 Other long term (current) drug therapy: Secondary | ICD-10-CM | POA: Diagnosis not present

## 2015-03-11 DIAGNOSIS — Z880 Allergy status to penicillin: Secondary | ICD-10-CM | POA: Diagnosis not present

## 2015-03-11 DIAGNOSIS — M1990 Unspecified osteoarthritis, unspecified site: Secondary | ICD-10-CM | POA: Diagnosis not present

## 2015-03-11 DIAGNOSIS — Z7951 Long term (current) use of inhaled steroids: Secondary | ICD-10-CM | POA: Diagnosis not present

## 2015-03-11 DIAGNOSIS — K0889 Other specified disorders of teeth and supporting structures: Secondary | ICD-10-CM

## 2015-03-11 DIAGNOSIS — K088 Other specified disorders of teeth and supporting structures: Secondary | ICD-10-CM | POA: Insufficient documentation

## 2015-03-11 DIAGNOSIS — R1110 Vomiting, unspecified: Secondary | ICD-10-CM | POA: Insufficient documentation

## 2015-03-11 LAB — CBC WITH DIFFERENTIAL/PLATELET
BASOS ABS: 0 10*3/uL (ref 0.0–0.1)
BASOS PCT: 0 % (ref 0–1)
Eosinophils Absolute: 0 10*3/uL (ref 0.0–0.7)
Eosinophils Relative: 0 % (ref 0–5)
HEMATOCRIT: 36.6 % (ref 36.0–46.0)
Hemoglobin: 12.1 g/dL (ref 12.0–15.0)
Lymphocytes Relative: 11 % — ABNORMAL LOW (ref 12–46)
Lymphs Abs: 1.3 10*3/uL (ref 0.7–4.0)
MCH: 28.9 pg (ref 26.0–34.0)
MCHC: 33.1 g/dL (ref 30.0–36.0)
MCV: 87.6 fL (ref 78.0–100.0)
MONO ABS: 0.7 10*3/uL (ref 0.1–1.0)
MONOS PCT: 6 % (ref 3–12)
Neutro Abs: 9.5 10*3/uL — ABNORMAL HIGH (ref 1.7–7.7)
Neutrophils Relative %: 83 % — ABNORMAL HIGH (ref 43–77)
Platelets: 295 10*3/uL (ref 150–400)
RBC: 4.18 MIL/uL (ref 3.87–5.11)
RDW: 14.8 % (ref 11.5–15.5)
WBC: 11.4 10*3/uL — AB (ref 4.0–10.5)

## 2015-03-11 LAB — BASIC METABOLIC PANEL
Anion gap: 7 (ref 5–15)
BUN: 5 mg/dL — ABNORMAL LOW (ref 6–23)
CO2: 27 mmol/L (ref 19–32)
CREATININE: 0.57 mg/dL (ref 0.50–1.10)
Calcium: 8.9 mg/dL (ref 8.4–10.5)
Chloride: 104 mmol/L (ref 96–112)
GLUCOSE: 115 mg/dL — AB (ref 70–99)
Potassium: 3.5 mmol/L (ref 3.5–5.1)
Sodium: 138 mmol/L (ref 135–145)

## 2015-03-11 LAB — LACTIC ACID, PLASMA: LACTIC ACID, VENOUS: 0.8 mmol/L (ref 0.5–2.0)

## 2015-03-11 IMAGING — CT CT MAXILLOFACIAL W/ CM
3 of 4 series · 16 of 47 positions shown, 19 images · IV contrast (Omnipaque 300)
Comparison: None.

CLINICAL DATA: Acute onset of right jaw pain and neck pain. Initial
encounter.

EXAM:
CT MAXILLOFACIAL WITH CONTRAST
TECHNIQUE: Multidetector CT imaging of the maxillofacial structures was
performed with intravenous contrast. Multiplanar CT image
reconstructions were also generated. A small metallic BB was placed
on the right temple in order to reliably differentiate right from
left.
CONTRAST:  75mL OMNIPAQUE IOHEXOL 300 MG/ML  SOLN

[Series 2: max st axial · axial · 0.33mm/px · z∈[+15,+157]mm · 11 of 83 slices shown, 14 images]
[im 6/83  brain]
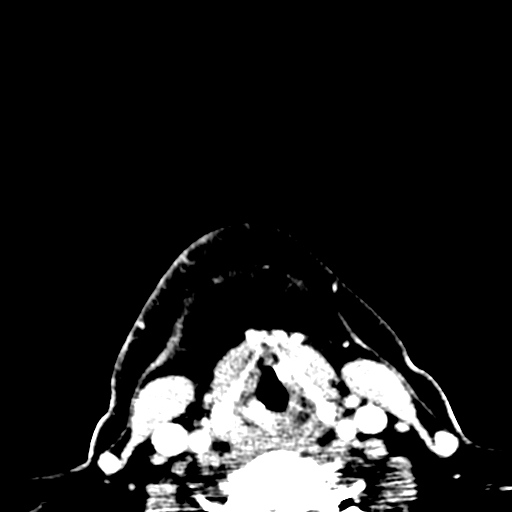
[im 6/83  bone]
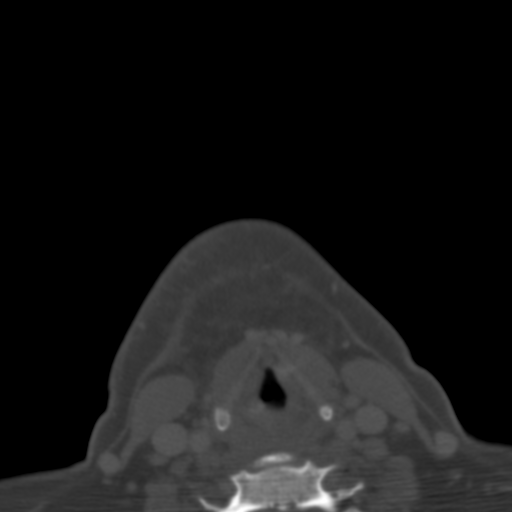
[im 12/83  bone]
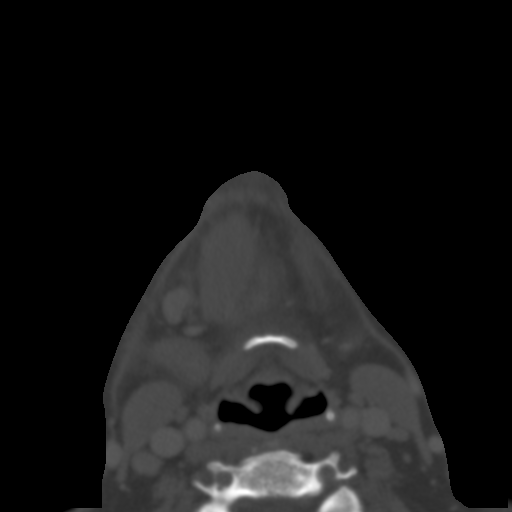
[im 20/83  bone]
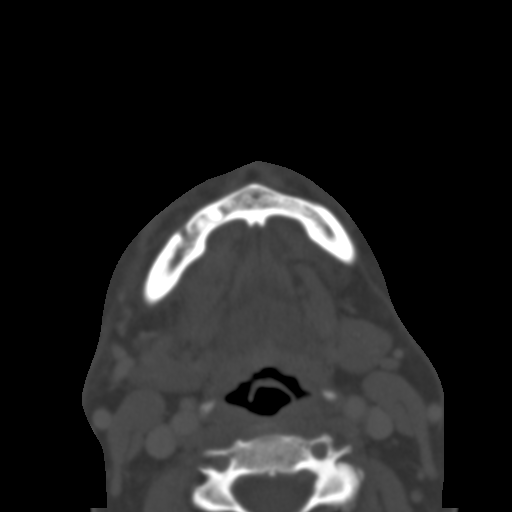
[im 26/83  bone]
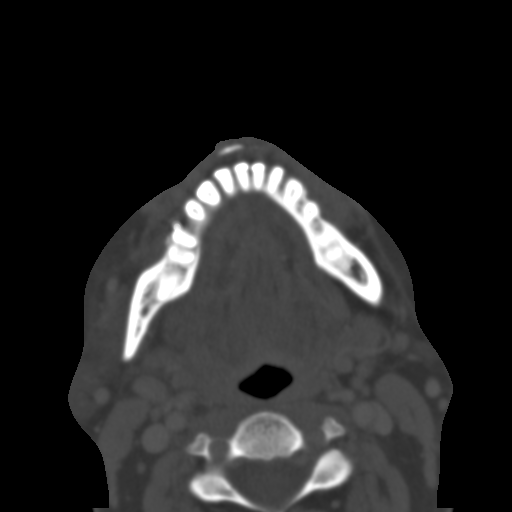
[im 34/83  brain]
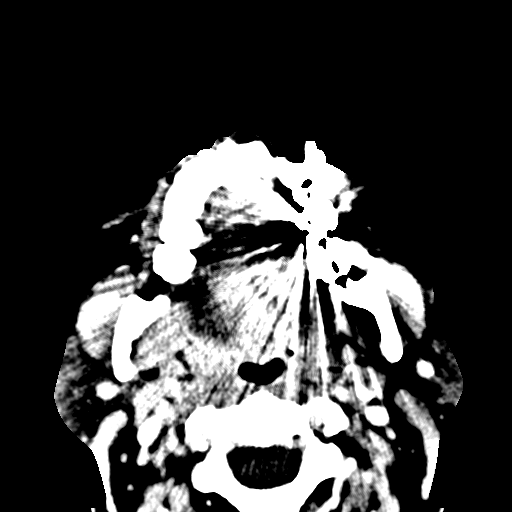
[im 34/83  bone]
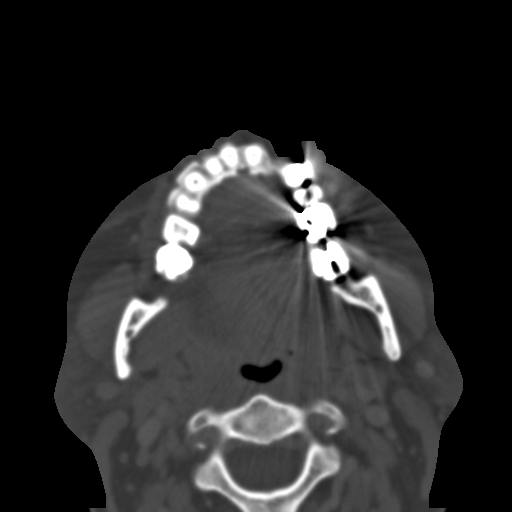
[im 43/83  bone]
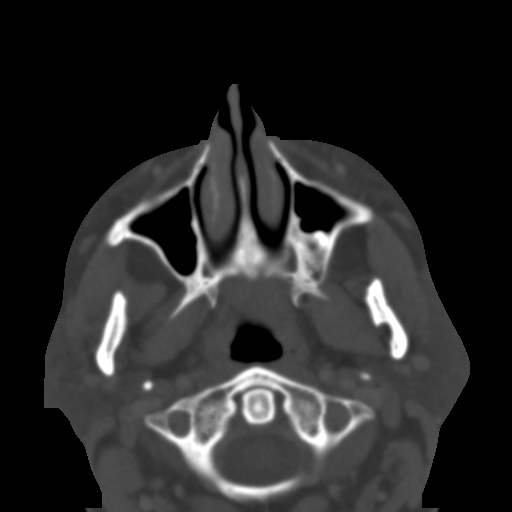
[im 49/83  bone]
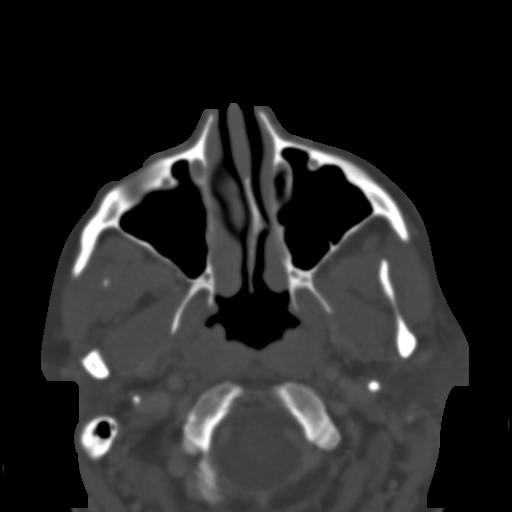
[im 57/83  bone]
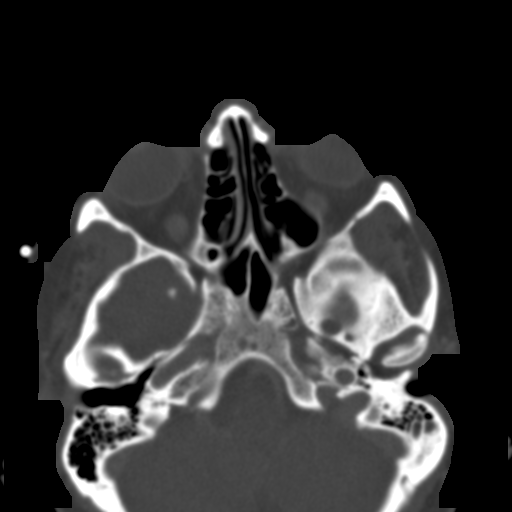
[im 63/83  brain]
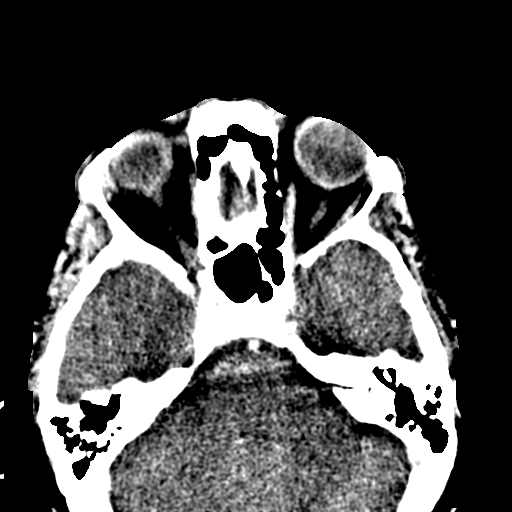
[im 63/83  bone]
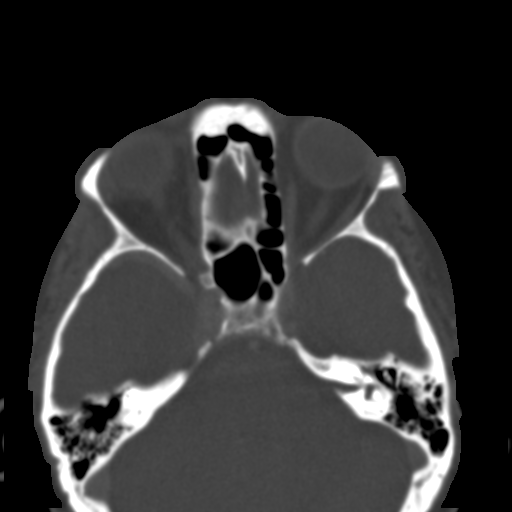
[im 71/83  bone]
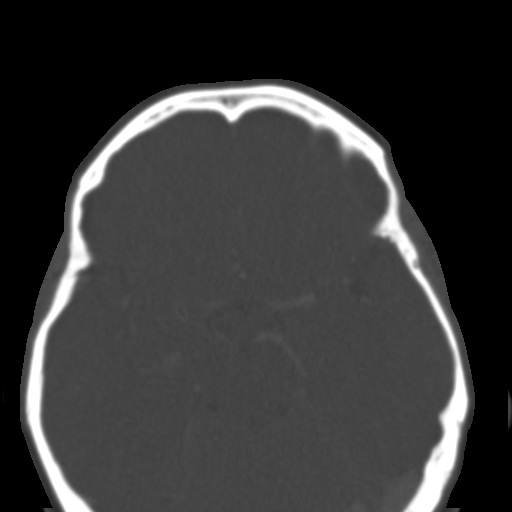
[im 77/83  bone]
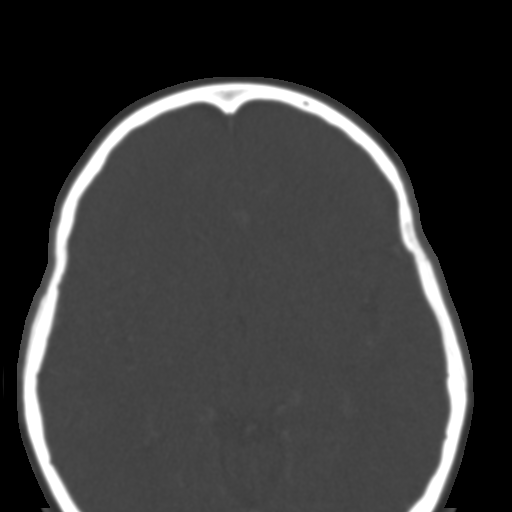

[Series 4: max st coro · coronal · 0.32mm/px · 3 of 68 slices shown]
[im 23/68  bone]
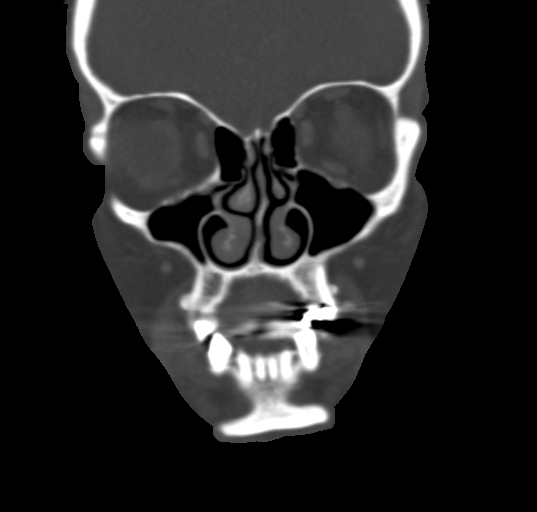
[im 30/68  bone]
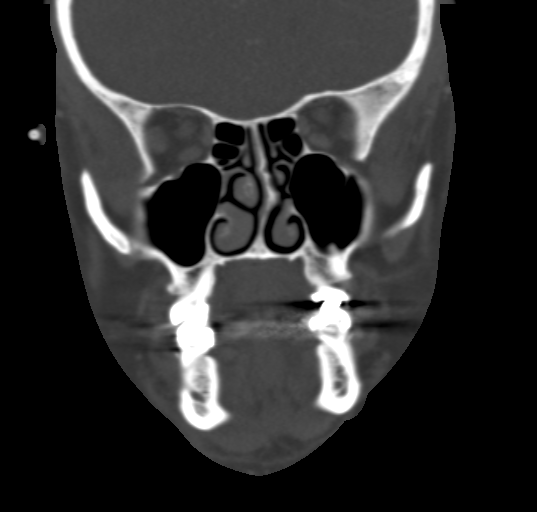
[im 38/68  bone]
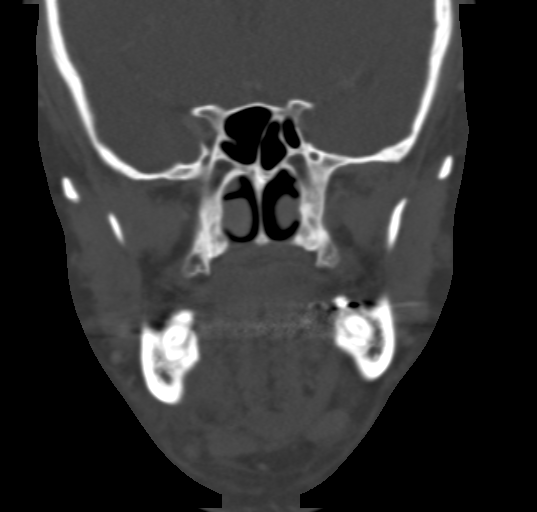

[Series 7: max bone sag · sagittal · 0.32mm/px · 2 of 102 slices shown]
[im 34/102  bone]
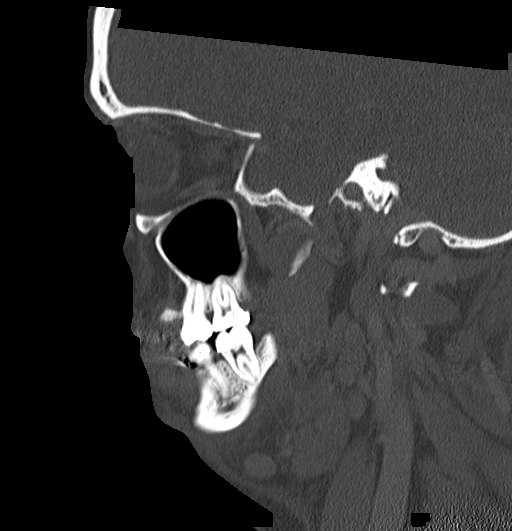
[im 68/102  bone]
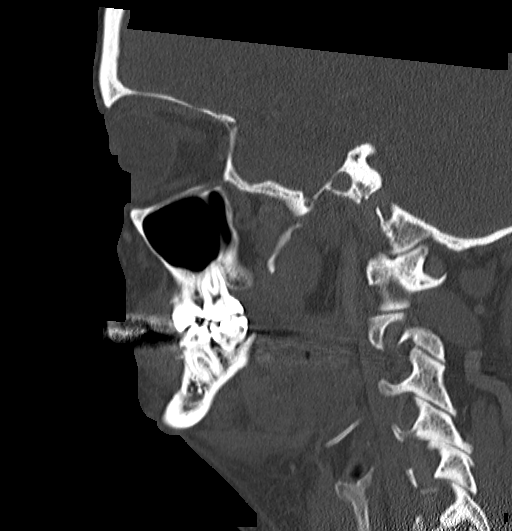

[16 of 47 positions shown; findings below may reference images not displayed]

FINDINGS: There is no evidence of fracture or dislocation. The maxilla and
mandible appear intact. The nasal bone is unremarkable in
appearance. A few small dental caries are suggested along the
anterior mandibular teeth, though not well assessed on CT. Scattered
postoperative changes are seen about the dentition. No periapical
abscesses are seen. No overlying soft tissue swelling is noted.

The orbits are intact bilaterally. The visualized paranasal sinuses
and mastoid air cells are well-aerated.

No significant soft tissue abnormalities are seen. The
parapharyngeal fat planes are preserved. The nasopharynx, oropharynx
and hypopharynx are unremarkable in appearance. The visualized
portions of the valleculae and piriform sinuses are grossly
unremarkable.

The parotid and submandibular glands are within normal limits. No
cervical lymphadenopathy is seen.
IMPRESSION: 1. No acute abnormality seen to explain the patient's symptoms.
2. Few small dental caries suggested along the anterior mandibular
teeth, though not well assessed on CT. Dentition otherwise
unremarkable in appearance. No periapical abscesses seen. No
overlying soft tissue swelling noted.

## 2015-03-11 MED ORDER — IOHEXOL 300 MG/ML  SOLN
75.0000 mL | Freq: Once | INTRAMUSCULAR | Status: AC | PRN
  Administered 2015-03-11: 75 mL via INTRAVENOUS

## 2015-03-11 MED ORDER — ONDANSETRON HCL 4 MG/2ML IJ SOLN
4.0000 mg | INTRAMUSCULAR | Status: DC | PRN
  Administered 2015-03-11: 4 mg via INTRAVENOUS
  Filled 2015-03-11: qty 2

## 2015-03-11 MED ORDER — ONDANSETRON HCL 4 MG PO TABS: 4.0000 mg | ORAL_TABLET | Freq: Three times a day (TID) | ORAL | Status: DC | PRN

## 2015-03-11 MED ORDER — CLINDAMYCIN PHOSPHATE 900 MG/50ML IV SOLN
900.0000 mg | Freq: Once | INTRAVENOUS | Status: AC
  Administered 2015-03-11: 900 mg via INTRAVENOUS
  Filled 2015-03-11: qty 50

## 2015-03-11 MED ORDER — SODIUM CHLORIDE 0.9 % IV BOLUS (SEPSIS)
500.0000 mL | Freq: Once | INTRAVENOUS | Status: AC
  Administered 2015-03-11: 500 mL via INTRAVENOUS

## 2015-03-11 MED ORDER — SODIUM CHLORIDE 0.9 % IV SOLN
INTRAVENOUS | Status: DC
  Administered 2015-03-11: 22:00:00 via INTRAVENOUS

## 2015-03-11 MED ORDER — CLINDAMYCIN HCL 150 MG PO CAPS: ORAL_CAPSULE | ORAL | Status: DC

## 2015-03-11 NOTE — ED Notes (Signed)
Patient c/o right lower dental abscess; patient states she has been vomiting since Wednesday night.

## 2015-03-11 NOTE — ED Provider Notes (Signed)
CSN: [id number]     Arrival date & time 03/11/15  1938 History   First MD Initiated Contact with Patient 03/11/15 1946     Chief Complaint  Patient presents with  . Emesis  . Dental Pain      HPI  Pt was seen at [place].  Per pt, c/o gradual onset and worsening of persistent right lower tooth "pain" for the past 3 days. Pt was evaluated by her dentist yesterday, rx clindamycin. Pt states she has had intermittent episodes of N/V throughout the day today. Pt denies vomiting after taking her antibiotic, stating she "just throws up at any time." Pt states the "pain is now going into my neck."  Denies fevers, no intra-oral edema, no rash, no facial swelling, no dysphagia.  The condition is aggravated by nothing. The condition is relieved by nothing. The symptoms have been associated with no other complaints.     Past Medical History  Diagnosis Date  . Fibromyalgia   . Bursitis     hips bilat  . Arthritis     back   . DDD (degenerative disc disease)   . Overactive bladder   . Depression   . PTSD (post-traumatic stress disorder)   . Anxiety   . Obsessive-compulsive disorder   . Polycythemia vera(238.4) 12/07/2011   Past Surgical History  Procedure Laterality Date  . Abdominal hysterectomy    . Lumbar fusion    . Esophagogastroduodenoscopy N/A 09/10/2014    Procedure: ESOPHAGOGASTRODUODENOSCOPY (EGD);  Surgeon: Rogene Houston, MD;  Location: [facility];  Service: Endoscopy;  Laterality: N/A;  130 - moved to 3:15 - Ann to notify   Family History  Problem Relation Age of Onset  . Cancer - Colon Mother     age 67  . Anxiety disorder Mother   . Bipolar disorder Neg Hx   . Dementia Neg Hx   . Drug abuse Neg Hx   . Paranoid behavior Neg Hx   . Schizophrenia Neg Hx   . Seizures Neg Hx   . Sexual abuse Neg Hx   . Physical abuse Neg Hx    History  Substance Use Topics  . Smoking status: Current Every Day Smoker -- 1.00 packs/day for 15 years    Types: Cigarettes  . Smokeless  tobacco: Never Used     Comment: 1 pack a day x 20 yrs.  . Alcohol Use: No    Review of Systems ROS: Statement: All systems negative except as marked or noted in the HPI; Constitutional: Negative for fever and chills. ; ; Eyes: Negative for eye pain and discharge. ; ; ENMT: Positive for dental caries, dental hygiene poor and toothache. Negative for ear pain, bleeding gums, dental injury, facial deformity, facial swelling, hoarseness, nasal congestion, sinus pressure, sore throat, throat swelling and tongue swollen. ; ; Cardiovascular: Negative for chest pain, palpitations, diaphoresis, dyspnea and peripheral edema. ; ; Respiratory: Negative for cough, wheezing and stridor. ; ; Gastrointestinal: +N/V. Negative for diarrhea and abdominal pain. ; ; Genitourinary: Negative for dysuria, flank pain and hematuria. ; ; Musculoskeletal: Negative for back pain and neck pain. ; ; Skin: Negative for rash and skin lesion. ; ; Neuro: Negative for headache, lightheadedness and neck stiffness. ;     Allergies  Cyclobenzaprine; Diclofenac sodium; Gabapentin; Penicillins; Robaxin; and Voltaren  Home Medications   Prior to Admission medications   Medication Sig Start Date End Date Taking? Authorizing Provider  clindamycin (CLEOCIN) 300 MG capsule Take 300 mg by mouth  2 (two) times daily. [place] ON 03/09/2015 03/09/15  Yes Historical Provider, MD  fluticasone (FLONASE) 50 MCG/ACT nasal spray Place 1 spray into both nostrils daily as needed. 02/07/15  Yes Historical Provider, MD  Hydrocodone-Acetaminophen 5-300 MG TABS Take 1 tablet by mouth every 4 (four) hours as needed. FOR PAIN 03/09/15  Yes Historical Provider, MD  ketorolac (ACULAR) 0.5 % ophthalmic solution Place 1 drop into both eyes daily as needed. 02/09/15  Yes Historical Provider, MD  levofloxacin (LEVAQUIN) 500 MG tablet Take 500 mg by mouth daily. 01/12/15  Yes Historical Provider, MD  Multiple Vitamin (MULTIVITAMIN WITH MINERALS) TABS tablet  Take 1 tablet by mouth daily.   Yes Historical Provider, MD  pantoprazole (PROTONIX) 40 MG tablet  02/15/15  Yes Historical Provider, MD  sertraline (ZOLOFT) 100 MG tablet Take 1.5 tablets (150 mg total) by mouth daily. 12/16/14  Yes Cloria Spring, MD  traZODone (DESYREL) 50 MG tablet Take 1 tablet (50 mg total) by mouth at bedtime. For sleep. 12/16/14 11/28/15 Yes Cloria Spring, MD  aspirin 81 MG tablet Take 1 tablet (81 mg total) by mouth daily. For Polycythemia Vera. Patient not taking: Reported on 03/11/2015 03/15/12   Greig Castilla, FNP   BP 142/84 mmHg  Pulse 80  Temp(Src) 99 F (37.2 C) (Oral)  Resp 18  Ht 5\' 7"  (1.702 m)  Wt 154 lb (69.854 kg)  BMI 24.11 kg/m2  SpO2 97% Physical Exam  2000: Physical examination: Vital signs and O2 SAT: Reviewed; Constitutional: Well developed, Well nourished, Well hydrated, In no acute distress; Head and Face: Normocephalic, Atraumatic; Eyes: EOMI, PERRL, No scleral icterus; ENMT: Mouth and pharynx normal, Poor dentition, Widespread dental decay, Left TM normal, Right TM normal, Mucous membranes moist, +lower right 1st molar with dental decay.  No gingival erythema, edema, fluctuance, or drainage.  No intra-oral edema. Large neck. No submandibular or sublingual edema. No hoarse voice, no drooling, no stridor. No trismus. ; Neck: Supple, Full range of motion, No lymphadenopathy; Cardiovascular: Regular rate and rhythm, No murmur, rub, or gallop; Respiratory: Breath sounds clear & equal bilaterally, No rales, rhonchi, wheezes, Normal respiratory effort/excursion; Chest: Nontender, Movement normal; Extremities: Pulses normal, No tenderness, No edema; Neuro: AA&Ox3, Major CN grossly intact.  No gross focal motor or sensory deficits in extremities.; Skin: Color normal, No rash, No petechiae, Warm, Dry. Psych:  Anxious.     ED Course  Procedures     EKG Interpretation None      MDM  MDM Reviewed: previous chart, nursing note and vitals Reviewed  previous: labs Interpretation: labs and CT scan      Results for orders placed or performed during the hospital encounter of 38/75/64  Basic metabolic panel  Result Value Ref Range   Sodium 138 135 - 145 mmol/L   Potassium 3.5 3.5 - 5.1 mmol/L   Chloride 104 96 - 112 mmol/L   CO2 27 19 - 32 mmol/L   Glucose, Bld 115 (H) 70 - 99 mg/dL   BUN 5 (L) 6 - 23 mg/dL   Creatinine, Ser 0.57 0.50 - 1.10 mg/dL   Calcium 8.9 8.4 - 10.5 mg/dL   GFR calc non Af Amer >90 >90 mL/min   GFR calc Af Amer >90 >90 mL/min   Anion gap 7 5 - 15  CBC with Differential  Result Value Ref Range   WBC 11.4 (H) 4.0 - 10.5 K/uL   RBC 4.18 3.87 - 5.11 MIL/uL   Hemoglobin 12.1  12.0 - 15.0 g/dL   HCT 36.6 36.0 - 46.0 %   MCV 87.6 78.0 - 100.0 fL   MCH 28.9 26.0 - 34.0 pg   MCHC 33.1 30.0 - 36.0 g/dL   RDW 14.8 11.5 - 15.5 %   Platelets 295 150 - 400 K/uL   Neutrophils Relative % 83 (H) 43 - 77 %   Neutro Abs 9.5 (H) 1.7 - 7.7 K/uL   Lymphocytes Relative 11 (L) 12 - 46 %   Lymphs Abs 1.3 0.7 - 4.0 K/uL   Monocytes Relative 6 3 - 12 %   Monocytes Absolute 0.7 0.1 - 1.0 K/uL   Eosinophils Relative 0 0 - 5 %   Eosinophils Absolute 0.0 0.0 - 0.7 K/uL   Basophils Relative 0 0 - 1 %   Basophils Absolute 0.0 0.0 - 0.1 K/uL  Lactic acid, plasma  Result Value Ref Range   Lactic Acid, Venous 0.8 0.5 - 2.0 mmol/L   Ct Maxillofacial W/cm 03/11/2015   CLINICAL DATA:  Acute onset of right jaw pain and neck pain. Initial encounter.  EXAM: CT MAXILLOFACIAL WITH CONTRAST  TECHNIQUE: Multidetector CT imaging of the maxillofacial structures was performed with intravenous contrast. Multiplanar CT image reconstructions were also generated. A small metallic BB was placed on the right temple in order to reliably differentiate right from left.  CONTRAST:  48mL OMNIPAQUE IOHEXOL 300 MG/ML  SOLN  COMPARISON:  None.  FINDINGS: There is no evidence of fracture or dislocation. The maxilla and mandible appear intact. The nasal bone is  unremarkable in appearance. A few small dental caries are suggested along the anterior mandibular teeth, though not well assessed on CT. Scattered postoperative changes are seen about the dentition. No periapical abscesses are seen. No overlying soft tissue swelling is noted.  The orbits are intact bilaterally. The visualized paranasal sinuses and mastoid air cells are well-aerated.  No significant soft tissue abnormalities are seen. The parapharyngeal fat planes are preserved. The nasopharynx, oropharynx and hypopharynx are unremarkable in appearance. The visualized portions of the valleculae and piriform sinuses are grossly unremarkable.  The parotid and submandibular glands are within normal limits. No cervical lymphadenopathy is seen.  IMPRESSION: 1. No acute abnormality seen to explain the patient's symptoms. 2. Few small dental caries suggested along the anterior mandibular teeth, though not well assessed on CT. Dentition otherwise unremarkable in appearance. No periapical abscesses seen. No overlying soft tissue swelling noted.   Electronically Signed   By: Garald Balding M.D.   On: 03/11/2015 21:33    2300:  No abscess or cellulitis on CT scan. Pt has tol PO well without N/V.  Pt states she is ready to go home now. Will increase clindamycin dose and frequency.  Dx and testing d/w pt and family.  Questions answered.  Verb understanding, agreeable to d/c home with outpt f/u.    Francine Graven, DO 03/14/15 1454

## 2015-03-11 NOTE — Discharge Instructions (Signed)
°Emergency Department Resource Guide °1) Find a Doctor and Pay Out of Pocket °Although you won't have to find out who is covered by your insurance plan, it is a good idea to ask around and get recommendations. You will then need to call the office and see if the doctor you have chosen will accept you as a new patient and what types of options they offer for patients who are self-pay. Some doctors offer discounts or will set up payment plans for their patients who do not have insurance, but you will need to ask so you aren't surprised when you get to your appointment. ° °2) Contact Your Local Health Department °Not all health departments have doctors that can see patients for sick visits, but many do, so it is worth a call to see if yours does. If you don't know where your local health department is, you can check in your phone book. The CDC also has a tool to help you locate your state's health department, and many state websites also have listings of all of their local health departments. ° °3) Find a Walk-in Clinic °If your illness is not likely to be very severe or complicated, you may want to try a walk in clinic. These are popping up all over the country in pharmacies, drugstores, and shopping centers. They're usually staffed by nurse practitioners or physician assistants that have been trained to treat common illnesses and complaints. They're usually fairly quick and inexpensive. However, if you have serious medical issues or chronic medical problems, these are probably not your best option. ° °No Primary Care Doctor: °- Call [facility] at  832-8000 - they can help you locate a primary care doctor that  accepts your insurance, provides certain services, etc. °- Physician Referral Service- 1-800-533-3463 ° °Chronic Pain Problems: °Organization         Address  Phone   Notes  °[facility]  ([phone]) [phone] Patients need to be referred by their primary care doctor.  ° °Medication  Assistance: °Organization         Address  Phone   Notes  °[place] [facility] [address] °[place], [place] [address] (336) 641-8030 --Must be a resident of [place] °-- Must have NO insurance coverage whatsoever (no Medicaid/ Medicare, etc.) °-- The pt. MUST have a primary care doctor that directs their care regularly and follows them in the community °  °[facility]  (866) 331-1348   °[employer]  (888) 892-1162   ° °Agencies that provide inexpensive medical care: °Organization         Address  Phone   Notes  °[facility]  (336) 832-8035   °[facility]    (336) 832-7272   °[facility] [address] °[place], [place] [address] (336) 832-4777   °[facility] [address], °[place] (336) 271-4999   °[facility]    (336) 373-0678   °[facility]    (336) 272-1050   °[facility] ° [address], [place] Phone:  (336) 832-4444, Fax:  (336) 832-4440 Hours of Operation:  9 am - 6 pm, M-F.  Also accepts Medicaid/Medicare and self-pay.  °[facility] ° [address], [place] Phone: (336) [phone], Fax: (336) [phone]. Hours of Operation:  8:30 am - 5:30 pm, M-F.  Also accepts Medicaid and self-pay.  °[facility] [place] [address], [place] Phone: (336) 878-6027   °[facility] [address], [place], [place] (336)723-1848, Ext. 123 Mondays & Thursdays: 7-9 AM.  First 15 patients are seen on a first come, first serve basis. °  ° °Medicaid-accepting [place] Providers: ° °Organization         Address  Phone   Notes  °[facility] [address], [place] (336) 641-2100 Also accepts self-pay patients.  °[facility] [address], [place] ° (336) 856-9996   °[facility] [address], [place]  (336) 288-8857   °[facility] [address] [place] [address], [place] (336) 299-7000   °Veita Bland [address], [place]  ° (336) 373-1557 Only accepts [place] Access Medicaid patients after they have their name applied to their card.  ° °Self-Pay (no insurance) in [place]: ° °Organization         Address  Phone   Notes  °Sickle Cell Patients, [facility] [address], [place] (336) 832-1970   °[facility] [address], [place] (336) 832-4400   °[facility] ° [address], [place] (336) 992-4800   °[facility]/Dr. Osei-Bonsu ° [address] [place] [address], [place] or [address], [place] (336) 841-8500 Phone number for both [place] and [place] locations is the same.  °[facility] [address], [place] (336) 299-0000   °[facility] [address] [place] [address], [place] or [address] (336) 852-7530 °(336) 878-2260   °[facility] [address], [place] (336) 350-1642, phone; (336) 294-5005, fax Sees patients 1st and 3rd Saturday of every month.  Must not qualify for public or private insurance (i.e. Medicaid, Medicare, [place] Health Choice, Veterans' Benefits) • Household income should be no more than 200% of the poverty level •The clinic cannot treat you if you are pregnant or think you are pregnant • Sexually transmitted diseases are not treated at the clinic.  ° ° °Dental Care: °Organization         Address  Phone  Notes  °[place] [facility] [address], [place] (336) 641-6152 Accepts children up to age 21 who are enrolled in Medicaid or [place] Health Choice; pregnant women with a Medicaid card; and children who have applied for Medicaid or [place] Health Choice, but were declined, whose parents can pay a reduced fee at time of service.  °[place]  [facility] [place]  [address], [place] (336) 641-7733 Accepts children up to age 21 who are enrolled in Medicaid or [place] Health Choice; pregnant women with a Medicaid card; and children who have applied for Medicaid or [place] Health Choice, but were declined, whose parents can pay a reduced fee at time of service.  °[facility] ° [address], [place] (336) 641-4533 Patients are seen by appointment only. Walk-ins are not accepted. [facility] will see patients 18 years of age and older. °Monday - Tuesday (8am-5pm) °Most Wednesdays (8:30-5pm) °$30 per visit, cash only  °[facility] ° [address], [place] (336) 641-4533 Patients are seen by appointment only. Walk-ins are not accepted. [facility] will see patients 18 years of age and older. °One   Wednesday Evening (Monthly: Volunteer Based).  $30 per visit, cash only  °[facility]  (919) 537-3737 for adults; Children under age 4, call Graduate Pediatric Dentistry at ([phone]) [phone]. Children aged 4-14, please call ([phone]) [phone] to request a pediatric application. ° Dental services are provided in all areas of dental care including fillings, crowns and bridges, complete and partial dentures, implants, gum treatment, root canals, and extractions. Preventive care is also provided. Treatment is provided to both adults and children. °Patients are selected via a lottery and there is often a waiting list. °  °[facility] [address], °[place] ° (336) 763-8833 [email or web address] °  °[facility] [address], [place], [place] (336)723-1848, Ext. 123 Second and Fourth Thursday of each month, opens at 6:30 AM; Clinic ends at 9 AM.  Patients are seen on a first-come first-served basis, and a limited number are seen during each clinic.  ° °[facility] ° [address], [place], [place] (336) 723-7904    Eligibility Requirements °You must have lived in [place], or [place] counties for at least the last three months. °  You cannot be eligible for state or federal sponsored healthcare insurance, including Veterans Administration, Medicaid, or Medicare. °  You generally cannot be eligible for healthcare insurance through your employer.  °  How to apply: °Eligibility screenings are held every Tuesday and Wednesday afternoon from 1:00 pm until 4:00 pm. You do not need an appointment for the interview!  °[facility] [address], [place], [place] 336-631-2330   °[facility]  336-342-8273   °[place]  336-703-3100   ° [facility]  336-570-6415   ° °Behavioral Health Resources in the Community: °Intensive Outpatient Programs °Organization         Address  Phone  Notes  °[facility] [address], [place], [place] 336-878-6098   °[facility] [address], [place], [place] 336-832-9800   °[facility] [address], [place], [place] ° 336-882-2125   °[facility] [address],  °[place], [place] 1-800-853-5163 or 336-641-4981   °Substance Abuse Resources °Organization         Address  Phone  Notes  °[facility]  336-882-2125   °[facility]  336-784-9470   °[facility]  336-285-9073   °[facility]  336-845-3988   °Residential & Outpatient Substance Abuse Program  1-800-659-3381   °Psychological Services °Organization         Address  Phone  Notes  °[facility]  336- 832-9600   °[facility]  336- 378-7881   °[facility] [address], [place] 1-800-853-5163 or 336-641-4981   ° °Mobile Crisis Teams °Organization         Address  Phone  Notes  °[facility], Mobile Crisis Care Unit  1-877-626-1772   °Assertive °[facility] ° [address]  [place], [place] 336-834-9664   °Sharon DeEsch [address] ° [place] 336-554-5454   ° °Self-Help/Support Groups °Organization         Address  Phone             Notes  °[facility]  - variety of support groups  336- 373-1402 Call for more information  °Narcotics Anonymous (NA), [facility] [address], °[place]  2 meetings at this location  ° °  Residential Treatment Programs Organization         Address  Phone  Notes  [facility] [address],    [place]  1-6801049881   [facility]  [address], [place] [id number], [place], [place]   [facility] [place], [place] 514-168-7007 Admissions: 8am-3pm M-F  [facility] [place] [address],    [place], [place] [phone]   [facility] [address] [place], [place], [place]   [facility] [address],  [place], [place]   [facility] Intensive Outpatient [address], [place], [place]   [facility] [address]  [place], [place] 1-250-112-7179 or 515-511-0824   [facility] [address], [place], [place] Accepts Medicaid  [facility] [address] [place] [address],  [place] 1-787-359-9133 Substance Abuse/Addiction Treatment   [facility] Organization         Address  Phone  Notes  [facility]  865-489-4157   Domenic Schwab, PhD [address] [place], [place]   630-299-1184 or 364-056-7290   [facility] [place], [place] 754-210-3632   [facility] [address] [place], [place] (973)323-6190 Insurance/Medicaid/sponsorship through [facility] and Families [address] [place]                                    [place], [place] (325)108-1110 [place] [address], [place] (615)[phone]    Dr. Adele Schilder  [phone]   [facility] Dept. 1) [address], [place] 2) [place] 3)  [address], [place] [phone]  [phone]   [place] [phone] or [phone] (After Hours)      Take the new antibiotic prescription as directed.  Call your regular Dentist on Monday to schedule a follow up appointment within the next 3 days.  Return to the Emergency Department immediately sooner if worsening.

## 2015-03-11 NOTE — ED Notes (Signed)
Patient passed fluid challenge without any difficulties. 

## 2015-03-16 ENCOUNTER — Ambulatory Visit (INDEPENDENT_AMBULATORY_CARE_PROVIDER_SITE_OTHER): Payer: BC Managed Care – PPO | Admitting: Psychiatry

## 2015-03-16 ENCOUNTER — Encounter (HOSPITAL_COMMUNITY): Payer: Self-pay | Admitting: Psychiatry

## 2015-03-16 VITALS — Wt 151.0 lb

## 2015-03-16 DIAGNOSIS — F429 Obsessive-compulsive disorder, unspecified: Secondary | ICD-10-CM

## 2015-03-16 DIAGNOSIS — F332 Major depressive disorder, recurrent severe without psychotic features: Secondary | ICD-10-CM

## 2015-03-16 DIAGNOSIS — F5105 Insomnia due to other mental disorder: Secondary | ICD-10-CM

## 2015-03-16 DIAGNOSIS — F1994 Other psychoactive substance use, unspecified with psychoactive substance-induced mood disorder: Secondary | ICD-10-CM

## 2015-03-16 DIAGNOSIS — F418 Other specified anxiety disorders: Secondary | ICD-10-CM

## 2015-03-16 DIAGNOSIS — F321 Major depressive disorder, single episode, moderate: Secondary | ICD-10-CM

## 2015-03-16 MED ORDER — TRAZODONE HCL 50 MG PO TABS: 50.0000 mg | ORAL_TABLET | Freq: Every day | ORAL | Status: DC

## 2015-03-16 MED ORDER — SERTRALINE HCL 100 MG PO TABS: 150.0000 mg | ORAL_TABLET | Freq: Every day | ORAL | Status: DC

## 2015-03-16 NOTE — Progress Notes (Signed)
Patient ID: Cheryl Cross, female   DOB: March 05, 1966, 49 y.o.   MRN: [id number] Patient ID: Cheryl Cross, female   DOB: December 05, 1965, 49 y.o.   MRN: [id number] Patient ID: Cheryl Cross, female   DOB: 1966/10/03, 49 y.o.   MRN: [id number] Patient ID: Cheryl Cross, female   DOB: 09/02/1966, 49 y.o.   MRN: [id number] Patient ID: Cheryl Cross, female   DOB: Jun 19, 1966, 49 y.o.   MRN: [id number] Patient ID: Cheryl Cross, female   DOB: 10/01/1966, 49 y.o.   MRN: [id number] Patient ID: Cheryl Cross, female   DOB: 1966/09/13, 49 y.o.   MRN: [id number] [facility] MD Progress Note [id number] ALIEAH BRINTON  MRN:  [id number] DOB:: 1966/05/07 Age: 49 y.o. Date: 03/16/2015  Chief Complaint: Chief Complaint  Patient presents with  . Depression  . Anxiety  . Follow-up   History of present illness This patient is a 49 year old separated white female who lives alone in [place]. 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 She had back pain and fibromyalgia. She got hooked on taking too many Xanax and was sleeping all the time and unable to function. [facility] and got off the Xanax and since then has been on trazodone and Zoloft. 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 She sees Dr. Jefm Miles here which she finds very helpful. She denies being depressed and stays on a good regimen of eating and sleeping. Her mood is generally been good and she's not significantly anxious. She's not abusing any substances and she denies suicidal ideation  The patient returns after 3 months. She has been doing well. However, she recently had an absessed tooth and had to go to the ER. She is feeling better now. Her energy is improving. She's not had any significant depression or suicidality.  Diagnosis:   Axis I: Major  Depression, Recurrent severe, Substance Abuse and Substance Induced Mood Disorder Axis II: Deferred Axis III:  Past Medical History  Diagnosis Date  . Fibromyalgia   . Bursitis     hips bilat  . Arthritis     back   . DDD (degenerative disc disease)   . Overactive bladder   . Depression   . PTSD (post-traumatic stress disorder)   . Anxiety   . Obsessive-compulsive disorder   . Polycythemia vera(238.4) 12/07/2011   Axis IV: other psychosocial or environmental problems Axis V: 51-60 moderate symptoms  ADL's:  Intact  Sleep: Good, thanks to Trazodone  Appetite:  Fair  Suicidal Ideation:  Pt denies any thoughts, plans, intent of suicide_0 Homicidal Ideation:  Pt denies any thoughts, plans, intent of homicide  AEB (as evidenced by):per pt report  Psychiatric Specialty Exam: ROS Patient ID: Cheryl Cross, female   DOB: 1966/02/11, 48 y.o.   MRN: [id number] Patient ID: Cheryl Cross, female   DOB: Oct 26, 1966, 49 y.o.   MRN: [id number] Patient ID: Cheryl Cross, female   DOB: [birth year], 48 y.o.   MRN: [id number] [facility] MD Progress Note [id number] Cheryl Cross  MRN:  [id number] DOB:: [birth year] Age: 49 y.o. Date: 03/16/2015  Chief Complaint: Chief Complaint  Patient presents with  . Depression  . Anxiety  . Follow-up   t Diagnosis:   Axis I: Major Depression, Recurrent severe, Substance Abuse and Substance Induced Mood Disorder Axis II: Deferred Axis III:  Past Medical History  Diagnosis Date  . Fibromyalgia   . Bursitis     hips bilat  . Arthritis     back   . DDD (degenerative disc disease)   . Overactive bladder   . Depression   . PTSD (post-traumatic stress disorder)   . Anxiety   . Obsessive-compulsive disorder   . Polycythemia vera(238.4) 12/07/2011   Axis IV: other psychosocial or environmental problems Axis V: 51-60 moderate symptoms  ADL's:  Intact  Sleep: Good, thanks to Trazodone  Appetite:  Fair  Suicidal Ideation:  Pt denies  any thoughts, plans, intent of suicide\ Homicidal Ideation:  Pt denies any thoughts, plans, intent of homicide  AEB (as evidenced by):per pt report  Psychiatric Specialty Exam: ROS  Weight 151 lb (68.493 kg).Body mass index is 23.64 kg/(m^2).  General Appearance: Casual  Eye Contact::  Good  Speech:  Clear and Coherent  Volume:  Normal  Mood: good   Affect:  Congruent  Thought Process:  Coherent, Linear and Logical  Orientation:  Full (Time, Place, and Person)  Thought Content:  WDL  Suicidal Thoughts:  No  Homicidal Thoughts:  No  Memory:  Immediate;   Good Recent;   Good Remote;   Good  Judgement:  Good  Insight:  Good  Psychomotor Activity:  TYpical  Concentration:  Good  Recall:  Good  Akathisia:  No  Handed:  Right  AIMS (if indicated):     Assets:  Communication Skills Desire for Improvement  Sleep:      Current Medications: Current Outpatient Prescriptions  Medication Sig Dispense Refill  . aspirin 81 MG tablet Take 1 tablet (81 mg total) by mouth daily. For Polycythemia Vera. (Patient not taking: Reported on 03/11/2015)    . clindamycin (CLEOCIN) 150 MG capsule 3 tabs PO TID x 10 days 90 capsule 0  . fluticasone (FLONASE) 50 MCG/ACT nasal spray Place 1 spray into both nostrils daily as needed.  1  . Hydrocodone-Acetaminophen 5-300 MG TABS Take 1 tablet by mouth every 4 (four) hours as needed. FOR PAIN  0  . ketorolac (ACULAR) 0.5 % ophthalmic solution Place 1 drop into both eyes daily as needed.    . levofloxacin (LEVAQUIN) 500 MG tablet Take 500 mg by mouth daily.    . Multiple Vitamin (MULTIVITAMIN WITH MINERALS) TABS tablet Take 1 tablet by mouth daily.    . ondansetron (ZOFRAN) 4 MG tablet Take 1 tablet (4 mg total) by mouth every 8 (eight) hours as needed for nausea or vomiting. 8 tablet 0  . pantoprazole (PROTONIX) 40 MG tablet   0  . sertraline (ZOLOFT) 100 MG tablet Take 1.5 tablets (150 mg total) by mouth daily. 45 tablet 3  . traZODone (DESYREL) 50 MG  tablet Take 1 tablet (50 mg total) by mouth at bedtime. For sleep. 30 tablet 3   No current facility-administered medications for this visit.   Lab Results:  Results for orders placed or performed during the hospital encounter of 03/11/15 (from the past 8736 hour(s))  Basic metabolic panel   Collection Time: 03/11/15  8:05 PM  Result Value Ref Range   Sodium 138 135 - 145 mmol/L   Potassium 3.5 3.5 - 5.1 mmol/L   Chloride 104 96 - 112 mmol/L   CO2 27 19 - 32 mmol/L   Glucose, Bld 115 (H) 70 - 99 mg/dL   BUN 5 (L) 6 - 23 mg/dL   Creatinine, Ser 0.57 0.50 - 1.10 mg/dL   Calcium 8.9   8.4 - 10.5 mg/dL   GFR calc non Af Amer >90 >90 mL/min   GFR calc Af Amer >90 >90 mL/min   Anion gap 7 5 - 15  CBC with Differential   Collection Time: 03/11/15  8:05 PM  Result Value Ref Range   WBC 11.4 (H) 4.0 - 10.5 K/uL   RBC 4.18 3.87 - 5.11 MIL/uL   Hemoglobin 12.1 12.0 - 15.0 g/dL   HCT 36.6 36.0 - 46.0 %   MCV 87.6 78.0 - 100.0 fL   MCH 28.9 26.0 - 34.0 pg   MCHC 33.1 30.0 - 36.0 g/dL   RDW 14.8 11.5 - 15.5 %   Platelets 295 150 - 400 K/uL   Neutrophils Relative % 83 (H) 43 - 77 %   Neutro Abs 9.5 (H) 1.7 - 7.7 K/uL   Lymphocytes Relative 11 (L) 12 - 46 %   Lymphs Abs 1.3 0.7 - 4.0 K/uL   Monocytes Relative 6 3 - 12 %   Monocytes Absolute 0.7 0.1 - 1.0 K/uL   Eosinophils Relative 0 0 - 5 %   Eosinophils Absolute 0.0 0.0 - 0.7 K/uL   Basophils Relative 0 0 - 1 %   Basophils Absolute 0.0 0.0 - 0.1 K/uL  Lactic acid, plasma   Collection Time: 03/11/15  9:29 PM  Result Value Ref Range   Lactic Acid, Venous 0.8 0.5 - 2.0 mmol/L  Results for orders placed or performed during the hospital encounter of 02/25/15 (from the past 8736 hour(s))  Hemoglobin and hematocrit, blood   Collection Time: 02/25/15 11:04 AM  Result Value Ref Range   Hemoglobin 13.0 12.0 - 15.0 g/dL   HCT 39.9 36.0 - 46.0 %  Results for orders placed or performed in visit on 02/08/15 (from the past 8736 hour(s))   Cytology - PAP   Collection Time: 02/08/15 12:00 AM  Result Value Ref Range   CYTOLOGY - PAP PAP RESULT   Results for orders placed or performed during the hospital encounter of 10/22/14 (from the past 8736 hour(s))  Hemoglobin and hematocrit, blood   Collection Time: 10/22/14 11:20 AM  Result Value Ref Range   Hemoglobin 13.1 12.0 - 15.0 g/dL   HCT 39.5 36.0 - 46.0 %  Results for orders placed or performed during the hospital encounter of 09/10/14 (from the past 8736 hour(s))  Clotest (H. pylori), biopsy   Collection Time: 09/10/14  3:00 PM  Result Value Ref Range   Helicobacter screen NEGATIVE (A) UREASE NEGATIVE  Results for orders placed or performed during the hospital encounter of 06/17/14 (from the past 8736 hour(s))  CBC with Differential   Collection Time: 06/17/14  1:05 PM  Result Value Ref Range   WBC 7.3 4.0 - 10.5 K/uL   RBC 4.55 3.87 - 5.11 MIL/uL   Hemoglobin 13.8 12.0 - 15.0 g/dL   HCT 41.2 36.0 - 46.0 %   MCV 90.5 78.0 - 100.0 fL   MCH 30.3 26.0 - 34.0 pg   MCHC 33.5 30.0 - 36.0 g/dL   RDW 13.5 11.5 - 15.5 %   Platelets 311 150 - 400 K/uL   Neutrophils Relative % 59 43 - 77 %   Neutro Abs 4.3 1.7 - 7.7 K/uL   Lymphocytes Relative 33 12 - 46 %   Lymphs Abs 2.4 0.7 - 4.0 K/uL   Monocytes Relative 5 3 - 12 %   Monocytes Absolute 0.4 0.1 - 1.0 K/uL   Eosinophils Relative 3 0 - 5 %     Eosinophils Absolute 0.2 0.0 - 0.7 K/uL   Basophils Relative 0 0 - 1 %   Basophils Absolute 0.0 0.0 - 0.1 K/uL  Results for orders placed or performed in visit on 05/31/14 (from the past 8736 hour(s))  CMP with Estimated GFR ([id number])   Collection Time: 05/31/14 10:00 AM  Result Value Ref Range   Sodium 137 135 - 145 mEq/L   Potassium 3.8 3.5 - 5.3 mEq/L   Chloride 104 96 - 112 mEq/L   CO2 27 19 - 32 mEq/L   Glucose, Bld 83 70 - 99 mg/dL   BUN 3 (L) 6 - 23 mg/dL   Creat 0.57 0.50 - 1.10 mg/dL   Total Bilirubin 0.4 0.2 - 1.2 mg/dL   Alkaline Phosphatase 44 39 - 117 U/L    AST 9 0 - 37 U/L   ALT <8 0 - 35 U/L   Total Protein 6.3 6.0 - 8.3 g/dL   Albumin 4.1 3.5 - 5.2 g/dL   Calcium 8.8 8.4 - 10.5 mg/dL   GFR, Est African American >89 mL/min   GFR, Est Non African American >89 mL/min  Ferritin   Collection Time: 05/31/14 10:00 AM  Result Value Ref Range   Ferritin 10 10 - 291 ng/mL  CBC with Diff   Collection Time: 05/31/14 10:00 AM  Result Value Ref Range   WBC 6.1 4.0 - 10.5 K/uL   RBC 4.43 3.87 - 5.11 MIL/uL   Hemoglobin 13.4 12.0 - 15.0 g/dL   HCT 40.1 36.0 - 46.0 %   MCV 90.5 78.0 - 100.0 fL   MCH 30.2 26.0 - 34.0 pg   MCHC 33.4 30.0 - 36.0 g/dL   RDW 14.4 11.5 - 15.5 %   Platelets 299 150 - 400 K/uL   Neutrophils Relative % 63 43 - 77 %   Neutro Abs 3.8 1.7 - 7.7 K/uL   Lymphocytes Relative 28 12 - 46 %   Lymphs Abs 1.7 0.7 - 4.0 K/uL   Monocytes Relative 7 3 - 12 %   Monocytes Absolute 0.4 0.1 - 1.0 K/uL   Eosinophils Relative 2 0 - 5 %   Eosinophils Absolute 0.1 0.0 - 0.7 K/uL   Basophils Relative 0 0 - 1 %   Basophils Absolute 0.0 0.0 - 0.1 K/uL   Smear Review Criteria for review not met    Physical Findings: AIMS:  , ,  ,  ,    CIWA:    COWS:     Treatment Plan Summary: Medication management  Plan: I will continue Zoloft and trazodone.  Recommend to call us back if she has any question concerns or if she feels worsening symptoms.  Followup in 3 months.  MEDICATIONS this encounter: Meds ordered this encounter  Medications  . DISCONTD: sertraline (ZOLOFT) 100 MG tablet    Sig: Take 1.5 tablets (150 mg total) by mouth daily.    Dispense:  45 tablet    Refill:  2  . DISCONTD: traZODone (DESYREL) 50 MG tablet    Sig: Take 1 tablet (50 mg total) by mouth at bedtime. For sleep.    Dispense:  30 tablet    Refill:  2  . traZODone (DESYREL) 50 MG tablet    Sig: Take 1 tablet (50 mg total) by mouth at bedtime. For sleep.    Dispense:  30 tablet    Refill:  3  . sertraline (ZOLOFT) 100 MG tablet    Sig: Take 1.5 tablets (150  mg total)  by mouth daily.    Dispense:  45 tablet    Refill:  3    Medical Decision Making Problem Points:  Established problem, stable/improving (1) and Review of last therapy session (1) Data Points:  Review or order clinical lab tests (1) Review of medication regiment & side effects (2)  ROSS, DEBORAH 03/16/2015, 5:07 PM      

## 2015-03-21 ENCOUNTER — Ambulatory Visit (HOSPITAL_COMMUNITY)
Admission: RE | Admit: 2015-03-21 | Discharge: 2015-03-21 | Disposition: A | Discharge status: Home / Self Care | Payer: BC Managed Care – PPO | Source: Ambulatory Visit | Attending: Family Medicine | Admitting: Family Medicine

## 2015-03-21 DIAGNOSIS — Z1231 Encounter for screening mammogram for malignant neoplasm of breast: Secondary | ICD-10-CM | POA: Diagnosis not present

## 2015-04-15 ENCOUNTER — Encounter: Payer: Self-pay | Admitting: Gastroenterology

## 2015-05-30 ENCOUNTER — Other Ambulatory Visit: Payer: Self-pay

## 2015-06-06 ENCOUNTER — Ambulatory Visit: Payer: Self-pay | Admitting: Oncology

## 2015-06-13 HISTORY — PX: TOOTH EXTRACTION: SUR596

## 2015-06-23 ENCOUNTER — Other Ambulatory Visit: Payer: Self-pay | Admitting: Oncology

## 2015-06-24 ENCOUNTER — Other Ambulatory Visit: Payer: Self-pay | Admitting: Oncology

## 2015-06-24 DIAGNOSIS — D45 Polycythemia vera: Secondary | ICD-10-CM

## 2015-06-27 ENCOUNTER — Other Ambulatory Visit: Payer: Self-pay

## 2015-06-27 ENCOUNTER — Other Ambulatory Visit (INDEPENDENT_AMBULATORY_CARE_PROVIDER_SITE_OTHER): Payer: BC Managed Care – PPO

## 2015-06-27 DIAGNOSIS — D45 Polycythemia vera: Secondary | ICD-10-CM

## 2015-06-28 LAB — COMPREHENSIVE METABOLIC PANEL
ALT: 9 IU/L (ref 0–32)
AST: 10 IU/L (ref 0–40)
Albumin/Globulin Ratio: 2.1 (ref 1.1–2.5)
Albumin: 4.2 g/dL (ref 3.5–5.5)
Alkaline Phosphatase: 56 IU/L (ref 39–117)
BUN/Creatinine Ratio: 8 — ABNORMAL LOW (ref 9–23)
BUN: 5 mg/dL — AB (ref 6–24)
CHLORIDE: 102 mmol/L (ref 97–108)
CO2: 22 mmol/L (ref 18–29)
Calcium: 9.1 mg/dL (ref 8.7–10.2)
Creatinine, Ser: 0.59 mg/dL (ref 0.57–1.00)
GFR calc Af Amer: 126 mL/min/{1.73_m2} (ref >59)
GFR calc non Af Amer: 109 mL/min/{1.73_m2} (ref >59)
GLUCOSE: 93 mg/dL (ref 65–99)
Globulin, Total: 2 g/dL (ref 1.5–4.5)
Potassium: 4 mmol/L (ref 3.5–5.2)
Sodium: 142 mmol/L (ref 134–144)
TOTAL PROTEIN: 6.2 g/dL (ref 6.0–8.5)

## 2015-06-28 LAB — CBC WITH DIFFERENTIAL/PLATELET
BASOS ABS: 0 10*3/uL (ref 0.0–0.2)
Basos: 1 %
EOS (ABSOLUTE): 0.2 10*3/uL (ref 0.0–0.4)
Eos: 3 %
Hematocrit: 39.1 % (ref 34.0–46.6)
Hemoglobin: 12.4 g/dL (ref 11.1–15.9)
IMMATURE GRANS (ABS): 0 10*3/uL (ref 0.0–0.1)
Immature Granulocytes: 0 %
LYMPHS: 31 %
Lymphocytes Absolute: 2 10*3/uL (ref 0.7–3.1)
MCH: 27.1 pg (ref 26.6–33.0)
MCHC: 31.7 g/dL (ref 31.5–35.7)
MCV: 86 fL (ref 79–97)
Monocytes Absolute: 0.5 10*3/uL (ref 0.1–0.9)
Monocytes: 8 %
NEUTROS PCT: 57 %
Neutrophils Absolute: 3.7 10*3/uL (ref 1.4–7.0)
PLATELETS: 326 10*3/uL (ref 150–379)
RBC: 4.57 x10E6/uL (ref 3.77–5.28)
RDW: 15.4 % (ref 12.3–15.4)
WBC: 6.4 10*3/uL (ref 3.4–10.8)

## 2015-06-28 LAB — FERRITIN: FERRITIN: 9 ng/mL — AB (ref 15–150)

## 2015-06-30 ENCOUNTER — Encounter (HOSPITAL_COMMUNITY): Payer: Self-pay | Admitting: Psychiatry

## 2015-06-30 ENCOUNTER — Ambulatory Visit (INDEPENDENT_AMBULATORY_CARE_PROVIDER_SITE_OTHER): Payer: BC Managed Care – PPO | Admitting: Psychiatry

## 2015-06-30 VITALS — BP 113/73 | HR 68 | Ht 67.0 in | Wt 154.2 lb

## 2015-06-30 DIAGNOSIS — F1994 Other psychoactive substance use, unspecified with psychoactive substance-induced mood disorder: Secondary | ICD-10-CM

## 2015-06-30 DIAGNOSIS — F5105 Insomnia due to other mental disorder: Secondary | ICD-10-CM

## 2015-06-30 DIAGNOSIS — F332 Major depressive disorder, recurrent severe without psychotic features: Secondary | ICD-10-CM | POA: Diagnosis not present

## 2015-06-30 DIAGNOSIS — F429 Obsessive-compulsive disorder, unspecified: Secondary | ICD-10-CM

## 2015-06-30 DIAGNOSIS — F321 Major depressive disorder, single episode, moderate: Secondary | ICD-10-CM

## 2015-06-30 DIAGNOSIS — F418 Other specified anxiety disorders: Secondary | ICD-10-CM

## 2015-06-30 MED ORDER — SERTRALINE HCL 100 MG PO TABS: 150.0000 mg | ORAL_TABLET | Freq: Every day | ORAL | Status: DC

## 2015-06-30 MED ORDER — TRAZODONE HCL 50 MG PO TABS: 50.0000 mg | ORAL_TABLET | Freq: Every day | ORAL | Status: DC

## 2015-06-30 NOTE — Progress Notes (Signed)
Patient ID: Cheryl Cross, female   DOB: 1966-10-01, 49 y.o.   MRN: [id number] Patient ID: Cheryl Cross, female   DOB: 12-13-1965, 49 y.o.   MRN: [id number] Patient ID: Cheryl Cross, female   DOB: 10-08-1966, 49 y.o.   MRN: [id number] Patient ID: Cheryl Cross, female   DOB: 03/16/1966, 49 y.o.   MRN: [id number] Patient ID: Cheryl Cross, female   DOB: 11/25/1965, 49 y.o.   MRN: [id number] Patient ID: Cheryl Cross, female   DOB: 1966/08/16, 49 y.o.   MRN: [id number] Patient ID: Cheryl Cross, female   DOB: 08-05-1966, 49 y.o.   MRN: [id number] Patient ID: Cheryl Cross, female   DOB: 04/11/1966, 49 y.o.   MRN: [id number] [facility] MD Progress Note [id number] Cheryl Cross  MRN:  [id number] DOB:: Jan 18, 1966 Age: 49 y.o. Date: 06/30/2015  Chief Complaint: Chief Complaint  Patient presents with  . Depression  . Anxiety  . Follow-up   History of present illness This patient is a 49 year old separated white female who lives alone in [place]. 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 She had back pain and fibromyalgia. She got hooked on taking too many Xanax and was sleeping all the time and unable to function. [facility] and got off the Xanax and since then has been on trazodone and Zoloft. 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 She sees Dr. Jefm Miles here which she finds very helpful. She denies being depressed and stays on a good regimen of eating and sleeping. Her mood is generally been good and she's not significantly anxious. She's not abusing any substances and she denies suicidal ideation  The patient returns after 4 months. She has been doing well. However, she recently had an absessed tooth pulled out again and she said a fair amount of pain. This is starting to resolve  however. Her mood is been good and she's been getting more involved in [employer] school. 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 Her mood is been stable and she denies depression symptoms anxiety or difficulty with sleep. She denies suicidal ideation.  Diagnosis:   Axis I: Major Depression, Recurrent severe, Substance Abuse and Substance Induced Mood Disorder Axis II: Deferred Axis III:  Past Medical History  Diagnosis Date  . Fibromyalgia   . Bursitis     hips bilat  . Arthritis     back   . DDD (degenerative disc disease)   . Overactive bladder   . Depression   . PTSD (post-traumatic stress disorder)   . Anxiety   . Obsessive-compulsive disorder   . Polycythemia vera(238.4) 12/07/2011   Axis IV: other psychosocial or environmental problems Axis V: 51-60 moderate symptoms  ADL's:  Intact  Sleep: Good, thanks to Trazodone  Appetite:  Fair  Suicidal Ideation:  Pt denies any thoughts, plans, intent of suicide\ Homicidal Ideation:  Pt denies any thoughts, plans, intent of homicide  AEB (as evidenced by):per pt report  Psychiatric Specialty Exam: ROS Patient ID: Cheryl Cross, female   DOB: [birth year], 49 y.o.   MRN: [id number] Patient ID: Cheryl Cross, female   DOB: 04/29/1966, 49 y.o.   MRN: [id number] Patient ID: Cheryl Cross, female   DOB: January 01, 1966, 49 y.o.   MRN: [id number] [facility] MD Progress Note [id number] Cheryl  Cheryl Cross  MRN:  [id number] DOB:: 1966-09-12 Age: 49 y.o. Date: 06/30/2015  Chief Complaint: Chief Complaint  Patient presents with  . Depression  . Anxiety  . Follow-up   t Diagnosis:   Axis I: Major Depression, Recurrent severe, Substance Abuse and Substance Induced Mood Disorder Axis II: Deferred Axis III:  Past Medical History  Diagnosis Date  . Fibromyalgia   . Bursitis     hips bilat  . Arthritis     back   . DDD (degenerative disc disease)   . Overactive bladder   . Depression   . PTSD  (post-traumatic stress disorder)   . Anxiety   . Obsessive-compulsive disorder   . Polycythemia vera(238.4) 12/07/2011   Axis IV: other psychosocial or environmental problems Axis V: 51-60 moderate symptoms  ADL's:  Intact  Sleep: Good, thanks to Trazodone  Appetite:  Fair  Suicidal Ideation:  Pt denies any thoughts, plans, intent of suicide\ Homicidal Ideation:  Pt denies any thoughts, plans, intent of homicide  AEB (as evidenced by):per pt report  Psychiatric Specialty Exam: ROS  Blood pressure 113/73, pulse 68, height 5\' 7"  (1.702 m), weight 154 lb 3.2 oz (69.945 kg).Body mass index is 24.15 kg/(m^2).  General Appearance: Casual  Eye Contact::  Good  Speech:  Clear and Coherent  Volume:  Normal  Mood: good   Affect:  Congruent  Thought Process:  Coherent, Linear and Logical  Orientation:  Full (Time, Place, and Person)  Thought Content:  WDL  Suicidal Thoughts:  No  Homicidal Thoughts:  No  Memory:  Immediate;   Good Recent;   Good Remote;   Good  Judgement:  Good  Insight:  Good  Psychomotor Activity:  TYpical  Concentration:  Good  Recall:  Good  Akathisia:  No  Handed:  Right  AIMS (if indicated):     Assets:  Communication Skills Desire for Improvement  Sleep:      Current Medications: Current Outpatient Prescriptions  Medication Sig Dispense Refill  . aspirin 81 MG tablet Take 1 tablet (81 mg total) by mouth daily. For Polycythemia Vera.    . fluticasone (FLONASE) 50 MCG/ACT nasal spray Place 1 spray into both nostrils daily as needed.  1  . ketorolac (ACULAR) 0.5 % ophthalmic solution Place 1 drop into both eyes daily as needed.    . Multiple Vitamin (MULTIVITAMIN WITH MINERALS) TABS tablet Take 1 tablet by mouth daily.    . pantoprazole (PROTONIX) 40 MG tablet Take 40 mg by mouth as needed.   0  . sertraline (ZOLOFT) 100 MG tablet Take 1.5 tablets (150 mg total) by mouth daily. 45 tablet 3  . traMADol (ULTRAM) 50 MG tablet Take 50 mg by mouth every  8 (eight) hours as needed.    . traZODone (DESYREL) 50 MG tablet Take 1 tablet (50 mg total) by mouth at bedtime. For sleep. 30 tablet 3   No current facility-administered medications for this visit.   Lab Results:  Results for orders placed or performed in visit on 06/27/15 (from the past 8736 hour(s))  CBC with Differential/Platelet   Collection Time: 06/27/15  9:37 AM  Result Value Ref Range   WBC 6.4 3.4 - 10.8 x10E3/uL   RBC 4.57 3.77 - 5.28 x10E6/uL   Hemoglobin 12.4 11.1 - 15.9 g/dL   Hematocrit 39.1 34.0 - 46.6 %   MCV 86 79 - 97 fL   MCH 27.1 26.6 - 33.0 pg   MCHC 31.7 31.5 - 35.7 g/dL  RDW 15.4 12.3 - 15.4 %   Platelets 326 150 - 379 x10E3/uL   Neutrophils 57 %   Lymphs 31 %   Monocytes 8 %   Eos 3 %   Basos 1 %   Neutrophils Absolute 3.7 1.4 - 7.0 x10E3/uL   Lymphocytes Absolute 2.0 0.7 - 3.1 x10E3/uL   Monocytes Absolute 0.5 0.1 - 0.9 x10E3/uL   EOS (ABSOLUTE) 0.2 0.0 - 0.4 x10E3/uL   Basophils Absolute 0.0 0.0 - 0.2 x10E3/uL   Immature Granulocytes 0 %   Immature Grans (Abs) 0.0 0.0 - 0.1 x10E3/uL  Comprehensive metabolic panel   Collection Time: 06/27/15  9:37 AM  Result Value Ref Range   Glucose 93 65 - 99 mg/dL   BUN 5 (L) 6 - 24 mg/dL   Creatinine, Ser 0.59 0.57 - 1.00 mg/dL   GFR calc non Af Amer 109 >59 mL/min/1.73   GFR calc Af Amer 126 >59 mL/min/1.73   BUN/Creatinine Ratio 8 (L) 9 - 23   Sodium 142 134 - 144 mmol/L   Potassium 4.0 3.5 - 5.2 mmol/L   Chloride 102 97 - 108 mmol/L   CO2 22 18 - 29 mmol/L   Calcium 9.1 8.7 - 10.2 mg/dL   Total Protein 6.2 6.0 - 8.5 g/dL   Albumin 4.2 3.5 - 5.5 g/dL   Globulin, Total 2.0 1.5 - 4.5 g/dL   Albumin/Globulin Ratio 2.1 1.1 - 2.5   Bilirubin Total <0.2 0.0 - 1.2 mg/dL   Alkaline Phosphatase 56 39 - 117 IU/L   AST 10 0 - 40 IU/L   ALT 9 0 - 32 IU/L  Ferritin   Collection Time: 06/27/15  9:37 AM  Result Value Ref Range   Ferritin 9 (L) 15 - 150 ng/mL  Results for orders placed or performed during the  hospital encounter of 03/11/15 (from the past 8736 hour(s))  Basic metabolic panel   Collection Time: 03/11/15  8:05 PM  Result Value Ref Range   Sodium 138 135 - 145 mmol/L   Potassium 3.5 3.5 - 5.1 mmol/L   Chloride 104 96 - 112 mmol/L   CO2 27 19 - 32 mmol/L   Glucose, Bld 115 (H) 70 - 99 mg/dL   BUN 5 (L) 6 - 23 mg/dL   Creatinine, Ser 0.57 0.50 - 1.10 mg/dL   Calcium 8.9 8.4 - 10.5 mg/dL   GFR calc non Af Amer >90 >90 mL/min   GFR calc Af Amer >90 >90 mL/min   Anion gap 7 5 - 15  CBC with Differential   Collection Time: 03/11/15  8:05 PM  Result Value Ref Range   WBC 11.4 (H) 4.0 - 10.5 K/uL   RBC 4.18 3.87 - 5.11 MIL/uL   Hemoglobin 12.1 12.0 - 15.0 g/dL   HCT 36.6 36.0 - 46.0 %   MCV 87.6 78.0 - 100.0 fL   MCH 28.9 26.0 - 34.0 pg   MCHC 33.1 30.0 - 36.0 g/dL   RDW 14.8 11.5 - 15.5 %   Platelets 295 150 - 400 K/uL   Neutrophils Relative % 83 (H) 43 - 77 %   Neutro Abs 9.5 (H) 1.7 - 7.7 K/uL   Lymphocytes Relative 11 (L) 12 - 46 %   Lymphs Abs 1.3 0.7 - 4.0 K/uL   Monocytes Relative 6 3 - 12 %   Monocytes Absolute 0.7 0.1 - 1.0 K/uL   Eosinophils Relative 0 0 - 5 %   Eosinophils Absolute 0.0 0.0 - 0.7 K/uL  Basophils Relative 0 0 - 1 %   Basophils Absolute 0.0 0.0 - 0.1 K/uL  Lactic acid, plasma   Collection Time: 03/11/15  9:29 PM  Result Value Ref Range   Lactic Acid, Venous 0.8 0.5 - 2.0 mmol/L  Results for orders placed or performed during the hospital encounter of 02/25/15 (from the past 8736 hour(s))  Hemoglobin and hematocrit, blood   Collection Time: 02/25/15 11:04 AM  Result Value Ref Range   Hemoglobin 13.0 12.0 - 15.0 g/dL   HCT 39.9 36.0 - 46.0 %  Results for orders placed or performed in visit on 02/08/15 (from the past 8736 hour(s))  Cytology - PAP   Collection Time: 02/08/15 12:00 AM  Result Value Ref Range   CYTOLOGY - PAP PAP RESULT   Results for orders placed or performed during the hospital encounter of 10/22/14 (from the past 8736 hour(s))   Hemoglobin and hematocrit, blood   Collection Time: 10/22/14 11:20 AM  Result Value Ref Range   Hemoglobin 13.1 12.0 - 15.0 g/dL   HCT 39.5 36.0 - 46.0 %  Results for orders placed or performed during the hospital encounter of 09/10/14 (from the past 8736 hour(s))  Clotest (H. pylori), biopsy   Collection Time: 09/10/14  3:00 PM  Result Value Ref Range   Helicobacter screen NEGATIVE (A) UREASE NEGATIVE   Physical Findings: AIMS:  , ,  ,  ,    CIWA:    COWS:     Treatment Plan Summary: Medication management  Plan: I will continue Zoloft or depression and trazodone for sleep.  Recommend to call us back if she has any question concerns or if she feels worsening symptoms.  Followup in 4 months.  MEDICATIONS this encounter: Meds ordered this encounter  Medications  . traMADol (ULTRAM) 50 MG tablet    Sig: Take 50 mg by mouth every 8 (eight) hours as needed.  . traZODone (DESYREL) 50 MG tablet    Sig: Take 1 tablet (50 mg total) by mouth at bedtime. For sleep.    Dispense:  30 tablet    Refill:  3  . sertraline (ZOLOFT) 100 MG tablet    Sig: Take 1.5 tablets (150 mg total) by mouth daily.    Dispense:  45 tablet    Refill:  3    Medical Decision Making Problem Points:  Established problem, stable/improving (1) and Review of last therapy session (1) Data Points:  Review or order clinical lab tests (1) Review of medication regiment & side effects (2)  ROSS, [place] 06/30/2015, 4:16 PM

## 2015-07-01 ENCOUNTER — Other Ambulatory Visit (HOSPITAL_COMMUNITY): Payer: Self-pay | Admitting: Oncology

## 2015-07-01 ENCOUNTER — Encounter (HOSPITAL_COMMUNITY)
Admission: RE | Admit: 2015-07-01 | Discharge: 2015-07-01 | Disposition: A | Discharge status: Home / Self Care | Payer: BC Managed Care – PPO | Source: Ambulatory Visit | Attending: Oncology | Admitting: Oncology

## 2015-07-01 ENCOUNTER — Encounter (HOSPITAL_COMMUNITY): Payer: Self-pay

## 2015-07-01 DIAGNOSIS — D45 Polycythemia vera: Secondary | ICD-10-CM | POA: Insufficient documentation

## 2015-07-01 NOTE — Procedures (Signed)
CBC was drawn 06-27-15 per Dr Beryle Beams, so hgb not drawn today prior to plebotomy. HGB 12.4. Phlebotomy of 500 gms drawn. Patient tolerated well. Denies dizziness.  VSS. Drinking water before, during and after treatment.

## 2015-07-04 ENCOUNTER — Encounter: Payer: Self-pay | Admitting: Oncology

## 2015-07-04 ENCOUNTER — Ambulatory Visit (INDEPENDENT_AMBULATORY_CARE_PROVIDER_SITE_OTHER): Payer: BC Managed Care – PPO | Admitting: Oncology

## 2015-07-04 VITALS — BP 115/64 | HR 73 | Temp 97.8°F | Ht 67.0 in | Wt 155.7 lb

## 2015-07-04 DIAGNOSIS — F329 Major depressive disorder, single episode, unspecified: Secondary | ICD-10-CM | POA: Diagnosis not present

## 2015-07-04 DIAGNOSIS — M797 Fibromyalgia: Secondary | ICD-10-CM | POA: Diagnosis not present

## 2015-07-04 DIAGNOSIS — D45 Polycythemia vera: Secondary | ICD-10-CM | POA: Diagnosis not present

## 2015-07-04 NOTE — Patient Instructions (Signed)
Continue every 4 month phlebotomy at [facility] short stay Return visit with Dr Darnell Level in 1 year Lab 1-2 weeks before visit

## 2015-07-04 NOTE — Progress Notes (Signed)
Patient ID: Cheryl Cross, female   DOB: 1966/04/28, 49 y.o.   MRN: [id number] Hematology and Oncology Follow Up Visit  Cheryl Cross [id number] Jan 17, 1966 49 y.o. 07/04/2015 11:25 AM   Principle Diagnosis: Encounter Diagnosis  Name Primary?  . 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 Extensive evaluation in the past to exclude other causes of polycythemia was unremarkable. Please see my summary note dated 06/14/2013 for additional details. She had persistent and progressive elevation of her hemoglobin associated with constitutional symptoms and was therefore started on a phlebotomy program. She had prompt symptom relief. She is now requiring phlebotomy just every 4 months. Hemoglobin stable at 12.4 g. .   Interim History:   She is doing much better at this time. She severed from significant depression for many years. She remains on Zoloft and trazodone. She had an upper endoscopy on 09/10/2014 2 evaluate a 3 month history of epigastric pain and nausea on responsive to proton pump inhibitor therapy. She was found to have low level erosive antral gastritis. She's had no other interim medical problems. Much of her chronic fatigue syndrome has resolved now that her mood is better controlled on antidepressives. 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   Medications: reviewed  Allergies:  Allergies  Allergen Reactions  . Cyclobenzaprine Other (See Comments)    Hot sensation  . Diclofenac Sodium Itching  . Gabapentin Hives  . Penicillins Other (See Comments)    Unknown child hood reaction  . Robaxin [Methocarbamol] Hives  . Voltaren [Diclofenac Sodium] Itching    Review of Systems: See history of present illness Remaining ROS negative:   Physical Exam: Blood pressure 115/64, pulse 73, temperature 97.8 F (36.6 C), temperature source Oral, height 5\' 7"  (1.702 m),  weight 155 lb 11.2 oz (70.625 kg), SpO2 99 %. Wt Readings from Last 3 Encounters:  07/04/15 155 lb 11.2 oz (70.625 kg)  07/01/15 155 lb (70.308 kg)  06/30/15 154 lb 3.2 oz (69.945 kg)     General appearance: Well-nourished Caucasian woman HENNT: Pharynx no erythema, exudate, mass, or ulcer. No thyromegaly or thyroid nodules Lymph nodes: No cervical, supraclavicular, or axillary lymphadenopathy Breasts: Lungs: Clear to auscultation, resonant to percussion throughout Heart: Regular rhythm, no murmur, no gallop, no rub, no click, no edema Abdomen: Soft, nontender, normal bowel sounds, no mass, no organomegaly Extremities: No edema, no calf tenderness Musculoskeletal: no joint deformities GU:  Vascular: Carotid pulses 2+, no bruits,  Neurologic: Alert, oriented, PERRLA, optic discs sharp and vessels normal, no hemorrhage or exudate, cranial nerves grossly normal, motor strength 5 over 5, reflexes 1+ symmetric, upper body coordination normal, gait normal, Skin: No rash or ecchymosis  Lab Results: CBC W/Diff    Component Value Date/Time   WBC 6.4 06/27/2015 0937   WBC 11.4* 03/11/2015 2005   WBC 8.3 06/05/2013 1327   RBC 4.57 06/27/2015 0937   RBC 4.18 03/11/2015 2005   RBC 4.31 06/05/2013 1327   HGB 12.1 03/11/2015 2005   HGB 12.8 06/05/2013 1327   HCT 39.1 06/27/2015 0937   HCT 36.6 03/11/2015 2005   HCT 38.6 06/05/2013 1327   PLT 295 03/11/2015 2005   PLT 317 06/05/2013 1327   MCV 87.6 03/11/2015 2005   MCV 89.6 06/05/2013 1327   MCH 27.1 06/27/2015 0937   MCH 28.9 03/11/2015 2005   MCH 29.8 06/05/2013 1327   MCHC 31.7 06/27/2015 0937  MCHC 33.1 03/11/2015 2005   MCHC 33.2 06/05/2013 1327   RDW 15.4 06/27/2015 0937   RDW 14.8 03/11/2015 2005   RDW 14.4 06/05/2013 1327   LYMPHSABS 2.0 06/27/2015 0937   LYMPHSABS 1.3 03/11/2015 2005   LYMPHSABS 2.3 06/05/2013 1327   MONOABS 0.7 03/11/2015 2005   MONOABS 0.6 06/05/2013 1327   EOSABS 0.0 03/11/2015 2005   EOSABS 0.2  06/05/2013 1327   BASOSABS 0.0 06/27/2015 0937   BASOSABS 0.0 03/11/2015 2005   BASOSABS 0.1 06/05/2013 1327     Chemistry      Component Value Date/Time   NA 142 06/27/2015 0937   NA 138 03/11/2015 2005   NA 139 06/05/2013 1327   K 4.0 06/27/2015 0937   K 4.0 06/05/2013 1327   CL 102 06/27/2015 0937   CO2 22 06/27/2015 0937   CO2 26 06/05/2013 1327   BUN 5* 06/27/2015 0937   BUN 5* 03/11/2015 2005   BUN 4.2* 06/05/2013 1327   CREATININE 0.59 06/27/2015 0937   CREATININE 0.57 05/31/2014 1000   CREATININE 0.8 06/05/2013 1327      Component Value Date/Time   CALCIUM 9.1 06/27/2015 0937   CALCIUM 9.2 06/05/2013 1327   ALKPHOS 56 06/27/2015 0937   ALKPHOS 63 06/05/2013 1327   AST 10 06/27/2015 0937   AST 7 06/05/2013 1327   ALT 9 06/27/2015 0937   ALT <6 Repeated and Verified 06/05/2013 1327   BILITOT <0.2 06/27/2015 0937   BILITOT 0.4 05/31/2014 1000   BILITOT 0.29 06/05/2013 1327     ferritin 9 on 06/27/2015    Radiological Studies: No results found.  Impression:  #1. Polycythemia vera  Stable on intermittent phlebotomy every 4 months at the [facility] short stay unit and low-dose aspirin Plan: continue the same.  #2. Fibromyalgia syndrome  #3. Reactive depression secondary to #2. Controlled on current medications. #4. Erosive gastritis resolved with treatment   CC: Patient Care Team: Caren Macadam, MD as PCP - General (Family Medicine)   Annia Belt, MD 8/22/201611:25 AM

## 2015-08-18 ENCOUNTER — Other Ambulatory Visit: Payer: Self-pay | Admitting: Oncology

## 2015-08-25 ENCOUNTER — Telehealth (HOSPITAL_COMMUNITY): Payer: Self-pay | Admitting: *Deleted

## 2015-10-26 ENCOUNTER — Ambulatory Visit (HOSPITAL_COMMUNITY): Payer: Self-pay | Admitting: Psychiatry

## 2015-10-28 ENCOUNTER — Encounter (HOSPITAL_COMMUNITY): Payer: BC Managed Care – PPO

## 2015-11-02 ENCOUNTER — Other Ambulatory Visit: Payer: Self-pay | Admitting: Oncology

## 2015-11-02 DIAGNOSIS — D45 Polycythemia vera: Secondary | ICD-10-CM

## 2015-11-03 ENCOUNTER — Telehealth: Payer: Self-pay | Admitting: *Deleted

## 2015-11-03 NOTE — Telephone Encounter (Signed)
-----   Message from Annia Belt, MD sent at 11/02/2015  3:55 PM EST ----- Please call pt: she is due for phlebotomy - I put in orders for tomorrow. She goes to [facility].  Have her call [facility] short stay to arrange day & time. Thanks DrG

## 2015-11-03 NOTE — Telephone Encounter (Signed)
Called pt yesterday afternoon - left message she is due for a phlebotomy and schedule it at [facility] per Dr Beryle Beams. And to call me back if she has any questions. Per [facility], she has an appt 12/27 @1100  AM @ [facility].

## 2015-11-04 ENCOUNTER — Ambulatory Visit (HOSPITAL_COMMUNITY): Payer: Self-pay | Admitting: Psychiatry

## 2015-11-08 ENCOUNTER — Encounter (HOSPITAL_COMMUNITY): Payer: Self-pay

## 2015-11-08 ENCOUNTER — Encounter (HOSPITAL_COMMUNITY)
Admission: RE | Admit: 2015-11-08 | Discharge: 2015-11-08 | Disposition: A | Discharge status: Home / Self Care | Payer: BC Managed Care – PPO | Source: Ambulatory Visit | Attending: Oncology | Admitting: Oncology

## 2015-11-08 VITALS — BP 112/54 | HR 77 | Temp 98.4°F | Resp 18

## 2015-11-08 DIAGNOSIS — D45 Polycythemia vera: Secondary | ICD-10-CM | POA: Diagnosis present

## 2015-11-08 LAB — HEMOGLOBIN: Hemoglobin: 12.6 g/dL (ref 12.0–15.0)

## 2015-11-08 NOTE — Progress Notes (Signed)
Cheryl Cross presents today for phlebotomy per MD orders. HGB  12.6Phlebotomy procedure started at 1145 AM and ended at 1150.500 cc removed. Patient tolerated procedure well. IV needle removed intact.

## 2015-11-25 ENCOUNTER — Ambulatory Visit (INDEPENDENT_AMBULATORY_CARE_PROVIDER_SITE_OTHER): Payer: BC Managed Care – PPO | Admitting: Psychiatry

## 2015-11-25 ENCOUNTER — Encounter (HOSPITAL_COMMUNITY): Payer: Self-pay | Admitting: Psychiatry

## 2015-11-25 VITALS — BP 120/70

## 2015-11-25 DIAGNOSIS — F429 Obsessive-compulsive disorder, unspecified: Secondary | ICD-10-CM

## 2015-11-25 DIAGNOSIS — F321 Major depressive disorder, single episode, moderate: Secondary | ICD-10-CM

## 2015-11-25 DIAGNOSIS — F341 Dysthymic disorder: Secondary | ICD-10-CM | POA: Diagnosis not present

## 2015-11-25 DIAGNOSIS — F5105 Insomnia due to other mental disorder: Secondary | ICD-10-CM

## 2015-11-25 DIAGNOSIS — F418 Other specified anxiety disorders: Secondary | ICD-10-CM

## 2015-11-25 MED ORDER — TRAZODONE HCL 50 MG PO TABS: 50.0000 mg | ORAL_TABLET | Freq: Every day | ORAL | Status: DC

## 2015-11-25 MED ORDER — SERTRALINE HCL 100 MG PO TABS: 150.0000 mg | ORAL_TABLET | Freq: Every day | ORAL | Status: DC

## 2015-11-25 NOTE — Progress Notes (Signed)
Patient ID: Cheryl Cross, female   DOB: Jun 12, 1966, 50 y.o.   MRN: [id number] Patient ID: Cheryl Cross, female   DOB: 14-Nov-1965, 50 y.o.   MRN: [id number] Patient ID: Cheryl Cross, female   DOB: 1966/01/31, 50 y.o.   MRN: [id number] Patient ID: Cheryl Cross, female   DOB: 05-30-1966, 51 y.o.   MRN: [id number] Patient ID: Cheryl Cross, female   DOB: December 11, 1965, 50 y.o.   MRN: [id number] Patient ID: Cheryl Cross, female   DOB: [birth year], 50 y.o.   MRN: [id number] Patient ID: Cheryl Cross, female   DOB: Jul 22, 1966, 50 y.o.   MRN: [id number] Patient ID: Cheryl Cross, female   DOB: 09/28/1966, 50 y.o.   MRN: [id number] Patient ID: Cheryl Cross, female   DOB: 1966-01-08, 50 y.o.   MRN: [id number] [facility] MD Progress Note [id number] Cheryl Cross  MRN:  [id number] DOB:: 1966/09/13 Age: 50 y.o. Date: 11/25/2015  Chief Complaint: Chief Complaint  Patient presents with  . Depression  . Anxiety  . Follow-up   History of present illness This patient is a 50 year old separated white female who lives alone in [place]. 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 She had back pain and fibromyalgia. She got hooked on taking too many Xanax and was sleeping all the time and unable to function. [facility] and got off the Xanax and since then has been on trazodone and Zoloft. 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 She sees Dr. Jefm Miles here which she finds very helpful. She denies being depressed and stays on a good regimen of eating and sleeping. Her mood is generally been good and she's not significantly anxious. She's not abusing any substances and she denies suicidal ideation  The patient returns after 4 months. She has been doing well. Her mood is good, she is sleeping well at  night and she is enjoying her job. She's not had any relapses into benzodiazepine abuse. She stays on a good regimen of sleeping and eating well. She denies suicidal ideation Diagnosis:   Axis I: Major Depression, Recurrent severe, Substance Abuse and Substance Induced Mood Disorder Axis II: Deferred Axis III:  Past Medical History  Diagnosis Date  . Fibromyalgia   . Bursitis     hips bilat  . Arthritis     back   . DDD (degenerative disc disease)   . Overactive bladder   . Depression   . PTSD (post-traumatic stress disorder)   . Anxiety   . Obsessive-compulsive disorder   . Polycythemia vera(238.4) 12/07/2011   Axis IV: other psychosocial or environmental problems Axis V: 51-60 moderate symptoms  ADL's:  Intact  Sleep: Good, thanks to Trazodone  Appetite:  Fair  Suicidal Ideation:  Pt denies any thoughts, plans, intent of suicide\ Homicidal Ideation:  Pt denies any thoughts, plans, intent of homicide  AEB (as evidenced by):per pt report  Psychiatric Specialty Exam: ROS Patient ID: Cheryl Cross, female   DOB: 1966-03-06, 50 y.o.   MRN: [id number] Patient ID: Cheryl Cross, female   DOB: 15-Sep-1966, 50 y.o.   MRN: [id number] Patient ID: Cheryl Cross, female   DOB: July 13, 1966, 50 y.o.   MRN: [id number] [facility] MD Progress Note [id number] Cheryl Cross  MRN:  [id number] DOB:: [birth year] Age: 50 y.o. Date: 11/25/2015  Chief Complaint:  Chief Complaint  Patient presents with  . Depression  . Anxiety  . Follow-up   t Diagnosis:   Axis I: Major Depression, Recurrent severe, Substance Abuse and Substance Induced Mood Disorder Axis II: Deferred Axis III:  Past Medical History  Diagnosis Date  . Fibromyalgia   . Bursitis     hips bilat  . Arthritis     back   . DDD (degenerative disc disease)   . Overactive bladder   . Depression   . PTSD (post-traumatic stress disorder)   . Anxiety   . Obsessive-compulsive disorder   . Polycythemia vera(238.4)  12/07/2011   Axis IV: other psychosocial or environmental problems Axis V: 51-60 moderate symptoms  ADL's:  Intact  Sleep: Good, thanks to Trazodone  Appetite:  Fair  Suicidal Ideation:  Pt denies any thoughts, plans, intent of suicide\ Homicidal Ideation:  Pt denies any thoughts, plans, intent of homicide  AEB (as evidenced by):per pt report  Psychiatric Specialty Exam: ROS  Blood pressure 120/70.There is no weight on file to calculate BMI.  General Appearance: Casual  Eye Contact::  Good  Speech:  Clear and Coherent  Volume:  Normal  Mood: good   Affect:  Congruent  Thought Process:  Coherent, Linear and Logical  Orientation:  Full (Time, Place, and Person)  Thought Content:  WDL  Suicidal Thoughts:  No  Homicidal Thoughts:  No  Memory:  Immediate;   Good Recent;   Good Remote;   Good  Judgement:  Good  Insight:  Good  Psychomotor Activity:  TYpical  Concentration:  Good  Recall:  Good  Akathisia:  No  Handed:  Right  AIMS (if indicated):     Assets:  Communication Skills Desire for Improvement  Sleep:      Current Medications: Current Outpatient Prescriptions  Medication Sig Dispense Refill  . aspirin 81 MG tablet Take 1 tablet (81 mg total) by mouth daily. For Polycythemia Vera.    . fluticasone (FLONASE) 50 MCG/ACT nasal spray Place 1 spray into both nostrils daily as needed.  1  . ketorolac (ACULAR) 0.5 % ophthalmic solution Place 1 drop into both eyes daily as needed.    . Multiple Vitamin (MULTIVITAMIN WITH MINERALS) TABS tablet Take 1 tablet by mouth daily.    . pantoprazole (PROTONIX) 40 MG tablet Take 40 mg by mouth as needed.   0  . sertraline (ZOLOFT) 100 MG tablet Take 1.5 tablets (150 mg total) by mouth daily. 45 tablet 3  . traMADol (ULTRAM) 50 MG tablet Take 50 mg by mouth every 8 (eight) hours as needed.    . traZODone (DESYREL) 50 MG tablet Take 1 tablet (50 mg total) by mouth at bedtime. For sleep. 30 tablet 3   No current  facility-administered medications for this visit.   Lab Results:  Results for orders placed or performed during the hospital encounter of 11/08/15 (from the past 8736 hour(s))  Hemoglobin   Collection Time: 11/08/15 10:55 AM  Result Value Ref Range   Hemoglobin 12.6 12.0 - 15.0 g/dL  Results for orders placed or performed in visit on 06/27/15 (from the past 8736 hour(s))  CBC with Differential/Platelet   Collection Time: 06/27/15  9:37 AM  Result Value Ref Range   WBC 6.4 3.4 - 10.8 x10E3/uL   RBC 4.57 3.77 - 5.28 x10E6/uL   Hemoglobin 12.4 11.1 - 15.9 g/dL   Hematocrit 39.1 34.0 - 46.6 %   MCV 86 79 - 97 fL   MCH 27.1 26.6 -  33.0 pg   MCHC 31.7 31.5 - 35.7 g/dL   RDW 15.4 12.3 - 15.4 %   Platelets 326 150 - 379 x10E3/uL   Neutrophils 57 %   Lymphs 31 %   Monocytes 8 %   Eos 3 %   Basos 1 %   Neutrophils Absolute 3.7 1.4 - 7.0 x10E3/uL   Lymphocytes Absolute 2.0 0.7 - 3.1 x10E3/uL   Monocytes Absolute 0.5 0.1 - 0.9 x10E3/uL   EOS (ABSOLUTE) 0.2 0.0 - 0.4 x10E3/uL   Basophils Absolute 0.0 0.0 - 0.2 x10E3/uL   Immature Granulocytes 0 %   Immature Grans (Abs) 0.0 0.0 - 0.1 x10E3/uL  Comprehensive metabolic panel   Collection Time: 06/27/15  9:37 AM  Result Value Ref Range   Glucose 93 65 - 99 mg/dL   BUN 5 (L) 6 - 24 mg/dL   Creatinine, Ser 0.59 0.57 - 1.00 mg/dL   GFR calc non Af Amer 109 >59 mL/min/1.73   GFR calc Af Amer 126 >59 mL/min/1.73   BUN/Creatinine Ratio 8 (L) 9 - 23   Sodium 142 134 - 144 mmol/L   Potassium 4.0 3.5 - 5.2 mmol/L   Chloride 102 97 - 108 mmol/L   CO2 22 18 - 29 mmol/L   Calcium 9.1 8.7 - 10.2 mg/dL   Total Protein 6.2 6.0 - 8.5 g/dL   Albumin 4.2 3.5 - 5.5 g/dL   Globulin, Total 2.0 1.5 - 4.5 g/dL   Albumin/Globulin Ratio 2.1 1.1 - 2.5   Bilirubin Total <0.2 0.0 - 1.2 mg/dL   Alkaline Phosphatase 56 39 - 117 IU/L   AST 10 0 - 40 IU/L   ALT 9 0 - 32 IU/L  Ferritin   Collection Time: 06/27/15  9:37 AM  Result Value Ref Range   Ferritin 9  (L) 15 - 150 ng/mL  Results for orders placed or performed during the hospital encounter of 03/11/15 (from the past 8736 hour(s))  Basic metabolic panel   Collection Time: 03/11/15  8:05 PM  Result Value Ref Range   Sodium 138 135 - 145 mmol/L   Potassium 3.5 3.5 - 5.1 mmol/L   Chloride 104 96 - 112 mmol/L   CO2 27 19 - 32 mmol/L   Glucose, Bld 115 (H) 70 - 99 mg/dL   BUN 5 (L) 6 - 23 mg/dL   Creatinine, Ser 0.57 0.50 - 1.10 mg/dL   Calcium 8.9 8.4 - 10.5 mg/dL   GFR calc non Af Amer >90 >90 mL/min   GFR calc Af Amer >90 >90 mL/min   Anion gap 7 5 - 15  CBC with Differential   Collection Time: 03/11/15  8:05 PM  Result Value Ref Range   WBC 11.4 (H) 4.0 - 10.5 K/uL   RBC 4.18 3.87 - 5.11 MIL/uL   Hemoglobin 12.1 12.0 - 15.0 g/dL   HCT 36.6 36.0 - 46.0 %   MCV 87.6 78.0 - 100.0 fL   MCH 28.9 26.0 - 34.0 pg   MCHC 33.1 30.0 - 36.0 g/dL   RDW 14.8 11.5 - 15.5 %   Platelets 295 150 - 400 K/uL   Neutrophils Relative % 83 (H) 43 - 77 %   Neutro Abs 9.5 (H) 1.7 - 7.7 K/uL   Lymphocytes Relative 11 (L) 12 - 46 %   Lymphs Abs 1.3 0.7 - 4.0 K/uL   Monocytes Relative 6 3 - 12 %   Monocytes Absolute 0.7 0.1 - 1.0 K/uL   Eosinophils Relative 0 0 -  5 %   Eosinophils Absolute 0.0 0.0 - 0.7 K/uL   Basophils Relative 0 0 - 1 %   Basophils Absolute 0.0 0.0 - 0.1 K/uL  Lactic acid, plasma   Collection Time: 03/11/15  9:29 PM  Result Value Ref Range   Lactic Acid, Venous 0.8 0.5 - 2.0 mmol/L  Results for orders placed or performed during the hospital encounter of 02/25/15 (from the past 8736 hour(s))  Hemoglobin and hematocrit, blood   Collection Time: 02/25/15 11:04 AM  Result Value Ref Range   Hemoglobin 13.0 12.0 - 15.0 g/dL   HCT 39.9 36.0 - 46.0 %  Results for orders placed or performed in visit on 02/08/15 (from the past 8736 hour(s))  Cytology - PAP   Collection Time: 02/08/15 12:00 AM  Result Value Ref Range   CYTOLOGY - PAP PAP RESULT    Physical Findings: AIMS:  , ,  ,  ,     CIWA:    COWS:     Treatment Plan Summary: Medication management  Plan: I will continue Zoloft or depression and trazodone for sleep.  Recommend to call us back if she has any question concerns or if she feels worsening symptoms.  Followup in 4 months.  MEDICATIONS this encounter: Meds ordered this encounter  Medications  . sertraline (ZOLOFT) 100 MG tablet    Sig: Take 1.5 tablets (150 mg total) by mouth daily.    Dispense:  45 tablet    Refill:  3  . traZODone (DESYREL) 50 MG tablet    Sig: Take 1 tablet (50 mg total) by mouth at bedtime. For sleep.    Dispense:  30 tablet    Refill:  3    Medical Decision Making Problem Points:  Established problem, stable/improving (1) and Review of last therapy session (1) Data Points:  Review or order clinical lab tests (1) Review of medication regiment & side effects (2)  ROSS, [place] 11/25/2015, 3:12 PM

## 2015-12-14 DIAGNOSIS — M2580 Other specified joint disorders, unspecified joint: Secondary | ICD-10-CM

## 2015-12-14 HISTORY — DX: Other specified joint disorders, unspecified joint: M25.80

## 2016-03-02 ENCOUNTER — Other Ambulatory Visit: Payer: Self-pay | Admitting: Oncology

## 2016-03-02 DIAGNOSIS — D45 Polycythemia vera: Secondary | ICD-10-CM

## 2016-03-09 ENCOUNTER — Other Ambulatory Visit (HOSPITAL_COMMUNITY): Payer: Self-pay | Admitting: Internal Medicine

## 2016-03-09 ENCOUNTER — Encounter (HOSPITAL_COMMUNITY)
Admission: RE | Admit: 2016-03-09 | Discharge: 2016-03-09 | Disposition: A | Discharge status: Home / Self Care | Payer: BC Managed Care – PPO | Source: Ambulatory Visit | Attending: Oncology | Admitting: Oncology

## 2016-03-09 ENCOUNTER — Encounter (HOSPITAL_COMMUNITY): Payer: Self-pay

## 2016-03-09 VITALS — BP 110/69 | HR 76 | Temp 98.3°F | Resp 16 | Ht 67.0 in | Wt 155.7 lb

## 2016-03-09 DIAGNOSIS — D45 Polycythemia vera: Secondary | ICD-10-CM | POA: Insufficient documentation

## 2016-03-09 DIAGNOSIS — Z1231 Encounter for screening mammogram for malignant neoplasm of breast: Secondary | ICD-10-CM

## 2016-03-09 HISTORY — DX: Other specified joint disorders, unspecified joint: M25.80

## 2016-03-09 LAB — HEMOGLOBIN: Hemoglobin: 12.2 g/dL (ref 12.0–15.0)

## 2016-03-09 NOTE — Procedures (Addendum)
Patient here for therapeutic phlebotomy. Hgb 12.2. 500cc blood removed. Patient tolerated procedure well. VSS.

## 2016-03-23 ENCOUNTER — Encounter (HOSPITAL_COMMUNITY): Payer: Self-pay | Admitting: Psychiatry

## 2016-03-23 ENCOUNTER — Ambulatory Visit (INDEPENDENT_AMBULATORY_CARE_PROVIDER_SITE_OTHER): Payer: BC Managed Care – PPO | Admitting: Psychiatry

## 2016-03-23 VITALS — BP 128/81 | HR 82 | Ht 67.0 in | Wt 154.8 lb

## 2016-03-23 DIAGNOSIS — F418 Other specified anxiety disorders: Secondary | ICD-10-CM

## 2016-03-23 DIAGNOSIS — F332 Major depressive disorder, recurrent severe without psychotic features: Secondary | ICD-10-CM

## 2016-03-23 DIAGNOSIS — F5105 Insomnia due to other mental disorder: Secondary | ICD-10-CM

## 2016-03-23 DIAGNOSIS — F1914 Other psychoactive substance abuse with psychoactive substance-induced mood disorder: Secondary | ICD-10-CM

## 2016-03-23 DIAGNOSIS — F321 Major depressive disorder, single episode, moderate: Secondary | ICD-10-CM

## 2016-03-23 DIAGNOSIS — F429 Obsessive-compulsive disorder, unspecified: Secondary | ICD-10-CM

## 2016-03-23 MED ORDER — SERTRALINE HCL 100 MG PO TABS: 150.0000 mg | ORAL_TABLET | Freq: Every day | ORAL | Status: DC

## 2016-03-23 MED ORDER — TRAZODONE HCL 50 MG PO TABS: 50.0000 mg | ORAL_TABLET | Freq: Every day | ORAL | Status: DC

## 2016-03-23 NOTE — Progress Notes (Signed)
Patient ID: Cheryl Cross, female   DOB: 07-Mar-1966, 50 y.o.   MRN: [id number] Patient ID: Cheryl Cross, female   DOB: 06/12/1966, 50 y.o.   MRN: [id number] Patient ID: Cheryl Cross, female   DOB: 10/24/1966, 50 y.o.   MRN: [id number] Patient ID: Cheryl Cross, female   DOB: December 06, 1965, 50 y.o.   MRN: [id number] Patient ID: Cheryl Cross, female   DOB: 1966-03-07, 50 y.o.   MRN: [id number] Patient ID: Cheryl Cross, female   DOB: [birth year], 50 y.o.   MRN: [id number] Patient ID: Cheryl Cross, female   DOB: 01-23-1966, 50 y.o.   MRN: [id number] Patient ID: Cheryl Cross, female   DOB: June 05, 1966, 50 y.o.   MRN: [id number] Patient ID: Cheryl Cross, female   DOB: [birth year], 50 y.o.   MRN: [id number] Patient ID: Cheryl Cross, female   DOB: 1966-05-24, 50 y.o.   MRN: [id number] [facility] MD Progress Note [id number] CHARVETTE KRENEK  MRN:  [id number] DOB:: 10/16/1966 Age: 50 y.o. Date: 03/23/2016  Chief Complaint: Chief Complaint  Patient presents with  . Depression  . Anxiety  . Follow-up   History of present illness This patient is a 50 year old separated white female who lives alone in [place]. 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 She had back pain and fibromyalgia. She got hooked on taking too many Xanax and was sleeping all the time and unable to function. [facility] and got off the Xanax and since then has been on trazodone and Zoloft. 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 She sees Dr. Jefm Miles here which she finds very helpful. She denies being depressed and stays on a good regimen of eating and sleeping. Her mood is generally been good and she's not significantly anxious. She's not abusing any substances and she denies suicidal ideation  The patient returns  after 4 months. She has been doing well. Her mood is good,and she is enjoying her job. She's not had any relapses into benzodiazepine abuse. She stays on a good regimen of sleeping and eating well. At times she is a little bit more trouble with sleep but she tends to worry a lot about her classes and she goes to bed at night. At this point she doesn't want to change the trazodone dosage She denies suicidal ideation Diagnosis:   Axis I: Major Depression, Recurrent severe, Substance Abuse and Substance Induced Mood Disorder Axis II: Deferred Axis III:  Past Medical History  Diagnosis Date  . Fibromyalgia   . Bursitis     hips bilat  . Arthritis     back   . DDD (degenerative disc disease)   . Overactive bladder   . Depression   . PTSD (post-traumatic stress disorder)   . Anxiety   . Obsessive-compulsive disorder   . Polycythemia vera(238.4) 12/07/2011  . Sesamoiditis February 2017   Axis IV: other psychosocial or environmental problems Axis V: 51-60 moderate symptoms  ADL's:  Intact  Sleep: Good, thanks to Trazodone  Appetite:  Fair  Suicidal Ideation:  Pt denies any thoughts, plans, intent of suicide\ Homicidal Ideation:  Pt denies any thoughts, plans, intent of homicide  AEB (as evidenced by):per pt report  Psychiatric Specialty Exam: ROS Patient ID: Cheryl Cross, female   DOB: [birth year], 50 y.o.   MRN: [id number] Patient  ID: Cheryl Cross, female   DOB: Dec 20, 1965, 50 y.o.   MRN: [id number] Patient ID: Cheryl Cross, female   DOB: [birth year], 50 y.o.   MRN: [id number] [facility] MD Progress Note [id number] Cheryl Cross  MRN:  [id number] DOB:: 27-Oct-1966 Age: 50 y.o. Date: 03/23/2016  Chief Complaint: Chief Complaint  Patient presents with  . Depression  . Anxiety  . Follow-up   t Diagnosis:   Axis I: Major Depression, Recurrent severe, Substance Abuse and Substance Induced Mood Disorder Axis II: Deferred Axis III:  Past Medical History  Diagnosis  Date  . Fibromyalgia   . Bursitis     hips bilat  . Arthritis     back   . DDD (degenerative disc disease)   . Overactive bladder   . Depression   . PTSD (post-traumatic stress disorder)   . Anxiety   . Obsessive-compulsive disorder   . Polycythemia vera(238.4) 12/07/2011  . Sesamoiditis February 2017   Axis IV: other psychosocial or environmental problems Axis V: 51-60 moderate symptoms  ADL's:  Intact  Sleep: Good, thanks to Trazodone  Appetite:  Fair  Suicidal Ideation:  Pt denies any thoughts, plans, intent of suicide\ Homicidal Ideation:  Pt denies any thoughts, plans, intent of homicide  AEB (as evidenced by):per pt report  Psychiatric Specialty Exam: ROS  Blood pressure 128/81, pulse 82, height 5\' 7"  (1.702 m), weight 154 lb 12.8 oz (70.217 kg), SpO2 97 %.Body mass index is 24.24 kg/(m^2).  General Appearance: Casual  Eye Contact::  Good  Speech:  Clear and Coherent  Volume:  Normal  Mood: good   Affect:  Congruent  Thought Process:  Coherent, Linear and Logical  Orientation:  Full (Time, Place, and Person)  Thought Content:  WDL  Suicidal Thoughts:  No  Homicidal Thoughts:  No  Memory:  Immediate;   Good Recent;   Good Remote;   Good  Judgement:  Good  Insight:  Good  Psychomotor Activity:  TYpical  Concentration:  Good  Recall:  Good  Akathisia:  No  Handed:  Right  AIMS (if indicated):     Assets:  Communication Skills Desire for Improvement  Sleep:      Current Medications: Current Outpatient Prescriptions  Medication Sig Dispense Refill  . aspirin 81 MG tablet Take 1 tablet (81 mg total) by mouth daily. For Polycythemia Vera.    Marland Kitchen ketorolac (ACULAR) 0.5 % ophthalmic solution Place 1 drop into both eyes daily as needed.    . sertraline (ZOLOFT) 100 MG tablet Take 1.5 tablets (150 mg total) by mouth daily. 45 tablet 3  . traZODone (DESYREL) 50 MG tablet Take 1 tablet (50 mg total) by mouth at bedtime. For sleep. 30 tablet 3   No current  facility-administered medications for this visit.   Lab Results:  Results for orders placed or performed during the hospital encounter of 03/09/16 (from the past 8736 hour(s))  Hemoglobin   Collection Time: 03/09/16  2:20 PM  Result Value Ref Range   Hemoglobin 12.2 12.0 - 15.0 g/dL  Results for orders placed or performed during the hospital encounter of 11/08/15 (from the past 8736 hour(s))  Hemoglobin   Collection Time: 11/08/15 10:55 AM  Result Value Ref Range   Hemoglobin 12.6 12.0 - 15.0 g/dL  Results for orders placed or performed in visit on 06/27/15 (from the past 8736 hour(s))  CBC with Differential/Platelet   Collection Time: 06/27/15  9:37 AM  Result Value Ref Range   WBC 6.4 3.4 -  10.8 x10E3/uL   RBC 4.57 3.77 - 5.28 x10E6/uL   Hemoglobin 12.4 11.1 - 15.9 g/dL   Hematocrit 39.1 34.0 - 46.6 %   MCV 86 79 - 97 fL   MCH 27.1 26.6 - 33.0 pg   MCHC 31.7 31.5 - 35.7 g/dL   RDW 15.4 12.3 - 15.4 %   Platelets 326 150 - 379 x10E3/uL   Neutrophils 57 %   Lymphs 31 %   Monocytes 8 %   Eos 3 %   Basos 1 %   Neutrophils Absolute 3.7 1.4 - 7.0 x10E3/uL   Lymphocytes Absolute 2.0 0.7 - 3.1 x10E3/uL   Monocytes Absolute 0.5 0.1 - 0.9 x10E3/uL   EOS (ABSOLUTE) 0.2 0.0 - 0.4 x10E3/uL   Basophils Absolute 0.0 0.0 - 0.2 x10E3/uL   Immature Granulocytes 0 %   Immature Grans (Abs) 0.0 0.0 - 0.1 x10E3/uL  Comprehensive metabolic panel   Collection Time: 06/27/15  9:37 AM  Result Value Ref Range   Glucose 93 65 - 99 mg/dL   BUN 5 (L) 6 - 24 mg/dL   Creatinine, Ser 0.59 0.57 - 1.00 mg/dL   GFR calc non Af Amer 109 >59 mL/min/1.73   GFR calc Af Amer 126 >59 mL/min/1.73   BUN/Creatinine Ratio 8 (L) 9 - 23   Sodium 142 134 - 144 mmol/L   Potassium 4.0 3.5 - 5.2 mmol/L   Chloride 102 97 - 108 mmol/L   CO2 22 18 - 29 mmol/L   Calcium 9.1 8.7 - 10.2 mg/dL   Total Protein 6.2 6.0 - 8.5 g/dL   Albumin 4.2 3.5 - 5.5 g/dL   Globulin, Total 2.0 1.5 - 4.5 g/dL   Albumin/Globulin Ratio  2.1 1.1 - 2.5   Bilirubin Total <0.2 0.0 - 1.2 mg/dL   Alkaline Phosphatase 56 39 - 117 IU/L   AST 10 0 - 40 IU/L   ALT 9 0 - 32 IU/L  Ferritin   Collection Time: 06/27/15  9:37 AM  Result Value Ref Range   Ferritin 9 (L) 15 - 150 ng/mL   Physical Findings: AIMS:  , ,  ,  ,    CIWA:    COWS:     Treatment Plan Summary: Medication management  Plan: I will continue Zoloft or depression and trazodone for sleep.  Recommend to call us back if she has any question concerns or if she feels worsening symptoms.  Followup in 4 months.  MEDICATIONS this encounter: Meds ordered this encounter  Medications  . sertraline (ZOLOFT) 100 MG tablet    Sig: Take 1.5 tablets (150 mg total) by mouth daily.    Dispense:  45 tablet    Refill:  3  . traZODone (DESYREL) 50 MG tablet    Sig: Take 1 tablet (50 mg total) by mouth at bedtime. For sleep.    Dispense:  30 tablet    Refill:  3    Medical Decision Making Problem Points:  Established problem, stable/improving (1) and Review of last therapy session (1) Data Points:  Review or order clinical lab tests (1) Review of medication regiment & side effects (2)  Tynleigh Birt, Bruin 03/23/2016, 4:31 PM

## 2016-04-30 ENCOUNTER — Ambulatory Visit (HOSPITAL_COMMUNITY)
Admission: RE | Admit: 2016-04-30 | Discharge: 2016-04-30 | Disposition: A | Discharge status: Home / Self Care | Payer: BC Managed Care – PPO | Source: Ambulatory Visit | Attending: Internal Medicine | Admitting: Internal Medicine

## 2016-04-30 DIAGNOSIS — Z1231 Encounter for screening mammogram for malignant neoplasm of breast: Secondary | ICD-10-CM | POA: Insufficient documentation

## 2016-05-07 ENCOUNTER — Other Ambulatory Visit: Payer: Self-pay | Admitting: Internal Medicine

## 2016-05-07 DIAGNOSIS — R928 Other abnormal and inconclusive findings on diagnostic imaging of breast: Secondary | ICD-10-CM

## 2016-05-08 ENCOUNTER — Other Ambulatory Visit: Payer: Self-pay | Admitting: Oncology

## 2016-05-08 DIAGNOSIS — D45 Polycythemia vera: Secondary | ICD-10-CM

## 2016-05-11 ENCOUNTER — Ambulatory Visit
Admission: RE | Admit: 2016-05-11 | Discharge: 2016-05-11 | Disposition: A | Discharge status: Home / Self Care | Payer: BC Managed Care – PPO | Source: Ambulatory Visit | Attending: Internal Medicine | Admitting: Internal Medicine

## 2016-05-11 DIAGNOSIS — R928 Other abnormal and inconclusive findings on diagnostic imaging of breast: Secondary | ICD-10-CM

## 2016-05-11 IMAGING — MG 2D DIGITAL DIAGNOSTIC UNILATERAL LEFT MAMMOGRAM WITH CAD AND ADJ
6 series · 6 of 14 positions shown · non-contrast
Comparison: Previous exams including recent screening mammogram
dated [DATE].

CLINICAL DATA: Patient returns today to evaluate a possible left
breast distortion identified on recent screening mammogram.

EXAM:
2D DIGITAL DIAGNOSTIC UNILATERAL LEFT MAMMOGRAM WITH CAD AND ADJUNCT
TOMO

[L CC synth-2D]
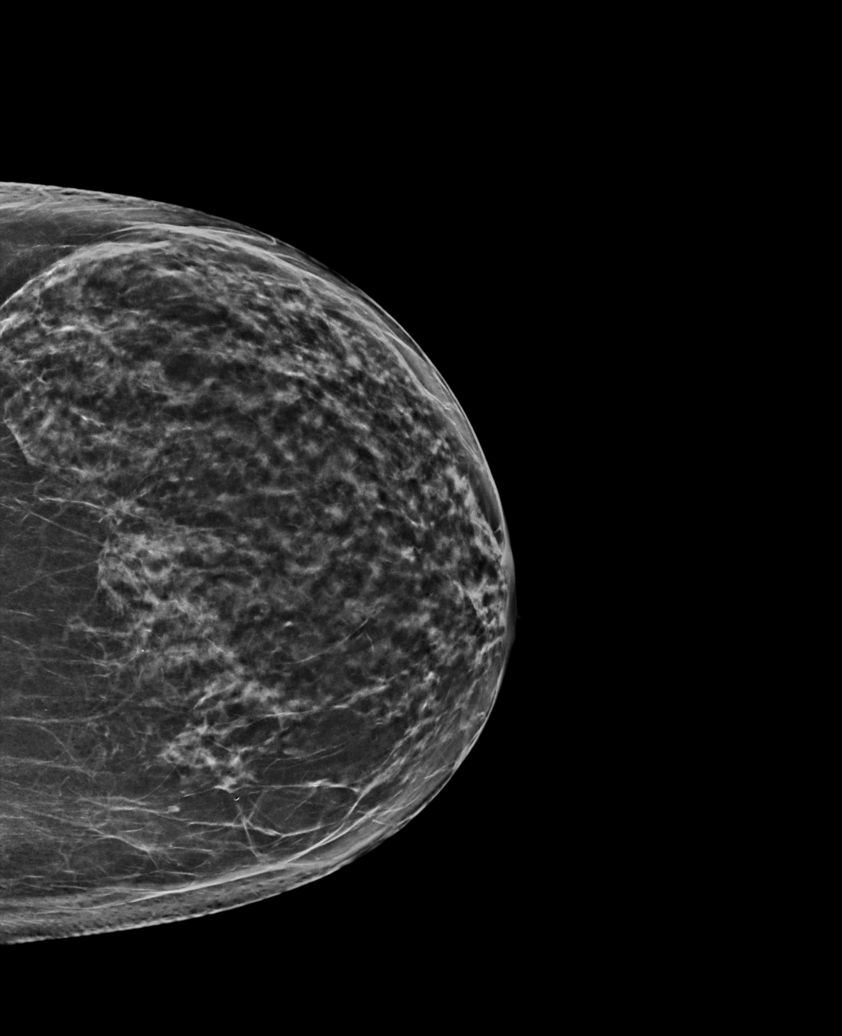

[L MLO]
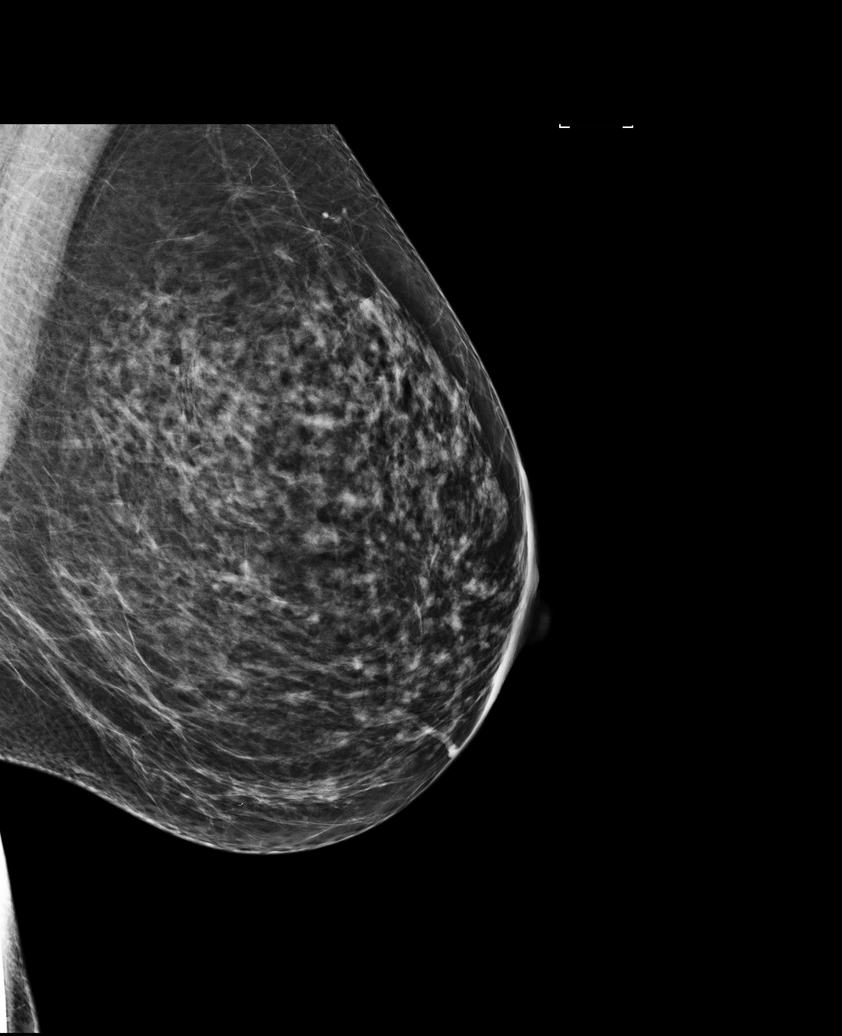

[L CC]
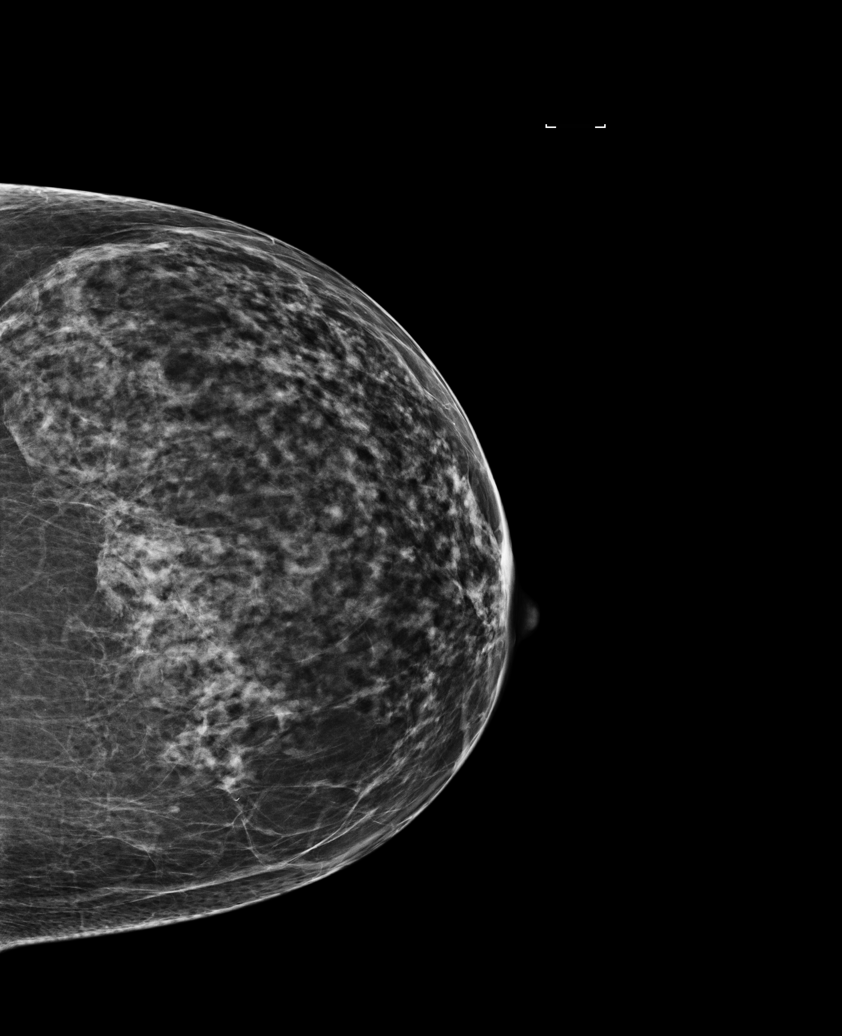

[L MLO synth-2D]
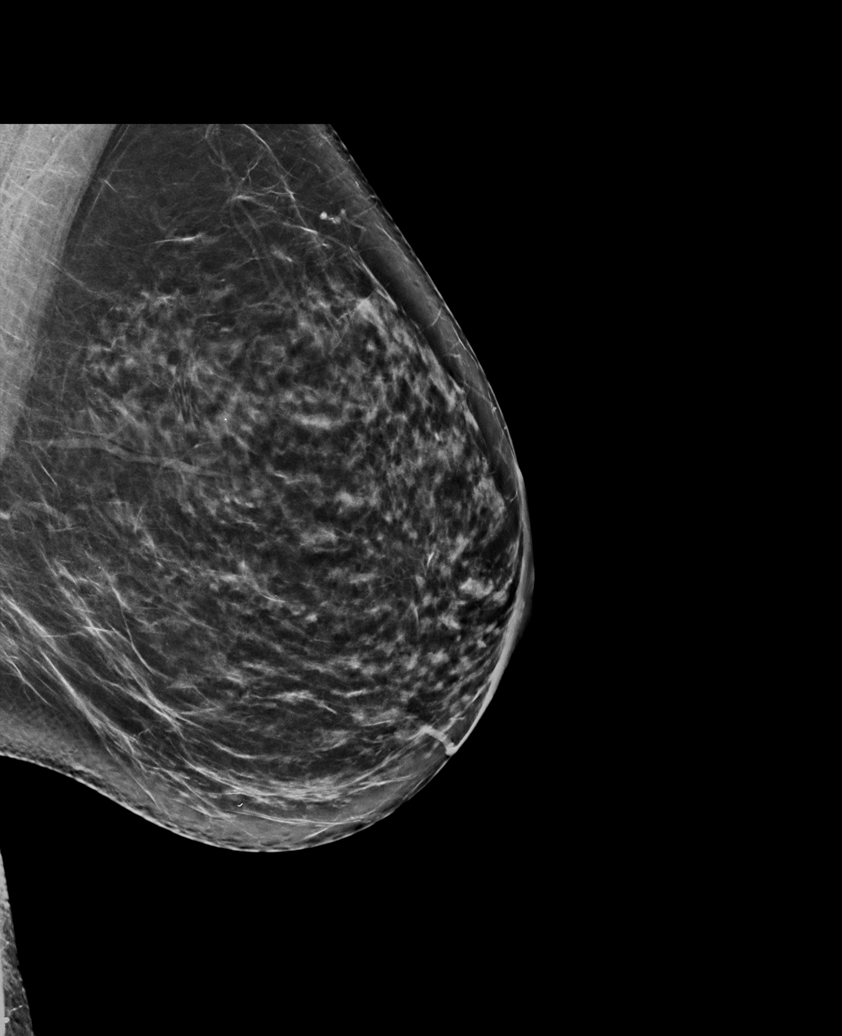

[L MLO tomo · tomo slice 40/79.0]
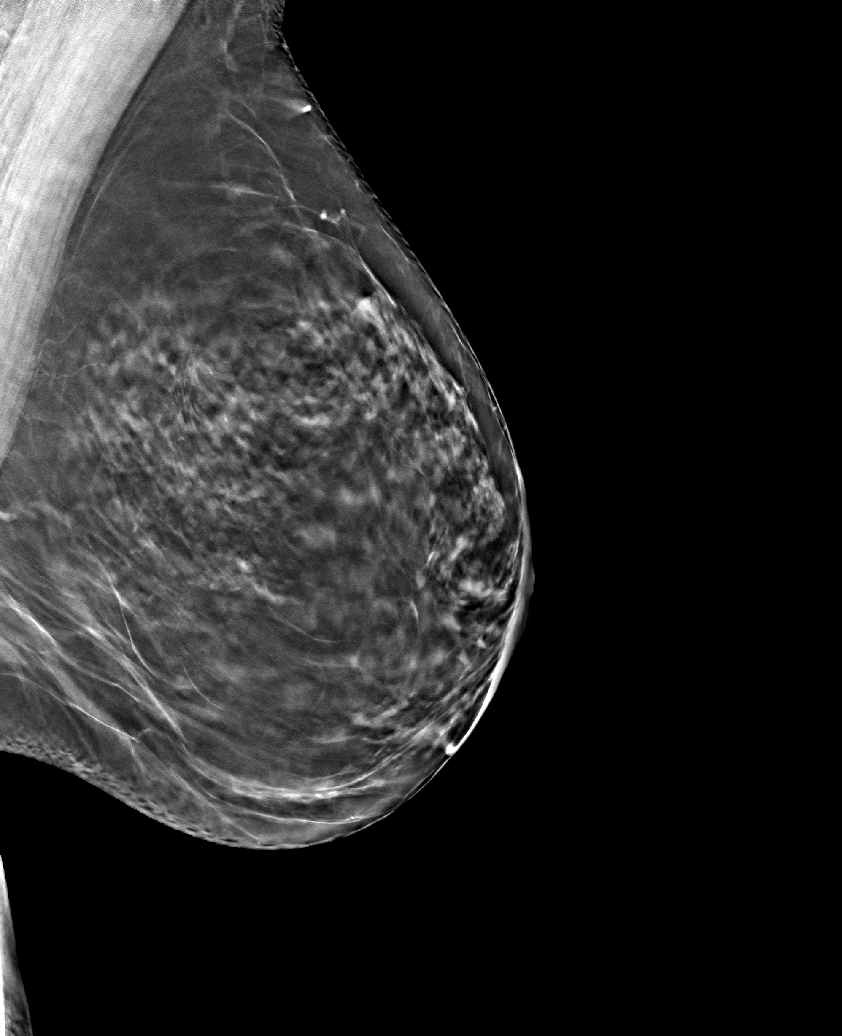

[L CC tomo · tomo slice 35/70.0]
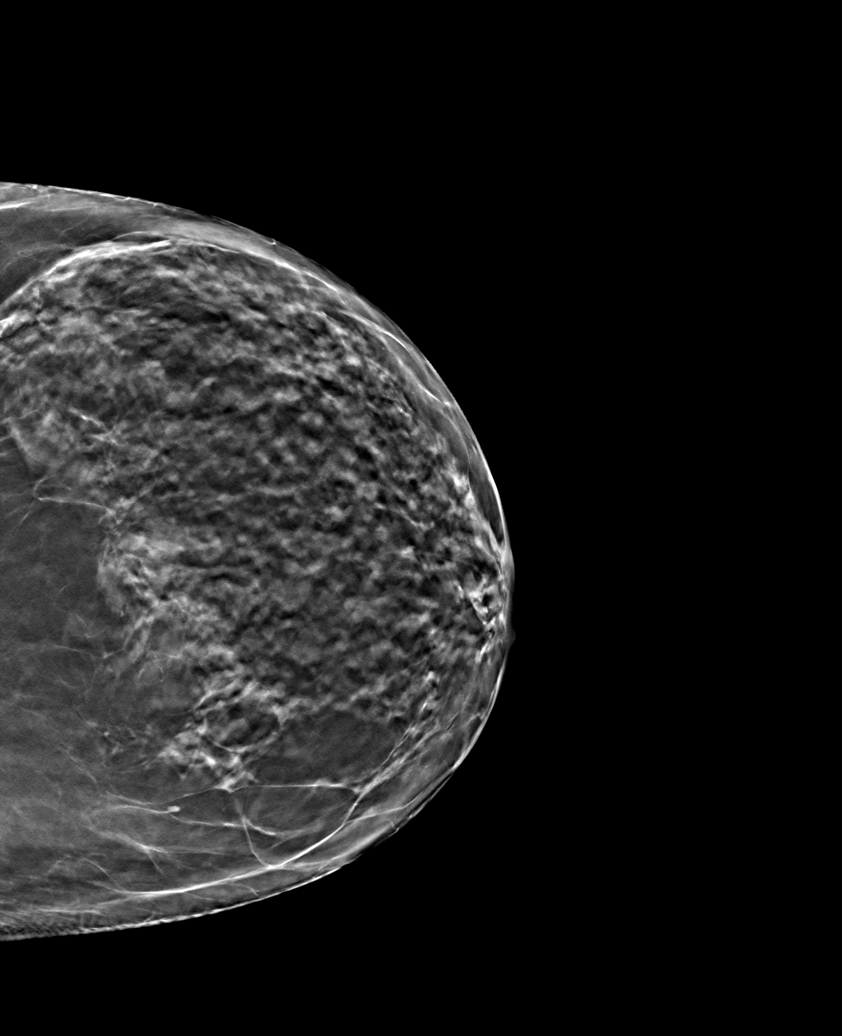

[6 of 14 positions shown; findings below may reference images not displayed]

ACR Breast Density Category c: The breast tissue is heterogeneously
dense, which may obscure small masses.
FINDINGS: On today's additional views of the left breast with 3D
tomosynthesis, there is no persistent abnormality in the upper left
breast. Overall fibroglandular pattern is stable compared to earlier
mammograms of [DATE] and [DATE]. There are no dominant
masses, suspicious calcifications or secondary signs of malignancy
identified within the left breast on today's exam.

Mammographic images were processed with CAD.
IMPRESSION: No evidence of malignancy.

Patient may return to routine annual bilateral screening mammogram
schedule.

RECOMMENDATION:
Screening mammogram in one year.(Code:[9I])

I have discussed the findings and recommendations with the patient.
Results were also provided in writing at the conclusion of the
visit. If applicable, a reminder letter will be sent to the patient
regarding the next appointment.

BI-RADS CATEGORY  1: Negative.

## 2016-05-20 ENCOUNTER — Encounter (HOSPITAL_COMMUNITY): Payer: Self-pay | Admitting: Emergency Medicine

## 2016-05-20 ENCOUNTER — Emergency Department (HOSPITAL_COMMUNITY): Payer: BC Managed Care – PPO

## 2016-05-20 ENCOUNTER — Emergency Department (HOSPITAL_COMMUNITY)
Admission: EM | Admit: 2016-05-20 | Discharge: 2016-05-20 | Disposition: A | Discharge status: Home / Self Care | Payer: BC Managed Care – PPO | Attending: Emergency Medicine | Admitting: Emergency Medicine

## 2016-05-20 DIAGNOSIS — Z7982 Long term (current) use of aspirin: Secondary | ICD-10-CM | POA: Diagnosis not present

## 2016-05-20 DIAGNOSIS — R002 Palpitations: Secondary | ICD-10-CM | POA: Diagnosis present

## 2016-05-20 DIAGNOSIS — F329 Major depressive disorder, single episode, unspecified: Secondary | ICD-10-CM | POA: Diagnosis not present

## 2016-05-20 DIAGNOSIS — F17210 Nicotine dependence, cigarettes, uncomplicated: Secondary | ICD-10-CM | POA: Insufficient documentation

## 2016-05-20 LAB — CBC WITH DIFFERENTIAL/PLATELET
Basophils Absolute: 0 10*3/uL (ref 0.0–0.1)
Basophils Relative: 0 %
Eosinophils Absolute: 0.2 10*3/uL (ref 0.0–0.7)
Eosinophils Relative: 2 %
HCT: 39.1 % (ref 36.0–46.0)
Hemoglobin: 12.8 g/dL (ref 12.0–15.0)
Lymphocytes Relative: 27 %
Lymphs Abs: 2.4 10*3/uL (ref 0.7–4.0)
MCH: 28.6 pg (ref 26.0–34.0)
MCHC: 32.7 g/dL (ref 30.0–36.0)
MCV: 87.5 fL (ref 78.0–100.0)
Monocytes Absolute: 0.7 10*3/uL (ref 0.1–1.0)
Monocytes Relative: 7 %
Neutro Abs: 5.6 10*3/uL (ref 1.7–7.7)
Neutrophils Relative %: 64 %
Platelets: 281 10*3/uL (ref 150–400)
RBC: 4.47 MIL/uL (ref 3.87–5.11)
RDW: 14.6 % (ref 11.5–15.5)
WBC: 8.8 10*3/uL (ref 4.0–10.5)

## 2016-05-20 LAB — COMPREHENSIVE METABOLIC PANEL
ALT: 12 U/L — ABNORMAL LOW (ref 14–54)
AST: 16 U/L (ref 15–41)
Albumin: 4.2 g/dL (ref 3.5–5.0)
Alkaline Phosphatase: 60 U/L (ref 38–126)
Anion gap: 7 (ref 5–15)
BUN: 5 mg/dL — ABNORMAL LOW (ref 6–20)
CO2: 27 mmol/L (ref 22–32)
Calcium: 8.8 mg/dL — ABNORMAL LOW (ref 8.9–10.3)
Chloride: 104 mmol/L (ref 101–111)
Creatinine, Ser: 0.57 mg/dL (ref 0.44–1.00)
GFR calc Af Amer: >60 mL/min (ref >60)
GFR calc non Af Amer: >60 mL/min (ref >60)
Glucose, Bld: 85 mg/dL (ref 65–99)
Potassium: 3.2 mmol/L — ABNORMAL LOW (ref 3.5–5.1)
Sodium: 138 mmol/L (ref 135–145)
Total Bilirubin: 0.3 mg/dL (ref 0.3–1.2)
Total Protein: 7.3 g/dL (ref 6.5–8.1)

## 2016-05-20 LAB — TROPONIN I

## 2016-05-20 IMAGING — DX DG CHEST 2V
2 series · 2 of 2 positions shown · non-contrast
Comparison: [DATE]

CLINICAL DATA: short of breath

EXAM:
CHEST  2 VIEW

[chest pa]
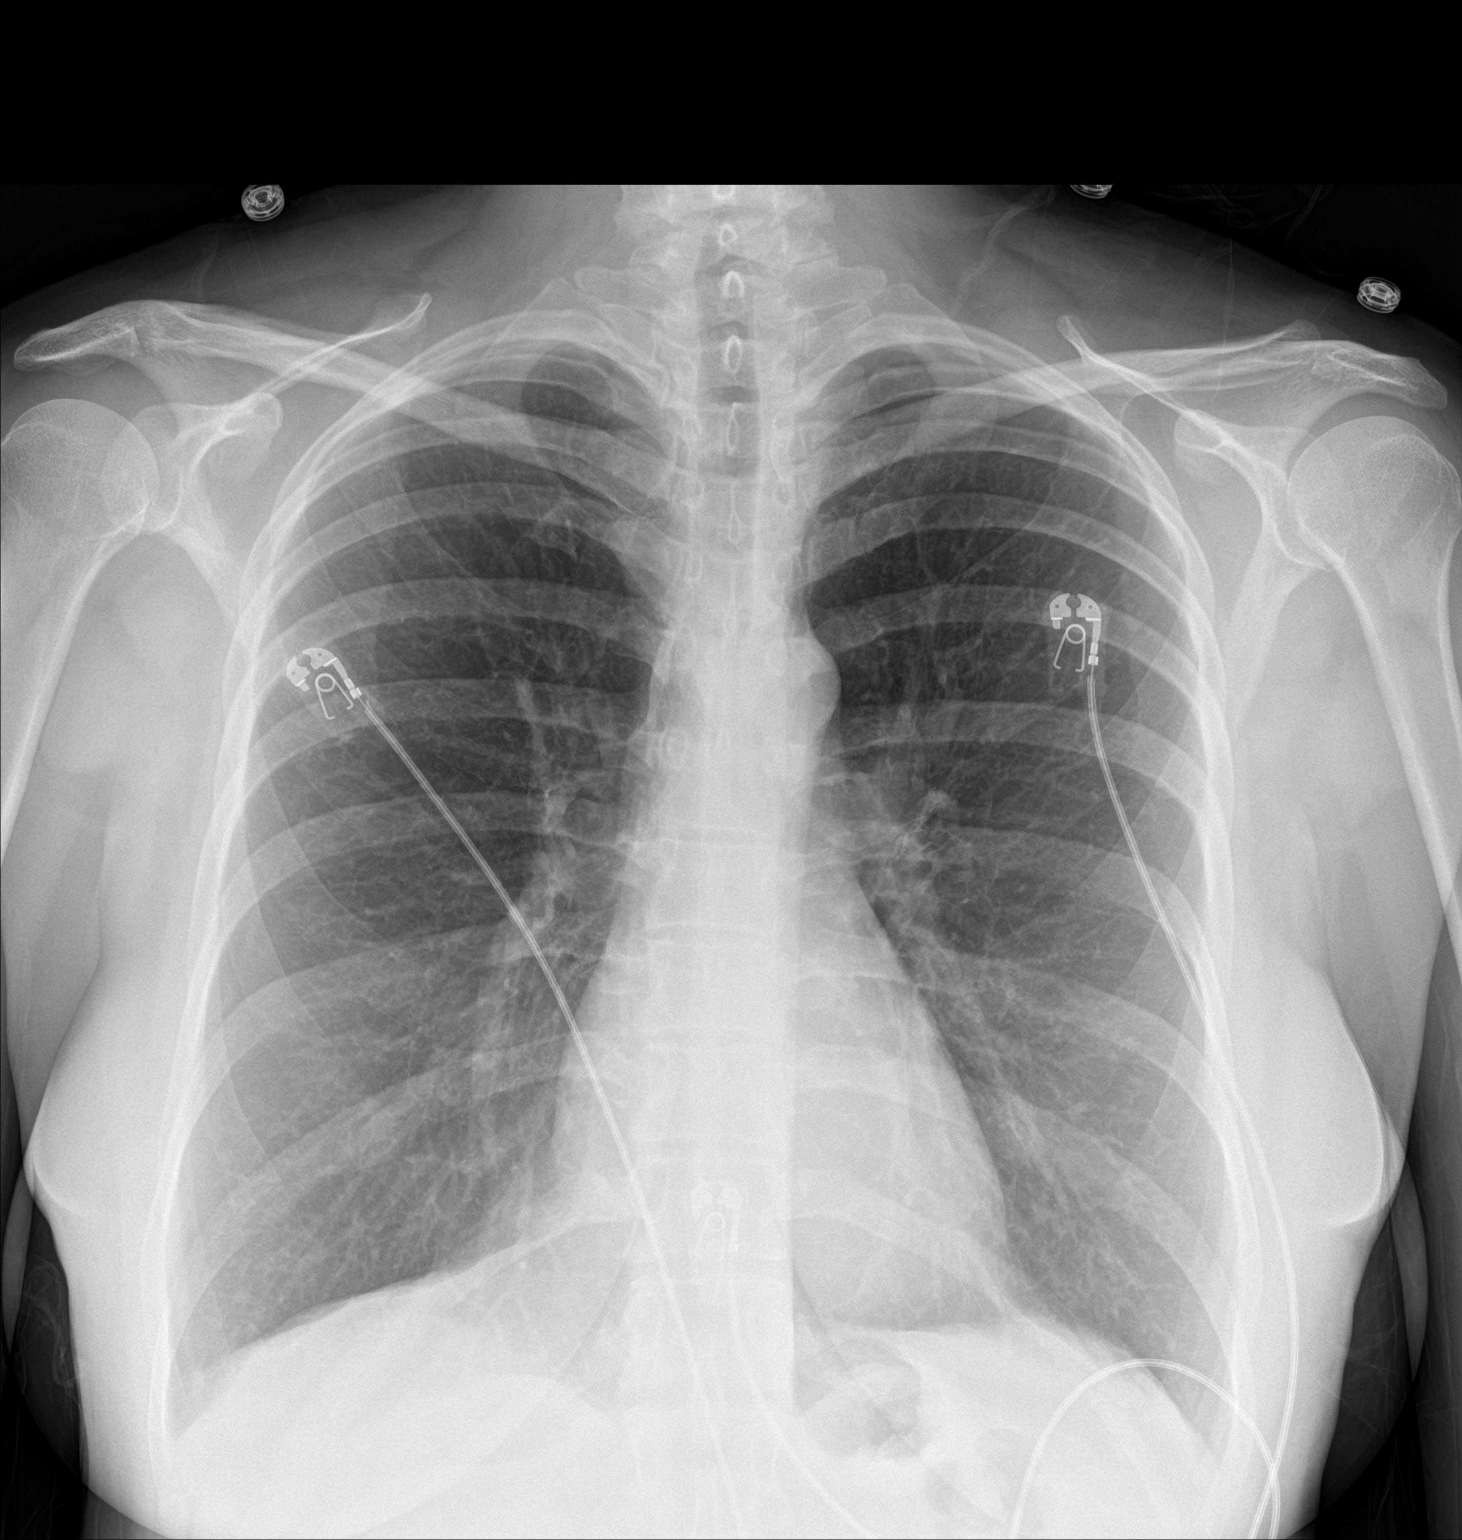

[chest lat]
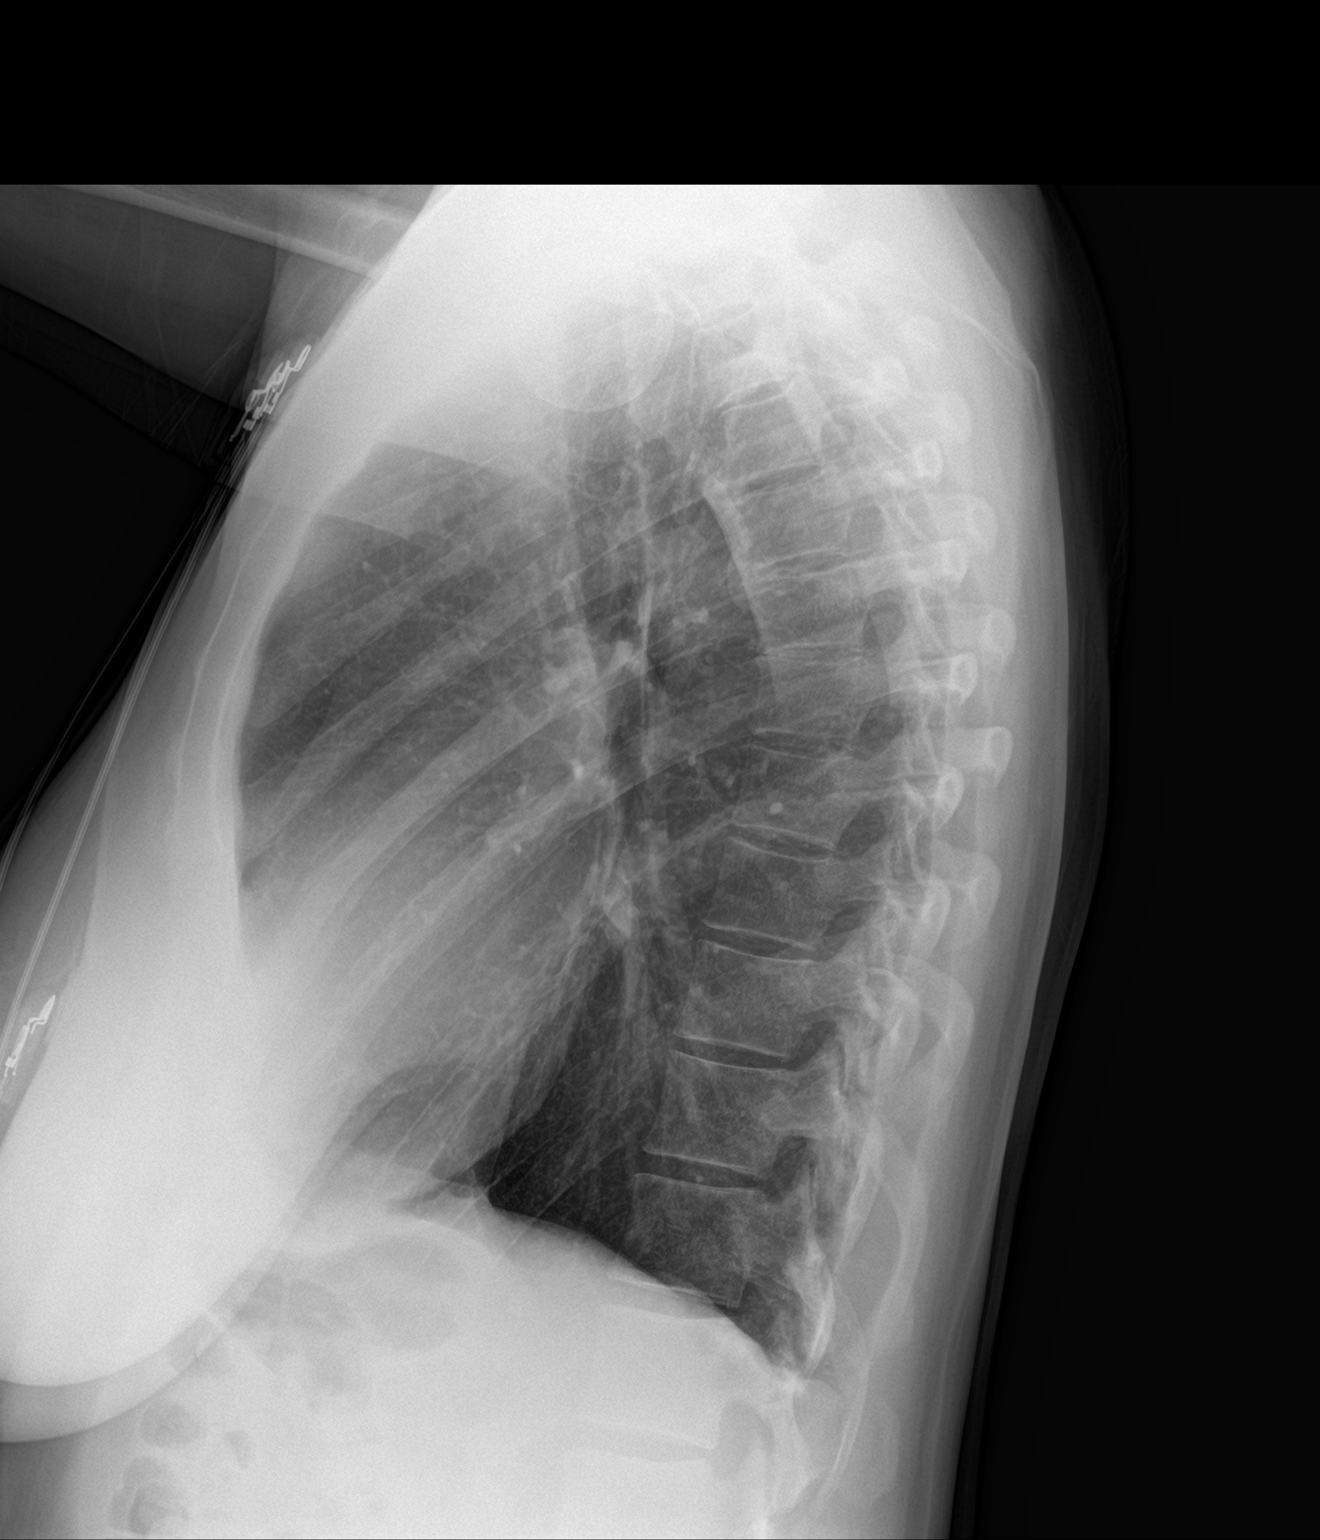

[2 of 2 positions shown; findings below may reference images not displayed]

FINDINGS: Normal mediastinum and cardiac silhouette. Normal pulmonary
vasculature. No evidence of effusion, infiltrate, or pneumothorax.
No acute bony abnormality.
IMPRESSION: No acute cardiopulmonary process.

## 2016-05-20 MED ORDER — POTASSIUM CHLORIDE CRYS ER 20 MEQ PO TBCR
40.0000 meq | EXTENDED_RELEASE_TABLET | Freq: Once | ORAL | Status: AC
  Administered 2016-05-20: 40 meq via ORAL
  Filled 2016-05-20: qty 2

## 2016-05-20 NOTE — Discharge Instructions (Signed)

## 2016-05-20 NOTE — ED Notes (Signed)
Patient c/o irregular heart beat x3-4 days. Per patient became more severe while in church today with sitting and standing. Per patient shortness of breath and dizziness. Denies any chest pain.

## 2016-05-20 NOTE — ED Provider Notes (Signed)
History  By signing my name below, I, Cheryl Cross, attest that this documentation has been prepared under the direction and in the presence of Cheryl Fraise, MD. Electronically Signed: Bea Cross, ED Scribe. 05/20/2016. 2:02 PM.  Chief Complaint  Patient presents with  . Irregular Heart Beat   Patient is a 50 y.o. female presenting with palpitations. The history is provided by the patient and medical records. No language interpreter was used.  Palpitations Palpitations quality:  Irregular Onset quality:  Unable to specify Duration:  1 week Timing:  Intermittent Progression:  Unchanged Chronicity:  New Context: anxiety and caffeine   Relieved by:  None tried Worsened by:  Nothing Ineffective treatments:  None tried Associated symptoms: dizziness and shortness of breath   Associated symptoms: no back pain, no chest pain, no chest pressure, no cough, no leg pain, no lower extremity edema, no malaise/fatigue, no nausea and no vomiting   Risk factors: no hx of atrial fibrillation, no hx of DVT and no hx of PE     HPI Comments:  Cheryl Cross is a 49 y.o. female who presents to the Emergency Department complaining of palpitations that began about 1 week ago. She describes the symptoms feel as if her heart is skipping. She reports some dizziness and SOB (while singing) earlier today. She reports drinking lots of caffeine normally (about a 2 liter per day of Pepsi). She has not done anything to treat her symptoms. She denies modifying factors. She denies CP, fever, chills, nausea, vomiting, diarrhea, back pain, abdominal pain, hemoptysis. Pt is a smoker. She denies any personal h/o cardiac problems. She denies any h/o DVT or PE. She denies any illicit drug use. She denies any new medications. PMHx of fibromyalgia, polycythemia vera. PCP is Cheryl Cross.   Past Medical History  Diagnosis Date  . Fibromyalgia   . Bursitis     hips bilat  . Arthritis     back   . DDD  (degenerative disc disease)   . Overactive bladder   . Depression   . PTSD (post-traumatic stress disorder)   . Anxiety   . Obsessive-compulsive disorder   . Polycythemia vera(238.4) 12/07/2011  . Sesamoiditis February 2017   Past Surgical History  Procedure Laterality Date  . Abdominal hysterectomy    . Lumbar fusion    . Esophagogastroduodenoscopy N/A 09/10/2014    Procedure: ESOPHAGOGASTRODUODENOSCOPY (EGD);  Surgeon: Cheryl Houston, MD;  Location: [facility];  Service: Endoscopy;  Laterality: N/A;  130 - moved to 3:15 - Ann to notify  . Tooth extraction Left aug 2016   Family History  Problem Relation Age of Onset  . Cancer - Colon Mother     age 65  . Anxiety disorder Mother   . Bipolar disorder Neg Hx   . Dementia Neg Hx   . Drug abuse Neg Hx   . Paranoid behavior Neg Hx   . Schizophrenia Neg Hx   . Seizures Neg Hx   . Sexual abuse Neg Hx   . Physical abuse Neg Hx    Social History  Substance Use Topics  . Smoking status: Current Every Day Smoker -- 1.00 packs/day for 15 years    Types: Cigarettes  . Smokeless tobacco: Never Used     Comment: 1 pack a day x 20 yrs.  . Alcohol Use: No   OB History    Gravida Para Term Preterm AB TAB SAB Ectopic Multiple Living  0     Review of Systems  Constitutional: Negative for fever, chills and malaise/fatigue.  Respiratory: Positive for shortness of breath. Negative for cough.   Cardiovascular: Positive for palpitations. Negative for chest pain.  Gastrointestinal: Negative for nausea, vomiting, abdominal pain and diarrhea.  Musculoskeletal: Negative for back pain.  Neurological: Positive for dizziness.  All other systems reviewed and are negative.   Allergies  Cyclobenzaprine; Diclofenac sodium; Gabapentin; Penicillins; Robaxin; and Voltaren  Home Medications   Prior to Admission medications   Medication Sig Start Date End Date Taking? Authorizing Provider  aspirin 81 MG tablet Take 1 tablet (81 mg  total) by mouth daily. For Polycythemia Vera. 03/15/12   Cheryl Castilla, FNP  ketorolac (ACULAR) 0.5 % ophthalmic solution Place 1 drop into both eyes daily as needed. 02/09/15   Historical Provider, MD  sertraline (ZOLOFT) 100 MG tablet Take 1.5 tablets (150 mg total) by mouth daily. 03/23/16   Cheryl Spring, MD  traZODone (DESYREL) 50 MG tablet Take 1 tablet (50 mg total) by mouth at bedtime. For sleep. 03/23/16 03/05/17  Cheryl Spring, MD   Triage Vitals: BP 112/78 mmHg  Pulse 84  Temp(Src) 98.4 F (36.9 C) (Oral)  Resp 18  Ht 5\' 7"  (1.702 m)  Wt 160 lb (72.576 kg)  BMI 25.05 kg/m2  SpO2 99%  Physical Exam  CONSTITUTIONAL: Well developed/well nourished HEAD: Normocephalic/atraumatic EYES: EOMI/PERRL ENMT: Mucous membranes moist NECK: supple no meningeal signs SPINE/BACK:entire spine nontender CV: S1/S2 noted, no murmurs/rubs/gallops noted; rare irregular beat noted LUNGS: Lungs are clear to auscultation bilaterally, no apparent distress ABDOMEN: soft, nontender, no rebound or guarding, bowel sounds noted throughout abdomen GU:no cva tenderness NEURO: Pt is awake/alert/appropriate, moves all extremitiesx4.  No facial droop.   EXTREMITIES: pulses normal/equal, full ROM, no calf tenderness or edema SKIN: warm, color normal PSYCH: no abnormalities of mood noted, alert and oriented to situation   ED Course  Procedures  DIAGNOSTIC STUDIES: Oxygen Saturation is 99% on RA, normal by my interpretation.   COORDINATION OF CARE: 1:57 PM- Will wait for lab work to result and order CXR. Pt verbalizes understanding and agrees to plan.  2:47 PM Mild hypokalemia, otherwise labs/imaging negative EKG shows P-SVC No other acute abnormalities Advised to cut back on smoking/caffeine Referred to PCP and cardiology, she would benefit from holter monitoring Discussed strict return precautions I doubt ACS/PE at this time  Medications  potassium chloride SA (K-DUR,KLOR-CON) CR tablet 40 mEq  (not administered)    Labs Review Labs Reviewed  COMPREHENSIVE METABOLIC PANEL - Abnormal; Notable for the following:    Potassium 3.2 (*)    BUN 5 (*)    Calcium 8.8 (*)    ALT 12 (*)    All other components within normal limits  CBC WITH DIFFERENTIAL/PLATELET  TROPONIN I    Imaging Review Dg Chest 2 View  05/20/2016  CLINICAL DATA:  short of breath EXAM: CHEST  2 VIEW COMPARISON:  08/31/2014 FINDINGS: Normal mediastinum and cardiac silhouette. Normal pulmonary vasculature. No evidence of effusion, infiltrate, or pneumothorax. No acute bony abnormality. IMPRESSION: No acute cardiopulmonary process. Electronically Signed   By: Suzy Bouchard M.D.   On: 05/20/2016 14:30   I have personally reviewed and evaluated these images and lab results as part of my medical decision-making.   EKG Interpretation   Date/Time:  Sunday May 20 2016 13:21:11 EDT Ventricular Rate:  80 PR Interval:  134 QRS Duration: 90 QT Interval:  374 QTC Calculation: 431 R  Axis:   69 Text Interpretation:  Sinus rhythm with Premature supraventricular  complexes Nonspecific ST abnormality Abnormal ECG Confirmed by Christy Gentles   MD, Elenore Rota ([id number]) on 05/20/2016 1:37:27 PM      MDM   Final diagnoses:  Palpitations    Nursing notes including past medical history and social history reviewed and considered in documentation xrays/imaging reviewed by myself and considered during evaluation Labs/vital reviewed myself and considered during evaluation   I personally performed the services described in this documentation, which was scribed in my presence. The recorded information has been reviewed and is accurate.       Cheryl Fraise, MD 05/20/16 [phone]

## 2016-05-21 ENCOUNTER — Other Ambulatory Visit (INDEPENDENT_AMBULATORY_CARE_PROVIDER_SITE_OTHER): Payer: BC Managed Care – PPO

## 2016-05-21 DIAGNOSIS — D45 Polycythemia vera: Secondary | ICD-10-CM | POA: Diagnosis not present

## 2016-05-22 LAB — COMPREHENSIVE METABOLIC PANEL
A/G RATIO: 2 (ref 1.2–2.2)
ALBUMIN: 4.2 g/dL (ref 3.5–5.5)
ALT: 9 IU/L (ref 0–32)
AST: 13 IU/L (ref 0–40)
Alkaline Phosphatase: 60 IU/L (ref 39–117)
BUN / CREAT RATIO: 9 (ref 9–23)
BUN: 6 mg/dL (ref 6–24)
CALCIUM: 8.8 mg/dL (ref 8.7–10.2)
CHLORIDE: 100 mmol/L (ref 96–106)
CO2: 21 mmol/L (ref 18–29)
Creatinine, Ser: 0.67 mg/dL (ref 0.57–1.00)
GFR, EST AFRICAN AMERICAN: 120 mL/min/{1.73_m2} (ref >59)
GFR, EST NON AFRICAN AMERICAN: 104 mL/min/{1.73_m2} (ref >59)
GLOBULIN, TOTAL: 2.1 g/dL (ref 1.5–4.5)
Glucose: 79 mg/dL (ref 65–99)
POTASSIUM: 4.1 mmol/L (ref 3.5–5.2)
SODIUM: 138 mmol/L (ref 134–144)
TOTAL PROTEIN: 6.3 g/dL (ref 6.0–8.5)

## 2016-05-22 LAB — CBC WITH DIFFERENTIAL/PLATELET
BASOS: 0 %
Basophils Absolute: 0 10*3/uL (ref 0.0–0.2)
EOS (ABSOLUTE): 0.1 10*3/uL (ref 0.0–0.4)
EOS: 2 %
HEMATOCRIT: 37.9 % (ref 34.0–46.6)
HEMOGLOBIN: 12.1 g/dL (ref 11.1–15.9)
IMMATURE GRANS (ABS): 0 10*3/uL (ref 0.0–0.1)
Immature Granulocytes: 0 %
LYMPHS: 24 %
Lymphocytes Absolute: 1.7 10*3/uL (ref 0.7–3.1)
MCH: 27.6 pg (ref 26.6–33.0)
MCHC: 31.9 g/dL (ref 31.5–35.7)
MCV: 87 fL (ref 79–97)
Monocytes Absolute: 0.6 10*3/uL (ref 0.1–0.9)
Monocytes: 8 %
NEUTROS ABS: 4.6 10*3/uL (ref 1.4–7.0)
Neutrophils: 66 %
PLATELETS: 303 10*3/uL (ref 150–379)
RBC: 4.38 x10E6/uL (ref 3.77–5.28)
RDW: 15.8 % — ABNORMAL HIGH (ref 12.3–15.4)
WBC: 7 10*3/uL (ref 3.4–10.8)

## 2016-05-22 LAB — FERRITIN: Ferritin: 9 ng/mL — ABNORMAL LOW (ref 15–150)

## 2016-05-23 ENCOUNTER — Telehealth: Payer: Self-pay | Admitting: *Deleted

## 2016-05-23 NOTE — Telephone Encounter (Signed)
Pt called / informed labs are good per Dr Beryle Beams. Stated she will see [place] Monday.

## 2016-05-23 NOTE — Telephone Encounter (Signed)
-----   Message from Annia Belt, MD sent at 05/22/2016  9:42 AM EDT ----- Call pt lab good

## 2016-05-24 ENCOUNTER — Ambulatory Visit (INDEPENDENT_AMBULATORY_CARE_PROVIDER_SITE_OTHER): Payer: BC Managed Care – PPO | Admitting: Cardiology

## 2016-05-24 ENCOUNTER — Encounter: Payer: Self-pay | Admitting: Cardiology

## 2016-05-24 VITALS — BP 114/60 | HR 98 | Ht 67.0 in | Wt 153.0 lb

## 2016-05-24 DIAGNOSIS — I493 Ventricular premature depolarization: Secondary | ICD-10-CM

## 2016-05-24 DIAGNOSIS — R002 Palpitations: Secondary | ICD-10-CM

## 2016-05-24 DIAGNOSIS — E7800 Pure hypercholesterolemia, unspecified: Secondary | ICD-10-CM | POA: Diagnosis not present

## 2016-05-24 DIAGNOSIS — R0602 Shortness of breath: Secondary | ICD-10-CM | POA: Diagnosis not present

## 2016-05-24 NOTE — Progress Notes (Signed)
Cardiology Office Note  Date: 05/24/2016   ID: Cheryl Cross, DOB 10-05-1966, MRN [id number]  PCP: Glo Herring., MD  Referring provider: [name], MD Consulting Cardiologist: Rozann Lesches, MD   Chief Complaint  Patient presents with  . Palpitations    History of Present Illness: Cheryl Cross is a 50 y.o. female referred for cardiology consultation by Dr. Christy Gentles after recent ER evaluation for palpitations. She is here today with significant other. She states that she has felt a sense of "skipping" in her heartbeat over the last few weeks. Sometimes feels a sense of breathlessness when this occurs, otherwise no unusual dizziness or syncope. She has had no chest pain. She does admit that she drinks a significant amount of caffeinated beverages. No other obvious stimulant medications. She has no known history of structural heart disease. She does report a history of atrial fibrillation in her father and her brother.  I reviewed her recent ECG and chest x-ray, outlined below.  She follows with Dr. Gerarda Fraction for primary care. Lab work done in June showed a potassium of 3.9, BUN 5, creatinine 0.7. TSH was 1.4. Cholesterol was elevated at that time at 232 with triglycerides 264, HDL 37, and LDL 142. Back in 2013 her LDL was 97.  Past Medical History  Diagnosis Date  . Fibromyalgia   . Bursitis   . Arthritis   . DDD (degenerative disc disease)   . Overactive bladder   . Depression   . PTSD (post-traumatic stress disorder)   . Anxiety   . Obsessive-compulsive disorder   . 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 Sesamoiditis February 2017    Past Surgical History  Procedure Laterality Date  . Abdominal hysterectomy    . Lumbar fusion    . Esophagogastroduodenoscopy N/A 09/10/2014    Procedure: ESOPHAGOGASTRODUODENOSCOPY (EGD);  Surgeon: Rogene Houston, MD;  Location: [facility];  Service: Endoscopy;  Laterality: N/A;  130 - moved to 3:15 - Ann to notify   . Tooth extraction Left Aug 2016    Current Outpatient Prescriptions  Medication Sig Dispense Refill  . aspirin 81 MG tablet Take 1 tablet (81 mg total) by mouth daily. For Polycythemia Vera.    Marland Kitchen ketorolac (ACULAR) 0.5 % ophthalmic solution Place 1 drop into both eyes daily as needed.    . sertraline (ZOLOFT) 100 MG tablet Take 1.5 tablets (150 mg total) by mouth daily. 45 tablet 3  . traZODone (DESYREL) 50 MG tablet Take 1 tablet (50 mg total) by mouth at bedtime. For sleep. 30 tablet 3   No current facility-administered medications for this visit.   Allergies:  Cyclobenzaprine; Diclofenac sodium; Gabapentin; Penicillins; Robaxin; and Voltaren   Social History: The patient  reports that she has been smoking Cigarettes.  She started smoking about 19 years ago. She has a 15 pack-year smoking history. She has never used smokeless tobacco. She reports that she does not drink alcohol or use illicit drugs.   Family History: The patient's family history includes Anxiety disorder in her mother; Atrial fibrillation in her brother and father; Cancer - Colon in her mother. There is no history of Bipolar disorder, Dementia, Drug abuse, Paranoid behavior, Schizophrenia, Seizures, Sexual abuse, or Physical abuse.   ROS:  Please see the history of present illness. Otherwise, complete review of systems is positive for anxiety.  All other systems are reviewed and negative.   Physical Exam: VS:  BP 114/60 mmHg  Pulse 98  Ht 5\' 7"  (1.702 m)  Wt 153 lb (69.4 kg)  BMI 23.96 kg/m2  SpO2 99%, BMI Body mass index is 23.96 kg/(m^2).  Wt Readings from Last 3 Encounters:  05/24/16 153 lb (69.4 kg)  05/20/16 160 lb (72.576 kg)  03/23/16 154 lb 12.8 oz (70.217 kg)    General: Patient appears comfortable at rest. HEENT: Conjunctiva and lids normal, oropharynx clear. Neck: Supple, no elevated JVP or carotid bruits, no thyromegaly. Lungs: Clear to auscultation, nonlabored breathing at rest. Cardiac: Regular  rate and rhythm with single ectopic beat, no S3 or significant systolic murmur, no pericardial rub. Abdomen: Soft, nontender, bowel sounds present, no guarding or rebound. Extremities: No pitting edema, distal pulses 2+. Skin: Warm and dry. Musculoskeletal: No kyphosis. Neuropsychiatric: Alert and oriented x3, affect grossly appropriate.  ECG: I personally reviewed the tracing from7/07/2016 which showed sinus rhythm with PVC and nonspecific ST changes.  Recent Labwork: 05/20/2016: Hemoglobin 12.8 05/21/2016: ALT 9; AST 13; BUN 6; Creatinine, Ser 0.67; Platelets 303; Potassium 4.1; Sodium 138   Other Studies Reviewed Today:  Chest x-ray 05/20/2016: FINDINGS: Normal mediastinum and cardiac silhouette. Normal pulmonary vasculature. No evidence of effusion, infiltrate, or pneumothorax. No acute bony abnormality.  IMPRESSION: No acute cardiopulmonary process.  Assessment and Plan:  1. Palpitations most likely due to PVCs based on workup and findings so far. She has had no sudden dizziness or syncope, no chest pain. Recent TSH normal. We did discuss her caffeine use and I have recommended that she cut back to see if this is helpful. She does not report any major improvement with repletion of potassium at ER visit. Her brother and father both have history of atrial fibrillation. Plan at this time is to obtain an echocardiogram to exclude any cardiac structural abnormalities and also a 24-hour Holter monitor to better quantify her PVCs and exclude any other arrhythmias.  2. Elevated cholesterol based on blood work per PCP in June. LDL looked better in the past. I asked her to take a careful look at her diet and also exercise plan to see if anything may need to be modified to improve her numbers. She should keep follow-up with Dr. Gerarda Fraction.  3. History of anxiety and OCD. She follows with Behavioral Health.  Current medicines were reviewed with the patient today.   Orders Placed This Encounter    Procedures  . Holter monitor - 24 hour  . ECHOCARDIOGRAM COMPLETE    Disposition: Call with results.  Signed, Satira Sark, MD, [facility] 05/24/2016 8:59 AM    [facility] [address] [place], [place] [address] Phone: [phone]; Fax: [phone]

## 2016-05-24 NOTE — Patient Instructions (Signed)
Your physician recommends that you schedule a follow-up appointment in: to be determined  Your physician has requested that you have an echocardiogram. Echocardiography is a painless test that uses sound waves to create images of your heart. It provides your doctor with information about the size and shape of your heart and how well your heart's chambers and valves are working. This procedure takes approximately one hour. There are no restrictions for this procedure.     Your physician has recommended that you wear a holter monitor. Holter monitors are medical devices that record the heart's electrical activity. Doctors most often use these monitors to diagnose arrhythmias. Arrhythmias are problems with the speed or rhythm of the heartbeat. The monitor is a small, portable device. You can wear one while you do your normal daily activities. This is usually used to diagnose what is causing palpitations/syncope (passing out).      Thank you for choosing [facility] !

## 2016-05-28 ENCOUNTER — Ambulatory Visit (INDEPENDENT_AMBULATORY_CARE_PROVIDER_SITE_OTHER): Payer: BC Managed Care – PPO | Admitting: Oncology

## 2016-05-28 ENCOUNTER — Encounter: Payer: Self-pay | Admitting: Oncology

## 2016-05-28 VITALS — BP 126/65 | HR 72 | Temp 98.0°F | Ht 67.0 in | Wt 155.9 lb

## 2016-05-28 DIAGNOSIS — F329 Major depressive disorder, single episode, unspecified: Secondary | ICD-10-CM

## 2016-05-28 DIAGNOSIS — R002 Palpitations: Secondary | ICD-10-CM

## 2016-05-28 DIAGNOSIS — F17210 Nicotine dependence, cigarettes, uncomplicated: Secondary | ICD-10-CM

## 2016-05-28 DIAGNOSIS — M797 Fibromyalgia: Secondary | ICD-10-CM

## 2016-05-28 DIAGNOSIS — D45 Polycythemia vera: Secondary | ICD-10-CM

## 2016-05-28 NOTE — Patient Instructions (Signed)
To lab today Continue phlebotomy every 4 months at [facility] short stay unit MD visit 1 year Lab 1 week before visit

## 2016-05-28 NOTE — Progress Notes (Signed)
Patient ID: Cheryl Cross, female   DOB: 04/11/1966, 50 y.o.   MRN: [id number] Hematology and Oncology Follow Up Visit  Cheryl Cross [id number] September 11, 1966 50 y.o. 05/28/2016 3:50 PM   Principle Diagnosis: Encounter Diagnoses  Name Primary?  . Polycythemia vera ([place]) Yes  . 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 Extensive evaluation in the past to exclude other causes of polycythemia was unremarkable. Please see my summary note dated 06/14/2013 for additional details. She had persistent and progressive elevation of her hemoglobin associated with constitutional symptoms and was therefore started on a phlebotomy program. She had prompt symptom relief. She currently has a phlebotomy  every 4 months. .   Interim History:   Overall doing well with no major interim medical problems. She did have a recent evaluation for palpitations. These have been occurring more frequently almost on a daily basis. They last for a few minutes. She went to the emergency department for an evaluation on July 9. Pulse was 84 and regular. Blood pressure 112/78. Oxygen saturation 99% on room air. No cardiac murmurs or gallops noted. Rare irregular beat on cardiac exam. EKG with sinus rhythm. Occasional premature ventricular complex. Normal chest x-ray. Potassium was 3.2. She was given oral potassium in the emergency department. Advised to cut back on smoking and caffeine. Referred to cardiology. She saw Dr. Domenic Polite last week on July 13. He reviewed lab done in June by her primary care physician which included a normal TSH normal at 1.4. Cholesterol 232, triglycerides 264, LDL 142. An echocardiogram was scheduled to exclude any cardiac structural abnormalities. 24-hour Holter monitor. Her chronic depression remains under control and she continues to follow-up with behavioral health. She has not been able to stop smoking and in fact does not have any desire to do  so. She was concerned about her cholesterol and triglycerides. I told her I was more concerned with her cigarette smoking as a risk factor for her heart.   She was having some problems with one of the bones in her right foot and had to wear a boot for a few months.  She had recent mammograms on June 30 and was called back for spot views and these turned out okay  She is still teaching at [employer]. School  just finished and she is glad to get a break for the summer.  Medications: reviewed  Allergies:  Allergies  Allergen Reactions  . Cyclobenzaprine Other (See Comments)    Hot sensation  . Diclofenac Sodium Itching  . Gabapentin Hives  . Penicillins Other (See Comments)    Unknown child hood reaction  . Robaxin [Methocarbamol] Hives  . Voltaren [Diclofenac Sodium] Itching    Review of Systems: See interim history Remaining ROS negative:   Physical Exam: Blood pressure 126/65, pulse 72, temperature 98 F (36.7 C), temperature source Oral, height 5\' 7"  (1.702 m), weight 155 lb 14.4 oz (70.716 kg), SpO2 100 %. Wt Readings from Last 3 Encounters:  05/28/16 155 lb 14.4 oz (70.716 kg)  05/24/16 153 lb (69.4 kg)  05/20/16 160 lb (72.576 kg)     General appearance: Well-nourished Caucasian woman HENNT: Pharynx no erythema, exudate, mass, or ulcer. No thyromegaly or thyroid nodules Lymph nodes: No cervical, supraclavicular, or axillary lymphadenopathy Breasts:  Lungs: Clear to auscultation, resonant to percussion throughout Heart: Regular rhythm, no murmur, no gallop, no rub, no click, no edema Abdomen: Soft, nontender, normal bowel sounds, no mass, no organomegaly Extremities:  No edema, no calf tenderness Musculoskeletal: no joint deformities GU:  Vascular: Carotid pulses 2+, no bruits, distal pulses: Dorsalis pedis 1+ symmetric Neurologic: Alert, oriented, PERRLA, optic discs sharp and vessels normal, no hemorrhage or exudate, cranial nerves grossly normal,  motor strength 5 over 5, reflexes 1+ symmetric, upper body coordination normal, gait normal, Skin: No rash or ecchymosis  Lab Results: CBC W/Diff    Component Value Date/Time   WBC 7.0 05/21/2016 1015   WBC 8.8 05/20/2016 1341   WBC 8.3 06/05/2013 1327   RBC 4.38 05/21/2016 1015   RBC 4.47 05/20/2016 1341   RBC 4.31 06/05/2013 1327   HGB 12.8 05/20/2016 1341   HGB 12.8 06/05/2013 1327   HCT 37.9 05/21/2016 1015   HCT 39.1 05/20/2016 1341   HCT 38.6 06/05/2013 1327   PLT 303 05/21/2016 1015   PLT 281 05/20/2016 1341   PLT 317 06/05/2013 1327   MCV 87 05/21/2016 1015   MCV 87.5 05/20/2016 1341   MCV 89.6 06/05/2013 1327   MCH 27.6 05/21/2016 1015   MCH 28.6 05/20/2016 1341   MCH 29.8 06/05/2013 1327   MCHC 31.9 05/21/2016 1015   MCHC 32.7 05/20/2016 1341   MCHC 33.2 06/05/2013 1327   RDW 15.8* 05/21/2016 1015   RDW 14.6 05/20/2016 1341   RDW 14.4 06/05/2013 1327   LYMPHSABS 1.7 05/21/2016 1015   LYMPHSABS 2.4 05/20/2016 1341   LYMPHSABS 2.3 06/05/2013 1327   MONOABS 0.7 05/20/2016 1341   MONOABS 0.6 06/05/2013 1327   EOSABS 0.1 05/21/2016 1015   EOSABS 0.2 05/20/2016 1341   EOSABS 0.2 06/05/2013 1327   BASOSABS 0.0 05/21/2016 1015   BASOSABS 0.0 05/20/2016 1341   BASOSABS 0.1 06/05/2013 1327     Chemistry      Component Value Date/Time   NA 138 05/21/2016 1015   NA 138 05/20/2016 1341   NA 139 06/05/2013 1327   K 4.1 05/21/2016 1015   K 4.0 06/05/2013 1327   CL 100 05/21/2016 1015   CO2 21 05/21/2016 1015   CO2 26 06/05/2013 1327   BUN 6 05/21/2016 1015   BUN 5* 05/20/2016 1341   BUN 4.2* 06/05/2013 1327   CREATININE 0.67 05/21/2016 1015   CREATININE 0.57 05/31/2014 1000   CREATININE 0.8 06/05/2013 1327      Component Value Date/Time   CALCIUM 8.8 05/21/2016 1015   CALCIUM 9.2 06/05/2013 1327   ALKPHOS 60 05/21/2016 1015   ALKPHOS 63 06/05/2013 1327   AST 13 05/21/2016 1015   AST 7 06/05/2013 1327   ALT 9 05/21/2016 1015   ALT <6 Repeated and  Verified 06/05/2013 1327   BILITOT <0.2 05/21/2016 1015   BILITOT 0.3 05/20/2016 1341   BILITOT 0.29 06/05/2013 1327    Ferritin 9 on 05/21/2016   Radiological Studies: Dg Chest 2 View  05/20/2016  CLINICAL DATA:  short of breath EXAM: CHEST  2 VIEW COMPARISON:  08/31/2014 FINDINGS: Normal mediastinum and cardiac silhouette. Normal pulmonary vasculature. No evidence of effusion, infiltrate, or pneumothorax. No acute bony abnormality. IMPRESSION: No acute cardiopulmonary process. Electronically Signed   By: Suzy Bouchard M.D.   On: 05/20/2016 14:30   Mm Digital Screening Bilateral  05/04/2016  CLINICAL DATA:  Screening. EXAM: DIGITAL SCREENING BILATERAL MAMMOGRAM WITH CAD COMPARISON:  Previous exam(s). ACR Breast Density Category c: The breast tissue is heterogeneously dense, which may obscure small masses. FINDINGS: In the left breast, possible distortion warrants further evaluation. In the right breast, no findings suspicious for malignancy. Images  were processed with CAD. IMPRESSION: Further evaluation is suggested for possible distortion in the left breast. RECOMMENDATION: Diagnostic mammogram and possibly ultrasound of the left breast. (Code:[id number]-103M) The patient will be contacted regarding the findings, and additional imaging will be scheduled. BI-RADS CATEGORY  0: Incomplete. Need additional imaging evaluation and/or prior mammograms for comparison. Electronically Signed   By: Lillia Mountain M.D.   On: 05/04/2016 08:25   Mm Diag Breast Tomo Uni Left  05/11/2016  CLINICAL DATA:  Patient returns today to evaluate a possible left breast distortion identified on recent screening mammogram. EXAM: 2D DIGITAL DIAGNOSTIC UNILATERAL LEFT MAMMOGRAM WITH CAD AND ADJUNCT TOMO COMPARISON:  Previous exams including recent screening mammogram dated 04/30/2016. ACR Breast Density Category c: The breast tissue is heterogeneously dense, which may obscure small masses. FINDINGS: On today's additional views of the  left breast with 3D tomosynthesis, there is no persistent abnormality in the upper left breast. Overall fibroglandular pattern is stable compared to earlier mammograms of 12/01/2012 and 08/14/2011. There are no dominant masses, suspicious calcifications or secondary signs of malignancy identified within the left breast on today's exam. Mammographic images were processed with CAD. IMPRESSION: No evidence of malignancy. Patient may return to routine annual bilateral screening mammogram schedule. RECOMMENDATION: Screening mammogram in one year.(Code:SM-B-01Y) I have discussed the findings and recommendations with the patient. Results were also provided in writing at the conclusion of the visit. If applicable, a reminder letter will be sent to the patient regarding the next appointment. BI-RADS CATEGORY  1: Negative. Electronically Signed   By: Franki Cabot M.D.   On: 05/11/2016 13:38    Impression:  #1. Polycythemia vera  Stable on intermittent phlebotomy every 4 months at the [facility] short stay unit and low-dose aspirin Plan: continue the same. With consistently low ferritin levels and slowly falling hemoglobin, I may be able to increase the interval between phlebotomies down to every 6 months. She is scheduled for her next procedure on August 11. #2. Fibromyalgia syndrome  #3. Reactive depression secondary to #2. Controlled on current medications. #4. Erosive gastritis resolved with treatment  #5. Palpitations without chest pain or syncope currently under evaluation. See discussion above.  CC: Patient Care Team: Redmond School, MD as PCP - General (Internal Medicine)   Annia Belt, MD 7/17/20173:50 PM

## 2016-05-29 ENCOUNTER — Ambulatory Visit (HOSPITAL_COMMUNITY)
Admission: RE | Admit: 2016-05-29 | Discharge: 2016-05-29 | Disposition: A | Discharge status: Home / Self Care | Payer: BC Managed Care – PPO | Source: Ambulatory Visit | Attending: Cardiology | Admitting: Cardiology

## 2016-05-29 ENCOUNTER — Telehealth: Payer: Self-pay

## 2016-05-29 DIAGNOSIS — R0602 Shortness of breath: Secondary | ICD-10-CM | POA: Insufficient documentation

## 2016-05-29 DIAGNOSIS — I071 Rheumatic tricuspid insufficiency: Secondary | ICD-10-CM | POA: Insufficient documentation

## 2016-05-29 DIAGNOSIS — I340 Nonrheumatic mitral (valve) insufficiency: Secondary | ICD-10-CM | POA: Insufficient documentation

## 2016-05-29 DIAGNOSIS — R002 Palpitations: Secondary | ICD-10-CM | POA: Insufficient documentation

## 2016-05-29 DIAGNOSIS — I493 Ventricular premature depolarization: Secondary | ICD-10-CM | POA: Diagnosis not present

## 2016-05-29 LAB — ECHOCARDIOGRAM COMPLETE
CHL CUP STROKE VOLUME: 32 mL
E decel time: 197 msec
E/e' ratio: 6.49
FS: 33 % (ref 28–44)
IV/PV OW: 1
LA diam index: 1.63 cm/m2
LA vol A4C: 35.4 ml
LASIZE: 30 mm
LAVOL: 43.3 mL
LAVOLIN: 23.6 mL/m2
LDCA: 2.54 cm2
LEFT ATRIUM END SYS DIAM: 30 mm
LV E/e'average: 6.49
LV dias vol: 48 mL (ref 46–106)
LV sys vol index: 9 mL/m2
LV sys vol: 16 mL (ref 14–42)
LVDIAVOLIN: 26 mL/m2
LVEEMED: 6.49
LVELAT: 13.3 cm/s
LVOT SV: 54 mL
LVOT VTI: 21.4 cm
LVOT diameter: 18 mm
LVOT peak grad rest: 4 mmHg
LVOTPV: 102 cm/s
MV Dec: 197
MV pk A vel: 62.9 m/s
MV pk E vel: 86.3 m/s
MVPG: 3 mmHg
PW: 8.11 mm — AB (ref 0.6–1.1)
RV LATERAL S' VELOCITY: 13.1 cm/s
Simpson's disk: 67
TAPSE: 21.1 mm
TDI e' lateral: 13.3
TDI e' medial: 10.1

## 2016-05-29 LAB — TSH: TSH: 1.49 u[IU]/mL (ref 0.450–4.500)

## 2016-05-29 LAB — T4, FREE: FREE T4: 1.06 ng/dL (ref 0.82–1.77)

## 2016-05-29 NOTE — Telephone Encounter (Signed)
lmtcb 7/19 - lm

## 2016-05-29 NOTE — Telephone Encounter (Signed)
-----   Message from Satira Sark, MD sent at 05/29/2016  2:55 PM EDT ----- Results reviewed. Reassuring results with normal LVEF. Await results of cardiac monitor. A copy of this test should be forwarded to Glo Herring., MD.

## 2016-05-29 NOTE — Progress Notes (Signed)
*  PRELIMINARY RESULTS* Echocardiogram 2D Echocardiogram has been performed.  Samuel Germany 05/29/2016, 1:43 PM

## 2016-06-01 ENCOUNTER — Telehealth: Payer: Self-pay | Admitting: *Deleted

## 2016-06-01 NOTE — Telephone Encounter (Signed)
Pt called - no answer; left message "thyroid function normal" per Dr Beryle Beams.  And to call if she has any questions.

## 2016-06-01 NOTE — Telephone Encounter (Signed)
-----   Message from Annia Belt, MD sent at 05/29/2016  2:33 PM EDT ----- Call pt: thyroid function normal

## 2016-06-12 ENCOUNTER — Telehealth: Payer: Self-pay | Admitting: Cardiology

## 2016-06-12 NOTE — Telephone Encounter (Signed)
Spoke with pt yesterday.

## 2016-06-22 ENCOUNTER — Encounter (HOSPITAL_COMMUNITY): Payer: Self-pay

## 2016-06-22 ENCOUNTER — Encounter (HOSPITAL_COMMUNITY)
Admission: RE | Admit: 2016-06-22 | Discharge: 2016-06-22 | Disposition: A | Discharge status: Home / Self Care | Payer: BC Managed Care – PPO | Source: Ambulatory Visit | Attending: Oncology | Admitting: Oncology

## 2016-06-22 DIAGNOSIS — D45 Polycythemia vera: Secondary | ICD-10-CM | POA: Insufficient documentation

## 2016-06-22 HISTORY — DX: Ventricular premature depolarization: I49.3

## 2016-06-22 LAB — HEMOGLOBIN: Hemoglobin: 12.1 g/dL (ref 12.0–15.0)

## 2016-06-22 NOTE — Procedures (Signed)
Hgb 12.1 today, Therapeutic Phlebotomy performed per order, started at 1222, completed 1240, pt. Tolerated well, Total of 500 ml of blood removed.

## 2016-06-22 NOTE — Discharge Instructions (Signed)
Therapeutic Phlebotomy, Care After  Refer to this sheet in the next few weeks. These instructions provide you with information about caring for yourself after your procedure. Your health care provider may also give you more specific instructions. Your treatment has been planned according to current medical practices, but problems sometimes occur. Call your health care provider if you have any problems or questions after your procedure.  WHAT TO EXPECT AFTER THE PROCEDURE  After your procedure, it is common to have:   Light-headedness or dizziness. You may feel faint.   Nausea.   Tiredness.  HOME CARE INSTRUCTIONS  Activities   Return to your normal activities as directed by your health care provider. Most people can go back to their normal activities right away.   Avoid strenuous physical activity and heavy lifting or pulling for about 5 hours after the procedure. Do not lift anything that is heavier than 10 lb (4.5 kg).   Athletes should avoid strenuous exercise for at least 12 hours.   Change positions slowly for the remainder of the day. This will help to prevent light-headedness or fainting.   If you feel light-headed, lie down until the feeling goes away.  Eating and Drinking   Be sure to eat well-balanced meals for the next 24 hours.   Drink enough fluid to keep your urine clear or pale yellow.   Avoid drinking alcohol on the day that you had the procedure.  Care of the Needle Insertion Site   Keep your bandage dry. You can remove the bandage after about 5 hours or as directed by your health care provider.   If you have bleeding from the needle insertion site, elevate your arm and press firmly on the site until the bleeding stops.   If you have bruising at the site, apply ice to the area:   Put ice in a plastic bag.   Place a towel between your skin and the bag.   Leave the ice on for 20 minutes, 2-3 times a day for the first 24 hours.   If the swelling does not go away after 24 hours, apply  a warm, moist washcloth to the area for 20 minutes, 2-3 times a day.  General Instructions   Avoid smoking for at least 30 minutes after the procedure.   Keep all follow-up visits as directed by your health care provider. It is important to continue with further therapeutic phlebotomy treatments as directed.  SEEK MEDICAL CARE IF:   You have redness, swelling, or pain at the needle insertion site.   You have fluid, blood, or pus coming from the needle insertion site.   You feel light-headed, dizzy, or nauseated, and the feeling does not go away.   You notice new bruising at the needle insertion site.   You feel weaker than normal.   You have a fever or chills.  SEEK IMMEDIATE MEDICAL CARE IF:   You have severe nausea or vomiting.   You have chest pain.   You have trouble breathing.    This information is not intended to replace advice given to you by your health care provider. Make sure you discuss any questions you have with your health care provider.    Document Released: 04/02/2011 Document Revised: 03/15/2015 Document Reviewed: 10/25/2014  Elsevier Interactive Patient Education 2016 [employer]

## 2016-07-14 ENCOUNTER — Other Ambulatory Visit (HOSPITAL_COMMUNITY): Payer: Self-pay | Admitting: Psychiatry

## 2016-07-14 DIAGNOSIS — F418 Other specified anxiety disorders: Secondary | ICD-10-CM

## 2016-07-14 DIAGNOSIS — F5105 Insomnia due to other mental disorder: Principal | ICD-10-CM

## 2016-07-19 ENCOUNTER — Ambulatory Visit (INDEPENDENT_AMBULATORY_CARE_PROVIDER_SITE_OTHER): Payer: BC Managed Care – PPO | Admitting: Psychiatry

## 2016-07-19 ENCOUNTER — Encounter (HOSPITAL_COMMUNITY): Payer: Self-pay | Admitting: Psychiatry

## 2016-07-19 VITALS — BP 127/76 | HR 73 | Ht 67.0 in | Wt 156.6 lb

## 2016-07-19 DIAGNOSIS — F418 Other specified anxiety disorders: Secondary | ICD-10-CM

## 2016-07-19 DIAGNOSIS — F341 Dysthymic disorder: Secondary | ICD-10-CM

## 2016-07-19 DIAGNOSIS — F429 Obsessive-compulsive disorder, unspecified: Secondary | ICD-10-CM | POA: Diagnosis not present

## 2016-07-19 DIAGNOSIS — F321 Major depressive disorder, single episode, moderate: Secondary | ICD-10-CM

## 2016-07-19 DIAGNOSIS — F5105 Insomnia due to other mental disorder: Secondary | ICD-10-CM | POA: Diagnosis not present

## 2016-07-19 DIAGNOSIS — F1994 Other psychoactive substance use, unspecified with psychoactive substance-induced mood disorder: Secondary | ICD-10-CM

## 2016-07-19 MED ORDER — SERTRALINE HCL 100 MG PO TABS: 150.0000 mg | ORAL_TABLET | Freq: Every day | ORAL | 3 refills | Status: DC

## 2016-07-19 MED ORDER — TRAZODONE HCL 50 MG PO TABS: 50.0000 mg | ORAL_TABLET | Freq: Every day | ORAL | 3 refills | Status: DC

## 2016-07-19 NOTE — Progress Notes (Signed)
Patient ID: Cheryl Cross, female   DOB: 05/29/1966, 50 y.o.   MRN: [id number] Patient ID: LARSYN LOSA, female   DOB: 07-Mar-1966, 50 y.o.   MRN: [id number] Patient ID: LYLLAH OLIVETO, female   DOB: [birth year], 50 y.o.   MRN: [id number] Patient ID: GRACELAND WOODHOUSE, female   DOB: 12/05/1965, 50 y.o.   MRN: [id number] Patient ID: SHANETRA AMATUCCI, female   DOB: 1965-12-01, 50 y.o.   MRN: [id number] Patient ID: BYRDIE VOEGELE, female   DOB: 1966-07-17, 50 y.o.   MRN: [id number] Patient ID: SAVANAH LECHTENBERG, female   DOB: 11/17/1965, 50 y.o.   MRN: [id number] Patient ID: HENRIE MOORING, female   DOB: 06-19-1966, 50 y.o.   MRN: [id number] Patient ID: JALEIGH PROVAN, female   DOB: September 22, 1966, 50 y.o.   MRN: [id number] Patient ID: TRECA TALLMADGE, female   DOB: Jun 17, 1966, 50 y.o.   MRN: [id number] [facility] MD Progress Note [id number] SAQQARA NEMET  MRN:  [id number] DOB:: 03-04-1966 Age: 50 y.o. Date: 07/19/2016  Chief Complaint: Chief Complaint  Patient presents with  . Depression  . Anxiety  . Follow-up   History of present illness This patient is a 50 year old separated white female who lives alone in [place]. 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 She had back pain and fibromyalgia. She got hooked on taking too many Xanax and was sleeping all the time and unable to function. [facility] and got off the Xanax and since then has been on trazodone and Zoloft. 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 She sees Dr. Jefm Miles here which she finds very helpful. She denies being depressed and stays on a good regimen of eating and sleeping. Her mood is generally been good and she's not significantly anxious. She's not abusing any substances and she denies suicidal ideation  The patient returns  after 4 months. She has been doing well. Her mood is good,and she is enjoying her job. She's not had any relapses into benzodiazepine abuse. She stays on a good regimen of sleeping and eating well.ideation. She had some PVCs over the summer and saw cardiology but no new medications have been given and she was advised to cut down on caffeine. She states that she can drink a case of Pepsi over 2 days. She has not really try to cut down but the PVCs have stopped Diagnosis:   Axis I: Major Depression, Recurrent severe, Substance Abuse and Substance Induced Mood Disorder Axis II: Deferred Axis III:  Past Medical History:  Diagnosis Date  . Anxiety   . Arthritis   . Bursitis   . DDD (degenerative disc disease)   . Depression   . Fibromyalgia   . Obsessive-compulsive disorder   . Overactive bladder   . 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 PTSD (post-traumatic stress disorder)   . PVC's (premature ventricular contractions)    pt. placed on heart monitor x 24hours, everything fine   . Sesamoiditis February 2017   Axis IV: other psychosocial or environmental problems Axis V: 51-60 moderate symptoms  ADL's:  Intact  Sleep: Good, thanks to Trazodone  Appetite:  Fair  Suicidal Ideation:  Pt denies any thoughts, plans, intent of suicide\ Homicidal Ideation:  Pt denies any thoughts, plans, intent of homicide  AEB (as evidenced by):per pt report  Psychiatric Specialty  Exam: ROS Patient ID: Cheryl Cross, female   DOB: 1966-05-08, 50 y.o.   MRN: [id number] Patient ID: Cheryl Cross, female   DOB: 1966/10/16, 50 y.o.   MRN: [id number] Patient ID: Cheryl Cross, female   DOB: 1966-08-07, 50 y.o.   MRN: [id number] [facility] MD Progress Note [id number] ILIYA JESSUP  MRN:  [id number] DOB:: 03-20-1966 Age: 50 y.o. Date: 07/19/2016  Chief Complaint: Chief Complaint  Patient presents with  . Depression  . Anxiety  . Follow-up   t Diagnosis:   Axis I: Major Depression,  Recurrent severe, Substance Abuse and Substance Induced Mood Disorder Axis II: Deferred Axis III:  Past Medical History:  Diagnosis Date  . Anxiety   . Arthritis   . Bursitis   . DDD (degenerative disc disease)   . Depression   . Fibromyalgia   . Obsessive-compulsive disorder   . Overactive bladder   . 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 PTSD (post-traumatic stress disorder)   . PVC's (premature ventricular contractions)    pt. placed on heart monitor x 24hours, everything fine   . Sesamoiditis February 2017   Axis IV: other psychosocial or environmental problems Axis V: 51-60 moderate symptoms  ADL's:  Intact  Sleep: Good, thanks to Trazodone  Appetite:  Fair  Suicidal Ideation:  Pt denies any thoughts, plans, intent of suicide\ Homicidal Ideation:  Pt denies any thoughts, plans, intent of homicide  AEB (as evidenced by):per pt report  Psychiatric Specialty Exam: ROS  Blood pressure 127/76, pulse 73, height 5\' 7"  (1.702 m), weight 156 lb 9.6 oz (71 kg).Body mass index is 24.53 kg/m.  General Appearance: Casual  Eye Contact::  Good  Speech:  Clear and Coherent  Volume:  Normal  Mood: good   Affect:  Congruent  Thought Process:  Coherent, Linear and Logical  Orientation:  Full (Time, Place, and Person)  Thought Content:  WDL  Suicidal Thoughts:  No  Homicidal Thoughts:  No  Memory:  Immediate;   Good Recent;   Good Remote;   Good  Judgement:  Good  Insight:  Good  Psychomotor Activity:  TYpical  Concentration:  Good  Recall:  Good  Akathisia:  No  Handed:  Right  AIMS (if indicated):     Assets:  Communication Skills Desire for Improvement  Sleep:      Current Medications: Current Outpatient Prescriptions  Medication Sig Dispense Refill  . aspirin 81 MG tablet Take 1 tablet (81 mg total) by mouth daily. For Polycythemia Vera.    Marland Kitchen ketorolac (ACULAR) 0.5 % ophthalmic solution Place 1 drop into both eyes daily as needed.    . sertraline (ZOLOFT)  100 MG tablet Take 1.5 tablets (150 mg total) by mouth daily. 45 tablet 3  . traZODone (DESYREL) 50 MG tablet Take 1 tablet (50 mg total) by mouth at bedtime. For sleep. 30 tablet 3   No current facility-administered medications for this visit.    Lab Results:  Results for orders placed or performed during the hospital encounter of 06/22/16 (from the past 8736 hour(s))  Hemoglobin   Collection Time: 06/22/16 11:59 AM  Result Value Ref Range   Hemoglobin 12.1 12.0 - 15.0 g/dL  Results for orders placed or performed during the hospital encounter of 05/29/16 (from the past 8736 hour(s))  ECHOCARDIOGRAM COMPLETE   Collection Time: 05/29/16  1:36 PM  Result Value Ref Range   LV PW d 8.11 (A) 0.6 - 1.1 mm   FS 33 28 - 44 %  LA vol 43.3 mL   LA ID, A-P, ES 30 mm   IVS/LV PW RATIO, ED 1    Stroke v 32 ml   LVOT VTI 21.4 cm   LV e' LATERAL 13.3 cm/s   LV E/e' medial 6.49    LV E/e'average 6.49    LA diam index 1.63 cm/m2   LA vol A4C 35.4 ml   LVOT peak grad rest 4 mmHg   E decel time 197 msec   LVOT diameter 18 mm   LVOT area 2.54 cm2   LVOT peak vel 102 cm/s   LVOT SV 54 mL   Peak grad 3 mmHg   E/e' ratio 6.49    MV pk E vel 86.3 m/s   MV pk A vel 62.9 m/s   LV sys vol 16 14 - 42 mL   LV sys vol index 9 mL/m2   LV dias vol 48 46 - 106 mL   LV dias vol index 26 mL/m2   LA vol index 23.6 mL/m2   MV Dec 197    LA diam end sys 30 mm   Simpson's disk 67    TDI e' medial 10.1    TDI e' lateral 13.3    Lateral S' vel 13.1 cm/sec   TAPSE 21.1 mm  Results for orders placed or performed in visit on 05/28/16 (from the past 8736 hour(s))  TSH   Collection Time: 05/28/16 10:01 AM  Result Value Ref Range   TSH 1.490 0.450 - 4.500 uIU/mL  T4, Free   Collection Time: 05/28/16 10:01 AM  Result Value Ref Range   Free T4 1.06 0.82 - 1.77 ng/dL  Results for orders placed or performed in visit on 05/21/16 (from the past 8736 hour(s))  CBC with Differential/Platelet   Collection Time:  05/21/16 10:15 AM  Result Value Ref Range   WBC 7.0 3.4 - 10.8 x10E3/uL   RBC 4.38 3.77 - 5.28 x10E6/uL   Hemoglobin 12.1 11.1 - 15.9 g/dL   Hematocrit 37.9 34.0 - 46.6 %   MCV 87 79 - 97 fL   MCH 27.6 26.6 - 33.0 pg   MCHC 31.9 31.5 - 35.7 g/dL   RDW 15.8 (H) 12.3 - 15.4 %   Platelets 303 150 - 379 x10E3/uL   Neutrophils 66 %   Lymphs 24 %   Monocytes 8 %   Eos 2 %   Basos 0 %   Neutrophils Absolute 4.6 1.4 - 7.0 x10E3/uL   Lymphocytes Absolute 1.7 0.7 - 3.1 x10E3/uL   Monocytes Absolute 0.6 0.1 - 0.9 x10E3/uL   EOS (ABSOLUTE) 0.1 0.0 - 0.4 x10E3/uL   Basophils Absolute 0.0 0.0 - 0.2 x10E3/uL   Immature Granulocytes 0 %   Immature Grans (Abs) 0.0 0.0 - 0.1 x10E3/uL  Comprehensive metabolic panel   Collection Time: 05/21/16 10:15 AM  Result Value Ref Range   Glucose 79 65 - 99 mg/dL   BUN 6 6 - 24 mg/dL   Creatinine, Ser 0.67 0.57 - 1.00 mg/dL   GFR calc non Af Amer 104 >59 mL/min/1.73   GFR calc Af Amer 120 >59 mL/min/1.73   BUN/Creatinine Ratio 9 9 - 23   Sodium 138 134 - 144 mmol/L   Potassium 4.1 3.5 - 5.2 mmol/L   Chloride 100 96 - 106 mmol/L   CO2 21 18 - 29 mmol/L   Calcium 8.8 8.7 - 10.2 mg/dL   Total Protein 6.3 6.0 - 8.5 g/dL   Albumin 4.2 3.5 -  5.5 g/dL   Globulin, Total 2.1 1.5 - 4.5 g/dL   Albumin/Globulin Ratio 2.0 1.2 - 2.2   Bilirubin Total <0.2 0.0 - 1.2 mg/dL   Alkaline Phosphatase 60 39 - 117 IU/L   AST 13 0 - 40 IU/L   ALT 9 0 - 32 IU/L  Ferritin   Collection Time: 05/21/16 10:15 AM  Result Value Ref Range   Ferritin 9 (L) 15 - 150 ng/mL  Results for orders placed or performed during the hospital encounter of 05/20/16 (from the past 8736 hour(s))  CBC with Differential   Collection Time: 05/20/16  1:41 PM  Result Value Ref Range   WBC 8.8 4.0 - 10.5 K/uL   RBC 4.47 3.87 - 5.11 MIL/uL   Hemoglobin 12.8 12.0 - 15.0 g/dL   HCT 39.1 36.0 - 46.0 %   MCV 87.5 78.0 - 100.0 fL   MCH 28.6 26.0 - 34.0 pg   MCHC 32.7 30.0 - 36.0 g/dL   RDW 14.6  11.5 - 15.5 %   Platelets 281 150 - 400 K/uL   Neutrophils Relative % 64 %   Neutro Abs 5.6 1.7 - 7.7 K/uL   Lymphocytes Relative 27 %   Lymphs Abs 2.4 0.7 - 4.0 K/uL   Monocytes Relative 7 %   Monocytes Absolute 0.7 0.1 - 1.0 K/uL   Eosinophils Relative 2 %   Eosinophils Absolute 0.2 0.0 - 0.7 K/uL   Basophils Relative 0 %   Basophils Absolute 0.0 0.0 - 0.1 K/uL  Comprehensive metabolic panel   Collection Time: 05/20/16  1:41 PM  Result Value Ref Range   Sodium 138 135 - 145 mmol/L   Potassium 3.2 (L) 3.5 - 5.1 mmol/L   Chloride 104 101 - 111 mmol/L   CO2 27 22 - 32 mmol/L   Glucose, Bld 85 65 - 99 mg/dL   BUN 5 (L) 6 - 20 mg/dL   Creatinine, Ser 0.57 0.44 - 1.00 mg/dL   Calcium 8.8 (L) 8.9 - 10.3 mg/dL   Total Protein 7.3 6.5 - 8.1 g/dL   Albumin 4.2 3.5 - 5.0 g/dL   AST 16 15 - 41 U/L   ALT 12 (L) 14 - 54 U/L   Alkaline Phosphatase 60 38 - 126 U/L   Total Bilirubin 0.3 0.3 - 1.2 mg/dL   GFR calc non Af Amer >60 >60 mL/min   GFR calc Af Amer >60 >60 mL/min   Anion gap 7 5 - 15  Troponin I   Collection Time: 05/20/16  1:41 PM  Result Value Ref Range   Troponin I <0.03 <0.03 ng/mL  Results for orders placed or performed during the hospital encounter of 03/09/16 (from the past 8736 hour(s))  Hemoglobin   Collection Time: 03/09/16  2:20 PM  Result Value Ref Range   Hemoglobin 12.2 12.0 - 15.0 g/dL  Results for orders placed or performed during the hospital encounter of 11/08/15 (from the past 8736 hour(s))  Hemoglobin   Collection Time: 11/08/15 10:55 AM  Result Value Ref Range   Hemoglobin 12.6 12.0 - 15.0 g/dL   Physical Findings: AIMS:  , ,  ,  ,    CIWA:    COWS:     Treatment Plan Summary: Medication management  Plan: I will continue Zoloft or depression and trazodone for sleep.  Recommend to call us back if she has any question concerns or if she feels worsening symptoms.  Followup in 4 months.  MEDICATIONS this encounter: Meds ordered this  encounter   Medications  . sertraline (ZOLOFT) 100 MG tablet    Sig: Take 1.5 tablets (150 mg total) by mouth daily.    Dispense:  45 tablet    Refill:  3  . traZODone (DESYREL) 50 MG tablet    Sig: Take 1 tablet (50 mg total) by mouth at bedtime. For sleep.    Dispense:  30 tablet    Refill:  3    Medical Decision Making Problem Points:  Established problem, stable/improving (1) and Review of last therapy session (1) Data Points:  Review or order clinical lab tests (1) Review of medication regiment & side effects (2)  ROSS, [place] 07/19/2016, 4:31 PM

## 2016-09-20 IMAGING — US US ABDOMEN COMPLETE
1 series · 14 of 25 positions shown · non-contrast
Comparison: CT abdomen and pelvis [DATE], ultrasound abdomen
RIGHT upper quadrant [DATE]

CLINICAL DATA: Unexplained RIGHT upper abdominal quadrant pain,
normal LFTs, history of irritable bowel syndrome, smoking,
fibromyalgia

EXAM:
ABDOMEN ULTRASOUND COMPLETE

[Series 1: us abdomen complete · 0.12mm/px · 14 of 121 slices shown]
[im 1/121]
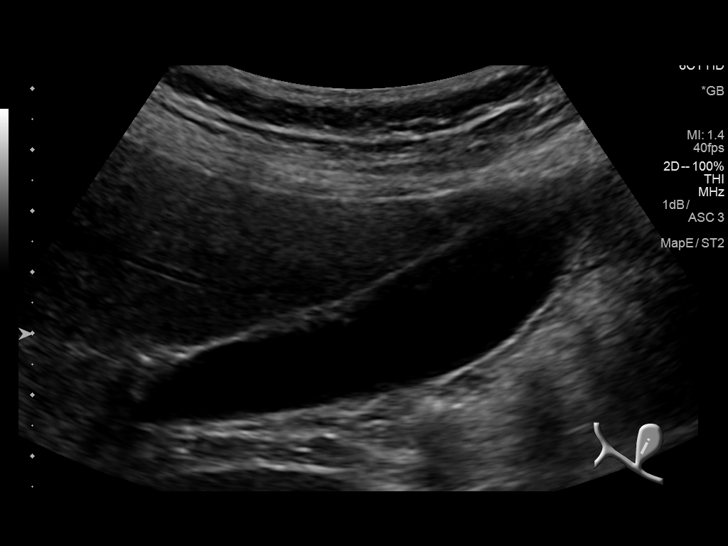
[im 11/121]
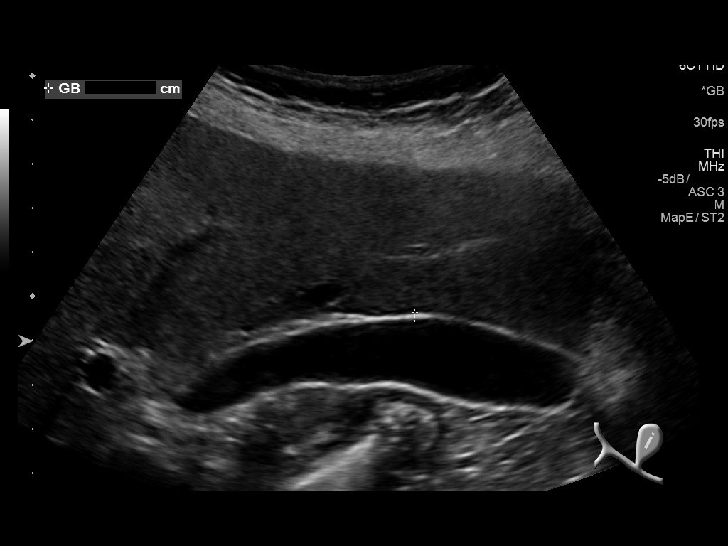
[im 21/121]
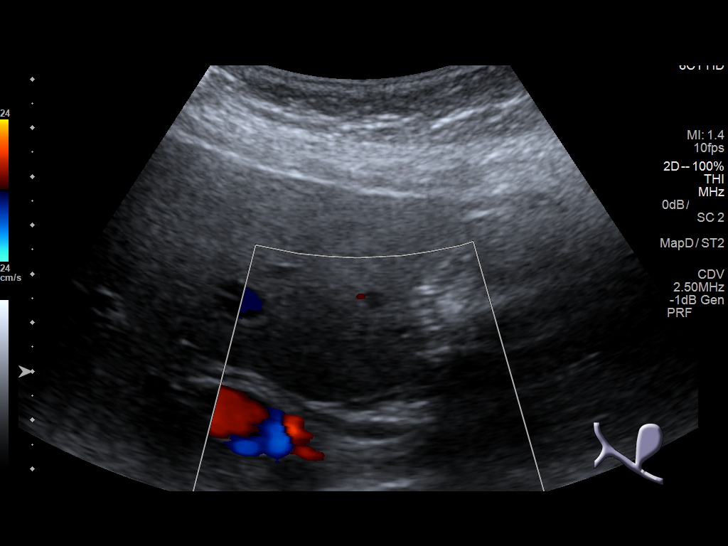
[im 31/121]
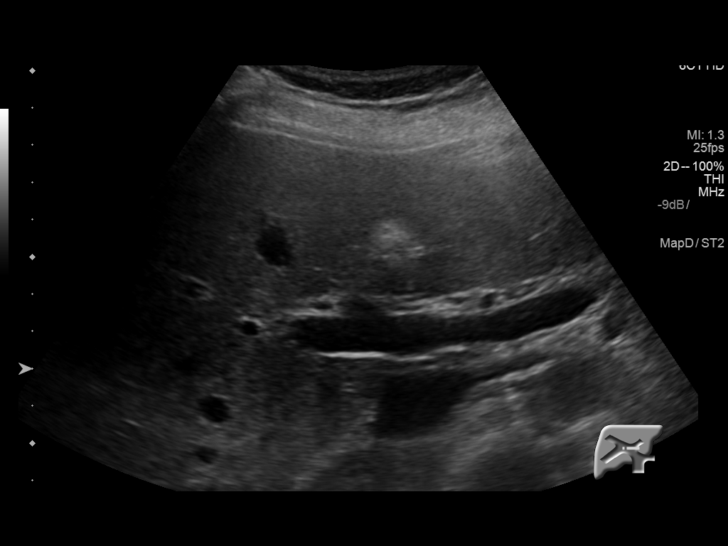
[im 41/121]
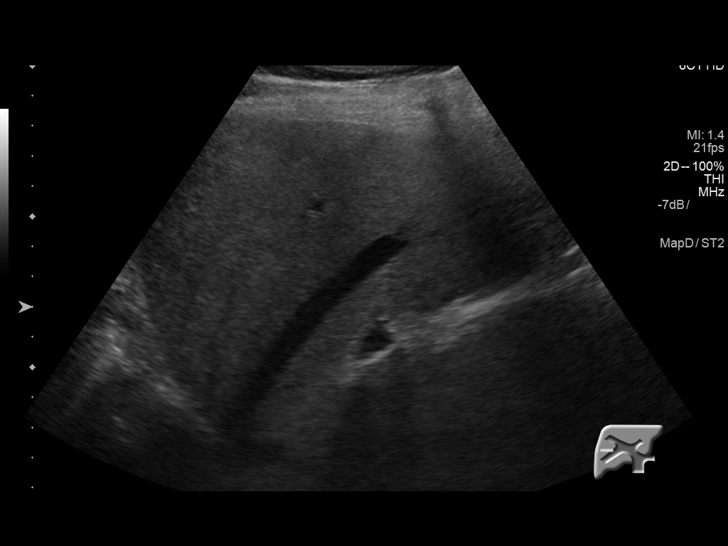
[im 46/121]
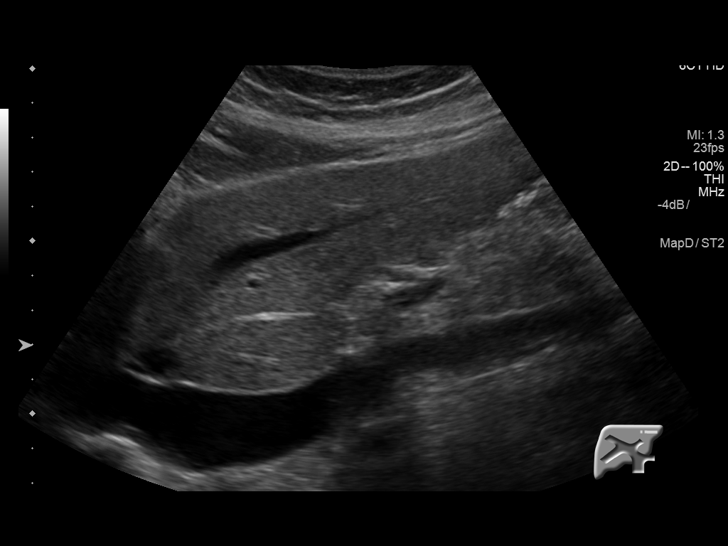
[im 56/121]
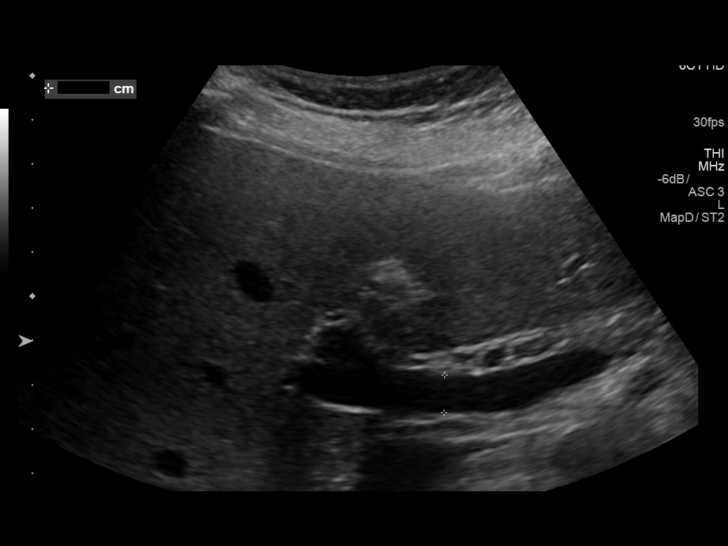
[im 66/121]
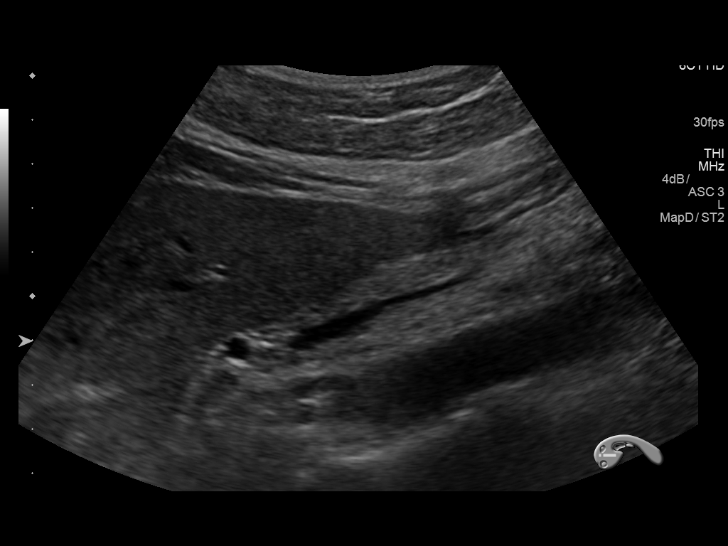
[im 76/121]
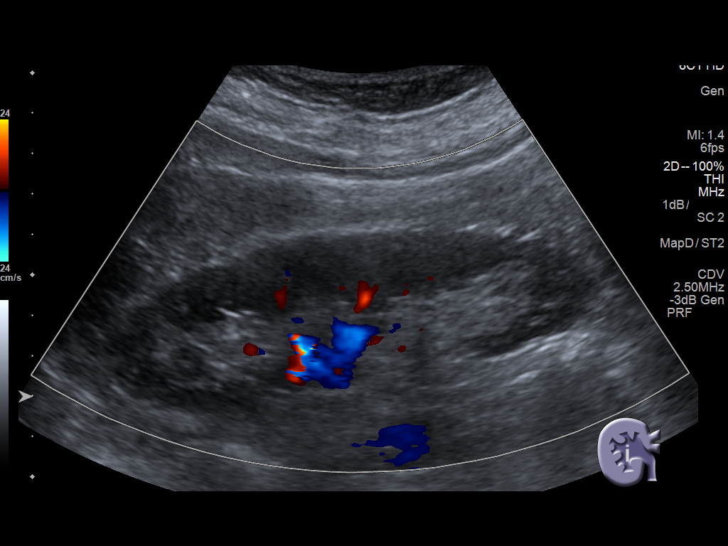
[im 81/121]
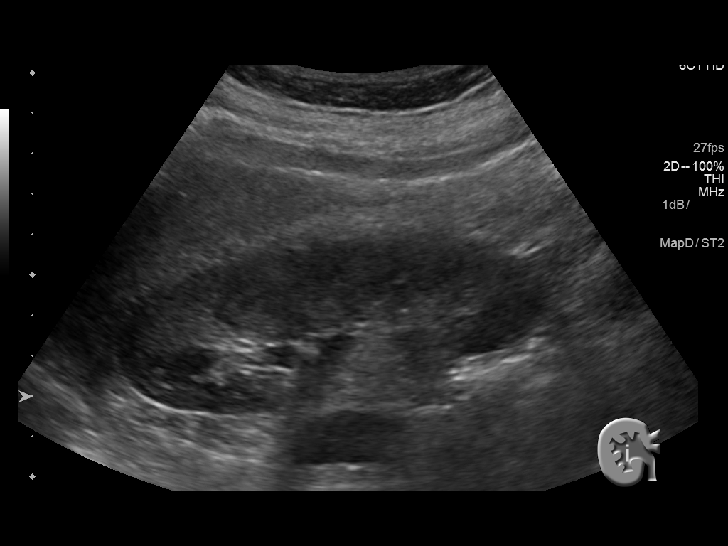
[im 91/121]
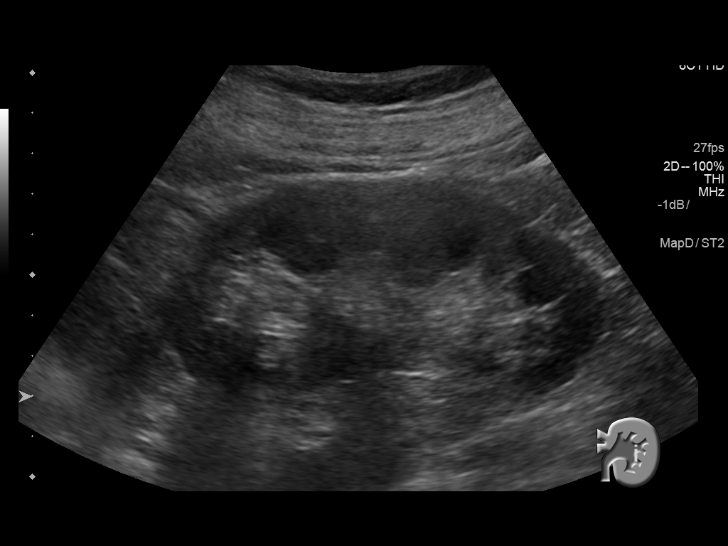
[im 101/121]
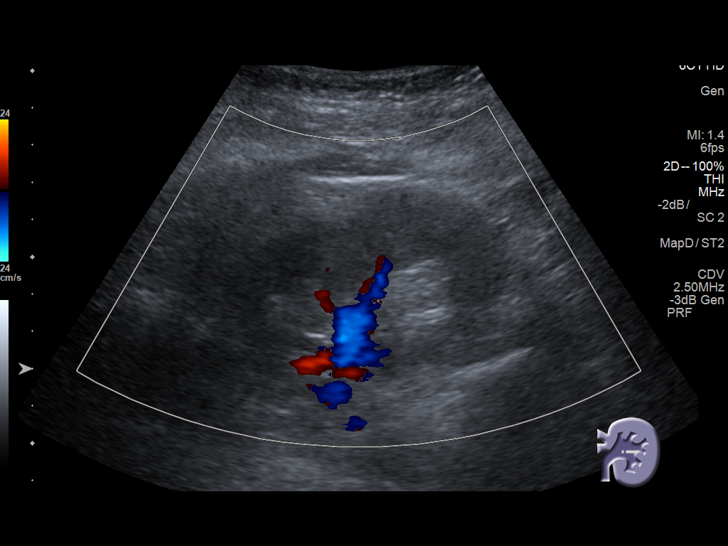
[im 111/121]
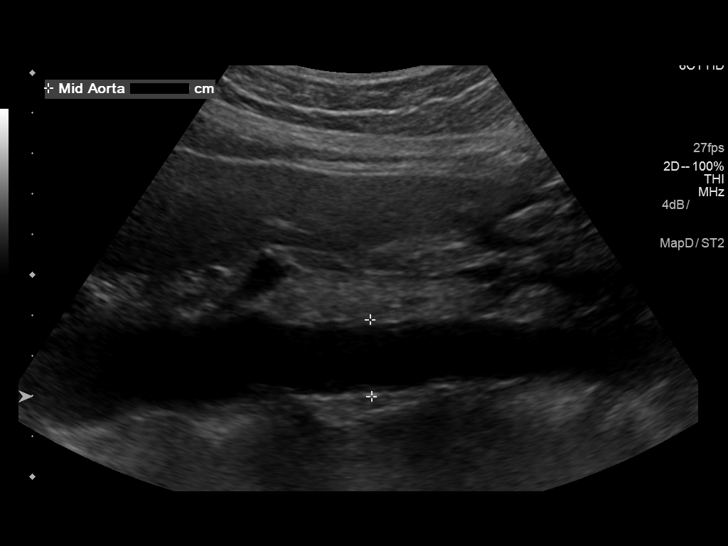
[im 121/121]
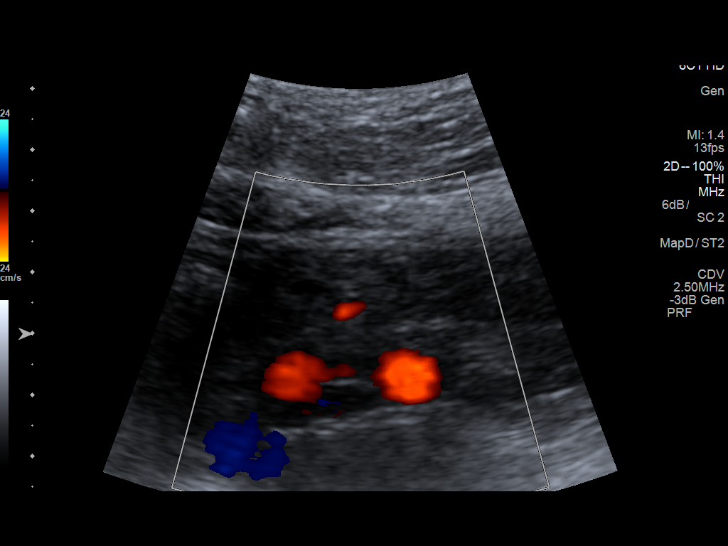

[14 of 25 positions shown; findings below may reference images not displayed]

FINDINGS: Gallbladder: Normally distended with normal gallbladder wall
thickness. No gallstones, gallbladder wall thickening,
pericholecystic fluid, or sonographic Murphy sign.

Common bile duct: Diameter: 4 mm diameter, normal

Liver: Normal parenchymal echogenicity without mass. Smooth hepatic
contours. Portal vein is patent on color Doppler imaging with normal
direction of blood flow towards the liver.

IVC: Normal appearance

Pancreas: Normal appearance

Spleen: Normal appearance, 9.9 cm length

Right Kidney: Length: 11.8 cm. Normal morphology without mass or
hydronephrosis.

Left Kidney: Length: 11.1 cm. Normal morphology without mass or
hydronephrosis.

Abdominal aorta: Normal caliber

Other findings: No RIGHT upper quadrant free fluid.
IMPRESSION: Normal exam.

## 2016-10-25 ENCOUNTER — Other Ambulatory Visit: Payer: Self-pay | Admitting: Oncology

## 2016-10-25 DIAGNOSIS — D45 Polycythemia vera: Secondary | ICD-10-CM

## 2016-10-26 ENCOUNTER — Encounter (HOSPITAL_COMMUNITY): Payer: BC Managed Care – PPO

## 2016-11-01 ENCOUNTER — Encounter (HOSPITAL_COMMUNITY): Payer: Self-pay

## 2016-11-01 ENCOUNTER — Encounter (HOSPITAL_COMMUNITY)
Admission: RE | Admit: 2016-11-01 | Discharge: 2016-11-01 | Disposition: A | Discharge status: Home / Self Care | Payer: BC Managed Care – PPO | Source: Ambulatory Visit | Attending: Oncology | Admitting: Oncology

## 2016-11-01 DIAGNOSIS — D45 Polycythemia vera: Secondary | ICD-10-CM | POA: Insufficient documentation

## 2016-11-01 LAB — HEMOGLOBIN: Hemoglobin: 13.9 g/dL (ref 12.0–15.0)

## 2016-11-01 NOTE — Progress Notes (Signed)
Cheryl Cross presents today for phlebotomy per MD orders. HGB 13.9. Phlebotomy procedure started at 1140 and ended at 1205 500 cc removed. Patient tolerated procedure well. Refused to stay recommended observation time. Stated husband was driving her.

## 2016-11-12 ENCOUNTER — Emergency Department (HOSPITAL_COMMUNITY)
Admission: EM | Admit: 2016-11-12 | Discharge: 2016-11-12 | Disposition: A | Discharge status: Home / Self Care | Payer: BC Managed Care – PPO | Attending: Emergency Medicine | Admitting: Emergency Medicine

## 2016-11-12 ENCOUNTER — Emergency Department (HOSPITAL_COMMUNITY): Payer: BC Managed Care – PPO

## 2016-11-12 ENCOUNTER — Encounter (HOSPITAL_COMMUNITY): Payer: Self-pay | Admitting: Emergency Medicine

## 2016-11-12 DIAGNOSIS — F17210 Nicotine dependence, cigarettes, uncomplicated: Secondary | ICD-10-CM | POA: Diagnosis not present

## 2016-11-12 DIAGNOSIS — Z7982 Long term (current) use of aspirin: Secondary | ICD-10-CM | POA: Diagnosis not present

## 2016-11-12 DIAGNOSIS — Z79899 Other long term (current) drug therapy: Secondary | ICD-10-CM | POA: Insufficient documentation

## 2016-11-12 DIAGNOSIS — R109 Unspecified abdominal pain: Secondary | ICD-10-CM

## 2016-11-12 LAB — CBC WITH DIFFERENTIAL/PLATELET
Basophils Absolute: 0 10*3/uL (ref 0.0–0.1)
Basophils Relative: 1 %
Eosinophils Absolute: 0.1 10*3/uL (ref 0.0–0.7)
Eosinophils Relative: 2 %
HCT: 39.1 % (ref 36.0–46.0)
Hemoglobin: 12.7 g/dL (ref 12.0–15.0)
Lymphocytes Relative: 30 %
Lymphs Abs: 1.7 10*3/uL (ref 0.7–4.0)
MCH: 28.6 pg (ref 26.0–34.0)
MCHC: 32.5 g/dL (ref 30.0–36.0)
MCV: 88.1 fL (ref 78.0–100.0)
Monocytes Absolute: 0.5 10*3/uL (ref 0.1–1.0)
Monocytes Relative: 9 %
Neutro Abs: 3.3 10*3/uL (ref 1.7–7.7)
Neutrophils Relative %: 58 %
Platelets: 321 10*3/uL (ref 150–400)
RBC: 4.44 MIL/uL (ref 3.87–5.11)
RDW: 15.6 % — ABNORMAL HIGH (ref 11.5–15.5)
WBC: 5.7 10*3/uL (ref 4.0–10.5)

## 2016-11-12 LAB — COMPREHENSIVE METABOLIC PANEL
ALT: 16 U/L (ref 14–54)
AST: 14 U/L — ABNORMAL LOW (ref 15–41)
Albumin: 4.2 g/dL (ref 3.5–5.0)
Alkaline Phosphatase: 60 U/L (ref 38–126)
Anion gap: 5 (ref 5–15)
BUN: <5 mg/dL — ABNORMAL LOW (ref 6–20)
CO2: 27 mmol/L (ref 22–32)
Calcium: 9.3 mg/dL (ref 8.9–10.3)
Chloride: 108 mmol/L (ref 101–111)
Creatinine, Ser: 0.65 mg/dL (ref 0.44–1.00)
GFR calc Af Amer: >60 mL/min (ref >60)
GFR calc non Af Amer: >60 mL/min (ref >60)
Glucose, Bld: 87 mg/dL (ref 65–99)
Potassium: 3.6 mmol/L (ref 3.5–5.1)
Sodium: 140 mmol/L (ref 135–145)
Total Bilirubin: 0.3 mg/dL (ref 0.3–1.2)
Total Protein: 7.2 g/dL (ref 6.5–8.1)

## 2016-11-12 LAB — URINALYSIS, ROUTINE W REFLEX MICROSCOPIC
Bilirubin Urine: NEGATIVE
Glucose, UA: NEGATIVE mg/dL
Hgb urine dipstick: NEGATIVE
Ketones, ur: NEGATIVE mg/dL
Leukocytes, UA: NEGATIVE
Nitrite: NEGATIVE
Protein, ur: NEGATIVE mg/dL
Specific Gravity, Urine: <1.005 — ABNORMAL LOW (ref 1.005–1.030)
pH: 5.5 (ref 5.0–8.0)

## 2016-11-12 LAB — LIPASE, BLOOD: Lipase: 36 U/L (ref 11–51)

## 2016-11-12 IMAGING — CT CT ABD-PELV W/ CM
2 of 5 series · 16 of 46 positions shown, 18 images · IV contrast (Isovue)
Comparison: CT of the abdomen and pelvis on [DATE]

CLINICAL DATA: Left-sided flank and abdominal pain.

EXAM:
CT ABDOMEN AND PELVIS WITH CONTRAST
TECHNIQUE: Multidetector CT imaging of the abdomen and pelvis was performed
using the standard protocol following bolus administration of
intravenous contrast.
CONTRAST:  100mL [4Y] IOPAMIDOL ([4Y]) INJECTION 61%

[Series 2: axial st · axial · 0.68mm/px · z∈[+875,+1275]mm · 13 of 92 slices shown, 15 images]
[im 6/92  soft-tissue]
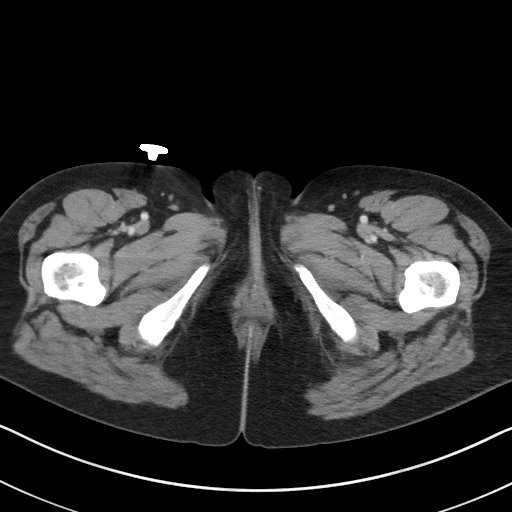
[im 6/92  bone]
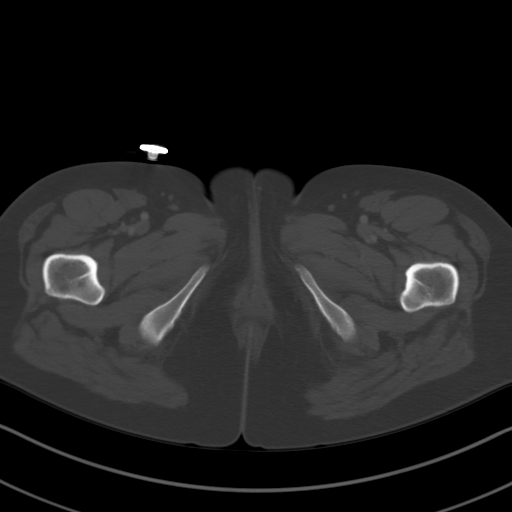
[im 11/92  soft-tissue]
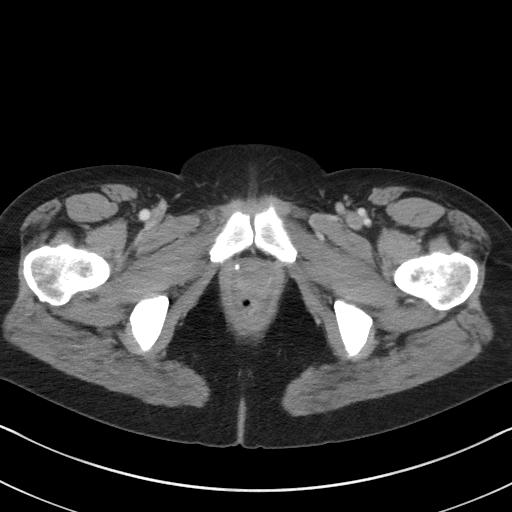
[im 21/92  soft-tissue]
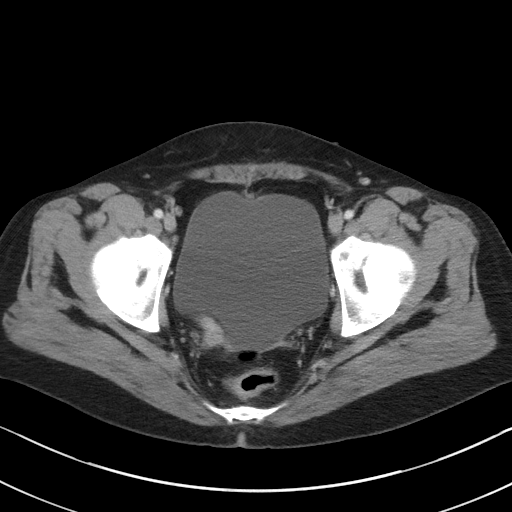
[im 26/92  soft-tissue]
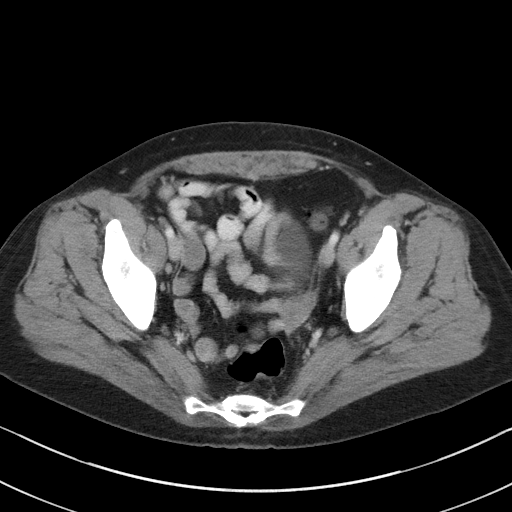
[im 31/92  soft-tissue]
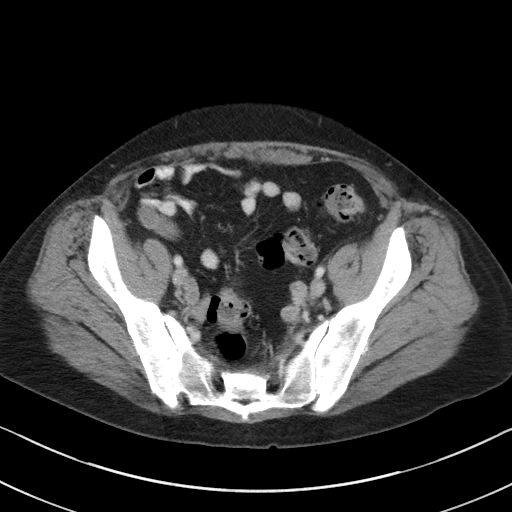
[im 41/92  soft-tissue]
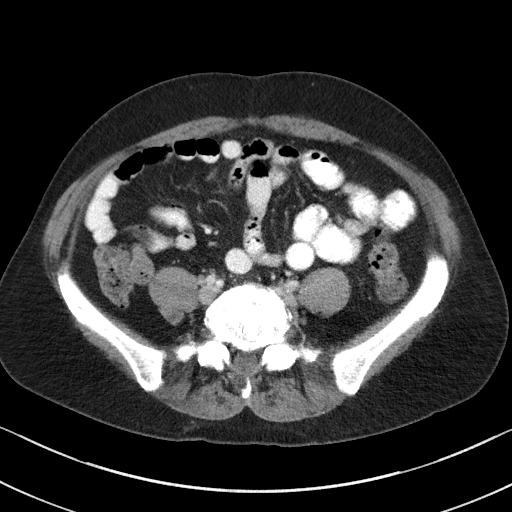
[im 46/92  soft-tissue]
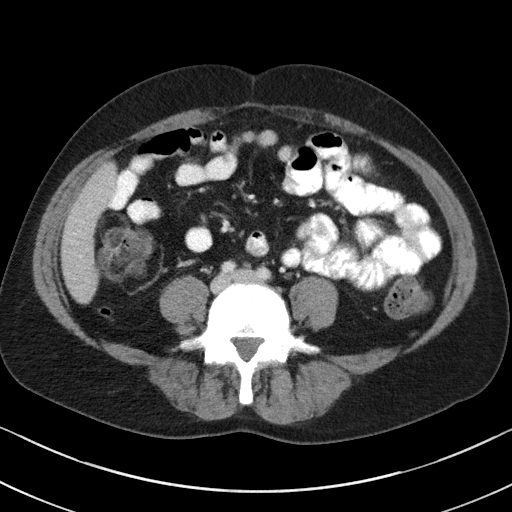
[im 51/92  soft-tissue]
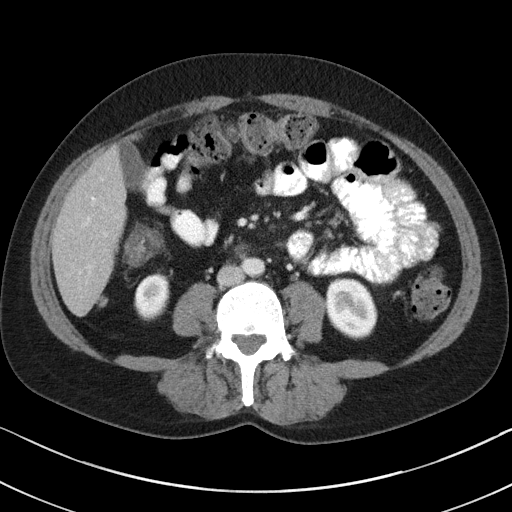
[im 61/92  soft-tissue]
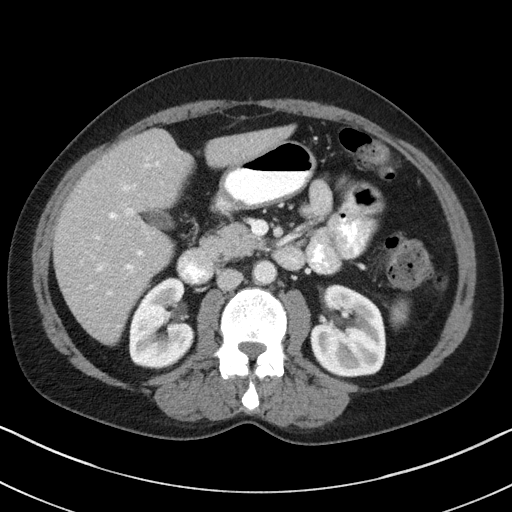
[im 61/92  bone]
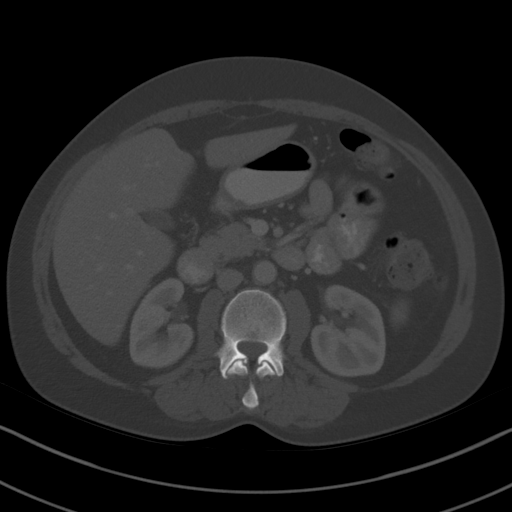
[im 66/92  soft-tissue]
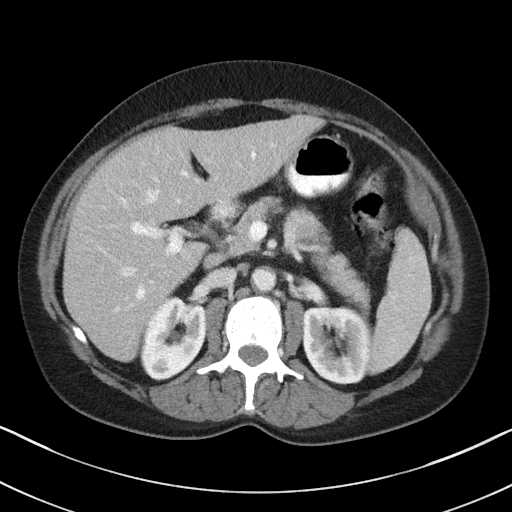
[im 71/92  soft-tissue]
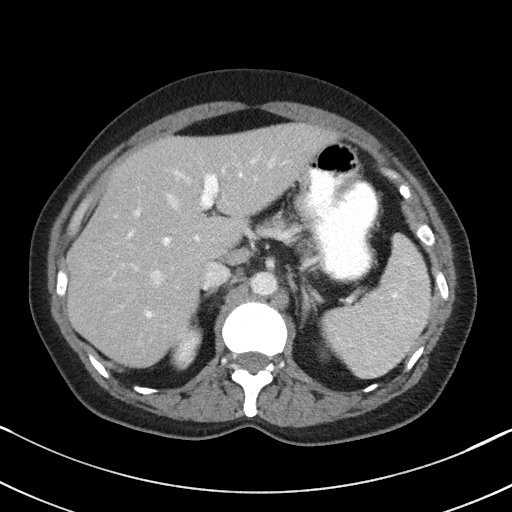
[im 81/92  soft-tissue]
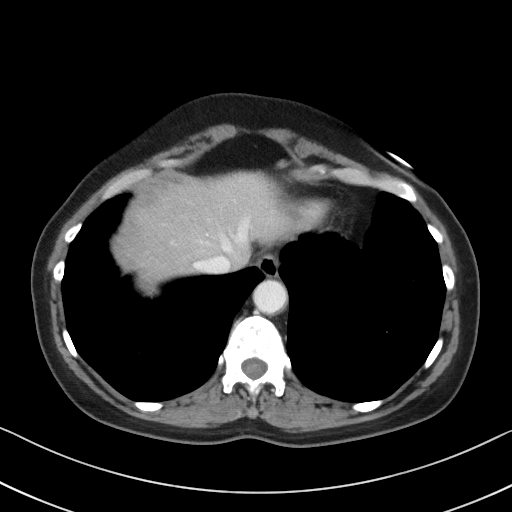
[im 86/92  soft-tissue]
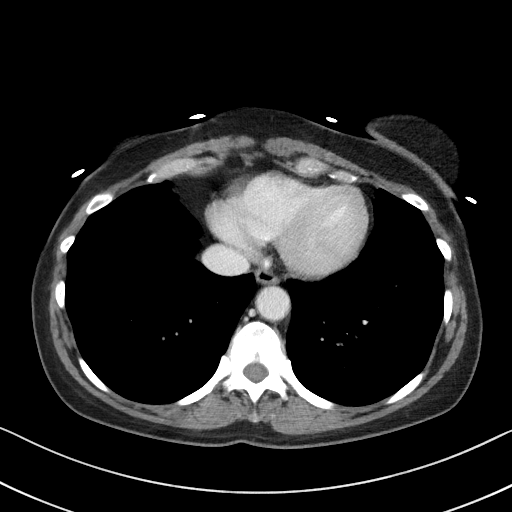

[Series 4: coronal st · coronal · 0.71mm/px · 3 of 78 slices shown]
[im 26/78  soft-tissue]
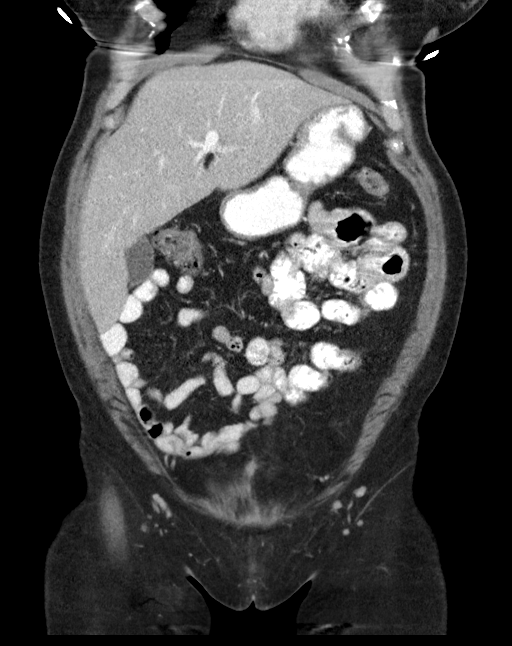
[im 35/78  soft-tissue]
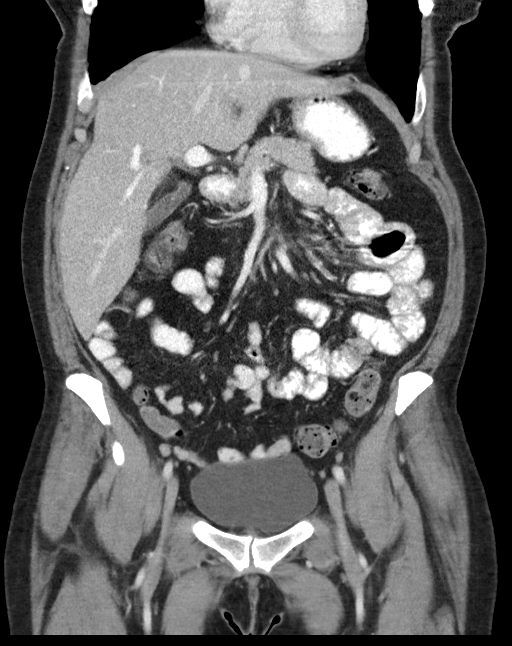
[im 43/78  soft-tissue]
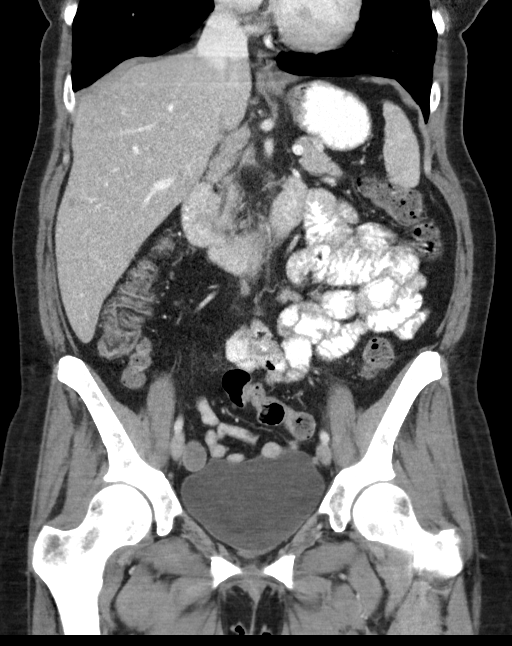

[16 of 46 positions shown; findings below may reference images not displayed]

FINDINGS: Lower chest: No acute abnormality.

Hepatobiliary: No focal liver abnormality is seen. No gallstones,
gallbladder wall thickening, or biliary dilatation.

Pancreas: Unremarkable. No pancreatic ductal dilatation or
surrounding inflammatory changes.

Spleen: Normal in size without focal abnormality.

Adrenals/Urinary Tract: Adrenal glands are unremarkable. Kidneys are
normal, without renal calculi, focal lesion, or hydronephrosis.
Bladder is unremarkable.

Stomach/Bowel: Stomach is within normal limits. Appendix appears
normal. No evidence of bowel wall thickening, distention, or
inflammatory changes. No free air or abscess.

Vascular/Lymphatic: No significant vascular findings are present. No
enlarged abdominal or pelvic lymph nodes.

Reproductive: Status post hysterectomy. No adnexal masses.

Other: No abdominal wall hernia or abnormality. No abdominopelvic
ascites.

Musculoskeletal: Stable appearance of lumbar fusion at the L5-S1
level.
IMPRESSION: Normal CT of the abdomen and pelvis.

## 2016-11-12 MED ORDER — IOPAMIDOL (ISOVUE-300) INJECTION 61%
100.0000 mL | Freq: Once | INTRAVENOUS | Status: AC | PRN
  Administered 2016-11-12: 100 mL via INTRAVENOUS

## 2016-11-12 MED ORDER — TRAMADOL HCL 50 MG PO TABS: 50.0000 mg | ORAL_TABLET | Freq: Four times a day (QID) | ORAL | 0 refills | Status: DC | PRN

## 2016-11-12 MED ORDER — MORPHINE SULFATE (PF) 4 MG/ML IV SOLN
4.0000 mg | Freq: Once | INTRAVENOUS | Status: AC
  Administered 2016-11-12: 4 mg via INTRAVENOUS
  Filled 2016-11-12: qty 1

## 2016-11-12 MED ORDER — SODIUM CHLORIDE 0.9 % IV BOLUS (SEPSIS)
1000.0000 mL | Freq: Once | INTRAVENOUS | Status: AC
  Administered 2016-11-12: 1000 mL via INTRAVENOUS

## 2016-11-12 NOTE — ED Triage Notes (Signed)
Pt reports left flank pain with dysuria since Friday.  States pain is on the right sometimes as well.  Also having urinary leakage and discomfort after urinating.

## 2016-11-12 NOTE — ED Provider Notes (Signed)
[facility] DEPT Provider Note   CSN: [id number] Arrival date & time: 11/12/16  1051   By signing my name below, I, Avnee Patel, attest that this documentation has been prepared under the direction and in the presence of Virgel Manifold, MD  Electronically Signed: Delton Pra[place], ED Scribe. 11/12/16. 11:33 AM.   History   Chief Complaint Chief Complaint  Patient presents with  . Flank Pain  . Dysuria   The history is provided by the patient. No language interpreter was used.   HPI Comments:  Cheryl Cross is a 51 y.o. female, with a hx of bladder infections, who presents to the Emergency Department complaining of sudden onset, moderate left flank pain x 3 days. Pt also reports intermittent right flank pain, an uncomfortable sensation during urination and leakage. Her pain is worse with movement and upon palpation. She has taken tylenol and Advil with no relief. Pt denies fevers, nausea, abnormal bowel movements, any other associated symptoms and any other modifying factors at this time.   Past Medical History:  Diagnosis Date  . Anxiety   . Arthritis   . Bursitis   . DDD (degenerative disc disease)   . Depression   . Fibromyalgia   . Obsessive-compulsive disorder   . Overactive bladder   . 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 PTSD (post-traumatic stress disorder)   . PVC's (premature ventricular contractions)    pt. placed on heart monitor x 24hours, everything fine   . Sesamoiditis February 2017    Patient Active Problem List   Diagnosis Date Noted  . OCD (obsessive compulsive disorder) 02/25/2013  . 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 Benzodiazepine dependence ([place]) 03/13/2012  . Major depressive disorder 03/13/2012  . Polycythemia vera ([place]) 12/07/2011  . ABDOMINAL PAIN, EPIGASTRIC 03/28/2010  . IBS 01/30/2010  . ABDOMINAL PAIN, LEFT LOWER QUADRANT 01/30/2010    Past Surgical History:  Procedure Laterality Date  . ABDOMINAL  HYSTERECTOMY    . ESOPHAGOGASTRODUODENOSCOPY N/A 09/10/2014   Procedure: ESOPHAGOGASTRODUODENOSCOPY (EGD);  Surgeon: Rogene Houston, MD;  Location: [facility];  Service: Endoscopy;  Laterality: N/A;  130 - moved to 3:15 - Ann to notify  . LUMBAR FUSION    . TOOTH EXTRACTION Left Aug 2016    OB History    Gravida Para Term Preterm AB Living             0   SAB TAB Ectopic Multiple Live Births                   Home Medications    Prior to Admission medications   Medication Sig Start Date End Date Taking? Authorizing Provider  aspirin 81 MG tablet Take 1 tablet (81 mg total) by mouth daily. For Polycythemia Vera. 03/15/12   Greig Castilla, FNP  ketorolac (ACULAR) 0.5 % ophthalmic solution Place 1 drop into both eyes daily as needed. 02/09/15   Historical Provider, MD  sertraline (ZOLOFT) 100 MG tablet Take 1.5 tablets (150 mg total) by mouth daily. 07/19/16   Cloria Spring, MD  traZODone (DESYREL) 50 MG tablet Take 1 tablet (50 mg total) by mouth at bedtime. For sleep. 07/19/16 07/01/17  Cloria Spring, MD    Family History Family History  Problem Relation Age of Onset  . Cancer - Colon Mother     age 95  . Anxiety disorder Mother   . Atrial fibrillation Father   . Atrial fibrillation Brother   . Bipolar disorder  Neg Hx   . Dementia Neg Hx   . Drug abuse Neg Hx   . Paranoid behavior Neg Hx   . Schizophrenia Neg Hx   . Seizures Neg Hx   . Sexual abuse Neg Hx   . Physical abuse Neg Hx     Social History Social History  Substance Use Topics  . Smoking status: Current Every Day Smoker    Packs/day: 1.00    Years: 15.00    Types: Cigarettes    Start date: 11/12/1996  . Smokeless tobacco: Never Used     Comment: 1 pack a day x 20 yrs.  . Alcohol use No     Allergies   Cyclobenzaprine; Diclofenac sodium; Gabapentin; Penicillins; Robaxin [methocarbamol]; and Voltaren [diclofenac sodium]   Review of Systems Review of Systems  Constitutional: Negative for fever.    Gastrointestinal: Negative for nausea.  Genitourinary: Positive for flank pain. Negative for dysuria.   Physical Exam Updated Vital Signs BP 124/82 (BP Location: Right Arm)   Pulse 79   Temp 98.1 F (36.7 C) (Oral)   Resp 18   Ht 5\' 7"  (1.702 m)   Wt 154 lb (69.9 kg)   SpO2 99%   BMI 24.12 kg/m   Physical Exam  Constitutional: She is oriented to person, place, and time. She appears well-developed and well-nourished. No distress.  HENT:  Head: Normocephalic and atraumatic.  Eyes: EOM are normal.  Neck: Normal range of motion.  Cardiovascular: Normal rate, regular rhythm and normal heart sounds.   Pulmonary/Chest: Effort normal and breath sounds normal.  Abdominal: Soft. She exhibits no distension. There is tenderness.  Left abdominal tenderness extending to the left flank. No CVA tenderness   Musculoskeletal: Normal range of motion.  Neurological: She is alert and oriented to person, place, and time.  Skin: Skin is warm and dry.  Psychiatric: She has a normal mood and affect. Judgment normal.  Nursing note and vitals reviewed.  ED Treatments / Results  DIAGNOSTIC STUDIES:  Oxygen Saturation is 99% on RA, normal by my interpretation.    COORDINATION OF CARE:  11:21 AM Discussed treatment plan with pt at bedside and pt agreed to plan.  Labs (all labs ordered are listed, but only abnormal results are displayed) Labs Reviewed  URINALYSIS, ROUTINE W REFLEX MICROSCOPIC - Abnormal; Notable for the following:       Result Value   Specific Gravity, Urine <1.005 (*)    All other components within normal limits  CBC WITH DIFFERENTIAL/PLATELET - Abnormal; Notable for the following:    RDW 15.6 (*)    All other components within normal limits  COMPREHENSIVE METABOLIC PANEL - Abnormal; Notable for the following:    BUN <5 (*)    AST 14 (*)    All other components within normal limits  LIPASE, BLOOD    EKG  EKG Interpretation None       Radiology No results found.    Ct Abdomen Pelvis W Contrast  Result Date: 11/12/2016 CLINICAL DATA:  Left-sided flank and abdominal pain. EXAM: CT ABDOMEN AND PELVIS WITH CONTRAST TECHNIQUE: Multidetector CT imaging of the abdomen and pelvis was performed using the standard protocol following bolus administration of intravenous contrast. CONTRAST:  155mL ISOVUE-300 IOPAMIDOL (ISOVUE-300) INJECTION 61% COMPARISON:  CT of the abdomen and pelvis on 01/23/2010 FINDINGS: Lower chest: No acute abnormality. Hepatobiliary: No focal liver abnormality is seen. No gallstones, gallbladder wall thickening, or biliary dilatation. Pancreas: Unremarkable. No pancreatic ductal dilatation or surrounding inflammatory changes. Spleen:  Normal in size without focal abnormality. Adrenals/Urinary Tract: Adrenal glands are unremarkable. Kidneys are normal, without renal calculi, focal lesion, or hydronephrosis. Bladder is unremarkable. Stomach/Bowel: Stomach is within normal limits. Appendix appears normal. No evidence of bowel wall thickening, distention, or inflammatory changes. No free air or abscess. Vascular/Lymphatic: No significant vascular findings are present. No enlarged abdominal or pelvic lymph nodes. Reproductive: Status post hysterectomy. No adnexal masses. Other: No abdominal wall hernia or abnormality. No abdominopelvic ascites. Musculoskeletal: Stable appearance of lumbar fusion at the L5-S1 level. IMPRESSION: Normal CT of the abdomen and pelvis. Electronically Signed   By: Aletta Edouard M.D.   On: 11/12/2016 12:34   Procedures Procedures (including critical care time)  Medications Ordered in ED Medications - No data to display   Initial Impression / Assessment and Plan / ED Course  I have reviewed the triage vital signs and the nursing notes.  Pertinent labs & imaging results that were available during my care of the patient were reviewed by me and considered in my medical decision making (see chart for details).  Clinical Course      51 year old female with left flank pain. Unsure the exact etiology. UA is not particularly remarkable. CT abdomen pelvis is without acute abnormality. She is afebrile. Hemodynamically stable. Nontoxic. I doubt emergent process. Return precautions were discussed. Sinemet treatment otherwise.  Final Clinical Impressions(s) / ED Diagnoses   Final diagnoses:  Left flank pain    New Prescriptions New Prescriptions   No medications on file    I personally preformed the services scribed in my presence. The recorded information has been reviewed is accurate. Virgel Manifold, MD.     Virgel Manifold, MD 11/20/16 [phone]

## 2016-11-12 NOTE — ED Notes (Signed)
Pt finished drinking oral contrast.

## 2016-11-12 NOTE — ED Notes (Signed)
EDP at bedside updating patient and family. 

## 2016-11-12 NOTE — ED Notes (Signed)
Pt ambulated to bathroom 

## 2016-11-15 ENCOUNTER — Ambulatory Visit (INDEPENDENT_AMBULATORY_CARE_PROVIDER_SITE_OTHER): Payer: BC Managed Care – PPO | Admitting: Psychiatry

## 2016-11-15 ENCOUNTER — Encounter (HOSPITAL_COMMUNITY): Payer: Self-pay | Admitting: Psychiatry

## 2016-11-15 VITALS — BP 131/76 | HR 83 | Ht 67.0 in | Wt 156.0 lb

## 2016-11-15 DIAGNOSIS — Z7982 Long term (current) use of aspirin: Secondary | ICD-10-CM

## 2016-11-15 DIAGNOSIS — F321 Major depressive disorder, single episode, moderate: Secondary | ICD-10-CM | POA: Diagnosis not present

## 2016-11-15 DIAGNOSIS — F5105 Insomnia due to other mental disorder: Secondary | ICD-10-CM

## 2016-11-15 DIAGNOSIS — F418 Other specified anxiety disorders: Secondary | ICD-10-CM | POA: Diagnosis not present

## 2016-11-15 DIAGNOSIS — Z79899 Other long term (current) drug therapy: Secondary | ICD-10-CM

## 2016-11-15 DIAGNOSIS — F429 Obsessive-compulsive disorder, unspecified: Secondary | ICD-10-CM | POA: Diagnosis not present

## 2016-11-15 MED ORDER — SERTRALINE HCL 100 MG PO TABS: 150.0000 mg | ORAL_TABLET | Freq: Every day | ORAL | 3 refills | Status: DC

## 2016-11-15 MED ORDER — TRAZODONE HCL 50 MG PO TABS: 50.0000 mg | ORAL_TABLET | Freq: Every day | ORAL | 3 refills | Status: DC

## 2016-11-15 NOTE — Progress Notes (Signed)
Patient ID: Cheryl Cross, female   DOB: 07-Jan-1966, 51 y.o.   MRN: [id number] Patient ID: Cheryl Cross, female   DOB: Nov 04, 1966, 51 y.o.   MRN: [id number] Patient ID: Cheryl Cross, female   DOB: 13-Dec-1965, 51 y.o.   MRN: [id number] Patient ID: Cheryl Cross, female   DOB: 05/06/1966, 51 y.o.   MRN: [id number] Patient ID: ROBINA Cross, female   DOB: [birth year], 51 y.o.   MRN: [id number] Patient ID: Cheryl Cross, female   DOB: 1966/03/17, 51 y.o.   MRN: [id number] Patient ID: Cheryl Cross, female   DOB: May 25, 1966, 51 y.o.   MRN: [id number] Patient ID: Cheryl Cross, female   DOB: 15-Jun-1966, 51 y.o.   MRN: [id number] Patient ID: Cheryl Cross, female   DOB: 1966/01/04, 51 y.o.   MRN: [id number] Patient ID: Cheryl Cross, female   DOB: 1966/03/09, 51 y.o.   MRN: [id number] [facility] MD Progress Note [id number] Cheryl Cross  MRN:  [id number] DOB:: September 18, 1966 Age: 51 y.o. Date: 11/15/2016  Chief Complaint: Chief Complaint  Patient presents with  . Depression  . Anxiety  . Follow-up   History of present illness This patient is a 51 year old separated white female who lives alone in [place]. 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 She had back pain and fibromyalgia. She got hooked on taking too many Xanax and was sleeping all the time and unable to function. [facility] and got off the Xanax and since then has been on trazodone and Zoloft. 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 She sees Dr. Jefm Miles here which she finds very helpful. She denies being depressed and stays on a good regimen of eating and sleeping. Her mood is generally been good and she's not significantly anxious. She's not abusing any substances and she denies suicidal ideation  The patient returns  after 4 months. She has been doing well. Her mood is good,and she is enjoying her job. She's not had any relapses into benzodiazepine abuse. She stays on a good regimen of sleeping and eating well.ideation. She had left flank pain and went to the ED on January 1 but offer studies were negative as was her urinalysis She thinks it may been secondary to irritable bowel syndrome Diagnosis:   Axis I: Major Depression, Recurrent severe, Substance Abuse and Substance Induced Mood Disorder Axis II: Deferred Axis III:  Past Medical History:  Diagnosis Date  . Anxiety   . Arthritis   . Bursitis   . DDD (degenerative disc disease)   . Depression   . Fibromyalgia   . Obsessive-compulsive disorder   . Overactive bladder   . 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 PTSD (post-traumatic stress disorder)   . PVC's (premature ventricular contractions)    pt. placed on heart monitor x 24hours, everything fine   . Sesamoiditis February 2017   Axis IV: other psychosocial or environmental problems Axis V: 51-60 moderate symptoms  ADL's:  Intact  Sleep: Good, thanks to Trazodone  Appetite:  Fair  Suicidal Ideation:  Pt denies any thoughts, plans, intent of suicide\ Homicidal Ideation:  Pt denies any thoughts, plans, intent of homicide  AEB (as evidenced by):per pt report  Psychiatric Specialty Exam: ROS Patient ID: Cheryl Cross, female   DOB: December 02, 1965, 51 y.o.   MRN: [id number] Patient ID:  Cheryl Cross, female   DOB: 04-06-1966, 51 y.o.   MRN: [id number] Patient ID: Cheryl Cross, female   DOB: Apr 23, 1966, 51 y.o.   MRN: [id number] [facility] MD Progress Note [id number] Cheryl Cross  MRN:  [id number] DOB:: [birth year] Age: 51 y.o. Date: 11/15/2016  Chief Complaint: Chief Complaint  Patient presents with  . Depression  . Anxiety  . Follow-up   t Diagnosis:   Axis I: Major Depression, Recurrent severe, Substance Abuse and Substance Induced Mood Disorder Axis II:  Deferred Axis III:  Past Medical History:  Diagnosis Date  . Anxiety   . Arthritis   . Bursitis   . DDD (degenerative disc disease)   . Depression   . Fibromyalgia   . Obsessive-compulsive disorder   . Overactive bladder   . 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 PTSD (post-traumatic stress disorder)   . PVC's (premature ventricular contractions)    pt. placed on heart monitor x 24hours, everything fine   . Sesamoiditis February 2017   Axis IV: other psychosocial or environmental problems Axis V: 51-60 moderate symptoms  ADL's:  Intact  Sleep: Good, thanks to Trazodone  Appetite:  Fair  Suicidal Ideation:  Pt denies any thoughts, plans, intent of suicide\ Homicidal Ideation:  Pt denies any thoughts, plans, intent of homicide  AEB (as evidenced by):per pt report  Psychiatric Specialty Exam: ROS  Blood pressure 131/76, pulse 83, height 5\' 7"  (1.702 m), weight 156 lb (70.8 kg), SpO2 96 %.Body mass index is 24.43 kg/m.  General Appearance: Casual  Eye Contact::  Good  Speech:  Clear and Coherent  Volume:  Normal  Mood: good   Affect:  Congruent  Thought Process:  Coherent, Linear and Logical  Orientation:  Full (Time, Place, and Person)  Thought Content:  WDL  Suicidal Thoughts:  No  Homicidal Thoughts:  No  Memory:  Immediate;   Good Recent;   Good Remote;   Good  Judgement:  Good  Insight:  Good  Psychomotor Activity: Normal   Concentration:  Good  Recall:  Good  Akathisia:  No  Handed:  Right  AIMS (if indicated):     Assets:  Communication Skills Desire for Improvement  Sleep:      Current Medications: Current Outpatient Prescriptions  Medication Sig Dispense Refill  . aspirin 81 MG tablet Take 1 tablet (81 mg total) by mouth daily. For Polycythemia Vera.    Marland Kitchen ketorolac (ACULAR) 0.5 % ophthalmic solution Place 1 drop into both eyes daily as needed.    . sertraline (ZOLOFT) 100 MG tablet Take 1.5 tablets (150 mg total) by mouth daily. 45 tablet 3   . traZODone (DESYREL) 50 MG tablet Take 1 tablet (50 mg total) by mouth at bedtime. For sleep. 30 tablet 3   No current facility-administered medications for this visit.    Lab Results:  Results for orders placed or performed during the hospital encounter of 11/12/16 (from the past 8736 hour(s))  Urinalysis, Routine w reflex microscopic   Collection Time: 11/12/16 11:03 AM  Result Value Ref Range   Color, Urine YELLOW YELLOW   APPearance CLEAR CLEAR   Specific Gravity, Urine <1.005 (L) 1.005 - 1.030   pH 5.5 5.0 - 8.0   Glucose, UA NEGATIVE NEGATIVE mg/dL   Hgb urine dipstick NEGATIVE NEGATIVE   Bilirubin Urine NEGATIVE NEGATIVE   Ketones, ur NEGATIVE NEGATIVE mg/dL   Protein, ur NEGATIVE NEGATIVE mg/dL   Nitrite NEGATIVE NEGATIVE   Leukocytes, UA NEGATIVE NEGATIVE  CBC  with Differential   Collection Time: 11/12/16 11:46 AM  Result Value Ref Range   WBC 5.7 4.0 - 10.5 K/uL   RBC 4.44 3.87 - 5.11 MIL/uL   Hemoglobin 12.7 12.0 - 15.0 g/dL   HCT 39.1 36.0 - 46.0 %   MCV 88.1 78.0 - 100.0 fL   MCH 28.6 26.0 - 34.0 pg   MCHC 32.5 30.0 - 36.0 g/dL   RDW 15.6 (H) 11.5 - 15.5 %   Platelets 321 150 - 400 K/uL   Neutrophils Relative % 58 %   Neutro Abs 3.3 1.7 - 7.7 K/uL   Lymphocytes Relative 30 %   Lymphs Abs 1.7 0.7 - 4.0 K/uL   Monocytes Relative 9 %   Monocytes Absolute 0.5 0.1 - 1.0 K/uL   Eosinophils Relative 2 %   Eosinophils Absolute 0.1 0.0 - 0.7 K/uL   Basophils Relative 1 %   Basophils Absolute 0.0 0.0 - 0.1 K/uL  Lipase, blood   Collection Time: 11/12/16 11:46 AM  Result Value Ref Range   Lipase 36 11 - 51 U/L  Comprehensive metabolic panel   Collection Time: 11/12/16 11:46 AM  Result Value Ref Range   Sodium 140 135 - 145 mmol/L   Potassium 3.6 3.5 - 5.1 mmol/L   Chloride 108 101 - 111 mmol/L   CO2 27 22 - 32 mmol/L   Glucose, Bld 87 65 - 99 mg/dL   BUN <5 (L) 6 - 20 mg/dL   Creatinine, Ser 0.65 0.44 - 1.00 mg/dL   Calcium 9.3 8.9 - 10.3 mg/dL   Total  Protein 7.2 6.5 - 8.1 g/dL   Albumin 4.2 3.5 - 5.0 g/dL   AST 14 (L) 15 - 41 U/L   ALT 16 14 - 54 U/L   Alkaline Phosphatase 60 38 - 126 U/L   Total Bilirubin 0.3 0.3 - 1.2 mg/dL   GFR calc non Af Amer >60 >60 mL/min   GFR calc Af Amer >60 >60 mL/min   Anion gap 5 5 - 15  Results for orders placed or performed during the hospital encounter of 11/01/16 (from the past 8736 hour(s))  Hemoglobin   Collection Time: 11/01/16 11:11 AM  Result Value Ref Range   Hemoglobin 13.9 12.0 - 15.0 g/dL  Results for orders placed or performed during the hospital encounter of 06/22/16 (from the past 8736 hour(s))  Hemoglobin   Collection Time: 06/22/16 11:59 AM  Result Value Ref Range   Hemoglobin 12.1 12.0 - 15.0 g/dL  Results for orders placed or performed during the hospital encounter of 05/29/16 (from the past 8736 hour(s))  ECHOCARDIOGRAM COMPLETE   Collection Time: 05/29/16  1:36 PM  Result Value Ref Range   LV PW d 8.11 (A) 0.6 - 1.1 mm   FS 33 28 - 44 %   LA vol 43.3 mL   LA ID, A-P, ES 30 mm   IVS/LV PW RATIO, ED 1    Stroke v 32 ml   LVOT VTI 21.4 cm   LV e' LATERAL 13.3 cm/s   LV E/e' medial 6.49    LV E/e'average 6.49    LA diam index 1.63 cm/m2   LA vol A4C 35.4 ml   LVOT peak grad rest 4 mmHg   E decel time 197 msec   LVOT diameter 18 mm   LVOT area 2.54 cm2   LVOT peak vel 102 cm/s   LVOT SV 54 mL   Peak grad 3 mmHg   E/e' ratio  6.49    MV pk E vel 86.3 m/s   MV pk A vel 62.9 m/s   LV sys vol 16 14 - 42 mL   LV sys vol index 9 mL/m2   LV dias vol 48 46 - 106 mL   LV dias vol index 26 mL/m2   LA vol index 23.6 mL/m2   MV Dec 197    LA diam end sys 30 mm   Simpson's disk 67    TDI e' medial 10.1    TDI e' lateral 13.3    Lateral S' vel 13.1 cm/sec   TAPSE 21.1 mm  Results for orders placed or performed in visit on 05/28/16 (from the past 8736 hour(s))  TSH   Collection Time: 05/28/16 10:01 AM  Result Value Ref Range   TSH 1.490 0.450 - 4.500 uIU/mL  T4, Free    Collection Time: 05/28/16 10:01 AM  Result Value Ref Range   Free T4 1.06 0.82 - 1.77 ng/dL  Results for orders placed or performed in visit on 05/21/16 (from the past 8736 hour(s))  CBC with Differential/Platelet   Collection Time: 05/21/16 10:15 AM  Result Value Ref Range   WBC 7.0 3.4 - 10.8 x10E3/uL   RBC 4.38 3.77 - 5.28 x10E6/uL   Hemoglobin 12.1 11.1 - 15.9 g/dL   Hematocrit 37.9 34.0 - 46.6 %   MCV 87 79 - 97 fL   MCH 27.6 26.6 - 33.0 pg   MCHC 31.9 31.5 - 35.7 g/dL   RDW 15.8 (H) 12.3 - 15.4 %   Platelets 303 150 - 379 x10E3/uL   Neutrophils 66 %   Lymphs 24 %   Monocytes 8 %   Eos 2 %   Basos 0 %   Neutrophils Absolute 4.6 1.4 - 7.0 x10E3/uL   Lymphocytes Absolute 1.7 0.7 - 3.1 x10E3/uL   Monocytes Absolute 0.6 0.1 - 0.9 x10E3/uL   EOS (ABSOLUTE) 0.1 0.0 - 0.4 x10E3/uL   Basophils Absolute 0.0 0.0 - 0.2 x10E3/uL   Immature Granulocytes 0 %   Immature Grans (Abs) 0.0 0.0 - 0.1 x10E3/uL  Comprehensive metabolic panel   Collection Time: 05/21/16 10:15 AM  Result Value Ref Range   Glucose 79 65 - 99 mg/dL   BUN 6 6 - 24 mg/dL   Creatinine, Ser 0.67 0.57 - 1.00 mg/dL   GFR calc non Af Amer 104 >59 mL/min/1.73   GFR calc Af Amer 120 >59 mL/min/1.73   BUN/Creatinine Ratio 9 9 - 23   Sodium 138 134 - 144 mmol/L   Potassium 4.1 3.5 - 5.2 mmol/L   Chloride 100 96 - 106 mmol/L   CO2 21 18 - 29 mmol/L   Calcium 8.8 8.7 - 10.2 mg/dL   Total Protein 6.3 6.0 - 8.5 g/dL   Albumin 4.2 3.5 - 5.5 g/dL   Globulin, Total 2.1 1.5 - 4.5 g/dL   Albumin/Globulin Ratio 2.0 1.2 - 2.2   Bilirubin Total <0.2 0.0 - 1.2 mg/dL   Alkaline Phosphatase 60 39 - 117 IU/L   AST 13 0 - 40 IU/L   ALT 9 0 - 32 IU/L  Ferritin   Collection Time: 05/21/16 10:15 AM  Result Value Ref Range   Ferritin 9 (L) 15 - 150 ng/mL  Results for orders placed or performed during the hospital encounter of 05/20/16 (from the past 8736 hour(s))  CBC with Differential   Collection Time: 05/20/16  1:41 PM   Result Value Ref Range   WBC 8.8  4.0 - 10.5 K/uL   RBC 4.47 3.87 - 5.11 MIL/uL   Hemoglobin 12.8 12.0 - 15.0 g/dL   HCT 39.1 36.0 - 46.0 %   MCV 87.5 78.0 - 100.0 fL   MCH 28.6 26.0 - 34.0 pg   MCHC 32.7 30.0 - 36.0 g/dL   RDW 14.6 11.5 - 15.5 %   Platelets 281 150 - 400 K/uL   Neutrophils Relative % 64 %   Neutro Abs 5.6 1.7 - 7.7 K/uL   Lymphocytes Relative 27 %   Lymphs Abs 2.4 0.7 - 4.0 K/uL   Monocytes Relative 7 %   Monocytes Absolute 0.7 0.1 - 1.0 K/uL   Eosinophils Relative 2 %   Eosinophils Absolute 0.2 0.0 - 0.7 K/uL   Basophils Relative 0 %   Basophils Absolute 0.0 0.0 - 0.1 K/uL  Comprehensive metabolic panel   Collection Time: 05/20/16  1:41 PM  Result Value Ref Range   Sodium 138 135 - 145 mmol/L   Potassium 3.2 (L) 3.5 - 5.1 mmol/L   Chloride 104 101 - 111 mmol/L   CO2 27 22 - 32 mmol/L   Glucose, Bld 85 65 - 99 mg/dL   BUN 5 (L) 6 - 20 mg/dL   Creatinine, Ser 0.57 0.44 - 1.00 mg/dL   Calcium 8.8 (L) 8.9 - 10.3 mg/dL   Total Protein 7.3 6.5 - 8.1 g/dL   Albumin 4.2 3.5 - 5.0 g/dL   AST 16 15 - 41 U/L   ALT 12 (L) 14 - 54 U/L   Alkaline Phosphatase 60 38 - 126 U/L   Total Bilirubin 0.3 0.3 - 1.2 mg/dL   GFR calc non Af Amer >60 >60 mL/min   GFR calc Af Amer >60 >60 mL/min   Anion gap 7 5 - 15  Troponin I   Collection Time: 05/20/16  1:41 PM  Result Value Ref Range   Troponin I <0.03 <0.03 ng/mL  Results for orders placed or performed during the hospital encounter of 03/09/16 (from the past 8736 hour(s))  Hemoglobin   Collection Time: 03/09/16  2:20 PM  Result Value Ref Range   Hemoglobin 12.2 12.0 - 15.0 g/dL   Physical Findings: AIMS:  , ,  ,  ,    CIWA:    COWS:     Treatment Plan Summary: Medication management  Plan: I will continue Zoloft or depression and trazodone for sleep.  Recommend to call us back if she has any question concerns or if she feels worsening symptoms.  Followup in 4 months.  MEDICATIONS this encounter: Meds ordered  this encounter  Medications  . traZODone (DESYREL) 50 MG tablet    Sig: Take 1 tablet (50 mg total) by mouth at bedtime. For sleep.    Dispense:  30 tablet    Refill:  3  . sertraline (ZOLOFT) 100 MG tablet    Sig: Take 1.5 tablets (150 mg total) by mouth daily.    Dispense:  45 tablet    Refill:  3    Medical Decision Making Problem Points:  Established problem, stable/improving (1) and Review of last therapy session (1) Data Points:  Review or order clinical lab tests (1) Review of medication regiment & side effects (2)  Javonne Louissaint, [place] 11/15/2016, 4:46 PM

## 2016-12-19 ENCOUNTER — Other Ambulatory Visit (HOSPITAL_COMMUNITY): Payer: Self-pay | Admitting: Internal Medicine

## 2016-12-19 DIAGNOSIS — R222 Localized swelling, mass and lump, trunk: Secondary | ICD-10-CM

## 2017-02-05 ENCOUNTER — Ambulatory Visit (HOSPITAL_COMMUNITY)
Admission: RE | Admit: 2017-02-05 | Discharge: 2017-02-05 | Disposition: A | Discharge status: Home / Self Care | Payer: BC Managed Care – PPO | Source: Ambulatory Visit | Attending: Internal Medicine | Admitting: Internal Medicine

## 2017-02-05 ENCOUNTER — Encounter (INDEPENDENT_AMBULATORY_CARE_PROVIDER_SITE_OTHER): Payer: Self-pay | Admitting: Internal Medicine

## 2017-02-05 ENCOUNTER — Ambulatory Visit (INDEPENDENT_AMBULATORY_CARE_PROVIDER_SITE_OTHER): Payer: BC Managed Care – PPO | Admitting: Internal Medicine

## 2017-02-05 VITALS — BP 110/72 | HR 72 | Temp 98.7°F | Ht 67.0 in | Wt 155.5 lb

## 2017-02-05 DIAGNOSIS — R197 Diarrhea, unspecified: Secondary | ICD-10-CM | POA: Diagnosis not present

## 2017-02-05 IMAGING — DX DG ABDOMEN 1V
1 series · 1 of 1 positions shown · non-contrast
Comparison: CT [DATE].  Radiographs [DATE].

CLINICAL DATA: Sharp, stabbing left abdominal pain with bloating
and discomfort for 4 days. History of irritable bowel syndrome and
hysterectomy.

EXAM:
ABDOMEN - 1 VIEW

[abdomen kub]
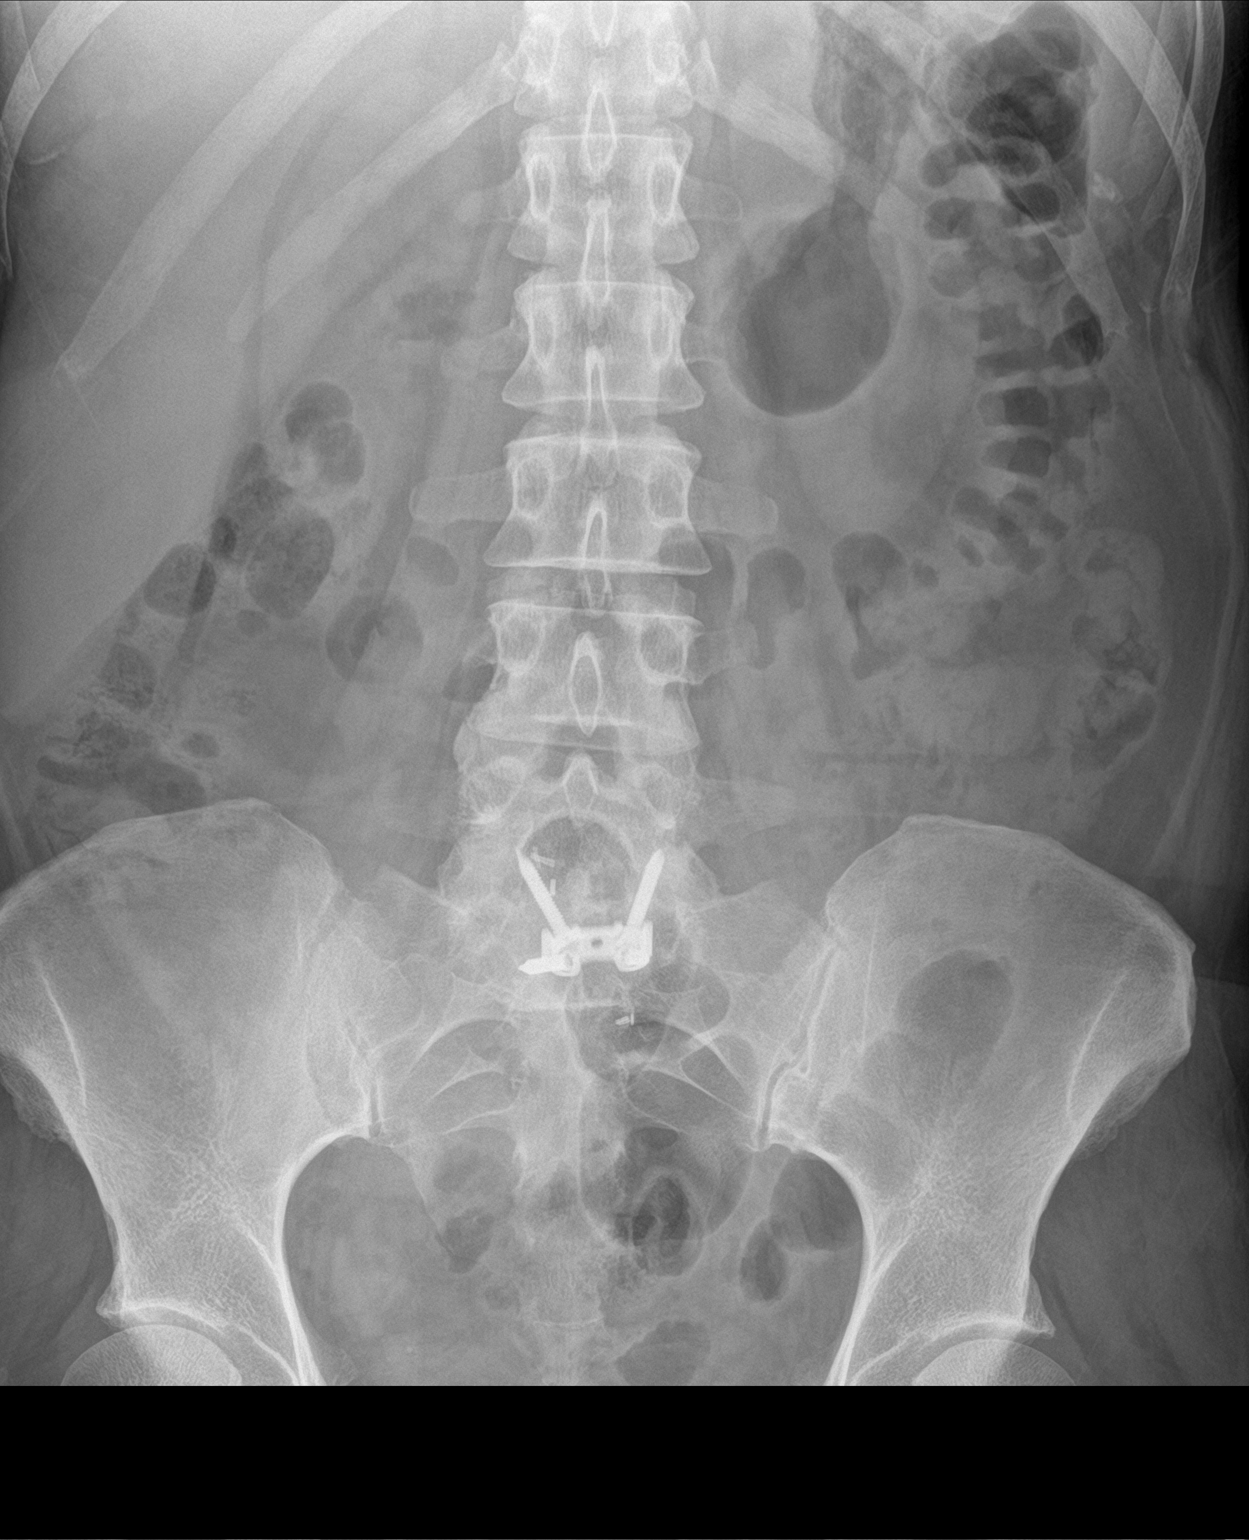

[1 of 1 positions shown; findings below may reference images not displayed]

FINDINGS: The bowel gas pattern is normal. There is no supine evidence of free
intraperitoneal air. Small pelvic calcifications are grossly stable.
Stable postsurgical changes status post anterior fusion at L5-S1. No
acute osseous findings are seen.
IMPRESSION: No active abdominal process demonstrated.

## 2017-02-05 NOTE — Patient Instructions (Signed)
GI pathogen. Further recommendations to follow.  

## 2017-02-05 NOTE — Progress Notes (Signed)
Subjective:    Patient ID: Cheryl Cross, female    DOB: 1966-02-14, 51 y.o.   MRN: [id number]  HPIPresents today with c/o bloating, pain in her flanks.  She says her stools are thin and are watery.  She says her stools are smaller. Since Sunday. She says Friday, she felt like she was getting a virus.  She was seen by [facility] yesterday and was referred to the ED. Hx of IBS. There has been no fever. She has had some chills. Has not been hungry for the past few days. She is having 3-4 stools. She feels like it is incomplete evacuation.  She says she had similar symptoms in January. She was seen in the ED.  11/21/2016 DT abdomen/lpelvis with CM: flank pain: IMPRESSION: Normal CT of the abdomen and pelvis. 02/23/2010 Colonoscopy:  ENDOSCOPIC IMPRESSION:  1) Normal colon  RECOMMENDATIONS:  1) Await biopsy results  2) call office next 1-3 days to schedule followup visit in 2  weeks  Biopsy:   1. COLON, BIOPSY, RANDOM : BENIGN COLONIC MUCOSA. NO MICROSCOPIC COLITIS, ACTIVE INFLAMMATION OR GRANULOMAS.   Review of Systems Past Medical History:  Diagnosis Date  . Anxiety   . Arthritis   . Bursitis   . DDD (degenerative disc disease)   . Depression   . Fibromyalgia   . Obsessive-compulsive disorder   . Overactive bladder   . 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 PTSD (post-traumatic stress disorder)   . PVC's (premature ventricular contractions)    pt. placed on heart monitor x 24hours, everything fine   . Sesamoiditis February 2017    Past Surgical History:  Procedure Laterality Date  . ABDOMINAL HYSTERECTOMY    . ESOPHAGOGASTRODUODENOSCOPY N/A 09/10/2014   Procedure: ESOPHAGOGASTRODUODENOSCOPY (EGD);  Surgeon: Rogene Houston, MD;  Location: [facility];  Service: Endoscopy;  Laterality: N/A;  130 - moved to 3:15 - Ann to notify  . LUMBAR FUSION    . TOOTH EXTRACTION Left Aug 2016    Allergies  Allergen Reactions  . Cyclobenzaprine Other (See Comments)    Hot  sensation  . Diclofenac Sodium Itching  . Gabapentin Hives  . Penicillins Other (See Comments)    Unknown child hood reaction  . Robaxin [Methocarbamol] Hives  . Voltaren [Diclofenac Sodium] Itching    Current Outpatient Prescriptions on File Prior to Visit  Medication Sig Dispense Refill  . aspirin 81 MG tablet Take 1 tablet (81 mg total) by mouth daily. For Polycythemia Vera.    Marland Kitchen ketorolac (ACULAR) 0.5 % ophthalmic solution Place 1 drop into both eyes daily as needed.    . sertraline (ZOLOFT) 100 MG tablet Take 1.5 tablets (150 mg total) by mouth daily. 45 tablet 3  . traZODone (DESYREL) 50 MG tablet Take 1 tablet (50 mg total) by mouth at bedtime. For sleep. 30 tablet 3   No current facility-administered medications on file prior to visit.        Objective:   Physical Exam Blood pressure 110/72, pulse 72, temperature 98.7 F (37.1 C), height 5\' 7"  (1.702 m), weight 155 lb 8 oz (70.5 kg).  Alert and oriented. Skin warm and dry. Oral mucosa is moist.   . Sclera anicteric, conjunctivae is pink. Thyroid not enlarged. No cervical lymphadenopathy. Lungs clear. Heart regular rate and rhythm.  Abdomen is soft. Bowel sounds are positive. No hepatomegaly. No abdominal masses felt. No tenderness.  No edema to lower extremities.         Assessment & Plan:  Diarrhea. Am going to get GI pathogen. Further recommendations to follow.  KUB

## 2017-02-15 LAB — GASTROINTESTINAL PATHOGEN PANEL PCR

## 2017-02-21 ENCOUNTER — Other Ambulatory Visit: Payer: Self-pay | Admitting: Oncology

## 2017-02-21 DIAGNOSIS — D45 Polycythemia vera: Secondary | ICD-10-CM

## 2017-03-08 ENCOUNTER — Encounter (HOSPITAL_COMMUNITY)
Admission: RE | Admit: 2017-03-08 | Discharge: 2017-03-08 | Disposition: A | Discharge status: Home / Self Care | Payer: BC Managed Care – PPO | Source: Ambulatory Visit | Attending: Oncology | Admitting: Oncology

## 2017-03-08 ENCOUNTER — Encounter (HOSPITAL_COMMUNITY): Payer: Self-pay

## 2017-03-08 DIAGNOSIS — D45 Polycythemia vera: Secondary | ICD-10-CM | POA: Diagnosis present

## 2017-03-08 LAB — HEMOGLOBIN: Hemoglobin: 12.6 g/dL (ref 12.0–15.0)

## 2017-03-08 NOTE — Progress Notes (Signed)
Cheryl Cross presents today for phlebotomy per MD orders. HGB- 12.6 Phlebotomy procedure started at  1125 and ended at 1140. 500cc removed. Patient tolerated procedure well. IV needle removed intact.

## 2017-03-08 NOTE — Procedures (Deleted)
No note

## 2017-03-14 ENCOUNTER — Ambulatory Visit (INDEPENDENT_AMBULATORY_CARE_PROVIDER_SITE_OTHER): Payer: BC Managed Care – PPO | Admitting: Psychiatry

## 2017-03-14 ENCOUNTER — Ambulatory Visit (HOSPITAL_COMMUNITY): Payer: Self-pay | Admitting: Psychiatry

## 2017-03-14 ENCOUNTER — Encounter (HOSPITAL_COMMUNITY): Payer: Self-pay | Admitting: Psychiatry

## 2017-03-14 VITALS — BP 129/77 | HR 71 | Ht 67.0 in | Wt 154.0 lb

## 2017-03-14 DIAGNOSIS — F1994 Other psychoactive substance use, unspecified with psychoactive substance-induced mood disorder: Secondary | ICD-10-CM | POA: Diagnosis not present

## 2017-03-14 DIAGNOSIS — Z79899 Other long term (current) drug therapy: Secondary | ICD-10-CM | POA: Diagnosis not present

## 2017-03-14 DIAGNOSIS — F5105 Insomnia due to other mental disorder: Secondary | ICD-10-CM

## 2017-03-14 DIAGNOSIS — F321 Major depressive disorder, single episode, moderate: Secondary | ICD-10-CM

## 2017-03-14 DIAGNOSIS — Z7982 Long term (current) use of aspirin: Secondary | ICD-10-CM | POA: Diagnosis not present

## 2017-03-14 DIAGNOSIS — F429 Obsessive-compulsive disorder, unspecified: Secondary | ICD-10-CM

## 2017-03-14 DIAGNOSIS — F418 Other specified anxiety disorders: Secondary | ICD-10-CM | POA: Diagnosis not present

## 2017-03-14 MED ORDER — TRAZODONE HCL 50 MG PO TABS: 50.0000 mg | ORAL_TABLET | Freq: Every day | ORAL | 3 refills | Status: DC

## 2017-03-14 MED ORDER — SERTRALINE HCL 100 MG PO TABS: 150.0000 mg | ORAL_TABLET | Freq: Every day | ORAL | 3 refills | Status: DC

## 2017-03-14 NOTE — Progress Notes (Signed)
Patient ID: Cheryl Cross, female   DOB: [birth year], 51 y.o.   MRN: [id number] Patient ID: Cheryl Cross, female   DOB: 1966/01/04, 51 y.o.   MRN: [id number] Patient ID: Cheryl Cross, female   DOB: Jun 13, 1966, 51 y.o.   MRN: [id number] Patient ID: Cheryl Cross, female   DOB: 1966-02-16, 51 y.o.   MRN: [id number] Patient ID: Cheryl Cross, female   DOB: May 20, 1966, 51 y.o.   MRN: [id number] Patient ID: Cheryl Cross, female   DOB: 1966-03-21, 51 y.o.   MRN: [id number] Patient ID: Cheryl Cross, female   DOB: May 22, 1966, 51 y.o.   MRN: [id number] Patient ID: Cheryl Cross, female   DOB: Mar 10, 1966, 51 y.o.   MRN: [id number] Patient ID: Cheryl Cross, female   DOB: May 03, 1966, 51 y.o.   MRN: [id number] Patient ID: Cheryl Cross, female   DOB: 1966-01-25, 51 y.o.   MRN: [id number] [facility] MD Progress Note [id number] Cheryl Cross  MRN:  [id number] DOB:: May 08, 1966 Age: 51 y.o. Date: 03/14/2017  Chief Complaint: Chief Complaint  Patient presents with  . Depression  . Anxiety  . Follow-up   History of present illness This patient is a 51 year old separated white female who lives alone in [place]. 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 She had back pain and fibromyalgia. She got hooked on taking too many Xanax and was sleeping all the time and unable to function. [facility] and got off the Xanax and since then has been on trazodone and Zoloft. 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 She sees Dr. Jefm Miles here which she finds very helpful. She denies being depressed and stays on a good regimen of eating and sleeping. Her mood is generally been good and she's not significantly anxious. She's not abusing any substances and she denies suicidal ideation  The patient returns  after 4 months. She has been doing well. Her mood is good,and she is enjoying her job. She's not had any relapses into benzodiazepine abuse. She stays on a good regimen of sleeping and eating well.ideation. She states that she does quite well as long as she takes her medications. Diagnosis:   Axis I: Major Depression, Recurrent severe, Substance Abuse and Substance Induced Mood Disorder Axis II: Deferred Axis III:  Past Medical History:  Diagnosis Date  . Anxiety   . Arthritis   . Bursitis   . DDD (degenerative disc disease)   . Depression   . Fibromyalgia   . Obsessive-compulsive disorder   . Overactive bladder   . 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 PTSD (post-traumatic stress disorder)   . PVC's (premature ventricular contractions)    pt. placed on heart monitor x 24hours, everything fine   . Sesamoiditis February 2017   Axis IV: other psychosocial or environmental problems Axis V: 51-60 moderate symptoms  ADL's:  Intact  Sleep: Good, thanks to Trazodone  Appetite:  Fair  Suicidal Ideation:  Pt denies any thoughts, plans, intent of suicide\ Homicidal Ideation:  Pt denies any thoughts, plans, intent of homicide  AEB (as evidenced by):per pt report  Psychiatric Specialty Exam: ROS Patient ID: Cheryl Cross, female   DOB: [birth year], 51 y.o.   MRN: [id number] Patient ID: Cheryl Cross, female   DOB: 26-Sep-1966, 51 y.o.   MRN: [id number] Patient ID: Cheryl Cross, female   DOB: [birth year], 51 y.o.   MRN: [id number] [facility] MD Progress Note [id number] Cheryl Cross  MRN:  [id number] DOB:: 1966/04/18 Age: 51 y.o. Date: 03/14/2017  Chief Complaint: Chief Complaint  Patient presents with  . Depression  . Anxiety  . Follow-up   t Diagnosis:   Axis I: Major Depression, Recurrent severe, Substance Abuse and Substance Induced Mood Disorder Axis II: Deferred Axis III:  Past Medical History:  Diagnosis Date  . Anxiety   . Arthritis   . Bursitis   .  DDD (degenerative disc disease)   . Depression   . Fibromyalgia   . Obsessive-compulsive disorder   . Overactive bladder   . 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 PTSD (post-traumatic stress disorder)   . PVC's (premature ventricular contractions)    pt. placed on heart monitor x 24hours, everything fine   . Sesamoiditis February 2017   Axis IV: other psychosocial or environmental problems Axis V: 51-60 moderate symptoms  ADL's:  Intact  Sleep: Good, thanks to Trazodone  Appetite:  Fair  Suicidal Ideation:  Pt denies any thoughts, plans, intent of suicide\ Homicidal Ideation:  Pt denies any thoughts, plans, intent of homicide  AEB (as evidenced by):per pt report  Psychiatric Specialty Exam: ROS  Blood pressure 129/77, pulse 71, height 5\' 7"  (1.702 m), weight 154 lb (69.9 kg).Body mass index is 24.12 kg/m.  General Appearance: Casual  Eye Contact::  Good  Speech:  Clear and Coherent  Volume:  Normal  Mood: good   Affect:  Congruent  Thought Process:  Coherent, Linear and Logical  Orientation:  Full (Time, Place, and Person)  Thought Content:  WDL  Suicidal Thoughts:  No  Homicidal Thoughts:  No  Memory:  Immediate;   Good Recent;   Good Remote;   Good  Judgement:  Good  Insight:  Good  Psychomotor Activity: Normal   Concentration:  Good  Recall:  Good  Akathisia:  No  Handed:  Right  AIMS (if indicated):     Assets:  Communication Skills Desire for Improvement  Sleep:      Current Medications: Current Outpatient Prescriptions  Medication Sig Dispense Refill  . aspirin 81 MG tablet Take 1 tablet (81 mg total) by mouth daily. For Polycythemia Vera.    Marland Kitchen ketorolac (ACULAR) 0.5 % ophthalmic solution Place 1 drop into both eyes daily as needed.    . sertraline (ZOLOFT) 100 MG tablet Take 1.5 tablets (150 mg total) by mouth daily. 45 tablet 3  . traZODone (DESYREL) 50 MG tablet Take 1 tablet (50 mg total) by mouth at bedtime. For sleep. 30 tablet 3   No  current facility-administered medications for this visit.    Lab Results:  Results for orders placed or performed during the hospital encounter of 03/08/17 (from the past 8736 hour(s))  Hemoglobin   Collection Time: 03/08/17 10:55 AM  Result Value Ref Range   Hemoglobin 12.6 12.0 - 15.0 g/dL  Results for orders placed or performed in visit on 02/05/17 (from the past 8736 hour(s))  Gastrointestinal Pathogen Panel PCR   Collection Time: 02/08/17  1:23 PM  Result Value Ref Range   Campylobacter, PCR Indeterm (A)    C. difficile Tox A/B, PCR Indeterm (A)    E coli 0157, PCR Indeterm (A)    E coli (ETEC) LT/ST PCR Indeterm (A)    E coli (STEC) stx1/stx2, PCR Indeterm (A)    Salmonella, PCR Indeterm (A)    Shigella, PCR Indeterm (  A)    Norovirus, PCR Indeterm (A)    Rotavirus A, PCR Indeterm (A)    Giardia lamblia, PCR Indeterm (A)    Cryptosporidium, PCR Indeterm (A)   Results for orders placed or performed during the hospital encounter of 11/12/16 (from the past 8736 hour(s))  Urinalysis, Routine w reflex microscopic   Collection Time: 11/12/16 11:03 AM  Result Value Ref Range   Color, Urine YELLOW YELLOW   APPearance CLEAR CLEAR   Specific Gravity, Urine <1.005 (L) 1.005 - 1.030   pH 5.5 5.0 - 8.0   Glucose, UA NEGATIVE NEGATIVE mg/dL   Hgb urine dipstick NEGATIVE NEGATIVE   Bilirubin Urine NEGATIVE NEGATIVE   Ketones, ur NEGATIVE NEGATIVE mg/dL   Protein, ur NEGATIVE NEGATIVE mg/dL   Nitrite NEGATIVE NEGATIVE   Leukocytes, UA NEGATIVE NEGATIVE  CBC with Differential   Collection Time: 11/12/16 11:46 AM  Result Value Ref Range   WBC 5.7 4.0 - 10.5 K/uL   RBC 4.44 3.87 - 5.11 MIL/uL   Hemoglobin 12.7 12.0 - 15.0 g/dL   HCT 39.1 36.0 - 46.0 %   MCV 88.1 78.0 - 100.0 fL   MCH 28.6 26.0 - 34.0 pg   MCHC 32.5 30.0 - 36.0 g/dL   RDW 15.6 (H) 11.5 - 15.5 %   Platelets 321 150 - 400 K/uL   Neutrophils Relative % 58 %   Neutro Abs 3.3 1.7 - 7.7 K/uL   Lymphocytes Relative 30 %    Lymphs Abs 1.7 0.7 - 4.0 K/uL   Monocytes Relative 9 %   Monocytes Absolute 0.5 0.1 - 1.0 K/uL   Eosinophils Relative 2 %   Eosinophils Absolute 0.1 0.0 - 0.7 K/uL   Basophils Relative 1 %   Basophils Absolute 0.0 0.0 - 0.1 K/uL  Lipase, blood   Collection Time: 11/12/16 11:46 AM  Result Value Ref Range   Lipase 36 11 - 51 U/L  Comprehensive metabolic panel   Collection Time: 11/12/16 11:46 AM  Result Value Ref Range   Sodium 140 135 - 145 mmol/L   Potassium 3.6 3.5 - 5.1 mmol/L   Chloride 108 101 - 111 mmol/L   CO2 27 22 - 32 mmol/L   Glucose, Bld 87 65 - 99 mg/dL   BUN <5 (L) 6 - 20 mg/dL   Creatinine, Ser 0.65 0.44 - 1.00 mg/dL   Calcium 9.3 8.9 - 10.3 mg/dL   Total Protein 7.2 6.5 - 8.1 g/dL   Albumin 4.2 3.5 - 5.0 g/dL   AST 14 (L) 15 - 41 U/L   ALT 16 14 - 54 U/L   Alkaline Phosphatase 60 38 - 126 U/L   Total Bilirubin 0.3 0.3 - 1.2 mg/dL   GFR calc non Af Amer >60 >60 mL/min   GFR calc Af Amer >60 >60 mL/min   Anion gap 5 5 - 15  Results for orders placed or performed during the hospital encounter of 11/01/16 (from the past 8736 hour(s))  Hemoglobin   Collection Time: 11/01/16 11:11 AM  Result Value Ref Range   Hemoglobin 13.9 12.0 - 15.0 g/dL  Results for orders placed or performed during the hospital encounter of 06/22/16 (from the past 8736 hour(s))  Hemoglobin   Collection Time: 06/22/16 11:59 AM  Result Value Ref Range   Hemoglobin 12.1 12.0 - 15.0 g/dL  Results for orders placed or performed during the hospital encounter of 05/29/16 (from the past 8736 hour(s))  ECHOCARDIOGRAM COMPLETE   Collection Time: 05/29/16  1:36  PM  Result Value Ref Range   LV PW d 8.11 (A) 0.6 - 1.1 mm   FS 33 28 - 44 %   LA vol 43.3 mL   LA ID, A-P, ES 30 mm   IVS/LV PW RATIO, ED 1    Stroke v 32 ml   LVOT VTI 21.4 cm   LV e' LATERAL 13.3 cm/s   LV E/e' medial 6.49    LV E/e'average 6.49    LA diam index 1.63 cm/m2   LA vol A4C 35.4 ml   LVOT peak grad rest 4 mmHg   E  decel time 197 msec   LVOT diameter 18 mm   LVOT area 2.54 cm2   LVOT peak vel 102 cm/s   LVOT SV 54 mL   Peak grad 3 mmHg   E/e' ratio 6.49    MV pk E vel 86.3 m/s   MV pk A vel 62.9 m/s   LV sys vol 16 14 - 42 mL   LV sys vol index 9 mL/m2   LV dias vol 48 46 - 106 mL   LV dias vol index 26 mL/m2   LA vol index 23.6 mL/m2   MV Dec 197    LA diam end sys 30 mm   Simpson's disk 67    TDI e' medial 10.1    TDI e' lateral 13.3    Lateral S' vel 13.1 cm/sec   TAPSE 21.1 mm  Results for orders placed or performed in visit on 05/28/16 (from the past 8736 hour(s))  TSH   Collection Time: 05/28/16 10:01 AM  Result Value Ref Range   TSH 1.490 0.450 - 4.500 uIU/mL  T4, Free   Collection Time: 05/28/16 10:01 AM  Result Value Ref Range   Free T4 1.06 0.82 - 1.77 ng/dL  Results for orders placed or performed in visit on 05/21/16 (from the past 8736 hour(s))  CBC with Differential/Platelet   Collection Time: 05/21/16 10:15 AM  Result Value Ref Range   WBC 7.0 3.4 - 10.8 x10E3/uL   RBC 4.38 3.77 - 5.28 x10E6/uL   Hemoglobin 12.1 11.1 - 15.9 g/dL   Hematocrit 37.9 34.0 - 46.6 %   MCV 87 79 - 97 fL   MCH 27.6 26.6 - 33.0 pg   MCHC 31.9 31.5 - 35.7 g/dL   RDW 15.8 (H) 12.3 - 15.4 %   Platelets 303 150 - 379 x10E3/uL   Neutrophils 66 %   Lymphs 24 %   Monocytes 8 %   Eos 2 %   Basos 0 %   Neutrophils Absolute 4.6 1.4 - 7.0 x10E3/uL   Lymphocytes Absolute 1.7 0.7 - 3.1 x10E3/uL   Monocytes Absolute 0.6 0.1 - 0.9 x10E3/uL   EOS (ABSOLUTE) 0.1 0.0 - 0.4 x10E3/uL   Basophils Absolute 0.0 0.0 - 0.2 x10E3/uL   Immature Granulocytes 0 %   Immature Grans (Abs) 0.0 0.0 - 0.1 x10E3/uL  Comprehensive metabolic panel   Collection Time: 05/21/16 10:15 AM  Result Value Ref Range   Glucose 79 65 - 99 mg/dL   BUN 6 6 - 24 mg/dL   Creatinine, Ser 0.67 0.57 - 1.00 mg/dL   GFR calc non Af Amer 104 >59 mL/min/1.73   GFR calc Af Amer 120 >59 mL/min/1.73   BUN/Creatinine Ratio 9 9 - 23    Sodium 138 134 - 144 mmol/L   Potassium 4.1 3.5 - 5.2 mmol/L   Chloride 100 96 - 106 mmol/L   CO2  21 18 - 29 mmol/L   Calcium 8.8 8.7 - 10.2 mg/dL   Total Protein 6.3 6.0 - 8.5 g/dL   Albumin 4.2 3.5 - 5.5 g/dL   Globulin, Total 2.1 1.5 - 4.5 g/dL   Albumin/Globulin Ratio 2.0 1.2 - 2.2   Bilirubin Total <0.2 0.0 - 1.2 mg/dL   Alkaline Phosphatase 60 39 - 117 IU/L   AST 13 0 - 40 IU/L   ALT 9 0 - 32 IU/L  Ferritin   Collection Time: 05/21/16 10:15 AM  Result Value Ref Range   Ferritin 9 (L) 15 - 150 ng/mL  Results for orders placed or performed during the hospital encounter of 05/20/16 (from the past 8736 hour(s))  CBC with Differential   Collection Time: 05/20/16  1:41 PM  Result Value Ref Range   WBC 8.8 4.0 - 10.5 K/uL   RBC 4.47 3.87 - 5.11 MIL/uL   Hemoglobin 12.8 12.0 - 15.0 g/dL   HCT 39.1 36.0 - 46.0 %   MCV 87.5 78.0 - 100.0 fL   MCH 28.6 26.0 - 34.0 pg   MCHC 32.7 30.0 - 36.0 g/dL   RDW 14.6 11.5 - 15.5 %   Platelets 281 150 - 400 K/uL   Neutrophils Relative % 64 %   Neutro Abs 5.6 1.7 - 7.7 K/uL   Lymphocytes Relative 27 %   Lymphs Abs 2.4 0.7 - 4.0 K/uL   Monocytes Relative 7 %   Monocytes Absolute 0.7 0.1 - 1.0 K/uL   Eosinophils Relative 2 %   Eosinophils Absolute 0.2 0.0 - 0.7 K/uL   Basophils Relative 0 %   Basophils Absolute 0.0 0.0 - 0.1 K/uL  Comprehensive metabolic panel   Collection Time: 05/20/16  1:41 PM  Result Value Ref Range   Sodium 138 135 - 145 mmol/L   Potassium 3.2 (L) 3.5 - 5.1 mmol/L   Chloride 104 101 - 111 mmol/L   CO2 27 22 - 32 mmol/L   Glucose, Bld 85 65 - 99 mg/dL   BUN 5 (L) 6 - 20 mg/dL   Creatinine, Ser 0.57 0.44 - 1.00 mg/dL   Calcium 8.8 (L) 8.9 - 10.3 mg/dL   Total Protein 7.3 6.5 - 8.1 g/dL   Albumin 4.2 3.5 - 5.0 g/dL   AST 16 15 - 41 U/L   ALT 12 (L) 14 - 54 U/L   Alkaline Phosphatase 60 38 - 126 U/L   Total Bilirubin 0.3 0.3 - 1.2 mg/dL   GFR calc non Af Amer >60 >60 mL/min   GFR calc Af Amer >60 >60 mL/min    Anion gap 7 5 - 15  Troponin I   Collection Time: 05/20/16  1:41 PM  Result Value Ref Range   Troponin I <0.03 <0.03 ng/mL   Physical Findings: AIMS:  , ,  ,  ,    CIWA:    COWS:     Treatment Plan Summary: Medication management  Plan: I will continue Zoloft or depression and trazodone for sleep.  Recommend to call us back if she has any question concerns or if she feels worsening symptoms.  Followup in 4 months.  MEDICATIONS this encounter: Meds ordered this encounter  Medications  . traZODone (DESYREL) 50 MG tablet    Sig: Take 1 tablet (50 mg total) by mouth at bedtime. For sleep.    Dispense:  30 tablet    Refill:  3  . sertraline (ZOLOFT) 100 MG tablet    Sig: Take 1.5 tablets (  150 mg total) by mouth daily.    Dispense:  45 tablet    Refill:  3    Medical Decision Making Problem Points:  Established problem, stable/improving (1) and Review of last therapy session (1) Data Points:  Review or order clinical lab tests (1) Review of medication regiment & side effects (2)  Trevia Nop, [place] 03/14/2017, 4:55 PM

## 2017-04-29 ENCOUNTER — Other Ambulatory Visit (HOSPITAL_COMMUNITY): Payer: Self-pay | Admitting: Internal Medicine

## 2017-04-29 DIAGNOSIS — Z1231 Encounter for screening mammogram for malignant neoplasm of breast: Secondary | ICD-10-CM

## 2017-05-13 ENCOUNTER — Ambulatory Visit (HOSPITAL_COMMUNITY)
Admission: RE | Admit: 2017-05-13 | Discharge: 2017-05-13 | Disposition: A | Discharge status: Home / Self Care | Payer: BC Managed Care – PPO | Source: Ambulatory Visit | Attending: Internal Medicine | Admitting: Internal Medicine

## 2017-05-13 DIAGNOSIS — Z1231 Encounter for screening mammogram for malignant neoplasm of breast: Secondary | ICD-10-CM | POA: Diagnosis not present

## 2017-06-04 ENCOUNTER — Encounter: Payer: Self-pay | Admitting: Oncology

## 2017-06-04 ENCOUNTER — Ambulatory Visit (INDEPENDENT_AMBULATORY_CARE_PROVIDER_SITE_OTHER): Payer: BC Managed Care – PPO | Admitting: Oncology

## 2017-06-04 VITALS — BP 129/75 | HR 74 | Temp 98.2°F | Ht 67.0 in | Wt 147.5 lb

## 2017-06-04 DIAGNOSIS — M797 Fibromyalgia: Secondary | ICD-10-CM

## 2017-06-04 DIAGNOSIS — F0631 Mood disorder due to known physiological condition with depressive features: Secondary | ICD-10-CM | POA: Diagnosis not present

## 2017-06-04 DIAGNOSIS — D45 Polycythemia vera: Secondary | ICD-10-CM | POA: Diagnosis not present

## 2017-06-04 DIAGNOSIS — Z8719 Personal history of other diseases of the digestive system: Secondary | ICD-10-CM | POA: Diagnosis not present

## 2017-06-04 DIAGNOSIS — Z7982 Long term (current) use of aspirin: Secondary | ICD-10-CM | POA: Diagnosis not present

## 2017-06-04 DIAGNOSIS — Z888 Allergy status to other drugs, medicaments and biological substances status: Secondary | ICD-10-CM

## 2017-06-04 DIAGNOSIS — Z79899 Other long term (current) drug therapy: Secondary | ICD-10-CM | POA: Diagnosis not present

## 2017-06-04 DIAGNOSIS — Z880 Allergy status to penicillin: Secondary | ICD-10-CM

## 2017-06-04 NOTE — Progress Notes (Signed)
Hematology and Oncology Follow Up Visit  Cheryl Cross [id number] 1966/06/04 51 y.o. 06/04/2017 10:17 AM   Principle Diagnosis: Encounter Diagnosis  Name Primary?  . 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 Extensive evaluation in the past to exclude other causes of polycythemia was unremarkable. Please see my summary note dated 06/14/2013 for additional details. She had persistent and progressive elevation of her hemoglobin associated with constitutional symptoms and was therefore started on a phlebotomy program. She had prompt symptom relief. She currently has a phlebotomy  every 4 months.  An extensive cardiology evaluation done in July 2017 to evaluate palpitations was unrevealing. EKG and echocardiogram normal.  Interim History:   She is doing well at this time. She is in good spirits. She is had no interim medical problems. She just had a routine follow-up mammogram on July 2 and there were no suspicious findings. She had 12 teeth extracted on 05/23/2017 and was fitted with dentures. She remains single. She acquired a new dog. She continues to teach. No active depression at this time on current medication regimen.  Medications:   Current Outpatient Prescriptions:  .  aspirin 81 MG tablet, Take 1 tablet (81 mg total) by mouth daily. For Polycythemia Vera., Disp: , Rfl:  .  ketorolac (ACULAR) 0.5 % ophthalmic solution, Place 1 drop into both eyes daily as needed., Disp: , Rfl:  .  sertraline (ZOLOFT) 100 MG tablet, Take 1.5 tablets (150 mg total) by mouth daily., Disp: 45 tablet, Rfl: 3 .  traZODone (DESYREL) 50 MG tablet, Take 1 tablet (50 mg total) by mouth at bedtime. For sleep., Disp: 30 tablet, Rfl: 3  Allergies:  Allergies  Allergen Reactions  . Cyclobenzaprine Other (See Comments)    Hot sensation  . Diclofenac Sodium Itching  . Gabapentin Hives  . Penicillins Other (See Comments)    Unknown child  hood reaction  . Robaxin [Methocarbamol] Hives  . Voltaren [Diclofenac Sodium] Itching    Review of Systems: See interim history:  Remaining ROS negative:   Physical Exam: Blood pressure 129/75, pulse 74, temperature 98.2 F (36.8 C), temperature source Oral, height 5\' 7"  (1.702 m), weight 147 lb 8 oz (66.9 kg), SpO2 100 %. Wt Readings from Last 3 Encounters:  06/04/17 147 lb 8 oz (66.9 kg)  03/08/17 154 lb 4 oz (70 kg)  02/05/17 155 lb 8 oz (70.5 kg)     General appearance: Well-nourished Caucasian woman HENNT: Ecchymoses left face and periorbital status post recent dental extractions. Pharynx no erythema, exudate, mass, or ulcer. No thyromegaly or thyroid nodules Lymph nodes: No cervical, supraclavicular, or axillary lymphadenopathy Breasts:  Lungs: Clear to auscultation, resonant to percussion throughout Heart: Regular rhythm, no murmur, no gallop, no rub, no click, no edema Abdomen: Soft, nontender, normal bowel sounds, no mass, no organomegaly Extremities: No edema, no calf tenderness Musculoskeletal: no joint deformities GU:  Vascular: Carotid pulses 2+, no bruits,  Neurologic: Alert, oriented, PERRLA, optic discs sharp and vessels normal, no hemorrhage or exudate, cranial nerves grossly normal, motor strength 5 over 5, reflexes 1+ symmetric, upper body coordination normal, gait normal, Skin: No rash   Lab Results: CBC W/Diff    Component Value Date/Time   WBC 5.7 11/12/2016 1146   RBC 4.44 11/12/2016 1146   HGB 12.6 03/08/2017 1055   HGB 12.1 05/21/2016 1015   HGB 12.8 06/05/2013 1327   HCT 39.1 11/12/2016 1146   HCT 37.9 05/21/2016 1015   HCT  38.6 06/05/2013 1327   PLT 321 11/12/2016 1146   PLT 303 05/21/2016 1015   MCV 88.1 11/12/2016 1146   MCV 87 05/21/2016 1015   MCV 89.6 06/05/2013 1327   MCH 28.6 11/12/2016 1146   MCHC 32.5 11/12/2016 1146   RDW 15.6 (H) 11/12/2016 1146   RDW 15.8 (H) 05/21/2016 1015   RDW 14.4 06/05/2013 1327   LYMPHSABS 1.7  11/12/2016 1146   LYMPHSABS 1.7 05/21/2016 1015   LYMPHSABS 2.3 06/05/2013 1327   MONOABS 0.5 11/12/2016 1146   MONOABS 0.6 06/05/2013 1327   EOSABS 0.1 11/12/2016 1146   EOSABS 0.1 05/21/2016 1015   BASOSABS 0.0 11/12/2016 1146   BASOSABS 0.0 05/21/2016 1015   BASOSABS 0.1 06/05/2013 1327     Chemistry      Component Value Date/Time   NA 140 11/12/2016 1146   NA 138 05/21/2016 1015   NA 139 06/05/2013 1327   K 3.6 11/12/2016 1146   K 4.0 06/05/2013 1327   CL 108 11/12/2016 1146   CO2 27 11/12/2016 1146   CO2 26 06/05/2013 1327   BUN <5 (L) 11/12/2016 1146   BUN 6 05/21/2016 1015   BUN 4.2 (L) 06/05/2013 1327   CREATININE 0.65 11/12/2016 1146   CREATININE 0.57 05/31/2014 1000   CREATININE 0.8 06/05/2013 1327      Component Value Date/Time   CALCIUM 9.3 11/12/2016 1146   CALCIUM 9.2 06/05/2013 1327   ALKPHOS 60 11/12/2016 1146   ALKPHOS 63 06/05/2013 1327   AST 14 (L) 11/12/2016 1146   AST 7 06/05/2013 1327   ALT 16 11/12/2016 1146   ALT <6 Repeated and Verified 06/05/2013 1327   BILITOT 0.3 11/12/2016 1146   BILITOT <0.2 05/21/2016 1015   BILITOT 0.29 06/05/2013 1327       Radiological Studies: Mm Digital Screening Bilateral  Result Date: 05/13/2017 CLINICAL DATA:  Screening. EXAM: DIGITAL SCREENING BILATERAL MAMMOGRAM WITH CAD COMPARISON:  Previous exam(s). ACR Breast Density Category c: The breast tissue is heterogeneously dense, which may obscure small masses. FINDINGS: There are no findings suspicious for malignancy. Images were processed with CAD. IMPRESSION: No mammographic evidence of malignancy. A result letter of this screening mammogram will be mailed directly to the patient. RECOMMENDATION: Screening mammogram in one year. (Code:SM-B-01Y) BI-RADS CATEGORY  1: Negative. Electronically Signed   By: Lajean Manes M.D.   On: 05/13/2017 12:14    Impression:  #1. Polycythemia vera  Stable on intermittent phlebotomy every 4 months at the [facility] short stay  unit and low-dose aspirin Plan: continue the same.  #2. Fibromyalgia syndrome  #3. Reactive depression secondary to #2. Controlled on current medications. #4. History of Erosive gastritis resolved with treatment  #5. History of Palpitations without chest pain or syncope with negative cardiac evaluation  CC: Patient Care Team: Redmond School, MD as PCP - General (Internal Medicine)   Murriel Hopper, MD, [place]  Hematology-Oncology/Internal Medicine     7/24/201810:17 AM

## 2017-06-04 NOTE — Patient Instructions (Signed)
Continue every 4 month phlebotomy at [facility] short stay MD visit in 1 year Lab 1 week before visit

## 2017-06-21 ENCOUNTER — Encounter (HOSPITAL_COMMUNITY)
Admission: RE | Admit: 2017-06-21 | Discharge: 2017-06-21 | Disposition: A | Discharge status: Home / Self Care | Payer: BC Managed Care – PPO | Source: Ambulatory Visit | Attending: Oncology | Admitting: Oncology

## 2017-06-21 ENCOUNTER — Encounter (HOSPITAL_COMMUNITY): Payer: Self-pay

## 2017-06-21 DIAGNOSIS — D45 Polycythemia vera: Secondary | ICD-10-CM | POA: Insufficient documentation

## 2017-06-21 LAB — CBC WITH DIFFERENTIAL/PLATELET
BASOS PCT: 0 %
Basophils Absolute: 0 10*3/uL (ref 0.0–0.1)
EOS PCT: 2 %
Eosinophils Absolute: 0.1 10*3/uL (ref 0.0–0.7)
HEMATOCRIT: 37.8 % (ref 36.0–46.0)
Hemoglobin: 12.6 g/dL (ref 12.0–15.0)
Lymphocytes Relative: 28 %
Lymphs Abs: 1.8 10*3/uL (ref 0.7–4.0)
MCH: 29.1 pg (ref 26.0–34.0)
MCHC: 33.3 g/dL (ref 30.0–36.0)
MCV: 87.3 fL (ref 78.0–100.0)
MONOS PCT: 9 %
Monocytes Absolute: 0.6 10*3/uL (ref 0.1–1.0)
NEUTROS ABS: 3.9 10*3/uL (ref 1.7–7.7)
Neutrophils Relative %: 61 %
PLATELETS: 284 10*3/uL (ref 150–400)
RBC: 4.33 MIL/uL (ref 3.87–5.11)
RDW: 14.4 % (ref 11.5–15.5)
WBC: 6.5 10*3/uL (ref 4.0–10.5)

## 2017-06-21 LAB — FERRITIN: Ferritin: 6 ng/mL — ABNORMAL LOW (ref 11–307)

## 2017-06-21 NOTE — Progress Notes (Signed)
Cheryl Cross presents today for phlebotomy per MD orders. HGB/HCT: 12.6 Phlebotomy procedure started at 1105 and ended at 1120. 500 cc removed. Patient tolerated procedure well. IV needle removed intact. Next appointment for December 21 given to pt.  Vss post phlebotomy, no dizziness or lighheadedness.  Pt d/c ambulatory to lobby.

## 2017-07-14 ENCOUNTER — Other Ambulatory Visit (HOSPITAL_COMMUNITY): Payer: Self-pay | Admitting: Psychiatry

## 2017-07-14 DIAGNOSIS — F429 Obsessive-compulsive disorder, unspecified: Secondary | ICD-10-CM

## 2017-07-14 DIAGNOSIS — F418 Other specified anxiety disorders: Secondary | ICD-10-CM

## 2017-07-14 DIAGNOSIS — F5105 Insomnia due to other mental disorder: Principal | ICD-10-CM

## 2017-07-14 DIAGNOSIS — F321 Major depressive disorder, single episode, moderate: Secondary | ICD-10-CM

## 2017-07-16 ENCOUNTER — Encounter (HOSPITAL_COMMUNITY): Payer: Self-pay | Admitting: Psychiatry

## 2017-07-16 ENCOUNTER — Ambulatory Visit (INDEPENDENT_AMBULATORY_CARE_PROVIDER_SITE_OTHER): Payer: BC Managed Care – PPO | Admitting: Psychiatry

## 2017-07-16 VITALS — BP 135/83 | HR 77 | Ht 67.0 in | Wt 149.0 lb

## 2017-07-16 DIAGNOSIS — F429 Obsessive-compulsive disorder, unspecified: Secondary | ICD-10-CM

## 2017-07-16 DIAGNOSIS — G479 Sleep disorder, unspecified: Secondary | ICD-10-CM | POA: Diagnosis not present

## 2017-07-16 DIAGNOSIS — F321 Major depressive disorder, single episode, moderate: Secondary | ICD-10-CM

## 2017-07-16 DIAGNOSIS — F332 Major depressive disorder, recurrent severe without psychotic features: Secondary | ICD-10-CM

## 2017-07-16 DIAGNOSIS — F5105 Insomnia due to other mental disorder: Secondary | ICD-10-CM

## 2017-07-16 DIAGNOSIS — F1994 Other psychoactive substance use, unspecified with psychoactive substance-induced mood disorder: Secondary | ICD-10-CM

## 2017-07-16 DIAGNOSIS — Z79899 Other long term (current) drug therapy: Secondary | ICD-10-CM

## 2017-07-16 DIAGNOSIS — F418 Other specified anxiety disorders: Secondary | ICD-10-CM

## 2017-07-16 MED ORDER — SERTRALINE HCL 100 MG PO TABS: 150.0000 mg | ORAL_TABLET | Freq: Every day | ORAL | 3 refills | Status: DC

## 2017-07-16 MED ORDER — TRAZODONE HCL 50 MG PO TABS: 50.0000 mg | ORAL_TABLET | Freq: Every day | ORAL | 3 refills | Status: DC

## 2017-07-16 NOTE — Progress Notes (Signed)
Patient ID: Cheryl Cross, female   DOB: [birth year], 51 y.o.   MRN: [id number] Patient ID: Cheryl Cross, female   DOB: 1966-08-24, 51 y.o.   MRN: [id number] Patient ID: Cheryl Cross, female   DOB: July 14, 1966, 51 y.o.   MRN: [id number] Patient ID: Cheryl Cross, female   DOB: August 29, 1966, 51 y.o.   MRN: [id number] Patient ID: Cheryl Cross, female   DOB: 05-25-1966, 52 y.o.   MRN: [id number] Patient ID: Cheryl Cross, female   DOB: [birth year], 51 y.o.   MRN: [id number] Patient ID: Cheryl Cross, female   DOB: 19-Sep-1966, 51 y.o.   MRN: [id number] Patient ID: Cheryl Cross, female   DOB: 02-Nov-1966, 51 y.o.   MRN: [id number] Patient ID: Cheryl Cross, female   DOB: Dec 25, 1965, 51 y.o.   MRN: [id number] Patient ID: Cheryl Cross, female   DOB: Aug 31, 1966, 51 y.o.   MRN: [id number] [facility] MD Progress Note [id number] Cheryl Cross  MRN:  [id number] DOB:: December 09, 1965 Age: 51 y.o. Date: 07/16/2017  Chief Complaint: Chief Complaint  Patient presents with  . Depression  . Anxiety  . Follow-up   History of present illness This patient is a 51 year old separated white female who lives alone in [place]. 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 She had back pain and fibromyalgia. She got hooked on taking too many Xanax and was sleeping all the time and unable to function. [facility] and got off the Xanax and since then has been on trazodone and Zoloft. 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 She sees Dr. Jefm Miles here which she finds very helpful. She denies being depressed and stays on a good regimen of eating and sleeping. Her mood is generally been good and she's not significantly anxious. She's not abusing any substances and she denies suicidal ideation  The patient returns  after 4 months. She has been doing well. Her mood is good,and she is enjoying her job. The beginning of school is a little bit disorganized but it's starting to fall into place. She is sleeping well and thinks that her medications are going very well for her. She denies being anxious or depressed Diagnosis:   Axis I: Major Depression, Recurrent severe, Substance Abuse and Substance Induced Mood Disorder Axis II: Deferred Axis III:  Past Medical History:  Diagnosis Date  . Anxiety   . Arthritis   . Bursitis   . DDD (degenerative disc disease)   . Depression   . Fibromyalgia   . Obsessive-compulsive disorder   . Overactive bladder   . 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 PTSD (post-traumatic stress disorder)   . PVC's (premature ventricular contractions)    pt. placed on heart monitor x 24hours, everything fine   . Sesamoiditis February 2017   Axis IV: other psychosocial or environmental problems Axis V: 51-60 moderate symptoms  ADL's:  Intact  Sleep: Good, thanks to Trazodone  Appetite:  Fair  Suicidal Ideation:  Pt denies any thoughts, plans, intent of suicide\ Homicidal Ideation:  Pt denies any thoughts, plans, intent of homicide  AEB (as evidenced by):per pt report  Psychiatric Specialty Exam: ROS Patient ID: Cheryl Cross, female   DOB: [birth year], 51 y.o.   MRN: [id number] Patient ID: Cheryl Cross, female   DOB: 27-Feb-1966, 51 y.o.   MRN: [id number]  Patient ID: Cheryl Cross, female   DOB: [birth year], 51 y.o.   MRN: [id number] [facility] MD Progress Note [id number] Cheryl Cross  MRN:  [id number] DOB:: 1966/01/21 Age: 51 y.o. Date: 07/16/2017  Chief Complaint: Chief Complaint  Patient presents with  . Depression  . Anxiety  . Follow-up   t Diagnosis:   Axis I: Major Depression, Recurrent severe, Substance Abuse and Substance Induced Mood Disorder Axis II: Deferred Axis III:  Past Medical History:  Diagnosis Date  . Anxiety   . Arthritis   .  Bursitis   . DDD (degenerative disc disease)   . Depression   . Fibromyalgia   . Obsessive-compulsive disorder   . Overactive bladder   . 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 PTSD (post-traumatic stress disorder)   . PVC's (premature ventricular contractions)    pt. placed on heart monitor x 24hours, everything fine   . Sesamoiditis February 2017   Axis IV: other psychosocial or environmental problems Axis V: 51-60 moderate symptoms  ADL's:  Intact  Sleep: Good, thanks to Trazodone  Appetite:  Fair  Suicidal Ideation:  Pt denies any thoughts, plans, intent of suicide\ Homicidal Ideation:  Pt denies any thoughts, plans, intent of homicide  AEB (as evidenced by):per pt report  Psychiatric Specialty Exam: ROS  Blood pressure 135/83, pulse 77, height 5\' 7"  (1.702 m), weight 149 lb (67.6 kg).Body mass index is 23.34 kg/m.  General Appearance: Casual  Eye Contact::  Good  Speech:  Clear and Coherent  Volume:  Normal  Mood: good   Affect:  Congruent  Thought Process:  Coherent, Linear and Logical  Orientation:  Full (Time, Place, and Person)  Thought Content:  WDL  Suicidal Thoughts:  No  Homicidal Thoughts:  No  Memory:  Immediate;   Good Recent;   Good Remote;   Good  Judgement:  Good  Insight:  Good  Psychomotor Activity: Normal   Concentration:  Good  Recall:  Good  Akathisia:  No  Handed:  Right  AIMS (if indicated):     Assets:  Communication Skills Desire for Improvement  Sleep:      Current Medications: Current Outpatient Prescriptions  Medication Sig Dispense Refill  . aspirin 81 MG tablet Take 1 tablet (81 mg total) by mouth daily. For Polycythemia Vera.    Marland Kitchen ketorolac (ACULAR) 0.5 % ophthalmic solution Place 1 drop into both eyes daily as needed.    . sertraline (ZOLOFT) 100 MG tablet Take 1.5 tablets (150 mg total) by mouth daily. 45 tablet 3  . traZODone (DESYREL) 50 MG tablet Take 1 tablet (50 mg total) by mouth at bedtime. For sleep. 30  tablet 3   No current facility-administered medications for this visit.    Lab Results:  Results for orders placed or performed during the hospital encounter of 06/21/17 (from the past 8736 hour(s))  CBC with Differential/Platelet   Collection Time: 06/21/17 10:25 AM  Result Value Ref Range   WBC 6.5 4.0 - 10.5 K/uL   RBC 4.33 3.87 - 5.11 MIL/uL   Hemoglobin 12.6 12.0 - 15.0 g/dL   HCT 37.8 36.0 - 46.0 %   MCV 87.3 78.0 - 100.0 fL   MCH 29.1 26.0 - 34.0 pg   MCHC 33.3 30.0 - 36.0 g/dL   RDW 14.4 11.5 - 15.5 %   Platelets 284 150 - 400 K/uL   Neutrophils Relative % 61 %   Neutro Abs 3.9 1.7 - 7.7 K/uL   Lymphocytes Relative 28 %  Lymphs Abs 1.8 0.7 - 4.0 K/uL   Monocytes Relative 9 %   Monocytes Absolute 0.6 0.1 - 1.0 K/uL   Eosinophils Relative 2 %   Eosinophils Absolute 0.1 0.0 - 0.7 K/uL   Basophils Relative 0 %   Basophils Absolute 0.0 0.0 - 0.1 K/uL  Ferritin   Collection Time: 06/21/17 10:25 AM  Result Value Ref Range   Ferritin 6 (L) 11 - 307 ng/mL  Results for orders placed or performed during the hospital encounter of 03/08/17 (from the past 8736 hour(s))  Hemoglobin   Collection Time: 03/08/17 10:55 AM  Result Value Ref Range   Hemoglobin 12.6 12.0 - 15.0 g/dL  Results for orders placed or performed in visit on 02/05/17 (from the past 8736 hour(s))  Gastrointestinal Pathogen Panel PCR   Collection Time: 02/08/17  1:23 PM  Result Value Ref Range   Campylobacter, PCR Indeterm (A)    C. difficile Tox A/B, PCR Indeterm (A)    E coli 0157, PCR Indeterm (A)    E coli (ETEC) LT/ST PCR Indeterm (A)    E coli (STEC) stx1/stx2, PCR Indeterm (A)    Salmonella, PCR Indeterm (A)    Shigella, PCR Indeterm (A)    Norovirus, PCR Indeterm (A)    Rotavirus A, PCR Indeterm (A)    Giardia lamblia, PCR Indeterm (A)    Cryptosporidium, PCR Indeterm (A)   Results for orders placed or performed during the hospital encounter of 11/12/16 (from the past 8736 hour(s))  Urinalysis,  Routine w reflex microscopic   Collection Time: 11/12/16 11:03 AM  Result Value Ref Range   Color, Urine YELLOW YELLOW   APPearance CLEAR CLEAR   Specific Gravity, Urine <1.005 (L) 1.005 - 1.030   pH 5.5 5.0 - 8.0   Glucose, UA NEGATIVE NEGATIVE mg/dL   Hgb urine dipstick NEGATIVE NEGATIVE   Bilirubin Urine NEGATIVE NEGATIVE   Ketones, ur NEGATIVE NEGATIVE mg/dL   Protein, ur NEGATIVE NEGATIVE mg/dL   Nitrite NEGATIVE NEGATIVE   Leukocytes, UA NEGATIVE NEGATIVE  CBC with Differential   Collection Time: 11/12/16 11:46 AM  Result Value Ref Range   WBC 5.7 4.0 - 10.5 K/uL   RBC 4.44 3.87 - 5.11 MIL/uL   Hemoglobin 12.7 12.0 - 15.0 g/dL   HCT 39.1 36.0 - 46.0 %   MCV 88.1 78.0 - 100.0 fL   MCH 28.6 26.0 - 34.0 pg   MCHC 32.5 30.0 - 36.0 g/dL   RDW 15.6 (H) 11.5 - 15.5 %   Platelets 321 150 - 400 K/uL   Neutrophils Relative % 58 %   Neutro Abs 3.3 1.7 - 7.7 K/uL   Lymphocytes Relative 30 %   Lymphs Abs 1.7 0.7 - 4.0 K/uL   Monocytes Relative 9 %   Monocytes Absolute 0.5 0.1 - 1.0 K/uL   Eosinophils Relative 2 %   Eosinophils Absolute 0.1 0.0 - 0.7 K/uL   Basophils Relative 1 %   Basophils Absolute 0.0 0.0 - 0.1 K/uL  Lipase, blood   Collection Time: 11/12/16 11:46 AM  Result Value Ref Range   Lipase 36 11 - 51 U/L  Comprehensive metabolic panel   Collection Time: 11/12/16 11:46 AM  Result Value Ref Range   Sodium 140 135 - 145 mmol/L   Potassium 3.6 3.5 - 5.1 mmol/L   Chloride 108 101 - 111 mmol/L   CO2 27 22 - 32 mmol/L   Glucose, Bld 87 65 - 99 mg/dL   BUN <5 (L) 6 -  20 mg/dL   Creatinine, Ser 0.65 0.44 - 1.00 mg/dL   Calcium 9.3 8.9 - 10.3 mg/dL   Total Protein 7.2 6.5 - 8.1 g/dL   Albumin 4.2 3.5 - 5.0 g/dL   AST 14 (L) 15 - 41 U/L   ALT 16 14 - 54 U/L   Alkaline Phosphatase 60 38 - 126 U/L   Total Bilirubin 0.3 0.3 - 1.2 mg/dL   GFR calc non Af Amer >60 >60 mL/min   GFR calc Af Amer >60 >60 mL/min   Anion gap 5 5 - 15  Results for orders placed or performed  during the hospital encounter of 11/01/16 (from the past 8736 hour(s))  Hemoglobin   Collection Time: 11/01/16 11:11 AM  Result Value Ref Range   Hemoglobin 13.9 12.0 - 15.0 g/dL   Physical Findings: AIMS:  , ,  ,  ,    CIWA:    COWS:     Treatment Plan Summary: Medication management  Plan: I will continue Zoloft or depression and trazodone for sleep.  Recommend to call us back if she has any question concerns or if she feels worsening symptoms.  Followup in 4 months.  MEDICATIONS this encounter: Meds ordered this encounter  Medications  . DISCONTD: traMADol (ULTRAM) 50 MG tablet    Sig: take 1 tablet by mouth every 6 hours if needed for pain    Refill:  0  . DISCONTD: clindamycin (CLEOCIN) 300 MG capsule    Sig: take 1 capsules by mouth three times a day until finished    Refill:  0  . traZODone (DESYREL) 50 MG tablet    Sig: Take 1 tablet (50 mg total) by mouth at bedtime. For sleep.    Dispense:  30 tablet    Refill:  3  . sertraline (ZOLOFT) 100 MG tablet    Sig: Take 1.5 tablets (150 mg total) by mouth daily.    Dispense:  45 tablet    Refill:  3    Medical Decision Making Problem Points:  Established problem, stable/improving (1) and Review of last therapy session (1) Data Points:  Review or order clinical lab tests (1) Review of medication regiment & side effects (2)  Jaynell Castagnola, [place] 07/16/2017, 4:33 PM

## 2017-11-01 ENCOUNTER — Encounter (HOSPITAL_COMMUNITY): Payer: BC Managed Care – PPO

## 2017-11-07 ENCOUNTER — Other Ambulatory Visit (HOSPITAL_COMMUNITY): Payer: Self-pay | Admitting: Psychiatry

## 2017-11-07 ENCOUNTER — Telehealth (HOSPITAL_COMMUNITY): Payer: Self-pay | Admitting: *Deleted

## 2017-11-07 DIAGNOSIS — F5105 Insomnia due to other mental disorder: Principal | ICD-10-CM

## 2017-11-07 DIAGNOSIS — F418 Other specified anxiety disorders: Secondary | ICD-10-CM

## 2017-11-07 MED ORDER — TRAZODONE HCL 50 MG PO TABS: 50.0000 mg | ORAL_TABLET | Freq: Every day | ORAL | 0 refills | Status: DC

## 2017-11-07 NOTE — Telephone Encounter (Signed)
Dr Modesta Messing-- Dr Harrington Challenger Patient has a appointment on 11/13/17 & she is out of medication. She is requesting Trazodone (upcoming appointment on 11/13/17) . Refill request was sent on 11/04/17.

## 2017-11-07 NOTE — Telephone Encounter (Signed)
ordered

## 2017-11-13 ENCOUNTER — Ambulatory Visit (HOSPITAL_COMMUNITY): Payer: BC Managed Care – PPO | Admitting: Psychiatry

## 2017-11-13 ENCOUNTER — Encounter (HOSPITAL_COMMUNITY): Payer: Self-pay | Admitting: Psychiatry

## 2017-11-13 DIAGNOSIS — F418 Other specified anxiety disorders: Secondary | ICD-10-CM | POA: Diagnosis not present

## 2017-11-13 DIAGNOSIS — F5105 Insomnia due to other mental disorder: Secondary | ICD-10-CM | POA: Diagnosis not present

## 2017-11-13 DIAGNOSIS — F17210 Nicotine dependence, cigarettes, uncomplicated: Secondary | ICD-10-CM | POA: Diagnosis not present

## 2017-11-13 DIAGNOSIS — F321 Major depressive disorder, single episode, moderate: Secondary | ICD-10-CM

## 2017-11-13 DIAGNOSIS — M542 Cervicalgia: Secondary | ICD-10-CM

## 2017-11-13 DIAGNOSIS — F429 Obsessive-compulsive disorder, unspecified: Secondary | ICD-10-CM

## 2017-11-13 DIAGNOSIS — Z818 Family history of other mental and behavioral disorders: Secondary | ICD-10-CM

## 2017-11-13 MED ORDER — TRAZODONE HCL 50 MG PO TABS: 50.0000 mg | ORAL_TABLET | Freq: Every day | ORAL | 4 refills | Status: DC

## 2017-11-13 MED ORDER — SERTRALINE HCL 100 MG PO TABS: 150.0000 mg | ORAL_TABLET | Freq: Every day | ORAL | 3 refills | Status: DC

## 2017-11-13 NOTE — Progress Notes (Signed)
BH MD/PA/NP OP Progress Note  11/13/2017 4:37 PM Cheryl Cross  MRN:  [id number]  Chief Complaint:  Chief Complaint    Depression; Anxiety; Follow-up     HPI: This patient is a 52 year old separated white female who lives alone in [place]. 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 She had back pain and fibromyalgia. She got hooked on taking too many Xanax and was sleeping all the time and unable to function. She [facility] and got off the Xanax and since then has been on trazodone and Zoloft. 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 She sees Dr. Jefm Miles here which she finds very helpful. She denies being depressed and stays on a good regimen of eating and sleeping. Her mood is generally been good and she's not significantly anxious. She's not abusing any substances and she denies suicidal ideation  The patient returns after 4 months.  She is generally doing well.  She had a flareup in her neck and took prednisone muscle relaxers and Percocet for short time but now she is just doing physical therapy and it seeming to get better.  She has not had any relapses into medication abuse.  She is enjoying her job.  She denies any symptoms of current depression and she is sleeping well Visit Diagnosis:    ICD-10-CM   1. 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 Major depressive disorder, single episode, moderate (HCC) F32.1 sertraline (ZOLOFT) 100 MG tablet  3. Obsessive-compulsive disorder, unspecified type F42.9 sertraline (ZOLOFT) 100 MG tablet    Past Psychiatric History: One prior hospitalization for benzodiazepine abuse in 2013  Past Medical History:  Past Medical History:  Diagnosis Date  . Anxiety   . Arthritis   . Bursitis   . DDD (degenerative disc  disease)   . Depression   . Fibromyalgia   . Obsessive-compulsive disorder   . Overactive bladder   . 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 PTSD (post-traumatic stress disorder)   . PVC's (premature ventricular contractions)    pt. placed on heart monitor x 24hours, everything fine   . Sesamoiditis February 2017    Past Surgical History:  Procedure Laterality Date  . ABDOMINAL HYSTERECTOMY    . ESOPHAGOGASTRODUODENOSCOPY N/A 09/10/2014   Procedure: ESOPHAGOGASTRODUODENOSCOPY (EGD);  Surgeon: Rogene Houston, MD;  Location: [facility];  Service: Endoscopy;  Laterality: N/A;  130 - moved to 3:15 - Ann to notify  . LUMBAR FUSION    . TOOTH EXTRACTION Left Aug 2016    Family Psychiatric History: See below  Family History:  Family History  Problem Relation Age of Onset  . Cancer - Colon Mother        age 61  . Anxiety disorder Mother   . Atrial fibrillation Father   . Atrial fibrillation Brother   . Bipolar disorder Neg Hx   . Dementia Neg Hx   . Drug abuse Neg Hx   . Paranoid behavior Neg Hx   . Schizophrenia Neg Hx   . Seizures Neg Hx   . Sexual abuse Neg Hx   . Physical abuse Neg Hx     Social History:  Social History   Socioeconomic History  . Marital status: Legally Separated    Spouse name: None  . Number of children:  None  . Years of education: None  . Highest education level: None  Social Needs  . Financial resource strain: None  . Food insecurity - worry: None  . Food insecurity - inability: None  . Transportation needs - medical: None  . Transportation needs - non-medical: None  Occupational History  . None  Tobacco Use  . Smoking status: Current Every Day Smoker    Packs/day: 1.00    Years: 15.00    Pack years: 15.00    Types: Cigarettes    Start date: 11/12/1996  . Smokeless tobacco: Never Used  . Tobacco comment: 1 pack a day x 20 yrs.  Substance and Sexual Activity  . Alcohol use: No    Alcohol/week: 0.0 oz  . Drug use: No  . Sexual  activity: None  Other Topics Concern  . None  Social History Narrative  . None    Allergies:  Allergies  Allergen Reactions  . Cyclobenzaprine Other (See Comments)    Hot sensation  . Diclofenac Sodium Itching  . Gabapentin Hives  . Penicillins Other (See Comments)    Unknown child hood reaction  . Robaxin [Methocarbamol] Hives  . Voltaren [Diclofenac Sodium] Itching    Metabolic Disorder Labs: No results found for: HGBA1C, MPG No results found for: PROLACTIN No results found for: CHOL, TRIG, HDL, CHOLHDL, VLDL, LDLCALC Lab Results  Component Value Date   TSH 1.490 05/28/2016   TSH 0.896 03/11/2012    Therapeutic Level Labs: No results found for: LITHIUM No results found for: VALPROATE No components found for:  CBMZ  Current Medications: Current Outpatient Medications  Medication Sig Dispense Refill  . aspirin 81 MG tablet Take 1 tablet (81 mg total) by mouth daily. For Polycythemia Vera.    Marland Kitchen ketorolac (ACULAR) 0.5 % ophthalmic solution Place 1 drop into both eyes daily as needed.    . sertraline (ZOLOFT) 100 MG tablet Take 1.5 tablets (150 mg total) by mouth daily. 45 tablet 3  . traZODone (DESYREL) 50 MG tablet Take 1 tablet (50 mg total) by mouth at bedtime. For sleep. 30 tablet 4   No current facility-administered medications for this visit.      Musculoskeletal: Strength & Muscle Tone: within normal limits Gait & Station: normal Patient leans: N/A  Psychiatric Specialty Exam: Review of Systems  Musculoskeletal: Positive for neck pain.  All other systems reviewed and are negative.   Blood pressure 118/76, pulse 80, height 5\' 7"  (1.702 m), weight 143 lb (64.9 kg), SpO2 98 %.Body mass index is 22.4 kg/m.  General Appearance: Casual and Fairly Groomed  Eye Contact:  Good  Speech:  Clear and Coherent  Volume:  Normal  Mood:  Euthymic  Affect:  Congruent  Thought Process:  Goal Directed  Orientation:  Full (Time, Place, and Person)  Thought Content:  Rumination   Suicidal Thoughts:  No  Homicidal Thoughts:  No  Memory:  Immediate;   Good Recent;   Good Remote;   Good  Judgement:  Good  Insight:  Good  Psychomotor Activity:  Normal  Concentration:  Concentration: Good and Attention Span: Good  Recall:  Good  Fund of Knowledge: Good  Language: Good  Akathisia:  No  Handed:  Right  AIMS (if indicated): not done  Assets:  Communication Skills Desire for Improvement Physical Health Resilience Social Support Talents/Skills  ADL's:  Intact  Cognition: WNL  Sleep:  Good   Screenings: PHQ2-9     Office Visit from 06/04/2017 in [facility] Office Visit from 05/28/2016 in [place] Office Visit from 07/04/2015 in [facility] Office Visit from 06/07/2014 in [facility]  PHQ-2 Total Score  1  0  0  0       Assessment and Plan: This patient is a 52 year old female with a history of depression and past benzodiazepine abuse.  She has been very stable for the last 5 years.  Continue Zoloft 150 mg daily for depression and trazodone 50 mg daily at bedtime for sleep.  She will return to see me in 4 months   [name], MD 11/13/2017, 4:37 PM

## 2017-11-25 ENCOUNTER — Other Ambulatory Visit: Payer: Self-pay | Admitting: Oncology

## 2017-11-25 DIAGNOSIS — D45 Polycythemia vera: Secondary | ICD-10-CM

## 2017-11-27 ENCOUNTER — Encounter (HOSPITAL_COMMUNITY): Payer: Self-pay

## 2017-11-27 ENCOUNTER — Encounter (HOSPITAL_COMMUNITY)
Admission: RE | Admit: 2017-11-27 | Discharge: 2017-11-27 | Disposition: A | Discharge status: Home / Self Care | Payer: BC Managed Care – PPO | Source: Ambulatory Visit | Attending: Oncology | Admitting: Oncology

## 2017-11-27 DIAGNOSIS — D45 Polycythemia vera: Secondary | ICD-10-CM | POA: Insufficient documentation

## 2017-11-27 LAB — HEMOGLOBIN AND HEMATOCRIT, BLOOD
HCT: 41 % (ref 36.0–46.0)
HEMOGLOBIN: 13.9 g/dL (ref 12.0–15.0)

## 2017-11-27 MED ORDER — SODIUM CHLORIDE FLUSH 0.9 % IV SOLN
INTRAVENOUS | Status: AC
  Administered 2017-11-27: 10 mL
  Filled 2017-11-27: qty 20

## 2017-11-27 MED ORDER — SODIUM CHLORIDE FLUSH 0.9 % IV SOLN
INTRAVENOUS | Status: AC
  Administered 2017-11-27: 10 mL
  Filled 2017-11-27: qty 10

## 2017-11-27 NOTE — Discharge Instructions (Signed)

## 2017-11-27 NOTE — Progress Notes (Signed)
HGB- 13.9, HCT- 41.0 Theraputic Phlebotomy started at 1133, completed at 1159 per order, patient tolerated well, VSS, No problems noted.

## 2018-01-19 ENCOUNTER — Encounter (HOSPITAL_COMMUNITY): Payer: Self-pay | Admitting: Emergency Medicine

## 2018-01-19 ENCOUNTER — Other Ambulatory Visit: Payer: Self-pay

## 2018-01-19 ENCOUNTER — Emergency Department (HOSPITAL_COMMUNITY): Payer: BC Managed Care – PPO

## 2018-01-19 ENCOUNTER — Emergency Department (HOSPITAL_COMMUNITY)
Admission: EM | Admit: 2018-01-19 | Discharge: 2018-01-19 | Disposition: A | Discharge status: Home / Self Care | Payer: BC Managed Care – PPO | Attending: Emergency Medicine | Admitting: Emergency Medicine

## 2018-01-19 DIAGNOSIS — J40 Bronchitis, not specified as acute or chronic: Secondary | ICD-10-CM | POA: Insufficient documentation

## 2018-01-19 DIAGNOSIS — F17210 Nicotine dependence, cigarettes, uncomplicated: Secondary | ICD-10-CM | POA: Diagnosis not present

## 2018-01-19 DIAGNOSIS — J111 Influenza due to unidentified influenza virus with other respiratory manifestations: Secondary | ICD-10-CM | POA: Diagnosis not present

## 2018-01-19 DIAGNOSIS — R059 Cough, unspecified: Secondary | ICD-10-CM

## 2018-01-19 DIAGNOSIS — Z79899 Other long term (current) drug therapy: Secondary | ICD-10-CM | POA: Diagnosis not present

## 2018-01-19 DIAGNOSIS — R05 Cough: Secondary | ICD-10-CM

## 2018-01-19 DIAGNOSIS — R69 Illness, unspecified: Secondary | ICD-10-CM

## 2018-01-19 IMAGING — DX DG CHEST 2V
2 series · 2 of 2 positions shown · non-contrast
Comparison: Chest x-ray dated [DATE]

CLINICAL DATA: CIGARETTE ABUSE. PATIENT STATES " FEVER, NIGHT
SWEATS, CHEST CONGESTION X 4 DAYS, CURRENT SMOKER " HISTORY OF
FIBROMYALGIA, BURSITIS, ANXIETY, PVC'S

EXAM:
CHEST - 2 VIEW

[chest pa]
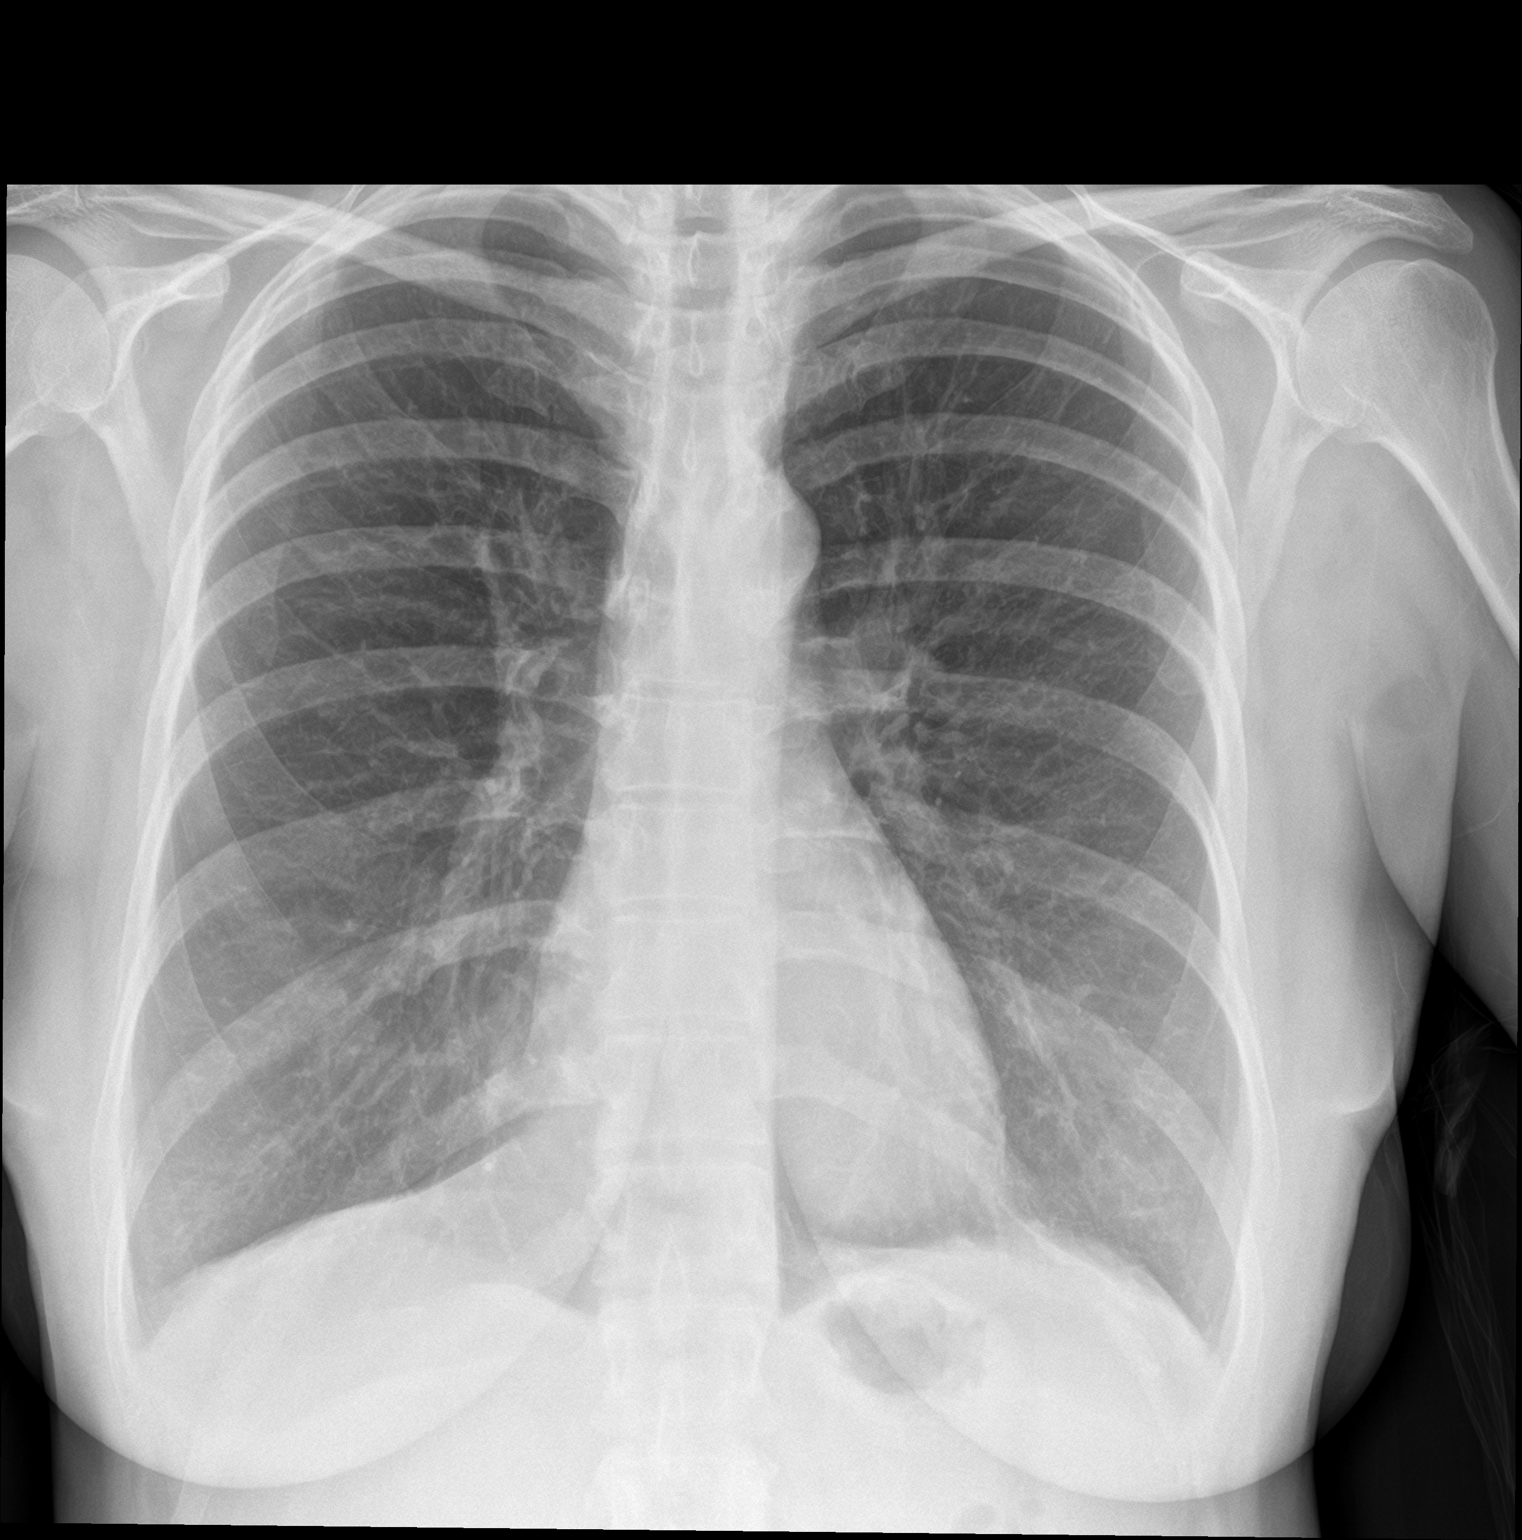

[chest lat]
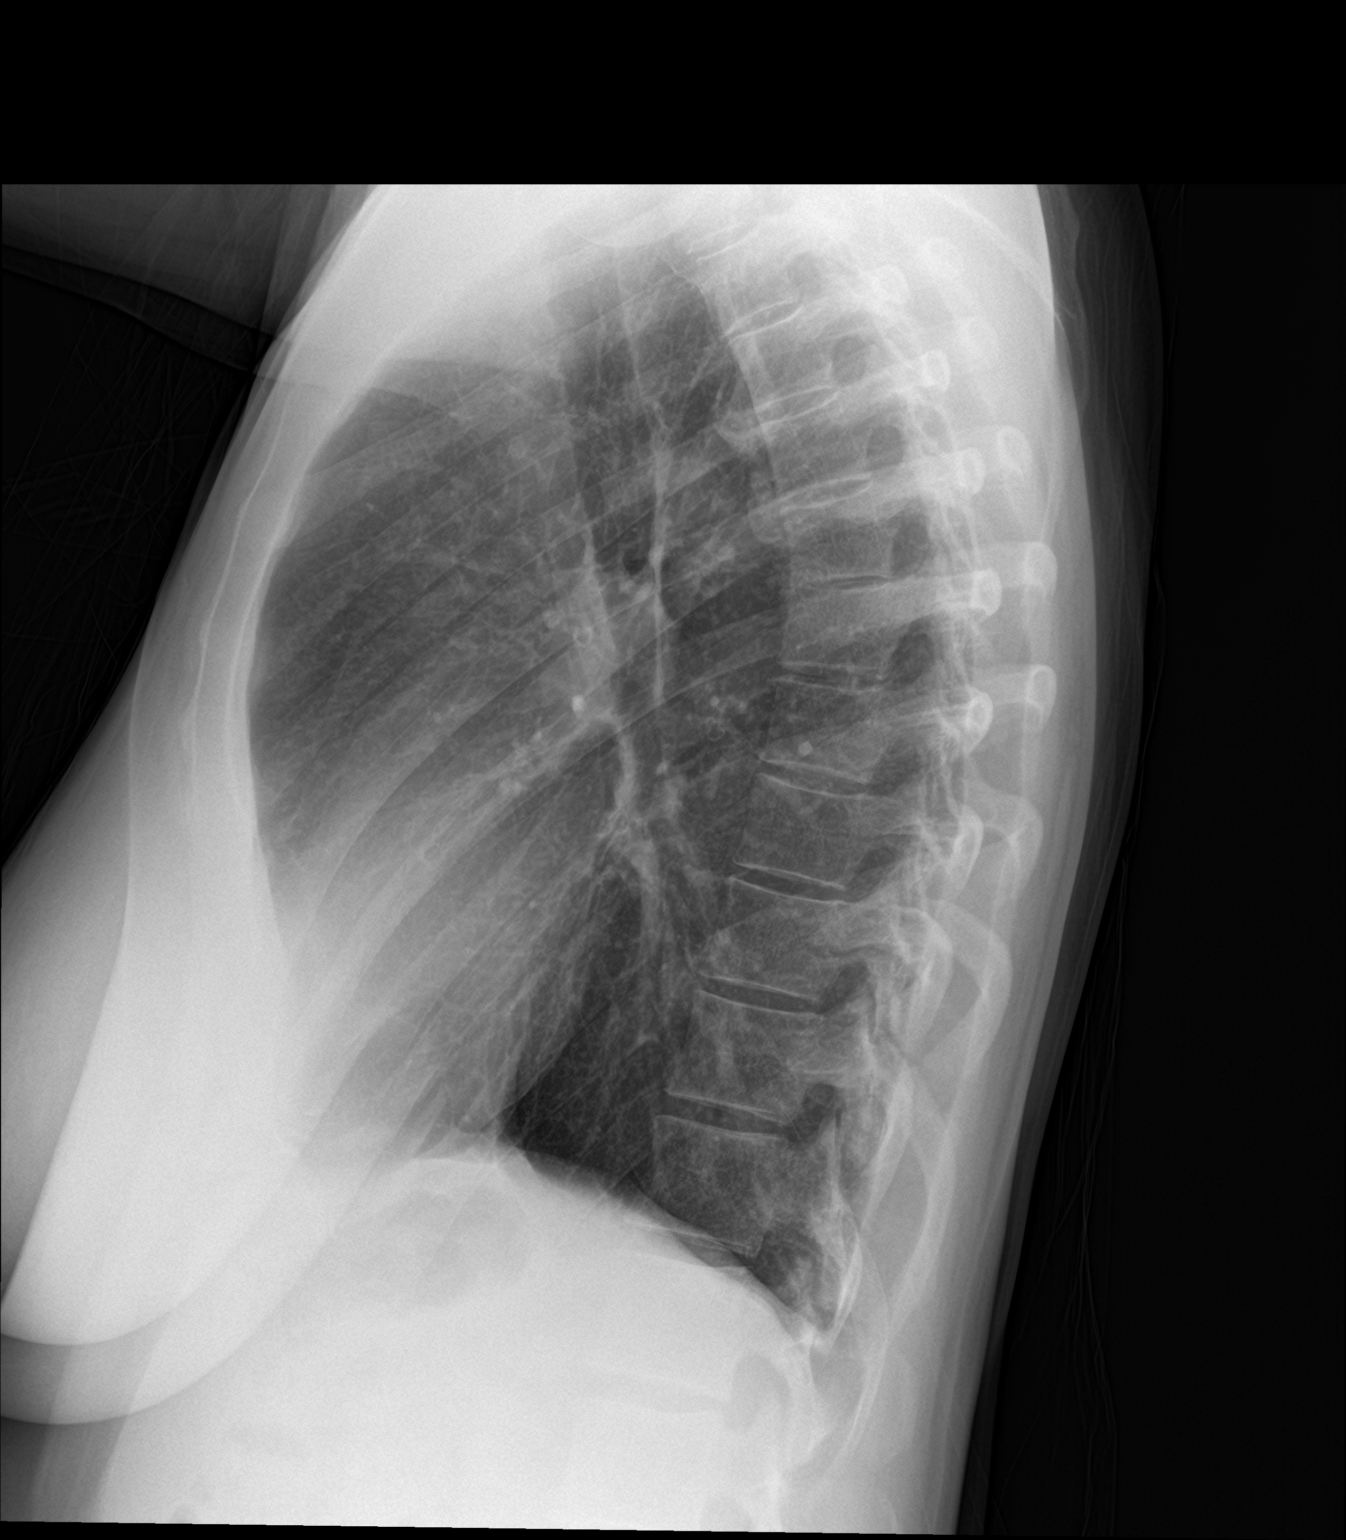

[2 of 2 positions shown; findings below may reference images not displayed]

FINDINGS: Heart size and mediastinal contours are normal. Lungs are clear. No
pleural effusion or pneumothorax seen. No acute or suspicious
osseous finding.
IMPRESSION: No active cardiopulmonary disease.

## 2018-01-19 MED ORDER — LORATADINE-PSEUDOEPHEDRINE ER 5-120 MG PO TB12: 1.0000 | ORAL_TABLET | Freq: Two times a day (BID) | ORAL | 0 refills | Status: DC

## 2018-01-19 MED ORDER — PROMETHAZINE-DM 6.25-15 MG/5ML PO SYRP: 5.0000 mL | ORAL_SOLUTION | Freq: Four times a day (QID) | ORAL | 0 refills | Status: DC | PRN

## 2018-01-19 MED ORDER — ALBUTEROL SULFATE HFA 108 (90 BASE) MCG/ACT IN AERS: 1.0000 | INHALATION_SPRAY | Freq: Four times a day (QID) | RESPIRATORY_TRACT | 0 refills | Status: DC | PRN

## 2018-01-19 MED ORDER — ALBUTEROL SULFATE (2.5 MG/3ML) 0.083% IN NEBU: 2.5000 mg | INHALATION_SOLUTION | RESPIRATORY_TRACT | 0 refills | Status: DC | PRN

## 2018-01-19 NOTE — Discharge Instructions (Signed)
It is important that you wash hands frequently.  Please use your mask until symptoms have resolved.  Please use Tylenol every 4 hours, and use ibuprofen every 6 hours for fever, and/or body aching.  Please use Claritin-D every 12 hours for congestion, 2 puffs of albuterol every 4 hours for assistance with breathing and cough.  Please use promethazine DM for cough.  This medication may cause drowsiness, please do not drive a vehicle, operate machinery, drink alcohol, or participate in activities requiring concentration when taking this medication.  Please get lots of rest.

## 2018-01-19 NOTE — ED Notes (Signed)
To Rad 

## 2018-01-19 NOTE — ED Notes (Signed)
HB in to assess 

## 2018-01-19 NOTE — ED Provider Notes (Signed)
[facility] EMERGENCY DEPARTMENT Provider Note   CSN: [id number] Arrival date & time: 01/19/18  1119     History   Chief Complaint Chief Complaint  Patient presents with  . Cough    HPI Cheryl Cross is a 52 y.o. female.  The history is provided by the patient.  URI   This is a new problem. The current episode started more than 2 days ago. The problem has been gradually worsening. The maximum temperature recorded prior to her arrival was 100 to 100.9 F. Associated symptoms include congestion, headaches, sneezing and cough. Pertinent negatives include no chest pain, no abdominal pain, no dysuria, no neck pain and no wheezing. Associated symptoms comments: Body aching. Treatments tried: otc cold medications. The treatment provided no relief.    Past Medical History:  Diagnosis Date  . Anxiety   . Arthritis   . Bursitis   . DDD (degenerative disc disease)   . Depression   . Fibromyalgia   . Obsessive-compulsive disorder   . Overactive bladder   . 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 PTSD (post-traumatic stress disorder)   . PVC's (premature ventricular contractions)    pt. placed on heart monitor x 24hours, everything fine   . Sesamoiditis February 2017    Patient Active Problem List   Diagnosis Date Noted  . OCD (obsessive compulsive disorder) 02/25/2013  . 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 Benzodiazepine dependence ([place]) 03/13/2012  . Major depressive disorder 03/13/2012  . Polycythemia vera ([place]) 12/07/2011  . ABDOMINAL PAIN, EPIGASTRIC 03/28/2010  . IBS 01/30/2010  . ABDOMINAL PAIN, LEFT LOWER QUADRANT 01/30/2010    Past Surgical History:  Procedure Laterality Date  . ABDOMINAL HYSTERECTOMY    . ESOPHAGOGASTRODUODENOSCOPY N/A 09/10/2014   Procedure: ESOPHAGOGASTRODUODENOSCOPY (EGD);  Surgeon: Rogene Houston, MD;  Location[facility];  Service: Endoscopy;  Laterality: N/A;  130 - moved to 3:15 - Ann to notify  . LUMBAR  FUSION    . TOOTH EXTRACTION Left Aug 2016    OB History    Gravida Para Term Preterm AB Living             0   SAB TAB Ectopic Multiple Live Births                   Home Medications    Prior to Admission medications   Medication Sig Start Date End Date Taking? Authorizing Provider  aspirin 81 MG tablet Take 1 tablet (81 mg total) by mouth daily. For Polycythemia Vera. 03/15/12   Greig Castilla, FNP  ketorolac (ACULAR) 0.5 % ophthalmic solution Place 1 drop into both eyes daily as needed. 02/09/15   [provider]  sertraline (ZOLOFT) 100 MG tablet Take 1.5 tablets (150 mg total) by mouth daily. 11/13/17   Cloria Spring, MD  traZODone (DESYREL) 50 MG tablet Take 1 tablet (50 mg total) by mouth at bedtime. For sleep. 11/13/17 12/13/17  Cloria Spring, MD    Family History Family History  Problem Relation Age of Onset  . Cancer - Colon Mother        age 26  . Anxiety disorder Mother   . Atrial fibrillation Father   . Atrial fibrillation Brother   . Bipolar disorder Neg Hx   . Dementia Neg Hx   . Drug abuse Neg Hx   . Paranoid behavior Neg Hx   . Schizophrenia Neg Hx   . Seizures Neg Hx   . Sexual  abuse Neg Hx   . Physical abuse Neg Hx     Social History Social History   Tobacco Use  . Smoking status: Current Every Day Smoker    Packs/day: 1.00    Years: 15.00    Pack years: 15.00    Types: Cigarettes    Start date: 11/12/1996  . Smokeless tobacco: Never Used  . Tobacco comment: 1 pack a day x 20 yrs.  Substance Use Topics  . Alcohol use: No    Alcohol/week: 0.0 oz  . Drug use: No     Allergies   Cyclobenzaprine; Diclofenac sodium; Gabapentin; Penicillins; Robaxin [methocarbamol]; and Voltaren [diclofenac sodium]   Review of Systems Review of Systems  Constitutional: Negative for activity change.       All ROS Neg except as noted in HPI  HENT: Positive for congestion and sneezing. Negative for nosebleeds.   Eyes: Negative for photophobia and  discharge.  Respiratory: Positive for cough. Negative for shortness of breath and wheezing.   Cardiovascular: Negative for chest pain and palpitations.  Gastrointestinal: Negative for abdominal pain and blood in stool.  Genitourinary: Negative for dysuria, frequency and hematuria.  Musculoskeletal: Negative for arthralgias, back pain and neck pain.  Skin: Negative.   Neurological: Positive for headaches. Negative for dizziness, seizures and speech difficulty.  Psychiatric/Behavioral: Negative for confusion and hallucinations.     Physical Exam Updated Vital Signs BP (!) 146/78 (BP Location: Right Arm)   Pulse 93   Temp 98.9 F (37.2 C) (Oral)   Resp 16   Ht 5\' 7"  (1.702 m)   Wt 72.6 kg (160 lb)   SpO2 94%   BMI 25.06 kg/m   Physical Exam  Constitutional: She is oriented to person, place, and time. She appears well-developed and well-nourished.  Non-toxic appearance.  HENT:  Head: Normocephalic.  Right Ear: Tympanic membrane and external ear normal.  Left Ear: Tympanic membrane and external ear normal.  Nasal congestion present.  There is mild increased redness of the uvula, the airway is patent.  Eyes: EOM and lids are normal. Pupils are equal, round, and reactive to light.  Neck: Normal range of motion. Neck supple. Carotid bruit is not present.  Cardiovascular: Normal rate, regular rhythm, normal heart sounds, intact distal pulses and normal pulses.  Pulmonary/Chest: Breath sounds normal. No respiratory distress.  Abdominal: Soft. Bowel sounds are normal. There is no tenderness. There is no guarding.  Musculoskeletal: Normal range of motion.  Lymphadenopathy:       Head (right side): No submandibular adenopathy present.       Head (left side): No submandibular adenopathy present.    She has no cervical adenopathy.  Neurological: She is alert and oriented to person, place, and time. She has normal strength. No cranial nerve deficit or sensory deficit.  Skin: Skin is warm  and dry.  Psychiatric: She has a normal mood and affect. Her speech is normal.  Nursing note and vitals reviewed.    ED Treatments / Results  Labs (all labs ordered are listed, but only abnormal results are displayed) Labs Reviewed - No data to display  EKG  EKG Interpretation None       Radiology Dg Chest 2 View  Result Date: 01/19/2018 CLINICAL DATA:  CIGARETTE ABUSE. PATIENT STATES " FEVER, NIGHT SWEATS, CHEST CONGESTION X 4 DAYS, CURRENT SMOKER " HISTORY OF FIBROMYALGIA, BURSITIS, ANXIETY, PVC'S EXAM: CHEST - 2 VIEW COMPARISON:  Chest x-ray dated 05/20/2016 FINDINGS: Heart size and mediastinal contours are normal. Lungs are  clear. No pleural effusion or pneumothorax seen. No acute or suspicious osseous finding. IMPRESSION: No active cardiopulmonary disease. Electronically Signed   By: Franki Cabot M.D.   On: 01/19/2018 11:58    Procedures Procedures (including critical care time)  Medications Ordered in ED Medications - No data to display   Initial Impression / Assessment and Plan / ED Course  I have reviewed the triage vital signs and the nursing notes.  Pertinent labs & imaging results that were available during my care of the patient were reviewed by me and considered in my medical decision making (see chart for details).     Final Clinical Impressions(s) / ED Diagnoses MDM  Vital signs within normal limits.  Pulse oximetry is 94% on room air.  Within normal limits.  The examination suggest upper respiratory/flulike illness.  The chest x-ray shows no active cardiopulmonary disease.  There is no evidence for acute or bacterial sinusitis.  There is no evidence for any meningeal changes, and no neurologic changes.  I have instructed the patient on the findings on the examination as well as the findings on the x-ray.  The patient will increase fluids, wash hands frequently.  A mask has been provided for the patient.  She is been given a note excusing her from work  duty over the next few days.  She will use Claritin-D for congestion, and promethazine DM for cough, and albuterol also for difficulty with breathing and cough.  The patient is advised to use Tylenol every 4 hours or ibuprofen every 6 hours for fever or aching.  She will follow-up with her primary physician or return to the emergency department if any changes in her condition, problems, or concerns.  The patient is in agreement with this plan.   Final diagnoses:  Influenza-like illness  Bronchitis    ED Discharge Orders        Ordered    loratadine-pseudoephedrine (CLARITIN-D 12 HOUR) 5-120 MG tablet  2 times daily     01/19/18 1311    promethazine-dextromethorphan (PROMETHAZINE-DM) 6.25-15 MG/5ML syrup  4 times daily PRN     01/19/18 1311    albuterol (PROVENTIL) (2.5 MG/3ML) 0.083% nebulizer solution  Every 4 hours PRN     01/19/18 1311    albuterol (PROVENTIL HFA;VENTOLIN HFA) 108 (90 Base) MCG/ACT inhaler  Every 6 hours PRN     01/19/18 1318       Lily Kocher, PA-C 01/19/18 [place], [place], DO 01/20/18 2217

## 2018-01-19 NOTE — ED Notes (Signed)
Awaiting evaluation  

## 2018-01-19 NOTE — ED Triage Notes (Signed)
Patient c/o flu like symptoms x2 days. Patient states cough, fever, body aches, nasal drainage, sore throat, and bilateral ear pain. Non productive cough. Patient taking robitussin, tylenol, ibuprofen, and Nyquil with no relief.

## 2018-01-19 NOTE — ED Notes (Signed)
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

## 2018-03-13 ENCOUNTER — Ambulatory Visit (HOSPITAL_COMMUNITY): Payer: BC Managed Care – PPO | Admitting: Psychiatry

## 2018-03-20 ENCOUNTER — Other Ambulatory Visit: Payer: Self-pay | Admitting: Oncology

## 2018-03-20 DIAGNOSIS — D45 Polycythemia vera: Secondary | ICD-10-CM

## 2018-03-28 ENCOUNTER — Encounter (HOSPITAL_COMMUNITY): Payer: Self-pay

## 2018-03-28 ENCOUNTER — Encounter (HOSPITAL_COMMUNITY)
Admission: RE | Admit: 2018-03-28 | Discharge: 2018-03-28 | Disposition: A | Discharge status: Home / Self Care | Payer: BC Managed Care – PPO | Source: Ambulatory Visit | Attending: Oncology | Admitting: Oncology

## 2018-03-28 DIAGNOSIS — D45 Polycythemia vera: Secondary | ICD-10-CM | POA: Insufficient documentation

## 2018-03-28 LAB — HEMOGLOBIN AND HEMATOCRIT, BLOOD
HEMATOCRIT: 41 % (ref 36.0–46.0)
HEMOGLOBIN: 13.6 g/dL (ref 12.0–15.0)

## 2018-03-28 NOTE — Discharge Instructions (Signed)
Therapeutic Phlebotomy Therapeutic phlebotomy is the controlled removal of blood from a person's body for the purpose of treating a medical condition. The procedure is similar to donating blood. Usually, about a pint (470 mL, or 0.47L) of blood is removed. The average adult has 9-12 pints (4.3-5.7 L) of blood. Therapeutic phlebotomy may be used to treat the following medical conditions:  Hemochromatosis. This is a condition in which the blood contains too much iron.  Polycythemia vera. This is a condition in which the blood contains too many red blood cells.  Porphyria cutanea tarda. This is a disease in which an important part of hemoglobin is not made properly. It results in the buildup of abnormal amounts of porphyrins in the body.  Sickle cell disease. This is a condition in which the red blood cells form an abnormal crescent shape rather than a round shape.  Tell a health care provider about:  Any allergies you have.  All medicines you are taking, including vitamins, herbs, eye drops, creams, and over-the-counter medicines.  Any problems you or family members have had with anesthetic medicines.  Any blood disorders you have.  Any surgeries you have had.  Any medical conditions you have. What are the risks? Generally, this is a safe procedure. However, problems may occur, including:  Nausea or light-headedness.  Low blood pressure.  Soreness, bleeding, swelling, or bruising at the needle insertion site.  Infection.  What happens before the procedure?  Follow instructions from your health care provider about eating or drinking restrictions.  Ask your health care provider about changing or stopping your regular medicines. This is especially important if you are taking diabetes medicines or blood thinners.  Wear clothing with sleeves that can be raised above the elbow.  Plan to have someone take you home after the procedure.  You may have a blood sample taken. What  happens during the procedure?  A needle will be inserted into one of your veins.  Tubing and a collection bag will be attached to that needle.  Blood will flow through the needle and tubing into the collection bag.  You may be asked to open and close your hand slowly and continually during the entire collection.  After the specified amount of blood has been removed from your body, the collection bag and tubing will be clamped.  The needle will be removed from your vein.  Pressure will be held on the site of the needle insertion to stop the bleeding.  A bandage (dressing) will be placed over the needle insertion site. The procedure may vary among health care providers and hospitals. What happens after the procedure?  Your recovery will be assessed and monitored.  You can return to your normal activities as directed by your health care provider. This information is not intended to replace advice given to you by your health care provider. Make sure you discuss any questions you have with your health care provider. Document Released: 04/02/2011 Document Revised: 06/30/2016 Document Reviewed: 10/25/2014 Elsevier Interactive Patient Education  2018 [employer]  

## 2018-03-28 NOTE — Progress Notes (Signed)
Cheryl Cross presents today for phlebotomy per MD orders. HGB/HCT 13.6 / 41 Phlebotomy procedure started at 0855 and ended at 0925. 500 cc removed. Patient tolerated procedure well. IV needle removed intact.

## 2018-03-31 ENCOUNTER — Ambulatory Visit (INDEPENDENT_AMBULATORY_CARE_PROVIDER_SITE_OTHER): Payer: BC Managed Care – PPO | Admitting: Psychiatry

## 2018-03-31 ENCOUNTER — Encounter (HOSPITAL_COMMUNITY): Payer: Self-pay | Admitting: Psychiatry

## 2018-03-31 VITALS — BP 117/79 | HR 85 | Ht 67.0 in | Wt 137.0 lb

## 2018-03-31 DIAGNOSIS — Z818 Family history of other mental and behavioral disorders: Secondary | ICD-10-CM

## 2018-03-31 DIAGNOSIS — F321 Major depressive disorder, single episode, moderate: Secondary | ICD-10-CM

## 2018-03-31 DIAGNOSIS — F429 Obsessive-compulsive disorder, unspecified: Secondary | ICD-10-CM | POA: Diagnosis not present

## 2018-03-31 DIAGNOSIS — F17210 Nicotine dependence, cigarettes, uncomplicated: Secondary | ICD-10-CM

## 2018-03-31 DIAGNOSIS — R450 Nervousness: Secondary | ICD-10-CM | POA: Diagnosis not present

## 2018-03-31 DIAGNOSIS — F5105 Insomnia due to other mental disorder: Secondary | ICD-10-CM | POA: Diagnosis not present

## 2018-03-31 DIAGNOSIS — F418 Other specified anxiety disorders: Secondary | ICD-10-CM | POA: Diagnosis not present

## 2018-03-31 DIAGNOSIS — F419 Anxiety disorder, unspecified: Secondary | ICD-10-CM | POA: Diagnosis not present

## 2018-03-31 MED ORDER — TRAZODONE HCL 50 MG PO TABS: 50.0000 mg | ORAL_TABLET | Freq: Every day | ORAL | 4 refills | Status: DC

## 2018-03-31 MED ORDER — SERTRALINE HCL 100 MG PO TABS: 150.0000 mg | ORAL_TABLET | Freq: Every day | ORAL | 3 refills | Status: DC

## 2018-03-31 NOTE — Progress Notes (Signed)
BH MD/PA/NP OP Progress Note  03/31/2018 4:50 PM Cheryl Cross  MRN:  [id number]  Chief Complaint:  Chief Complaint    Depression; Follow-up     HEN:IDPO patient is a 52 year old separated white female who lives alone in [place]. 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 She had back pain and fibromyalgia. She got hooked on taking too many Xanax and was sleeping all the time and unable to function. She [facility] and got off the Xanax and since then has been on trazodone and Zoloft. 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 She sees Dr. Jefm Miles here which she finds very helpful. She denies being depressed and stays on a good regimen of eating and sleeping. Her mood is generally been good and she's not significantly anxious. She's not abusing any substances and she denies suicidal ideation  The patient returns after 4 months.  She states that she is somewhat stressed but denies being actually depressed or having suicidal thoughts.  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  She does not like the ambiguity.  She has been more stressed lately not sleeping quite as well.  However she will have the summer off to recuperate get plenty of rest and exercise.  She is not used any substances. Visit Diagnosis:    ICD-10-CM   1. Major depressive disorder, single episode, moderate (HCC) F32.1 sertraline (ZOLOFT) 100 MG tablet  2. Obsessive-compulsive disorder, unspecified type F42.9 sertraline (ZOLOFT) 100 MG tablet  3. 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 Anxiety   . Arthritis   . Bursitis   . DDD (degenerative disc disease)   . Depression   . Fibromyalgia   . Obsessive-compulsive disorder   . Overactive bladder   . 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 PTSD (post-traumatic stress disorder)   . PVC's (premature ventricular contractions)    pt. placed on heart monitor x 24hours, everything fine   . Sesamoiditis February 2017    Past Surgical History:  Procedure Laterality Date  . ABDOMINAL HYSTERECTOMY    . ESOPHAGOGASTRODUODENOSCOPY N/A 09/10/2014   Procedure: ESOPHAGOGASTRODUODENOSCOPY (EGD);  Surgeon: Rogene Houston, MD;  Location: [facility];  Service: Endoscopy;  Laterality: N/A;  130 - moved to 3:15 - Ann to notify  . LUMBAR FUSION    . TOOTH EXTRACTION Left Aug 2016    Family Psychiatric History: See below  Family History:  Family History  Problem Relation Age of Onset  . Cancer - Colon Mother        age 94  . Anxiety disorder Mother   . Atrial fibrillation Father   . Atrial fibrillation Brother   . Bipolar disorder Neg Hx   . Dementia Neg Hx   . Drug abuse Neg Hx   . Paranoid behavior Neg Hx   . Schizophrenia Neg Hx   . Seizures Neg Hx   . Sexual abuse Neg Hx   . Physical abuse Neg Hx     Social History:  Social History  Socioeconomic History  . Marital status: Legally Separated    Spouse name: Not on file  . Number of children: Not on file  . Years of education: Not on file  . Highest education level: Not on file  Occupational History  . Not on file  Social Needs  . Financial resource strain: Not on file  . Food insecurity:    Worry: Not on file    Inability: Not on file  . Transportation needs:    Medical: Not on file    Non-medical: Not on file  Tobacco Use  . Smoking status: Current Every Day Smoker    Packs/day: 1.00    Years: 15.00    Pack years: 15.00    Types: Cigarettes    Start date: 11/12/1996  . Smokeless tobacco: Never Used  .  Tobacco comment: 1 pack a day x 20 yrs.  Substance and Sexual Activity  . Alcohol use: No    Alcohol/week: 0.0 oz  . Drug use: No    Types: Hydrocodone, Benzodiazepines  . Sexual activity: Not on file  Lifestyle  . Physical activity:    Days per week: Not on file    Minutes per session: Not on file  . Stress: Not on file  Relationships  . Social connections:    Talks on phone: Not on file    Gets together: Not on file    Attends religious service: Not on file    Active member of club or organization: Not on file    Attends meetings of clubs or organizations: Not on file    Relationship status: Not on file  Other Topics Concern  . Not on file  Social History Narrative  . Not on file    Allergies:  Allergies  Allergen Reactions  . Cyclobenzaprine Other (See Comments)    Hot sensation  . Diclofenac Sodium Itching  . Gabapentin Hives  . Penicillins Other (See Comments)    Unknown child hood reaction  . Robaxin [Methocarbamol] Hives  . Voltaren [Diclofenac Sodium] Itching    Metabolic Disorder Labs: No results found for: HGBA1C, MPG No results found for: PROLACTIN No results found for: CHOL, TRIG, HDL, CHOLHDL, VLDL, LDLCALC Lab Results  Component Value Date   TSH 1.490 05/28/2016   TSH 0.896 03/11/2012    Therapeutic Level Labs: No results found for: LITHIUM No results found for: VALPROATE No components found for:  CBMZ  Current Medications: Current Outpatient Medications  Medication Sig Dispense Refill  . albuterol (PROVENTIL HFA;VENTOLIN HFA) 108 (90 Base) MCG/ACT inhaler Inhale 1-2 puffs into the lungs every 6 (six) hours as needed for wheezing or shortness of breath. 1 Inhaler 0  . albuterol (PROVENTIL) (2.5 MG/3ML) 0.083% nebulizer solution Take 3 mLs (2.5 mg total) by nebulization every 4 (four) hours as needed for wheezing or shortness of breath. 75 mL 0  . aspirin 81 MG tablet Take 1 tablet (81 mg total) by mouth daily. For Polycythemia Vera.    Marland Kitchen  ketorolac (ACULAR) 0.5 % ophthalmic solution Place 1 drop into both eyes daily as needed.    . loratadine-pseudoephedrine (CLARITIN-D 12 HOUR) 5-120 MG tablet Take 1 tablet by mouth 2 (two) times daily. 20 tablet 0  . promethazine-dextromethorphan (PROMETHAZINE-DM) 6.25-15 MG/5ML syrup Take 5 mLs by mouth 4 (four) times daily as needed for cough. 118 mL 0  . sertraline (ZOLOFT) 100 MG tablet Take 1.5 tablets (150 mg total) by mouth daily. 45 tablet 3  . traZODone (DESYREL) 50 MG tablet  Take 1 tablet (50 mg total) by mouth at bedtime. For sleep. 30 tablet 4   No current facility-administered medications for this visit.      Musculoskeletal: Strength & Muscle Tone: within normal limits Gait & Station: normal Patient leans: N/A  Psychiatric Specialty Exam: Review of Systems  Constitutional: Positive for weight loss.  Psychiatric/Behavioral: The patient is nervous/anxious.   All other systems reviewed and are negative.   Blood pressure 117/79, pulse 85, height 5\' 7"  (1.702 m), weight 137 lb (62.1 kg), SpO2 95 %.Body mass index is 21.46 kg/m.  General Appearance: Casual, Neat and Well Groomed  Eye Contact:  Good  Speech:  Clear and Coherent  Volume:  Normal  Mood:  Anxious  Affect:  Congruent  Thought Process:  Goal Directed  Orientation:  Full (Time, Place, and Person)  Thought Content: Rumination   Suicidal Thoughts:  No  Homicidal Thoughts:  No  Memory:  Immediate;   Good Recent;   Good Remote;   Good  Judgement:  Good  Insight:  Good  Psychomotor Activity:  Normal  Concentration:  Concentration: Good and Attention Span: Good  Recall:  Good  Fund of Knowledge: Good  Language: Good  Akathisia:  No  Handed:  Right  AIMS (if indicated): not done  Assets:  Communication Skills Desire for Improvement Physical Health Resilience Social Support Talents/Skills Vocational/Educational  ADL's:  Intact  Cognition: WNL  Sleep:  Good   Screenings: PHQ2-9     Office Visit  from 06/04/2017 in [facility] Office Visit from 05/28/2016 in [facility] Office Visit from 07/04/2015 in [facility] Office Visit from 06/07/2014 in [facility]  PHQ-2 Total Score  1  0  0  0       Assessment and Plan: This patient is a 52 year old female with a history of depression and remote history of substance abuse.  For the most part she is doing well although she is a little bit stressed by her circumstances at school right now.  She does feel like her medications are still helpful.  We will continue Zoloft 150 mg daily for depression and trazodone 50 mg daily at bedtime for sleep.  She will return to see me in 3 months or call sooner if needed   [name], MD 03/31/2018, 4:50 PM

## 2018-04-15 ENCOUNTER — Other Ambulatory Visit (HOSPITAL_COMMUNITY): Payer: Self-pay | Admitting: Psychiatry

## 2018-04-15 DIAGNOSIS — F429 Obsessive-compulsive disorder, unspecified: Secondary | ICD-10-CM

## 2018-04-15 DIAGNOSIS — F321 Major depressive disorder, single episode, moderate: Secondary | ICD-10-CM

## 2018-04-21 ENCOUNTER — Other Ambulatory Visit (HOSPITAL_COMMUNITY): Payer: Self-pay | Admitting: Psychiatry

## 2018-04-21 DIAGNOSIS — F321 Major depressive disorder, single episode, moderate: Secondary | ICD-10-CM

## 2018-04-21 DIAGNOSIS — F429 Obsessive-compulsive disorder, unspecified: Secondary | ICD-10-CM

## 2018-04-24 ENCOUNTER — Other Ambulatory Visit (HOSPITAL_COMMUNITY): Payer: Self-pay | Admitting: Internal Medicine

## 2018-04-24 DIAGNOSIS — Z1231 Encounter for screening mammogram for malignant neoplasm of breast: Secondary | ICD-10-CM

## 2018-04-28 ENCOUNTER — Other Ambulatory Visit (HOSPITAL_COMMUNITY): Payer: Self-pay | Admitting: Psychiatry

## 2018-04-28 DIAGNOSIS — F5105 Insomnia due to other mental disorder: Principal | ICD-10-CM

## 2018-04-28 DIAGNOSIS — F418 Other specified anxiety disorders: Secondary | ICD-10-CM

## 2018-05-12 ENCOUNTER — Encounter: Payer: Self-pay | Admitting: Oncology

## 2018-05-14 ENCOUNTER — Ambulatory Visit (HOSPITAL_COMMUNITY)
Admission: RE | Admit: 2018-05-14 | Discharge: 2018-05-14 | Disposition: A | Discharge status: Home / Self Care | Payer: BC Managed Care – PPO | Source: Ambulatory Visit | Attending: Internal Medicine | Admitting: Internal Medicine

## 2018-05-14 DIAGNOSIS — Z1231 Encounter for screening mammogram for malignant neoplasm of breast: Secondary | ICD-10-CM | POA: Diagnosis present

## 2018-05-14 IMAGING — MG DIGITAL SCREENING BILATERAL MAMMOGRAM WITH CAD
4 series · 4 of 4 positions shown · non-contrast
Comparison: Previous exam(s).

CLINICAL DATA: Screening.

EXAM:
DIGITAL SCREENING BILATERAL MAMMOGRAM WITH CAD

[L MLO]
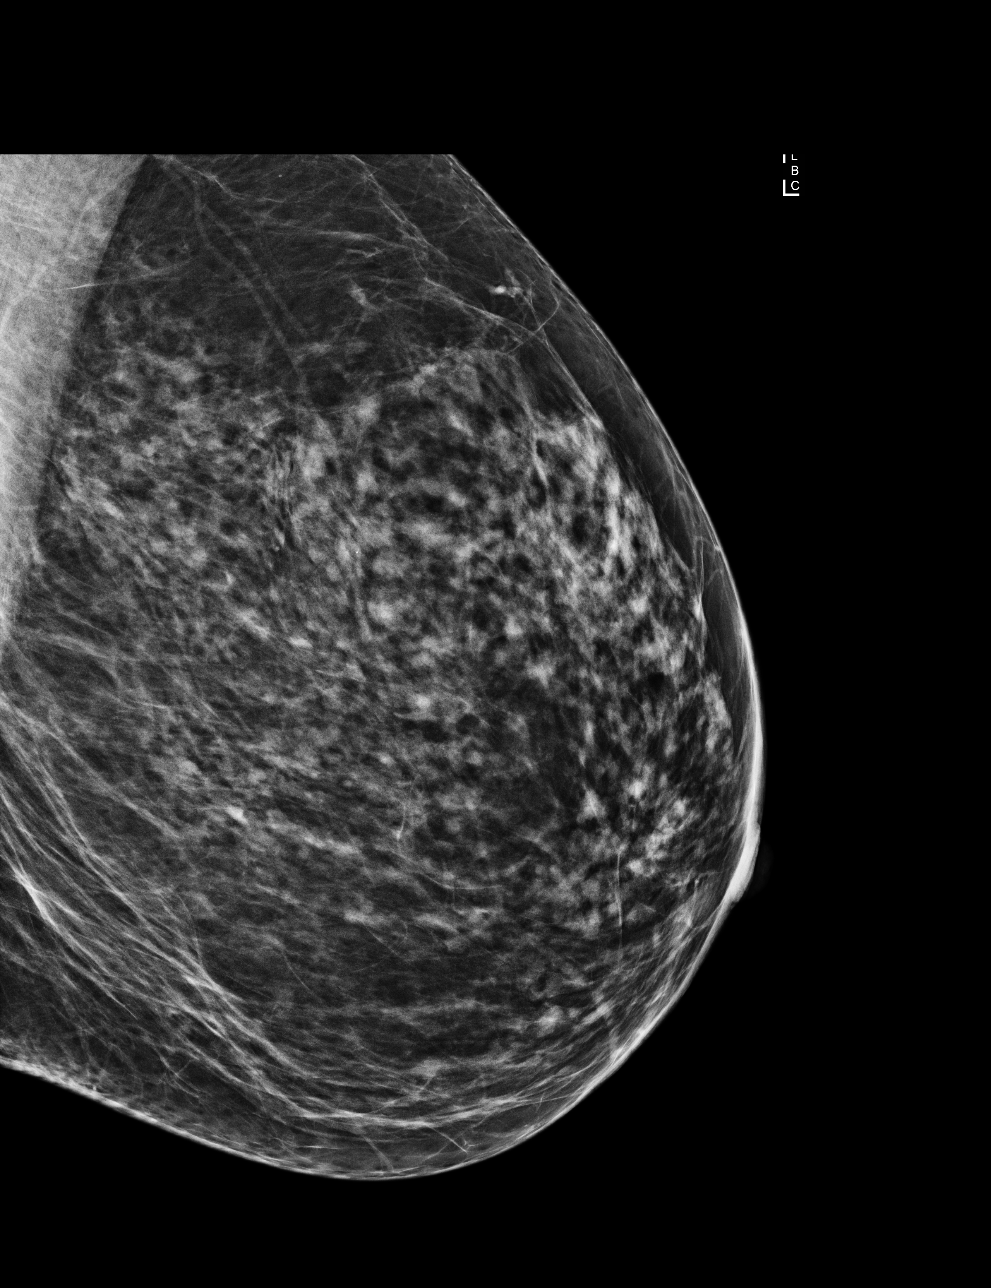

[R CC]
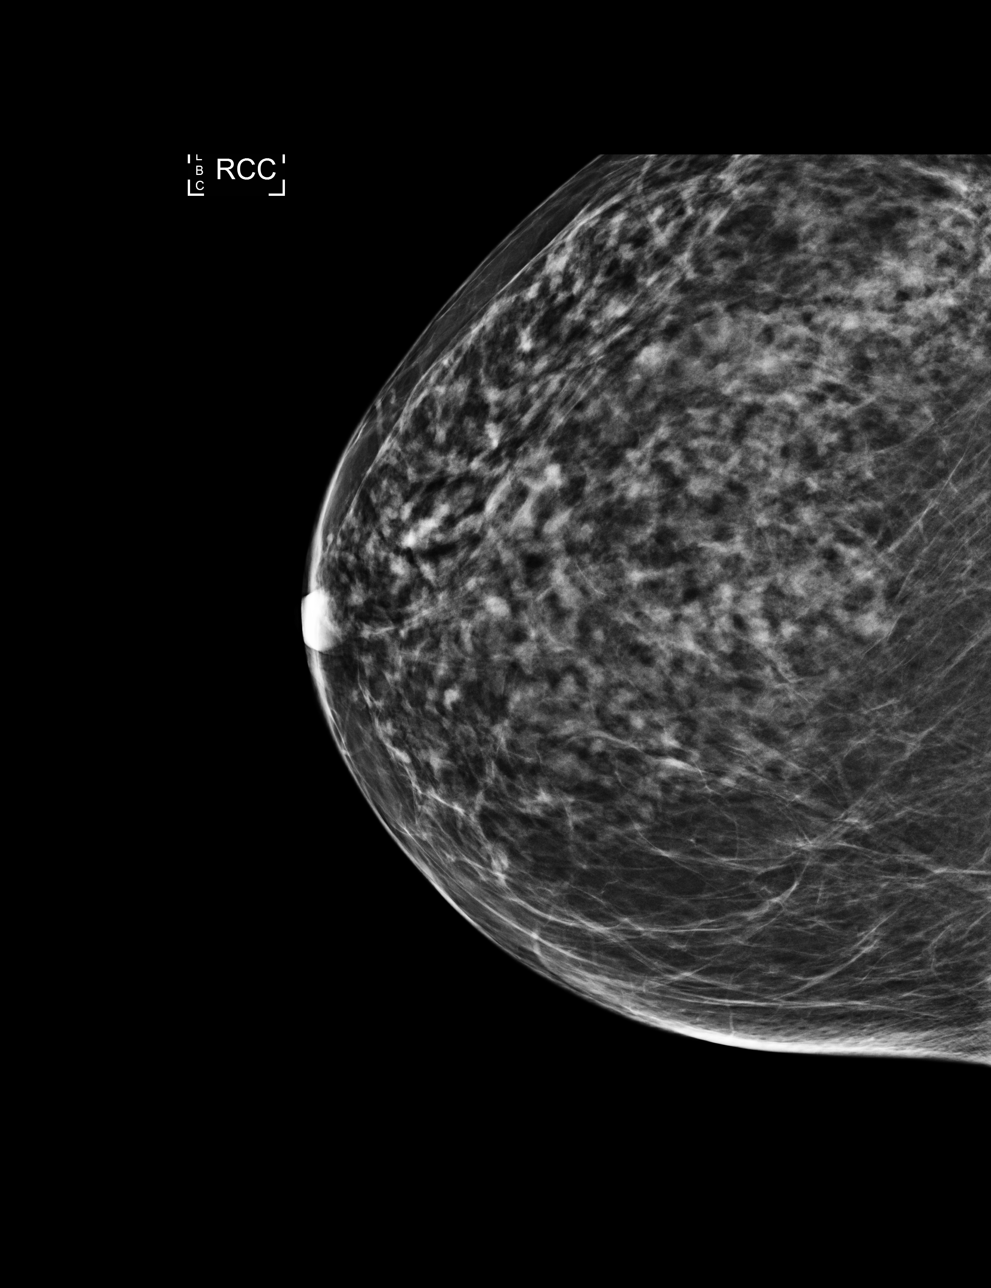

[L CC]
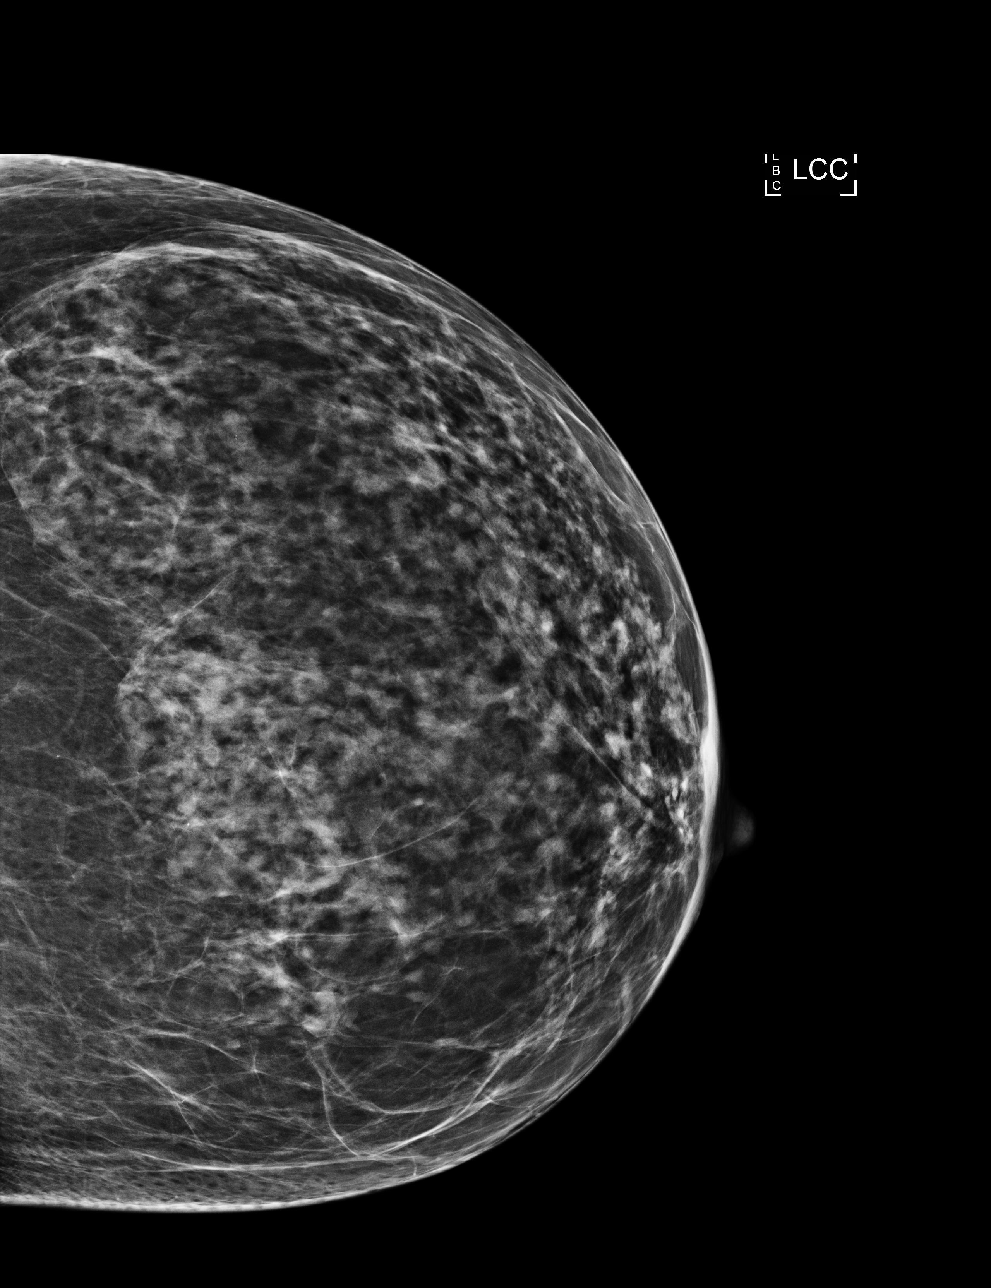

[R MLO]
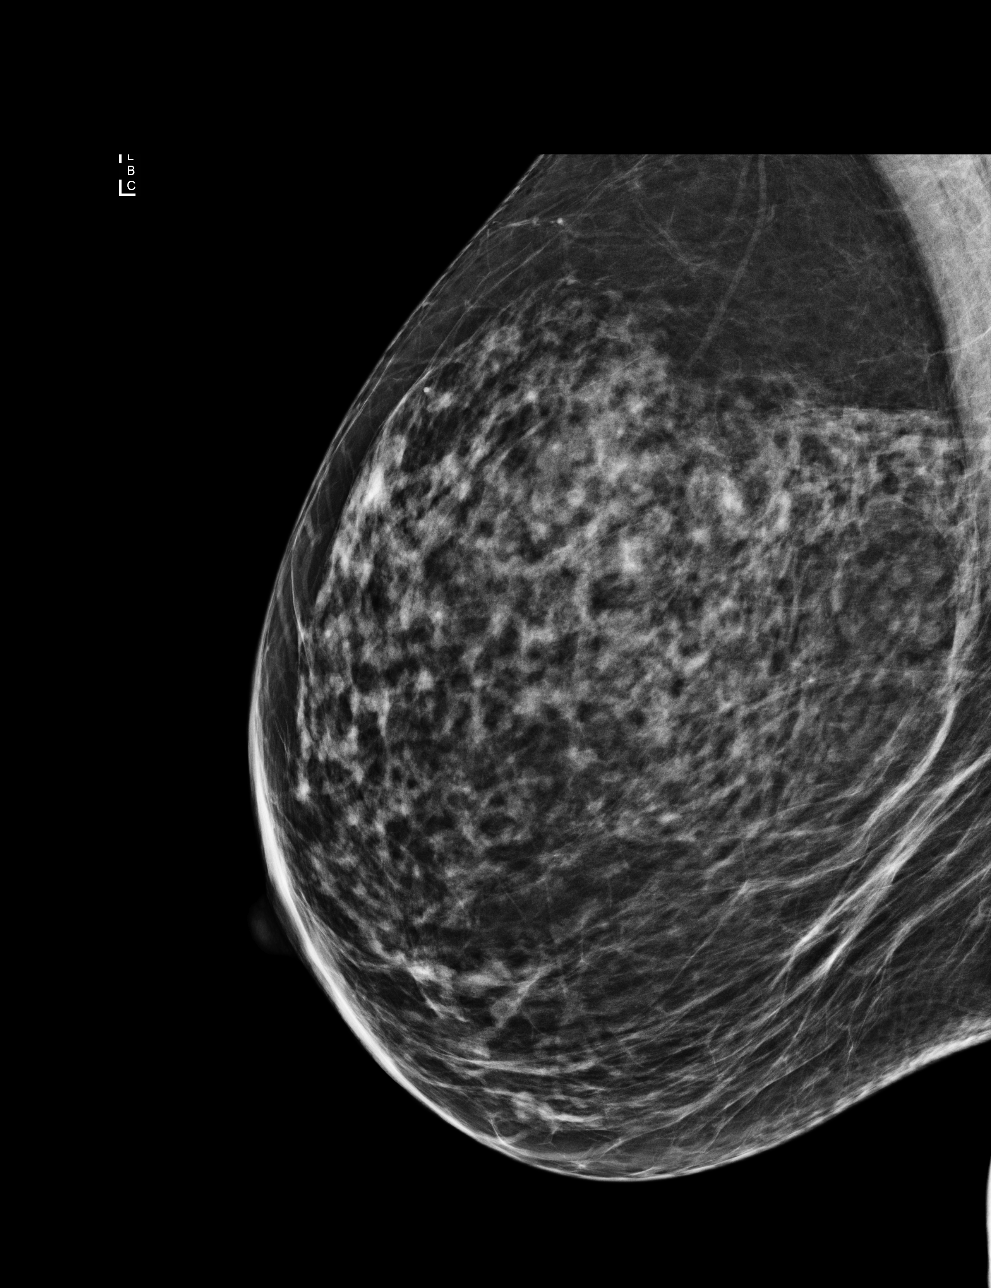

[4 of 4 positions shown; findings below may reference images not displayed]

ACR Breast Density Category c: The breast tissue is heterogeneously
dense, which may obscure small masses.
FINDINGS: There are no findings suspicious for malignancy. Images were
processed with CAD.
IMPRESSION: No mammographic evidence of malignancy. A result letter of this
screening mammogram will be mailed directly to the patient.

RECOMMENDATION:
Screening mammogram in one year. (Code:[0J])

BI-RADS CATEGORY  1: Negative.

## 2018-06-02 ENCOUNTER — Other Ambulatory Visit: Payer: Self-pay | Admitting: Oncology

## 2018-06-02 DIAGNOSIS — D45 Polycythemia vera: Secondary | ICD-10-CM

## 2018-06-04 ENCOUNTER — Other Ambulatory Visit (INDEPENDENT_AMBULATORY_CARE_PROVIDER_SITE_OTHER): Payer: BC Managed Care – PPO

## 2018-06-04 DIAGNOSIS — D45 Polycythemia vera: Secondary | ICD-10-CM

## 2018-06-05 LAB — CBC WITH DIFFERENTIAL/PLATELET
BASOS ABS: 0 10*3/uL (ref 0.0–0.2)
Basos: 0 %
EOS (ABSOLUTE): 0.2 10*3/uL (ref 0.0–0.4)
Eos: 3 %
Hematocrit: 40.2 % (ref 34.0–46.6)
Hemoglobin: 12.9 g/dL (ref 11.1–15.9)
IMMATURE GRANS (ABS): 0 10*3/uL (ref 0.0–0.1)
IMMATURE GRANULOCYTES: 0 %
LYMPHS: 35 %
Lymphocytes Absolute: 1.8 10*3/uL (ref 0.7–3.1)
MCH: 29.8 pg (ref 26.6–33.0)
MCHC: 32.1 g/dL (ref 31.5–35.7)
MCV: 93 fL (ref 79–97)
Monocytes Absolute: 0.4 10*3/uL (ref 0.1–0.9)
Monocytes: 8 %
NEUTROS PCT: 54 %
Neutrophils Absolute: 2.7 10*3/uL (ref 1.4–7.0)
Platelets: 317 10*3/uL (ref 150–450)
RBC: 4.33 x10E6/uL (ref 3.77–5.28)
RDW: 14.4 % (ref 12.3–15.4)
WBC: 5.2 10*3/uL (ref 3.4–10.8)

## 2018-06-05 LAB — COMPREHENSIVE METABOLIC PANEL
ALT: 9 IU/L (ref 0–32)
AST: 10 IU/L (ref 0–40)
Albumin/Globulin Ratio: 2 (ref 1.2–2.2)
Albumin: 4.1 g/dL (ref 3.5–5.5)
Alkaline Phosphatase: 69 IU/L (ref 39–117)
BUN/Creatinine Ratio: 4 — ABNORMAL LOW (ref 9–23)
BUN: 3 mg/dL — ABNORMAL LOW (ref 6–24)
CHLORIDE: 104 mmol/L (ref 96–106)
CO2: 25 mmol/L (ref 20–29)
Calcium: 9.1 mg/dL (ref 8.7–10.2)
Creatinine, Ser: 0.69 mg/dL (ref 0.57–1.00)
GFR calc Af Amer: 117 mL/min/{1.73_m2} (ref >59)
GFR, EST NON AFRICAN AMERICAN: 102 mL/min/{1.73_m2} (ref >59)
Globulin, Total: 2.1 g/dL (ref 1.5–4.5)
Glucose: 66 mg/dL (ref 65–99)
POTASSIUM: 3.5 mmol/L (ref 3.5–5.2)
Sodium: 143 mmol/L (ref 134–144)
Total Protein: 6.2 g/dL (ref 6.0–8.5)

## 2018-06-05 LAB — LACTATE DEHYDROGENASE: LDH: 118 IU/L — ABNORMAL LOW (ref 119–226)

## 2018-06-05 LAB — FERRITIN: Ferritin: 9 ng/mL — ABNORMAL LOW (ref 15–150)

## 2018-06-10 ENCOUNTER — Other Ambulatory Visit: Payer: Self-pay

## 2018-06-10 ENCOUNTER — Ambulatory Visit: Payer: BC Managed Care – PPO | Admitting: Oncology

## 2018-06-10 ENCOUNTER — Telehealth: Payer: Self-pay | Admitting: *Deleted

## 2018-06-10 ENCOUNTER — Encounter: Payer: Self-pay | Admitting: Oncology

## 2018-06-10 VITALS — BP 124/71 | HR 78 | Temp 98.4°F | Ht 67.0 in | Wt 137.8 lb

## 2018-06-10 DIAGNOSIS — D45 Polycythemia vera: Secondary | ICD-10-CM

## 2018-06-10 DIAGNOSIS — Z8679 Personal history of other diseases of the circulatory system: Secondary | ICD-10-CM

## 2018-06-10 DIAGNOSIS — F17200 Nicotine dependence, unspecified, uncomplicated: Secondary | ICD-10-CM

## 2018-06-10 DIAGNOSIS — F339 Major depressive disorder, recurrent, unspecified: Secondary | ICD-10-CM

## 2018-06-10 DIAGNOSIS — R1011 Right upper quadrant pain: Secondary | ICD-10-CM | POA: Diagnosis not present

## 2018-06-10 DIAGNOSIS — K589 Irritable bowel syndrome without diarrhea: Secondary | ICD-10-CM | POA: Diagnosis not present

## 2018-06-10 DIAGNOSIS — Z888 Allergy status to other drugs, medicaments and biological substances status: Secondary | ICD-10-CM

## 2018-06-10 DIAGNOSIS — Z8739 Personal history of other diseases of the musculoskeletal system and connective tissue: Secondary | ICD-10-CM

## 2018-06-10 DIAGNOSIS — Z79899 Other long term (current) drug therapy: Secondary | ICD-10-CM

## 2018-06-10 DIAGNOSIS — Z7982 Long term (current) use of aspirin: Secondary | ICD-10-CM

## 2018-06-10 DIAGNOSIS — Z8719 Personal history of other diseases of the digestive system: Secondary | ICD-10-CM

## 2018-06-10 DIAGNOSIS — Z880 Allergy status to penicillin: Secondary | ICD-10-CM

## 2018-06-10 HISTORY — DX: Right upper quadrant pain: R10.11

## 2018-06-10 NOTE — Progress Notes (Addendum)
Hematology and Oncology Follow Up Visit  Cheryl Cross [id number] 02-21-1966 52 y.o. 06/10/2018 1:49 PM   Principle Diagnosis: Encounter Diagnoses  Name Primary?  . Polycythemia vera ([place])   . 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 Extensive evaluation in the past to exclude other causes of polycythemia was unremarkable. Please see my summary note dated 06/14/2013 for additional details. She had persistent and progressive elevation of her hemoglobin associated with constitutional symptoms and was therefore started on a phlebotomy program in October, 2006. She had prompt symptom relief. She currently has a phlebotomy every 4 months.  An extensive cardiology evaluation done in July 2017 to evaluate palpitations was unrevealing. EKG and echocardiogram normal. She has a history of chronic depression currently well controlled on trazodone and Zoloft.    Interim History: She continues to do well.  She is trying to sell her house and will have to move in with her parents for few months.  Of interest her father now 46 years old just diagnosed with lung cancer and is getting what sounds like radioactive seed implant radiation therapy.  Her mother is out 5 years from diagnosis of colon cancer and doing well. She is still smoking and we discussed this again today.  She does not have a desire to stop. She has been having intermittent right upper quadrant abdominal pain worse over the last 4 days.  She has irritable bowel syndrome but the symptoms are new and different.  Intermittent nausea.  No vomiting.  No hematochezia or melena.   Medications: reviewed  Allergies:  Allergies  Allergen Reactions  . Cyclobenzaprine Other (See Comments)    Hot sensation  . Diclofenac Sodium Itching  . Gabapentin Hives  . Penicillins Other (See Comments)    Unknown child hood reaction  . Robaxin [Methocarbamol] Hives  .  Voltaren [Diclofenac Sodium] Itching    Review of Systems: See interim history Remaining ROS negative:   Physical Exam: Blood pressure 124/71, pulse 78, temperature 98.4 F (36.9 C), temperature source Oral, height 5\' 7"  (1.702 m), weight 137 lb 12.8 oz (62.5 kg), SpO2 99 %. Wt Readings from Last 3 Encounters:  06/10/18 137 lb 12.8 oz (62.5 kg)  01/19/18 160 lb (72.6 kg)  11/27/17 146 lb 6.4 oz (66.4 kg)     General appearance: Well-nourished Caucasian woman HENNT: Pharynx no erythema, exudate, mass, or ulcer. No thyromegaly or thyroid nodules Lymph nodes: No cervical, supraclavicular, or axillary lymphadenopathy Breasts: Lungs: Clear to auscultation, resonant to percussion throughout Heart: Regular rhythm, no murmur, no gallop, no rub, no click, no edema Abdomen: Soft, hyperactive bowel sounds, tender in the right upper quadrant and minimally in the epigastric region, no mass, no organomegaly Extremities: No edema, no calf tenderness Musculoskeletal: no joint deformities GU:  Vascular: Carotid pulses 2+, no bruits, distal pulses: Dorsalis pedis 1+ symmetric Neurologic: Alert, oriented, PERRLA, optic discs sharp and vessels normal on the left not well visualized on the right, no hemorrhage or exudate, cranial nerves grossly normal, motor strength 5 over 5, reflexes 1+ symmetric, upper body coordination normal, gait normal, Skin: No rash or ecchymosis  Lab Results: CBC W/Diff    Component Value Date/Time   WBC 5.2 06/04/2018 0920   WBC 6.5 06/21/2017 1025   RBC 4.33 06/04/2018 0920   RBC 4.33 06/21/2017 1025   HGB 12.9 06/04/2018 0920   HGB 12.8 06/05/2013 1327   HCT 40.2 06/04/2018 0920   HCT 38.6  06/05/2013 1327   PLT 317 06/04/2018 0920   MCV 93 06/04/2018 0920   MCV 89.6 06/05/2013 1327   MCH 29.8 06/04/2018 0920   MCH 29.1 06/21/2017 1025   MCHC 32.1 06/04/2018 0920   MCHC 33.3 06/21/2017 1025   RDW 14.4 06/04/2018 0920   RDW 14.4 06/05/2013 1327   LYMPHSABS 1.8  06/04/2018 0920   LYMPHSABS 2.3 06/05/2013 1327   MONOABS 0.6 06/21/2017 1025   MONOABS 0.6 06/05/2013 1327   EOSABS 0.2 06/04/2018 0920   BASOSABS 0.0 06/04/2018 0920   BASOSABS 0.1 06/05/2013 1327     Chemistry      Component Value Date/Time   NA 143 06/04/2018 0920   NA 139 06/05/2013 1327   K 3.5 06/04/2018 0920   K 4.0 06/05/2013 1327   CL 104 06/04/2018 0920   CO2 25 06/04/2018 0920   CO2 26 06/05/2013 1327   BUN 3 (L) 06/04/2018 0920   BUN 4.2 (L) 06/05/2013 1327   CREATININE 0.69 06/04/2018 0920   CREATININE 0.57 05/31/2014 1000   CREATININE 0.8 06/05/2013 1327      Component Value Date/Time   CALCIUM 9.1 06/04/2018 0920   CALCIUM 9.2 06/05/2013 1327   ALKPHOS 69 06/04/2018 0920   ALKPHOS 63 06/05/2013 1327   AST 10 06/04/2018 0920   AST 7 06/05/2013 1327   ALT 9 06/04/2018 0920   ALT <6 Repeated and Verified 06/05/2013 1327   BILITOT <0.2 06/04/2018 0920   BILITOT 0.29 06/05/2013 1327    Ferritin 9 consistent with chronic phlebotomy program  Radiological Studies: Mm Digital Screening Bilateral  Result Date: 05/14/2018 CLINICAL DATA:  Screening. EXAM: DIGITAL SCREENING BILATERAL MAMMOGRAM WITH CAD COMPARISON:  Previous exam(s). ACR Breast Density Category c: The breast tissue is heterogeneously dense, which may obscure small masses. FINDINGS: There are no findings suspicious for malignancy. Images were processed with CAD. IMPRESSION: No mammographic evidence of malignancy. A result letter of this screening mammogram will be mailed directly to the patient. RECOMMENDATION: Screening mammogram in one year. (Code:SM-B-01Y) BI-RADS CATEGORY  1: Negative. Electronically Signed   By: Franki Cabot M.D.   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 She would like to start getting [facility]botom[facility] at [facility] and I will transition her hematology care  there as I will be retiring next spring.  Keep current appointment already scheduled at [facility] long short stay in September.  2.  Unexplained right upper quadrant abdominal pain. Chemistry profile done on July 24 shows normal liver functions although she still has her gallbladder.  History of erosive gastritis but symptoms and exam more compatible with liver or gallbladder problems.  I am going to go ahead and schedule her for an abdominal ultrasound.  3.  Depression.  Question bipolar disorder.  Stable on current medication.  4.  History of fibromyalgia syndrome  5.  History of palpitations with negative cardiac evaluation for any malignant arrhythmias  CC: Patient Care Team: [employer], MD as PCP - General (Internal Medicine)   Murriel Hopper, MD, [place]  Hematology-Oncology/Internal Medicine     7/30/20191:49 PM

## 2018-06-10 NOTE — Patient Instructions (Signed)
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  Return visit with Dr Darnell Level as needed before 02/11/19

## 2018-06-10 NOTE — Telephone Encounter (Signed)
Noted - thanks DrG 

## 2018-06-10 NOTE — Telephone Encounter (Signed)
Called pt - no answer; left message about her [place] appt at [facility] on Friday 8/2 @ 0830 AM;arrive at 0800 AM and NPO x 6 hrs and call for any questions.

## 2018-06-13 ENCOUNTER — Ambulatory Visit (HOSPITAL_COMMUNITY)
Admission: RE | Admit: 2018-06-13 | Discharge: 2018-06-13 | Disposition: A | Discharge status: Home / Self Care | Payer: BC Managed Care – PPO | Source: Ambulatory Visit | Attending: Oncology | Admitting: Oncology

## 2018-06-13 DIAGNOSIS — R1011 Right upper quadrant pain: Secondary | ICD-10-CM | POA: Insufficient documentation

## 2018-06-16 ENCOUNTER — Telehealth: Payer: Self-pay | Admitting: *Deleted

## 2018-06-16 NOTE — Telephone Encounter (Signed)
Called pt - no answer; left message to call me back. 

## 2018-06-16 NOTE — Telephone Encounter (Signed)
-----   Message from Annia Belt, MD sent at 06/13/2018 12:56 PM EDT ----- Call pt: US abdomen normal. Liver & gallbladder normal. No gallstones.

## 2018-06-17 NOTE — Telephone Encounter (Signed)
Called pt again - no answer; pt does have My Chart to see results. Left  message "US abdomen normal. Liver & gallbladder normal. No gallstones." per Dr Beryle Beams. And call for any questions.

## 2018-06-24 NOTE — Telephone Encounter (Signed)
Called pt again (home and cell #'s); no answer.

## 2018-06-26 ENCOUNTER — Ambulatory Visit (HOSPITAL_COMMUNITY): Payer: BC Managed Care – PPO | Admitting: Psychiatry

## 2018-06-26 ENCOUNTER — Encounter (HOSPITAL_COMMUNITY): Payer: Self-pay | Admitting: Psychiatry

## 2018-06-26 DIAGNOSIS — F321 Major depressive disorder, single episode, moderate: Secondary | ICD-10-CM

## 2018-06-26 DIAGNOSIS — F5105 Insomnia due to other mental disorder: Secondary | ICD-10-CM | POA: Diagnosis not present

## 2018-06-26 DIAGNOSIS — F429 Obsessive-compulsive disorder, unspecified: Secondary | ICD-10-CM | POA: Diagnosis not present

## 2018-06-26 DIAGNOSIS — F418 Other specified anxiety disorders: Secondary | ICD-10-CM | POA: Diagnosis not present

## 2018-06-26 MED ORDER — SERTRALINE HCL 100 MG PO TABS: 150.0000 mg | ORAL_TABLET | Freq: Every day | ORAL | 3 refills | Status: DC

## 2018-06-26 MED ORDER — TRAZODONE HCL 50 MG PO TABS: 50.0000 mg | ORAL_TABLET | Freq: Every day | ORAL | 4 refills | Status: DC

## 2018-06-26 NOTE — Progress Notes (Signed)
BH MD/PA/NP OP Progress Note  06/26/2018 4:56 PM Cheryl Cross  MRN:  [id number]  Chief Complaint:  Chief Complaint    Depression; Anxiety; Follow-up     HPI: This patient is 52 year old separated white female who lives alone in [place]. 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 She had back pain and fibromyalgia. She got hooked on taking too many Xanax and was sleeping all the time and unable to function. She was [facility] and got off the Xanax and since then has been on trazodone and Zoloft. 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 She sees Dr. Jefm Miles here which she finds very helpful. She denies being depressed and stays on a good regimen of eating and sleeping. Her mood is generally been good and she's not significantly anxious. She's not abusing any substances and she denies suicidal ideation  The patient returns after 3 months.  She went to a big ordeal over the summer trying to get her household but finally sold and she is closing on it tomorrow.  Her house is almost gone into foreclosure which was very stressful.  She got through all this without using pills or drinking and she is very gratified.  She denies any current depression or suicidal ideation and her anxiety is under good control.  She is going to remain at [employer] high school. Visit Diagnosis:    ICD-10-CM   1. 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 Major depressive disorder, single episode, moderate (HCC) F32.1 sertraline (ZOLOFT) 100 MG tablet  3. Obsessive-compulsive disorder, unspecified type F42.9 sertraline (ZOLOFT) 100 MG tablet    Past Psychiatric History: Psychiatric hospitalization for benzodiazepine abuse in 2013  Past Medical History:  Past Medical History:   Diagnosis Date  . Abdominal pain, right upper quadrant 06/10/2018  . Anxiety   . Arthritis   . Bursitis   . DDD (degenerative disc disease)   . Depression   . Fibromyalgia   . Obsessive-compulsive disorder   . Overactive bladder   . 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 PTSD (post-traumatic stress disorder)   . PVC's (premature ventricular contractions)    pt. placed on heart monitor x 24hours, everything fine   . Sesamoiditis February 2017    Past Surgical History:  Procedure Laterality Date  . ABDOMINAL HYSTERECTOMY    . ESOPHAGOGASTRODUODENOSCOPY N/A 09/10/2014   Procedure: ESOPHAGOGASTRODUODENOSCOPY (EGD);  Surgeon: Rogene Houston, MD;  [facility];  Service: Endoscopy;  Laterality: N/A;  130 - moved to 3:15 - Ann to notify  . LUMBAR FUSION    . TOOTH EXTRACTION Left Aug 2016    Family Psychiatric History: See below  Family History:  Family History  Problem Relation Age of Onset  . Cancer - Colon Mother        age 64  . Anxiety disorder Mother   . Atrial fibrillation Father   . Atrial fibrillation Brother   . Bipolar disorder Neg Hx   . Dementia Neg Hx   . Drug abuse Neg Hx   . Paranoid behavior Neg Hx   . Schizophrenia Neg Hx   . Seizures Neg Hx   . Sexual abuse Neg Hx   . Physical abuse Neg Hx     Social History:  Social History   Socioeconomic History  .  Marital status: Legally Separated    Spouse name: Not on file  . Number of children: Not on file  . Years of education: Not on file  . Highest education level: Not on file  Occupational History  . Not on file  Social Needs  . Financial resource strain: Not on file  . Food insecurity:    Worry: Not on file    Inability: Not on file  . Transportation needs:    Medical: Not on file    Non-medical: Not on file  Tobacco Use  . Smoking status: Current Every Day Smoker    Packs/day: 1.00    Years: 15.00    Pack years: 15.00    Types: Cigarettes    Start date: 11/12/1996  .  Smokeless tobacco: Never Used  . Tobacco comment: 1 pack a day x 20 yrs.  Substance and Sexual Activity  . Alcohol use: No    Alcohol/week: 0.0 standard drinks  . Drug use: No    Types: Hydrocodone, Benzodiazepines  . Sexual activity: Not on file  Lifestyle  . Physical activity:    Days per week: Not on file    Minutes per session: Not on file  . Stress: Not on file  Relationships  . Social connections:    Talks on phone: Not on file    Gets together: Not on file    Attends religious service: Not on file    Active member of club or organization: Not on file    Attends meetings of clubs or organizations: Not on file    Relationship status: Not on file  Other Topics Concern  . Not on file  Social History Narrative  . Not on file    Allergies:  Allergies  Allergen Reactions  . Cyclobenzaprine Other (See Comments)    Hot sensation  . Diclofenac Sodium Itching  . Gabapentin Hives  . Penicillins Other (See Comments)    Unknown child hood reaction  . Robaxin [Methocarbamol] Hives  . Voltaren [Diclofenac Sodium] Itching    Metabolic Disorder Labs: No results found for: HGBA1C, MPG No results found for: PROLACTIN No results found for: CHOL, TRIG, HDL, CHOLHDL, VLDL, LDLCALC Lab Results  Component Value Date   TSH 1.490 05/28/2016   TSH 0.896 03/11/2012    Therapeutic Level Labs: No results found for: LITHIUM No results found for: VALPROATE No components found for:  CBMZ  Current Medications: Current Outpatient Medications  Medication Sig Dispense Refill  . aspirin 81 MG tablet Take 1 tablet (81 mg total) by mouth daily. For Polycythemia Vera.    Marland Kitchen ketorolac (ACULAR) 0.5 % ophthalmic solution Place 1 drop into both eyes daily as needed.    . loratadine-pseudoephedrine (CLARITIN-D 12 HOUR) 5-120 MG tablet Take 1 tablet by mouth 2 (two) times daily. 20 tablet 0  . sertraline (ZOLOFT) 100 MG tablet Take 1.5 tablets (150 mg total) by mouth daily. 45 tablet 3  .  traZODone (DESYREL) 50 MG tablet Take 1 tablet (50 mg total) by mouth at bedtime. For sleep. 30 tablet 4   No current facility-administered medications for this visit.      Musculoskeletal: Strength & Muscle Tone: within normal limits Gait & Station: normal Patient leans: N/A  Psychiatric Specialty Exam: Review of Systems  All other systems reviewed and are negative.   Blood pressure 119/83, pulse 90, height 5\' 7"  (1.702 m), weight 137 lb (62.1 kg), SpO2 94 %.Body mass index is 21.46 kg/m.  General Appearance: Casual, Neat and  Well Groomed  Eye Contact:  Good  Speech:  Clear and Coherent  Volume:  Normal  Mood:  Euthymic  Affect:  Congruent  Thought Process:  Goal Directed  Orientation:  Full (Time, Place, and Person)  Thought Content: WDL   Suicidal Thoughts:  No  Homicidal Thoughts:  No  Memory:  Immediate;   Good Recent;   Good Remote;   Good  Judgement:  Good  Insight:  Good  Psychomotor Activity:  Normal  Concentration:  Concentration: Good and Attention Span: Good  Recall:  Good  Fund of Knowledge: Good  Language: Good  Akathisia:  No  Handed:  Right  AIMS (if indicated): not done  Assets:  Communication Skills Desire for Improvement Physical Health Resilience Social Support Talents/Skills  ADL's:  Intact  Cognition: WNL  Sleep:  Good   Screenings: PHQ2-9     Office Visit from 06/10/2018 in [place] Office Visit from 06/04/2017 in [place] Office Visit from 05/28/2016 in [facility] Office Visit from 07/04/2015 in [place] from 06/07/2014 in [place]  PHQ-2 Total Score  1  1  0  0  0       Assessment and Plan: This patient is a 52 year old female with a history of depression and anxiety and a remote history of substance abuse.  She is doing well on her current regimen.  We will continue Zoloft 150 mg daily for  depression and trazodone 50 mg at bedtime for sleep.  She will return to see me in 4 months   [name], MD 06/26/2018, 4:56 PM

## 2018-07-30 ENCOUNTER — Encounter (HOSPITAL_COMMUNITY): Payer: BC Managed Care – PPO

## 2018-07-30 ENCOUNTER — Telehealth: Payer: Self-pay | Admitting: *Deleted

## 2018-07-30 NOTE — Telephone Encounter (Signed)
Pt had called Cheryl Cross - wanting to know if phlebotomies have been transferred to [facility]? She has a phlebotomy scheduled this Friday at [facility], which she will keep.

## 2018-07-31 ENCOUNTER — Other Ambulatory Visit: Payer: Self-pay | Admitting: Oncology

## 2018-07-31 DIAGNOSIS — D45 Polycythemia vera: Secondary | ICD-10-CM

## 2018-07-31 NOTE — Telephone Encounter (Signed)
Not yet - they should be giving her a call soon for an initial visit - then the MD who sees her will schedule the subsequent phlebotomies.

## 2018-08-01 ENCOUNTER — Encounter (HOSPITAL_COMMUNITY): Payer: Self-pay

## 2018-08-01 ENCOUNTER — Encounter (HOSPITAL_COMMUNITY)
Admission: RE | Admit: 2018-08-01 | Discharge: 2018-08-01 | Disposition: A | Discharge status: Home / Self Care | Payer: BC Managed Care – PPO | Source: Ambulatory Visit | Attending: Oncology | Admitting: Oncology

## 2018-08-01 DIAGNOSIS — D45 Polycythemia vera: Secondary | ICD-10-CM | POA: Insufficient documentation

## 2018-08-01 LAB — HEMOGLOBIN AND HEMATOCRIT, BLOOD
HCT: 45.4 % (ref 36.0–46.0)
Hemoglobin: 15 g/dL (ref 12.0–15.0)

## 2018-08-01 NOTE — Progress Notes (Signed)
Cheryl Cross presents today for phlebotomy per MD orders. HGB 15.0 Phlebotomy procedure started at 0950 and ended at 1000. 500 cc removed. Patient tolerated procedure well. IV needle removed intact.

## 2018-08-13 DIAGNOSIS — M5412 Radiculopathy, cervical region: Secondary | ICD-10-CM | POA: Insufficient documentation

## 2018-08-14 ENCOUNTER — Other Ambulatory Visit: Payer: Self-pay

## 2018-08-14 ENCOUNTER — Inpatient Hospital Stay (HOSPITAL_COMMUNITY): Payer: BC Managed Care – PPO | Attending: Hematology | Admitting: Hematology

## 2018-08-14 ENCOUNTER — Encounter (HOSPITAL_COMMUNITY): Payer: Self-pay | Admitting: Hematology

## 2018-08-14 VITALS — BP 121/71 | HR 77 | Temp 98.7°F | Resp 16 | Wt 135.0 lb

## 2018-08-14 DIAGNOSIS — N951 Menopausal and female climacteric states: Secondary | ICD-10-CM | POA: Insufficient documentation

## 2018-08-14 DIAGNOSIS — D45 Polycythemia vera: Secondary | ICD-10-CM | POA: Diagnosis not present

## 2018-08-14 DIAGNOSIS — Z800 Family history of malignant neoplasm of digestive organs: Secondary | ICD-10-CM

## 2018-08-14 DIAGNOSIS — Z90710 Acquired absence of both cervix and uterus: Secondary | ICD-10-CM

## 2018-08-14 DIAGNOSIS — Z720 Tobacco use: Secondary | ICD-10-CM | POA: Insufficient documentation

## 2018-08-14 DIAGNOSIS — Z801 Family history of malignant neoplasm of trachea, bronchus and lung: Secondary | ICD-10-CM

## 2018-08-14 DIAGNOSIS — Z806 Family history of leukemia: Secondary | ICD-10-CM

## 2018-08-14 NOTE — Progress Notes (Signed)
CONSULT NOTE  Patient Care Team: Redmond School, MD as PCP - General (Internal Medicine)  CHIEF COMPLAINTS/PURPOSE OF CONSULTATION:  To establish follow-up care for Jak 2- polycythemia.  HISTORY OF PRESENTING ILLNESS:  Cheryl Cross 52 y.o. 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 She was being seen by Dr. Beryle Beams for the last 13 years.  She presented in 2006 with the erythrocytosis and was tested negative for primary and secondary causes.  A bone marrow biopsy on 07/12/2005 revealed hypercellular marrow consistent with polycythemia vera.  She was started on intermittent phlebotomies as she was having symptoms including tiredness, night sweats and itching after taking hot showers.  She denies any erythromelalgia.  She never had any history of prior thrombosis.  No strokes were reported.  She smokes about 1 pack of cigarettes for the last 30 years.  Currently she is receiving phlebotomies once every 4 months.  After the phlebotomy, she feels improvement in her tiredness and night sweats.  Her last phlebotomy was on 08/01/2018.  She also has some hot flashes as she is perimenopausal.  She underwent a hysterectomy in 2006 for endometriosis.  Denies any recent infections or hospitalizations.  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  MEDICAL HISTORY:  Past Medical History:  Diagnosis Date  . Abdominal pain, right upper quadrant 06/10/2018  . Anxiety   . Arthritis   . Bursitis   . DDD (degenerative disc disease)   . Depression   . Fibromyalgia   . Obsessive-compulsive disorder   . Overactive bladder   . 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 PTSD (post-traumatic stress disorder)   . PVC's (premature ventricular contractions)    pt. placed on heart monitor x 24hours, everything fine   . Sesamoiditis February 2017    SURGICAL HISTORY: Past Surgical History:  Procedure Laterality Date  . ABDOMINAL  HYSTERECTOMY    . ESOPHAGOGASTRODUODENOSCOPY N/A 09/10/2014   Procedure: ESOPHAGOGASTRODUODENOSCOPY (EGD);  Surgeon: Rogene Houston, MD;  [facility];  Service: Endoscopy;  Laterality: N/A;  130 - moved to 3:15 - Ann to notify  . LUMBAR FUSION    . TOOTH EXTRACTION Left Aug 2016    SOCIAL HISTORY: Social History   Socioeconomic History  . Marital status: Legally Separated    Spouse name: Not on file  . Number of children: Not on file  . Years of education: Not on file  . Highest education level: Not on file  Occupational History  . Not on file  Social Needs  . Financial resource strain: Not on file  . Food insecurity:    Worry: Not on file    Inability: Not on file  . Transportation needs:    Medical: Not on file    Non-medical: Not on file  Tobacco Use  . Smoking status: Current Every Day Smoker    Packs/day: 1.00    Years: 15.00    Pack years: 15.00    Types: Cigarettes    Start date: 11/12/1996  . Smokeless tobacco: Never Used  . Tobacco comment: 1 pack a day x 20 yrs.  Substance and Sexual Activity  . Alcohol use: No    Alcohol/week: 0.0 standard drinks  . Drug use: No    Types: Hydrocodone, Benzodiazepines  . Sexual activity: Not on file  Lifestyle  . Physical activity:    Days per week: Not on file    Minutes per session: Not  on file  . Stress: Not on file  Relationships  . Social connections:    Talks on phone: Not on file    Gets together: Not on file    Attends religious service: Not on file    Active member of club or organization: Not on file    Attends meetings of clubs or organizations: Not on file    Relationship status: Not on file  . Intimate partner violence:    Fear of current or ex partner: Not on file    Emotionally abused: Not on file    Physically abused: Not on file    Forced sexual activity: Not on file  Other Topics Concern  . Not on file  Social History Narrative  . Not on file    FAMILY HISTORY: Family History   Problem Relation Age of Onset  . Cancer - Colon Mother        age 30  . Anxiety disorder Mother   . Atrial fibrillation Father   . Atrial fibrillation Brother   . Bipolar disorder Neg Hx   . Dementia Neg Hx   . Drug abuse Neg Hx   . Paranoid behavior Neg Hx   . Schizophrenia Neg Hx   . Seizures Neg Hx   . Sexual abuse Neg Hx   . Physical abuse Neg Hx     ALLERGIES:  is allergic to cyclobenzaprine; diclofenac sodium; gabapentin; penicillins; and voltaren [diclofenac sodium].  MEDICATIONS:  Current Outpatient Medications  Medication Sig Dispense Refill  . aspirin 81 MG tablet Take 1 tablet (81 mg total) by mouth daily. For Polycythemia Vera.    Marland Kitchen ketorolac (ACULAR) 0.5 % ophthalmic solution Place 1 drop into both eyes daily as needed.    . loratadine-pseudoephedrine (CLARITIN-D 12 HOUR) 5-120 MG tablet Take 1 tablet by mouth 2 (two) times daily. 20 tablet 0  . methocarbamol (ROBAXIN) 500 MG tablet TK 1 T PO Q 8 H FOR 10 DAYS PRN  1  . predniSONE (STERAPRED UNI-PAK 21 TAB) 5 MG (21) TBPK tablet See admin instructions. see package  1  . sertraline (ZOLOFT) 100 MG tablet Take 1.5 tablets (150 mg total) by mouth daily. 45 tablet 3  . traMADol (ULTRAM) 50 MG tablet TK 1 T PO Q 6 H FOR 5 DAYS PRN  0  . traZODone (DESYREL) 50 MG tablet Take 1 tablet (50 mg total) by mouth at bedtime. For sleep. 30 tablet 4   No current facility-administered medications for this visit.     REVIEW OF SYSTEMS:   Constitutional: Denies fevers, chills.  Positive for night sweats.  Positive for fatigue. Eyes: Denies blurriness of vision, double vision or watery eyes Ears, nose, mouth, throat, and face: Denies mucositis or sore throat Respiratory: Denies cough, dyspnea or wheezes Cardiovascular: Denies palpitation, chest discomfort or lower extremity swelling Gastrointestinal:  Denies nausea, heartburn or change in bowel habits Skin: Denies abnormal skin rashes Lymphatics: Denies new lymphadenopathy or  easy bruising Neurological:Denies numbness, tingling or new weaknesses Behavioral/Psych: Mood is stable, no new changes  All other systems were reviewed with the patient and are negative.  PHYSICAL EXAMINATION: ECOG PERFORMANCE STATUS: 0 - Asymptomatic  Vitals:   08/14/18 1350  BP: 121/71  Pulse: 77  Resp: 16  Temp: 98.7 F (37.1 C)  SpO2: 99%   Filed Weights   08/14/18 1350  Weight: 135 lb (61.2 kg)    GENERAL:alert, no distress and comfortable SKIN: skin color, texture, turgor are normal, no rashes or  significant lesions EYES: normal, conjunctiva are pink and non-injected, sclera clear OROPHARYNX:no exudate, no erythema and lips, buccal mucosa, and tongue normal  NECK: supple, thyroid normal size, non-tender, without nodularity LYMPH:  no palpable lymphadenopathy in the cervical, axillary or inguinal LUNGS: clear to auscultation and percussion with normal breathing effort HEART: regular rate & rhythm and no murmurs and no lower extremity edema ABDOMEN:abdomen soft, non-tender and normal bowel sounds Musculoskeletal:no cyanosis of digits and no clubbing  PSYCH: alert & oriented x 3 with fluent speech NEURO: no focal motor/sensory deficits  LABORATORY DATA:  I have reviewed the data as listed Recent Results (from the past 2160 hour(s))  Ferritin     Status: Abnormal   Collection Time: 06/04/18  9:20 AM  Result Value Ref Range   Ferritin 9 (L) 15 - 150 ng/mL  Lactate dehydrogenase     Status: Abnormal   Collection Time: 06/04/18  9:20 AM  Result Value Ref Range   LDH 118 (L) 119 - 226 IU/L  Comprehensive metabolic panel     Status: Abnormal   Collection Time: 06/04/18  9:20 AM  Result Value Ref Range   Glucose 66 65 - 99 mg/dL   BUN 3 (L) 6 - 24 mg/dL   Creatinine, Ser 0.69 0.57 - 1.00 mg/dL   GFR calc non Af Amer 102 >59 mL/min/1.73   GFR calc Af Amer 117 >59 mL/min/1.73   BUN/Creatinine Ratio 4 (L) 9 - 23   Sodium 143 134 - 144 mmol/L   Potassium 3.5 3.5 - 5.2  mmol/L   Chloride 104 96 - 106 mmol/L   CO2 25 20 - 29 mmol/L   Calcium 9.1 8.7 - 10.2 mg/dL   Total Protein 6.2 6.0 - 8.5 g/dL   Albumin 4.1 3.5 - 5.5 g/dL   Globulin, Total 2.1 1.5 - 4.5 g/dL   Albumin/Globulin Ratio 2.0 1.2 - 2.2   Bilirubin Total <0.2 0.0 - 1.2 mg/dL   Alkaline Phosphatase 69 39 - 117 IU/L   AST 10 0 - 40 IU/L   ALT 9 0 - 32 IU/L  CBC with Differential/Platelet     Status: None   Collection Time: 06/04/18  9:20 AM  Result Value Ref Range   WBC 5.2 3.4 - 10.8 x10E3/uL   RBC 4.33 3.77 - 5.28 x10E6/uL   Hemoglobin 12.9 11.1 - 15.9 g/dL   Hematocrit 40.2 34.0 - 46.6 %   MCV 93 79 - 97 fL   MCH 29.8 26.6 - 33.0 pg   MCHC 32.1 31.5 - 35.7 g/dL   RDW 14.4 12.3 - 15.4 %   Platelets 317 150 - 450 x10E3/uL   Neutrophils 54 Not Estab. %   Lymphs 35 Not Estab. %   Monocytes 8 Not Estab. %   Eos 3 Not Estab. %   Basos 0 Not Estab. %   Neutrophils Absolute 2.7 1.4 - 7.0 x10E3/uL   Lymphocytes Absolute 1.8 0.7 - 3.1 x10E3/uL   Monocytes Absolute 0.4 0.1 - 0.9 x10E3/uL   EOS (ABSOLUTE) 0.2 0.0 - 0.4 x10E3/uL   Basophils Absolute 0.0 0.0 - 0.2 x10E3/uL   Immature Granulocytes 0 Not Estab. %   Immature Grans (Abs) 0.0 0.0 - 0.1 x10E3/uL  Hemoglobin and hematocrit, blood     Status: None   Collection Time: 08/01/18  9:16 AM  Result Value Ref Range   Hemoglobin 15.0 12.0 - 15.0 g/dL   HCT 45.4 36.0 - 46.0 %    Comment: Performed at [facility], [place] [address] [place], [place] [address]    RADIOGRAPHIC STUDIES: CT scan of the abdomen and pelvis on 11/12/2016 showed normal-sized spleen.  ASSESSMENT & PLAN:  Polycythemia vera 1.  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  Did not have any history of thrombosis. -She did have constitutional symptoms including severe fatigue and night sweats and itching after hot  showers at presentation. -She was started on intermittent phlebotomies, currently receiving phlebotomy every 4 months.  Fatigue improves after each phlebotomy. -Last phlebotomy was on 08/01/2018 for hematocrit of 45.  She has followed up with Dr. Beryle Beams till now.  Will be glad to take over her care. - Going forward we will arrange for her next phlebotomies at our clinic.  She will continue aspirin 81 mg daily.  No palpable spleen on examination today.  I will do follow-up visits twice a year.  2.  Family history: - She thinks her maternal grandfather might have had a similar condition, eventually died of leukemia.  Mother had colon cancer and father had lung cancer.     All questions were answered. The patient knows to call the clinic with any problems, questions or concerns.     Derek Jack, MD 08/14/18 4:35 PM

## 2018-08-14 NOTE — Assessment & Plan Note (Addendum)
1.  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  Did not have any history of thrombosis.  She is a 30-pack-year current active smoker. -She did have constitutional symptoms including severe fatigue and night sweats and itching after hot showers at presentation. -She was started on intermittent phlebotomies, currently receiving phlebotomy every 4 months.  Fatigue improves after each phlebotomy. -Last phlebotomy was on 08/01/2018 for hematocrit of 45.  She has followed up with Dr. Beryle Beams till now.  Will be glad to take over her care. - Going forward we will arrange for her next phlebotomies at our clinic.  She will continue aspirin 81 mg daily.  No palpable spleen on examination today.  I will do follow-up visits twice a year.  2.  Family history: - She thinks her maternal grandfather might have had a similar condition, eventually died of leukemia.  Mother had colon cancer and father had lung cancer.

## 2018-08-14 NOTE — Patient Instructions (Signed)
[facility] Discharge Instructions     Thank you for choosing [facility] to provide your oncology and hematology care.  To afford each patient quality time with our provider, please arrive at least 15 minutes before your scheduled appointment time.   If you have a lab appointment with the Cancer Center please come in thru the  Main Entrance and check in at the main information desk  You need to re-schedule your appointment should you arrive 10 or more minutes late.  We strive to give you quality time with our providers, and arriving late affects you and other patients whose appointments are after yours.  Also, if you no show three or more times for appointments you may be dismissed from the clinic at the providers discretion.     Again, thank you for choosing [facility].  Our hope is that these requests will decrease the amount of time that you wait before being seen by our physicians.       _____________________________________________________________  Should you have questions after your visit to [facility], please contact our office at [phone] between the hours of 8:00 a.m. and 4:30 p.m.  Voicemails left after 4:00 p.m. will not be returned until the following business day.  For prescription refill requests, have your pharmacy contact our office and allow 72 hours.    Cancer Center Support Programs:   > Cancer Support Group  2nd Tuesday of the month 1pm-2pm, Journey Room    

## 2018-08-21 ENCOUNTER — Other Ambulatory Visit (HOSPITAL_COMMUNITY): Payer: Self-pay | Admitting: Psychiatry

## 2018-08-21 DIAGNOSIS — F429 Obsessive-compulsive disorder, unspecified: Secondary | ICD-10-CM

## 2018-08-21 DIAGNOSIS — F321 Major depressive disorder, single episode, moderate: Secondary | ICD-10-CM

## 2018-10-14 ENCOUNTER — Other Ambulatory Visit (HOSPITAL_COMMUNITY): Payer: Self-pay | Admitting: Psychiatry

## 2018-10-14 DIAGNOSIS — F418 Other specified anxiety disorders: Secondary | ICD-10-CM

## 2018-10-14 DIAGNOSIS — F5105 Insomnia due to other mental disorder: Principal | ICD-10-CM

## 2018-10-29 ENCOUNTER — Ambulatory Visit (HOSPITAL_COMMUNITY): Payer: BC Managed Care – PPO | Admitting: Psychiatry

## 2018-10-29 ENCOUNTER — Encounter (HOSPITAL_COMMUNITY): Payer: Self-pay | Admitting: Psychiatry

## 2018-10-29 VITALS — BP 123/77 | HR 93 | Ht 67.0 in | Wt 134.0 lb

## 2018-10-29 DIAGNOSIS — F429 Obsessive-compulsive disorder, unspecified: Secondary | ICD-10-CM | POA: Diagnosis not present

## 2018-10-29 DIAGNOSIS — F418 Other specified anxiety disorders: Secondary | ICD-10-CM | POA: Diagnosis not present

## 2018-10-29 DIAGNOSIS — F5105 Insomnia due to other mental disorder: Secondary | ICD-10-CM | POA: Diagnosis not present

## 2018-10-29 DIAGNOSIS — F321 Major depressive disorder, single episode, moderate: Secondary | ICD-10-CM

## 2018-10-29 MED ORDER — SERTRALINE HCL 100 MG PO TABS: 150.0000 mg | ORAL_TABLET | Freq: Every day | ORAL | 3 refills | Status: DC

## 2018-10-29 MED ORDER — TRAZODONE HCL 50 MG PO TABS: 50.0000 mg | ORAL_TABLET | Freq: Every day | ORAL | 3 refills | Status: DC

## 2018-10-29 NOTE — Progress Notes (Signed)
[facility] MD/PA/NP OP Progress Note  10/29/2018 4:34 PM Cheryl Cross  MRN:  [id number]  Chief Complaint:  Chief Complaint    Depression; Follow-up     HPI: This patient is a52 year old separated white female who lives alone in [place]. 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 She had back pain and fibromyalgia. She got hooked on taking too many Xanax and was sleeping all the time and unable to function. She was [facility] and got off the Xanax and since then has been on trazodone and Zoloft. 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 She sees Dr. Jefm Miles here which she finds very helpful. She denies being depressed and stays on a good regimen of eating and sleeping. Her mood is generally been good and she's not significantly anxious. She's not abusing any substances and she denies suicidal ideation  The patient returns after 4 months.  In general she is doing fairly well.  She is had to have injections in her neck because of degenerative joint disease but other than that her health is pretty good.  She states that her mood is good she is sleeping well her energy is good but she is getting a bit burned out on her job.  She denies any thoughts of self-harm or and denies any substance use Visit Diagnosis:    ICD-10-CM   1. Major depressive disorder, single episode, moderate (HCC) F32.1 sertraline (ZOLOFT) 100 MG tablet  2. Obsessive-compulsive disorder, unspecified type F42.9 sertraline (ZOLOFT) 100 MG tablet  3. 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 Abdominal pain, right upper quadrant 06/10/2018  . Anxiety   .  Arthritis   . Bursitis   . DDD (degenerative disc disease)   . Depression   . Fibromyalgia   . Obsessive-compulsive disorder   . Overactive bladder   . 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 PTSD (post-traumatic stress disorder)   . PVC's (premature ventricular contractions)    pt. placed on heart monitor x 24hours, everything fine   . Sesamoiditis February 2017    Past Surgical History:  Procedure Laterality Date  . ABDOMINAL HYSTERECTOMY    . ESOPHAGOGASTRODUODENOSCOPY N/A 09/10/2014   Procedure: ESOPHAGOGASTRODUODENOSCOPY (EGD);  Surgeon: Rogene Houston, MD;  [facility];  Service: Endoscopy;  Laterality: N/A;  130 - moved to 3:15 - Ann to notify  . LUMBAR FUSION    . TOOTH EXTRACTION Left Aug 2016    Family Psychiatric History: See below  Family History:  Family History  Problem Relation Age of Onset  . Cancer - Colon Mother        age 78  . Anxiety disorder Mother   . Atrial fibrillation Father   . Atrial fibrillation Brother   . Bipolar disorder Neg Hx   . Dementia Neg Hx   . Drug abuse Neg Hx   . Paranoid behavior Neg Hx   . Schizophrenia Neg Hx   . Seizures Neg Hx   . Sexual abuse Neg Hx   . Physical abuse Neg Hx     Social History:  Social History   Socioeconomic History  . Marital status: Legally Separated    Spouse  name: Not on file  . Number of children: Not on file  . Years of education: Not on file  . Highest education level: Not on file  Occupational History  . Not on file  Social Needs  . Financial resource strain: Not on file  . Food insecurity:    Worry: Not on file    Inability: Not on file  . Transportation needs:    Medical: Not on file    Non-medical: Not on file  Tobacco Use  . Smoking status: Current Every Day Smoker    Packs/day: 1.00    Years: 15.00    Pack years: 15.00    Types: Cigarettes    Start date: 11/12/1996  . Smokeless tobacco: Never Used  . Tobacco comment: 1 pack a day x 20 yrs.  Substance and  Sexual Activity  . Alcohol use: No    Alcohol/week: 0.0 standard drinks  . Drug use: No    Types: Hydrocodone, Benzodiazepines  . Sexual activity: Not on file  Lifestyle  . Physical activity:    Days per week: Not on file    Minutes per session: Not on file  . Stress: Not on file  Relationships  . Social connections:    Talks on phone: Not on file    Gets together: Not on file    Attends religious service: Not on file    Active member of club or organization: Not on file    Attends meetings of clubs or organizations: Not on file    Relationship status: Not on file  Other Topics Concern  . Not on file  Social History Narrative  . Not on file    Allergies:  Allergies  Allergen Reactions  . Cyclobenzaprine Other (See Comments)    Hot sensation  . Diclofenac Sodium Itching  . Gabapentin Hives  . Penicillins Other (See Comments)    Unknown child hood reaction  . Voltaren [Diclofenac Sodium] Itching    Metabolic Disorder Labs: No results found for: HGBA1C, MPG No results found for: PROLACTIN No results found for: CHOL, TRIG, HDL, CHOLHDL, VLDL, LDLCALC Lab Results  Component Value Date   TSH 1.490 05/28/2016   TSH 0.896 03/11/2012    Therapeutic Level Labs: No results found for: LITHIUM No results found for: VALPROATE No components found for:  CBMZ  Current Medications: Current Outpatient Medications  Medication Sig Dispense Refill  . aspirin 81 MG tablet Take 1 tablet (81 mg total) by mouth daily. For Polycythemia Vera.    Marland Kitchen ketorolac (ACULAR) 0.5 % ophthalmic solution Place 1 drop into both eyes daily as needed.    . loratadine-pseudoephedrine (CLARITIN-D 12 HOUR) 5-120 MG tablet Take 1 tablet by mouth 2 (two) times daily. 20 tablet 0  . methocarbamol (ROBAXIN) 500 MG tablet TK 1 T PO Q 8 H FOR 10 DAYS PRN  1  . predniSONE (STERAPRED UNI-PAK 21 TAB) 5 MG (21) TBPK tablet See admin instructions. see package  1  . sertraline (ZOLOFT) 100 MG tablet Take 1.5  tablets (150 mg total) by mouth daily. 45 tablet 3  . traMADol (ULTRAM) 50 MG tablet TK 1 T PO Q 6 H FOR 5 DAYS PRN  0  . traZODone (DESYREL) 50 MG tablet Take 1 tablet (50 mg total) by mouth at bedtime. 30 tablet 3   No current facility-administered medications for this visit.      Musculoskeletal: Strength & Muscle Tone: within normal limits Gait & Station: normal Patient leans: N/A  Psychiatric  Specialty Exam: Review of Systems  Musculoskeletal: Positive for neck pain.  All other systems reviewed and are negative.   Blood pressure 123/77, pulse 93, height 5\' 7"  (1.702 m), weight 134 lb (60.8 kg), SpO2 98 %.Body mass index is 20.99 kg/m.  General Appearance: Casual and Fairly Groomed  Eye Contact:  Good  Speech:  Clear and Coherent  Volume:  Normal  Mood:  Euthymic  Affect:  Appropriate and Congruent  Thought Process:  Goal Directed  Orientation:  Full (Time, Place, and Person)  Thought Content: WDL   Suicidal Thoughts:  No  Homicidal Thoughts:  No  Memory:  Immediate;   Good Recent;   Good Remote;   Good  Judgement:  Good  Insight:  Good  Psychomotor Activity:  Normal  Concentration:  Concentration: Good and Attention Span: Good  Recall:  Good  Fund of Knowledge: Good  Language: Good  Akathisia:  No  Handed:  Right  AIMS (if indicated): not done  Assets:  Communication Skills Desire for Improvement Physical Health Resilience Social Support Talents/Skills Vocational/Educational  ADL's:  Intact  Cognition: WNL  Sleep:  Good   Screenings: PHQ2-9     Office Visit from 06/10/2018 in [facility] Office Visit from 06/04/2017 in [facility] Office Visit from 05/28/2016 in [facility] Office Visit from 07/04/2015 in [facility] [place] Visit from 06/07/2014 in [place]  PHQ-2 Total Score  1  1  0  0  0       Assessment and Plan: This patient is  a 52 year old female with a history of depression.  She is doing well on her current regimen.  She will continue Zoloft 150 mg for depression and trazodone 50 mg at bedtime for sleep.  She will return to see me in 4 months   [name], MD 10/29/2018, 4:34 PM

## 2018-10-29 NOTE — Progress Notes (Deleted)
[facility] MD/PA/NP OP Progress Note  10/29/2018 4:34 PM Cheryl Cross  MRN:  [id number]  Chief Complaint:  Chief Complaint    Depression; Follow-up     HPI:  Visit Diagnosis:    ICD-10-CM   1. Major depressive disorder, single episode, moderate (HCC) F32.1 sertraline (ZOLOFT) 100 MG tablet  2. Obsessive-compulsive disorder, unspecified type F42.9 sertraline (ZOLOFT) 100 MG tablet  3. 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 Abdominal pain, right upper quadrant 06/10/2018  . Anxiety   . Arthritis   . Bursitis   . DDD (degenerative disc disease)   . Depression   . Fibromyalgia   . Obsessive-compulsive disorder   . Overactive bladder   . 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 PTSD (post-traumatic stress disorder)   . PVC's (premature ventricular contractions)    pt. placed on heart monitor x 24hours, everything fine   . Sesamoiditis February 2017    Past Surgical History:  Procedure Laterality Date  . ABDOMINAL HYSTERECTOMY    . ESOPHAGOGASTRODUODENOSCOPY N/A 09/10/2014   Procedure: ESOPHAGOGASTRODUODENOSCOPY (EGD);  Surgeon: Rogene Houston, MD;  [facility];  Service: Endoscopy;  Laterality: N/A;  130 - moved to 3:15 - Ann to notify  . LUMBAR FUSION    . TOOTH EXTRACTION Left Aug 2016    Family Psychiatric History: ***  Family History:  Family History  Problem Relation Age of Onset  . Cancer - Colon Mother        age 31  . Anxiety disorder Mother   . Atrial fibrillation Father   . Atrial fibrillation Brother   . Bipolar disorder Neg Hx   . Dementia Neg Hx   . Drug abuse Neg Hx   . Paranoid behavior Neg Hx   . Schizophrenia Neg Hx   . Seizures Neg Hx   . Sexual abuse Neg Hx   . Physical abuse Neg Hx     Social History:  Social History   Socioeconomic History  . Marital status: Legally  Separated    Spouse name: Not on file  . Number of children: Not on file  . Years of education: Not on file  . Highest education level: Not on file  Occupational History  . Not on file  Social Needs  . Financial resource strain: Not on file  . Food insecurity:    Worry: Not on file    Inability: Not on file  . Transportation needs:    Medical: Not on file    Non-medical: Not on file  Tobacco Use  . Smoking status: Current Every Day Smoker    Packs/day: 1.00    Years: 15.00    Pack years: 15.00    Types: Cigarettes    Start date: 11/12/1996  . Smokeless tobacco: Never Used  . Tobacco comment: 1 pack a day x 20 yrs.  Substance and Sexual Activity  . Alcohol use: No    Alcohol/week: 0.0 standard drinks  . Drug use: No    Types: Hydrocodone, Benzodiazepines  . Sexual activity: Not on file  Lifestyle  . Physical activity:    Days per week: Not on file    Minutes per session: Not on file  . Stress: Not on file  Relationships  . Social connections:    Talks on phone: Not on file  Gets together: Not on file    Attends religious service: Not on file    Active member of club or organization: Not on file    Attends meetings of clubs or organizations: Not on file    Relationship status: Not on file  Other Topics Concern  . Not on file  Social History Narrative  . Not on file    Allergies:  Allergies  Allergen Reactions  . Cyclobenzaprine Other (See Comments)    Hot sensation  . Diclofenac Sodium Itching  . Gabapentin Hives  . Penicillins Other (See Comments)    Unknown child hood reaction  . Voltaren [Diclofenac Sodium] Itching    Metabolic Disorder Labs: No results found for: HGBA1C, MPG No results found for: PROLACTIN No results found for: CHOL, TRIG, HDL, CHOLHDL, VLDL, LDLCALC Lab Results  Component Value Date   TSH 1.490 05/28/2016   TSH 0.896 03/11/2012    Therapeutic Level Labs: No results found for: LITHIUM No results found for: VALPROATE No  components found for:  CBMZ  Current Medications: Current Outpatient Medications  Medication Sig Dispense Refill  . aspirin 81 MG tablet Take 1 tablet (81 mg total) by mouth daily. For Polycythemia Vera.    Marland Kitchen ketorolac (ACULAR) 0.5 % ophthalmic solution Place 1 drop into both eyes daily as needed.    . loratadine-pseudoephedrine (CLARITIN-D 12 HOUR) 5-120 MG tablet Take 1 tablet by mouth 2 (two) times daily. 20 tablet 0  . methocarbamol (ROBAXIN) 500 MG tablet TK 1 T PO Q 8 H FOR 10 DAYS PRN  1  . predniSONE (STERAPRED UNI-PAK 21 TAB) 5 MG (21) TBPK tablet See admin instructions. see package  1  . sertraline (ZOLOFT) 100 MG tablet Take 1.5 tablets (150 mg total) by mouth daily. 45 tablet 3  . traMADol (ULTRAM) 50 MG tablet TK 1 T PO Q 6 H FOR 5 DAYS PRN  0  . traZODone (DESYREL) 50 MG tablet Take 1 tablet (50 mg total) by mouth at bedtime. 30 tablet 3   No current facility-administered medications for this visit.      Musculoskeletal: Strength & Muscle Tone: {desc; muscle tone:32375} Gait & Station: {PE GAIT ED NATL:22525} Patient leans: {Patient Leans:21022755}  Psychiatric Specialty Exam: ROS  Blood pressure 123/77, pulse 93, height 5\' 7"  (1.702 m), weight 134 lb (60.8 kg), SpO2 98 %.Body mass index is 20.99 kg/m.  General Appearance: {Appearance:22683}  Eye Contact:  {BHH EYE CONTACT:22684}  Speech:  {Speech:22685}  Volume:  {Volume (PAA):22686}  Mood:  {BHH MOOD:22306}  Affect:  {Affect (PAA):22687}  Thought Process:  {Thought Process (PAA):22688}  Orientation:  {BHH ORIENTATION (PAA):22689}  Thought Content: {Thought Content:22690}   Suicidal Thoughts:  {ST/HT (PAA):22692}  Homicidal Thoughts:  {ST/HT (PAA):22692}  Memory:  {BHH MEMORY:22881}  Judgement:  {Judgement (PAA):22694}  Insight:  {Insight (PAA):22695}  Psychomotor Activity:  {Psychomotor (PAA):22696}  Concentration:  {Concentration:21399}  Recall:  {BHH GOOD/FAIR/POOR:22877}  Fund of Knowledge: {BHH  GOOD/FAIR/POOR:22877}  Language: {BHH GOOD/FAIR/POOR:22877}  Akathisia:  {BHH YES OR NO:22294}  Handed:  {Handed:22697}  AIMS (if indicated): {Desc; done/not:10129}  Assets:  {Assets (PAA):22698}  ADL's:  {BHH YKD'X:83382}  Cognition: {chl bhh cognition:304700322}  Sleep:  {BHH GOOD/FAIR/POOR:22877}   Screenings: PHQ2-9     Office Visit from 06/10/2018 in [facility] Office Visit from 06/04/2017 in [facility] [place] Office Visit from 05/28/2016 in [place] Office Visit from 07/04/2015 in [facility] Office Visit from 06/07/2014  in [place]  PHQ-2 Total Score  1  1  0  0  0       Assessment and Plan: ***   [name], MD 10/29/2018, 4:34 PM

## 2018-10-30 ENCOUNTER — Ambulatory Visit: Payer: BC Managed Care – PPO | Admitting: Podiatry

## 2018-10-30 VITALS — BP 126/70 | HR 78

## 2018-10-30 DIAGNOSIS — L600 Ingrowing nail: Secondary | ICD-10-CM | POA: Diagnosis not present

## 2018-10-30 DIAGNOSIS — M779 Enthesopathy, unspecified: Secondary | ICD-10-CM

## 2018-10-30 MED ORDER — NEOMYCIN-POLYMYXIN-HC 3.5-10000-1 OT SOLN: OTIC | 0 refills | Status: DC

## 2018-11-01 NOTE — Progress Notes (Signed)
Subjective:   Patient ID: Cheryl Cross, female   DOB: 52 y.o.   MRN: [id number]   HPI Patient presents with chronic ingrown toenail of the right big toe medial border that is painful and also is having a lot of pain in the sub-first metatarsal right over left with pain upon palpation to the area that is been present for a number of months.  Patient does not smoke and likes to be active   Review of Systems  All other systems reviewed and are negative.       Objective:  Physical Exam Vitals signs and nursing note reviewed.  Constitutional:      Appearance: She is well-developed.  Pulmonary:     Effort: Pulmonary effort is normal.  Musculoskeletal: Normal range of motion.  Skin:    General: Skin is warm.  Neurological:     Mental Status: She is alert.     Neurovascular status intact muscle strength is adequate range of motion within normal limits with patient noted to have incurvated right hallux medial border that is painful when pressed and is also noted to have discomfort of the submetatarsal first bilateral with moderate rigid plantar flexion of the metatarsal itself.  Patient is found to have good digital perfusion and is well oriented x3     Assessment:  Chronic ingrown toenail deformity right hallux with pain with no indication of infection with plantar flexion of the first metatarsal bilateral with pain associated with     Plan:  H&P condition reviewed with patient having chronic plantarflexed metatarsal and ingrown toenail that was reviewed with her.  I recommended correction of the ingrown toenail and she wants surgery and I explained procedure and risk and patient wants procedure.  I infiltrated the right hallux 60 mg like Marcaine mixture remove the border exposed matrix and applied chemical 3 applications phenol 1/32 followed by alcohol lavage sterile dressing.  Gave instructions on soaks wrote prescription for Corticosporin and advised to leave the dressing on 24  hours but to take it off earlier if it were to start to throb.  Patient will be seen back and was given advised to call us if she has any issues

## 2018-11-03 ENCOUNTER — Encounter (HOSPITAL_COMMUNITY)
Admission: RE | Admit: 2018-11-03 | Discharge: 2018-11-03 | Disposition: A | Discharge status: Home / Self Care | Payer: BC Managed Care – PPO | Source: Ambulatory Visit | Attending: Hematology | Admitting: Hematology

## 2018-11-03 DIAGNOSIS — D751 Secondary polycythemia: Secondary | ICD-10-CM | POA: Insufficient documentation

## 2018-11-03 LAB — POCT HEMOGLOBIN-HEMACUE: Hemoglobin: 13.1 g/dL (ref 12.0–15.0)

## 2018-11-03 NOTE — Progress Notes (Signed)
Cheryl Cross presents today for phlebotomy per MD orders. HGB/HCT:13.1 Phlebotomy procedure started at 1141 and ended at 1147. 18.5 oz removed. Patient tolerated procedure well. Coke given to drink. Tolerated well. IV needle removed intact. drsg to site. Voices no c/o at this time.

## 2018-11-03 NOTE — Progress Notes (Signed)
Talked with Cheryl Cross in [facility]. Informed last Hgb done was 15.1 in September. Order given to repeat Hgb by doing a hemocue. Blood drawn and Hgb 13.1. Cheryl Cross in [facility] notified and she gave result to Dr Delton Coombes. Order given for phlebotomy.

## 2018-11-11 ENCOUNTER — Other Ambulatory Visit (HOSPITAL_COMMUNITY): Payer: Self-pay | Admitting: Psychiatry

## 2018-11-11 DIAGNOSIS — F5105 Insomnia due to other mental disorder: Principal | ICD-10-CM

## 2018-11-11 DIAGNOSIS — F418 Other specified anxiety disorders: Secondary | ICD-10-CM

## 2018-12-04 ENCOUNTER — Ambulatory Visit: Payer: BC Managed Care – PPO | Admitting: Orthotics

## 2018-12-04 DIAGNOSIS — M5412 Radiculopathy, cervical region: Secondary | ICD-10-CM

## 2018-12-04 DIAGNOSIS — M779 Enthesopathy, unspecified: Secondary | ICD-10-CM

## 2018-12-04 NOTE — Progress Notes (Signed)
Patient came in today to pick up custom made foot orthotics.  The goals were accomplished and the patient reported no dissatisfaction with said orthotics.  Patient was advised of breakin period and how to report any issues. 

## 2018-12-05 ENCOUNTER — Encounter (HOSPITAL_COMMUNITY): Payer: Self-pay

## 2018-12-05 ENCOUNTER — Encounter (HOSPITAL_COMMUNITY): Payer: BC Managed Care – PPO

## 2018-12-05 ENCOUNTER — Emergency Department (HOSPITAL_COMMUNITY): Payer: BC Managed Care – PPO

## 2018-12-05 ENCOUNTER — Emergency Department (HOSPITAL_COMMUNITY)
Admission: EM | Admit: 2018-12-05 | Discharge: 2018-12-05 | Disposition: A | Discharge status: Home / Self Care | Payer: BC Managed Care – PPO | Attending: Emergency Medicine | Admitting: Emergency Medicine

## 2018-12-05 ENCOUNTER — Other Ambulatory Visit: Payer: Self-pay

## 2018-12-05 DIAGNOSIS — Y92410 Unspecified street and highway as the place of occurrence of the external cause: Secondary | ICD-10-CM | POA: Insufficient documentation

## 2018-12-05 DIAGNOSIS — Y999 Unspecified external cause status: Secondary | ICD-10-CM | POA: Insufficient documentation

## 2018-12-05 DIAGNOSIS — Y939 Activity, unspecified: Secondary | ICD-10-CM | POA: Insufficient documentation

## 2018-12-05 DIAGNOSIS — F17210 Nicotine dependence, cigarettes, uncomplicated: Secondary | ICD-10-CM | POA: Diagnosis not present

## 2018-12-05 DIAGNOSIS — S161XXA Strain of muscle, fascia and tendon at neck level, initial encounter: Secondary | ICD-10-CM | POA: Insufficient documentation

## 2018-12-05 DIAGNOSIS — Z79899 Other long term (current) drug therapy: Secondary | ICD-10-CM | POA: Diagnosis not present

## 2018-12-05 DIAGNOSIS — R079 Chest pain, unspecified: Secondary | ICD-10-CM | POA: Diagnosis not present

## 2018-12-05 DIAGNOSIS — Z7982 Long term (current) use of aspirin: Secondary | ICD-10-CM | POA: Diagnosis not present

## 2018-12-05 DIAGNOSIS — S199XXA Unspecified injury of neck, initial encounter: Secondary | ICD-10-CM | POA: Diagnosis present
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
## 2018-12-05 MED ORDER — TRAMADOL HCL 50 MG PO TABS
100.0000 mg | ORAL_TABLET | Freq: Once | ORAL | Status: AC
  Administered 2018-12-05: 100 mg via ORAL
  Filled 2018-12-05: qty 2

## 2018-12-05 MED ORDER — TRAMADOL HCL 50 MG PO TABS: ORAL_TABLET | ORAL | 0 refills | Status: DC

## 2018-12-05 MED ORDER — CYCLOBENZAPRINE HCL 10 MG PO TABS
10.0000 mg | ORAL_TABLET | Freq: Once | ORAL | Status: AC
  Administered 2018-12-05: 10 mg via ORAL
  Filled 2018-12-05: qty 1

## 2018-12-05 MED ORDER — ONDANSETRON 4 MG PO TBDP
4.0000 mg | ORAL_TABLET | Freq: Once | ORAL | Status: AC
  Administered 2018-12-05: 4 mg via ORAL
  Filled 2018-12-05: qty 1

## 2018-12-05 MED ORDER — CYCLOBENZAPRINE HCL 10 MG PO TABS: 10.0000 mg | ORAL_TABLET | Freq: Three times a day (TID) | ORAL | 0 refills | Status: DC

## 2018-12-05 NOTE — ED Notes (Signed)
To Rad 

## 2018-12-05 NOTE — ED Notes (Signed)
From Rad 
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
## 2018-12-05 NOTE — Discharge Instructions (Addendum)
Your vital signs within normal limits.  Your CT scan of the cervical spine is negative for fracture or dislocation.  There is some evidence of spasm present but no other problem, in particular the spinal canal appears to be stable.  You have several areas of arthritis and degenerative disc disease present.  Particularly at the C4-C5, C5-C6 and C6-C7 area.  Please discuss this with your physician.  The x-ray of your chest is negative for any rib fracture, or evidence of lung injury.  You can expect to be sore over the next few days.  Please use Tylenol extra strength for mild pain.  Use Flexeril 3 times daily for spasm pain.  Use Ultram every 6 hours for more severe pain.  Ultram and Flexeril may cause drowsiness and lightheadedness.  Please use these medications with caution.  Do not drive a vehicle, operate machinery, drink alcohol, or participate in activities requiring concentration when taking these medications.  Please see Dr. Gerarda Fraction in the office for additional evaluation if any changes in your condition, problems, or concerns.
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
## 2018-12-05 NOTE — ED Notes (Signed)
Pt reports she desires to leave   HB is informed
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
## 2018-12-11 ENCOUNTER — Ambulatory Visit (HOSPITAL_COMMUNITY)
Admission: RE | Admit: 2018-12-11 | Discharge: 2018-12-11 | Disposition: A | Discharge status: Home / Self Care | Payer: BC Managed Care – PPO | Source: Ambulatory Visit | Attending: Internal Medicine | Admitting: Internal Medicine

## 2018-12-11 ENCOUNTER — Other Ambulatory Visit (HOSPITAL_COMMUNITY): Payer: Self-pay | Admitting: Internal Medicine

## 2018-12-11 DIAGNOSIS — R0600 Dyspnea, unspecified: Secondary | ICD-10-CM | POA: Diagnosis not present

## 2018-12-11 IMAGING — DX DG CHEST 2V
2 series · 2 of 2 positions shown · non-contrast
Comparison: [DATE] chest radiograph.

CLINICAL DATA: Dyspnea.  Mid chest discomfort.

EXAM:
CHEST - 2 VIEW

[chest pa]
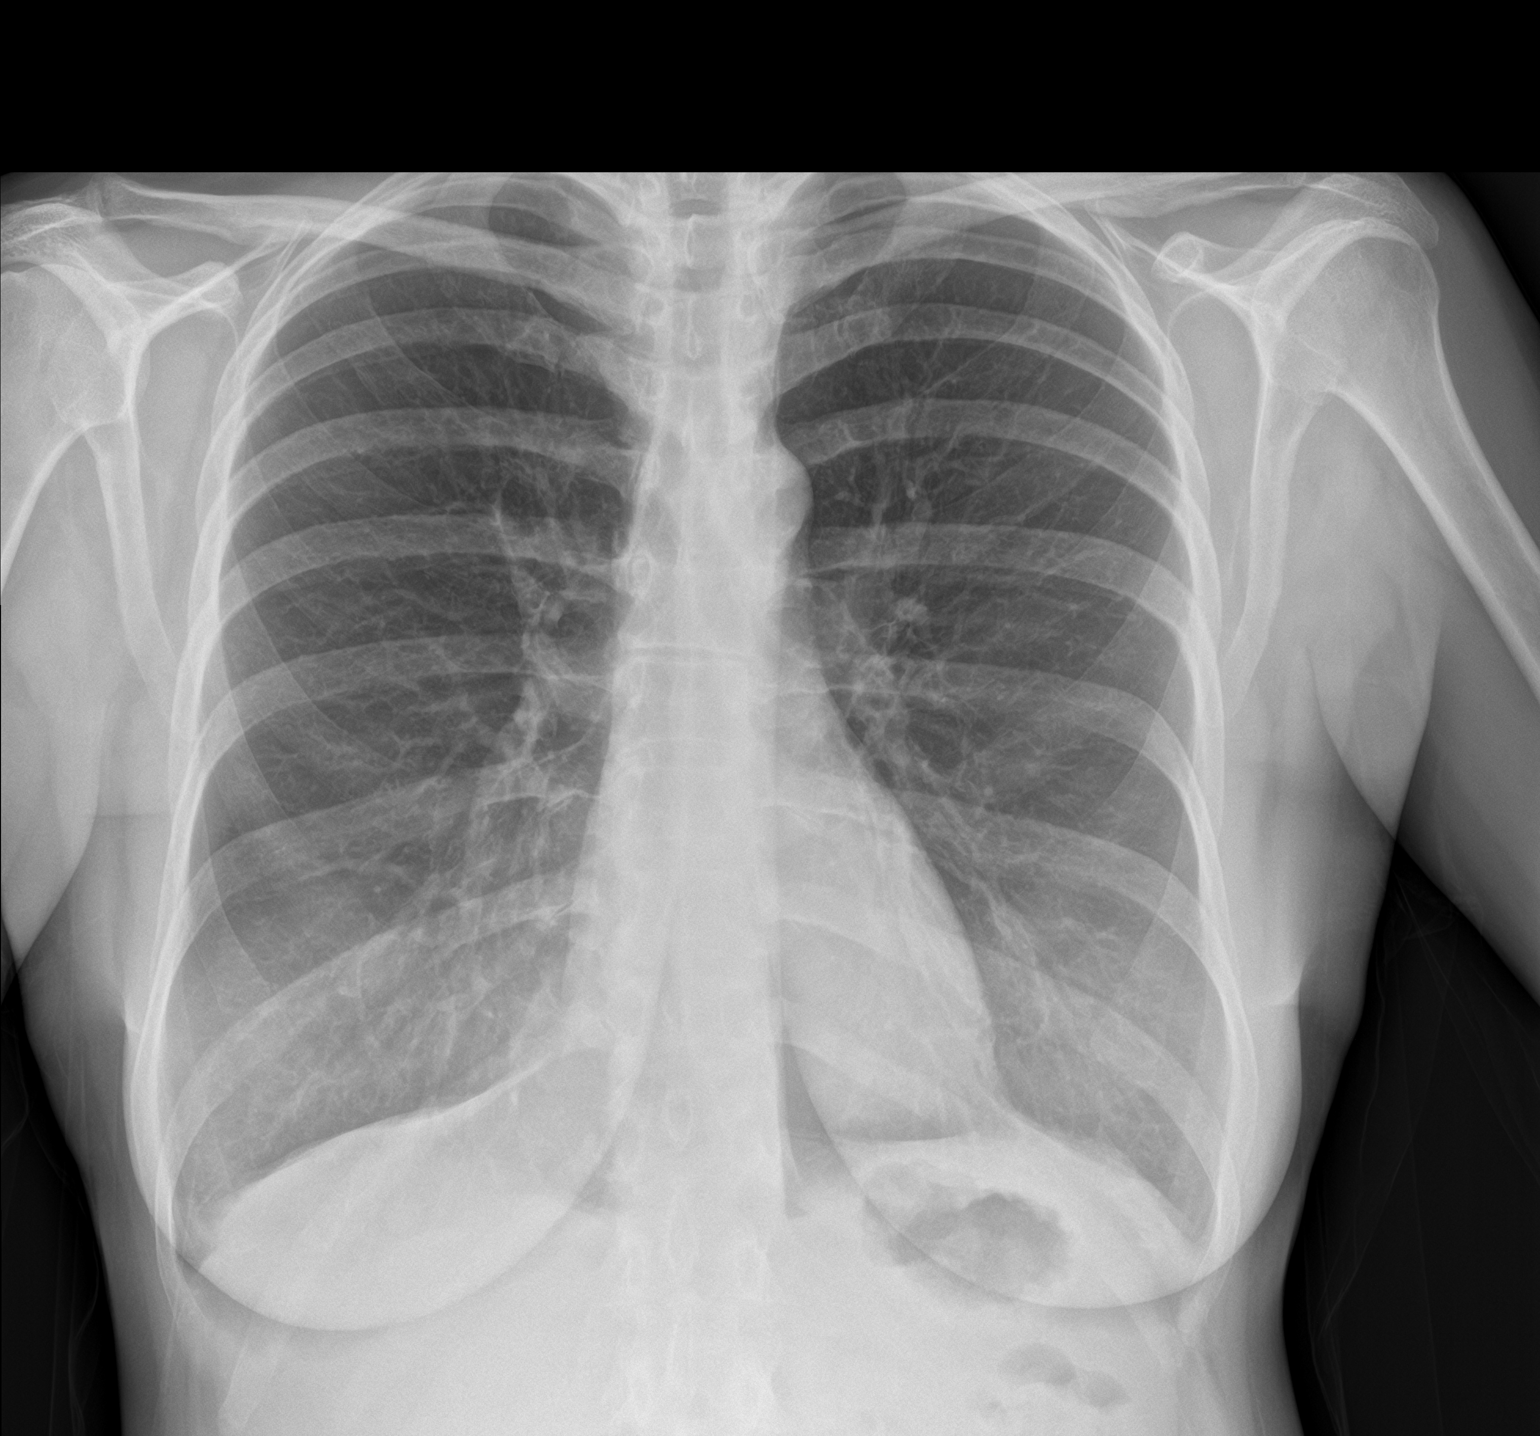

[chest lat]
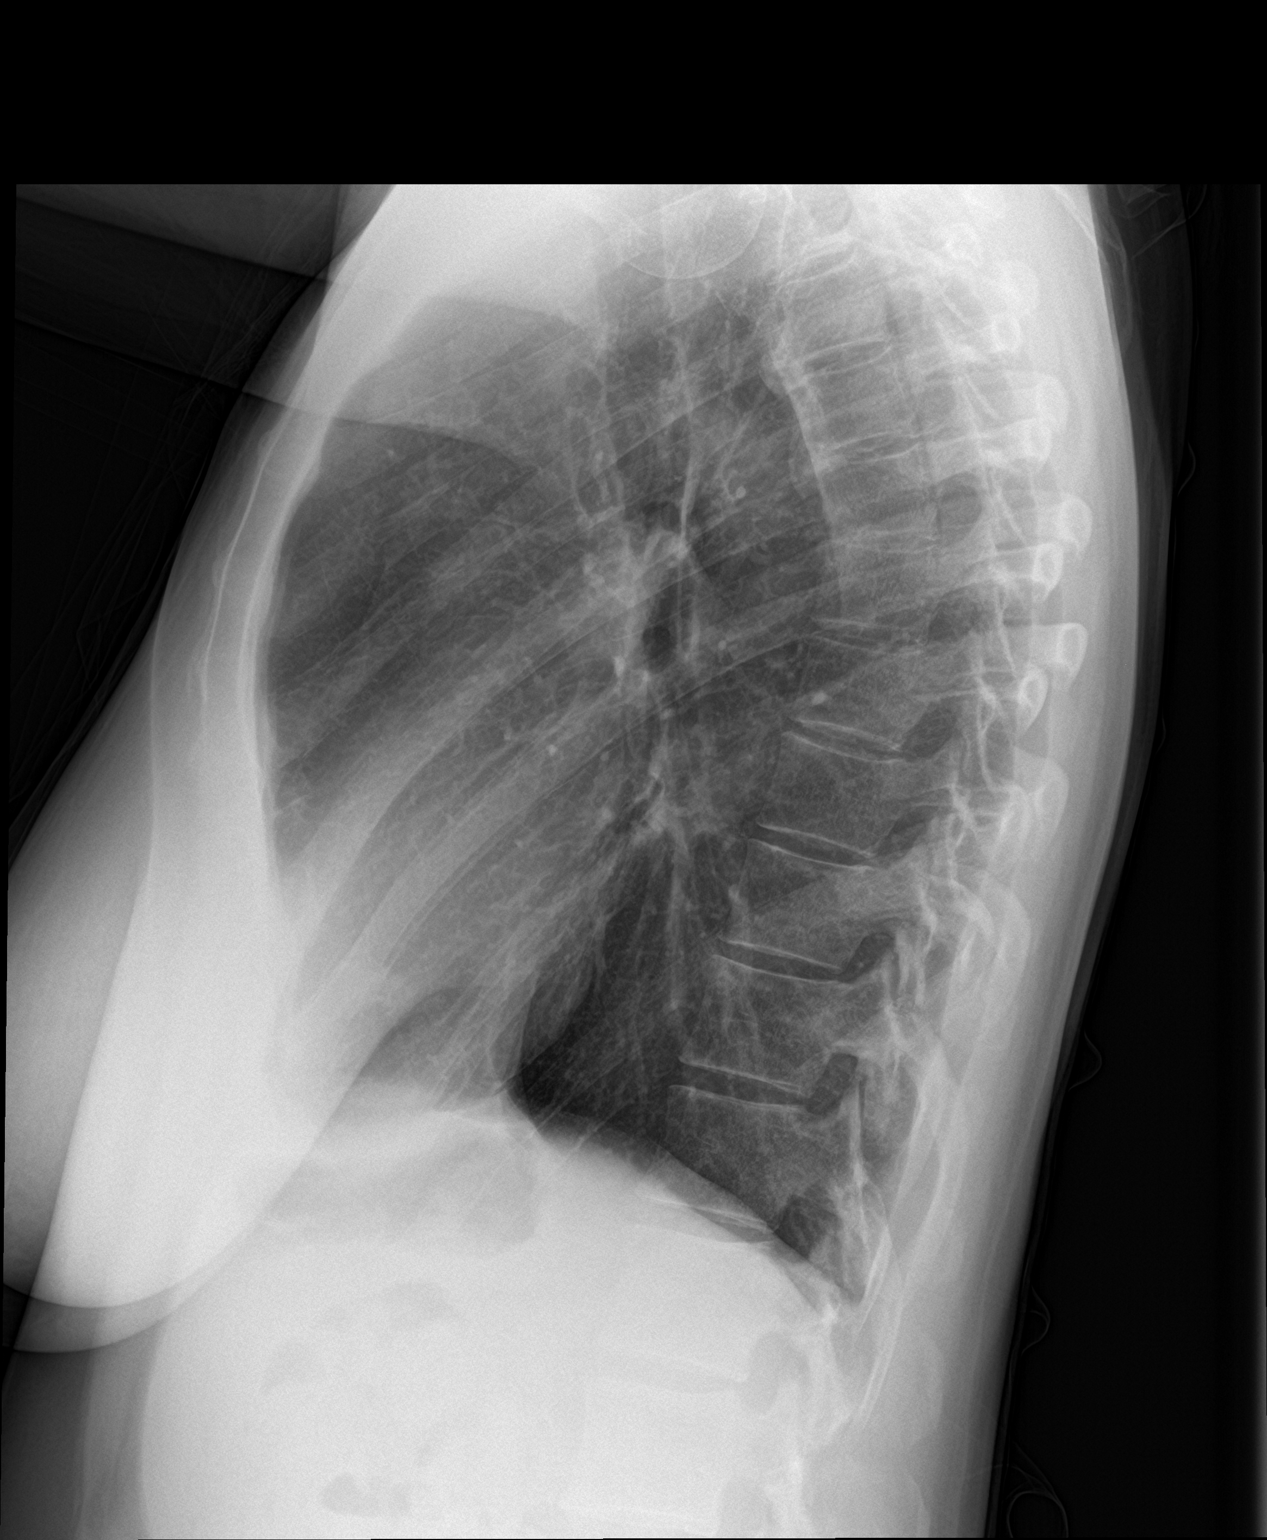

[2 of 2 positions shown; findings below may reference images not displayed]

FINDINGS: Stable cardiomediastinal silhouette with normal heart size. No
pneumothorax. No pleural effusion. Mildly hyperinflated lungs. No
pulmonary edema. No acute consolidative airspace disease.
IMPRESSION: 1. No acute cardiopulmonary disease.
2. Mildly hyperinflated lungs, suggesting COPD.

## 2019-01-26 ENCOUNTER — Encounter: Payer: Self-pay | Admitting: *Deleted

## 2019-02-09 ENCOUNTER — Other Ambulatory Visit: Payer: Self-pay

## 2019-02-09 ENCOUNTER — Inpatient Hospital Stay (HOSPITAL_COMMUNITY): Payer: BC Managed Care – PPO | Attending: Hematology

## 2019-02-09 DIAGNOSIS — D45 Polycythemia vera: Secondary | ICD-10-CM | POA: Diagnosis present

## 2019-02-09 LAB — COMPREHENSIVE METABOLIC PANEL
ALK PHOS: 64 U/L (ref 38–126)
ALT: 10 U/L (ref 0–44)
AST: 14 U/L — ABNORMAL LOW (ref 15–41)
Albumin: 4.1 g/dL (ref 3.5–5.0)
Anion gap: 7 (ref 5–15)
BUN: 5 mg/dL — ABNORMAL LOW (ref 6–20)
CALCIUM: 8.6 mg/dL — AB (ref 8.9–10.3)
CO2: 26 mmol/L (ref 22–32)
Chloride: 101 mmol/L (ref 98–111)
Creatinine, Ser: 0.69 mg/dL (ref 0.44–1.00)
GFR calc Af Amer: >60 mL/min (ref >60)
GFR calc non Af Amer: >60 mL/min (ref >60)
Glucose, Bld: 102 mg/dL — ABNORMAL HIGH (ref 70–99)
Potassium: 3.9 mmol/L (ref 3.5–5.1)
Sodium: 134 mmol/L — ABNORMAL LOW (ref 135–145)
TOTAL PROTEIN: 6.7 g/dL (ref 6.5–8.1)
Total Bilirubin: 0.3 mg/dL (ref 0.3–1.2)

## 2019-02-09 LAB — CBC WITH DIFFERENTIAL/PLATELET
ABS IMMATURE GRANULOCYTES: 0.02 10*3/uL (ref 0.00–0.07)
Basophils Absolute: 0 10*3/uL (ref 0.0–0.1)
Basophils Relative: 0 %
Eosinophils Absolute: 0.1 10*3/uL (ref 0.0–0.5)
Eosinophils Relative: 1 %
HEMATOCRIT: 40 % (ref 36.0–46.0)
Hemoglobin: 12.8 g/dL (ref 12.0–15.0)
Immature Granulocytes: 0 %
LYMPHS ABS: 2.1 10*3/uL (ref 0.7–4.0)
Lymphocytes Relative: 29 %
MCH: 29.4 pg (ref 26.0–34.0)
MCHC: 32 g/dL (ref 30.0–36.0)
MCV: 91.7 fL (ref 80.0–100.0)
MONOS PCT: 7 %
Monocytes Absolute: 0.5 10*3/uL (ref 0.1–1.0)
Neutro Abs: 4.5 10*3/uL (ref 1.7–7.7)
Neutrophils Relative %: 63 %
Platelets: 258 10*3/uL (ref 150–400)
RBC: 4.36 MIL/uL (ref 3.87–5.11)
RDW: 15.1 % (ref 11.5–15.5)
WBC: 7.3 10*3/uL (ref 4.0–10.5)
nRBC: 0 % (ref 0.0–0.2)

## 2019-02-09 LAB — FERRITIN: Ferritin: 6 ng/mL — ABNORMAL LOW (ref 11–307)

## 2019-02-09 LAB — LACTATE DEHYDROGENASE: LDH: 103 U/L (ref 98–192)

## 2019-02-16 ENCOUNTER — Telehealth (HOSPITAL_COMMUNITY): Payer: Self-pay | Admitting: *Deleted

## 2019-02-16 ENCOUNTER — Ambulatory Visit (HOSPITAL_COMMUNITY): Payer: Self-pay | Admitting: Nurse Practitioner

## 2019-02-16 ENCOUNTER — Ambulatory Visit (HOSPITAL_COMMUNITY): Payer: Self-pay | Admitting: Hematology

## 2019-02-16 ENCOUNTER — Inpatient Hospital Stay (HOSPITAL_COMMUNITY): Payer: BC Managed Care – PPO | Attending: Hematology | Admitting: Nurse Practitioner

## 2019-02-16 DIAGNOSIS — D751 Secondary polycythemia: Secondary | ICD-10-CM

## 2019-02-16 DIAGNOSIS — F17210 Nicotine dependence, cigarettes, uncomplicated: Secondary | ICD-10-CM

## 2019-02-16 DIAGNOSIS — D45 Polycythemia vera: Secondary | ICD-10-CM | POA: Insufficient documentation

## 2019-02-16 DIAGNOSIS — Z7982 Long term (current) use of aspirin: Secondary | ICD-10-CM

## 2019-02-16 NOTE — Progress Notes (Signed)
Virtual Visit via Telephone Note  I connected with Cheryl Cross on 02/16/19 at 1130 AM EDT by telephone and verified that I am speaking with the correct person using two identifiers.   I discussed the limitations, risks, security and privacy concerns of performing an evaluation and management service by telephone and the availability of in person appointments. I also discussed with the patient that there may be a patient responsible charge related to this service. The patient expressed understanding and agreed to proceed.   History of Present Illness: - Patient opted for a telephone visit today.  She reports she has been doing well since her last visit.  She reports she is starting to have more hot flashes which usually indicates she needs a phlebotomy. Denies any nausea, vomiting, or diarrhea. Denies any new pains. Had not noticed any recent bleeding such as epistaxis, hematuria or hematochezia. Denies recent chest pain on exertion, shortness of breath on minimal exertion, pre-syncopal episodes, or palpitations. Denies any numbness or tingling in hands or feet. Denies any recent fevers, infections, or recent hospitalizations. Patient reports appetite at 100% and energy level at 75%.  She is eating well and maintaining her weight at this time.    Observations/Objective: -Patient was alert and oriented - Physical exam deferred due to telephone visit.  Assessment and Plan: 1.  Jak 2- polycythemia: - She was diagnosed with polycythemia, Jak 2 V6 1 7 F negative. -Bone marrow biopsy was done on 07/12/2005 showed hypercellular marrow consistent with polycythemia. - She is a 30-pack-year active current smoker.  Does not have a history of thrombosis. - She reports her hot flashes have increased over the past 3 weeks which happens when she needs a phlebotomy.  She denies any night sweats or itching after hot showers. - She has had phlebotomies every 4 months since diagnosis.  Her symptoms improve  after phlebotomies. -She continues to take aspirin 81 mg daily. - Labs on 02/09/2019 showed her hemoglobin at 12.8 and her hematocrit at 40. - She wished to wait 3 weeks and repeat labs.  We will order a phlebotomy at that time if she needs it. - She will follow-up in the clinic in 4 months with repeat labs.  Follow Up Instructions: -Follow-up in the clinic in 4 months with repeat labs. - Continue taking aspirin 81 mg daily. - Smoking cessation education.   I discussed the assessment and treatment plan with the patient. The patient was provided an opportunity to ask questions and all were answered. The patient agreed with the plan and demonstrated an understanding of the instructions.   The patient was advised to call back or seek an in-person evaluation if the symptoms worsen or if the condition fails to improve as anticipated.  I provided 20 minutes of non-face-to-face time during this encounter.   Glennie Isle, NP-C

## 2019-02-16 NOTE — Telephone Encounter (Signed)
Tried to call pt for over the phone office visit. Pt did not answer left a voicemail for pt to call back to office.

## 2019-02-27 ENCOUNTER — Other Ambulatory Visit (HOSPITAL_COMMUNITY): Payer: Self-pay | Admitting: Psychiatry

## 2019-02-27 DIAGNOSIS — F5105 Insomnia due to other mental disorder: Principal | ICD-10-CM

## 2019-02-27 DIAGNOSIS — F418 Other specified anxiety disorders: Secondary | ICD-10-CM

## 2019-03-04 ENCOUNTER — Encounter (HOSPITAL_COMMUNITY): Payer: Self-pay | Admitting: Psychiatry

## 2019-03-04 ENCOUNTER — Ambulatory Visit (INDEPENDENT_AMBULATORY_CARE_PROVIDER_SITE_OTHER): Payer: BC Managed Care – PPO | Admitting: Psychiatry

## 2019-03-04 ENCOUNTER — Other Ambulatory Visit: Payer: Self-pay

## 2019-03-04 DIAGNOSIS — F321 Major depressive disorder, single episode, moderate: Secondary | ICD-10-CM

## 2019-03-04 DIAGNOSIS — F5105 Insomnia due to other mental disorder: Secondary | ICD-10-CM

## 2019-03-04 DIAGNOSIS — F429 Obsessive-compulsive disorder, unspecified: Secondary | ICD-10-CM | POA: Diagnosis not present

## 2019-03-04 DIAGNOSIS — F418 Other specified anxiety disorders: Secondary | ICD-10-CM | POA: Diagnosis not present

## 2019-03-04 MED ORDER — SERTRALINE HCL 100 MG PO TABS: 150.0000 mg | ORAL_TABLET | Freq: Every day | ORAL | 3 refills | Status: DC

## 2019-03-04 MED ORDER — TRAZODONE HCL 50 MG PO TABS: ORAL_TABLET | ORAL | 3 refills | Status: DC

## 2019-03-04 NOTE — Progress Notes (Signed)
Virtual Visit via Video Note  I connected with Cheryl Cross on 03/04/19 at  4:20 PM EDT by a video enabled telemedicine application and verified that I am speaking with the correct person using two identifiers.   I discussed the limitations of evaluation and management by telemedicine and the availability of in person appointments. The patient expressed understanding and agreed to proceed.      I discussed the assessment and treatment plan with the patient. The patient was provided an opportunity to ask questions and all were answered. The patient agreed with the plan and demonstrated an understanding of the instructions.   The patient was advised to call back or seek an in-person evaluation if the symptoms worsen or if the condition fails to improve as anticipated.  I provided 15 minutes of non-face-to-face time during this encounter.   [name], MD  [facility] MD/PA/NP OP Progress Note  03/04/2019 3:58 PM Cheryl Cross  MRN:  [id number]  Chief Complaint:  Chief Complaint    Depression; Follow-up     JOA:CZYS patient is a53 year old separated white female who lives with her parents in [place]. 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 She had back pain and fibromyalgia. She got hooked on taking too many Xanax and was sleeping all the time and unable to function. She [facility] and got off the Xanax and since then has been on trazodone and Zoloft. 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 She sees Dr. Jefm Miles here which she finds very helpful. She denies being depressed and stays on a good regimen of eating and sleeping. Her mood is generally been good and she's not significantly anxious. She's not abusing any substances and she denies suicidal ideation  Patient returns for follow-up after 4 months.  She  is seen via video telemedicine due to the coronavirus pandemic.  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  She has become more frustrated lately but denies being seriously depressed or anxious.  She knows she needs to get outside more and get more exercise.  She is also trying to eat more healthy food.  She is generally sleeping well with the trazodone.  She is not using drugs or alcohol.  She is living with her parents and helping them by running errands getting groceries etc.  She still feels that the medications have been helpful Visit Diagnosis:    ICD-10-CM   1. 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 Major depressive disorder, single episode, moderate (HCC) F32.1 sertraline (ZOLOFT) 100 MG tablet  3. Obsessive-compulsive disorder, unspecified type F42.9 sertraline (ZOLOFT) 100 MG tablet    Past Psychiatric History: Psychiatric hospitalization for benzodiazepine abuse in 2013 Past Medical History:  Past Medical History:  Diagnosis Date  . Abdominal pain, right upper quadrant 06/10/2018  . Anxiety   . Arthritis   . Bursitis   . DDD (degenerative disc disease)   . Depression   . Fibromyalgia   . Obsessive-compulsive disorder   . Overactive bladder   . 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 PTSD (post-traumatic stress disorder)   . PVC's (premature ventricular contractions)    pt. placed  on heart monitor x 24hours, everything fine   . Sesamoiditis February 2017    Past Surgical History:  Procedure Laterality Date  . ABDOMINAL HYSTERECTOMY    . ESOPHAGOGASTRODUODENOSCOPY N/A 09/10/2014   Procedure: ESOPHAGOGASTRODUODENOSCOPY (EGD);  Surgeon: Rogene Houston, MD;  Location[facility];  Service: Endoscopy;  Laterality: N/A;  130 - moved to 3:15 - Ann to notify  . LUMBAR FUSION    .  TOOTH EXTRACTION Left Aug 2016    Family Psychiatric History: see below  Family History:  Family History  Problem Relation Age of Onset  . Cancer - Colon Mother        age 13  . Anxiety disorder Mother   . Atrial fibrillation Father   . Atrial fibrillation Brother   . Bipolar disorder Neg Hx   . Dementia Neg Hx   . Drug abuse Neg Hx   . Paranoid behavior Neg Hx   . Schizophrenia Neg Hx   . Seizures Neg Hx   . Sexual abuse Neg Hx   . Physical abuse Neg Hx     Social History:  Social History   Socioeconomic History  . Marital status: Married    Spouse name: Not on file  . Number of children: Not on file  . Years of education: Not on file  . Highest education level: Not on file  Occupational History  . Not on file  Social Needs  . Financial resource strain: Not on file  . Food insecurity:    Worry: Not on file    Inability: Not on file  . Transportation needs:    Medical: Not on file    Non-medical: Not on file  Tobacco Use  . Smoking status: Current Every Day Smoker    Packs/day: 1.00    Years: 15.00    Pack years: 15.00    Types: Cigarettes    Start date: 11/12/1996  . Smokeless tobacco: Never Used  . Tobacco comment: 1 pack a day x 20 yrs.  Substance and Sexual Activity  . Alcohol use: No    Alcohol/week: 0.0 standard drinks  . Drug use: No    Types: Hydrocodone, Benzodiazepines  . Sexual activity: Not on file  Lifestyle  . Physical activity:    Days per week: Not on file    Minutes per session: Not on file  . Stress: Not on file  Relationships  . Social connections:    Talks on phone: Not on file    Gets together: Not on file    Attends religious service: Not on file    Active member of club or organization: Not on file    Attends meetings of clubs or organizations: Not on file    Relationship status: Not on file  Other Topics Concern  . Not on file  Social History Narrative  . Not on file    Allergies:  Allergies  Allergen Reactions  .  Cyclobenzaprine Other (See Comments)    Hot sensation  . Diclofenac Sodium Itching  . Gabapentin Hives  . Hydrocodone-Acetaminophen Nausea Only  . Penicillins Other (See Comments)    Unknown child hood reaction  . Voltaren [Diclofenac Sodium] Itching    Metabolic Disorder Labs: No results found for: HGBA1C, MPG No results found for: PROLACTIN No results found for: CHOL, TRIG, HDL, CHOLHDL, VLDL, LDLCALC Lab Results  Component Value Date   TSH 1.490 05/28/2016   TSH 0.896 03/11/2012    Therapeutic Level Labs: No results found for: LITHIUM  No results found for: VALPROATE No components found for:  CBMZ  Current Medications: Current Outpatient Medications  Medication Sig Dispense Refill  . aspirin 81 MG tablet Take 1 tablet (81 mg total) by mouth daily. For Polycythemia Vera.    . cyclobenzaprine (FLEXERIL) 10 MG tablet Take 1 tablet (10 mg total) by mouth 3 (three) times daily. 20 tablet 0  . ketorolac (ACULAR) 0.5 % ophthalmic solution Place 1 drop into both eyes daily as needed.    . loratadine-pseudoephedrine (CLARITIN-D 12 HOUR) 5-120 MG tablet Take 1 tablet by mouth 2 (two) times daily. (Patient taking differently: Take 1 tablet by mouth 2 (two) times daily as needed. ) 20 tablet 0  . methocarbamol (ROBAXIN) 500 MG tablet TK 1 T PO Q 8 H FOR 10 DAYS PRN  1  . neomycin-polymyxin-hydrocortisone (CORTISPORIN) OTIC solution Apply 1-2 drops to toe after soaking twice a day 10 mL 0  . predniSONE (STERAPRED UNI-PAK 21 TAB) 5 MG (21) TBPK tablet See admin instructions. see package  1  . sertraline (ZOLOFT) 100 MG tablet Take 1.5 tablets (150 mg total) by mouth daily. 45 tablet 3  . traMADol (ULTRAM) 50 MG tablet 1 or 2 po q6h prn pain 12 tablet 0  . traZODone (DESYREL) 50 MG tablet TAKE 1 TABLET(50 MG) BY MOUTH AT BEDTIME 30 tablet 3   No current facility-administered medications for this visit.      Musculoskeletal: Strength & Muscle Tone: normal Gait & Station:  normal Patient leans: N/A  Psychiatric Specialty Exam: Review of Systems  Musculoskeletal: Positive for neck pain.  All other systems reviewed and are negative.   There were no vitals taken for this visit.There is no height or weight on file to calculate BMI.  General Appearance: Casual and Fairly Groomed  Eye Contact:  Good  Speech:  Clear and Coherent  Volume:  Normal  Mood:  Euthymic  Affect:  Congruent  Thought Process:  Goal Directed  Orientation:  Full (Time, Place, and Person)  Thought Content: Logical   Suicidal Thoughts:  No  Homicidal Thoughts:  No  Memory:  Immediate;   Good Recent;   Good Remote;   Good  Judgement:  Good  Insight:  Good  Psychomotor Activity:  Normal  Concentration:  Concentration: Good and Attention Span: Good  Recall:  Good  Fund of Knowledge: Good  Language: Good  Akathisia:  No  Handed:  Right  AIMS (if indicated): not done  Assets:  Communication Skills Desire for Improvement Physical Health Resilience Social Support Talents/Skills  ADL's:  Intact  Cognition: WNL  Sleep:  Good   Screenings: PHQ2-9     Office Visit from 06/10/2018 in [place] Office Visit from 06/04/2017 in [place] Office Visit from 05/28/2016 in [place] Office Visit from 07/04/2015 in [place] Office Visit from 06/07/2014 in [place]  PHQ-2 Total Score  1  1  0  0  0       Assessment and Plan:  This patient is a 53 year old female with a history of depression and a remote history of substance abuse.  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  She will continue Zoloft 150 mg daily for depression and trazodone 50 mg at bedtime for sleep.  She will return to see me in 4 months  [name], MD 03/04/2019, 3:58 PM

## 2019-03-06 ENCOUNTER — Other Ambulatory Visit (HOSPITAL_COMMUNITY): Payer: Self-pay | Admitting: Nurse Practitioner

## 2019-03-06 ENCOUNTER — Other Ambulatory Visit (HOSPITAL_COMMUNITY): Payer: Self-pay

## 2019-03-06 DIAGNOSIS — D751 Secondary polycythemia: Secondary | ICD-10-CM

## 2019-03-06 DIAGNOSIS — D45 Polycythemia vera: Secondary | ICD-10-CM

## 2019-03-09 ENCOUNTER — Other Ambulatory Visit: Payer: Self-pay

## 2019-03-09 ENCOUNTER — Inpatient Hospital Stay (HOSPITAL_COMMUNITY): Payer: BC Managed Care – PPO

## 2019-03-09 DIAGNOSIS — D45 Polycythemia vera: Secondary | ICD-10-CM

## 2019-03-09 LAB — CBC WITH DIFFERENTIAL/PLATELET
Abs Immature Granulocytes: 0.02 10*3/uL (ref 0.00–0.07)
Basophils Absolute: 0 10*3/uL (ref 0.0–0.1)
Basophils Relative: 1 %
Eosinophils Absolute: 0.1 10*3/uL (ref 0.0–0.5)
Eosinophils Relative: 1 %
HCT: 40.9 % (ref 36.0–46.0)
Hemoglobin: 13.4 g/dL (ref 12.0–15.0)
Immature Granulocytes: 0 %
Lymphocytes Relative: 29 %
Lymphs Abs: 1.9 10*3/uL (ref 0.7–4.0)
MCH: 30 pg (ref 26.0–34.0)
MCHC: 32.8 g/dL (ref 30.0–36.0)
MCV: 91.5 fL (ref 80.0–100.0)
Monocytes Absolute: 0.6 10*3/uL (ref 0.1–1.0)
Monocytes Relative: 9 %
Neutro Abs: 3.8 10*3/uL (ref 1.7–7.7)
Neutrophils Relative %: 60 %
Platelets: 240 10*3/uL (ref 150–400)
RBC: 4.47 MIL/uL (ref 3.87–5.11)
RDW: 14.7 % (ref 11.5–15.5)
WBC: 6.4 10*3/uL (ref 4.0–10.5)
nRBC: 0 % (ref 0.0–0.2)

## 2019-03-09 LAB — COMPREHENSIVE METABOLIC PANEL
ALT: 10 U/L (ref 0–44)
AST: 12 U/L — ABNORMAL LOW (ref 15–41)
Albumin: 4.1 g/dL (ref 3.5–5.0)
Alkaline Phosphatase: 62 U/L (ref 38–126)
Anion gap: 7 (ref 5–15)
BUN: 5 mg/dL — ABNORMAL LOW (ref 6–20)
CO2: 27 mmol/L (ref 22–32)
Calcium: 8.7 mg/dL — ABNORMAL LOW (ref 8.9–10.3)
Chloride: 104 mmol/L (ref 98–111)
Creatinine, Ser: 0.71 mg/dL (ref 0.44–1.00)
GFR calc Af Amer: >60 mL/min (ref >60)
GFR calc non Af Amer: >60 mL/min (ref >60)
Glucose, Bld: 94 mg/dL (ref 70–99)
Potassium: 3.7 mmol/L (ref 3.5–5.1)
Sodium: 138 mmol/L (ref 135–145)
Total Bilirubin: 0.4 mg/dL (ref 0.3–1.2)
Total Protein: 6.7 g/dL (ref 6.5–8.1)

## 2019-03-12 ENCOUNTER — Telehealth (HOSPITAL_COMMUNITY): Payer: Self-pay | Admitting: *Deleted

## 2019-03-12 NOTE — Telephone Encounter (Signed)
Pt called into office requesting her lab results from 03/09/19. Tried calling patient back no answer. Left message for patient to call back to the cancer center.

## 2019-03-13 ENCOUNTER — Telehealth (HOSPITAL_COMMUNITY): Payer: Self-pay | Admitting: *Deleted

## 2019-03-13 NOTE — Telephone Encounter (Signed)
Pt returned call back to the office to get lab results from 03/09/19. Provider reviewed lab results and stated her HCT was 40.9 and  patient did not need a phlebotomy. Returned call back to pt to let her know what the provider stated.Pt stated she was experiencing night sweats and bone pain and wanted to come for a phlebotomy. Provider stated pt was okay to wait until next appointment for phlebotomy due to the patient's lab results. Pt stated she would like to come earlier than next appointment time to get phlebotomy. Left a message for provider to call pt Monday to follow-up.

## 2019-05-18 ENCOUNTER — Other Ambulatory Visit (HOSPITAL_COMMUNITY): Payer: Self-pay | Admitting: Internal Medicine

## 2019-05-18 DIAGNOSIS — Z1231 Encounter for screening mammogram for malignant neoplasm of breast: Secondary | ICD-10-CM

## 2019-05-21 ENCOUNTER — Other Ambulatory Visit: Payer: Self-pay

## 2019-05-21 ENCOUNTER — Inpatient Hospital Stay (HOSPITAL_COMMUNITY): Payer: BC Managed Care – PPO | Attending: Hematology

## 2019-05-21 ENCOUNTER — Ambulatory Visit (HOSPITAL_COMMUNITY)
Admission: RE | Admit: 2019-05-21 | Discharge: 2019-05-21 | Disposition: A | Discharge status: Home / Self Care | Payer: BC Managed Care – PPO | Source: Ambulatory Visit | Attending: Internal Medicine | Admitting: Internal Medicine

## 2019-05-21 DIAGNOSIS — R51 Headache: Secondary | ICD-10-CM | POA: Insufficient documentation

## 2019-05-21 DIAGNOSIS — D751 Secondary polycythemia: Secondary | ICD-10-CM

## 2019-05-21 DIAGNOSIS — Z1231 Encounter for screening mammogram for malignant neoplasm of breast: Secondary | ICD-10-CM | POA: Diagnosis not present

## 2019-05-21 DIAGNOSIS — D45 Polycythemia vera: Secondary | ICD-10-CM | POA: Insufficient documentation

## 2019-05-21 DIAGNOSIS — Z720 Tobacco use: Secondary | ICD-10-CM | POA: Insufficient documentation

## 2019-05-21 DIAGNOSIS — R61 Generalized hyperhidrosis: Secondary | ICD-10-CM | POA: Insufficient documentation

## 2019-05-21 DIAGNOSIS — R5383 Other fatigue: Secondary | ICD-10-CM | POA: Diagnosis not present

## 2019-05-21 LAB — CBC WITH DIFFERENTIAL/PLATELET
Abs Immature Granulocytes: 0.01 10*3/uL (ref 0.00–0.07)
Basophils Absolute: 0 10*3/uL (ref 0.0–0.1)
Basophils Relative: 0 %
Eosinophils Absolute: 0.1 10*3/uL (ref 0.0–0.5)
Eosinophils Relative: 2 %
HCT: 43.8 % (ref 36.0–46.0)
Hemoglobin: 14.4 g/dL (ref 12.0–15.0)
Immature Granulocytes: 0 %
Lymphocytes Relative: 30 %
Lymphs Abs: 1.7 10*3/uL (ref 0.7–4.0)
MCH: 31 pg (ref 26.0–34.0)
MCHC: 32.9 g/dL (ref 30.0–36.0)
MCV: 94.2 fL (ref 80.0–100.0)
Monocytes Absolute: 0.5 10*3/uL (ref 0.1–1.0)
Monocytes Relative: 8 %
Neutro Abs: 3.4 10*3/uL (ref 1.7–7.7)
Neutrophils Relative %: 60 %
Platelets: 276 10*3/uL (ref 150–400)
RBC: 4.65 MIL/uL (ref 3.87–5.11)
RDW: 13.6 % (ref 11.5–15.5)
WBC: 5.8 10*3/uL (ref 4.0–10.5)
nRBC: 0 % (ref 0.0–0.2)

## 2019-05-21 LAB — COMPREHENSIVE METABOLIC PANEL
ALT: 11 U/L (ref 0–44)
AST: 12 U/L — ABNORMAL LOW (ref 15–41)
Albumin: 4.5 g/dL (ref 3.5–5.0)
Alkaline Phosphatase: 67 U/L (ref 38–126)
Anion gap: 11 (ref 5–15)
BUN: 5 mg/dL — ABNORMAL LOW (ref 6–20)
CO2: 27 mmol/L (ref 22–32)
Calcium: 9.4 mg/dL (ref 8.9–10.3)
Chloride: 103 mmol/L (ref 98–111)
Creatinine, Ser: 0.68 mg/dL (ref 0.44–1.00)
GFR calc Af Amer: >60 mL/min (ref >60)
GFR calc non Af Amer: >60 mL/min (ref >60)
Glucose, Bld: 83 mg/dL (ref 70–99)
Potassium: 3.8 mmol/L (ref 3.5–5.1)
Sodium: 141 mmol/L (ref 135–145)
Total Bilirubin: 0.5 mg/dL (ref 0.3–1.2)
Total Protein: 7.2 g/dL (ref 6.5–8.1)

## 2019-05-21 LAB — LACTATE DEHYDROGENASE: LDH: 110 U/L (ref 98–192)

## 2019-05-21 LAB — FERRITIN: Ferritin: 10 ng/mL — ABNORMAL LOW (ref 11–307)

## 2019-05-21 IMAGING — MG DIGITAL SCREENING BILATERAL MAMMOGRAM WITH TOMO AND CAD
8 series · 8 of 24 positions shown · non-contrast
Comparison: Previous exam(s).

CLINICAL DATA: Screening.

EXAM:
DIGITAL SCREENING BILATERAL MAMMOGRAM WITH TOMO AND CAD

[R CC synth-2D]
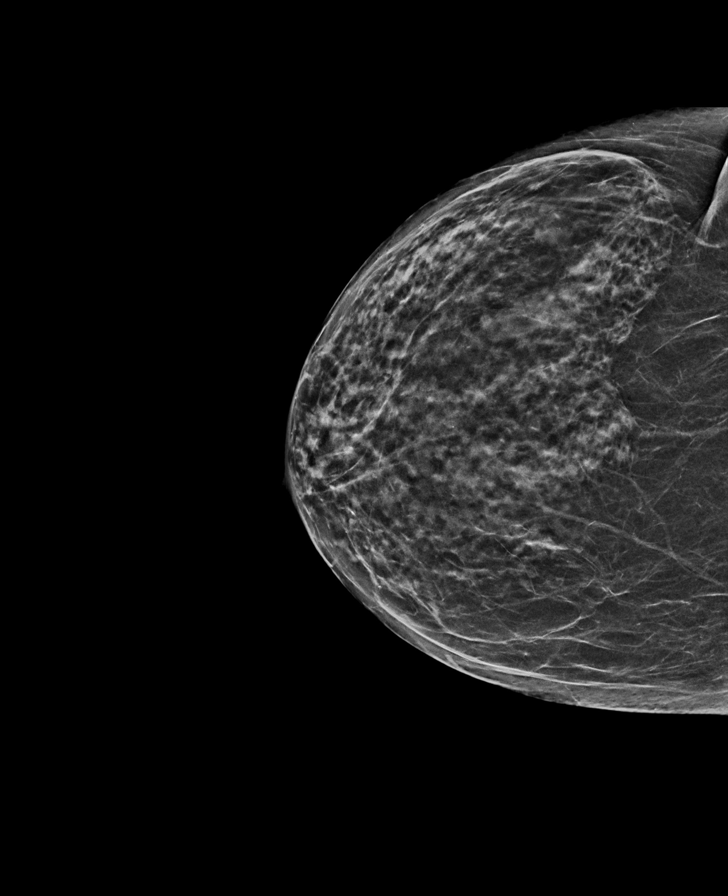

[L MLO synth-2D]
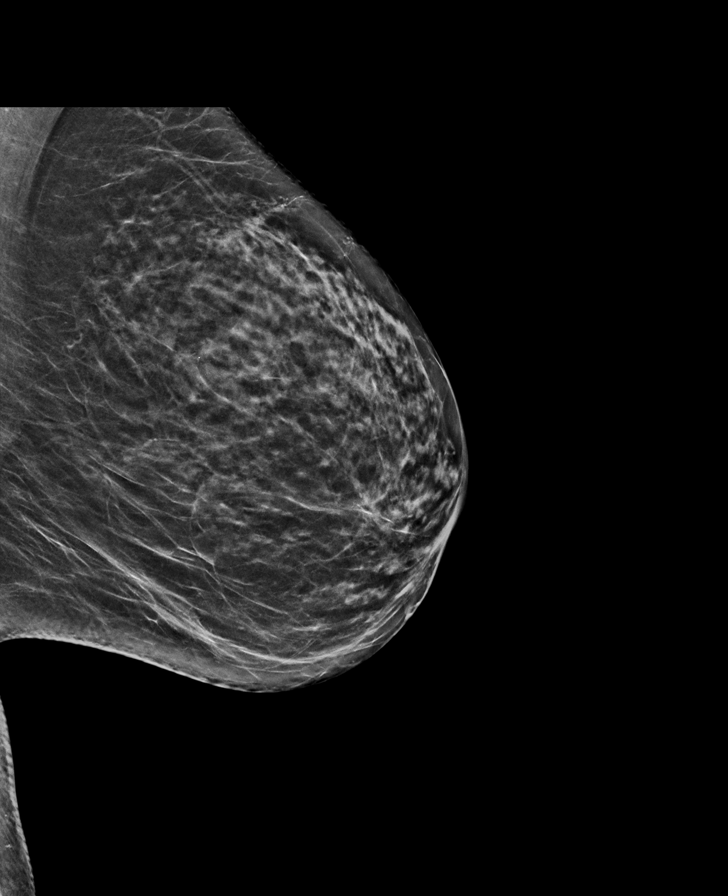

[R MLO synth-2D]
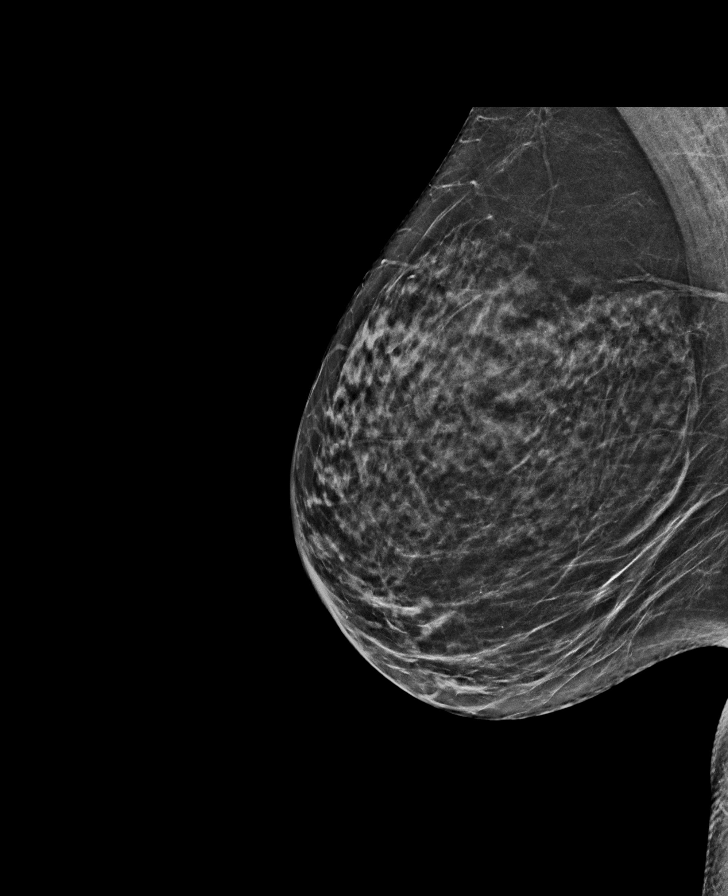

[L CC synth-2D]
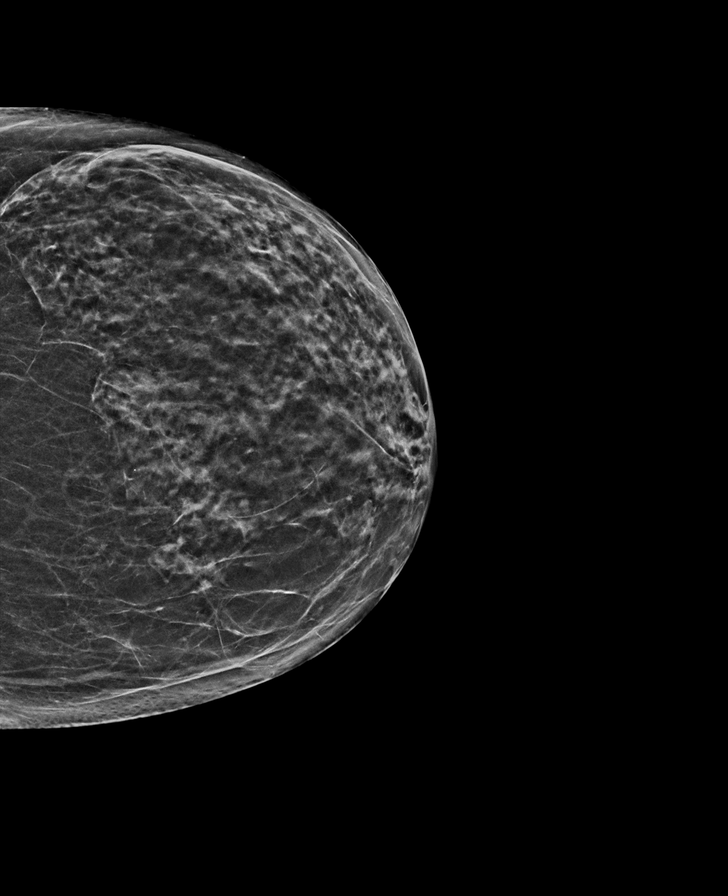

[R MLO tomo · tomo slice 28/55.0]
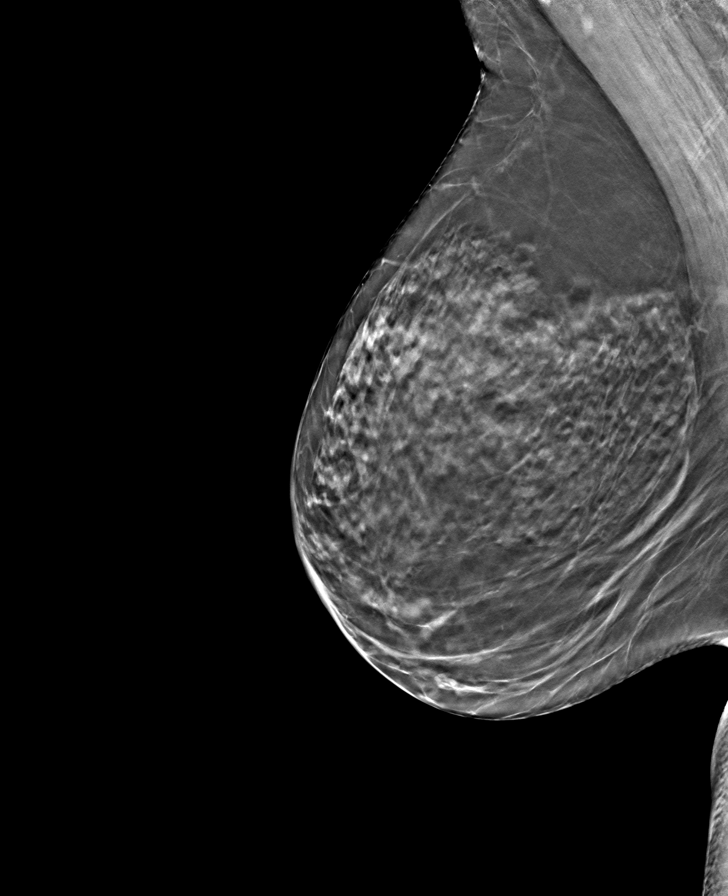

[L MLO tomo · tomo slice 29/56.0]
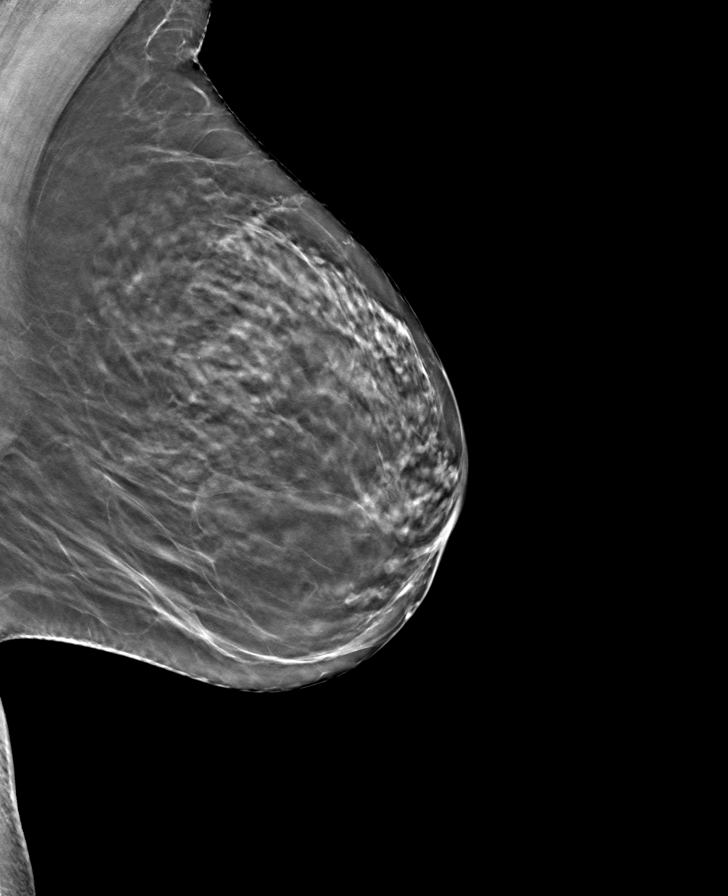

[R CC tomo · tomo slice 27/54.0]
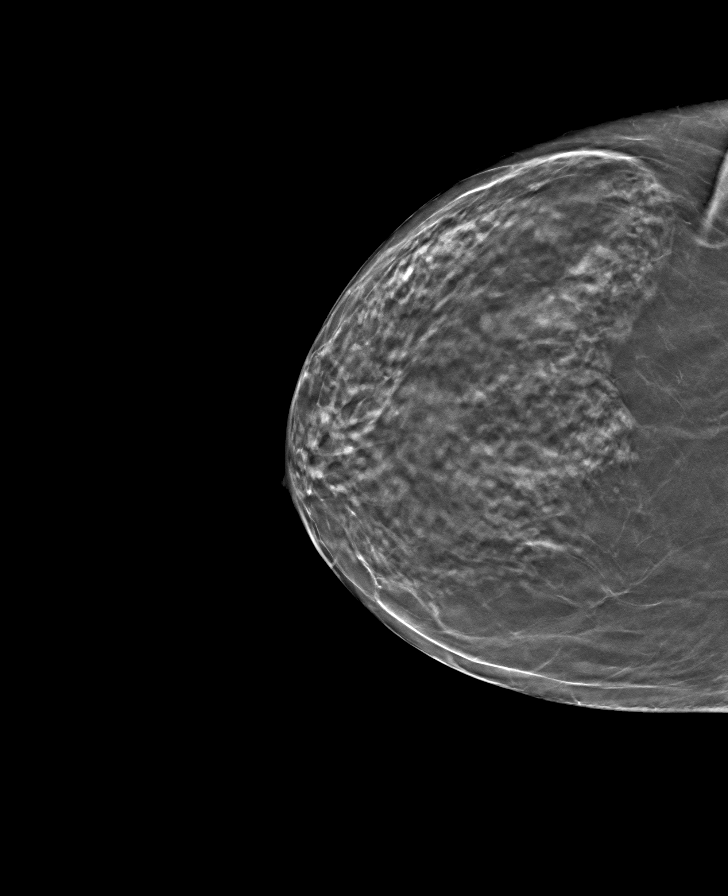

[L CC tomo · tomo slice 27/53.0]
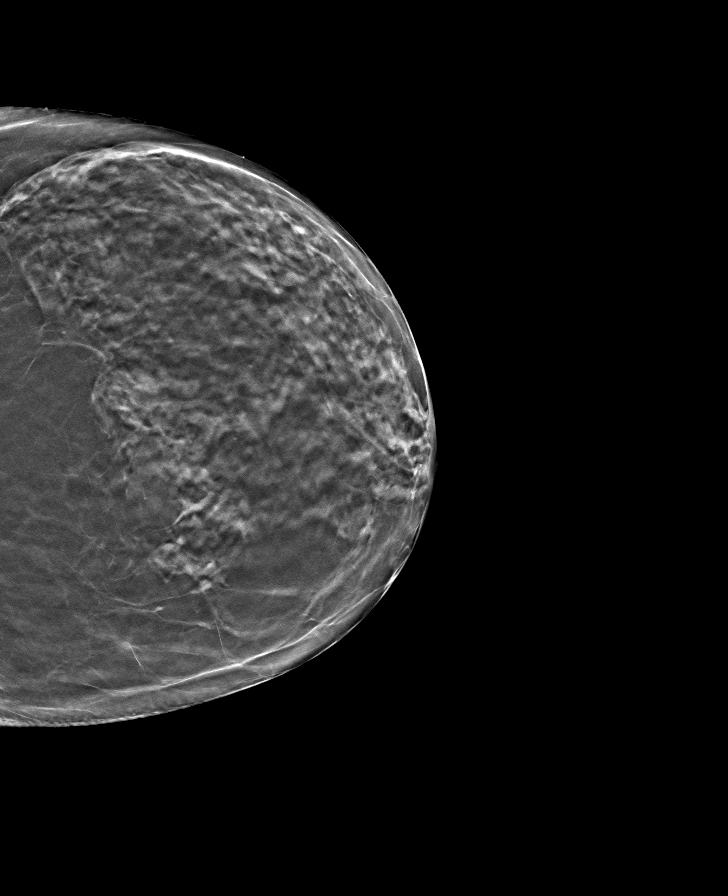

[8 of 24 positions shown; findings below may reference images not displayed]

ACR Breast Density Category c: The breast tissue is heterogeneously
dense, which may obscure small masses.
FINDINGS: There are no findings suspicious for malignancy. Images were
processed with CAD.
IMPRESSION: No mammographic evidence of malignancy. A result letter of this
screening mammogram will be mailed directly to the patient.

RECOMMENDATION:
Screening mammogram in one year. (Code:[5V])

BI-RADS CATEGORY  1: Negative.

## 2019-05-25 ENCOUNTER — Other Ambulatory Visit (HOSPITAL_COMMUNITY): Payer: Self-pay | Admitting: *Deleted

## 2019-05-25 ENCOUNTER — Inpatient Hospital Stay (HOSPITAL_COMMUNITY): Payer: BC Managed Care – PPO | Admitting: Nurse Practitioner

## 2019-05-25 ENCOUNTER — Encounter (HOSPITAL_COMMUNITY): Payer: Self-pay | Admitting: Nurse Practitioner

## 2019-05-25 ENCOUNTER — Other Ambulatory Visit: Payer: Self-pay

## 2019-05-25 DIAGNOSIS — D45 Polycythemia vera: Secondary | ICD-10-CM

## 2019-05-25 DIAGNOSIS — R51 Headache: Secondary | ICD-10-CM

## 2019-05-25 DIAGNOSIS — R61 Generalized hyperhidrosis: Secondary | ICD-10-CM

## 2019-05-25 DIAGNOSIS — R5383 Other fatigue: Secondary | ICD-10-CM

## 2019-05-25 DIAGNOSIS — Z720 Tobacco use: Secondary | ICD-10-CM

## 2019-05-25 MED ORDER — TRAMADOL HCL 50 MG PO TABS: 25.0000 mg | ORAL_TABLET | Freq: Two times a day (BID) | ORAL | 0 refills | Status: DC | PRN

## 2019-05-25 NOTE — Assessment & Plan Note (Addendum)
1.  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 -Bone marrow biopsy was done on 07/12/2005 showed hypercellular marrow consistent with polycythemia. -She is a 30 pack-year active current smoker.  Does not have a history of thrombosis. -She reports her hot flashes have increased over the past 3 weeks which happens when she needs a phlebotomy.  She denies any itching after hot showers. -She has had a phlebotomy every 4 months since diagnosis.  Her symptoms improve after phlebotomies. -She continues to take aspirin 81 mg daily. -Labs on 05/21/2019 showed her hemoglobin is 14.4 and her hematocrit is 43.8. -Patient is requesting she go ahead and get 1 phlebotomy due to her increased night sweats and fatigue.  She is also has occasional headaches. - She will follow-up in the clinic in 4 months with repeat labs.

## 2019-05-25 NOTE — Progress Notes (Signed)
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 female returns for routine follow-up for polycythemia.  She reports she is having more night sweats.  She is also more fatigued during the day.  She is having headaches more frequently and feels that she needs a phlebotomy.  This improves all of her symptoms. Denies any nausea, vomiting, or diarrhea. Denies any new pains. Had not noticed any recent bleeding such as epistaxis, hematuria or hematochezia. Denies recent chest pain on exertion, shortness of breath on minimal exertion, pre-syncopal episodes, or palpitations. Denies any numbness or tingling in hands or feet. Denies any recent fevers, infections, or recent hospitalizations. Patient reports appetite at 25% and energy level at 50%. She is eating well maintaining her weight at this time.    REVIEW OF SYSTEMS:  Review of Systems  Constitutional: Positive for fatigue.  Gastrointestinal: Positive for diarrhea.  Endocrine: Positive for hot flashes.  Neurological: Positive for headaches and numbness.  All other systems reviewed and are negative.    PAST MEDICAL/SURGICAL HISTORY:  Past Medical History:  Diagnosis Date  . Abdominal pain, right upper quadrant 06/10/2018  . Anxiety   . Arthritis   . Bursitis   . DDD (degenerative disc disease)   . Depression   . Fibromyalgia   . Obsessive-compulsive disorder   . Overactive bladder   . 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 PTSD (post-traumatic stress disorder)   . PVC's (premature ventricular contractions)    pt. placed on heart monitor x 24hours, everything fine   . Sesamoiditis February 2017   Past Surgical History:  Procedure  Laterality Date  . ABDOMINAL HYSTERECTOMY    . ESOPHAGOGASTRODUODENOSCOPY N/A 09/10/2014   Procedure: ESOPHAGOGASTRODUODENOSCOPY (EGD);  Surgeon: Rogene Houston, MD;  [facility];  Service: Endoscopy;  Laterality: N/A;  130 - moved to 3:15 - Ann to notify  . LUMBAR FUSION    . TOOTH EXTRACTION Left Aug 2016     SOCIAL HISTORY:  Social History   Socioeconomic History  . Marital status: Married    Spouse name: Not on file  . Number of children: Not on file  . Years of education: Not on file  . Highest education level: Not on file  Occupational History  . Not on file  Social Needs  . Financial resource strain: Not on file  . Food insecurity    Worry: Not on file    Inability: Not on file  . Transportation needs    Medical: Not on file    Non-medical: Not on file  Tobacco Use  . Smoking status: Current Every Day Smoker    Packs/day: 1.00    Years: 15.00    Pack years: 15.00    Types: Cigarettes    Start date: 11/12/1996  . Smokeless tobacco: Never Used  . Tobacco comment: 1 pack a day x 20 yrs.  Substance and Sexual Activity  . Alcohol use: No    Alcohol/week: 0.0 standard drinks  . Drug use: No    Types: Hydrocodone, Benzodiazepines  . Sexual activity: Not on file  Lifestyle  . Physical activity    Days per week: Not on file    Minutes per session: Not on file  .  Stress: Not on file  Relationships  . Social Herbalist on phone: Not on file    Gets together: Not on file    Attends religious service: Not on file    Active member of club or organization: Not on file    Attends meetings of clubs or organizations: Not on file    Relationship status: Not on file  . Intimate partner violence    Fear of current or ex partner: Not on file    Emotionally abused: Not on file    Physically abused: Not on file    Forced sexual activity: Not on file  Other Topics Concern  . Not on file  Social History Narrative  . Not on file    FAMILY HISTORY:   Family History  Problem Relation Age of Onset  . Cancer - Colon Mother        age 23  . Anxiety disorder Mother   . Atrial fibrillation Father   . Atrial fibrillation Brother   . Bipolar disorder Neg Hx   . Dementia Neg Hx   . Drug abuse Neg Hx   . Paranoid behavior Neg Hx   . Schizophrenia Neg Hx   . Seizures Neg Hx   . Sexual abuse Neg Hx   . Physical abuse Neg Hx     CURRENT MEDICATIONS:  Outpatient Encounter Medications as of 05/25/2019  Medication Sig Note  . doxycycline (DORYX) 100 MG EC tablet Take 100 mg by mouth 2 (two) times daily.   Marland Kitchen aspirin 81 MG tablet Take 1 tablet (81 mg total) by mouth daily. For Polycythemia Vera.   . sertraline (ZOLOFT) 100 MG tablet Take 1.5 tablets (150 mg total) by mouth daily.   . traZODone (DESYREL) 50 MG tablet TAKE 1 TABLET(50 MG) BY MOUTH AT BEDTIME   . [DISCONTINUED] cyclobenzaprine (FLEXERIL) 10 MG tablet Take 1 tablet (10 mg total) by mouth 3 (three) times daily. (Patient not taking: Reported on 05/25/2019)   . [DISCONTINUED] ketorolac (ACULAR) 0.5 % ophthalmic solution Place 1 drop into both eyes daily as needed. 03/11/2015: Received from: External Pharmacy Received Sig:   . [DISCONTINUED] loratadine-pseudoephedrine (CLARITIN-D 12 HOUR) 5-120 MG tablet Take 1 tablet by mouth 2 (two) times daily. (Patient taking differently: Take 1 tablet by mouth 2 (two) times daily as needed. )   . [DISCONTINUED] methocarbamol (ROBAXIN) 500 MG tablet TK 1 T PO Q 8 H FOR 10 DAYS PRN   . [DISCONTINUED] neomycin-polymyxin-hydrocortisone (CORTISPORIN) OTIC solution Apply 1-2 drops to toe after soaking twice a day   . [DISCONTINUED] predniSONE (STERAPRED UNI-PAK 21 TAB) 5 MG (21) TBPK tablet See admin instructions. see package   . [DISCONTINUED] traMADol (ULTRAM) 50 MG tablet 1 or 2 po q6h prn pain (Patient not taking: Reported on 05/25/2019)    No facility-administered encounter medications on file as of 05/25/2019.     ALLERGIES:  Allergies  Allergen  Reactions  . Gabapentin Hives  . Hydrocodone-Acetaminophen Nausea Only  . Penicillins Other (See Comments)    Unknown child hood reaction     PHYSICAL EXAM:  ECOG Performance status: 1  Vitals:   05/25/19 1307  BP: 107/81  Pulse: 81  Resp: 16  Temp: (!) 97.5 F (36.4 C)  SpO2: 100%   Filed Weights   05/25/19 1307  Weight: 135 lb 14.4 oz (61.6 kg)    Physical Exam Constitutional:      Appearance: Normal appearance. She is normal weight.  Cardiovascular:  Rate and Rhythm: Normal rate and regular rhythm.     Heart sounds: Normal heart sounds.  Pulmonary:     Effort: Pulmonary effort is normal.     Breath sounds: Normal breath sounds.  Abdominal:     General: Bowel sounds are normal.     Palpations: Abdomen is soft.  Musculoskeletal: Normal range of motion.  Skin:    General: Skin is warm and dry.  Neurological:     Mental Status: She is alert and oriented to person, place, and time. Mental status is at baseline.  Psychiatric:        Mood and Affect: Mood normal.        Behavior: Behavior normal.        Thought Content: Thought content normal.        Judgment: Judgment normal.      LABORATORY DATA:  I have reviewed the labs as listed.  CBC    Component Value Date/Time   WBC 5.8 05/21/2019 1032   RBC 4.65 05/21/2019 1032   HGB 14.4 05/21/2019 1032   HGB 12.9 06/04/2018 0920   HGB 12.8 06/05/2013 1327   HCT 43.8 05/21/2019 1032   HCT 40.2 06/04/2018 0920   HCT 38.6 06/05/2013 1327   PLT 276 05/21/2019 1032   PLT 317 06/04/2018 0920   MCV 94.2 05/21/2019 1032   MCV 93 06/04/2018 0920   MCV 89.6 06/05/2013 1327   MCH 31.0 05/21/2019 1032   MCHC 32.9 05/21/2019 1032   RDW 13.6 05/21/2019 1032   RDW 14.4 06/04/2018 0920   RDW 14.4 06/05/2013 1327   LYMPHSABS 1.7 05/21/2019 1032   LYMPHSABS 1.8 06/04/2018 0920   LYMPHSABS 2.3 06/05/2013 1327   MONOABS 0.5 05/21/2019 1032   MONOABS 0.6 06/05/2013 1327   EOSABS 0.1 05/21/2019 1032   EOSABS 0.2  06/04/2018 0920   BASOSABS 0.0 05/21/2019 1032   BASOSABS 0.0 06/04/2018 0920   BASOSABS 0.1 06/05/2013 1327   CMP Latest Ref Rng & Units 05/21/2019 03/09/2019 02/09/2019  Glucose 70 - 99 mg/dL 83 94 102(H)  BUN 6 - 20 mg/dL 5(L) 5(L) 5(L)  Creatinine 0.44 - 1.00 mg/dL 0.68 0.71 0.69  Sodium 135 - 145 mmol/L 141 138 134(L)  Potassium 3.5 - 5.1 mmol/L 3.8 3.7 3.9  Chloride 98 - 111 mmol/L 103 104 101  CO2 22 - 32 mmol/L '27 27 26  ' Calcium 8.9 - 10.3 mg/dL 9.4 8.7(L) 8.6(L)  Total Protein 6.5 - 8.1 g/dL 7.2 6.7 6.7  Total Bilirubin 0.3 - 1.2 mg/dL 0.5 0.4 0.3  Alkaline Phos 38 - 126 U/L 67 62 64  AST 15 - 41 U/L 12(L) 12(L) 14(L)  ALT 0 - 44 U/L '11 10 10     ' I personally performed a face-to-face visit.  All questions were answered to patient's stated satisfaction. Encouraged patient to call with any new concerns or questions before his next visit to the cancer center and we can certain see him sooner, if needed.     ASSESSMENT & PLAN:   Polycythemia vera 1.  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 -Bone marrow biopsy was done on 07/12/2005 showed hypercellular marrow consistent with polycythemia. -She is a 30 pack-year active current smoker.  Does not have a history of thrombosis. -She reports her hot flashes have increased over the past 3 weeks which happens when she needs a phlebotomy.  She denies any itching after hot showers. -She has had a phlebotomy  every 4 months since diagnosis.  Her symptoms improve after phlebotomies. -She continues to take aspirin 81 mg daily. -Labs on 05/21/2019 showed her hemoglobin is 14.4 and her hematocrit is 43.8. -Patient is requesting she go ahead and get 1 phlebotomy due to her increased night sweats and fatigue.  She is also has occasional headaches. - She will follow-up in the clinic in 4 months with repeat labs.      Orders placed this encounter:  Orders Placed This Encounter  Procedures  . Lactate  dehydrogenase  . CBC with Differential/Platelet  . Comprehensive metabolic panel  . Ferritin      [name], FNP-C [place] [phone]

## 2019-05-25 NOTE — Telephone Encounter (Signed)
Orders placed per [name], NP

## 2019-05-25 NOTE — Patient Instructions (Addendum)
[name] at [facility]  Discharge Instructions: follow up in 4 months with labs   You saw [name] today. _______________________________________________________________  Thank you for choosing [place] at [facility] to provide your oncology and hematology care.  To afford each patient quality time with our providers, please arrive at least 15 minutes before your scheduled appointment.  You need to re-schedule your appointment if you arrive 10 or more minutes late.  We strive to give you quality time with our providers, and arriving late affects you and other patients whose appointments are after yours.  Also, if you no show three or more times for appointments you may be dismissed from the clinic.  Again, thank you for choosing [place] [facility] hope is that these requests will allow you access to exceptional care and in a timely manner. _______________________________________________________________  If you have questions after your visit, please contact our office at [phone] between the hours of 8:30 a.m. and 5:00 p.m. Voicemails left after 4:30 p.m. will not be returned until the following business day. _______________________________________________________________  For prescription refill requests, have your pharmacy contact our office. _______________________________________________________________  Recommendations made by the consultant and any test results will be sent to your referring physician. _______________________________________________________________

## 2019-05-28 ENCOUNTER — Other Ambulatory Visit (HOSPITAL_COMMUNITY): Payer: Self-pay | Admitting: *Deleted

## 2019-05-28 ENCOUNTER — Other Ambulatory Visit: Payer: Self-pay

## 2019-05-28 ENCOUNTER — Inpatient Hospital Stay (HOSPITAL_COMMUNITY): Payer: BC Managed Care – PPO

## 2019-05-28 DIAGNOSIS — D45 Polycythemia vera: Secondary | ICD-10-CM | POA: Diagnosis not present

## 2019-05-28 MED ORDER — TRAMADOL HCL 50 MG PO TABS: 25.0000 mg | ORAL_TABLET | Freq: Two times a day (BID) | ORAL | 0 refills | Status: DC | PRN

## 2019-05-28 NOTE — Progress Notes (Signed)
Vita [facility]ley [facility] presents today for phlebotomy per MD orders. Phlebotomy procedure started at 1405 and ended at 1214 500 cc removed. Patient tolerated procedure well. IV needle removed intact.  Vitals stable and discharged home from clinic ambulatory. Follow up as scheduled.

## 2019-06-11 ENCOUNTER — Other Ambulatory Visit (HOSPITAL_COMMUNITY): Payer: Self-pay

## 2019-06-16 ENCOUNTER — Encounter (HOSPITAL_COMMUNITY): Payer: Self-pay | Admitting: Psychiatry

## 2019-06-16 ENCOUNTER — Other Ambulatory Visit: Payer: Self-pay

## 2019-06-16 ENCOUNTER — Ambulatory Visit (INDEPENDENT_AMBULATORY_CARE_PROVIDER_SITE_OTHER): Payer: BC Managed Care – PPO | Admitting: Psychiatry

## 2019-06-16 DIAGNOSIS — F418 Other specified anxiety disorders: Secondary | ICD-10-CM

## 2019-06-16 DIAGNOSIS — F321 Major depressive disorder, single episode, moderate: Secondary | ICD-10-CM

## 2019-06-16 DIAGNOSIS — F429 Obsessive-compulsive disorder, unspecified: Secondary | ICD-10-CM

## 2019-06-16 DIAGNOSIS — F5105 Insomnia due to other mental disorder: Secondary | ICD-10-CM | POA: Diagnosis not present

## 2019-06-16 MED ORDER — SERTRALINE HCL 100 MG PO TABS: 150.0000 mg | ORAL_TABLET | Freq: Every day | ORAL | 3 refills | Status: DC

## 2019-06-16 MED ORDER — TRAZODONE HCL 50 MG PO TABS: ORAL_TABLET | ORAL | 3 refills | Status: DC

## 2019-06-16 NOTE — Progress Notes (Signed)
Virtual Visit via Video Note  I connected with Cheryl Cross on 06/16/19 at  4:00 PM EDT by a video enabled telemedicine application and verified that I am speaking with the correct person using two identifiers.   I discussed the limitations of evaluation and management by telemedicine and the availability of in person appointments. The patient expressed understanding and agreed to proceed.    I discussed the assessment and treatment plan with the patient. The patient was provided an opportunity to ask questions and all were answered. The patient agreed with the plan and demonstrated an understanding of the instructions.   The patient was advised to call back or seek an in-person evaluation if the symptoms worsen or if the condition fails to improve as anticipated.  I provided 15 minutes of non-face-to-face time during this encounter.   Cheryl Spiller, MD  [facility] MD/PA/NP OP Progress Note  06/16/2019 4:11 PM Cheryl Cross  MRN:  [id number]  Chief Complaint:  Chief Complaint    Depression; Follow-up     HPI: This patient is a53 year old separated white female who lives with her parents in [place]. 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 She had back pain and fibromyalgia. She got hooked on taking too many Xanax and was sleeping all the time and unable to function. She was [facility] and got off the Xanax and since then has been on trazodone and Zoloft. 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 She sees Dr. Jefm Miles here which she finds very helpful. She denies being depressed and stays on a good regimen of eating and sleeping. Her mood is generally been good and she's not significantly anxious. She's not abusing any substances and she denies suicidal ideation  The patient returns for follow-up after 4 months.  For  the most part she is doing okay.  She had some lymphadenopathy in her neck and fatigue.  She was on antibiotics for few days and now is feeling better.  She thinks school go better this year because it is been more planning for virtual learning.  Overall she still living with her parents and helping care for them and this is going well.  She denies any symptoms of depression anxiety or difficulty sleeping.  She is not using drugs or alcohol. Visit Diagnosis:    ICD-10-CM   1. Major depressive disorder, single episode, moderate (HCC)  F32.1 sertraline (ZOLOFT) 100 MG tablet  2. Obsessive-compulsive disorder, unspecified type  F42.9 sertraline (ZOLOFT) 100 MG tablet  3. Insomnia secondary to depression with anxiety  F51.05 traZODone (DESYREL) 50 MG tablet   F41.8     Past Psychiatric History: Psychiatric hospitalization for benzodiazepine abuse in 2013  Past Medical History:  Past Medical History:  Diagnosis Date  . Abdominal pain, right upper quadrant 06/10/2018  . Anxiety   . Arthritis   . Bursitis   . DDD (degenerative disc disease)   . Depression   . Fibromyalgia   . Obsessive-compulsive disorder   . Overactive bladder   . 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 PTSD (post-traumatic stress disorder)   . PVC's (premature ventricular contractions)    pt. placed on heart monitor x 24hours, everything fine   . Sesamoiditis February 2017    Past Surgical History:  Procedure Laterality Date  . ABDOMINAL HYSTERECTOMY    . ESOPHAGOGASTRODUODENOSCOPY N/A 09/10/2014   Procedure: ESOPHAGOGASTRODUODENOSCOPY (  EGD);  Surgeon: Rogene Houston, MD;  [facility];  Service: Endoscopy;  Laterality: N/A;  130 - moved to 3:15 - Ann to notify  . LUMBAR FUSION    . TOOTH EXTRACTION Left Aug 2016    Family Psychiatric History: See below  Family History:  Family History  Problem Relation Age of Onset  . Cancer - Colon Mother        age 60  . Anxiety disorder Mother   . Atrial  fibrillation Father   . Atrial fibrillation Brother   . Bipolar disorder Neg Hx   . Dementia Neg Hx   . Drug abuse Neg Hx   . Paranoid behavior Neg Hx   . Schizophrenia Neg Hx   . Seizures Neg Hx   . Sexual abuse Neg Hx   . Physical abuse Neg Hx     Social History:  Social History   Socioeconomic History  . Marital status: Married    Spouse name: Not on file  . Number of children: Not on file  . Years of education: Not on file  . Highest education level: Not on file  Occupational History  . Not on file  Social Needs  . Financial resource strain: Not on file  . Food insecurity    Worry: Not on file    Inability: Not on file  . Transportation needs    Medical: Not on file    Non-medical: Not on file  Tobacco Use  . Smoking status: Current Every Day Smoker    Packs/day: 1.00    Years: 15.00    Pack years: 15.00    Types: Cigarettes    Start date: 11/12/1996  . Smokeless tobacco: Never Used  . Tobacco comment: 1 pack a day x 20 yrs.  Substance and Sexual Activity  . Alcohol use: No    Alcohol/week: 0.0 standard drinks  . Drug use: No    Types: Hydrocodone, Benzodiazepines  . Sexual activity: Not on file  Lifestyle  . Physical activity    Days per week: Not on file    Minutes per session: Not on file  . Stress: Not on file  Relationships  . Social Herbalist on phone: Not on file    Gets together: Not on file    Attends religious service: Not on file    Active member of club or organization: Not on file    Attends meetings of clubs or organizations: Not on file    Relationship status: Not on file  Other Topics Concern  . Not on file  Social History Narrative  . Not on file    Allergies:  Allergies  Allergen Reactions  . Gabapentin Hives  . Hydrocodone-Acetaminophen Nausea Only  . Penicillins Other (See Comments)    Unknown child hood reaction    Metabolic Disorder Labs: No results found for: HGBA1C, MPG No results found for: PROLACTIN No  results found for: CHOL, TRIG, HDL, CHOLHDL, VLDL, LDLCALC Lab Results  Component Value Date   TSH 1.490 05/28/2016   TSH 0.896 03/11/2012    Therapeutic Level Labs: No results found for: LITHIUM No results found for: VALPROATE No components found for:  CBMZ  Current Medications: Current Outpatient Medications  Medication Sig Dispense Refill  . aspirin 81 MG tablet Take 1 tablet (81 mg total) by mouth daily. For Polycythemia Vera.    . sertraline (ZOLOFT) 100 MG tablet Take 1.5 tablets (150 mg total) by mouth daily. 45 tablet  3  . traMADol (ULTRAM) 50 MG tablet Take 0.5 tablets (25 mg total) by mouth every 12 (twelve) hours as needed for moderate pain. 30 tablet 0  . traZODone (DESYREL) 50 MG tablet TAKE 1 TABLET(50 MG) BY MOUTH AT BEDTIME 30 tablet 3   No current facility-administered medications for this visit.      Musculoskeletal: Strength & Muscle Tone: within normal limits Gait & Station: normal Patient leans: N/A  Psychiatric Specialty Exam: Review of Systems  Musculoskeletal: Positive for neck pain.  All other systems reviewed and are negative.   There were no vitals taken for this visit.There is no height or weight on file to calculate BMI.  General Appearance: Casual, Neat and Well Groomed  Eye Contact:  Good  Speech:  Clear and Coherent  Volume:  Normal  Mood:  Euthymic  Affect:  Appropriate and Congruent  Thought Process:  Goal Directed  Orientation:  Full (Time, Place, and Person)  Thought Content: WDL   Suicidal Thoughts:  No  Homicidal Thoughts:  No  Memory:  Immediate;   Good Recent;   Good Remote;   Good  Judgement:  Good  Insight:  Good  Psychomotor Activity:  Normal  Concentration:  Concentration: Good and Attention Span: Good  Recall:  Good  Fund of Knowledge: Good  Language: Good  Akathisia:  No  Handed:  Right  AIMS (if indicated): not done  Assets:  Communication Skills Desire for Improvement Physical Health Resilience Social  Support Talents/Skills Vocational/Educational  ADL's:  Intact  Cognition: WNL  Sleep:  Good   Screenings: PHQ2-9     Office Visit from 06/10/2018 in [place] Office Visit from 06/04/2017 in [place] Office Visit from 05/28/2016 in [place] Office Visit from 07/04/2015 in [facility] Office Visit from 06/07/2014 in [facility]  PHQ-2 Total Score  1  1  0  0  0       Assessment and Plan: This patient is a 53 year old female with a history of substance abuse now in remission depression and difficulty sleeping.  She is doing well on her current regimen.  She will continue Zoloft 150 mg daily for depression and trazodone 50 mg at bedtime for sleep.  She will return to see me in 4 months   Cheryl Spiller, MD 06/16/2019, 4:11 PM

## 2019-06-18 ENCOUNTER — Ambulatory Visit (HOSPITAL_COMMUNITY): Payer: BC Managed Care – PPO | Admitting: Nurse Practitioner

## 2019-09-15 ENCOUNTER — Other Ambulatory Visit: Payer: Self-pay

## 2019-09-15 ENCOUNTER — Inpatient Hospital Stay (HOSPITAL_COMMUNITY): Payer: BC Managed Care – PPO | Attending: Hematology

## 2019-09-15 DIAGNOSIS — D45 Polycythemia vera: Secondary | ICD-10-CM | POA: Diagnosis not present

## 2019-09-15 DIAGNOSIS — F17210 Nicotine dependence, cigarettes, uncomplicated: Secondary | ICD-10-CM | POA: Diagnosis not present

## 2019-09-15 DIAGNOSIS — R519 Headache, unspecified: Secondary | ICD-10-CM | POA: Diagnosis not present

## 2019-09-15 DIAGNOSIS — R5383 Other fatigue: Secondary | ICD-10-CM | POA: Diagnosis not present

## 2019-09-15 DIAGNOSIS — N951 Menopausal and female climacteric states: Secondary | ICD-10-CM | POA: Insufficient documentation

## 2019-09-15 LAB — COMPREHENSIVE METABOLIC PANEL
ALT: 15 U/L (ref 0–44)
AST: 15 U/L (ref 15–41)
Albumin: 4 g/dL (ref 3.5–5.0)
Alkaline Phosphatase: 71 U/L (ref 38–126)
Anion gap: 7 (ref 5–15)
BUN: <5 mg/dL — ABNORMAL LOW (ref 6–20)
CO2: 25 mmol/L (ref 22–32)
Calcium: 9 mg/dL (ref 8.9–10.3)
Chloride: 106 mmol/L (ref 98–111)
Creatinine, Ser: 0.86 mg/dL (ref 0.44–1.00)
GFR calc Af Amer: >60 mL/min (ref >60)
GFR calc non Af Amer: >60 mL/min (ref >60)
Glucose, Bld: 105 mg/dL — ABNORMAL HIGH (ref 70–99)
Potassium: 3.4 mmol/L — ABNORMAL LOW (ref 3.5–5.1)
Sodium: 138 mmol/L (ref 135–145)
Total Bilirubin: 0.3 mg/dL (ref 0.3–1.2)
Total Protein: 6.6 g/dL (ref 6.5–8.1)

## 2019-09-15 LAB — CBC WITH DIFFERENTIAL/PLATELET
Abs Immature Granulocytes: 0.02 10*3/uL (ref 0.00–0.07)
Basophils Absolute: 0 10*3/uL (ref 0.0–0.1)
Basophils Relative: 0 %
Eosinophils Absolute: 0.1 10*3/uL (ref 0.0–0.5)
Eosinophils Relative: 1 %
HCT: 40.4 % (ref 36.0–46.0)
Hemoglobin: 13.1 g/dL (ref 12.0–15.0)
Immature Granulocytes: 0 %
Lymphocytes Relative: 22 %
Lymphs Abs: 1.9 10*3/uL (ref 0.7–4.0)
MCH: 30.8 pg (ref 26.0–34.0)
MCHC: 32.4 g/dL (ref 30.0–36.0)
MCV: 95.1 fL (ref 80.0–100.0)
Monocytes Absolute: 0.5 10*3/uL (ref 0.1–1.0)
Monocytes Relative: 6 %
Neutro Abs: 6.4 10*3/uL (ref 1.7–7.7)
Neutrophils Relative %: 71 %
Platelets: 282 10*3/uL (ref 150–400)
RBC: 4.25 MIL/uL (ref 3.87–5.11)
RDW: 15.5 % (ref 11.5–15.5)
WBC: 9 10*3/uL (ref 4.0–10.5)
nRBC: 0 % (ref 0.0–0.2)

## 2019-09-15 LAB — FERRITIN: Ferritin: 6 ng/mL — ABNORMAL LOW (ref 11–307)

## 2019-09-15 LAB — LACTATE DEHYDROGENASE: LDH: 129 U/L (ref 98–192)

## 2019-09-22 ENCOUNTER — Other Ambulatory Visit (HOSPITAL_COMMUNITY): Payer: BC Managed Care – PPO

## 2019-09-23 ENCOUNTER — Ambulatory Visit (HOSPITAL_COMMUNITY): Payer: BC Managed Care – PPO | Admitting: Hematology

## 2019-09-24 ENCOUNTER — Ambulatory Visit (HOSPITAL_COMMUNITY): Payer: BC Managed Care – PPO | Admitting: Hematology

## 2019-09-25 ENCOUNTER — Inpatient Hospital Stay (HOSPITAL_COMMUNITY): Payer: BC Managed Care – PPO | Admitting: Hematology

## 2019-09-25 ENCOUNTER — Other Ambulatory Visit: Payer: Self-pay

## 2019-09-25 ENCOUNTER — Encounter (HOSPITAL_COMMUNITY): Payer: Self-pay | Admitting: Hematology

## 2019-09-25 DIAGNOSIS — D45 Polycythemia vera: Secondary | ICD-10-CM | POA: Diagnosis not present

## 2019-09-25 NOTE — Assessment & Plan Note (Signed)
1.  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 -Bone marrow biopsy was done on 07/12/2005 showed hypercellular marrow consistent with polycythemia. -She is a 30 pack-year active current smoker.  Does not have a history of thrombosis. -She reports her hot flashes have increased over the past 3 weeks which happens when she needs a phlebotomy.  She denies any itching after hot showers. -She has had a phlebotomy every 4 months since diagnosis.  Her symptoms improve after phlebotomies. -She continues to take aspirin 81 mg daily. -Labs on 05/21/2019 showed her hemoglobin is 14.4 and her hematocrit is 43.8. -Patient is requested  she go ahead and get 1 phlebotomy due to her increased night sweats and fatigue.  She is also has occasional headaches. -Patient overall feels well.  No fever chills night sweats.  No pruritus.  No flushing of the skin.  Hemoglobin is stable at 13.1, hematocrit within normal at 40.4.  No indication for phlebotomy at this time.  Patient will return to clinic in 3 months.

## 2019-09-25 NOTE — Progress Notes (Signed)
[facility], [place] [address]   CLINIC:  Medical Oncology/Hematology  PCP:  [facility], [place] [address] [phone]   REASON FOR VISIT:  Follow-up for polycythemia vera  CURRENT THERAPY: Clinical surveillance    INTERVAL HISTORY:  Cheryl Cross 53 y.o. female presents today for follow-up.  Reports overall doing well.  She denies any significant fatigue.  She denies any fevers, chills, night sweats.  She denies any pruritus.  She denies any flushing of skin.  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  She often receives phlebotomies for hemoglobin greater than 12.  She is here today for repeat labs and follow-up.   REVIEW OF SYSTEMS:  Review of Systems  Constitutional: Positive for fatigue.  HENT:  Negative.   Eyes: Negative.   Respiratory: Negative.   Cardiovascular: Negative.   Gastrointestinal: Negative.   Endocrine: Negative.   Genitourinary: Negative.    Musculoskeletal: Negative.   Skin: Negative.   Neurological: Negative.   Hematological: Negative.   Psychiatric/Behavioral: Negative.      PAST MEDICAL/SURGICAL HISTORY:  Past Medical History:  Diagnosis Date  . Abdominal pain, right upper quadrant 06/10/2018  . Anxiety   . Arthritis   . Bursitis   . DDD (degenerative disc disease)   . Depression   . Fibromyalgia   . Obsessive-compulsive disorder   . Overactive bladder   . 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 PTSD (post-traumatic stress disorder)   . PVC's (premature ventricular contractions)    pt. placed on heart monitor x 24hours, everything fine   . Sesamoiditis February 2017   Past Surgical History:  Procedure Laterality Date  . ABDOMINAL HYSTERECTOMY    . ESOPHAGOGASTRODUODENOSCOPY N/A 09/10/2014   Procedure: ESOPHAGOGASTRODUODENOSCOPY (EGD);  Surgeon: Rogene Houston, MD;  [facility];  Service: Endoscopy;  Laterality: N/A;  130  - moved to 3:15 - Ann to notify  . LUMBAR FUSION    . TOOTH EXTRACTION Left Aug 2016     SOCIAL HISTORY:  Social History   Socioeconomic History  . Marital status: Married    Spouse name: Not on file  . Number of children: Not on file  . Years of education: Not on file  . Highest education level: Not on file  Occupational History  . Not on file  Social Needs  . Financial resource strain: Not on file  . Food insecurity    Worry: Not on file    Inability: Not on file  . Transportation needs    Medical: Not on file    Non-medical: Not on file  Tobacco Use  . Smoking status: Current Every Day Smoker    Packs/day: 1.00    Years: 15.00    Pack years: 15.00    Types: Cigarettes    Start date: 11/12/1996  . Smokeless tobacco: Never Used  . Tobacco comment: 1 pack a day x 20 yrs.  Substance and Sexual Activity  . Alcohol use: No    Alcohol/week: 0.0 standard drinks  . Drug use: No    Types: Hydrocodone, Benzodiazepines  . Sexual activity: Not on file  Lifestyle  . Physical activity    Days per week: Not on file    Minutes per session: Not on file  . Stress: Not on file  Relationships  . Social Herbalist on phone: Not on file    Gets together: Not on file  Attends religious service: Not on file    Active member of club or organization: Not on file    Attends meetings of clubs or organizations: Not on file    Relationship status: Not on file  . Intimate partner violence    Fear of current or ex partner: Not on file    Emotionally abused: Not on file    Physically abused: Not on file    Forced sexual activity: Not on file  Other Topics Concern  . Not on file  Social History Narrative  . Not on file    FAMILY HISTORY:  Family History  Problem Relation Age of Onset  . Cancer - Colon Mother        age 54  . Anxiety disorder Mother   . Atrial fibrillation Father   . Atrial fibrillation Brother   . Bipolar disorder Neg Hx   . Dementia Neg Hx   .  Drug abuse Neg Hx   . Paranoid behavior Neg Hx   . Schizophrenia Neg Hx   . Seizures Neg Hx   . Sexual abuse Neg Hx   . Physical abuse Neg Hx     CURRENT MEDICATIONS:  Outpatient Encounter Medications as of 09/25/2019  Medication Sig  . aspirin 81 MG tablet Take 1 tablet (81 mg total) by mouth daily. For Polycythemia Vera.  . sertraline (ZOLOFT) 100 MG tablet Take 1.5 tablets (150 mg total) by mouth daily.  . traZODone (DESYREL) 50 MG tablet TAKE 1 TABLET(50 MG) BY MOUTH AT BEDTIME  . traMADol (ULTRAM) 50 MG tablet Take 0.5 tablets (25 mg total) by mouth every 12 (twelve) hours as needed for moderate pain. (Patient not taking: Reported on 09/25/2019)  . [DISCONTINUED] cyclobenzaprine (FLEXERIL) 10 MG tablet Take 10 mg by mouth 2 (two) times daily as needed.   No facility-administered encounter medications on file as of 09/25/2019.     ALLERGIES:  Allergies  Allergen Reactions  . Gabapentin Hives  . Hydrocodone-Acetaminophen Nausea Only  . Penicillins Other (See Comments)    Unknown child hood reaction     PHYSICAL EXAM:  ECOG Performance status: 1  Vitals:   09/25/19 1301  BP: 122/85  Pulse: 84  Resp: 18  Temp: (!) 97.5 F (36.4 C)  SpO2: 99%   Filed Weights   09/25/19 1301  Weight: 133 lb 9.6 oz (60.6 kg)    Physical Exam Constitutional:      Appearance: Normal appearance.  HENT:     Head: Normocephalic.     Right Ear: External ear normal.     Left Ear: External ear normal.     Nose: Nose normal.     Mouth/Throat:     Pharynx: Oropharynx is clear.  Eyes:     Conjunctiva/sclera: Conjunctivae normal.  Neck:     Musculoskeletal: Normal range of motion.  Cardiovascular:     Rate and Rhythm: Normal rate and regular rhythm.     Pulses: Normal pulses.     Heart sounds: Normal heart sounds.  Pulmonary:     Effort: Pulmonary effort is normal.     Breath sounds: Normal breath sounds.  Abdominal:     General: Bowel sounds are normal.  Musculoskeletal:  Normal range of motion.  Skin:    General: Skin is warm.  Neurological:     General: No focal deficit present.     Mental Status: She is alert and oriented to person, place, and time.  Psychiatric:  Mood and Affect: Mood normal.        Behavior: Behavior normal.        Thought Content: Thought content normal.        Judgment: Judgment normal.      LABORATORY DATA:  I have reviewed the labs as listed.  CBC    Component Value Date/Time   WBC 9.0 09/15/2019 1454   RBC 4.25 09/15/2019 1454   HGB 13.1 09/15/2019 1454   HGB 12.9 06/04/2018 0920   HGB 12.8 06/05/2013 1327   HCT 40.4 09/15/2019 1454   HCT 40.2 06/04/2018 0920   HCT 38.6 06/05/2013 1327   PLT 282 09/15/2019 1454   PLT 317 06/04/2018 0920   MCV 95.1 09/15/2019 1454   MCV 93 06/04/2018 0920   MCV 89.6 06/05/2013 1327   MCH 30.8 09/15/2019 1454   MCHC 32.4 09/15/2019 1454   RDW 15.5 09/15/2019 1454   RDW 14.4 06/04/2018 0920   RDW 14.4 06/05/2013 1327   LYMPHSABS 1.9 09/15/2019 1454   LYMPHSABS 1.8 06/04/2018 0920   LYMPHSABS 2.3 06/05/2013 1327   MONOABS 0.5 09/15/2019 1454   MONOABS 0.6 06/05/2013 1327   EOSABS 0.1 09/15/2019 1454   EOSABS 0.2 06/04/2018 0920   BASOSABS 0.0 09/15/2019 1454   BASOSABS 0.0 06/04/2018 0920   BASOSABS 0.1 06/05/2013 1327   CMP Latest Ref Rng & Units 09/15/2019 05/21/2019 03/09/2019  Glucose 70 - 99 mg/dL 105(H) 83 94  BUN 6 - 20 mg/dL <5(L) 5(L) 5(L)  Creatinine 0.44 - 1.00 mg/dL 0.86 0.68 0.71  Sodium 135 - 145 mmol/L 138 141 138  Potassium 3.5 - 5.1 mmol/L 3.4(L) 3.8 3.7  Chloride 98 - 111 mmol/L 106 103 104  CO2 22 - 32 mmol/L _0 Calcium 8.9 - 10.3 mg/dL 9.0 9.4 8.7(L)  Total Protein 6.5 - 8.1 g/dL 6.6 7.2 6.7  Total Bilirubin 0.3 - 1.2 mg/dL 0.3 0.5 0.4  Alkaline Phos 38 - 126 U/L 71 67 62  AST 15 - 41 U/L 15 12(L) 12(L)  ALT 0 - 44 U/L _1 ASSESSMENT & PLAN:   Polycythemia vera 1.  Jak 2 negative polycythemia: -She was diagnosed  with polycythemia, Jak 2 V6 1 7 F-. -Bone marrow biopsy was done on 07/12/2005 showed hypercellular marrow consistent with polycythemia. -She is a 30 pack-year active current smoker.  Does not have a history of thrombosis. -She reports her hot flashes have increased over the past 3 weeks which happens when she needs a phlebotomy.  She denies any itching after hot showers. -She has had a phlebotomy every 4 months since diagnosis.  Her symptoms improve after phlebotomies. -She continues to take aspirin 81 mg daily. -Labs on 05/21/2019 showed her hemoglobin is 14.4 and her hematocrit is 43.8. -Patient is requested  she go ahead and get 1 phlebotomy due to her increased night sweats and fatigue.  She is also has occasional headaches. -Patient overall feels well.  No fever chills night sweats.  No pruritus.  No flushing of the skin.  Hemoglobin is stable at 13.1, hematocrit within normal at 40.4.  No indication for phlebotomy at this time.  Patient will return to clinic in 3 months.         [place] [phone]

## 2019-09-29 ENCOUNTER — Ambulatory Visit (HOSPITAL_COMMUNITY): Payer: BC Managed Care – PPO | Admitting: Nurse Practitioner

## 2019-10-11 ENCOUNTER — Other Ambulatory Visit (HOSPITAL_COMMUNITY): Payer: Self-pay | Admitting: Psychiatry

## 2019-10-11 DIAGNOSIS — F418 Other specified anxiety disorders: Secondary | ICD-10-CM

## 2019-10-11 DIAGNOSIS — F429 Obsessive-compulsive disorder, unspecified: Secondary | ICD-10-CM

## 2019-10-11 DIAGNOSIS — F321 Major depressive disorder, single episode, moderate: Secondary | ICD-10-CM

## 2019-10-11 DIAGNOSIS — F5105 Insomnia due to other mental disorder: Secondary | ICD-10-CM

## 2019-10-12 ENCOUNTER — Encounter (HOSPITAL_COMMUNITY): Payer: Self-pay | Admitting: Hematology

## 2019-10-12 ENCOUNTER — Other Ambulatory Visit (HOSPITAL_COMMUNITY): Payer: Self-pay | Admitting: *Deleted

## 2019-10-12 MED ORDER — TRAMADOL HCL 50 MG PO TABS: 25.0000 mg | ORAL_TABLET | Freq: Two times a day (BID) | ORAL | 0 refills | Status: DC | PRN

## 2019-10-14 ENCOUNTER — Other Ambulatory Visit: Payer: Self-pay

## 2019-10-14 DIAGNOSIS — Z20822 Contact with and (suspected) exposure to covid-19: Secondary | ICD-10-CM

## 2019-10-15 ENCOUNTER — Other Ambulatory Visit: Payer: Self-pay

## 2019-10-15 ENCOUNTER — Ambulatory Visit (INDEPENDENT_AMBULATORY_CARE_PROVIDER_SITE_OTHER): Payer: BC Managed Care – PPO | Admitting: Psychiatry

## 2019-10-15 ENCOUNTER — Encounter (HOSPITAL_COMMUNITY): Payer: Self-pay | Admitting: Psychiatry

## 2019-10-15 DIAGNOSIS — F429 Obsessive-compulsive disorder, unspecified: Secondary | ICD-10-CM | POA: Diagnosis not present

## 2019-10-15 DIAGNOSIS — F321 Major depressive disorder, single episode, moderate: Secondary | ICD-10-CM | POA: Diagnosis not present

## 2019-10-15 DIAGNOSIS — F418 Other specified anxiety disorders: Secondary | ICD-10-CM | POA: Diagnosis not present

## 2019-10-15 DIAGNOSIS — F5105 Insomnia due to other mental disorder: Secondary | ICD-10-CM | POA: Diagnosis not present

## 2019-10-15 MED ORDER — TRAZODONE HCL 50 MG PO TABS: 50.0000 mg | ORAL_TABLET | Freq: Every day | ORAL | 3 refills | Status: DC

## 2019-10-15 MED ORDER — SERTRALINE HCL 100 MG PO TABS: ORAL_TABLET | ORAL | 3 refills | Status: DC

## 2019-10-15 NOTE — Progress Notes (Signed)
Virtual Visit via Video Note  I connected with Cheryl Cross on 10/15/19 at  4:20 PM EST by a video enabled telemedicine application and verified that I am speaking with the correct person using two identifiers.   I discussed the limitations of evaluation and management by telemedicine and the availability of in person appointments. The patient expressed understanding and agreed to proceed.   I discussed the assessment and treatment plan with the patient. The patient was provided an opportunity to ask questions and all were answered. The patient agreed with the plan and demonstrated an understanding of the instructions.   The patient was advised to call back or seek an in-person evaluation if the symptoms worsen or if the condition fails to improve as anticipated.  I provided 15 minutes of non-face-to-face time during this encounter.   [name], MD  [facility] MD/PA/NP OP Progress Note  10/15/2019 4:22 PM Cheryl Cross  MRN:  [id number]  Chief Complaint:  Chief Complaint    Depression; Follow-up     HPI: This patient is a53 year old separated white female who [place]. 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 She had back pain and fibromyalgia. She got hooked on taking too many Xanax and was sleeping all the time and unable to function. She [facility] and got off the Xanax and since then has been on trazodone and Zoloft. 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 She sees Dr. Jefm Miles here which she finds very helpful. She denies being depressed and stays on a good regimen of eating and sleeping. Her mood is generally been good and she's not significantly anxious. She's not abusing any substances and she denies suicidal ideation  The patient returns for follow-up after 4 months.  She is not  feeling well today has nasal congestion sore throat.  She had a coronavirus test yesterday but the result is not back.  She does not have fever or difficulty breathing.  She is staying in quarantine.  She states overall her mood has been good and she is not had any trouble sleeping.  She is not abusing any medications that she should not be taking. Visit Diagnosis:    ICD-10-CM   1. Major depressive disorder, single episode, moderate (HCC)  F32.1 sertraline (ZOLOFT) 100 MG tablet  2. Obsessive-compulsive disorder, unspecified type  F42.9 sertraline (ZOLOFT) 100 MG tablet  3. Insomnia secondary to depression with anxiety  F51.05 traZODone (DESYREL) 50 MG tablet   F41.8     Past Psychiatric History: Psychiatric hospitalization for benzodiazepine abuse in 2013  Past Medical History:  Past Medical History:  Diagnosis Date  . Abdominal pain, right upper quadrant 06/10/2018  . Anxiety   . Arthritis   . Bursitis   . DDD (degenerative disc disease)   . Depression   . Fibromyalgia   . Obsessive-compulsive disorder   . Overactive bladder   . 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 PTSD (post-traumatic stress disorder)   . PVC's (premature ventricular contractions)    pt. placed on heart monitor x 24hours, everything fine   . Sesamoiditis February 2017    Past Surgical History:  Procedure Laterality Date  . ABDOMINAL HYSTERECTOMY    . ESOPHAGOGASTRODUODENOSCOPY N/A 09/10/2014   Procedure: ESOPHAGOGASTRODUODENOSCOPY (EGD);  Surgeon: Rogene Houston, MD;  [facility];  Service: Endoscopy;  Laterality: N/A;  130 -  moved to 3:15 - Ann to notify  . LUMBAR FUSION    . TOOTH EXTRACTION Left Aug 2016    Family Psychiatric History: See below  Family History:  Family History  Problem Relation Age of Onset  . Cancer - Colon Mother        age 16  . Anxiety disorder Mother   . Atrial fibrillation Father   . Atrial fibrillation Brother   . Bipolar disorder Neg Hx   . Dementia Neg  Hx   . Drug abuse Neg Hx   . Paranoid behavior Neg Hx   . Schizophrenia Neg Hx   . Seizures Neg Hx   . Sexual abuse Neg Hx   . Physical abuse Neg Hx     Social History:  Social History   Socioeconomic History  . Marital status: Married    Spouse name: Not on file  . Number of children: Not on file  . Years of education: Not on file  . Highest education level: Not on file  Occupational History  . Not on file  Social Needs  . Financial resource strain: Not on file  . Food insecurity    Worry: Not on file    Inability: Not on file  . Transportation needs    Medical: Not on file    Non-medical: Not on file  Tobacco Use  . Smoking status: Current Every Day Smoker    Packs/day: 1.00    Years: 15.00    Pack years: 15.00    Types: Cigarettes    Start date: 11/12/1996  . Smokeless tobacco: Never Used  . Tobacco comment: 1 pack a day x 20 yrs.  Substance and Sexual Activity  . Alcohol use: No    Alcohol/week: 0.0 standard drinks  . Drug use: No    Types: Hydrocodone, Benzodiazepines  . Sexual activity: Not on file  Lifestyle  . Physical activity    Days per week: Not on file    Minutes per session: Not on file  . Stress: Not on file  Relationships  . Social Herbalist on phone: Not on file    Gets together: Not on file    Attends religious service: Not on file    Active member of club or organization: Not on file    Attends meetings of clubs or organizations: Not on file    Relationship status: Not on file  Other Topics Concern  . Not on file  Social History Narrative  . Not on file    Allergies:  Allergies  Allergen Reactions  . Gabapentin Hives  . Hydrocodone-Acetaminophen Nausea Only  . Penicillins Other (See Comments)    Unknown child hood reaction    Metabolic Disorder Labs: No results found for: HGBA1C, MPG No results found for: PROLACTIN No results found for: CHOL, TRIG, HDL, CHOLHDL, VLDL, LDLCALC Lab Results  Component Value Date    TSH 1.490 05/28/2016   TSH 0.896 03/11/2012    Therapeutic Level Labs: No results found for: LITHIUM No results found for: VALPROATE No components found for:  CBMZ  Current Medications: Current Outpatient Medications  Medication Sig Dispense Refill  . aspirin 81 MG tablet Take 1 tablet (81 mg total) by mouth daily. For Polycythemia Vera.    . sertraline (ZOLOFT) 100 MG tablet TAKE 1 AND 1/2 TABLETS(150 MG) BY MOUTH DAILY 45 tablet 3  . traMADol (ULTRAM) 50 MG tablet Take 0.5 tablets (25 mg total) by mouth every 12 (twelve) hours as  needed for moderate pain. 30 tablet 0  . traZODone (DESYREL) 50 MG tablet Take 1 tablet (50 mg total) by mouth at bedtime. 30 tablet 3   No current facility-administered medications for this visit.      Musculoskeletal: Strength & Muscle Tone: within normal limits Gait & Station: normal Patient leans: N/A  Psychiatric Specialty Exam: Review of Systems  Constitutional: Positive for malaise/fatigue.  HENT: Positive for congestion and sore throat.   All other systems reviewed and are negative.   There were no vitals taken for this visit.There is no height or weight on file to calculate BMI.  General Appearance: Casual and Fairly Groomed  Eye Contact:  Good  Speech:  Clear and Coherent  Volume:  Normal  Mood:  Euthymic  Affect:  Appropriate and Congruent  Thought Process:  Goal Directed  Orientation:  Full (Time, Place, and Person)  Thought Content: WDL   Suicidal Thoughts:  No  Homicidal Thoughts:  No  Memory:  Immediate;   Good Recent;   Good Remote;   Fair  Judgement:  Good  Insight:  Good  Psychomotor Activity:  Normal  Concentration:  Concentration: Good and Attention Span: Good  Recall:  Good  Fund of Knowledge: Good  Language: Good  Akathisia:  No  Handed:  Right  AIMS (if indicated): not done  Assets:  Communication Skills Desire for Improvement Resilience Social Support Talents/Skills Vocational/Educational  ADL's:   Intact  Cognition: WNL  Sleep:  Good   Screenings: PHQ2-9     Office Visit from 06/10/2018 in [facility] Office Visit from 06/04/2017 in [place] Office Visit from 05/28/2016 in [place] Office Visit from 07/04/2015 in [facility] Office Visit from 06/07/2014 in [place]  PHQ-2 Total Score  1  1  0  0  0       Assessment and Plan: This patient is a 53 year old female with a history of substance abuse, now in remission, depression and difficulty sleeping.  She continues to do well on her current regimen.  She will continue Zoloft 150 mg daily for depression and trazodone 50 mg at bedtime for sleep.  She will return to see me in 4 months   [name], MD 10/15/2019, 4:22 PM

## 2019-10-17 LAB — NOVEL CORONAVIRUS, NAA: SARS-CoV-2, NAA: NOT DETECTED

## 2019-11-13 DIAGNOSIS — H3552 Pigmentary retinal dystrophy: Secondary | ICD-10-CM

## 2019-11-13 HISTORY — DX: Pigmentary retinal dystrophy: H35.52

## 2019-12-31 ENCOUNTER — Other Ambulatory Visit (HOSPITAL_COMMUNITY): Payer: BC Managed Care – PPO

## 2019-12-31 ENCOUNTER — Encounter (HOSPITAL_COMMUNITY): Payer: BC Managed Care – PPO

## 2020-01-04 ENCOUNTER — Other Ambulatory Visit (HOSPITAL_COMMUNITY): Payer: Self-pay | Admitting: *Deleted

## 2020-01-04 DIAGNOSIS — D45 Polycythemia vera: Secondary | ICD-10-CM

## 2020-01-05 ENCOUNTER — Other Ambulatory Visit: Payer: Self-pay

## 2020-01-05 ENCOUNTER — Inpatient Hospital Stay (HOSPITAL_COMMUNITY): Payer: BC Managed Care – PPO | Attending: Hematology

## 2020-01-05 ENCOUNTER — Inpatient Hospital Stay (HOSPITAL_COMMUNITY): Payer: BC Managed Care – PPO

## 2020-01-05 DIAGNOSIS — D45 Polycythemia vera: Secondary | ICD-10-CM | POA: Insufficient documentation

## 2020-01-05 LAB — CBC WITH DIFFERENTIAL/PLATELET
Abs Immature Granulocytes: 0 10*3/uL (ref 0.00–0.07)
Basophils Absolute: 0 10*3/uL (ref 0.0–0.1)
Basophils Relative: 0 %
Eosinophils Absolute: 0.1 10*3/uL (ref 0.0–0.5)
Eosinophils Relative: 2 %
HCT: 41.9 % (ref 36.0–46.0)
Hemoglobin: 13.7 g/dL (ref 12.0–15.0)
Immature Granulocytes: 0 %
Lymphocytes Relative: 32 %
Lymphs Abs: 1.6 10*3/uL (ref 0.7–4.0)
MCH: 32.1 pg (ref 26.0–34.0)
MCHC: 32.7 g/dL (ref 30.0–36.0)
MCV: 98.1 fL (ref 80.0–100.0)
Monocytes Absolute: 0.4 10*3/uL (ref 0.1–1.0)
Monocytes Relative: 8 %
Neutro Abs: 2.8 10*3/uL (ref 1.7–7.7)
Neutrophils Relative %: 58 %
Platelets: 255 10*3/uL (ref 150–400)
RBC: 4.27 MIL/uL (ref 3.87–5.11)
RDW: 12.8 % (ref 11.5–15.5)
WBC: 4.9 10*3/uL (ref 4.0–10.5)
nRBC: 0 % (ref 0.0–0.2)

## 2020-01-05 LAB — COMPREHENSIVE METABOLIC PANEL
ALT: 21 U/L (ref 0–44)
AST: 19 U/L (ref 15–41)
Albumin: 3.9 g/dL (ref 3.5–5.0)
Alkaline Phosphatase: 58 U/L (ref 38–126)
Anion gap: 8 (ref 5–15)
BUN: <5 mg/dL — ABNORMAL LOW (ref 6–20)
CO2: 27 mmol/L (ref 22–32)
Calcium: 9 mg/dL (ref 8.9–10.3)
Chloride: 103 mmol/L (ref 98–111)
Creatinine, Ser: 0.62 mg/dL (ref 0.44–1.00)
GFR calc Af Amer: >60 mL/min (ref >60)
GFR calc non Af Amer: >60 mL/min (ref >60)
Glucose, Bld: 82 mg/dL (ref 70–99)
Potassium: 3.7 mmol/L (ref 3.5–5.1)
Sodium: 138 mmol/L (ref 135–145)
Total Bilirubin: 0.5 mg/dL (ref 0.3–1.2)
Total Protein: 6.2 g/dL — ABNORMAL LOW (ref 6.5–8.1)

## 2020-01-05 NOTE — Progress Notes (Signed)
Patient presents today for phlebotomy per MD orders. HGB 13. Phlebotomy procedure started at 13:58 pm and ended at 14:04 pm. 500 mls  removed. Patient observed for 10 minutes after procedure without any incident.    Patient tolerated procedure well. IV needle removed intact. No complaints at this time. Discharged from clinic ambulatory. F/U with [facility] as scheduled.

## 2020-01-05 NOTE — Patient Instructions (Signed)
[facility]  Discharge Instructions:   _______________________________________________________________  Thank you for choosing [facility] at [facility] to provide your oncology and hematology care.  To afford each patient quality time with our providers, please arrive at least 15 minutes before your scheduled appointment.  You need to re-schedule your appointment if you arrive 10 or more minutes late.  We strive to give you quality time with our providers, and arriving late affects you and other patients whose appointments are after yours.  Also, if you no show three or more times for appointments you may be dismissed from the clinic.  Again, thank you for choosing [facility] at [facility]. Our hope is that these requests will allow you access to exceptional care and in a timely manner. _______________________________________________________________  If you have questions after your visit, please contact our office at [phone] between the hours of 8:30 a.m. and 5:00 p.m. Voicemails left after 4:30 p.m. will not be returned until the following business day. _______________________________________________________________  For prescription refill requests, have your pharmacy contact our office. _______________________________________________________________  Recommendations made by the consultant and any test results will be sent to your referring physician. _______________________________________________________________ 

## 2020-01-14 ENCOUNTER — Other Ambulatory Visit: Payer: Self-pay

## 2020-01-14 ENCOUNTER — Emergency Department (HOSPITAL_COMMUNITY)
Admission: EM | Admit: 2020-01-14 | Discharge: 2020-01-14 | Disposition: A | Discharge status: Home / Self Care | Payer: BC Managed Care – PPO | Attending: Emergency Medicine | Admitting: Emergency Medicine

## 2020-01-14 ENCOUNTER — Emergency Department (HOSPITAL_COMMUNITY): Payer: BC Managed Care – PPO

## 2020-01-14 ENCOUNTER — Encounter (HOSPITAL_COMMUNITY): Payer: Self-pay | Admitting: Emergency Medicine

## 2020-01-14 DIAGNOSIS — R0789 Other chest pain: Secondary | ICD-10-CM | POA: Diagnosis not present

## 2020-01-14 DIAGNOSIS — Z79899 Other long term (current) drug therapy: Secondary | ICD-10-CM | POA: Diagnosis not present

## 2020-01-14 DIAGNOSIS — F17210 Nicotine dependence, cigarettes, uncomplicated: Secondary | ICD-10-CM | POA: Diagnosis not present

## 2020-01-14 DIAGNOSIS — R519 Headache, unspecified: Secondary | ICD-10-CM | POA: Insufficient documentation

## 2020-01-14 DIAGNOSIS — Z7982 Long term (current) use of aspirin: Secondary | ICD-10-CM | POA: Diagnosis not present

## 2020-01-14 DIAGNOSIS — R1013 Epigastric pain: Secondary | ICD-10-CM | POA: Diagnosis not present

## 2020-01-14 LAB — CBC
HCT: 39.7 % (ref 36.0–46.0)
Hemoglobin: 12.9 g/dL (ref 12.0–15.0)
MCH: 31.9 pg (ref 26.0–34.0)
MCHC: 32.5 g/dL (ref 30.0–36.0)
MCV: 98.3 fL (ref 80.0–100.0)
Platelets: 298 10*3/uL (ref 150–400)
RBC: 4.04 MIL/uL (ref 3.87–5.11)
RDW: 13 % (ref 11.5–15.5)
WBC: 6.1 10*3/uL (ref 4.0–10.5)
nRBC: 0 % (ref 0.0–0.2)

## 2020-01-14 LAB — COMPREHENSIVE METABOLIC PANEL
ALT: 18 U/L (ref 0–44)
AST: 16 U/L (ref 15–41)
Albumin: 4 g/dL (ref 3.5–5.0)
Alkaline Phosphatase: 64 U/L (ref 38–126)
Anion gap: 6 (ref 5–15)
BUN: <5 mg/dL — ABNORMAL LOW (ref 6–20)
CO2: 28 mmol/L (ref 22–32)
Calcium: 8.6 mg/dL — ABNORMAL LOW (ref 8.9–10.3)
Chloride: 102 mmol/L (ref 98–111)
Creatinine, Ser: 0.6 mg/dL (ref 0.44–1.00)
GFR calc Af Amer: >60 mL/min (ref >60)
GFR calc non Af Amer: >60 mL/min (ref >60)
Glucose, Bld: 93 mg/dL (ref 70–99)
Potassium: 3.6 mmol/L (ref 3.5–5.1)
Sodium: 136 mmol/L (ref 135–145)
Total Bilirubin: 0.4 mg/dL (ref 0.3–1.2)
Total Protein: 6.6 g/dL (ref 6.5–8.1)

## 2020-01-14 LAB — TROPONIN I (HIGH SENSITIVITY)
Troponin I (High Sensitivity): <2 ng/L (ref <18)
Troponin I (High Sensitivity): <2 ng/L (ref <18)

## 2020-01-14 LAB — LIPASE, BLOOD: Lipase: 32 U/L (ref 11–51)

## 2020-01-14 IMAGING — DX DG CHEST 2V
2 series · 2 of 2 positions shown · non-contrast
Comparison: [DATE]

CLINICAL DATA: Chest pain

EXAM:
CHEST - 2 VIEW

[chest pa]
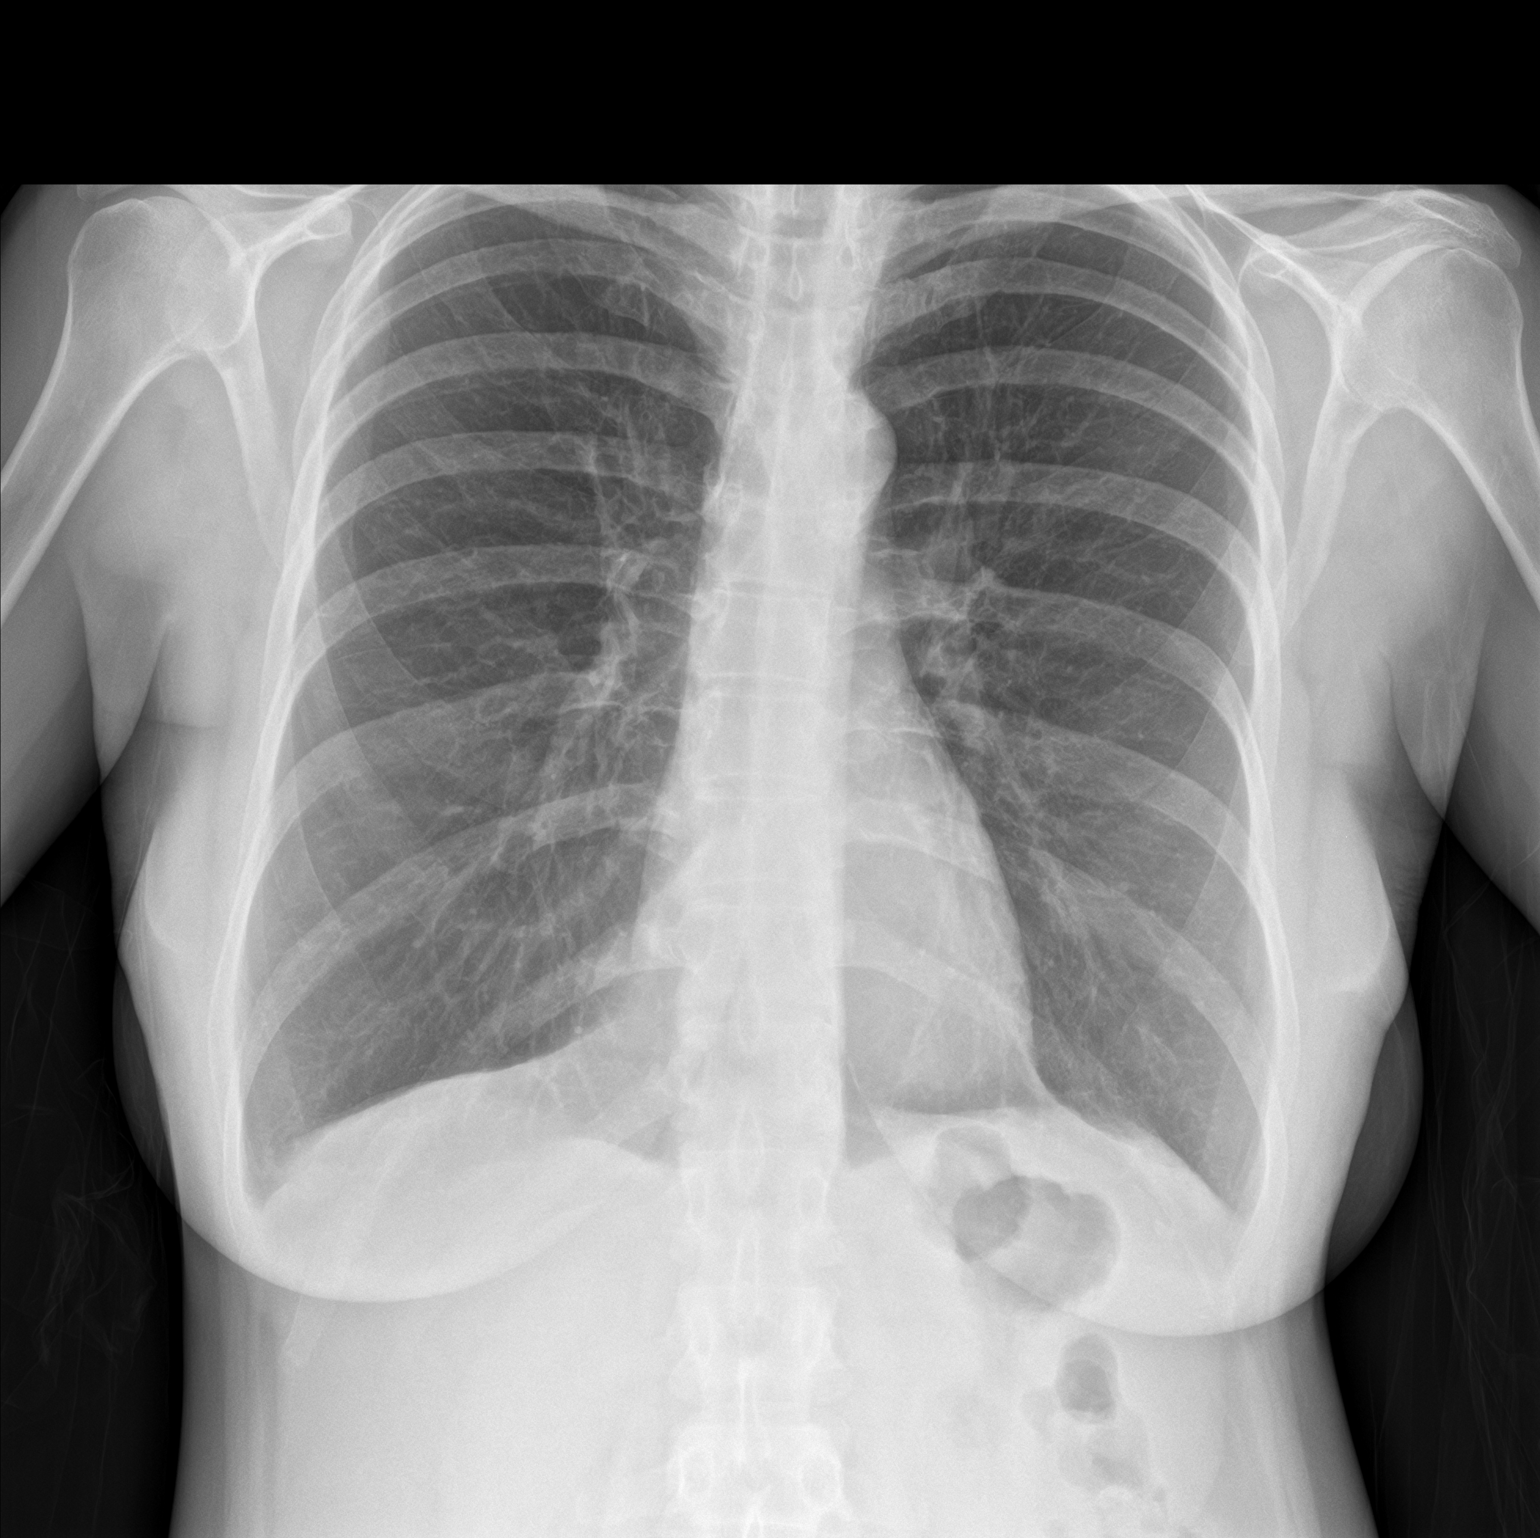

[chest lat]
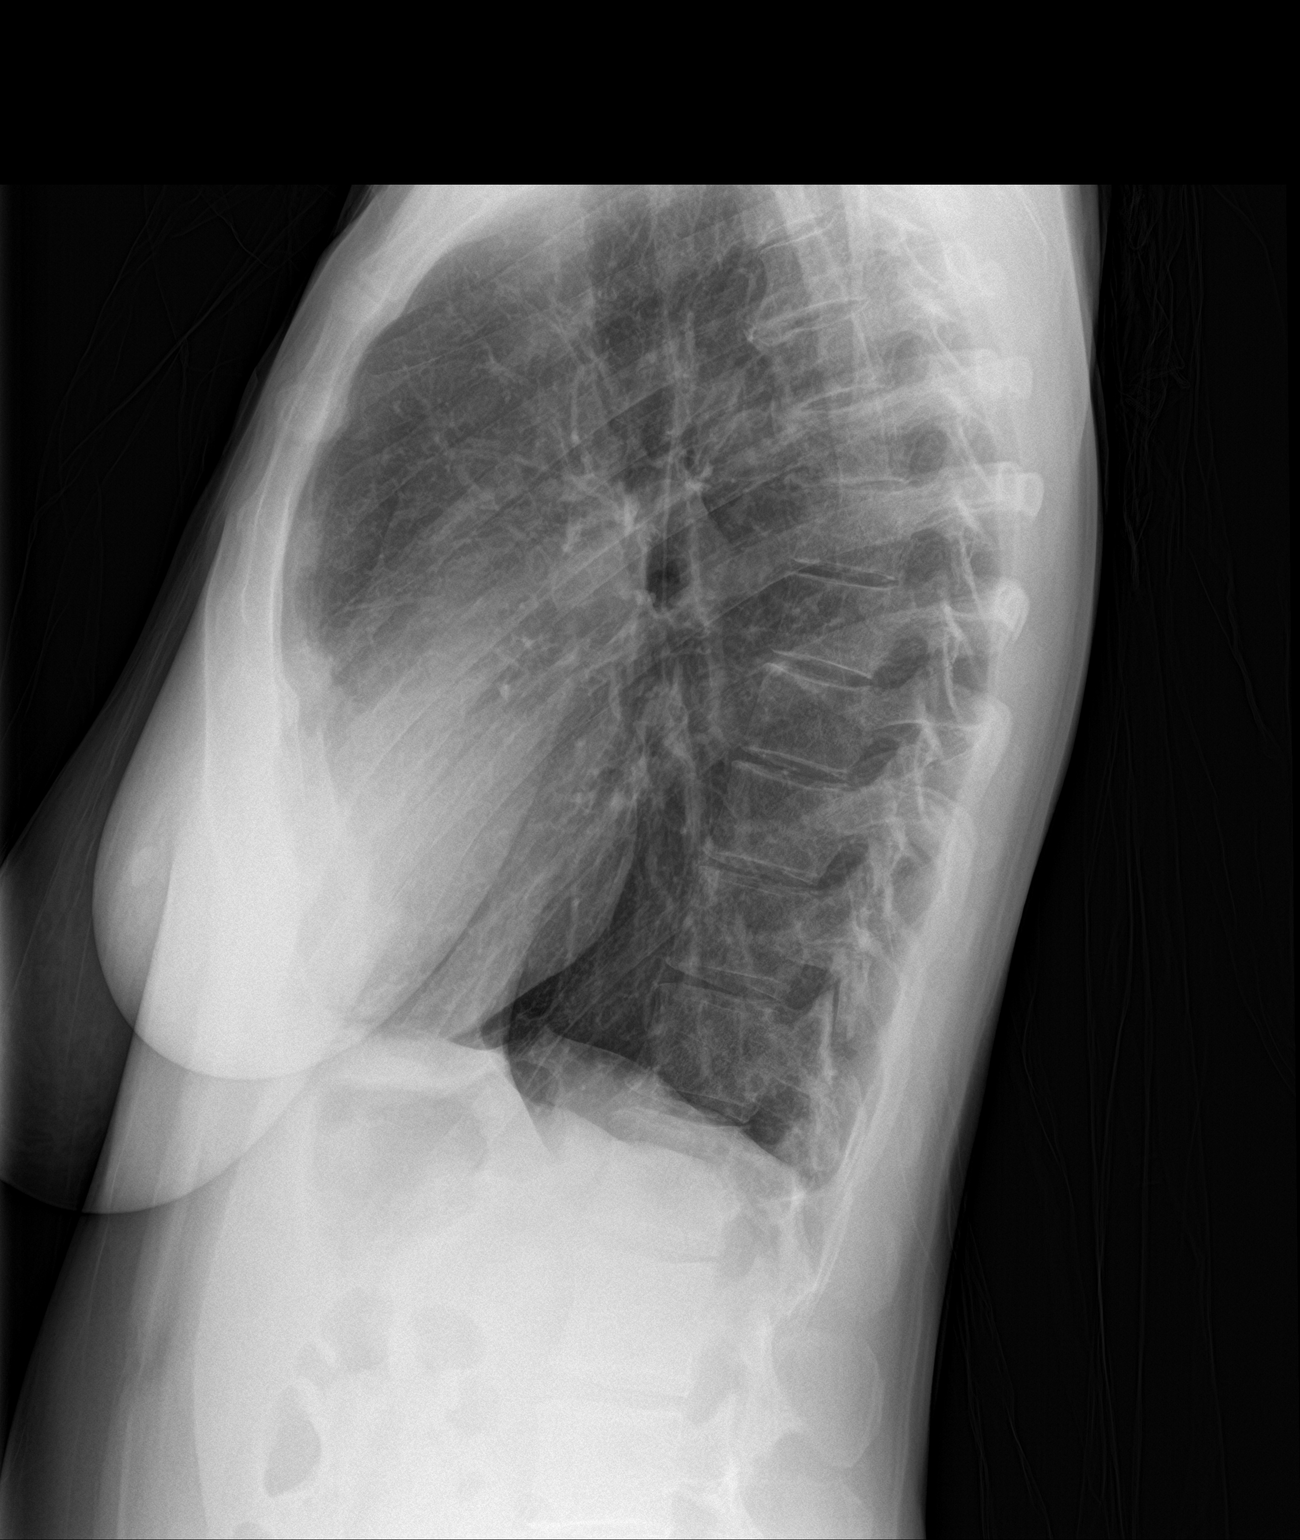

[2 of 2 positions shown; findings below may reference images not displayed]

FINDINGS: The heart size and mediastinal contours are within normal limits. No
pleural effusion or edema. The lungs are hyperinflated but clear.
The visualized skeletal structures are unremarkable.
IMPRESSION: No active cardiopulmonary disease.

## 2020-01-14 MED ORDER — SODIUM CHLORIDE 0.9% FLUSH: 3.0000 mL | Freq: Once | INTRAVENOUS | Status: DC

## 2020-01-14 MED ORDER — PROCHLORPERAZINE EDISYLATE 10 MG/2ML IJ SOLN
10.0000 mg | Freq: Once | INTRAMUSCULAR | Status: AC
  Administered 2020-01-14: 10 mg via INTRAVENOUS
  Filled 2020-01-14: qty 2

## 2020-01-14 MED ORDER — SODIUM CHLORIDE 0.9 % IV BOLUS
1000.0000 mL | Freq: Once | INTRAVENOUS | Status: AC
  Administered 2020-01-14: 1000 mL via INTRAVENOUS

## 2020-01-14 MED ORDER — FAMOTIDINE IN NACL 20-0.9 MG/50ML-% IV SOLN
20.0000 mg | Freq: Once | INTRAVENOUS | Status: AC
  Administered 2020-01-14: 20 mg via INTRAVENOUS
  Filled 2020-01-14: qty 50

## 2020-01-14 MED ORDER — DIPHENHYDRAMINE HCL 50 MG/ML IJ SOLN
12.5000 mg | Freq: Once | INTRAMUSCULAR | Status: AC
  Administered 2020-01-14: 12.5 mg via INTRAVENOUS
  Filled 2020-01-14: qty 1

## 2020-01-14 NOTE — ED Notes (Signed)
Patient signed AMA with no explanation other than she wanted to leave.

## 2020-01-14 NOTE — ED Triage Notes (Signed)
Patient complains of chest and abdominal pain for the last few days. Nausea without vomiting. Denies fevers.

## 2020-01-14 NOTE — ED Provider Notes (Signed)
[facility] Provider Note   CSN: [id number] Arrival date & time: 01/14/20  1207     History Chief Complaint  Patient presents with  . Chest Pain    Cheryl Cross is a 54 y.o. female with a history of tobacco abuse, IBS, polycythemia vera, anxiety, depression, OCD, PTSD, and fibromyalgia who presents to the emergency department with complaints of chest/abdominal pain x 2 days and intermittent headaches x 3 weeks. Patient states the chest/abdominl discomfort is located in the lower chest/epigastric area of the abdomen, it is a constant dull ache with intermittent sharp pains. No specific alleviating/aggravating factors. No change with food, exertion, or deep breath. She reports some associated nausea. Denies vomiting, diaphoresis, dizziness, syncope, leg pain/swelling, hemoptysis, recent surgery/trauma, recent long travel, hormone use, personal hx of cancer, or hx of DVT/PE. She has a history of similar chest/abdominal pain which she has been evaluated for, have looked into it being her gallbladder and all these tests were negative.  She states she has been having intermittent headaches to the left side of her head, reports a gradual onset dull ache with intermittent quick onset sharp pains. No specific alleviating or aggravating factors. No intervention prior to arrival. She has not had prior history of similar headaches. She denies visual disturbance, emesis, numbness, weakness, dizziness, speech difficulty, syncope, fever, neck stiffness, or recent head injury.  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 Denies SI or HI.    HPI     Past Medical History:  Diagnosis Date  . Abdominal pain, right upper quadrant 06/10/2018  . Anxiety   . Arthritis   . Bursitis    . DDD (degenerative disc disease)   . Depression   . Fibromyalgia   . Obsessive-compulsive disorder   . Overactive bladder   . 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 PTSD (post-traumatic stress disorder)   . PVC's (premature ventricular contractions)    pt. placed on heart monitor x 24hours, everything fine   . Sesamoiditis February 2017    Patient Active Problem List   Diagnosis Date Noted  . Cervical radiculitis 08/13/2018  . Abdominal pain, right upper quadrant 06/10/2018  . OCD (obsessive compulsive disorder) 02/25/2013  . 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 Benzodiazepine dependence ([place]) 03/13/2012  . Major depressive disorder 03/13/2012  . Polycythemia vera ([place]) 12/07/2011  . ABDOMINAL PAIN, EPIGASTRIC 03/28/2010  . IBS 01/30/2010  . ABDOMINAL PAIN, LEFT LOWER QUADRANT 01/30/2010    Past Surgical History:  Procedure Laterality Date  . ABDOMINAL HYSTERECTOMY    . ESOPHAGOGASTRODUODENOSCOPY N/A 09/10/2014   Procedure: ESOPHAGOGASTRODUODENOSCOPY (EGD);  Surgeon: Rogene Houston, MD;  Location[facility];  Service: Endoscopy;  Laterality: N/A;  130 - moved to 3:15 - Ann to notify  . LUMBAR FUSION    . TOOTH EXTRACTION Left Aug 2016     OB History    Gravida      Para      Term      Preterm      AB      Living  0     SAB      TAB      Ectopic      Multiple      Live Births  Family History  Problem Relation Age of Onset  . Cancer - Colon Mother        age 29  . Anxiety disorder Mother   . Atrial fibrillation Father   . Atrial fibrillation Brother   . Bipolar disorder Neg Hx   . Dementia Neg Hx   . Drug abuse Neg Hx   . Paranoid behavior Neg Hx   . Schizophrenia Neg Hx   . Seizures Neg Hx   . Sexual abuse Neg Hx   . Physical abuse Neg Hx     Social History   Tobacco Use  . Smoking status: Current Every Day Smoker    Packs/day: 1.00    Years: 15.00    Pack years: 15.00    Types:  Cigarettes    Start date: 11/12/1996  . Smokeless tobacco: Never Used  . Tobacco comment: 1 pack a day x 20 yrs.  Substance Use Topics  . Alcohol use: No    Alcohol/week: 0.0 standard drinks  . Drug use: No    Types: Hydrocodone, Benzodiazepines    Home Medications Prior to Admission medications   Medication Sig Start Date End Date Taking? Authorizing Provider  aspirin 81 MG tablet Take 1 tablet (81 mg total) by mouth daily. For Polycythemia Vera. 03/15/12   Greig Castilla, FNP  levofloxacin (LEVAQUIN) 500 MG tablet Take 500 mg by mouth daily. 10/19/19   [provider]  methylPREDNISolone (MEDROL DOSEPAK) 4 MG TBPK tablet FPD 10/19/19   [provider]  sertraline (ZOLOFT) 100 MG tablet TAKE 1 AND 1/2 TABLETS(150 MG) BY MOUTH DAILY 10/15/19   Cloria Spring, MD  traMADol (ULTRAM) 50 MG tablet Take 0.5 tablets (25 mg total) by mouth every 12 (twelve) hours as needed for moderate pain. 10/12/19   Lockamy, Theresia Lo, NP-C  traZODone (DESYREL) 50 MG tablet Take 1 tablet (50 mg total) by mouth at bedtime. 10/15/19   Cloria Spring, MD    Allergies    Gabapentin, Hydrocodone-acetaminophen, and Penicillins  Review of Systems   Review of Systems  Constitutional: Negative for chills and fever.  HENT: Negative for congestion, ear pain and sore throat.   Eyes: Negative for photophobia and visual disturbance.  Respiratory: Negative for cough and shortness of breath.   Cardiovascular: Positive for chest pain. Negative for leg swelling.  Gastrointestinal: Positive for abdominal pain, diarrhea (baseline w/ IBS. Unchanged) and nausea. Negative for blood in stool, constipation and vomiting.  Genitourinary: Negative for dysuria, frequency and urgency.  Neurological: Positive for headaches. Negative for dizziness, tremors, seizures, syncope, speech difficulty, weakness, light-headedness and numbness.  All other systems reviewed and are negative.   Physical Exam Updated Vital  Signs BP 117/78   Pulse 75   Temp 98.2 F (36.8 C) (Oral)   Resp 19   Ht 5\' 7"  (1.702 m)   Wt 59 kg   SpO2 99%   BMI 20.36 kg/m   Physical Exam Vitals and nursing note reviewed.  Constitutional:      General: She is not in acute distress.    Appearance: She is well-developed. She is not toxic-appearing.  HENT:     Head: Normocephalic and atraumatic.  Eyes:     General: Vision grossly intact. Gaze aligned appropriately.        Right eye: No discharge.        Left eye: No discharge.     Extraocular Movements: Extraocular movements intact.     Conjunctiva/sclera: Conjunctivae normal.  Comments: PERRL. No proptosis.   Cardiovascular:     Rate and Rhythm: Normal rate and regular rhythm.  Pulmonary:     Effort: Pulmonary effort is normal. No respiratory distress.     Breath sounds: Normal breath sounds. No wheezing, rhonchi or rales.  Abdominal:     General: There is no distension.     Palpations: Abdomen is soft.     Tenderness: There is abdominal tenderness (diffuse upper abdomen, worse in epigastrium). There is no guarding or rebound.  Musculoskeletal:     Cervical back: Normal range of motion and neck supple. No rigidity. Muscular tenderness (left paraspinal>right) present. No spinous process tenderness.  Skin:    General: Skin is warm and dry.     Findings: No rash.  Neurological:     Comments: Alert. Clear speech. No facial droop. CNIII-XII grossly intact. Bilateral upper and lower extremities' sensation grossly intact. 5/5 symmetric strength with grip strength and with plantar and dorsi flexion bilaterally . Normal finger to nose bilaterally. Negative pronator drift. Negative Romberg sign. Gait is steady and intact.   Psychiatric:        Behavior: Behavior normal.     ED Results / Procedures / Treatments   Labs (all labs ordered are listed, but only abnormal results are displayed) Labs Reviewed  COMPREHENSIVE METABOLIC PANEL - Abnormal; Notable for the following  components:      Result Value   BUN <5 (*)    Calcium 8.6 (*)    All other components within normal limits  CBC  LIPASE, BLOOD  URINALYSIS, ROUTINE W REFLEX MICROSCOPIC  TROPONIN I (HIGH SENSITIVITY)  TROPONIN I (HIGH SENSITIVITY)    EKG EKG Interpretation  Date/Time:  Thursday January 14 2020 12:19:15 EST Ventricular Rate:  93 PR Interval:  130 QRS Duration: 80 QT Interval:  348 QTC Calculation: 432 R Axis:   -47 Text Interpretation: Normal sinus rhythm Left axis deviation Low voltage QRS Nonspecific ST abnormality Abnormal ECG Confirmed by Nat Christen [phone]) on 01/14/2020 1:40:41 PM Also confirmed by Nat Christen [phone])  on 01/14/2020 2:10:54 PM   Radiology DG Chest 2 View  Result Date: 01/14/2020 CLINICAL DATA:  Chest pain EXAM: CHEST - 2 VIEW COMPARISON:  12/12/19 FINDINGS: The heart size and mediastinal contours are within normal limits. No pleural effusion or edema. The lungs are hyperinflated but clear. The visualized skeletal structures are unremarkable. IMPRESSION: No active cardiopulmonary disease. Electronically Signed   By: Kerby Moors M.D.   On: 01/14/2020 12:42    Procedures Procedures (including critical care time)  Medications Ordered in ED Medications - No data to display  ED Course  I have reviewed the triage vital signs and the nursing notes.  Pertinent labs & imaging results that were available during my care of the patient were reviewed by me and considered in my medical decision making (see chart for details).    MDM Rules/Calculators/A&P                     Patient presents to the emergency department with complaints of lower chest/abdominal discomfort for the past few days as well as intermittent headaches over the past 3 weeks.  Has had a great deal of increased stress recently.  Nontoxic, resting comfortably, vitals WNL.  Mild upper abdominal tenderness without peritoneal signs.  She states she has a history of similar discomfort, prior abdominal  ultrasound from 2019 as well as prior CT abdomen/pelvis in 2018 on chart review for additional history-  each were reassuring without acute process. Will obtain abdominal labs & give pepcid as well as heart enzymes, EKG no STEMI, low risk wells- doubt PE. In regards to her headache she has no nuchal rigidity or fever- do not suspect meningitis. No focal neurologic deficits or dizziness doubt CVA.  Not thunderclap and has been coming and going for 3 weeks- feel SAH is unlikely. No visual disturbance- doubt acute angle closure glaucoma. Based on age & no temporal artery tenderness doubt giant cell arteritis. However given no history of similar headaches will obtain CT head without contrast.  Migraine cocktail ordered.  Chest x-ray: No acute process, personally reviewed and interpreted, agree with radiologist impression. CBC: No leukocytosis or anemia. No platelet dysfunction. CMP: No significant electrolyte derangement.  LFTs WNL. Lipase: WNL Troponin I: Less than 2 after constant pain for past few days with no STEMI on EKG- doubt ACS.   Nursing staff informed me that patient eloped prior to completion of head CT and reassessment.  Per RN patient signed AMA form.  Unfortunately was unable to have formal AMA discussion with the patient or prepare proper discharge instructions as she had left the emergency department prior to me being where she was leaving.   Final Clinical Impression(s) / ED Diagnoses Final diagnoses:  Epigastric pain  Nonintractable headache, unspecified chronicity pattern, unspecified headache type    Rx / DC Orders ED Discharge Orders    None       Amaryllis Dyke, PA-C 01/14/20 1658    Nat Christen, MD 01/15/20 [phone]

## 2020-01-21 ENCOUNTER — Other Ambulatory Visit (HOSPITAL_COMMUNITY): Payer: BC Managed Care – PPO

## 2020-01-28 ENCOUNTER — Ambulatory Visit (HOSPITAL_COMMUNITY): Payer: BC Managed Care – PPO | Admitting: Hematology

## 2020-02-17 ENCOUNTER — Ambulatory Visit (INDEPENDENT_AMBULATORY_CARE_PROVIDER_SITE_OTHER): Payer: BC Managed Care – PPO | Admitting: Psychiatry

## 2020-02-17 ENCOUNTER — Other Ambulatory Visit: Payer: Self-pay

## 2020-02-17 ENCOUNTER — Encounter (HOSPITAL_COMMUNITY): Payer: Self-pay | Admitting: Psychiatry

## 2020-02-17 ENCOUNTER — Ambulatory Visit (HOSPITAL_COMMUNITY): Payer: BC Managed Care – PPO | Admitting: Psychiatry

## 2020-02-17 DIAGNOSIS — F418 Other specified anxiety disorders: Secondary | ICD-10-CM

## 2020-02-17 DIAGNOSIS — F5105 Insomnia due to other mental disorder: Secondary | ICD-10-CM | POA: Diagnosis not present

## 2020-02-17 DIAGNOSIS — F321 Major depressive disorder, single episode, moderate: Secondary | ICD-10-CM | POA: Diagnosis not present

## 2020-02-17 DIAGNOSIS — F429 Obsessive-compulsive disorder, unspecified: Secondary | ICD-10-CM

## 2020-02-17 MED ORDER — CLONAZEPAM 0.5 MG PO TABS: 0.5000 mg | ORAL_TABLET | Freq: Two times a day (BID) | ORAL | 2 refills | Status: DC | PRN

## 2020-02-17 MED ORDER — TRAZODONE HCL 50 MG PO TABS: 50.0000 mg | ORAL_TABLET | Freq: Every day | ORAL | 3 refills | Status: DC

## 2020-02-17 MED ORDER — SERTRALINE HCL 100 MG PO TABS: ORAL_TABLET | ORAL | 3 refills | Status: DC

## 2020-02-17 NOTE — Progress Notes (Signed)
Virtual Visit via Video Note  I connected with Cheryl Cross on 02/17/20 at  4:20 PM EDT by a video enabled telemedicine application and verified that I am speaking with the correct person using two identifiers.   I discussed the limitations of evaluation and management by telemedicine and the availability of in person appointments. The patient expressed understanding and agreed to proceed.    I discussed the assessment and treatment plan with the patient. The patient was provided an opportunity to ask questions and all were answered. The patient agreed with the plan and demonstrated an understanding of the instructions.   The patient was advised to call back or seek an in-person evaluation if the symptoms worsen or if the condition fails to improve as anticipated.  I provided 15 minutes of non-face-to-face time during this encounter.   Cheryl Spiller, MD  [facility] MD/PA/NP OP Progress Note  02/17/2020 4:46 PM LEYSHA DRILL  MRN:   [id number]  Chief Complaint:  Chief Complaint    Depression; Anxiety; Follow-up     HPI: This patient is a54 year oldwhite female who Cheryl Cross. 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 She had back pain and fibromyalgia. She got hooked on taking too many Xanax and was sleeping all the time and unable to function. She [facility] and got off the Xanax and since then has been on trazodone and Zoloft. 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 She sees Dr. Jefm Miles here which she finds very helpful. She denies being depressed and stays on a good regimen of eating and sleeping. Her mood is generally been good and she's not significantly anxious. She's not abusing any substances and she denies suicidal ideation  The patient returns after 4 months.  She is not doing very well by  her report.  She told me in the past that she has been separated from her husband but they have gotten back together a couple of years ago and had bought a house together.  Unfortunately on November 17, 2019 she found him dead, slumped over the bathtub.  This is been quite a shock for her.  She is also dealing with her father who has lung cancer and has developed numerous complications.  He has been in the hospital several times in the last 6 weeks.  She finds that things just tend to set her off periodically and she gets extremely anxious shaky and panicky.  She is generally sleeping well.  She is having a lot of nausea and difficulty eating and anxiety.  She denies any thoughts of self-harm or suicide.  She is still able to focus on her job for the most part.  In the past she had difficulty with Xanax but states that she was in a very different place then.  I suggested that we use a very low dosage of clonazepam to see if we can get her through this difficult time and she agrees. Visit Diagnosis:    ICD-10-CM   1. 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 Major depressive disorder, single episode, moderate (HCC)  F32.1 sertraline (ZOLOFT) 100 MG tablet  3. Obsessive-compulsive disorder, unspecified type  F42.9 sertraline (ZOLOFT) 100 MG tablet    Past Psychiatric History: Psychiatric hospitalization in 2013  Past Medical History:  Past Medical History:  Diagnosis  Date  . Abdominal pain, right upper quadrant 06/10/2018  . Anxiety   . Arthritis   . Bursitis   . DDD (degenerative disc disease)   . Depression   . Fibromyalgia   . Obsessive-compulsive disorder   . Overactive bladder   . 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 PTSD (post-traumatic stress disorder)   . PVC's (premature ventricular contractions)    pt. placed on heart monitor x 24hours, everything fine   . Sesamoiditis February 2017    Past Surgical History:  Procedure  Laterality Date  . ABDOMINAL HYSTERECTOMY    . ESOPHAGOGASTRODUODENOSCOPY N/A 09/10/2014   Procedure: ESOPHAGOGASTRODUODENOSCOPY (EGD);  Surgeon: Rogene Houston, MD;  [facility];  Service: Endoscopy;  Laterality: N/A;  130 - moved to 3:15 - Ann to notify  . LUMBAR FUSION    . TOOTH EXTRACTION Left Aug 2016    Family Psychiatric History: see below  Family History:  Family History  Problem Relation Age of Onset  . Cancer - Colon Mother        age 32  . Anxiety disorder Mother   . Atrial fibrillation Father   . Atrial fibrillation Brother   . Bipolar disorder Neg Hx   . Dementia Neg Hx   . Drug abuse Neg Hx   . Paranoid behavior Neg Hx   . Schizophrenia Neg Hx   . Seizures Neg Hx   . Sexual abuse Neg Hx   . Physical abuse Neg Hx     Social History:  Social History   Socioeconomic History  . Marital status: Married    Spouse name: Not on file  . Number of children: Not on file  . Years of education: Not on file  . Highest education level: Not on file  Occupational History  . Not on file  Tobacco Use  . Smoking status: Current Every Day Smoker    Packs/day: 1.00    Years: 15.00    Pack years: 15.00    Types: Cigarettes    Start date: 11/12/1996  . Smokeless tobacco: Never Used  . Tobacco comment: 1 pack a day x 20 yrs.  Substance and Sexual Activity  . Alcohol use: No    Alcohol/week: 0.0 standard drinks  . Drug use: No    Types: Hydrocodone, Benzodiazepines  . Sexual activity: Not on file  Other Topics Concern  . Not on file  Social History Narrative  . Not on file   Social Determinants of Health   Financial Resource Strain:   . Difficulty of Paying Living Expenses:   Food Insecurity:   . Worried About Charity fundraiser in the Last Year:   . Arboriculturist in the Last Year:   Transportation Needs:   . Film/video editor (Medical):   Marland Kitchen Lack of Transportation (Non-Medical):   Physical Activity:   . Days of Exercise per Week:   .  Minutes of Exercise per Session:   Stress:   . Feeling of Stress :   Social Connections:   . Frequency of Communication with Friends and Family:   . Frequency of Social Gatherings with Friends and Family:   . Attends Religious Services:   . Active Member of Clubs or Organizations:   . Attends Archivist Meetings:   Marland Kitchen Marital Status:     Allergies:  Allergies  Allergen Reactions  . Gabapentin Hives  . Hydrocodone-Acetaminophen Nausea Only  . Penicillins Other (See Comments)    Unknown child  hood reaction .Did it involve swelling of the face/tongue/throat, SOB, or low BP? Unknown Did it involve sudden or severe rash/hives, skin peeling, or any reaction on the inside of your mouth or nose? Unknown Did you need to seek medical attention at a hospital or doctor's office? Unknown When did it last happen? If all above answers are "NO", may proceed with cephalosporin use.     Metabolic Disorder Labs: No results found for: HGBA1C, MPG No results found for: PROLACTIN No results found for: CHOL, TRIG, HDL, CHOLHDL, VLDL, LDLCALC Lab Results  Component Value Date   TSH 1.490 05/28/2016   TSH 0.896 03/11/2012    Therapeutic Level Labs: No results found for: LITHIUM No results found for: VALPROATE No components found for:  CBMZ  Current Medications: Current Outpatient Medications  Medication Sig Dispense Refill  . aspirin 81 MG tablet Take 1 tablet (81 mg total) by mouth daily. For Polycythemia Vera.    . clonazePAM (KLONOPIN) 0.5 MG tablet Take 1 tablet (0.5 mg total) by mouth 2 (two) times daily as needed for anxiety. 60 tablet 2  . sertraline (ZOLOFT) 100 MG tablet TAKE 1 AND 1/2 TABLETS(150 MG) BY MOUTH DAILY 45 tablet 3  . traMADol (ULTRAM) 50 MG tablet Take 0.5 tablets (25 mg total) by mouth every 12 (twelve) hours as needed for moderate pain. 30 tablet 0  . traZODone (DESYREL) 50 MG tablet Take 1 tablet (50 mg total) by mouth at bedtime. 30 tablet 3   No  current facility-administered medications for this visit.     Musculoskeletal: Strength & Muscle Tone: within normal limits Gait & Station: normal Patient leans: N/A  Psychiatric Specialty Exam: Review of Systems  Psychiatric/Behavioral: Positive for dysphoric mood. The patient is nervous/anxious.   All other systems reviewed and are negative.   There were no vitals taken for this visit.There is no height or weight on file to calculate BMI.  General Appearance: Casual and Fairly Groomed  Eye Contact:  Good  Speech:  Clear and Coherent  Volume:  Normal  Mood:  Anxious and Dysphoric  Affect:  Appropriate and Congruent  Thought Process:  Goal Directed  Orientation:  Full (Time, Place, and Person)  Thought Content: Rumination   Suicidal Thoughts:  No  Homicidal Thoughts:  No  Memory:  Immediate;   Good Recent;   Good Remote;   Good  Judgement:  Good  Insight:  Good  Psychomotor Activity:  Decreased  Concentration:  Concentration: Fair and Attention Span: Fair  Recall:  Good  Fund of Knowledge: Good  Language: Good  Akathisia:  No  Handed:  Right  AIMS (if indicated): not done  Assets:  Communication Skills Desire for Improvement Resilience Social Support Talents/Skills Vocational/Educational  ADL's:  Intact  Cognition: WNL  Sleep:  Fair   Screenings: PHQ2-9     Office Visit from 06/10/2018 in [place] Office Visit from 06/04/2017 in [place] Office Visit from 05/28/2016 in [place] from 07/04/2015 in [place] from 06/07/2014 in [place]  PHQ-2 Total Score  1  1  0  0  0       Assessment and Plan: This patient is a 54 year old female with a history of substance abuse now in remission depression and difficulty sleeping.  She now has had to deal with significant stressors including her husband's death and her  father's illness.  We  will add clonazepam 0.5 mg twice daily as needed for anxiety.  She will continue Zoloft 150 mg daily for depression and trazodone 50 mg at bedtime for sleep.  She will return to see me in 4 weeks or call sooner as needed.  She is going to consider counseling.   Cheryl Spiller, MD 02/17/2020, 4:46 PM

## 2020-03-31 ENCOUNTER — Encounter (HOSPITAL_COMMUNITY): Payer: BC Managed Care – PPO

## 2020-03-31 ENCOUNTER — Other Ambulatory Visit (HOSPITAL_COMMUNITY): Payer: BC Managed Care – PPO

## 2020-04-15 ENCOUNTER — Other Ambulatory Visit: Payer: Self-pay | Admitting: Physician Assistant

## 2020-04-15 ENCOUNTER — Other Ambulatory Visit (HOSPITAL_COMMUNITY): Payer: Self-pay | Admitting: Physician Assistant

## 2020-04-15 DIAGNOSIS — R1011 Right upper quadrant pain: Secondary | ICD-10-CM

## 2020-04-19 ENCOUNTER — Encounter (HOSPITAL_COMMUNITY): Payer: Self-pay | Admitting: Psychiatry

## 2020-04-19 ENCOUNTER — Other Ambulatory Visit: Payer: Self-pay

## 2020-04-19 ENCOUNTER — Telehealth (INDEPENDENT_AMBULATORY_CARE_PROVIDER_SITE_OTHER): Payer: BC Managed Care – PPO | Admitting: Psychiatry

## 2020-04-19 DIAGNOSIS — F5105 Insomnia due to other mental disorder: Secondary | ICD-10-CM | POA: Diagnosis not present

## 2020-04-19 DIAGNOSIS — F418 Other specified anxiety disorders: Secondary | ICD-10-CM

## 2020-04-19 DIAGNOSIS — F429 Obsessive-compulsive disorder, unspecified: Secondary | ICD-10-CM

## 2020-04-19 DIAGNOSIS — F321 Major depressive disorder, single episode, moderate: Secondary | ICD-10-CM | POA: Diagnosis not present

## 2020-04-19 MED ORDER — CLONAZEPAM 0.5 MG PO TABS: 0.5000 mg | ORAL_TABLET | Freq: Two times a day (BID) | ORAL | 2 refills | Status: DC | PRN

## 2020-04-19 MED ORDER — SERTRALINE HCL 100 MG PO TABS: ORAL_TABLET | ORAL | 3 refills | Status: DC

## 2020-04-19 MED ORDER — TRAZODONE HCL 50 MG PO TABS: 50.0000 mg | ORAL_TABLET | Freq: Every day | ORAL | 3 refills | Status: DC

## 2020-04-19 NOTE — Progress Notes (Signed)
Virtual Visit via Video Note  I connected with Cheryl Cross on 04/19/20 at  1:20 PM EDT by a video enabled telemedicine application and verified that I am speaking with the correct person using two identifiers.   I discussed the limitations of evaluation and management by telemedicine and the availability of in person appointments. The patient expressed understanding and agreed to proceed.    I discussed the assessment and treatment plan with the patient. The patient was provided an opportunity to ask questions and all were answered. The patient agreed with the plan and demonstrated an understanding of the instructions.   The patient was advised to call back or seek an in-person evaluation if the symptoms worsen or if the condition fails to improve as anticipated.  I provided 15 minutes of non-face-to-face time during this encounter. Location: Provider office, patient home  [name], MD  [facility] MD/PA/NP OP Progress Note  04/19/2020 1:43 PM Cheryl Cross  MRN:  [id number]  Chief Complaint:  Chief Complaint    Depression; Anxiety; Follow-up     HPI: This patient is a54 year oldwhite female who [place]. 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 She had back pain and fibromyalgia. She got hooked on taking too many Xanax and was sleeping all the time and unable to function. [facility] and got off the Xanax and since then has been on trazodone and Zoloft. 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 She sees Dr. Jefm Miles here which she finds very helpful. She denies being depressed and stays on a good regimen of eating and sleeping. Her mood is generally been good and she's not significantly anxious. She's not abusing any substances and she denies suicidal ideation  The patient returns for  follow-up after 2 months.  Last time she told me that her husband had died suddenly in 2023/11/23 in their home.  This is been very difficult for her this year.  I did start clonazepam which is helped with her anxiety.  In general she is sleeping fairly well but still feels very tired and fatigued.  She has been working with a Social worker through hospice and this has been helpful.  She thinks the fatigue is coming from trying to pretend to be okay while she is really not.  Hopefully this summer she can have more time to herself to process what happened to her husband.  Her father is actually doing much better which is gratifying. Visit Diagnosis:    ICD-10-CM   1. Major depressive disorder, single episode, moderate (HCC)  F32.1 sertraline (ZOLOFT) 100 MG tablet  2. Insomnia secondary to depression with anxiety  F51.05 traZODone (DESYREL) 50 MG tablet   F41.8   3. Obsessive-compulsive disorder, unspecified type  F42.9 sertraline (ZOLOFT) 100 MG tablet    Past Psychiatric History: Psychiatric hospitalization in 2013  Past Medical History:  Past Medical History:  Diagnosis Date  . Abdominal pain, right upper quadrant 06/10/2018  . Anxiety   . Arthritis   . Bursitis   . DDD (degenerative disc disease)   . Depression   . Fibromyalgia   . Obsessive-compulsive disorder   . Overactive bladder   . 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 PTSD (post-traumatic stress disorder)   . PVC's (premature ventricular contractions)    pt. placed on heart monitor x 24hours, everything fine   . Sesamoiditis February  2017    Past Surgical History:  Procedure Laterality Date  . ABDOMINAL HYSTERECTOMY    . ESOPHAGOGASTRODUODENOSCOPY N/A 09/10/2014   Procedure: ESOPHAGOGASTRODUODENOSCOPY (EGD);  Surgeon: Rogene Houston, MD;  Location: [facility];  Service: Endoscopy;  Laterality: N/A;  130 - moved to 3:15 - Ann to notify  . LUMBAR FUSION    . TOOTH EXTRACTION Left Aug 2016    Family Psychiatric History:  see below  Family History:  Family History  Problem Relation Age of Onset  . Cancer - Colon Mother        age 69  . Anxiety disorder Mother   . Atrial fibrillation Father   . Atrial fibrillation Brother   . Bipolar disorder Neg Hx   . Dementia Neg Hx   . Drug abuse Neg Hx   . Paranoid behavior Neg Hx   . Schizophrenia Neg Hx   . Seizures Neg Hx   . Sexual abuse Neg Hx   . Physical abuse Neg Hx     Social History:  Social History   Socioeconomic History  . Marital status: Married    Spouse name: Not on file  . Number of children: Not on file  . Years of education: Not on file  . Highest education level: Not on file  Occupational History  . Not on file  Tobacco Use  . Smoking status: Current Every Day Smoker    Packs/day: 1.00    Years: 15.00    Pack years: 15.00    Types: Cigarettes    Start date: 11/12/1996  . Smokeless tobacco: Never Used  . Tobacco comment: 1 pack a day x 20 yrs.  Substance and Sexual Activity  . Alcohol use: No    Alcohol/week: 0.0 standard drinks  . Drug use: No    Types: Hydrocodone, Benzodiazepines  . Sexual activity: Not on file  Other Topics Concern  . Not on file  Social History Narrative  . Not on file   Social Determinants of Health   Financial Resource Strain:   . Difficulty of Paying Living Expenses:   Food Insecurity:   . Worried About Charity fundraiser in the Last Year:   . Arboriculturist in the Last Year:   Transportation Needs:   . Film/video editor (Medical):   Marland Kitchen Lack of Transportation (Non-Medical):   Physical Activity:   . Days of Exercise per Week:   . Minutes of Exercise per Session:   Stress:   . Feeling of Stress :   Social Connections:   . Frequency of Communication with Friends and Family:   . Frequency of Social Gatherings with Friends and Family:   . Attends Religious Services:   . Active Member of Clubs or Organizations:   . Attends Archivist Meetings:   Marland Kitchen Marital Status:      Allergies:  Allergies  Allergen Reactions  . Gabapentin Hives  . Hydrocodone-Acetaminophen Nausea Only  . Penicillins Other (See Comments)    Unknown child hood reaction .Did it involve swelling of the face/tongue/throat, SOB, or low BP? Unknown Did it involve sudden or severe rash/hives, skin peeling, or any reaction on the inside of your mouth or nose? Unknown Did you need to seek medical attention at a hospital or doctor's office? Unknown When did it last happen? If all above answers are "NO", may proceed with cephalosporin use.     Metabolic Disorder Labs: No results found for: HGBA1C, MPG No results found for:  PROLACTIN No results found for: CHOL, TRIG, HDL, CHOLHDL, VLDL, LDLCALC Lab Results  Component Value Date   TSH 1.490 05/28/2016   TSH 0.896 03/11/2012    Therapeutic Level Labs: No results found for: LITHIUM No results found for: VALPROATE No components found for:  CBMZ  Current Medications: Current Outpatient Medications  Medication Sig Dispense Refill  . aspirin 81 MG tablet Take 1 tablet (81 mg total) by mouth daily. For Polycythemia Vera.    . clonazePAM (KLONOPIN) 0.5 MG tablet Take 1 tablet (0.5 mg total) by mouth 2 (two) times daily as needed for anxiety. 60 tablet 2  . sertraline (ZOLOFT) 100 MG tablet TAKE 1 AND 1/2 TABLETS(150 MG) BY MOUTH DAILY 45 tablet 3  . traMADol (ULTRAM) 50 MG tablet Take 0.5 tablets (25 mg total) by mouth every 12 (twelve) hours as needed for moderate pain. 30 tablet 0  . traZODone (DESYREL) 50 MG tablet Take 1 tablet (50 mg total) by mouth at bedtime. 30 tablet 3   No current facility-administered medications for this visit.     Musculoskeletal: Strength & Muscle Tone: within normal limits Gait & Station: normal Patient leans: N/A  Psychiatric Specialty Exam: Review of Systems  Gastrointestinal: Positive for abdominal pain.  Psychiatric/Behavioral: Positive for dysphoric mood.  All other systems reviewed  and are negative.   There were no vitals taken for this visit.There is no height or weight on file to calculate BMI.  General Appearance: Casual, Neat and Well Groomed  Eye Contact:  Good  Speech:  Clear and Coherent  Volume:  Normal  Mood:  Dysphoric  Affect:  Appropriate and Congruent  Thought Process:  Goal Directed  Orientation:  Full (Time, Place, and Person)  Thought Content: Rumination   Suicidal Thoughts:  No  Homicidal Thoughts:  No  Memory:  Immediate;   Good Recent;   Good Remote;   Fair  Judgement:  Good  Insight:  Good  Psychomotor Activity:  Decreased  Concentration:  Concentration: Good and Attention Span: Good  Recall:  Good  Fund of Knowledge: Good  Language: Good  Akathisia:  No  Handed:  Right  AIMS (if indicated): not done  Assets:  Communication Skills Desire for Improvement Physical Health Resilience Social Support Talents/Skills  ADL's:  Intact  Cognition: WNL  Sleep:  Good   Screenings: PHQ2-9     Office Visit from 06/10/2018 in [facility] [place] Office Visit from 06/04/2017 in [place] Office Visit from 05/28/2016 in [place] Office Visit from 07/04/2015 in [place] Office Visit from 06/07/2014 in [place]  PHQ-2 Total Score  1  1  0  0  0       Assessment and Plan: This patient is a 54 year old female with a history of substance abuse in remission depression and difficulty sleeping.  She is still trying to process her husband's sudden death.  For now she does not want to change medications but give it a little bit more time.  She will continue Zoloft 150 mg daily for depression, trazodone 50 mg at bedtime for sleep and clonazepam 0.5 mg twice daily for anxiety.  She will return to see me in 4 weeks   [name], MD 04/19/2020, 1:43 PM

## 2020-04-20 ENCOUNTER — Other Ambulatory Visit: Payer: Self-pay

## 2020-04-20 ENCOUNTER — Ambulatory Visit (HOSPITAL_COMMUNITY)
Admission: RE | Admit: 2020-04-20 | Discharge: 2020-04-20 | Disposition: A | Discharge status: Home / Self Care | Payer: BC Managed Care – PPO | Source: Ambulatory Visit | Attending: Physician Assistant | Admitting: Physician Assistant

## 2020-04-20 DIAGNOSIS — R1011 Right upper quadrant pain: Secondary | ICD-10-CM | POA: Insufficient documentation

## 2020-04-20 IMAGING — US US ABDOMEN COMPLETE
1 series · 14 of 25 positions shown · non-contrast
Comparison: None.

CLINICAL DATA: Right upper quadrant pain

EXAM:
ABDOMEN ULTRASOUND COMPLETE

[Series 1: us abdomen complete · 14 of 107 slices shown]
[im 1/107]
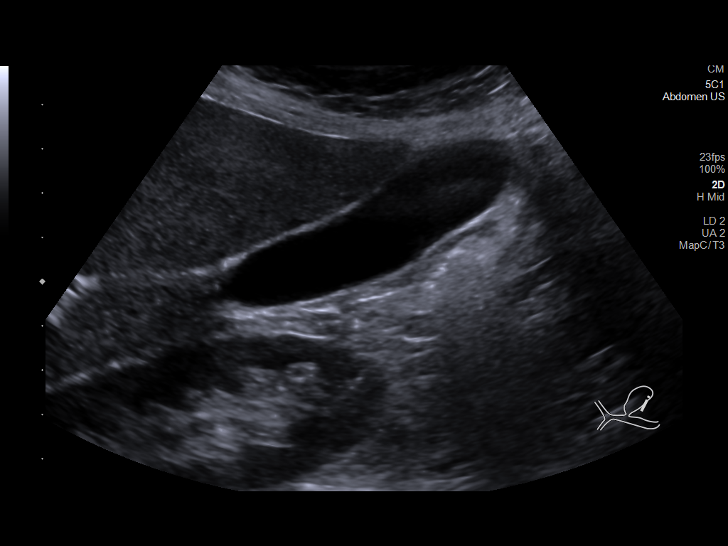
[im 9/107]
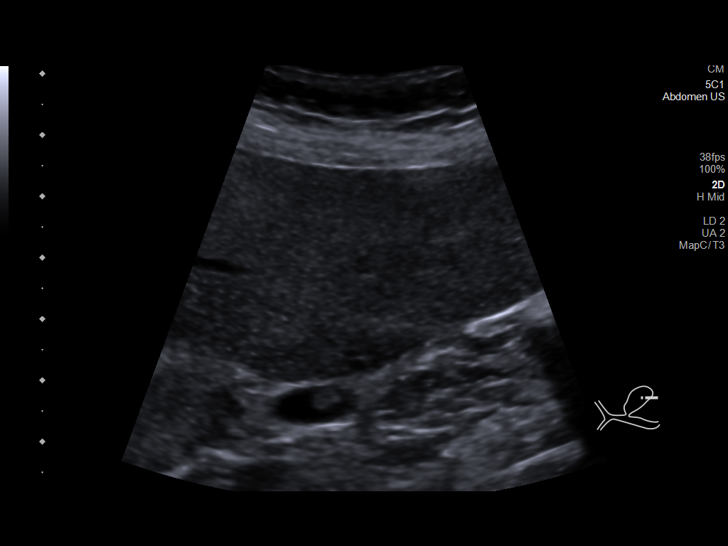
[im 18/107]
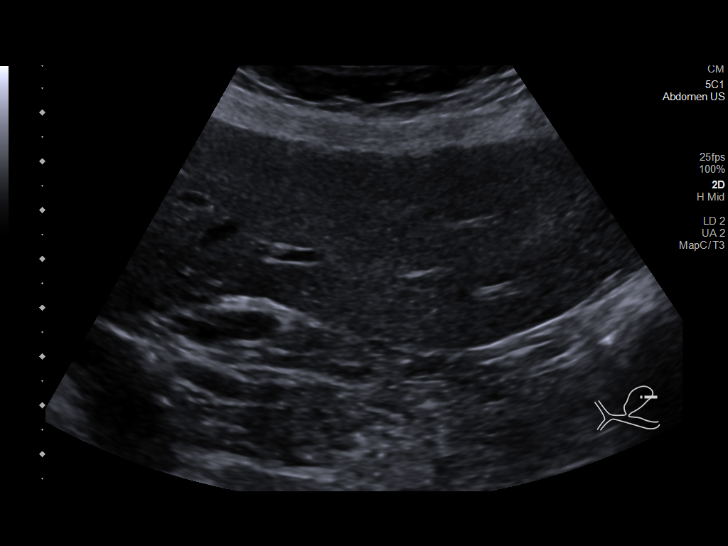
[im 27/107]
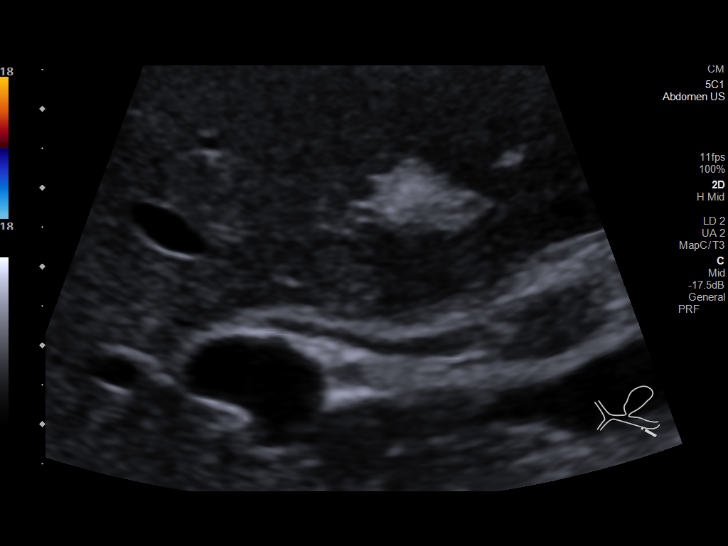
[im 36/107]
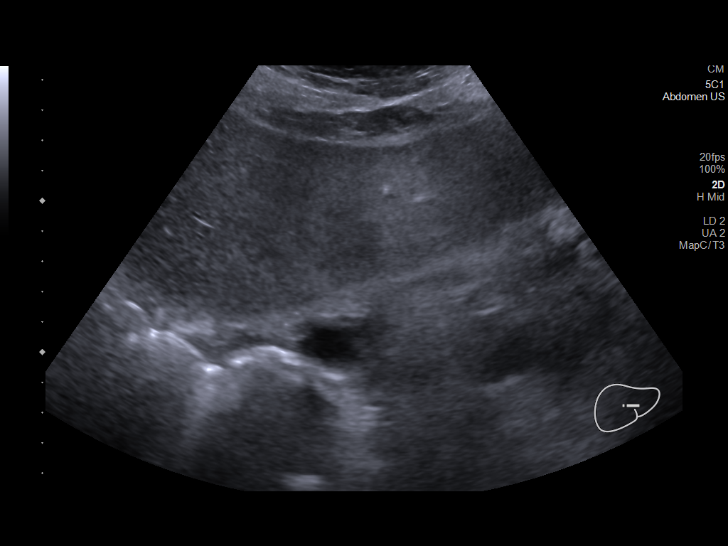
[im 40/107]
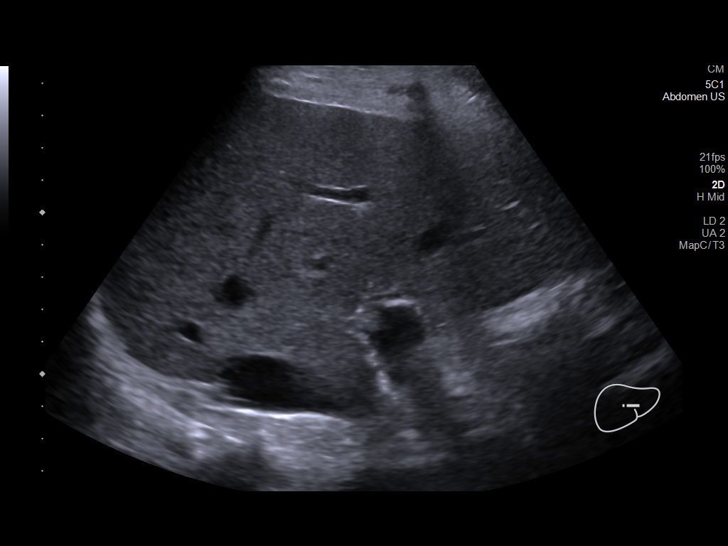
[im 49/107]
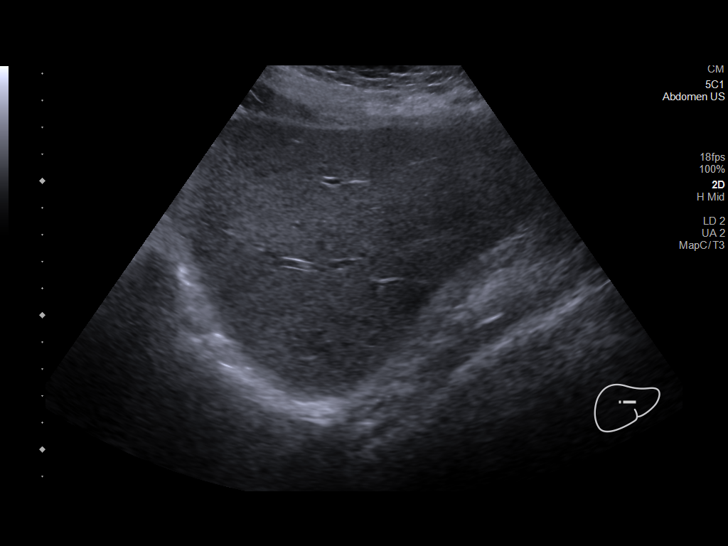
[im 58/107]
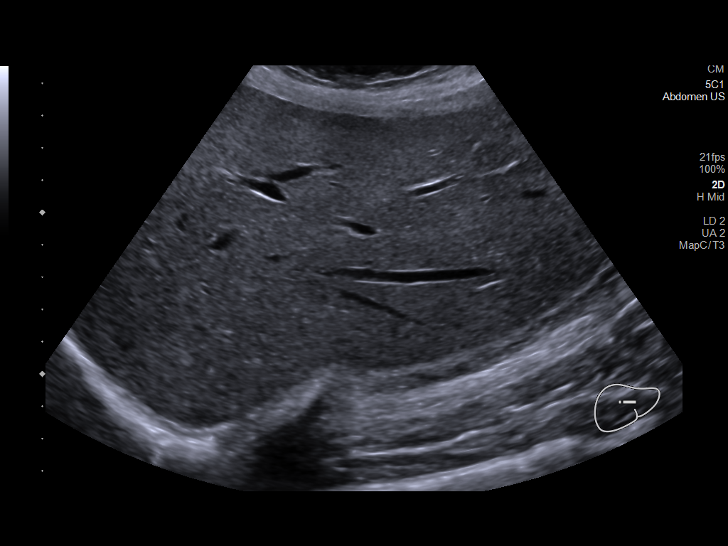
[im 67/107]
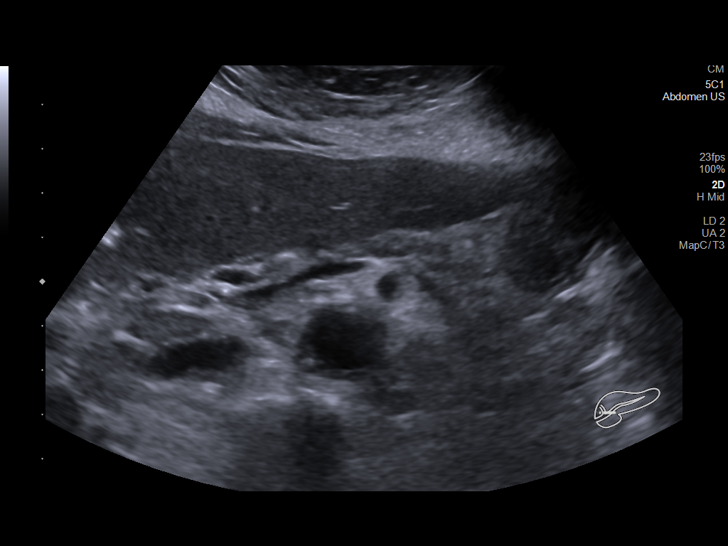
[im 71/107]
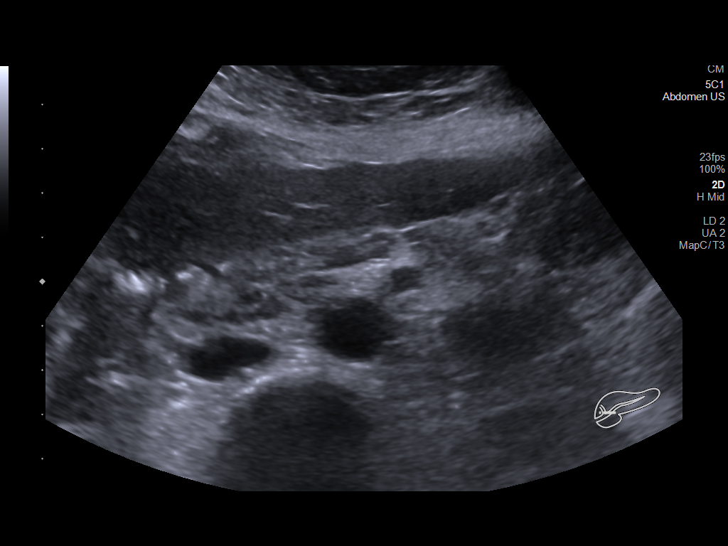
[im 80/107]
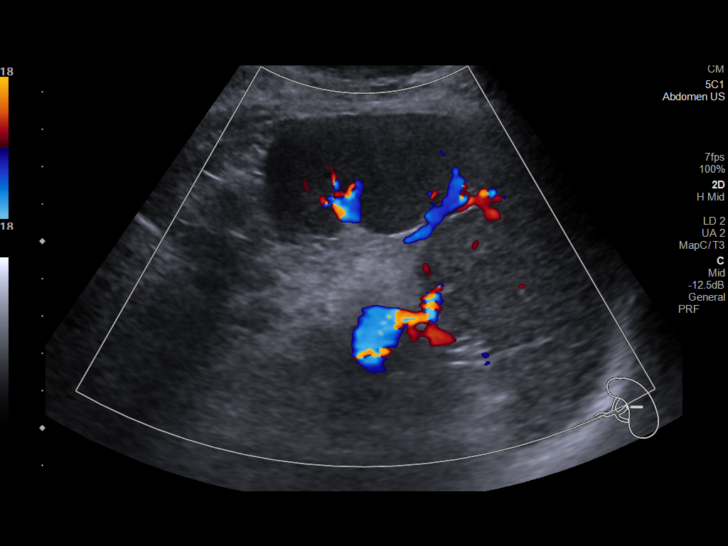
[im 89/107]
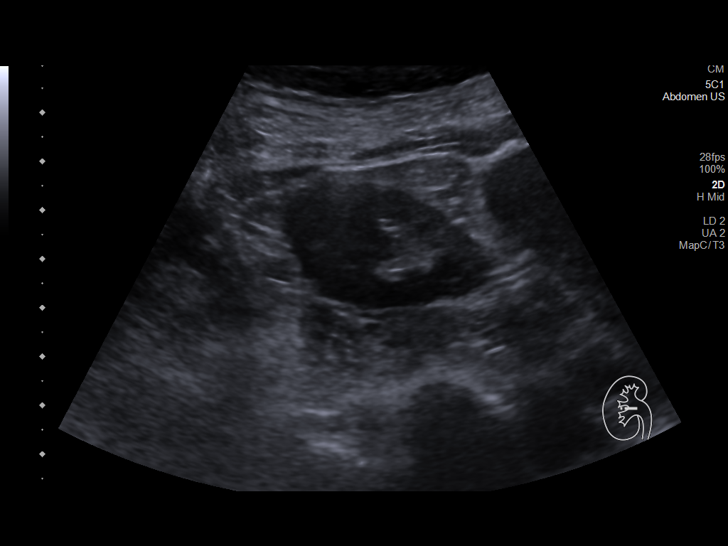
[im 98/107]
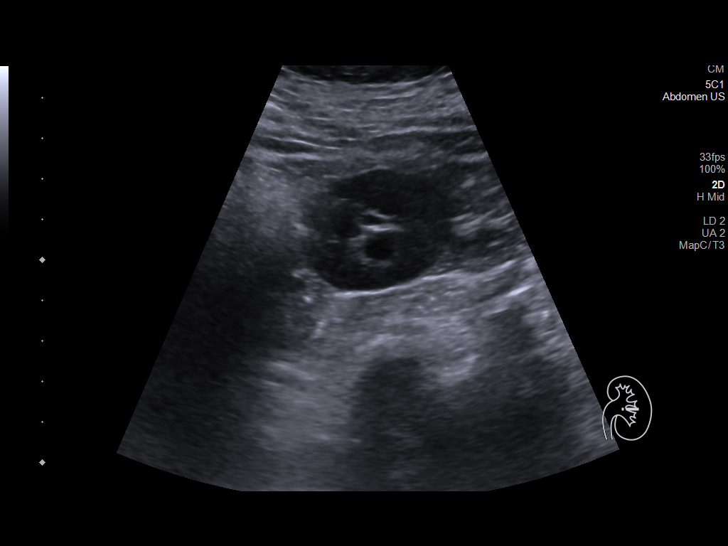
[im 107/107]
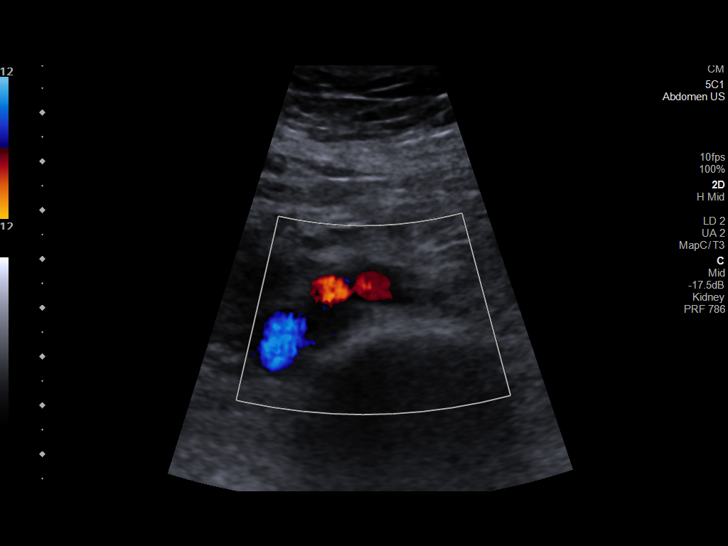

[14 of 25 positions shown; findings below may reference images not displayed]

FINDINGS: Gallbladder: Gallbladder is well distended with small polyp
identified. No wall thickening or pericholecystic fluid is seen.

Common bile duct: Diameter: 1.8 mm.

Liver: No focal lesion identified. Within normal limits in
parenchymal echogenicity. Portal vein is patent on color Doppler
imaging with normal direction of blood flow towards the liver.

IVC: No abnormality visualized.

Pancreas: Visualized portion unremarkable.

Spleen: Size and appearance within normal limits.

Right Kidney: Length: 11.9 cm. Echogenicity within normal limits. No
mass or hydronephrosis visualized.

Left Kidney: Length: 10.6 cm. Echogenicity within normal limits. No
mass or hydronephrosis visualized.

Abdominal aorta: No aneurysm visualized.

Other findings: None.
IMPRESSION: Small gallbladder polyp.  No other focal abnormality is noted.

## 2020-04-21 ENCOUNTER — Inpatient Hospital Stay (HOSPITAL_COMMUNITY): Payer: BC Managed Care – PPO

## 2020-04-21 ENCOUNTER — Inpatient Hospital Stay (HOSPITAL_COMMUNITY): Payer: BC Managed Care – PPO | Attending: Hematology

## 2020-04-21 ENCOUNTER — Encounter (HOSPITAL_COMMUNITY): Payer: Self-pay

## 2020-04-21 DIAGNOSIS — D45 Polycythemia vera: Secondary | ICD-10-CM | POA: Diagnosis not present

## 2020-04-21 LAB — CBC WITH DIFFERENTIAL/PLATELET
Abs Immature Granulocytes: 0.01 10*3/uL (ref 0.00–0.07)
Basophils Absolute: 0 10*3/uL (ref 0.0–0.1)
Basophils Relative: 0 %
Eosinophils Absolute: 0.1 10*3/uL (ref 0.0–0.5)
Eosinophils Relative: 2 %
HCT: 40 % (ref 36.0–46.0)
Hemoglobin: 13 g/dL (ref 12.0–15.0)
Immature Granulocytes: 0 %
Lymphocytes Relative: 37 %
Lymphs Abs: 1.8 10*3/uL (ref 0.7–4.0)
MCH: 30.8 pg (ref 26.0–34.0)
MCHC: 32.5 g/dL (ref 30.0–36.0)
MCV: 94.8 fL (ref 80.0–100.0)
Monocytes Absolute: 0.3 10*3/uL (ref 0.1–1.0)
Monocytes Relative: 7 %
Neutro Abs: 2.6 10*3/uL (ref 1.7–7.7)
Neutrophils Relative %: 54 %
Platelets: 221 10*3/uL (ref 150–400)
RBC: 4.22 MIL/uL (ref 3.87–5.11)
RDW: 15.3 % (ref 11.5–15.5)
WBC: 4.8 10*3/uL (ref 4.0–10.5)
nRBC: 0 % (ref 0.0–0.2)

## 2020-04-21 LAB — COMPREHENSIVE METABOLIC PANEL
ALT: 23 U/L (ref 0–44)
AST: 19 U/L (ref 15–41)
Albumin: 3.9 g/dL (ref 3.5–5.0)
Alkaline Phosphatase: 64 U/L (ref 38–126)
Anion gap: 8 (ref 5–15)
BUN: <5 mg/dL — ABNORMAL LOW (ref 6–20)
CO2: 26 mmol/L (ref 22–32)
Calcium: 8.7 mg/dL — ABNORMAL LOW (ref 8.9–10.3)
Chloride: 104 mmol/L (ref 98–111)
Creatinine, Ser: 0.64 mg/dL (ref 0.44–1.00)
GFR calc Af Amer: >60 mL/min (ref >60)
GFR calc non Af Amer: >60 mL/min (ref >60)
Glucose, Bld: 87 mg/dL (ref 70–99)
Potassium: 3.9 mmol/L (ref 3.5–5.1)
Sodium: 138 mmol/L (ref 135–145)
Total Bilirubin: 0.7 mg/dL (ref 0.3–1.2)
Total Protein: 6.5 g/dL (ref 6.5–8.1)

## 2020-04-21 NOTE — Progress Notes (Signed)
Cheryl Cross presents today for phlebotomy per MD orders. HGB 13.0 today. HCT 40.0 today.  Phlebotomy procedure started at 14:12 pm and ended at 14:19pm . 500 cc removed. Patient tolerated procedure well. IV needle removed intact.Tolerated without adverse affects. Vital signs stable. No complaints at this time. Discharged from clinic ambulatory. F/U with [facility] as scheduled.

## 2020-04-21 NOTE — Patient Instructions (Signed)
[facility]  Discharge Instructions:   _______________________________________________________________  Thank you for choosing [facility] at [facility] to provide your oncology and hematology care.  To afford each patient quality time with our providers, please arrive at least 15 minutes before your scheduled appointment.  You need to re-schedule your appointment if you arrive 10 or more minutes late.  We strive to give you quality time with our providers, and arriving late affects you and other patients whose appointments are after yours.  Also, if you no show three or more times for appointments you may be dismissed from the clinic.  Again, thank you for choosing [facility] at [facility]. Our hope is that these requests will allow you access to exceptional care and in a timely manner. _______________________________________________________________  If you have questions after your visit, please contact our office at [phone] between the hours of 8:30 a.m. and 5:00 p.m. Voicemails left after 4:30 p.m. will not be returned until the following business day. _______________________________________________________________  For prescription refill requests, have your pharmacy contact our office. _______________________________________________________________  Recommendations made by the consultant and any test results will be sent to your referring physician. _______________________________________________________________ 

## 2020-04-29 ENCOUNTER — Other Ambulatory Visit (HOSPITAL_COMMUNITY): Payer: Self-pay | Admitting: Physician Assistant

## 2020-04-29 DIAGNOSIS — R1084 Generalized abdominal pain: Secondary | ICD-10-CM

## 2020-05-09 ENCOUNTER — Other Ambulatory Visit: Payer: Self-pay

## 2020-05-09 ENCOUNTER — Encounter (HOSPITAL_COMMUNITY): Payer: Self-pay

## 2020-05-09 ENCOUNTER — Ambulatory Visit (HOSPITAL_COMMUNITY)
Admission: RE | Admit: 2020-05-09 | Discharge: 2020-05-09 | Disposition: A | Discharge status: Home / Self Care | Payer: BC Managed Care – PPO | Source: Ambulatory Visit | Attending: Physician Assistant | Admitting: Physician Assistant

## 2020-05-09 DIAGNOSIS — R1084 Generalized abdominal pain: Secondary | ICD-10-CM | POA: Insufficient documentation

## 2020-05-09 IMAGING — NM NM HEPATO W/GB/PHARM/[PERSON_NAME]
2 series · 12 of 12 positions shown · non-contrast
Comparison: None.

CLINICAL DATA: Abdominal pain and early satiety

EXAM:
NUCLEAR MEDICINE HEPATOBILIARY IMAGING WITH GALLBLADDER EF
VIEWS:
Anterior right upper quadrant
RADIOPHARMACEUTICALS:  4.85 mCi [CR]  Choletec IV

[Series 1: biliary · 3.25mm/px · 6 of 60 frames shown]
[frame 6/60]
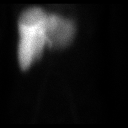
[frame 16/60]
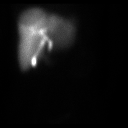
[frame 26/60]
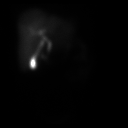
[frame 36/60]
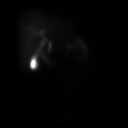
[frame 46/60]
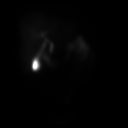
[frame 56/60]
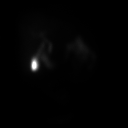

[Series 2: gbef · 3.25mm/px · 6 of 60 frames shown]
[frame 6/60]
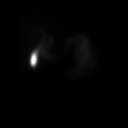
[frame 16/60]
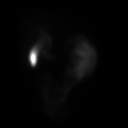
[frame 26/60]
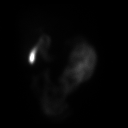
[frame 36/60]
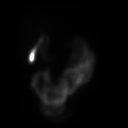
[frame 46/60]
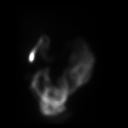
[frame 56/60]
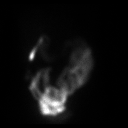

[12 of 12 positions shown; findings below may reference images not displayed]

FINDINGS: Liver uptake of radiotracer is unremarkable. There is prompt
visualization of gallbladder and small bowel, indicating patency of
the cystic and common bile ducts. Patient consumed 8 ounces of
Ensure orally with calculation of the computer generated ejection
fraction of radiotracer from the gallbladder. The patient
experienced nausea and abdominal pain with the oral Ensure
consumption. The computer generated ejection fraction of radiotracer
from the gallbladder is normal at 59%, normal greater than 33% using
the oral agent.
IMPRESSION: Normal ejection fraction of radiotracer from the gallbladder.
Patient experience clinical symptoms with the oral Ensure
consumption. Cystic and common bile ducts are patent as is evidenced
by visualization of gallbladder and small bowel.

## 2020-05-09 MED ORDER — TECHNETIUM TC 99M MEBROFENIN IV KIT
5.0000 | PACK | Freq: Once | INTRAVENOUS | Status: AC
  Administered 2020-05-09: 4.85 via INTRAVENOUS

## 2020-05-20 ENCOUNTER — Other Ambulatory Visit (HOSPITAL_COMMUNITY): Payer: Self-pay | Admitting: Internal Medicine

## 2020-05-20 DIAGNOSIS — Z1231 Encounter for screening mammogram for malignant neoplasm of breast: Secondary | ICD-10-CM

## 2020-05-30 ENCOUNTER — Telehealth: Payer: BC Managed Care – PPO | Admitting: Physician Assistant

## 2020-05-30 DIAGNOSIS — J069 Acute upper respiratory infection, unspecified: Secondary | ICD-10-CM | POA: Diagnosis not present

## 2020-05-30 MED ORDER — FLUTICASONE PROPIONATE 50 MCG/ACT NA SUSP: 2.0000 | Freq: Every day | NASAL | 0 refills | Status: DC

## 2020-05-30 MED ORDER — DOXYCYCLINE HYCLATE 100 MG PO CAPS: 100.0000 mg | ORAL_CAPSULE | Freq: Two times a day (BID) | ORAL | 0 refills | Status: AC

## 2020-05-30 NOTE — Progress Notes (Signed)
We are sorry that you are not feeling well.  Here is how we plan to help!  Based on what you have shared with me it looks like you have and upper respiratory infection such as sinusitis.  Sinusitis is inflammation and infection in the sinus cavities of the head.  Based on your presentation I believe you most likely have Acute Bacterial Sinusitis.  This is an infection caused by bacteria and is treated with antibiotics. I have prescribed Doxycycline 100mg  by mouth twice a day for 10 days. I have also prescribed Fluticasone nasal spray for your congestion. You may use an oral decongestant such as Mucinex D or if you have glaucoma or high blood pressure use plain Mucinex. Saline nasal spray help and can safely be used as often as needed for congestion.  If you develop worsening sinus pain, fever or notice severe headache and vision changes, or if symptoms are not better after completion of antibiotic, please schedule an appointment with a health care provider.    If you have a sore or scratchy throat, use a saltwater gargle-  to  teaspoon of salt dissolved in a 4-ounce to 8-ounce glass of warm water.  Gargle the solution for approximately 15-30 seconds and then spit.  It is important not to swallow the solution.  You can also use throat lozenges/cough drops and Chloraseptic spray to help with throat pain or discomfort.  Warm or cold liquids can also be helpful in relieving throat pain.   For headache, pain or general discomfort, you can use Ibuprofen or Tylenol as directed.   Some authorities believe that zinc sprays or the use of Echinacea may shorten the course of your symptoms.  Remember to wash your hands thoroughly throughout the day as this is the number one way to prevent the spread of infection!  Home Care:  Only take medications as instructed by your medical team.  Complete the entire course of an antibiotic.  Do not take these medications with alcohol.  A steam or ultrasonic humidifier  can help congestion.  You can place a towel over your head and breathe in the steam from hot water coming from a faucet.  Avoid close contacts especially the very young and the elderly.  Cover your mouth when you cough or sneeze.  Always remember to wash your hands.  Get Help Right Away If:  You develop worsening fever or sinus pain.  You develop a severe head ache or visual changes.  Your symptoms persist after you have completed your treatment plan.  Make sure you  Understand these instructions.  Will watch your condition.  Will get help right away if you are not doing well or get worse.  Your e-visit answers were reviewed by a board certified advanced clinical practitioner to complete your personal care plan.  Depending on the condition, your plan could have included both over the counter or prescription medications.  If there is a problem please reply  once you have received a response from your provider.  Your safety is important to [place].  If you have drug allergies check your prescription carefully.    You can use MyChart to ask questions about today's visit, request a non-urgent call back, or ask for a work or school excuse for 24 hours related to this e-Visit. If it has been greater than 24 hours you will need to follow up with your provider, or enter a new e-Visit to address those concerns.  You will get an e-mail in  the next two days asking about your experience.  I hope that your e-visit has been valuable and will speed your recovery. Thank you for using e-visits.  Approximately 5 minutes was spent documenting and reviewing patient's chart.

## 2020-06-03 ENCOUNTER — Ambulatory Visit (HOSPITAL_COMMUNITY): Payer: BC Managed Care – PPO

## 2020-06-06 ENCOUNTER — Telehealth (INDEPENDENT_AMBULATORY_CARE_PROVIDER_SITE_OTHER): Payer: BC Managed Care – PPO | Admitting: Psychiatry

## 2020-06-06 ENCOUNTER — Other Ambulatory Visit: Payer: Self-pay

## 2020-06-06 ENCOUNTER — Encounter (HOSPITAL_COMMUNITY): Payer: Self-pay | Admitting: Psychiatry

## 2020-06-06 DIAGNOSIS — F418 Other specified anxiety disorders: Secondary | ICD-10-CM | POA: Diagnosis not present

## 2020-06-06 DIAGNOSIS — F5105 Insomnia due to other mental disorder: Secondary | ICD-10-CM

## 2020-06-06 DIAGNOSIS — F321 Major depressive disorder, single episode, moderate: Secondary | ICD-10-CM | POA: Diagnosis not present

## 2020-06-06 DIAGNOSIS — F429 Obsessive-compulsive disorder, unspecified: Secondary | ICD-10-CM

## 2020-06-06 MED ORDER — SERTRALINE HCL 100 MG PO TABS: ORAL_TABLET | ORAL | 3 refills | Status: DC

## 2020-06-06 MED ORDER — TRAZODONE HCL 50 MG PO TABS: 50.0000 mg | ORAL_TABLET | Freq: Every day | ORAL | 3 refills | Status: DC

## 2020-06-06 MED ORDER — CLONAZEPAM 0.5 MG PO TABS: 0.5000 mg | ORAL_TABLET | Freq: Two times a day (BID) | ORAL | 2 refills | Status: DC | PRN

## 2020-06-06 NOTE — Progress Notes (Signed)
Virtual Visit via Video Note  I connected with Cheryl Cross on 06/06/20 at 10:20 AM EDT by a video enabled telemedicine application and verified that I am speaking with the correct person using two identifiers.   I discussed the limitations of evaluation and management by telemedicine and the availability of in person appointments. The patient expressed understanding and agreed to proceed    I discussed the assessment and treatment plan with the patient. The patient was provided an opportunity to ask questions and all were answered. The patient agreed with the plan and demonstrated an understanding of the instructions.   The patient was advised to call back or seek an in-person evaluation if the symptoms worsen or if the condition fails to improve as anticipated.  I provided 15 minutes of non-face-to-face time during this encounter. Location: Provider office, patient home  [name], MD  [facility] MD/PA/NP OP Progress Note  06/06/2020 10:57 AM Cheryl Cross  MRN:  [id number]  Chief Complaint:  Chief Complaint    Depression; Anxiety; Follow-up     HPI: This patient is a54 year oldwhite female who [place]. 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 She had back pain and fibromyalgia. She got hooked on taking too many Xanax and was sleeping all the time and unable to function. She [facility] and got off the Xanax and since then has been on trazodone and Zoloft. 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 She sees Dr. Jefm Miles here which she finds very helpful. She denies being depressed and stays on a good regimen of eating and sleeping. Her mood is generally been good and she's not significantly anxious. She's not abusing any substances and she denies suicidal ideation  Patient returns for  follow-up after 2 months.  She states that she has been even more stressed lately.  She lost her husband last November 21, 2023 after he died suddenly in her home.  Her father has been struggling with lung cancer this year.  He was doing better after chemotherapy but now has had a decline.  She states that he has had a bout of another type of cancer in his long and 2 lobes of the lung of collapsed.  He is probably looking at palliative/hospice care rather than any further treatment for the cancer directly.  The patient is very sad about this and spends a lot of time with her parents.  For the most part she is holding her own and has to return to school to begin teaching in about 2 weeks.  She knows that she can use family leave again if she needs to.  I suggested that she connect with a hospice counselor again and she states that she is thinking about this also.  She is sleeping well with the trazodone does think the medications continue to help her depression and anxiety. Visit Diagnosis:    ICD-10-CM   1. 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 Major depressive disorder, single episode, moderate (HCC)  F32.1 sertraline (ZOLOFT) 100 MG tablet  3. Obsessive-compulsive disorder, unspecified type  F42.9 sertraline (ZOLOFT) 100 MG tablet    Past Psychiatric History: Psychiatric hospitalization in 2013  Past Medical History:  Past Medical History:  Diagnosis Date  . Abdominal pain, right upper quadrant 06/10/2018  . Anxiety   .  Arthritis   . Bursitis   . DDD (degenerative disc disease)   . Depression   . Fibromyalgia   . Obsessive-compulsive disorder   . Overactive bladder   . 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 PTSD (post-traumatic stress disorder)   . PVC's (premature ventricular contractions)    pt. placed on heart monitor x 24hours, everything fine   . Sesamoiditis February 2017    Past Surgical History:  Procedure Laterality Date   . ABDOMINAL HYSTERECTOMY    . ESOPHAGOGASTRODUODENOSCOPY N/A 09/10/2014   Procedure: ESOPHAGOGASTRODUODENOSCOPY (EGD);  Surgeon: Rogene Houston, MD;  [facility];  Service: Endoscopy;  Laterality: N/A;  130 - moved to 3:15 - Ann to notify  . LUMBAR FUSION    . TOOTH EXTRACTION Left Aug 2016    Family Psychiatric History: See below  Family History:  Family History  Problem Relation Age of Onset  . Cancer - Colon Mother        age 87  . Anxiety disorder Mother   . Atrial fibrillation Father   . Atrial fibrillation Brother   . Bipolar disorder Neg Hx   . Dementia Neg Hx   . Drug abuse Neg Hx   . Paranoid behavior Neg Hx   . Schizophrenia Neg Hx   . Seizures Neg Hx   . Sexual abuse Neg Hx   . Physical abuse Neg Hx     Social History:  Social History   Socioeconomic History  . Marital status: Married    Spouse name: Not on file  . Number of children: Not on file  . Years of education: Not on file  . Highest education level: Not on file  Occupational History  . Not on file  Tobacco Use  . Smoking status: Current Every Day Smoker    Packs/day: 1.00    Years: 15.00    Pack years: 15.00    Types: Cigarettes    Start date: 11/12/1996  . Smokeless tobacco: Never Used  . Tobacco comment: 1 pack a day x 20 yrs.  Vaping Use  . Vaping Use: Never used  Substance and Sexual Activity  . Alcohol use: No    Alcohol/week: 0.0 standard drinks  . Drug use: No    Types: Hydrocodone, Benzodiazepines  . Sexual activity: Not on file  Other Topics Concern  . Not on file  Social History Narrative  . Not on file   Social Determinants of Health   Financial Resource Strain:   . Difficulty of Paying Living Expenses:   Food Insecurity:   . Worried About Charity fundraiser in the Last Year:   . Arboriculturist in the Last Year:   Transportation Needs:   . Film/video editor (Medical):   Marland Kitchen Lack of Transportation (Non-Medical):   Physical Activity:   . Days of  Exercise per Week:   . Minutes of Exercise per Session:   Stress:   . Feeling of Stress :   Social Connections:   . Frequency of Communication with Friends and Family:   . Frequency of Social Gatherings with Friends and Family:   . Attends Religious Services:   . Active Member of Clubs or Organizations:   . Attends Archivist Meetings:   Marland Kitchen Marital Status:     Allergies:  Allergies  Allergen Reactions  . Gabapentin Hives  . Hydrocodone-Acetaminophen Nausea Only  . Penicillins Other (See Comments)    Unknown child hood reaction .Did it involve swelling  of the face/tongue/throat, SOB, or low BP? Unknown Did it involve sudden or severe rash/hives, skin peeling, or any reaction on the inside of your mouth or nose? Unknown Did you need to seek medical attention at a hospital or doctor's office? Unknown When did it last happen? If all above answers are "NO", may proceed with cephalosporin use.     Metabolic Disorder Labs: No results found for: HGBA1C, MPG No results found for: PROLACTIN No results found for: CHOL, TRIG, HDL, CHOLHDL, VLDL, LDLCALC Lab Results  Component Value Date   TSH 1.490 05/28/2016   TSH 0.896 03/11/2012    Therapeutic Level Labs: No results found for: LITHIUM No results found for: VALPROATE No components found for:  CBMZ  Current Medications: Current Outpatient Medications  Medication Sig Dispense Refill  . aspirin 81 MG tablet Take 1 tablet (81 mg total) by mouth daily. For Polycythemia Vera.    . clonazePAM (KLONOPIN) 0.5 MG tablet Take 1 tablet (0.5 mg total) by mouth 2 (two) times daily as needed for anxiety. 60 tablet 2  . doxycycline (VIBRAMYCIN) 100 MG capsule Take 1 capsule (100 mg total) by mouth 2 (two) times daily for 7 days. 14 capsule 0  . fluticasone (FLONASE) 50 MCG/ACT nasal spray Place 2 sprays into both nostrils daily. 16 g 0  . sertraline (ZOLOFT) 100 MG tablet TAKE 1 AND 1/2 TABLETS(150 MG) BY MOUTH DAILY 45 tablet  3  . traMADol (ULTRAM) 50 MG tablet Take 0.5 tablets (25 mg total) by mouth every 12 (twelve) hours as needed for moderate pain. 30 tablet 0  . traZODone (DESYREL) 50 MG tablet Take 1 tablet (50 mg total) by mouth at bedtime. 30 tablet 3   No current facility-administered medications for this visit.     Musculoskeletal: Strength & Muscle Tone: within normal limits Gait & Station: normal Patient leans: N/A  Psychiatric Specialty Exam: Review of Systems  Psychiatric/Behavioral: The patient is nervous/anxious.   All other systems reviewed and are negative.   There were no vitals taken for this visit.There is no height or weight on file to calculate BMI.  General Appearance: Casual and Fairly Groomed  Eye Contact:  Good  Speech:  Clear and Coherent  Volume:  Normal  Mood:  Anxious and Dysphoric  Affect:  Flat  Thought Process:  Goal Directed  Orientation:  Full (Time, Place, and Person)  Thought Content: Rumination   Suicidal Thoughts:  No  Homicidal Thoughts:  No  Memory:  Immediate;   Good Recent;   Good Remote;   Fair  Judgement:  Good  Insight:  Good  Psychomotor Activity:  Normal  Concentration:  Concentration: Good and Attention Span: Good  Recall:  Good  Fund of Knowledge: Good  Language: Good  Akathisia:  No  Handed:  Right  AIMS (if indicated): not done  Assets:  Communication Skills Desire for Improvement Resilience Social Support Talents/Skills  ADL's:  Intact  Cognition: WNL  Sleep:  Good   Screenings: PHQ2-9     Office Visit from 06/10/2018 in [place] Office Visit from 06/04/2017 in [place] Office Visit from 05/28/2016 in [place] Office Visit from 07/04/2015 in [place] Office Visit from 06/07/2014 in [place]  PHQ-2 Total Score 1 1 0 0 0       Assessment and Plan: This patient is a 54 year old female with a history  of substance abuse in remission  depression and difficulty sleeping.  She states overall her medications are helpful despite the stress of dealing with her dad's cancer.  She will continue Zoloft 150 mg daily for depression, trazodone 50 mg at bedtime for sleep and clonazepam 0.5 mg twice daily as needed for anxiety.  She will return to see me in 2 months   [name], MD 06/06/2020, 10:57 AM

## 2020-06-30 ENCOUNTER — Other Ambulatory Visit (HOSPITAL_COMMUNITY): Payer: BC Managed Care – PPO

## 2020-06-30 ENCOUNTER — Encounter (HOSPITAL_COMMUNITY): Payer: BC Managed Care – PPO

## 2020-07-06 ENCOUNTER — Telehealth (HOSPITAL_COMMUNITY): Payer: Self-pay | Admitting: Psychiatry

## 2020-07-06 NOTE — Telephone Encounter (Signed)
Called to schedule follow up appt, left detailed voicemail

## 2020-07-11 ENCOUNTER — Ambulatory Visit (INDEPENDENT_AMBULATORY_CARE_PROVIDER_SITE_OTHER): Payer: BC Managed Care – PPO | Admitting: Gastroenterology

## 2020-08-04 ENCOUNTER — Telehealth (INDEPENDENT_AMBULATORY_CARE_PROVIDER_SITE_OTHER): Payer: BC Managed Care – PPO | Admitting: Psychiatry

## 2020-08-04 ENCOUNTER — Encounter (HOSPITAL_COMMUNITY): Payer: Self-pay | Admitting: Psychiatry

## 2020-08-04 ENCOUNTER — Other Ambulatory Visit: Payer: Self-pay

## 2020-08-04 DIAGNOSIS — F418 Other specified anxiety disorders: Secondary | ICD-10-CM | POA: Diagnosis not present

## 2020-08-04 DIAGNOSIS — F321 Major depressive disorder, single episode, moderate: Secondary | ICD-10-CM

## 2020-08-04 DIAGNOSIS — F429 Obsessive-compulsive disorder, unspecified: Secondary | ICD-10-CM

## 2020-08-04 DIAGNOSIS — F5105 Insomnia due to other mental disorder: Secondary | ICD-10-CM | POA: Diagnosis not present

## 2020-08-04 MED ORDER — SERTRALINE HCL 100 MG PO TABS: ORAL_TABLET | ORAL | 3 refills | Status: DC

## 2020-08-04 MED ORDER — TRAZODONE HCL 50 MG PO TABS: 50.0000 mg | ORAL_TABLET | Freq: Every day | ORAL | 3 refills | Status: DC

## 2020-08-04 MED ORDER — CLONAZEPAM 0.5 MG PO TABS: 0.5000 mg | ORAL_TABLET | Freq: Two times a day (BID) | ORAL | 2 refills | Status: DC | PRN

## 2020-08-04 NOTE — Progress Notes (Signed)
Virtual Visit via Video Note  I connected with Cheryl Cross on 08/04/20 at  4:20 PM EDT by a video enabled telemedicine application and verified that I am speaking with the correct person using two identifiers.   I discussed the limitations of evaluation and management by telemedicine and the availability of in person appointments. The patient expressed understanding and agreed to proceed.   I discussed the assessment and treatment plan with the patient. The patient was provided an opportunity to ask questions and all were answered. The patient agreed with the plan and demonstrated an understanding of the instructions.   The patient was advised to call back or seek an in-person evaluation if the symptoms worsen or if the condition fails to improve as anticipated.  I provided 15 minutes of non-face-to-face time during this encounter. Location: Provider office, patient home  Cheryl Spiller, MD  [facility] MD/PA/NP OP Progress Note  08/04/2020 4:35 PM Cheryl Cross  MRN:  [id number]  Chief Complaint:  Chief Complaint    Depression; Follow-up     HPI:  This patient is a54 year oldwhite female who [place]. 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 She had back pain and fibromyalgia. She got hooked on taking too many Xanax and was sleeping all the time and unable to function. She was hospitalized at [facility] and got off the Xanax and since then has been on trazodone and Zoloft. 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 She sees Dr. Jefm Miles here which she finds very helpful. She denies being depressed and stays on a good regimen of eating and sleeping. Her mood is generally been good and she's not significantly anxious. She's not abusing any substances and she denies suicidal ideation  The patient returns for follow-up  after 3 months.  She states that she has been under a lot of stress lately.  As noted in previous notes her father has had lung cancer.  Right now he is fairly stable.  She lost her husband last 53-01-08 after he died suddenly in the home.  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  She also is having a new neuromuscular problem with her vision and now has to see a neurologist.  In spite of all this she feels like she is holding her own of the medications for depression anxiety and sleep have been helpful. Visit Diagnosis:    ICD-10-CM   1. Major depressive disorder, single episode, moderate (HCC)  F32.1 sertraline (ZOLOFT) 100 MG tablet  2. Obsessive-compulsive disorder, unspecified type  F42.9 sertraline (ZOLOFT) 100 MG tablet  3. Insomnia secondary to depression with anxiety  F51.05 traZODone (DESYREL) 50 MG tablet   F41.8     Past Psychiatric History: Psychiatric hospitalization in 2013  Past Medical History:  Past Medical History:  Diagnosis Date  . Abdominal pain, right upper quadrant 06/10/2018  . Anxiety   . Arthritis   . Bursitis   . DDD (degenerative disc disease)   . Depression   . Fibromyalgia   . Obsessive-compulsive disorder   . Overactive bladder   . 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 PTSD (post-traumatic stress disorder)   . PVC's (premature ventricular contractions)    pt. placed on heart monitor x 24hours, everything fine   . Sesamoiditis February 2017    Past Surgical  History:  Procedure Laterality Date  . ABDOMINAL HYSTERECTOMY    . ESOPHAGOGASTRODUODENOSCOPY N/A 09/10/2014   Procedure: ESOPHAGOGASTRODUODENOSCOPY (EGD);  Surgeon: Rogene Houston, MD;  [facility];  Service: Endoscopy;  Laterality: N/A;  130 - moved to 3:15 - Ann to notify  . LUMBAR FUSION    . TOOTH EXTRACTION Left Aug 2016    Family Psychiatric History: see below  Family History:  Family History   Problem Relation Age of Onset  . Cancer - Colon Mother        age 74  . Anxiety disorder Mother   . Atrial fibrillation Father   . Atrial fibrillation Brother   . Bipolar disorder Neg Hx   . Dementia Neg Hx   . Drug abuse Neg Hx   . Paranoid behavior Neg Hx   . Schizophrenia Neg Hx   . Seizures Neg Hx   . Sexual abuse Neg Hx   . Physical abuse Neg Hx     Social History:  Social History   Socioeconomic History  . Marital status: Married    Spouse name: Not on file  . Number of children: Not on file  . Years of education: Not on file  . Highest education level: Not on file  Occupational History  . Not on file  Tobacco Use  . Smoking status: Current Every Day Smoker    Packs/day: 1.00    Years: 15.00    Pack years: 15.00    Types: Cigarettes    Start date: 11/12/1996  . Smokeless tobacco: Never Used  . Tobacco comment: 1 pack a day x 20 yrs.  Vaping Use  . Vaping Use: Never used  Substance and Sexual Activity  . Alcohol use: No    Alcohol/week: 0.0 standard drinks  . Drug use: No    Types: Hydrocodone, Benzodiazepines  . Sexual activity: Not on file  Other Topics Concern  . Not on file  Social History Narrative  . Not on file   Social Determinants of Health   Financial Resource Strain:   . Difficulty of Paying Living Expenses: Not on file  Food Insecurity:   . Worried About Charity fundraiser in the Last Year: Not on file  . Ran Out of Food in the Last Year: Not on file  Transportation Needs:   . Lack of Transportation (Medical): Not on file  . Lack of Transportation (Non-Medical): Not on file  Physical Activity:   . Days of Exercise per Week: Not on file  . Minutes of Exercise per Session: Not on file  Stress:   . Feeling of Stress : Not on file  Social Connections:   . Frequency of Communication with Friends and Family: Not on file  . Frequency of Social Gatherings with Friends and Family: Not on file  . Attends Religious Services: Not on file  .  Active Member of Clubs or Organizations: Not on file  . Attends Archivist Meetings: Not on file  . Marital Status: Not on file    Allergies:  Allergies  Allergen Reactions  . Gabapentin Hives  . Hydrocodone-Acetaminophen Nausea Only  . Penicillins Other (See Comments)    Unknown child hood reaction .Did it involve swelling of the face/tongue/throat, SOB, or low BP? Unknown Did it involve sudden or severe rash/hives, skin peeling, or any reaction on the inside of your mouth or nose? Unknown Did you need to seek medical attention at a hospital or doctor's office? Unknown When did  it last happen? If all above answers are "NO", may proceed with cephalosporin use.     Metabolic Disorder Labs: No results found for: HGBA1C, MPG No results found for: PROLACTIN No results found for: CHOL, TRIG, HDL, CHOLHDL, VLDL, LDLCALC Lab Results  Component Value Date   TSH 1.490 05/28/2016   TSH 0.896 03/11/2012    Therapeutic Level Labs: No results found for: LITHIUM No results found for: VALPROATE No components found for:  CBMZ  Current Medications: Current Outpatient Medications  Medication Sig Dispense Refill  . aspirin 81 MG tablet Take 1 tablet (81 mg total) by mouth daily. For Polycythemia Vera.    . clonazePAM (KLONOPIN) 0.5 MG tablet Take 1 tablet (0.5 mg total) by mouth 2 (two) times daily as needed for anxiety. 60 tablet 2  . fluticasone (FLONASE) 50 MCG/ACT nasal spray Place 2 sprays into both nostrils daily. 16 g 0  . sertraline (ZOLOFT) 100 MG tablet TAKE 1 AND 1/2 TABLETS(150 MG) BY MOUTH DAILY 45 tablet 3  . traMADol (ULTRAM) 50 MG tablet Take 0.5 tablets (25 mg total) by mouth every 12 (twelve) hours as needed for moderate pain. 30 tablet 0  . traZODone (DESYREL) 50 MG tablet Take 1 tablet (50 mg total) by mouth at bedtime. 30 tablet 3   No current facility-administered medications for this visit.     Musculoskeletal: Strength & Muscle Tone: within  normal limits Gait & Station: normal Patient leans: N/A  Psychiatric Specialty Exam: Review of Systems  Eyes: Positive for visual disturbance.  Psychiatric/Behavioral: Positive for dysphoric mood.  All other systems reviewed and are negative.   There were no vitals taken for this visit.There is no height or weight on file to calculate BMI.  General Appearance: Casual and Fairly Groomed  Eye Contact:  Good  Speech:  Clear and Coherent  Volume:  Normal  Mood:  Dysphoric  Affect:  Appropriate and Congruent  Thought Process:  Goal Directed  Orientation:  Full (Time, Place, and Person)  Thought Content: Rumination   Suicidal Thoughts:  No  Homicidal Thoughts:  No  Memory:  Immediate;   Good Recent;   Good Remote;   Good  Judgement:  Good  Insight:  Good  Psychomotor Activity:  Decreased  Concentration:  Concentration: Good and Attention Span: Good  Recall:  Good  Fund of Knowledge: Good  Language: Good  Akathisia:  No  Handed:  Right  AIMS (if indicated): not done  Assets:  Communication Skills Desire for Improvement Resilience Social Support Talents/Skills  ADL's:  Intact  Cognition: WNL  Sleep:  Good   Screenings: PHQ2-9     Office Visit from 06/10/2018 in [place] Office Visit from 06/04/2017 in [place] Office Visit from 05/28/2016 in [place] Office Visit from 07/04/2015 in [place] Visit from 06/07/2014 in [place]  PHQ-2 Total Score 1 1 0 0 0       Assessment and Plan: This patient is a 54 year old female with a history of substance abuse in remission depression and difficulty sleeping.  She feels that despite all her stressors the medications have been helpful.  She will continue Zoloft 150 mg daily for depression, trazodone 50 mg at bedtime for sleep and clonazepam 0.5 mg twice daily as needed for anxiety.  She will return to  see me in 3 months   Cheryl Spiller, MD 08/04/2020, 4:35 PM

## 2020-08-12 ENCOUNTER — Other Ambulatory Visit: Payer: Self-pay | Admitting: Family Medicine

## 2020-08-12 ENCOUNTER — Other Ambulatory Visit (HOSPITAL_COMMUNITY): Payer: Self-pay | Admitting: Family Medicine

## 2020-08-12 DIAGNOSIS — H527 Unspecified disorder of refraction: Secondary | ICD-10-CM

## 2020-08-23 ENCOUNTER — Other Ambulatory Visit: Payer: Self-pay

## 2020-08-23 ENCOUNTER — Ambulatory Visit (HOSPITAL_COMMUNITY)
Admission: RE | Admit: 2020-08-23 | Discharge: 2020-08-23 | Disposition: A | Discharge status: Home / Self Care | Payer: BC Managed Care – PPO | Source: Ambulatory Visit | Attending: Family Medicine | Admitting: Family Medicine

## 2020-08-23 DIAGNOSIS — H527 Unspecified disorder of refraction: Secondary | ICD-10-CM | POA: Diagnosis not present

## 2020-08-23 IMAGING — MR MR HEAD WO/W CM
14 of 17 series · 27 of 48 positions shown · IV contrast (6ml Gadavist)
Comparison: Maxillofacial CT [DATE]

CLINICAL DATA: Problems with depth perception. Weight loss and loss
of appetite.

EXAM:
MRI HEAD WITHOUT AND WITH CONTRAST
TECHNIQUE: Multiplanar, multiecho pulse sequences of the brain and surrounding
structures were obtained without and with intravenous contrast.
CONTRAST:  6mL GADAVIST GADOBUTROL 1 MMOL/ML IV SOLN

[Series 5: DWI · axial · 3.0mm · 0.77mm/px · z∈[-68,+63]mm · 3 of 48 slices shown (1 of 6)]
[im 1/48]
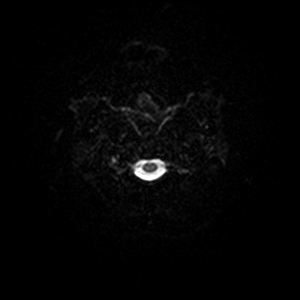
[im 24/48]
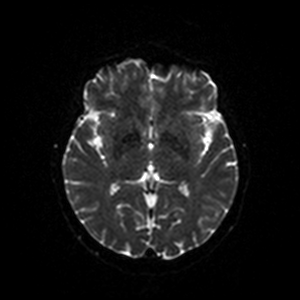
[im 48/48]
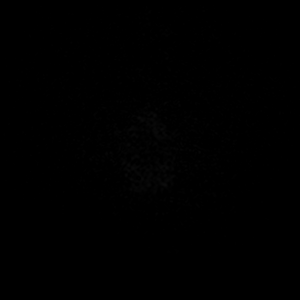

[Series 5: DWI · axial · 3.0mm · 0.77mm/px · z∈[-68,+63]mm · 3 of 48 slices shown (2 of 6)]
[im 1/48]
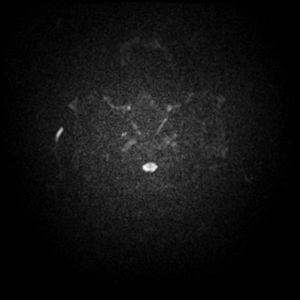
[im 24/48]
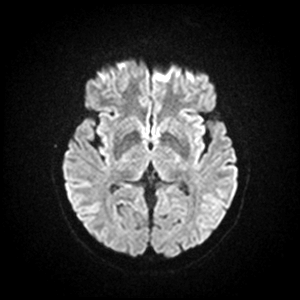
[im 48/48]
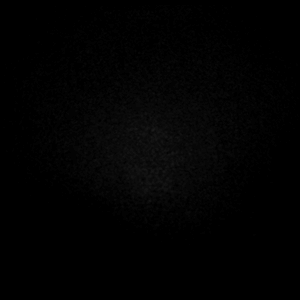

[Series 6: DWI · axial · 3.0mm · 0.77mm/px · z∈[-68,+58]mm · 3 of 46 slices shown (3 of 6)]
[im 1/46]
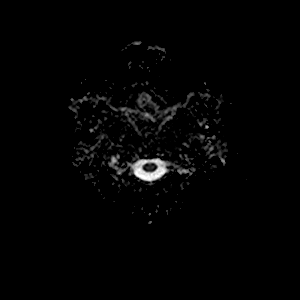
[im 23/46]
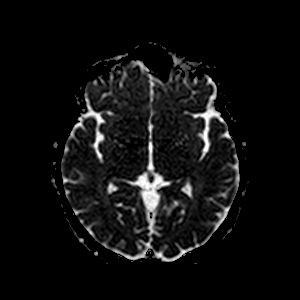
[im 46/46]
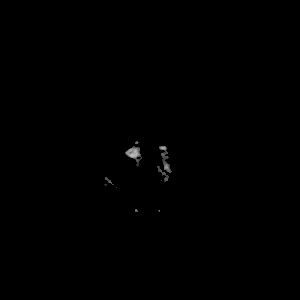

[Series 7: DWI · coronal · 5.0mm · 0.88mm/px · 1 of 28 slices shown (4 of 6)]
[im 1/28]
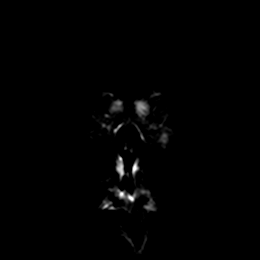

[Series 7: DWI · coronal · 5.0mm · 0.88mm/px · 1 of 28 slices shown (5 of 6)]
[im 1/28]
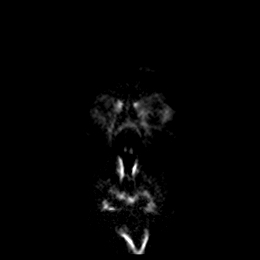

[Series 8: DWI · coronal · 5.0mm · 0.88mm/px · 1 of 28 slices shown (6 of 6)]
[im 1/28]
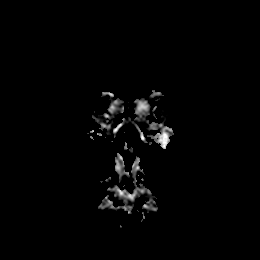

[Series 9: T1 · sagittal · 5.0mm · 0.75mm/px · 1 of 19 slices shown]
[im 1/19]
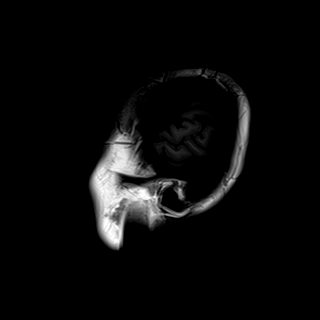

[Series 10: T2 · axial · 5.0mm · 0.72mm/px · 1 of 20 slices shown]
[im 1/20]
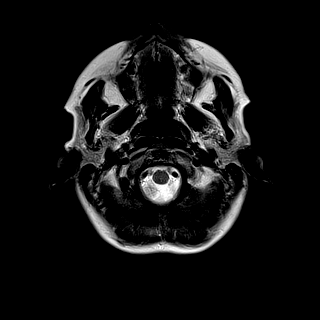

[Series 11: mag_images · axial · 3.0mm · 0.90mm/px · z∈[-85,+80]mm · 3 of 60 slices shown]
[im 1/60]
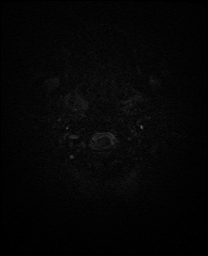
[im 30/60]
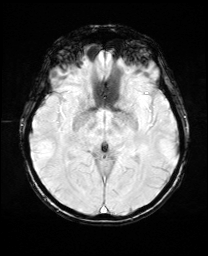
[im 60/60]
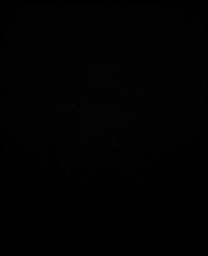

[Series 12: pha_images · axial · 3.0mm · 0.90mm/px · z∈[-83,+75]mm · 3 of 56 slices shown]
[im 1/56]
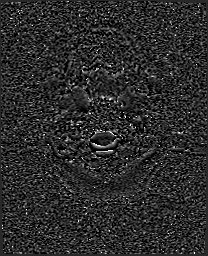
[im 28/56]
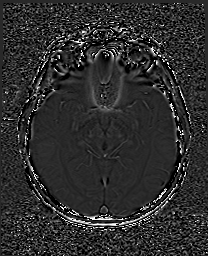
[im 56/56]
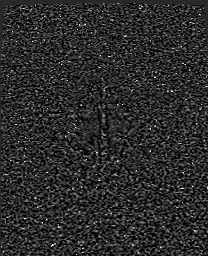

[Series 13: swi_images · axial · 3.0mm · 0.90mm/px · z∈[-85,+80]mm · 3 of 60 slices shown]
[im 1/60]
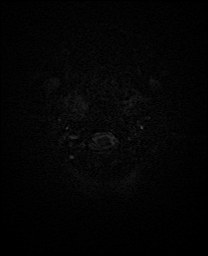
[im 30/60]
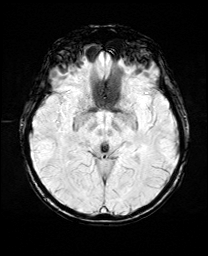
[im 60/60]
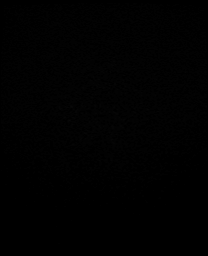

[Series 15: FLAIR · axial · 3.0mm · 0.45mm/px · z∈[-57,+64]mm · 2 of 44 slices shown]
[im 1/44]
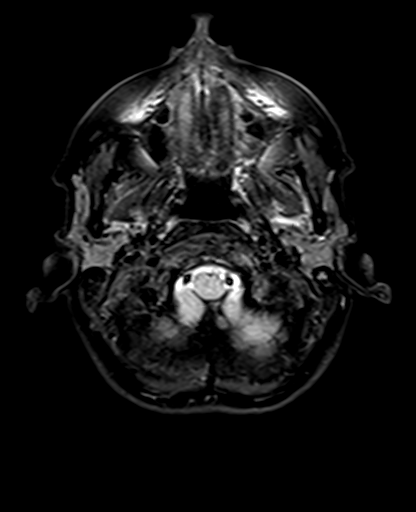
[im 44/44]
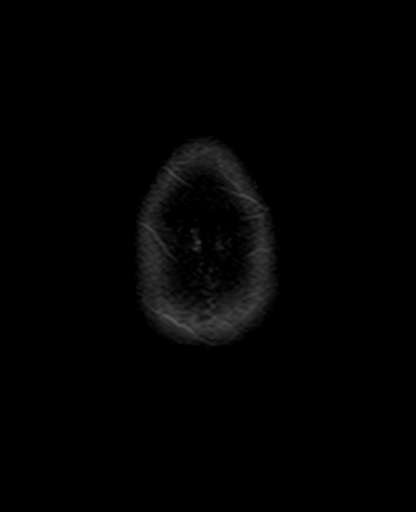

[Series 18: T2 post-contrast · coronal · 5.0mm · 0.72mm/px · 1 of 28 slices shown]
[im 1/28]
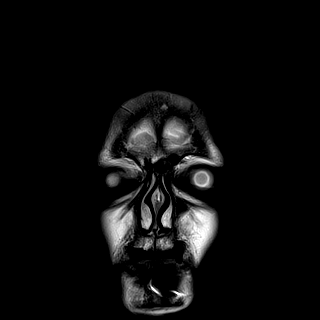

[Series 20: T1 post-contrast · coronal · 5.0mm · 0.34mm/px · 1 of 28 slices shown]
[im 1/28]
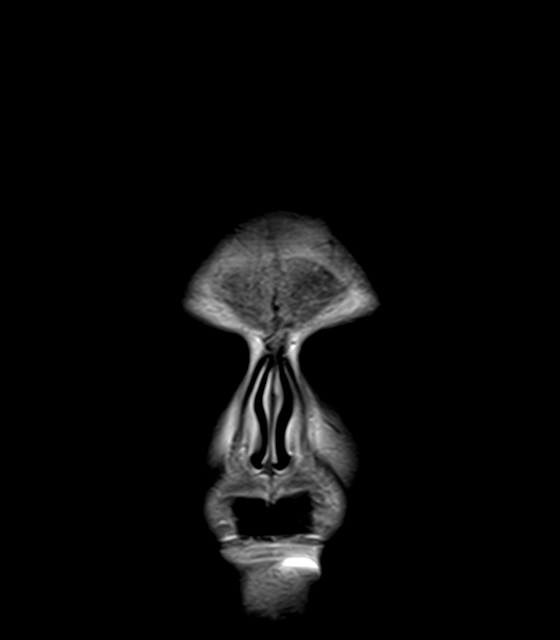

[27 of 48 positions shown; findings below may reference images not displayed]

FINDINGS: Brain: No acute infarction, hemorrhage, hydrocephalus, extra-axial
collection or mass lesion. Tiny remote infarct involving the right
caudate body. There are a few small T2/FLAIR hyperintensities within
the white matter, nonspecific but most likely secondary to chronic
microvascular ischemic disease and not advanced for patient age. No
abnormal enhancement.

Vascular: Proximal arterial flow voids are maintained at the skull
base.

Skull and upper cervical spine: No focal marrow replacing process.

Sinuses/Orbits: The sinuses are clear. No evidence of acute orbital
abnormality.

Other: Small left greater than right mastoid effusions.
IMPRESSION: 1. No evidence of acute intracranial abnormality.
2. Tiny remote infarct involving the right caudate body.

## 2020-08-23 MED ORDER — GADOBUTROL 1 MMOL/ML IV SOLN
6.0000 mL | Freq: Once | INTRAVENOUS | Status: AC | PRN
  Administered 2020-08-23: 6 mL via INTRAVENOUS

## 2020-08-25 ENCOUNTER — Other Ambulatory Visit (HOSPITAL_COMMUNITY): Payer: Self-pay

## 2020-08-25 DIAGNOSIS — D45 Polycythemia vera: Secondary | ICD-10-CM

## 2020-08-26 ENCOUNTER — Inpatient Hospital Stay (HOSPITAL_COMMUNITY): Payer: BC Managed Care – PPO | Attending: Hematology

## 2020-08-26 ENCOUNTER — Inpatient Hospital Stay (HOSPITAL_BASED_OUTPATIENT_CLINIC_OR_DEPARTMENT_OTHER): Payer: BC Managed Care – PPO | Admitting: Oncology

## 2020-08-26 ENCOUNTER — Inpatient Hospital Stay (HOSPITAL_COMMUNITY): Payer: BC Managed Care – PPO

## 2020-08-26 ENCOUNTER — Other Ambulatory Visit: Payer: Self-pay

## 2020-08-26 VITALS — BP 113/62 | HR 81 | Resp 18 | Wt 112.7 lb

## 2020-08-26 DIAGNOSIS — M2550 Pain in unspecified joint: Secondary | ICD-10-CM | POA: Insufficient documentation

## 2020-08-26 DIAGNOSIS — F17210 Nicotine dependence, cigarettes, uncomplicated: Secondary | ICD-10-CM | POA: Insufficient documentation

## 2020-08-26 DIAGNOSIS — F17200 Nicotine dependence, unspecified, uncomplicated: Secondary | ICD-10-CM | POA: Diagnosis not present

## 2020-08-26 DIAGNOSIS — Z634 Disappearance and death of family member: Secondary | ICD-10-CM | POA: Diagnosis not present

## 2020-08-26 DIAGNOSIS — R634 Abnormal weight loss: Secondary | ICD-10-CM

## 2020-08-26 DIAGNOSIS — D45 Polycythemia vera: Secondary | ICD-10-CM | POA: Diagnosis present

## 2020-08-26 DIAGNOSIS — Z7982 Long term (current) use of aspirin: Secondary | ICD-10-CM | POA: Diagnosis not present

## 2020-08-26 LAB — CBC WITH DIFFERENTIAL/PLATELET
Abs Immature Granulocytes: 0.01 10*3/uL (ref 0.00–0.07)
Basophils Absolute: 0 10*3/uL (ref 0.0–0.1)
Basophils Relative: 1 %
Eosinophils Absolute: 0.1 10*3/uL (ref 0.0–0.5)
Eosinophils Relative: 1 %
HCT: 38.6 % (ref 36.0–46.0)
Hemoglobin: 12.7 g/dL (ref 12.0–15.0)
Immature Granulocytes: 0 %
Lymphocytes Relative: 35 %
Lymphs Abs: 1.5 10*3/uL (ref 0.7–4.0)
MCH: 32.5 pg (ref 26.0–34.0)
MCHC: 32.9 g/dL (ref 30.0–36.0)
MCV: 98.7 fL (ref 80.0–100.0)
Monocytes Absolute: 0.3 10*3/uL (ref 0.1–1.0)
Monocytes Relative: 8 %
Neutro Abs: 2.4 10*3/uL (ref 1.7–7.7)
Neutrophils Relative %: 55 %
Platelets: 201 10*3/uL (ref 150–400)
RBC: 3.91 MIL/uL (ref 3.87–5.11)
RDW: 15.9 % — ABNORMAL HIGH (ref 11.5–15.5)
WBC: 4.3 10*3/uL (ref 4.0–10.5)
nRBC: 0 % (ref 0.0–0.2)

## 2020-08-26 LAB — COMPREHENSIVE METABOLIC PANEL
ALT: 17 U/L (ref 0–44)
AST: 20 U/L (ref 15–41)
Albumin: 3.5 g/dL (ref 3.5–5.0)
Alkaline Phosphatase: 55 U/L (ref 38–126)
Anion gap: 4 — ABNORMAL LOW (ref 5–15)
BUN: <5 mg/dL — ABNORMAL LOW (ref 6–20)
CO2: 29 mmol/L (ref 22–32)
Calcium: 8.9 mg/dL (ref 8.9–10.3)
Chloride: 104 mmol/L (ref 98–111)
Creatinine, Ser: 0.56 mg/dL (ref 0.44–1.00)
GFR, Estimated: >60 mL/min (ref >60)
Glucose, Bld: 79 mg/dL (ref 70–99)
Potassium: 3.6 mmol/L (ref 3.5–5.1)
Sodium: 137 mmol/L (ref 135–145)
Total Bilirubin: 0.6 mg/dL (ref 0.3–1.2)
Total Protein: 5.8 g/dL — ABNORMAL LOW (ref 6.5–8.1)

## 2020-08-26 MED ORDER — TRAMADOL HCL 50 MG PO TABS
25.0000 mg | ORAL_TABLET | Freq: Two times a day (BID) | ORAL | 0 refills | Status: DC | PRN
Start: 2020-08-26 — End: 2020-09-05

## 2020-08-26 NOTE — Assessment & Plan Note (Addendum)
1.  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 -Bone marrow biopsy was done on 07/12/2005 showed hypercellular marrow consistent with polycythemia. -She is a 30 pack-year active current smoker.  Does not have a history of thrombosis. -She reports her hot flashes have increased over the past 3 weeks which happens when she needs a phlebotomy.  She denies any itching after hot showers. -She has had a phlebotomy every 4 months since diagnosis.  Her symptoms improve after phlebotomies. -She continues to take aspirin 81 mg daily. -Labs from today are stable.  Hemoglobin is 12.7 with a hematocrit of 38.6.   She will not require a phlebotomy today. -RTC in 4 months for repeat labs and possible phlebotomy.  Patient will call clinic if she develops symptoms such as hot flashes to get lab work sooner.  2. Weight loss -Secondary to loss of husband. -States she does not have an appetite. -Given her smoking history, would recommend low-dose CT screening.  Patient qualifies.  Will refer to ldct screening program.  3. Joint/muscle pain secondary to PV ??? -Previously prescribed tramadol -Patient asking for refill. -Reviewed PDMP-okay to refill   Disposition- RTC in 4 months for labs, MD assessment and possible phlebotomy. Referral to low-dose CT screening program.

## 2020-08-26 NOTE — Progress Notes (Signed)
No Phlebotomy needed today per JBurns NP. Discharged from clinic ambulatory in stable condition. Alert and oriented x 3. F/U with [facility] as scheduled.

## 2020-08-26 NOTE — Progress Notes (Signed)
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 female returns for routine follow-up for polycythemia.   She was last seen in clinic about a year ago.  Her last phlebotomy was in June 2021-hemoglobin 13 hematocrit 40.  500 mL removed.    She reports not having a very good year.  She lost her husband in December 2020 unexpectedly.  She also developed vision changes about 6 months ago and recently had an MRI of her brain that did not reveal evidence of acute abnormality.  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  She reports inability to focus.  She is scheduled to see a neurologist in December.  She also reports having significant fatigue and little to no appetite.  She continues to lose weight.  Her dad is a cancer patient and is seen here at [facility] and is currently undergoing treatment.  She is unable to drive secondary to her vision changes.  She reports not being able to work and was on [employer] for several months.  She denies any hot flashes at this time.  She does not wish to have a phlebotomy today as she does not need it.     REVIEW OF SYSTEMS:  Review of Systems  Constitutional: Positive for appetite change, fatigue and unexpected weight change. Negative for fever.  HENT:   Negative for nosebleeds, sore throat and trouble swallowing.   Eyes: Negative.   Respiratory: Negative.  Negative for cough, shortness of breath and wheezing.   Cardiovascular: Negative.  Negative for chest pain and leg swelling.  Gastrointestinal: Negative for abdominal pain, blood in stool, constipation, diarrhea, nausea and vomiting.  Endocrine: Negative.   Genitourinary: Negative.  Negative  for bladder incontinence, hematuria and nocturia.   Musculoskeletal: Positive for myalgias. Negative for back pain and flank pain.  Skin: Negative.   Neurological: Positive for dizziness, headaches and numbness. Negative for light-headedness.  Hematological: Negative.   Psychiatric/Behavioral: Negative.  Negative for confusion. The patient is not nervous/anxious.      PAST MEDICAL/SURGICAL HISTORY:  Past Medical History:  Diagnosis Date  . Abdominal pain, right upper quadrant 06/10/2018  . Anxiety   . Arthritis   . Bursitis   . DDD (degenerative disc disease)   . Depression   . Fibromyalgia   . Obsessive-compulsive disorder   . Overactive bladder   . 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 PTSD (post-traumatic stress disorder)   . PVC's (premature ventricular contractions)    pt. placed on heart monitor x 24hours, everything fine   . Sesamoiditis February 2017   Past Surgical History:  Procedure Laterality Date  . ABDOMINAL HYSTERECTOMY    . ESOPHAGOGASTRODUODENOSCOPY N/A 09/10/2014   Procedure: ESOPHAGOGASTRODUODENOSCOPY (EGD);  Surgeon: Rogene Houston, MD;  [facility];  Service: Endoscopy;  Laterality: N/A;  130 - moved to 3:15 - Ann to notify  . LUMBAR FUSION    . TOOTH EXTRACTION Left Aug 2016     SOCIAL HISTORY:  Social History   Socioeconomic History  . Marital status: Married    Spouse name: Not on file  . Number of children: Not on file  . Years of education: Not on file  . Highest education level:  Not on file  Occupational History  . Not on file  Tobacco Use  . Smoking status: Current Every Day Smoker    Packs/day: 1.00    Years: 15.00    Pack years: 15.00    Types: Cigarettes    Start date: 11/12/1996  . Smokeless tobacco: Never Used  . Tobacco comment: 1 pack a day x 20 yrs.  Vaping Use  . Vaping Use: Never used  Substance and Sexual Activity  . Alcohol use: No    Alcohol/week: 0.0 standard drinks  . Drug use: No    Types:  Hydrocodone, Benzodiazepines  . Sexual activity: Not on file  Other Topics Concern  . Not on file  Social History Narrative  . Not on file   Social Determinants of Health   Financial Resource Strain:   . Difficulty of Paying Living Expenses: Not on file  Food Insecurity:   . Worried About Charity fundraiser in the Last Year: Not on file  . Ran Out of Food in the Last Year: Not on file  Transportation Needs:   . Lack of Transportation (Medical): Not on file  . Lack of Transportation (Non-Medical): Not on file  Physical Activity:   . Days of Exercise per Week: Not on file  . Minutes of Exercise per Session: Not on file  Stress:   . Feeling of Stress : Not on file  Social Connections:   . Frequency of Communication with Friends and Family: Not on file  . Frequency of Social Gatherings with Friends and Family: Not on file  . Attends Religious Services: Not on file  . Active Member of Clubs or Organizations: Not on file  . Attends Archivist Meetings: Not on file  . Marital Status: Not on file  Intimate Partner Violence:   . Fear of Current or Ex-Partner: Not on file  . Emotionally Abused: Not on file  . Physically Abused: Not on file  . Sexually Abused: Not on file    FAMILY HISTORY:  Family History  Problem Relation Age of Onset  . Cancer - Colon Mother        age 59  . Anxiety disorder Mother   . Atrial fibrillation Father   . Atrial fibrillation Brother   . Bipolar disorder Neg Hx   . Dementia Neg Hx   . Drug abuse Neg Hx   . Paranoid behavior Neg Hx   . Schizophrenia Neg Hx   . Seizures Neg Hx   . Sexual abuse Neg Hx   . Physical abuse Neg Hx     CURRENT MEDICATIONS:  Outpatient Encounter Medications as of 08/26/2020  Medication Sig  . aspirin 81 MG tablet Take 1 tablet (81 mg total) by mouth daily. For Polycythemia Vera.  . clonazePAM (KLONOPIN) 0.5 MG tablet Take 1 tablet (0.5 mg total) by mouth 2 (two) times daily as needed for anxiety.  .  sertraline (ZOLOFT) 100 MG tablet TAKE 1 AND 1/2 TABLETS(150 MG) BY MOUTH DAILY  . traMADol (ULTRAM) 50 MG tablet Take 0.5 tablets (25 mg total) by mouth every 12 (twelve) hours as needed for moderate pain.  . traZODone (DESYREL) 50 MG tablet Take 1 tablet (50 mg total) by mouth at bedtime.  . [DISCONTINUED] fluticasone (FLONASE) 50 MCG/ACT nasal spray Place 2 sprays into both nostrils daily.  . [DISCONTINUED] traMADol (ULTRAM) 50 MG tablet Take 0.5 tablets (25 mg total) by mouth every 12 (twelve) hours as needed for moderate pain.   No  facility-administered encounter medications on file as of 08/26/2020.    ALLERGIES:  Allergies  Allergen Reactions  . Gabapentin Hives  . Hydrocodone-Acetaminophen Nausea Only  . Penicillins Other (See Comments)    Unknown child hood reaction .Did it involve swelling of the face/tongue/throat, SOB, or low BP? Unknown Did it involve sudden or severe rash/hives, skin peeling, or any reaction on the inside of your mouth or nose? Unknown Did you need to seek medical attention at a hospital or doctor's office? Unknown When did it last happen? If all above answers are "NO", may proceed with cephalosporin use.      PHYSICAL EXAM:  ECOG Performance status: 1  Vitals:   08/26/20 1008  BP: 113/62  Pulse: 81  Resp: 18  SpO2: 100%   Filed Weights   08/26/20 1008  Weight: 112 lb 10.5 oz (51.1 kg)    Physical Exam Constitutional:      Appearance: Normal appearance. She is normal weight.  Cardiovascular:     Rate and Rhythm: Normal rate and regular rhythm.     Heart sounds: Normal heart sounds.  Pulmonary:     Effort: Pulmonary effort is normal.     Breath sounds: Normal breath sounds.  Abdominal:     General: Bowel sounds are normal.     Palpations: Abdomen is soft.  Musculoskeletal:        General: Normal range of motion.  Skin:    General: Skin is warm and dry.  Neurological:     Mental Status: She is alert and oriented to person,  place, and time. Mental status is at baseline.  Psychiatric:        Mood and Affect: Mood normal.        Behavior: Behavior normal.        Thought Content: Thought content normal.        Judgment: Judgment normal.      LABORATORY DATA:  I have reviewed the labs as listed.  CBC    Component Value Date/Time   WBC 4.3 08/26/2020 0953   RBC 3.91 08/26/2020 0953   HGB 12.7 08/26/2020 0953   HGB 12.9 06/04/2018 0920   HGB 12.8 06/05/2013 1327   HCT 38.6 08/26/2020 0953   HCT 40.2 06/04/2018 0920   HCT 38.6 06/05/2013 1327   PLT 201 08/26/2020 0953   PLT 317 06/04/2018 0920   MCV 98.7 08/26/2020 0953   MCV 93 06/04/2018 0920   MCV 89.6 06/05/2013 1327   MCH 32.5 08/26/2020 0953   MCHC 32.9 08/26/2020 0953   RDW 15.9 (H) 08/26/2020 0953   RDW 14.4 06/04/2018 0920   RDW 14.4 06/05/2013 1327   LYMPHSABS 1.5 08/26/2020 0953   LYMPHSABS 1.8 06/04/2018 0920   LYMPHSABS 2.3 06/05/2013 1327   MONOABS 0.3 08/26/2020 0953   MONOABS 0.6 06/05/2013 1327   EOSABS 0.1 08/26/2020 0953   EOSABS 0.2 06/04/2018 0920   BASOSABS 0.0 08/26/2020 0953   BASOSABS 0.0 06/04/2018 0920   BASOSABS 0.1 06/05/2013 1327   CMP Latest Ref Rng & Units 08/26/2020 04/21/2020 01/14/2020  Glucose 70 - 99 mg/dL 79 87 93  BUN 6 - 20 mg/dL <5(L) <5(L) <5(L)  Creatinine 0.44 - 1.00 mg/dL 0.56 0.64 0.60  Sodium 135 - 145 mmol/L 137 138 136  Potassium 3.5 - 5.1 mmol/L 3.6 3.9 3.6  Chloride 98 - 111 mmol/L 104 104 102  CO2 22 - 32 mmol/L _0 Calcium 8.9 - 10.3 mg/dL 8.9 8.7(L) 8.6(L)  Total  Protein 6.5 - 8.1 g/dL 5.8(L) 6.5 6.6  Total Bilirubin 0.3 - 1.2 mg/dL 0.6 0.7 0.4  Alkaline Phos 38 - 126 U/L 55 64 64  AST 15 - 41 U/L _0 ALT 0 - 44 U/L _1 I personally performed a face-to-face visit.  All questions were answered to patient's stated satisfaction. Encouraged patient to call with any new concerns or questions before his next visit to the cancer center and we can certain see him  sooner, if needed.     ASSESSMENT & PLAN:   Polycythemia vera 1.  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 -Bone marrow biopsy was done on 07/12/2005 showed hypercellular marrow consistent with polycythemia. -She is a 30 pack-year active current smoker.  Does not have a history of thrombosis. -She reports her hot flashes have increased over the past 3 weeks which happens when she needs a phlebotomy.  She denies any itching after hot showers. -She has had a phlebotomy every 4 months since diagnosis.  Her symptoms improve after phlebotomies. -She continues to take aspirin 81 mg daily. -Labs from today are stable.  Hemoglobin is 12.7 with a hematocrit of 38.6.   She will not require a phlebotomy today. -RTC in 4 months for repeat labs and possible phlebotomy.  Patient will call clinic if she develops symptoms such as hot flashes to get lab work sooner.  2. Weight loss -Secondary to loss of husband. -States she does not have an appetite. -Given her smoking history, would recommend low-dose CT screening.  Patient qualifies.  Will refer to ldct screening program.  3. Joint/muscle pain secondary to PV ??? -Previously prescribed tramadol -Patient asking for refill. -Reviewed PDMP-okay to refill   Disposition- RTC in 4 months for labs, MD assessment and possible phlebotomy. Referral to low-dose CT screening program.       Orders placed this encounter:  Orders Placed This Encounter  Procedures  . Ambulatory Referral for Lung Cancer Screening      Faythe Casa, NP 08/26/2020 11:39 AM  [place] [phone]

## 2020-08-30 ENCOUNTER — Telehealth (INDEPENDENT_AMBULATORY_CARE_PROVIDER_SITE_OTHER): Payer: BC Managed Care – PPO | Admitting: Psychiatry

## 2020-08-30 ENCOUNTER — Telehealth (HOSPITAL_COMMUNITY): Payer: Self-pay | Admitting: *Deleted

## 2020-08-30 ENCOUNTER — Other Ambulatory Visit: Payer: Self-pay

## 2020-08-30 ENCOUNTER — Encounter (HOSPITAL_COMMUNITY): Payer: Self-pay | Admitting: Psychiatry

## 2020-08-30 DIAGNOSIS — F321 Major depressive disorder, single episode, moderate: Secondary | ICD-10-CM

## 2020-08-30 DIAGNOSIS — F429 Obsessive-compulsive disorder, unspecified: Secondary | ICD-10-CM

## 2020-08-30 MED ORDER — CLONAZEPAM 0.5 MG PO TABS: 0.5000 mg | ORAL_TABLET | Freq: Three times a day (TID) | ORAL | 2 refills | Status: DC | PRN

## 2020-08-30 MED ORDER — SERTRALINE HCL 100 MG PO TABS: ORAL_TABLET | ORAL | 3 refills | Status: DC

## 2020-08-30 MED ORDER — MIRTAZAPINE 15 MG PO TABS: 15.0000 mg | ORAL_TABLET | Freq: Every day | ORAL | 2 refills | Status: DC

## 2020-08-30 NOTE — Telephone Encounter (Signed)
Per pt she is calling due to provider told her that she can call office if her anxiety has gotten worse. Per pt she feels shaky inside, not really wanting to go out. Per pt she have a lot going on and she have discussed situation with provider. Pe rpt she would like to know what to do about her anxiety and if she needs to schedule a sooner appt then she's fine with that as well.

## 2020-08-30 NOTE — Progress Notes (Signed)
Virtual Visit via Video Note  I connected with Cheryl Cross on 08/30/20 at  3:40 PM EDT by a video enabled telemedicine application and verified that I am speaking with the correct person using two identifiers.  Location: Patient: home Provider: home   I discussed the limitations of evaluation and management by telemedicine and the availability of in person appointments. The patient expressed understanding and agreed to proceed.    I discussed the assessment and treatment plan with the patient. The patient was provided an opportunity to ask questions and all were answered. The patient agreed with the plan and demonstrated an understanding of the instructions.   The patient was advised to call back or seek an in-person evaluation if the symptoms worsen or if the condition fails to improve as anticipated.  I provided 30 minutes of non-face-to-face time during this encounter.   Cheryl Spiller, MD  [facility] MD/PA/NP OP Progress Note  08/30/2020 4:13 PM Cheryl Cross  MRN:  [id number]  Chief Complaint:  Chief Complaint    Depression; Anxiety; Follow-up     HPI: Patient is a 54 year old widowed female who lives alone in [place].  She is a Pharmacist, hospital at [employer].  The patient returns for follow-up as a work in today.  She was last seen 3 weeks ago.  She states over the last week or so she has become increasingly anxious to the point of feeling like something terrible is about to happen to her.  As she mentioned last time she has had significant visual problems, trouble with her eyes tracking and losing pieces of vision in any given time.  It has been very unpredictable.  She saw an ophthalmologist in [place] and had a recent brain MRI which was normal.  Now she is slated to see neurology but no appointment was available before December.  In the meantime is been very stressful for her to try to work with these visual problems going  on.  She is gotten so stressed and worried about this that she has not been able to go into work for the last 2 days.  She would like to go out on short-term disability until this can get resolved and I told her I had be happy to help her achieve this.  She is not eating or sleeping well and has lost over 30 pounds.  I will change her medicine at bedtime to mirtazapine to help more with sleep appetite and anxiety. Visit Diagnosis:    ICD-10-CM   1. Major depressive disorder, single episode, moderate (HCC)  F32.1 sertraline (ZOLOFT) 100 MG tablet  2. Obsessive-compulsive disorder, unspecified type  F42.9 sertraline (ZOLOFT) 100 MG tablet    Past Psychiatric History: Psychiatric hospitalization in 2013  Past Medical History:  Past Medical History:  Diagnosis Date  . Abdominal pain, right upper quadrant 06/10/2018  . Anxiety   . Arthritis   . Bursitis   . DDD (degenerative disc disease)   . Depression   . Fibromyalgia   . Obsessive-compulsive disorder   . Overactive bladder   . 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 PTSD (post-traumatic stress disorder)   . PVC's (premature ventricular contractions)    pt. placed on heart monitor x 24hours, everything fine   . Sesamoiditis February 2017    Past Surgical History:  Procedure Laterality Date  . ABDOMINAL HYSTERECTOMY    . ESOPHAGOGASTRODUODENOSCOPY N/A 09/10/2014   Procedure: ESOPHAGOGASTRODUODENOSCOPY (EGD);  Surgeon: Rogene Houston, MD;  [facility];  Service: Endoscopy;  Laterality: N/A;  130 - moved to 3:15 - Ann to notify  . LUMBAR FUSION    . TOOTH EXTRACTION Left Aug 2016    Family Psychiatric History: see below  Family History:  Family History  Problem Relation Age of Onset  . Cancer - Colon Mother        age 56  . Anxiety disorder Mother   . Atrial fibrillation Father   . Atrial fibrillation Brother   . Bipolar disorder Neg Hx   . Dementia Neg Hx   . Drug abuse Neg Hx   . Paranoid behavior Neg Hx    . Schizophrenia Neg Hx   . Seizures Neg Hx   . Sexual abuse Neg Hx   . Physical abuse Neg Hx     Social History:  Social History   Socioeconomic History  . Marital status: Married    Spouse name: Not on file  . Number of children: Not on file  . Years of education: Not on file  . Highest education level: Not on file  Occupational History  . Not on file  Tobacco Use  . Smoking status: Current Every Day Smoker    Packs/day: 1.00    Years: 15.00    Pack years: 15.00    Types: Cigarettes    Start date: 11/12/1996  . Smokeless tobacco: Never Used  . Tobacco comment: 1 pack a day x 20 yrs.  Vaping Use  . Vaping Use: Never used  Substance and Sexual Activity  . Alcohol use: No    Alcohol/week: 0.0 standard drinks  . Drug use: No    Types: Hydrocodone, Benzodiazepines  . Sexual activity: Not on file  Other Topics Concern  . Not on file  Social History Narrative  . Not on file   Social Determinants of Health   Financial Resource Strain:   . Difficulty of Paying Living Expenses: Not on file  Food Insecurity:   . Worried About Charity fundraiser in the Last Year: Not on file  . Ran Out of Food in the Last Year: Not on file  Transportation Needs:   . Lack of Transportation (Medical): Not on file  . Lack of Transportation (Non-Medical): Not on file  Physical Activity:   . Days of Exercise per Week: Not on file  . Minutes of Exercise per Session: Not on file  Stress:   . Feeling of Stress : Not on file  Social Connections:   . Frequency of Communication with Friends and Family: Not on file  . Frequency of Social Gatherings with Friends and Family: Not on file  . Attends Religious Services: Not on file  . Active Member of Clubs or Organizations: Not on file  . Attends Archivist Meetings: Not on file  . Marital Status: Not on file    Allergies:  Allergies  Allergen Reactions  . Gabapentin Hives  . Hydrocodone-Acetaminophen Nausea Only  . Penicillins  Other (See Comments)    Unknown child hood reaction .Did it involve swelling of the face/tongue/throat, SOB, or low BP? Unknown Did it involve sudden or severe rash/hives, skin peeling, or any reaction on the inside of your mouth or nose? Unknown Did you need to seek medical attention at a hospital or doctor's office? Unknown When did it last happen? If all above answers are "NO", may proceed with cephalosporin use.     Metabolic Disorder Labs: No results found for: HGBA1C, MPG No results  found for: PROLACTIN No results found for: CHOL, TRIG, HDL, CHOLHDL, VLDL, LDLCALC Lab Results  Component Value Date   TSH 1.490 05/28/2016   TSH 0.896 03/11/2012    Therapeutic Level Labs: No results found for: LITHIUM No results found for: VALPROATE No components found for:  CBMZ  Current Medications: Current Outpatient Medications  Medication Sig Dispense Refill  . aspirin 81 MG tablet Take 1 tablet (81 mg total) by mouth daily. For Polycythemia Vera.    . clonazePAM (KLONOPIN) 0.5 MG tablet Take 1 tablet (0.5 mg total) by mouth 3 (three) times daily as needed for anxiety. 90 tablet 2  . mirtazapine (REMERON) 15 MG tablet Take 1 tablet (15 mg total) by mouth at bedtime. 30 tablet 2  . sertraline (ZOLOFT) 100 MG tablet TAKE 1 AND 1/2 TABLETS(150 MG) BY MOUTH DAILY 45 tablet 3  . traMADol (ULTRAM) 50 MG tablet Take 0.5 tablets (25 mg total) by mouth every 12 (twelve) hours as needed for moderate pain. 30 tablet 0   No current facility-administered medications for this visit.     Musculoskeletal: Strength & Muscle Tone: within normal limits Gait & Station: normal Patient leans: N/A  Psychiatric Specialty Exam: Review of Systems  Constitutional: Positive for appetite change, fatigue and unexpected weight change.  Eyes: Positive for visual disturbance.  Psychiatric/Behavioral: Positive for dysphoric mood. The patient is nervous/anxious.     There were no vitals taken for this  visit.There is no height or weight on file to calculate BMI.  General Appearance: Casual and Fairly Groomed  Eye Contact:  Good  Speech:  Clear and Coherent  Volume:  Normal  Mood:  Anxious  Affect:  Constricted and Flat  Thought Process:  Goal Directed  Orientation:  Full (Time, Place, and Person)  Thought Content: Rumination   Suicidal Thoughts:  No  Homicidal Thoughts:  No  Memory:  Immediate;   Good Recent;   Good Remote;   Good  Judgement:  Good  Insight:  Good  Psychomotor Activity:  Decreased  Concentration:  Concentration: Fair and Attention Span: Fair  Recall:  Good  Fund of Knowledge: Good  Language: Good  Akathisia:  No  Handed:  Right  AIMS (if indicated): not done  Assets:  Communication Skills Desire for Improvement Resilience Social Support Talents/Skills  ADL's:  Intact  Cognition: WNL  Sleep:  Fair   Screenings: PHQ2-9     Office Visit from 06/10/2018 in [place] Office Visit from 06/04/2017 in [place] Office Visit from 05/28/2016 in [place] Office Visit from 07/04/2015 in [place] Office Visit from 06/07/2014 in [place]  PHQ-2 Total Score 1 1 0 0 0       Assessment and Plan: This patient is a 54 year old female with a history of substance abuse in remission depression difficulty sleeping and a very disturbing visual problem that has been unexplained so far.  This is caused severe stress for her and she has not been able to face going into work.  I do agree with going on short-term disability until this can get figured out I will be happy to fill out forms for her.  She will continue Zoloft 150 mg daily for depression.  She will discontinue trazodone and start mirtazapine 15 mg at bedtime for sleep and appetite and continue clonazepam 0.5 mg but increase the frequency to up to 3 times daily as needed for anxiety.  She will  return to see me in 3 weeks   Cheryl Spiller, MD 08/30/2020, 4:13 PM

## 2020-08-30 NOTE — Telephone Encounter (Signed)
Yes, please schedule her

## 2020-08-30 NOTE — Telephone Encounter (Signed)
Spoke with patient and informed her with information and an appt was made for today 08-30-2020

## 2020-09-01 ENCOUNTER — Encounter (HOSPITAL_COMMUNITY): Payer: Self-pay | Admitting: Oncology

## 2020-09-02 ENCOUNTER — Other Ambulatory Visit (HOSPITAL_COMMUNITY): Payer: Self-pay | Admitting: *Deleted

## 2020-09-05 ENCOUNTER — Other Ambulatory Visit (HOSPITAL_COMMUNITY): Payer: Self-pay | Admitting: *Deleted

## 2020-09-05 MED ORDER — TRAMADOL HCL 50 MG PO TABS: 25.0000 mg | ORAL_TABLET | Freq: Two times a day (BID) | ORAL | 0 refills | Status: DC | PRN

## 2020-09-06 ENCOUNTER — Telehealth (HOSPITAL_COMMUNITY): Payer: Self-pay | Admitting: *Deleted

## 2020-09-06 NOTE — Telephone Encounter (Signed)
Faxed dropped off paperwork to Leave of Absence Coordinator with [employer]

## 2020-09-22 ENCOUNTER — Encounter (HOSPITAL_COMMUNITY): Payer: Self-pay | Admitting: *Deleted

## 2020-09-22 ENCOUNTER — Telehealth (INDEPENDENT_AMBULATORY_CARE_PROVIDER_SITE_OTHER): Payer: BC Managed Care – PPO | Admitting: Psychiatry

## 2020-09-22 ENCOUNTER — Encounter (HOSPITAL_COMMUNITY): Payer: Self-pay | Admitting: Psychiatry

## 2020-09-22 ENCOUNTER — Other Ambulatory Visit: Payer: Self-pay

## 2020-09-22 DIAGNOSIS — F429 Obsessive-compulsive disorder, unspecified: Secondary | ICD-10-CM

## 2020-09-22 DIAGNOSIS — F321 Major depressive disorder, single episode, moderate: Secondary | ICD-10-CM | POA: Diagnosis not present

## 2020-09-22 MED ORDER — CLONAZEPAM 0.5 MG PO TABS: 0.5000 mg | ORAL_TABLET | Freq: Three times a day (TID) | ORAL | 2 refills | Status: DC | PRN

## 2020-09-22 MED ORDER — SERTRALINE HCL 100 MG PO TABS: ORAL_TABLET | ORAL | 3 refills | Status: DC

## 2020-09-22 MED ORDER — MIRTAZAPINE 15 MG PO TABS
15.0000 mg | ORAL_TABLET | Freq: Every day | ORAL | 2 refills | Status: DC
Start: 2020-09-22 — End: 2020-10-19

## 2020-09-22 NOTE — Progress Notes (Signed)
Virtual Visit via Video Note  I connected with Cheryl Cross on 09/22/20 at  1:00 PM EST by a video enabled telemedicine application and verified that I am speaking with the correct person using two identifiers.  Location: Patient: home Provider: office   I discussed the limitations of evaluation and management by telemedicine and the availability of in person appointments. The patient expressed understanding and agreed to proceed    I discussed the assessment and treatment plan with the patient. The patient was provided an opportunity to ask questions and all were answered. The patient agreed with the plan and demonstrated an understanding of the instructions.   The patient was advised to call back or seek an in-person evaluation if the symptoms worsen or if the condition fails to improve as anticipated.  I provided 15 minutes of non-face-to-face time during this encounter.   [name], MD  [facility] MD/PA/NP OP Progress Note  09/22/2020 1:23 PM Cheryl Cross  MRN:  [id number]  Chief Complaint:  Chief Complaint    Depression; Anxiety; Follow-up     HPI: This patient is a 54 year old widowed female who lives alone in [place].  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  The patient returns for follow-up today after about 3 weeks.  Last time she was not doing well.  She has become increasingly anxious and unable to function.  She had had significant visual problems at the ophthalmologist was not able to diagnose.  She still having spots of vision that go blank at times.  She has had a normal MRI of the brain but is slated to see a neurologist.  She is currently on medical leave.  I have changed her nighttime medicine from trazodone to mirtazapine and she is eating better and still sleeping fairly well.  She is having a lot of anxiety and worry about her medical issues and worried about whether or not she will be  able to work in the future.  I asked her about counseling but she states that she is managing okay from the methods of self-care that she learned through hospice counseling.  She denies suicidal ideation Visit Diagnosis:    ICD-10-CM   1. Major depressive disorder, single episode, moderate (HCC)  F32.1 sertraline (ZOLOFT) 100 MG tablet  2. Obsessive-compulsive disorder, unspecified type  F42.9 sertraline (ZOLOFT) 100 MG tablet    Past Psychiatric History: Psychiatric hospitalization in 2013  Past Medical History:  Past Medical History:  Diagnosis Date  . Abdominal pain, right upper quadrant 06/10/2018  . Anxiety   . Arthritis   . Bursitis   . DDD (degenerative disc disease)   . Depression   . Fibromyalgia   . Obsessive-compulsive disorder   . Overactive bladder   . 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 PTSD (post-traumatic stress disorder)   . PVC's (premature ventricular contractions)    pt. placed on heart monitor x 24hours, everything fine   . Sesamoiditis February 2017    Past Surgical History:  Procedure Laterality Date  . ABDOMINAL HYSTERECTOMY    . ESOPHAGOGASTRODUODENOSCOPY N/A 09/10/2014   Procedure: ESOPHAGOGASTRODUODENOSCOPY (EGD);  Surgeon: Rogene Houston, MD;  [facility];  Service: Endoscopy;  Laterality: N/A;  130 - moved to 3:15 - Ann to notify  . LUMBAR FUSION    . TOOTH EXTRACTION Left Aug 2016    Family Psychiatric History: see below  Family History:  Family History  Problem  Relation Age of Onset  . Cancer - Colon Mother        age 37  . Anxiety disorder Mother   . Atrial fibrillation Father   . Atrial fibrillation Brother   . Bipolar disorder Neg Hx   . Dementia Neg Hx   . Drug abuse Neg Hx   . Paranoid behavior Neg Hx   . Schizophrenia Neg Hx   . Seizures Neg Hx   . Sexual abuse Neg Hx   . Physical abuse Neg Hx     Social History:  Social History   Socioeconomic History  . Marital status: Married    Spouse name: Not on  file  . Number of children: Not on file  . Years of education: Not on file  . Highest education level: Not on file  Occupational History  . Not on file  Tobacco Use  . Smoking status: Current Every Day Smoker    Packs/day: 1.00    Years: 15.00    Pack years: 15.00    Types: Cigarettes    Start date: 11/12/1996  . Smokeless tobacco: Never Used  . Tobacco comment: 1 pack a day x 20 yrs.  Vaping Use  . Vaping Use: Never used  Substance and Sexual Activity  . Alcohol use: No    Alcohol/week: 0.0 standard drinks  . Drug use: No    Types: Hydrocodone, Benzodiazepines  . Sexual activity: Not on file  Other Topics Concern  . Not on file  Social History Narrative  . Not on file   Social Determinants of Health   Financial Resource Strain:   . Difficulty of Paying Living Expenses: Not on file  Food Insecurity:   . Worried About Charity fundraiser in the Last Year: Not on file  . Ran Out of Food in the Last Year: Not on file  Transportation Needs:   . Lack of Transportation (Medical): Not on file  . Lack of Transportation (Non-Medical): Not on file  Physical Activity:   . Days of Exercise per Week: Not on file  . Minutes of Exercise per Session: Not on file  Stress:   . Feeling of Stress : Not on file  Social Connections:   . Frequency of Communication with Friends and Family: Not on file  . Frequency of Social Gatherings with Friends and Family: Not on file  . Attends Religious Services: Not on file  . Active Member of Clubs or Organizations: Not on file  . Attends Archivist Meetings: Not on file  . Marital Status: Not on file    Allergies:  Allergies  Allergen Reactions  . Gabapentin Hives  . Hydrocodone-Acetaminophen Nausea Only  . Penicillins Other (See Comments)    Unknown child hood reaction .Did it involve swelling of the face/tongue/throat, SOB, or low BP? Unknown Did it involve sudden or severe rash/hives, skin peeling, or any reaction on the inside  of your mouth or nose? Unknown Did you need to seek medical attention at a hospital or doctor's office? Unknown When did it last happen? If all above answers are "NO", may proceed with cephalosporin use.     Metabolic Disorder Labs: No results found for: HGBA1C, MPG No results found for: PROLACTIN No results found for: CHOL, TRIG, HDL, CHOLHDL, VLDL, LDLCALC Lab Results  Component Value Date   TSH 1.490 05/28/2016   TSH 0.896 03/11/2012    Therapeutic Level Labs: No results found for: LITHIUM No results found for: VALPROATE No components found  for:  CBMZ  Current Medications: Current Outpatient Medications  Medication Sig Dispense Refill  . aspirin 81 MG tablet Take 1 tablet (81 mg total) by mouth daily. For Polycythemia Vera.    . clonazePAM (KLONOPIN) 0.5 MG tablet Take 1 tablet (0.5 mg total) by mouth 3 (three) times daily as needed for anxiety. 90 tablet 2  . mirtazapine (REMERON) 15 MG tablet Take 1 tablet (15 mg total) by mouth at bedtime. 30 tablet 2  . sertraline (ZOLOFT) 100 MG tablet TAKE 1 AND 1/2 TABLETS(150 MG) BY MOUTH DAILY 45 tablet 3  . traMADol (ULTRAM) 50 MG tablet Take 0.5 tablets (25 mg total) by mouth every 12 (twelve) hours as needed for moderate pain. 30 tablet 0   No current facility-administered medications for this visit.     Musculoskeletal: Strength & Muscle Tone: within normal limits Gait & Station: normal Patient leans: N/A  Psychiatric Specialty Exam: Review of Systems  Eyes: Positive for visual disturbance.  Psychiatric/Behavioral: The patient is nervous/anxious.   All other systems reviewed and are negative.   There were no vitals taken for this visit.There is no height or weight on file to calculate BMI.  General Appearance: Casual and Fairly Groomed  Eye Contact:  Good  Speech:  Clear and Coherent  Volume:  Normal  Mood:  Anxious and Dysphoric  Affect:  Constricted  Thought Process:  Goal Directed  Orientation:  Full  (Time, Place, and Person)  Thought Content: Rumination   Suicidal Thoughts:  No  Homicidal Thoughts:  No  Memory:  Immediate;   Good Recent;   Good Remote;   Good  Judgement:  Good  Insight:  Good  Psychomotor Activity:  Decreased  Concentration:  Concentration: Fair and Attention Span: Fair  Recall:  Good  Fund of Knowledge: Good  Language: Good  Akathisia:  No  Handed:  Right  AIMS (if indicated): not done  Assets:  Communication Skills Desire for Improvement Resilience Social Support Talents/Skills  ADL's:  Intact  Cognition: WNL  Sleep:  Good   Screenings: PHQ2-9     Office Visit from 06/10/2018 in [place] Office Visit from 06/04/2017 in [place] Office Visit from 05/28/2016 in [place] Office Visit from 07/04/2015 in [place] Visit from 06/07/2014 in [place]  PHQ-2 Total Score 1 1 0 0 0       Assessment and Plan: This patient is a 54 year old female with a history of substance abuse in remission, depression insomnia and a very disturbing visual problem that has been so far unexplained.  This is caused severe stress for her.  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  She is also eating and sleeping better with mirtazapine 15 mg at bedtime and this will be continued.  She will also continue clonazepam 0.5 mg up to 3 times daily for anxiety and Zoloft 150 mg daily for depression.  She will return to see me in 4 weeks   [name], MD 09/22/2020, 1:23 PM

## 2020-09-29 ENCOUNTER — Other Ambulatory Visit (HOSPITAL_COMMUNITY): Payer: BC Managed Care – PPO

## 2020-09-29 ENCOUNTER — Ambulatory Visit (HOSPITAL_COMMUNITY): Payer: BC Managed Care – PPO | Admitting: Nurse Practitioner

## 2020-09-29 ENCOUNTER — Encounter (HOSPITAL_COMMUNITY): Payer: BC Managed Care – PPO

## 2020-10-13 ENCOUNTER — Other Ambulatory Visit: Payer: Self-pay

## 2020-10-13 ENCOUNTER — Ambulatory Visit: Payer: BC Managed Care – PPO | Admitting: Neurology

## 2020-10-13 ENCOUNTER — Encounter: Payer: Self-pay | Admitting: Neurology

## 2020-10-13 VITALS — BP 121/79 | HR 85 | Ht 67.0 in | Wt 112.5 lb

## 2020-10-13 DIAGNOSIS — H539 Unspecified visual disturbance: Secondary | ICD-10-CM | POA: Diagnosis not present

## 2020-10-13 DIAGNOSIS — H3553 Other dystrophies primarily involving the sensory retina: Secondary | ICD-10-CM

## 2020-10-13 DIAGNOSIS — F418 Other specified anxiety disorders: Secondary | ICD-10-CM | POA: Diagnosis not present

## 2020-10-13 DIAGNOSIS — G629 Polyneuropathy, unspecified: Secondary | ICD-10-CM | POA: Insufficient documentation

## 2020-10-13 NOTE — Progress Notes (Signed)
[facility]  PATIENT: Cheryl Cross DOB: 1966/05/12  REFERRING DOCTOR OR PCP: Melissa Noon, OD SOURCE: Patient, notes from optometry, imaging reports, MRI images personally reviewed.  _________________________________   HISTORICAL  CHIEF COMPLAINT:  Chief Complaint  Patient presents with  . New Patient (Initial Visit)    RM 12, alone. 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     HISTORY OF PRESENT ILLNESS:  I had the pleasure of seeing patient, Cheryl Cross, at [facility] for neurologic consultation regarding her visual changes.  She is a 54 year old woman who began to experience haziness and blind spots in her vision for a couple years.   This has progressed.    Her husband used to do all the driving but passed away recently and she noted more difficulty with her vision while driving.    She notes distortion in her vision while reading.   She notes monocular vision is less blurry rthan binocular vision.  Depth perception is poor.   She has more difficulty with reading.      She saw Dr. Delman Cheadle recenty for exam and she was 20/60 OD and 20/80-2 OS.   Tonometry was normal.   Eye exam showed normal conjunctiva, corneas, anterior chambers, lenses.   She had mild Stargardt macular degeneration changes but nothing that should explain severe visual defects.   VF testing showed defects that were nonspecific/global bilaterally.    Some visual tracking defects were noted.     She also has had some numbness in her feet and up her legs to the knees at times.     She gets a swollen sensation in her legs at time.s   She has some neck pain.      She has depression and anxiety.  This worsened with her husbands death and then worsened again with her father's cancer diagnosis/illness.   She tries not to leave her house as she feels less secure outside the home.   She has lost some  weight recently.   Appetite improved with mirtazepine.   She is up to 112 from 108 pounds.    She takes occasional tramadol (<1 a day) for the neck pain.  MRI of the brain was personally reviewed.  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  There were no acute findings.  REVIEW OF SYSTEMS: Constitutional: No fevers, chills, sweats, or change in appetite Eyes: No visual changes, double vision, eye pain Ear, nose and throat: No hearing loss, ear pain, nasal congestion, sore throat Cardiovascular: No chest pain, palpitations Respiratory: No shortness of breath at rest or with exertion.   No wheezes GastrointestinaI: No nausea, vomiting, diarrhea, abdominal pain, fecal incontinence Genitourinary: No dysuria, urinary retention or frequency.  No nocturia. Musculoskeletal: No neck pain, back pain Integumentary: No rash, pruritus, skin lesions Neurological: as above Psychiatric: No depression at this time.  No anxiety Endocrine: No palpitations, diaphoresis, change in appetite, change in weigh or increased thirst Hematologic/Lymphatic: No anemia, purpura, petechiae. Allergic/Immunologic: No itchy/runny eyes, nasal congestion, recent allergic reactions, rashes  ALLERGIES: Allergies  Allergen Reactions  . Gabapentin Hives  . Hydrocodone-Acetaminophen Nausea Only  . Penicillins Other (See Comments)    Unknown child hood reaction .Did it involve swelling of the face/tongue/throat, SOB, or low BP? Unknown  Did it involve sudden or severe rash/hives, skin peeling, or any reaction on the inside of your mouth or nose? Unknown Did you need to seek medical attention at a hospital or doctor's office? Unknown When did it last happen? If all above answers are "NO", may proceed with cephalosporin use.     HOME MEDICATIONS:  Current Outpatient  Medications:  .  aspirin 81 MG tablet, Take 1 tablet (81 mg total) by mouth daily. For Polycythemia Vera., Disp: , Rfl:  .  clonazePAM (KLONOPIN) 0.5 MG tablet, Take 1 tablet (0.5 mg total) by mouth 3 (three) times daily as needed for anxiety., Disp: 90 tablet, Rfl: 2 .  mirtazapine (REMERON) 15 MG tablet, Take 1 tablet (15 mg total) by mouth at bedtime., Disp: 30 tablet, Rfl: 2 .  sertraline (ZOLOFT) 100 MG tablet, TAKE 1 AND 1/2 TABLETS(150 MG) BY MOUTH DAILY, Disp: 45 tablet, Rfl: 3 .  traMADol (ULTRAM) 50 MG tablet, Take 0.5 tablets (25 mg total) by mouth every 12 (twelve) hours as needed for moderate pain., Disp: 30 tablet, Rfl: 0  PAST MEDICAL HISTORY: Past Medical History:  Diagnosis Date  . Abdominal pain, right upper quadrant 06/10/2018  . Anxiety   . Arthritis   . Bursitis   . DDD (degenerative disc disease)   . Depression   . Fibromyalgia   . Obsessive-compulsive disorder   . Overactive bladder   . 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 PTSD (post-traumatic stress disorder)   . PVC's (premature ventricular contractions)    pt. placed on heart monitor x 24hours, everything fine   . Sesamoiditis February 2017    PAST SURGICAL HISTORY: Past Surgical History:  Procedure Laterality Date  . ABDOMINAL HYSTERECTOMY    . ESOPHAGOGASTRODUODENOSCOPY N/A 09/10/2014   Procedure: ESOPHAGOGASTRODUODENOSCOPY (EGD);  Surgeon: Rogene Houston, MD;  [facility];  Service: Endoscopy;  Laterality: N/A;  130 - moved to 3:15 - Ann to notify  . LUMBAR FUSION    . TOOTH EXTRACTION Left Aug 2016    FAMILY HISTORY: Family History  Problem Relation Age of Onset  . Cancer - Colon Mother        age 16  . Anxiety disorder Mother   . Diabetes Mother   . Atrial fibrillation Father   . Lung cancer Father   . Atrial fibrillation Brother   . Bipolar disorder Neg Hx   . Dementia Neg Hx   . Drug abuse Neg Hx   . Paranoid behavior Neg Hx   . Schizophrenia Neg Hx   . Seizures Neg  Hx   . Sexual abuse Neg Hx   . Physical abuse Neg Hx     SOCIAL HISTORY:  Social History   Socioeconomic History  . Marital status: Widowed    Spouse name: Not on file  . Number of children: 0  . Years of education: BA  . Highest education level: Not on file  Occupational History  . Occupation: [employer]- on leave (since 08/2020)  Tobacco Use  . Smoking status: Current Every Day Smoker    Packs/day: 1.00    Years: 15.00    Pack years: 15.00    Types: Cigarettes    Start date: 11/12/1996  . Smokeless tobacco: Never Used  . Tobacco comment: 1 pack a day x 20 yrs.  Vaping Use  . Vaping Use: Never used  Substance and Sexual Activity  . Alcohol use: No    Alcohol/week: 0.0 standard drinks  . Drug  use: No    Types: Hydrocodone, Benzodiazepines  . Sexual activity: Not on file  Other Topics Concern  . Not on file  Social History Narrative   Right handed   2-3 glasses soda per day   Lives alone   Social Determinants of Health   Financial Resource Strain:   . Difficulty of Paying Living Expenses: Not on file  Food Insecurity:   . Worried About Charity fundraiser in the Last Year: Not on file  . Ran Out of Food in the Last Year: Not on file  Transportation Needs:   . Lack of Transportation (Medical): Not on file  . Lack of Transportation (Non-Medical): Not on file  Physical Activity:   . Days of Exercise per Week: Not on file  . Minutes of Exercise per Session: Not on file  Stress:   . Feeling of Stress : Not on file  Social Connections:   . Frequency of Communication with Friends and Family: Not on file  . Frequency of Social Gatherings with Friends and Family: Not on file  . Attends Religious Services: Not on file  . Active Member of Clubs or Organizations: Not on file  . Attends Archivist Meetings: Not on file  . Marital Status: Not on file  Intimate Partner Violence:   . Fear of Current or Ex-Partner: Not on file  . Emotionally  Abused: Not on file  . Physically Abused: Not on file  . Sexually Abused: Not on file     PHYSICAL EXAM  Vitals:   10/13/20 1005  BP: 121/79  Pulse: 85  SpO2: 96%  Weight: 112 lb 8 oz (51 kg)  Height: '5\' 7"'  (1.702 m)    Body mass index is 17.62 kg/m.   General: The patient is well-developed and well-nourished and in no acute distress  HEENT:  Head is [place]/AT.  Sclera are anicteric.  Funduscopic exam shows normal optic discs and retinal vessels.  Neck: No carotid bruits are noted.  The neck is nontender with good range of motion  Cardiovascular: The heart has a regular rate and rhythm with a normal S1 and S2. There were no murmurs, gallops or rubs.    Skin: Extremities are without rash or  edema.  Musculoskeletal:  Back is nontender  Neurologic Exam  Mental status: The patient is alert and oriented x 3 at the time of the examination. The patient has apparent normal recent and remote memory, with an apparently normal attention span and concentration ability.   Speech is normal.  Cranial nerves: Extraocular movements are full. Pupils are equal, round, and reactive to light and accomodation.  Visual fields are full.  Facial symmetry is present. There is good facial sensation to soft touch bilaterally.Facial strength is normal.  Trapezius and sternocleidomastoid strength is normal. No dysarthria is noted.  The tongue is midline, and the patient has symmetric elevation of the soft palate. No obvious hearing deficits are noted.  Motor:  Muscle bulk is normal.   Tone is normal. Strength is  5 / 5 in all 4 extremities.   Sensory: Sensory testing is intact to pinprick, soft touch and vibration sensation in the arms but reduced vibration sensation (50% at toes and 75% at ankles) and reduced pinprick sensation at the toes  Coordination: Cerebellar testing reveals good finger-nose-finger and heel-to-shin bilaterally.  Gait and station: Station is normal.   Gait is normal. Tandem gait is  slightly wide. Romberg is negative.   Reflexes: Deep tendon reflexes  are symmetric and normal bilaterally.   Plantar responses are flexor.    DIAGNOSTIC DATA (LABS, IMAGING, TESTING) - I reviewed patient records, labs, notes, testing and imaging myself where available.  Lab Results  Component Value Date   WBC 4.3 08/26/2020   HGB 12.7 08/26/2020   HCT 38.6 08/26/2020   MCV 98.7 08/26/2020   PLT 201 08/26/2020      Component Value Date/Time   NA 137 08/26/2020 0953   NA 143 06/04/2018 0920   NA 139 06/05/2013 1327   K 3.6 08/26/2020 0953   K 4.0 06/05/2013 1327   CL 104 08/26/2020 0953   CO2 29 08/26/2020 0953   CO2 26 06/05/2013 1327   GLUCOSE 79 08/26/2020 0953   GLUCOSE 98 06/05/2013 1327   BUN <5 (L) 08/26/2020 0953   BUN 3 (L) 06/04/2018 0920   BUN 4.2 (L) 06/05/2013 1327   CREATININE 0.56 08/26/2020 0953   CREATININE 0.57 05/31/2014 1000   CREATININE 0.8 06/05/2013 1327   CALCIUM 8.9 08/26/2020 0953   CALCIUM 9.2 06/05/2013 1327   PROT 5.8 (L) 08/26/2020 0953   PROT 6.2 06/04/2018 0920   PROT 6.7 06/05/2013 1327   ALBUMIN 3.5 08/26/2020 0953   ALBUMIN 4.1 06/04/2018 0920   ALBUMIN 3.8 06/05/2013 1327   AST 20 08/26/2020 0953   AST 7 06/05/2013 1327   ALT 17 08/26/2020 0953   ALT <6 Repeated and Verified 06/05/2013 1327   ALKPHOS 55 08/26/2020 0953   ALKPHOS 63 06/05/2013 1327   BILITOT 0.6 08/26/2020 0953   BILITOT <0.2 06/04/2018 0920   BILITOT 0.29 06/05/2013 1327   GFRNONAA >60 08/26/2020 0953   GFRNONAA >89 05/31/2014 1000   GFRAA >60 04/21/2020 1258   GFRAA >89 05/31/2014 1000   No results found for: CHOL, HDL, LDLCALC, LDLDIRECT, TRIG, CHOLHDL No results found for: HGBA1C Lab Results  Component Value Date   [id number] 341 06/13/2010   Lab Results  Component Value Date   TSH 1.490 05/28/2016       ASSESSMENT AND PLAN  Polyneuropathy - Plan: Multiple Myeloma Panel (SPEP&IFE w/QIG), Vitamin B12, Copper, serum, TSH, Vitamin A  Vision  disturbance - Plan: Multiple Myeloma Panel (SPEP&IFE w/QIG), Vitamin B12, Copper, serum, TSH, Vitamin A  Stargardt's disease  Depression with anxiety   In summary, Ms. Creek is a 54 year old woman with a several year history of progressive visual decline and diplopia.  Her neurologic examination showed some numbness to vibration and pinprick in the feet but was otherwise unremarkable.  The MRI of the brain was normal for age so the etiology of her visual changes is uncertain.  I will check some lab work to further evaluate the numbness and visual changes.  She has had weight loss over the last couple years so a nutritional deficiency needs to be ruled out.  Follow-up will be scheduled if needed based on the results of the studies.  She should call if she has new or worsening neurologic symptoms.  Thank you for asking to see Ms. Corey.  Please let me know but if further assistance with her or other patients in the future.   Tawana Pasch A. Felecia Shelling, MD, [facility] 96/12/2295, 98:92 AM Certified in Neurology, Clinical Neurophysiology, Sleep Medicine and Neuroimaging  [facility] [address] [place], [place] [address] [phone]

## 2020-10-17 ENCOUNTER — Telehealth: Payer: Self-pay | Admitting: *Deleted

## 2020-10-17 LAB — COPPER, SERUM: Copper: 112 ug/dL (ref 80–158)

## 2020-10-17 LAB — MULTIPLE MYELOMA PANEL, SERUM
Albumin SerPl Elph-Mcnc: 3.5 g/dL (ref 2.9–4.4)
Albumin/Glob SerPl: 1.3 (ref 0.7–1.7)
Alpha 1: 0.3 g/dL (ref 0.0–0.4)
Alpha2 Glob SerPl Elph-Mcnc: 0.7 g/dL (ref 0.4–1.0)
B-Globulin SerPl Elph-Mcnc: 0.9 g/dL (ref 0.7–1.3)
Gamma Glob SerPl Elph-Mcnc: 0.9 g/dL (ref 0.4–1.8)
Globulin, Total: 2.9 g/dL (ref 2.2–3.9)
IgA/Immunoglobulin A, Serum: 179 mg/dL (ref 87–352)
IgG (Immunoglobin G), Serum: 843 mg/dL (ref 586–1602)
IgM (Immunoglobulin M), Srm: 113 mg/dL (ref 26–217)
Total Protein: 6.4 g/dL (ref 6.0–8.5)

## 2020-10-17 LAB — VITAMIN A: Vitamin A: 19.7 ug/dL — ABNORMAL LOW (ref 20.1–62.0)

## 2020-10-17 LAB — VITAMIN B12: Vitamin B-12: 188 pg/mL — ABNORMAL LOW (ref 232–1245)

## 2020-10-17 LAB — TSH: TSH: 1.77 u[IU]/mL (ref 0.450–4.500)

## 2020-10-17 NOTE — Telephone Encounter (Signed)
-----   Message from Britt Bottom, MD sent at 10/17/2020  4:42 PM EST ----- I did not scheduled a follow-up visit for her. I would like to see her back in 2 to 3 months.  The last blood test came back and was fine.  Please send her the following message: Both vitamin B12 and the vitamin A are mildly low. I would like you to take vitamin B12 tablets 1000 mcg daily and vitamin A 2400 mcg daily.   We will recheck the vitamin levels when you come back in a couple months    Thank you

## 2020-10-18 NOTE — Telephone Encounter (Signed)
Dr. Sater- please advise 

## 2020-10-18 NOTE — Telephone Encounter (Signed)
Since her vitamin levels were just mildly low, they are probably not causing the visual issues and she should continue to follow-up with ophthalmology.

## 2020-10-18 NOTE — Telephone Encounter (Signed)
Patient called wanting to know if Dr. Felecia Shelling thinks that low vitamin levels could be causing vision changes, or if she should return to her Ophthalmologist to check for something else causing this. Please advise. I told patient that she may receive a call or a message on MyChart. FYI

## 2020-10-19 ENCOUNTER — Telehealth (INDEPENDENT_AMBULATORY_CARE_PROVIDER_SITE_OTHER): Payer: BC Managed Care – PPO | Admitting: Psychiatry

## 2020-10-19 ENCOUNTER — Other Ambulatory Visit: Payer: Self-pay

## 2020-10-19 ENCOUNTER — Encounter (HOSPITAL_COMMUNITY): Payer: Self-pay | Admitting: Psychiatry

## 2020-10-19 DIAGNOSIS — F321 Major depressive disorder, single episode, moderate: Secondary | ICD-10-CM | POA: Diagnosis not present

## 2020-10-19 DIAGNOSIS — F429 Obsessive-compulsive disorder, unspecified: Secondary | ICD-10-CM | POA: Diagnosis not present

## 2020-10-19 MED ORDER — SERTRALINE HCL 100 MG PO TABS: ORAL_TABLET | ORAL | 3 refills | Status: DC

## 2020-10-19 MED ORDER — MIRTAZAPINE 15 MG PO TABS
15.0000 mg | ORAL_TABLET | Freq: Every day | ORAL | 2 refills | Status: DC
Start: 2020-10-19 — End: 2020-11-15

## 2020-10-19 MED ORDER — CLONAZEPAM 0.5 MG PO TABS: 0.5000 mg | ORAL_TABLET | Freq: Three times a day (TID) | ORAL | 2 refills | Status: DC | PRN

## 2020-10-19 NOTE — Progress Notes (Signed)
Virtual Visit via Video Note  I connected with Cheryl Cross on 10/19/20 at  1:20 PM EST by a video enabled telemedicine application and verified that I am speaking with the correct person using two identifiers.  Location: Patient: home Provider: home   I discussed the limitations of evaluation and management by telemedicine and the availability of in person appointments. The patient expressed understanding and agreed to proceed.   I discussed the assessment and treatment plan with the patient. The patient was provided an opportunity to ask questions and all were answered. The patient agreed with the plan and demonstrated an understanding of the instructions.   The patient was advised to call back or seek an in-person evaluation if the symptoms worsen or if the condition fails to improve as anticipated.  I provided 15 minutes of non-face-to-face time during this encounter.   Cheryl Spiller, MD  [name] MD/PA/NP OP Progress Note  10/19/2020 1:54 PM Cheryl Cross  MRN:  [id number]  Chief Complaint:  Chief Complaint    Depression; Anxiety; Follow-up     HPI: This patient is a 54 year old widowed white female who lives alone in [place].  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  The patient returns for follow-up after 6 weeks.  She states that she is still having significant visual problems with tracking and seeing double.  She has been to a neurologist and nothing specific was found.  Her brain MRI was negative.  The ophthalmologist is possibly going to refer her to another specialist.  The patient remains on medical leave.  Today is a difficult day for her because at the 1 year anniversary of her husband's death.  She is trying to eat more and is sleeping well with the medications she is not suicidal.  She is very frustrated with her eye issues and is anxious about having to return to work but I told her we would  take 1 step at a time. Visit Diagnosis:    ICD-10-CM   1. Major depressive disorder, single episode, moderate (HCC)  F32.1 sertraline (ZOLOFT) 100 MG tablet  2. Obsessive-compulsive disorder, unspecified type  F42.9 sertraline (ZOLOFT) 100 MG tablet    Past Psychiatric History: Past psychiatric hospitalization in 2013  Past Medical History:  Past Medical History:  Diagnosis Date  . Abdominal pain, right upper quadrant 06/10/2018  . Anxiety   . Arthritis   . Bursitis   . DDD (degenerative disc disease)   . Depression   . Fibromyalgia   . Obsessive-compulsive disorder   . Overactive bladder   . 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 PTSD (post-traumatic stress disorder)   . PVC's (premature ventricular contractions)    pt. placed on heart monitor x 24hours, everything fine   . Sesamoiditis February 2017   Family Psychiatric History: see below  Family History:  Family History  Problem Relation Age of Onset  . Cancer - Colon Mother        age 21  . Anxiety disorder Mother   . Diabetes Mother   . Atrial fibrillation Father   . Lung cancer Father   . Atrial fibrillation Brother   . Bipolar disorder Neg Hx   . Dementia Neg Hx   . Drug abuse Neg Hx   . Paranoid behavior Neg Hx   . Schizophrenia Neg Hx   . Seizures Neg Hx   . Sexual abuse Neg Hx   . Physical  abuse Neg Hx     Social History:  Social History   Socioeconomic History  . Marital status: Widowed    Spouse name: Not on file  . Number of children: 0  . Years of education: BA  . Highest education level: Not on file  Occupational History  . Occupation: [employer]- on leave (since 08/2020)  Tobacco Use  . Smoking status: Current Every Day Smoker    Packs/day: 1.00    Years: 15.00    Pack years: 15.00    Types: Cigarettes    Start date: 11/12/1996  . Smokeless tobacco: Never Used  . Tobacco comment: 1 pack a day x 20 yrs.  Vaping Use  . Vaping Use: Never used  Substance and Sexual  Activity  . Alcohol use: No    Alcohol/week: 0.0 standard drinks  . Drug use: No    Types: Hydrocodone, Benzodiazepines  . Sexual activity: Not on file  Other Topics Concern  . Not on file  Social History Narrative   Right handed   2-3 glasses soda per day   Lives alone   Social Determinants of Health   Financial Resource Strain:   . Difficulty of Paying Living Expenses: Not on file  Food Insecurity:   . Worried About Charity fundraiser in the Last Year: Not on file  . Ran Out of Food in the Last Year: Not on file  Transportation Needs:   . Lack of Transportation (Medical): Not on file  . Lack of Transportation (Non-Medical): Not on file  Physical Activity:   . Days of Exercise per Week: Not on file  . Minutes of Exercise per Session: Not on file  Stress:   . Feeling of Stress : Not on file  Social Connections:   . Frequency of Communication with Friends and Family: Not on file  . Frequency of Social Gatherings with Friends and Family: Not on file  . Attends Religious Services: Not on file  . Active Member of Clubs or Organizations: Not on file  . Attends Archivist Meetings: Not on file  . Marital Status: Not on file    Allergies:  Allergies  Allergen Reactions  . Gabapentin Hives  . Hydrocodone-Acetaminophen Nausea Only  . Penicillins Other (See Comments)    Unknown child hood reaction .Did it involve swelling of the face/tongue/throat, SOB, or low BP? Unknown Did it involve sudden or severe rash/hives, skin peeling, or any reaction on the inside of your mouth or nose? Unknown Did you need to seek medical attention at a hospital or doctor's office? Unknown When did it last happen? If all above answers are "NO", may proceed with cephalosporin use.     Metabolic Disorder Labs: No results found for: HGBA1C, MPG No results found for: PROLACTIN No results found for: CHOL, TRIG, HDL, CHOLHDL, VLDL, LDLCALC Lab Results  Component Value Date   TSH  1.770 10/13/2020   TSH 1.490 05/28/2016    Therapeutic Level Labs: No results found for: LITHIUM No results found for: VALPROATE No components found for:  CBMZ  Current Medications: Current Outpatient Medications  Medication Sig Dispense Refill  . aspirin 81 MG tablet Take 1 tablet (81 mg total) by mouth daily. For Polycythemia Vera.    . clonazePAM (KLONOPIN) 0.5 MG tablet Take 1 tablet (0.5 mg total) by mouth 3 (three) times daily as needed for anxiety. 90 tablet 2  . mirtazapine (REMERON) 15 MG tablet Take 1 tablet (15 mg total) by  mouth at bedtime. 30 tablet 2  . sertraline (ZOLOFT) 100 MG tablet TAKE 1 AND 1/2 TABLETS(150 MG) BY MOUTH DAILY 45 tablet 3  . traMADol (ULTRAM) 50 MG tablet Take 0.5 tablets (25 mg total) by mouth every 12 (twelve) hours as needed for moderate pain. 30 tablet 0   No current facility-administered medications for this visit.     Musculoskeletal: Strength & Muscle Tone: within normal limits Gait & Station: normal Patient leans: N/A  Psychiatric Specialty Exam: Review of Systems  Eyes: Positive for visual disturbance.  Psychiatric/Behavioral: The patient is nervous/anxious.   All other systems reviewed and are negative.   There were no vitals taken for this visit.There is no height or weight on file to calculate BMI.  General Appearance: Casual and Fairly Groomed  Eye Contact:  Good  Speech:  Clear and Coherent  Volume:  Normal  Mood:  Anxious  Affect:  Appropriate and Congruent  Thought Process:  Goal Directed  Orientation:  Full (Time, Place, and Person)  Thought Content: Rumination   Suicidal Thoughts:  No  Homicidal Thoughts:  No  Memory:  Immediate;   Good Recent;   Good Remote;   Good  Judgement:  Good  Insight:  Good  Psychomotor Activity:  Decreased  Concentration:  Concentration: Fair and Attention Span: Fair  Recall:  Good  Fund of Knowledge: Good  Language: Good  Akathisia:  No  Handed:  Right  AIMS (if indicated): not  done  Assets:  Communication Skills Desire for Improvement Resilience Social Support Talents/Skills  ADL's:  Intact  Cognition: WNL  Sleep:  Good   Screenings: PHQ2-9     Office Visit from 06/10/2018 in [place] Office Visit from 06/04/2017 in [place] Office Visit from 05/28/2016 in [place] Office Visit from 07/04/2015 in [place] Visit from 06/07/2014 in [place]  PHQ-2 Total Score 1 1 0 0 0       Assessment and Plan: This patient is a 54 year old female with a 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  She remains out on medical leave and I do not see that she will be able to return at anytime soon given that she has a very difficult time reading driving and is extremely stressed by all of this.  She is eating and sleeping better and her mood is improving.  She will continue mirtazapine 15 mg at bedtime for depression and sleep, Zoloft 150 mg daily for depression and clonazepam 0.5 mg up to 3 times daily for anxiety.  She will return to see me in 4 weeks   Cheryl Spiller, MD 10/19/2020, 1:54 PM

## 2020-10-31 ENCOUNTER — Telehealth (HOSPITAL_COMMUNITY): Payer: BC Managed Care – PPO | Admitting: Psychiatry

## 2020-11-15 ENCOUNTER — Telehealth (INDEPENDENT_AMBULATORY_CARE_PROVIDER_SITE_OTHER): Payer: BC Managed Care – PPO | Admitting: Psychiatry

## 2020-11-15 ENCOUNTER — Other Ambulatory Visit: Payer: Self-pay

## 2020-11-15 ENCOUNTER — Encounter (HOSPITAL_COMMUNITY): Payer: Self-pay | Admitting: Psychiatry

## 2020-11-15 DIAGNOSIS — F321 Major depressive disorder, single episode, moderate: Secondary | ICD-10-CM | POA: Diagnosis not present

## 2020-11-15 DIAGNOSIS — F429 Obsessive-compulsive disorder, unspecified: Secondary | ICD-10-CM | POA: Diagnosis not present

## 2020-11-15 MED ORDER — SERTRALINE HCL 100 MG PO TABS: ORAL_TABLET | ORAL | 3 refills | Status: DC

## 2020-11-15 MED ORDER — MIRTAZAPINE 15 MG PO TABS
15.0000 mg | ORAL_TABLET | Freq: Every day | ORAL | 2 refills | Status: DC
Start: 2020-11-15 — End: 2021-02-06

## 2020-11-15 MED ORDER — CLONAZEPAM 0.5 MG PO TABS: 0.5000 mg | ORAL_TABLET | Freq: Three times a day (TID) | ORAL | 2 refills | Status: DC | PRN

## 2020-11-15 NOTE — Progress Notes (Signed)
Virtual Visit via Video Note  I connected with Cheryl Cross on 11/15/20 at  1:40 PM EST by a video enabled telemedicine application and verified that I am speaking with the correct person using two identifiers.  Location: Patient: home Provider: office   I discussed the limitations of evaluation and management by telemedicine and the availability of in person appointments. The patient expressed understanding and agreed to proceed.   I discussed the assessment and treatment plan with the patient. The patient was provided an opportunity to ask questions and all were answered. The patient agreed with the plan and demonstrated an understanding of the instructions.   The patient was advised to call back or seek an in-person evaluation if the symptoms worsen or if the condition fails to improve as anticipated.  I provided 15 minutes of non-face-to-face time during this encounter.   [name], MD  [facility] MD/PA/NP OP Progress Note  11/15/2020 2:00 PM [name]  MRN:  [id number]  Chief Complaint:  Chief Complaint    Anxiety; Depression; Follow-up     HPI: This patient is a 55 year old widowed white female who lives alone in [place].  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  She still having significant visual problems with tracking and seeing double.  She has been back to her ophthalmologist who states there is nothing more that can be done.  She realizes that because of this and her anxiety she will be able to return to her job.  She is going to apply for short-term disability and eventually Social Security disability.  Her ophthalmologist is also going to help with this but I will fill out some paperwork for her.  The patient states that she gets easily overwhelmed and anxious when she has to do anything other than day-to-day chores.  When she had to fill out  disability paperwork she got extremely anxious and flustered currently because it is difficult for her to see.  She has been referred to a low vision agency.  She is eating better and has gained about 9 pounds.  She is sleeping well.  She does think the medications have helped her anxiety and depression she denies suicidal ideation. Visit Diagnosis:    ICD-10-CM   1. Major depressive disorder, single episode, moderate (HCC)  F32.1 sertraline (ZOLOFT) 100 MG tablet  2. Obsessive-compulsive disorder, unspecified type  F42.9 sertraline (ZOLOFT) 100 MG tablet    Past Psychiatric History: Past psychiatric hospitalization in 2013  Past Medical History:  Past Medical History:  Diagnosis Date  . Abdominal pain, right upper quadrant 06/10/2018  . Anxiety   . Arthritis   . Bursitis   . DDD (degenerative disc disease)   . Depression   . Fibromyalgia   . Obsessive-compulsive disorder   . Overactive bladder   . 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 PTSD (post-traumatic stress disorder)   . PVC's (premature ventricular contractions)    pt. placed on heart monitor x 24hours, everything fine   . Sesamoiditis February 2017    Past Surgical History:  Procedure Laterality Date  . ABDOMINAL HYSTERECTOMY    . ESOPHAGOGASTRODUODENOSCOPY N/A 09/10/2014   Procedure: ESOPHAGOGASTRODUODENOSCOPY (EGD);  Surgeon: Malissa Hippo, MD;  Location: [facility];  Service: Endoscopy;  Laterality: N/A;  130 - moved to 3:15 - Ann to notify  . LUMBAR FUSION    . TOOTH  EXTRACTION Left Aug 2016    Family Psychiatric History: see below  Family History:  Family History  Problem Relation Age of Onset  . Cancer - Colon Mother        age 76  . Anxiety disorder Mother   . Diabetes Mother   . Atrial fibrillation Father   . Lung cancer Father   . Atrial fibrillation Brother   . Bipolar disorder Neg Hx   . Dementia Neg Hx   . Drug abuse Neg Hx   . Paranoid behavior Neg Hx   . Schizophrenia Neg Hx   .  Seizures Neg Hx   . Sexual abuse Neg Hx   . Physical abuse Neg Hx     Social History:  Social History   Socioeconomic History  . Marital status: Widowed    Spouse name: Not on file  . Number of children: 0  . Years of education: BA  . Highest education level: Not on file  Occupational History  . Occupation: [employer]- on leave (since 08/2020)  Tobacco Use  . Smoking status: Current Every Day Smoker    Packs/day: 1.00    Years: 15.00    Pack years: 15.00    Types: Cigarettes    Start date: 11/12/1996  . Smokeless tobacco: Never Used  . Tobacco comment: 1 pack a day x 20 yrs.  Vaping Use  . Vaping Use: Never used  Substance and Sexual Activity  . Alcohol use: No    Alcohol/week: 0.0 standard drinks  . Drug use: No    Types: Hydrocodone, Benzodiazepines  . Sexual activity: Not on file  Other Topics Concern  . Not on file  Social History Narrative   Right handed   2-3 glasses soda per day   Lives alone   Social Determinants of Health   Financial Resource Strain: Not on file  Food Insecurity: Not on file  Transportation Needs: Not on file  Physical Activity: Not on file  Stress: Not on file  Social Connections: Not on file    Allergies:  Allergies  Allergen Reactions  . Gabapentin Hives  . Hydrocodone-Acetaminophen Nausea Only  . Penicillins Other (See Comments)    Unknown child hood reaction .Did it involve swelling of the face/tongue/throat, SOB, or low BP? Unknown Did it involve sudden or severe rash/hives, skin peeling, or any reaction on the inside of your mouth or nose? Unknown Did you need to seek medical attention at a hospital or doctor's office? Unknown When did it last happen? If all above answers are "NO", may proceed with cephalosporin use.     Metabolic Disorder Labs: No results found for: HGBA1C, MPG No results found for: PROLACTIN No results found for: CHOL, TRIG, HDL, CHOLHDL, VLDL, LDLCALC Lab Results   Component Value Date   TSH 1.770 10/13/2020   TSH 1.490 05/28/2016    Therapeutic Level Labs: No results found for: LITHIUM No results found for: VALPROATE No components found for:  CBMZ  Current Medications: Current Outpatient Medications  Medication Sig Dispense Refill  . aspirin 81 MG tablet Take 1 tablet (81 mg total) by mouth daily. For Polycythemia Vera.    . clonazePAM (KLONOPIN) 0.5 MG tablet Take 1 tablet (0.5 mg total) by mouth 3 (three) times daily as needed for anxiety. 90 tablet 2  . mirtazapine (REMERON) 15 MG tablet Take 1 tablet (15 mg total) by mouth at bedtime. 30 tablet 2  . sertraline (ZOLOFT) 100 MG tablet TAKE 1 AND 1/2  TABLETS(150 MG) BY MOUTH DAILY 45 tablet 3  . traMADol (ULTRAM) 50 MG tablet Take 0.5 tablets (25 mg total) by mouth every 12 (twelve) hours as needed for moderate pain. 30 tablet 0   No current facility-administered medications for this visit.     Musculoskeletal: Strength & Muscle Tone: within normal limits Gait & Station: normal Patient leans: N/A  Psychiatric Specialty Exam: Review of Systems  Eyes: Positive for visual disturbance.  Psychiatric/Behavioral: The patient is nervous/anxious.   All other systems reviewed and are negative.   There were no vitals taken for this visit.There is no height or weight on file to calculate BMI.  General Appearance: Casual and Fairly Groomed  Eye Contact:  Good  Speech:  Clear and Coherent  Volume:  Normal  Mood:  Anxious  Affect:  Appropriate  Thought Process:  Goal Directed  Orientation:  Full (Time, Place, and Person)  Thought Content: Rumination   Suicidal Thoughts:  No  Homicidal Thoughts:  No  Memory:  Immediate;   Good Recent;   Good Remote;   Good  Judgement:  Good  Insight:  Good  Psychomotor Activity:  Decreased  Concentration:  Concentration: Fair and Attention Span: Fair  Recall:  Good  Fund of Knowledge: Good  Language: Good  Akathisia:  No  Handed:  Right  AIMS (if  indicated): not done  Assets:  Communication Skills Desire for Improvement Resilience Social Support Talents/Skills  ADL's:  Intact  Cognition: WNL  Sleep:  Good   Screenings: PHQ2-9   [place] Office Visit from 06/10/2018 in [place] Office Visit from 06/04/2017 in [place] Office Visit from 05/28/2016 in [place] Office Visit from 07/04/2015 in [place] Visit from 06/07/2014 in [place]  PHQ-2 Total Score 1 1 0 0 0       Assessment and Plan: This patient is a 55 year old female with history of substance abuse remission depression insomnia and significant new visual problems.  She remains out on medical leave and I do not see her being able to go back given the significant visual problems that create anxiety.  The medications have helped with the anxiety to some degree as well as her depression and appetite.  Therefore she will continue mirtazapine 15 mg at bedtime for depression and sleep, Zoloft 150 mg daily for depression and clonazepam 0.5 mg up to 3 times daily for anxiety.  She will return to see me in 6 weeks   [name], MD 11/15/2020, 2:00 PM

## 2020-11-30 ENCOUNTER — Other Ambulatory Visit: Payer: Self-pay

## 2020-11-30 ENCOUNTER — Encounter: Payer: Self-pay | Admitting: Occupational Therapy

## 2020-11-30 ENCOUNTER — Ambulatory Visit: Payer: BC Managed Care – PPO | Attending: Optometry | Admitting: Occupational Therapy

## 2020-11-30 DIAGNOSIS — R41842 Visuospatial deficit: Secondary | ICD-10-CM | POA: Insufficient documentation

## 2020-11-30 DIAGNOSIS — H53413 Scotoma involving central area, bilateral: Secondary | ICD-10-CM | POA: Diagnosis present

## 2020-11-30 NOTE — Therapy (Signed)
[facility] [address] [place], [place], [address] Phone: [phone]   Fax:  [phone]  Occupational Therapy Treatment  Patient Details  Name: Cheryl Cross MRN: [id number] Date of Birth: 1966/09/11 Referring Provider (OT): Dr. Melissa Noon   Encounter Date: 11/30/2020   OT End of Session - 11/30/20 1245    Visit Number 1    Number of Visits 1    OT Start Time 4158    OT Stop Time 1109    OT Time Calculation (min) 48 min           Past Medical History:  Diagnosis Date  . Abdominal pain, right upper quadrant 06/10/2018  . Anxiety   . Arthritis   . Bursitis   . DDD (degenerative disc disease)   . Depression   . Fibromyalgia   . Obsessive-compulsive disorder   . Overactive bladder   . 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 PTSD (post-traumatic stress disorder)   . PVC's (premature ventricular contractions)    pt. placed on heart monitor x 24hours, everything fine   . Sesamoiditis February 2017    Past Surgical History:  Procedure Laterality Date  . ABDOMINAL HYSTERECTOMY    . ESOPHAGOGASTRODUODENOSCOPY N/A 09/10/2014   Procedure: ESOPHAGOGASTRODUODENOSCOPY (EGD);  Surgeon: Rogene Houston, MD;  [facility];  Service: Endoscopy;  Laterality: N/A;  130 - moved to 3:15 - Ann to notify  . LUMBAR FUSION    . TOOTH EXTRACTION Left Aug 2016    There were no vitals filed for this visit.   Subjective Assessment - 11/30/20 1020    Subjective  Pt reports she is currently on leave    Patient Stated Goals adapted strategies for ADLS    Currently in Pain? No/denies              [facility] OT Assessment - 11/30/20 0001      Assessment   Medical Diagnosis macular degeneration (Stargardt)    Referring Provider (OT) Dr. Melissa Noon    Onset Date/Surgical Date 10/25/20      Precautions   Precautions Fall      Balance Screen   Has the patient fallen in the past 6 months No    Has the  patient had a decrease in activity level because of a fear of falling?  No    Is the patient reluctant to leave their home because of a fear of falling?  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   Vocation Requirements on medical leave      ADL   ADL comments Pt is mod I for all basic ADLS, Pt reports difficulty with computer use , does not drive     IADL   Shopping Needs to be accompanied on any shopping trip    [place] alone or with occasional assistance    Meal Prep Able to complete simple warm meal prep    Community Mobility --   difficulty with depth perception with curbs / steps   Medication Management Is responsible for taking medication in correct dosages at correct time    Financial Management --   [employer], auto pay, and uses phone  Mobility   Mobility Status Independent      Vision Assessment   Vision Assessment Vision tested    Visual Acuity Per MD/OD report    Per MD/OD Report OD 20/80, OS 20/200    Reading Acuity (0.6)   warren text card   Visual Fields --   central visual field deficit   Patient has diffculty with activities due to visual impairment Reading bills   adjusting microwave   Comment Pt reports difficulty seeing  curbs, and stairs due to impaired depth perception. Pt was educated in eccentric viewing using clock. When looking at hands most numbers are missing, with head trun to right or left pt is able to visualize most numbers.      Cognition   Overall Cognitive Status Within Functional Limits for tasks assessed                            OT Education - 11/30/20 1535    Education Details Pt was educated regarding: eccentric viewing , use of 3x stand and handheld magnifier, use of handheld video  magnifier, recommendations were made about slowing down and scanning for safety when entering a new environments, use of hi marks for microwave and stove. Information provided from [employer] for purchase of AE. Pt demonstrates ability to read continuous text with hand held video magnifier or 3x handheld.    Person(s) Educated Patient    Methods Explanation;Demonstration;Verbal cues;Handout    Comprehension Verbalized understanding;Returned demonstration               OT Long Term Goals - 11/30/20 1250      OT LONG TERM GOAL #1   Title Pt will demonstrate understanding of AE use to maximize pt's independence with reading.    Time 1    Period Weeks    Status Achieved   met day of eval, pt demonstrates understanding.                Plan - 11/30/20 1246    Clinical Impression Statement Pt is a 55 y.o female with diagnosis of Stargardt's macular degeneration who presents with visual deficits which impedes performance of ADLS/ IADLS. Pt was provided with education on day of evaluation and therefore additional visits are no needed.    OT Occupational Profile and History Problem Focused Assessment - Including review of records relating to presenting problem    Occupational performance deficits (Please refer to evaluation for details): ADL's;IADL's;Leisure;Social Participation    Body Structure / Function / Physical Skills ADL;Vision    Rehab Potential Good    Clinical Decision Making Limited treatment options, no task modification necessary    Comorbidities Affecting Occupational Performance: None    Modification or Assistance to Complete Evaluation  No modification of tasks or assist necessary to complete eval    OT Frequency One time visit    OT Duration 4 weeks    OT Treatment/Interventions Self-care/ADL training;Visual/perceptual remediation/compensation;Patient/family education    Plan Pt was provided with education on day of eval. Additional visits are not necessary as pt  demonstrates good understanding of all education.    Consulted and Agree with Plan of Care Patient           Patient will benefit from skilled therapeutic intervention in order to improve the following deficits and impairments:   Body Structure / Function / Physical Skills: ADL,Vision       Visit Diagnosis: [place] involving central area,  bilateral  Visuospatial deficit    Problem List Patient Active Problem List   Diagnosis Date Noted  . Polyneuropathy 10/13/2020  . Vision disturbance 10/13/2020  . 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 Depression with anxiety 10/13/2020  . Major depressive disorder, single episode, moderate ([place]) 04/19/2020  . Cervical radiculitis 08/13/2018  . Abdominal pain, right upper quadrant 06/10/2018  . OCD (obsessive compulsive disorder) 02/25/2013  . 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 Benzodiazepine dependence ([place]) 03/13/2012  . Major depressive disorder 03/13/2012  . Polycythemia vera ([place]) 12/07/2011  . ABDOMINAL PAIN, EPIGASTRIC 03/28/2010  . IBS 01/30/2010  . ABDOMINAL PAIN, LEFT LOWER QUADRANT 01/30/2010    Joson Sapp 11/30/2020, 3:40 PM Theone Murdoch, OTR/L [phone] Phone: [phone] 3:41 PM 11/30/20 [facility] [address] [place], [place], [address] Phone: [phone]   Fax:  [phone]  Name: LESTER PLATAS MRN: [id number] Date of Birth: 1966/02/14

## 2020-12-14 ENCOUNTER — Encounter: Payer: Self-pay | Admitting: Neurology

## 2020-12-14 ENCOUNTER — Other Ambulatory Visit: Payer: Self-pay

## 2020-12-14 ENCOUNTER — Ambulatory Visit: Payer: BC Managed Care – PPO | Admitting: Neurology

## 2020-12-14 VITALS — BP 137/81 | HR 82 | Ht 67.0 in | Wt 120.0 lb

## 2020-12-14 DIAGNOSIS — R292 Abnormal reflex: Secondary | ICD-10-CM

## 2020-12-14 DIAGNOSIS — H539 Unspecified visual disturbance: Secondary | ICD-10-CM

## 2020-12-14 DIAGNOSIS — M5412 Radiculopathy, cervical region: Secondary | ICD-10-CM | POA: Diagnosis not present

## 2020-12-14 DIAGNOSIS — R200 Anesthesia of skin: Secondary | ICD-10-CM

## 2020-12-14 DIAGNOSIS — R269 Unspecified abnormalities of gait and mobility: Secondary | ICD-10-CM

## 2020-12-14 DIAGNOSIS — H3553 Other dystrophies primarily involving the sensory retina: Secondary | ICD-10-CM

## 2020-12-14 DIAGNOSIS — G629 Polyneuropathy, unspecified: Secondary | ICD-10-CM
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
## 2020-12-15 ENCOUNTER — Telehealth: Payer: Self-pay | Admitting: Neurology

## 2020-12-15 NOTE — Telephone Encounter (Signed)
Dillon Bjork: [id number] exp. 12/14/20 to 06/11/21)  Patient is scheduled at [facility] for 12/28/20 to arrive at 1:30 pm. Left a voicemail on patient phone of this information. Also left their number of [phone] if she needed to r/s.

## 2020-12-21 ENCOUNTER — Other Ambulatory Visit (HOSPITAL_COMMUNITY): Payer: Self-pay | Admitting: *Deleted

## 2020-12-21 DIAGNOSIS — D751 Secondary polycythemia: Secondary | ICD-10-CM

## 2020-12-21 DIAGNOSIS — D45 Polycythemia vera: Secondary | ICD-10-CM

## 2020-12-22 ENCOUNTER — Inpatient Hospital Stay (HOSPITAL_COMMUNITY): Payer: BC Managed Care – PPO | Attending: Hematology

## 2020-12-22 ENCOUNTER — Other Ambulatory Visit: Payer: Self-pay

## 2020-12-22 DIAGNOSIS — F17210 Nicotine dependence, cigarettes, uncomplicated: Secondary | ICD-10-CM | POA: Diagnosis not present

## 2020-12-22 DIAGNOSIS — Z7982 Long term (current) use of aspirin: Secondary | ICD-10-CM | POA: Diagnosis not present

## 2020-12-22 DIAGNOSIS — M1990 Unspecified osteoarthritis, unspecified site: Secondary | ICD-10-CM | POA: Insufficient documentation

## 2020-12-22 DIAGNOSIS — D751 Secondary polycythemia: Secondary | ICD-10-CM | POA: Diagnosis not present

## 2020-12-22 DIAGNOSIS — Z79899 Other long term (current) drug therapy: Secondary | ICD-10-CM | POA: Insufficient documentation

## 2020-12-22 DIAGNOSIS — D45 Polycythemia vera: Secondary | ICD-10-CM

## 2020-12-22 LAB — CBC WITH DIFFERENTIAL/PLATELET
Abs Immature Granulocytes: 0.02 10*3/uL (ref 0.00–0.07)
Basophils Absolute: 0 10*3/uL (ref 0.0–0.1)
Basophils Relative: 1 %
Eosinophils Absolute: 0.1 10*3/uL (ref 0.0–0.5)
Eosinophils Relative: 2 %
HCT: 42.2 % (ref 36.0–46.0)
Hemoglobin: 14 g/dL (ref 12.0–15.0)
Immature Granulocytes: 0 %
Lymphocytes Relative: 36 %
Lymphs Abs: 2.2 10*3/uL (ref 0.7–4.0)
MCH: 31.9 pg (ref 26.0–34.0)
MCHC: 33.2 g/dL (ref 30.0–36.0)
MCV: 96.1 fL (ref 80.0–100.0)
Monocytes Absolute: 0.5 10*3/uL (ref 0.1–1.0)
Monocytes Relative: 9 %
Neutro Abs: 3.1 10*3/uL (ref 1.7–7.7)
Neutrophils Relative %: 52 %
Platelets: 272 10*3/uL (ref 150–400)
RBC: 4.39 MIL/uL (ref 3.87–5.11)
RDW: 12 % (ref 11.5–15.5)
WBC: 6 10*3/uL (ref 4.0–10.5)
nRBC: 0 % (ref 0.0–0.2)

## 2020-12-22 LAB — COMPREHENSIVE METABOLIC PANEL
ALT: 20 U/L (ref 0–44)
AST: 21 U/L (ref 15–41)
Albumin: 4.1 g/dL (ref 3.5–5.0)
Alkaline Phosphatase: 59 U/L (ref 38–126)
Anion gap: 7 (ref 5–15)
BUN: 6 mg/dL (ref 6–20)
CO2: 28 mmol/L (ref 22–32)
Calcium: 8.8 mg/dL — ABNORMAL LOW (ref 8.9–10.3)
Chloride: 98 mmol/L (ref 98–111)
Creatinine, Ser: 0.62 mg/dL (ref 0.44–1.00)
GFR, Estimated: >60 mL/min (ref >60)
Glucose, Bld: 99 mg/dL (ref 70–99)
Potassium: 3.8 mmol/L (ref 3.5–5.1)
Sodium: 133 mmol/L — ABNORMAL LOW (ref 135–145)
Total Bilirubin: 0.4 mg/dL (ref 0.3–1.2)
Total Protein: 6.8 g/dL (ref 6.5–8.1)

## 2020-12-23 ENCOUNTER — Encounter (INDEPENDENT_AMBULATORY_CARE_PROVIDER_SITE_OTHER): Payer: Self-pay

## 2020-12-26 ENCOUNTER — Other Ambulatory Visit: Payer: Self-pay

## 2020-12-26 ENCOUNTER — Ambulatory Visit (INDEPENDENT_AMBULATORY_CARE_PROVIDER_SITE_OTHER): Payer: BC Managed Care – PPO | Admitting: Gastroenterology

## 2020-12-26 ENCOUNTER — Telehealth (INDEPENDENT_AMBULATORY_CARE_PROVIDER_SITE_OTHER): Payer: Self-pay

## 2020-12-26 ENCOUNTER — Encounter (INDEPENDENT_AMBULATORY_CARE_PROVIDER_SITE_OTHER): Payer: Self-pay

## 2020-12-26 ENCOUNTER — Ambulatory Visit (HOSPITAL_COMMUNITY)
Admission: RE | Admit: 2020-12-26 | Discharge: 2020-12-26 | Disposition: A | Discharge status: Home / Self Care | Payer: BC Managed Care – PPO | Source: Ambulatory Visit | Attending: Internal Medicine | Admitting: Internal Medicine

## 2020-12-26 ENCOUNTER — Other Ambulatory Visit (INDEPENDENT_AMBULATORY_CARE_PROVIDER_SITE_OTHER): Payer: Self-pay

## 2020-12-26 ENCOUNTER — Encounter (INDEPENDENT_AMBULATORY_CARE_PROVIDER_SITE_OTHER): Payer: Self-pay | Admitting: Gastroenterology

## 2020-12-26 VITALS — BP 123/84 | HR 79 | Temp 98.0°F | Ht 67.0 in | Wt 124.0 lb

## 2020-12-26 DIAGNOSIS — K582 Mixed irritable bowel syndrome: Secondary | ICD-10-CM

## 2020-12-26 DIAGNOSIS — Z1231 Encounter for screening mammogram for malignant neoplasm of breast: Secondary | ICD-10-CM | POA: Insufficient documentation

## 2020-12-26 DIAGNOSIS — Z1211 Encounter for screening for malignant neoplasm of colon: Secondary | ICD-10-CM

## 2020-12-26 IMAGING — MG MM DIGITAL SCREENING BILAT W/ TOMO AND CAD
6 of 10 series · 6 of 30 positions shown · non-contrast
Comparison: Previous exam(s).

CLINICAL DATA: Screening.

EXAM:
DIGITAL SCREENING BILATERAL MAMMOGRAM WITH TOMOSYNTHESIS AND CAD
TECHNIQUE: Bilateral screening digital craniocaudal and mediolateral oblique
mammograms were obtained. Bilateral screening digital breast
tomosynthesis was performed. The images were evaluated with
computer-aided detection.

[R MLO synth-2D (1 of 2)]
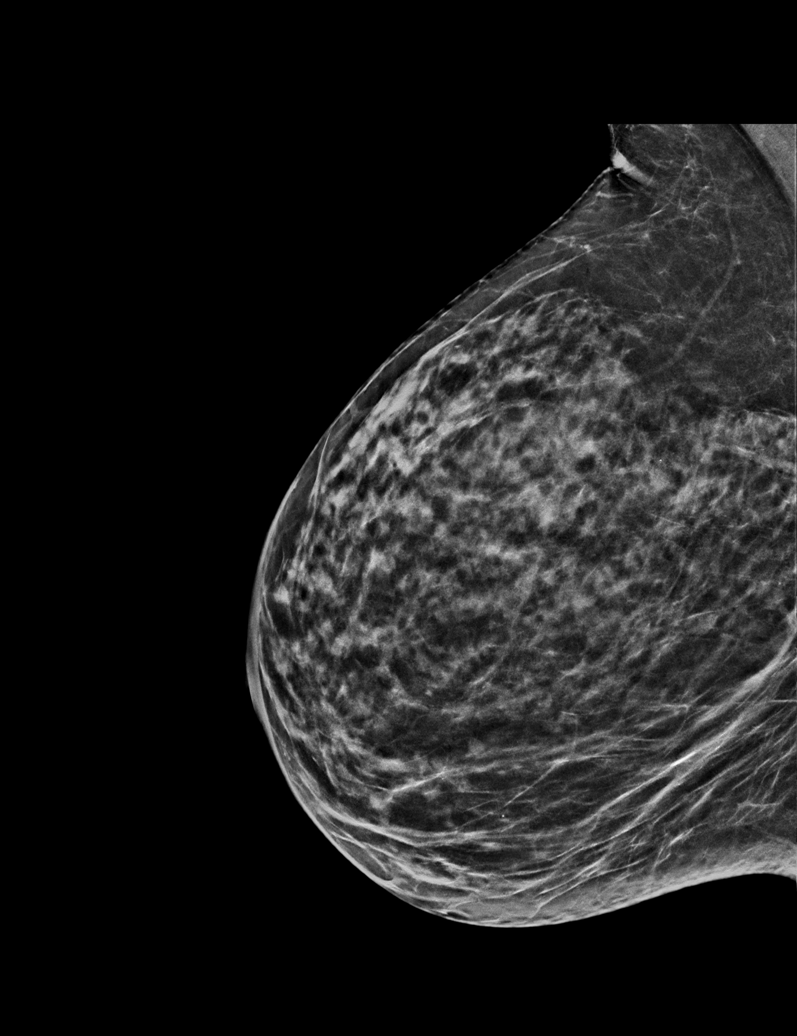

[L MLO synth-2D]
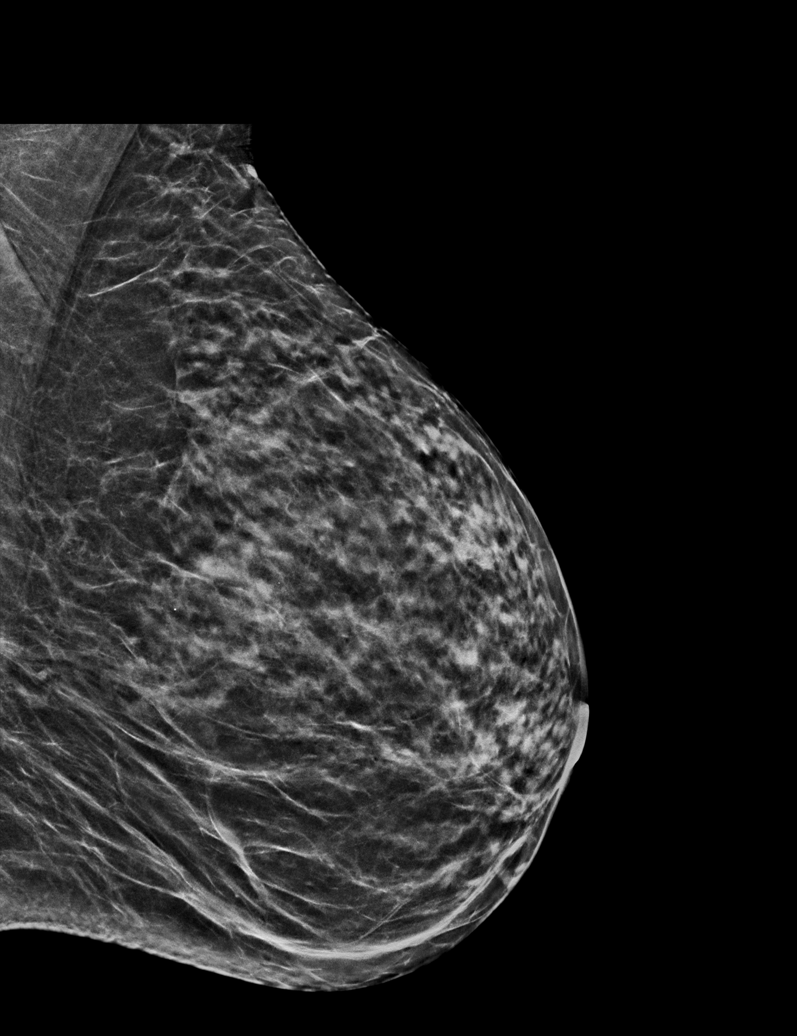

[L CC synth-2D]
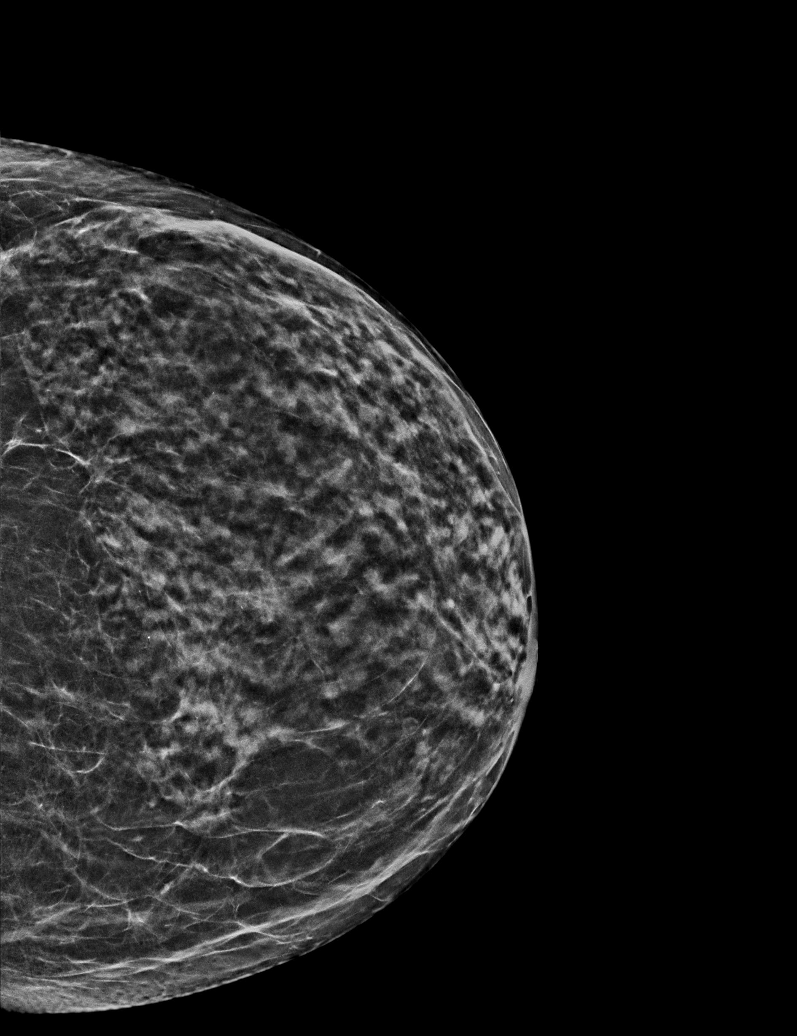

[R CC synth-2D]
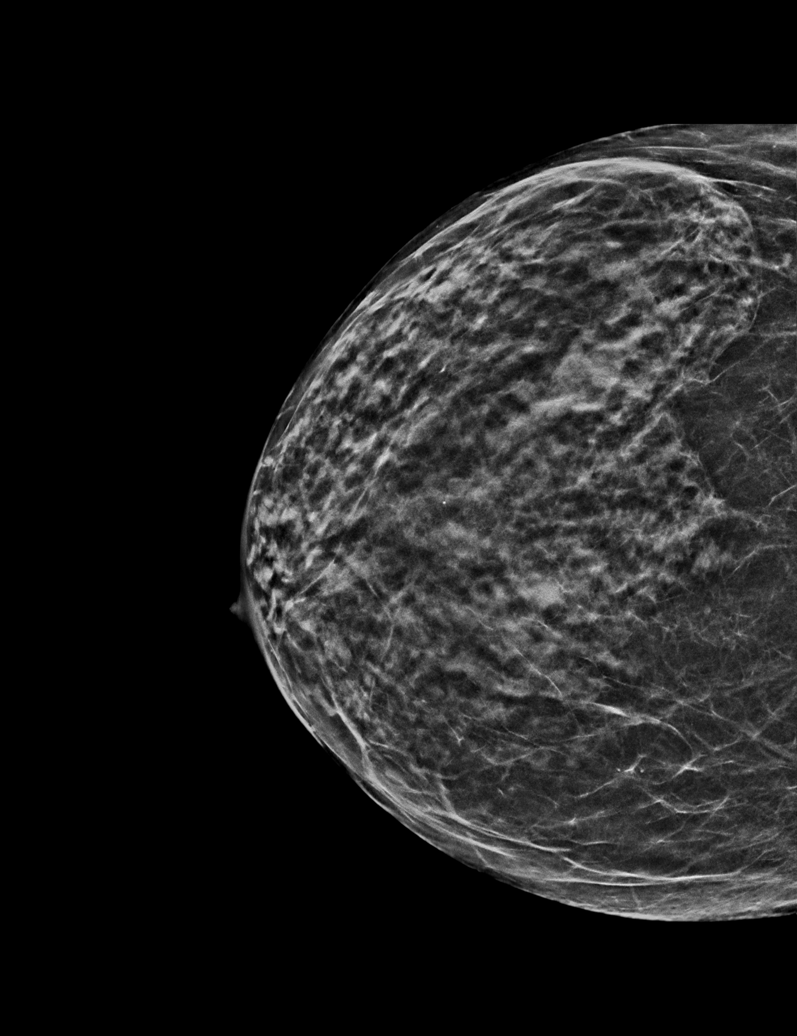

[R MLO synth-2D (2 of 2)]
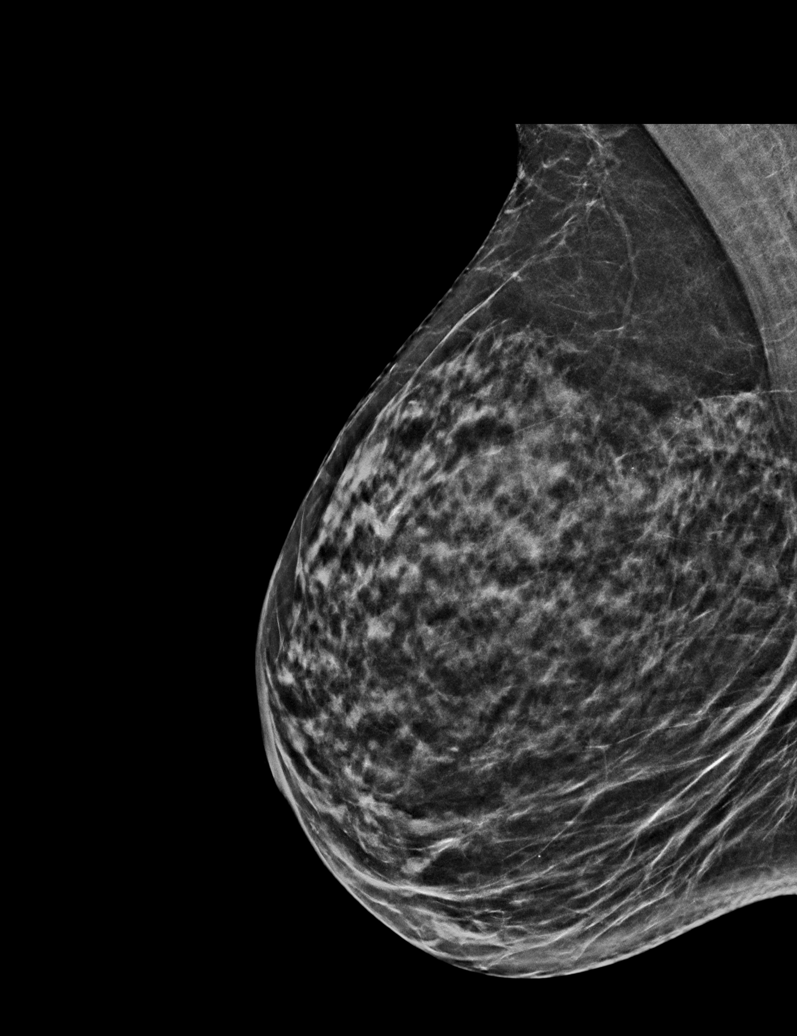

[L CC tomo · tomo slice 24/47.0]
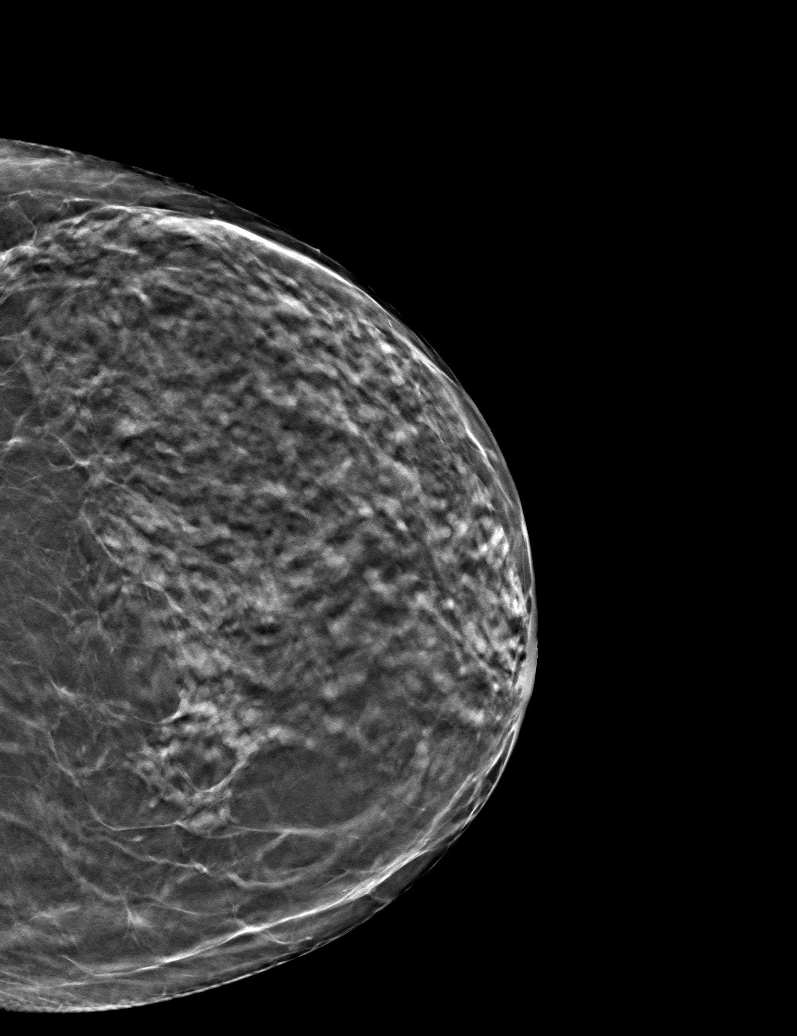

[6 of 30 positions shown; findings below may reference images not displayed]

ACR Breast Density Category c: The breast tissue is heterogeneously
dense, which may obscure small masses.
FINDINGS: There are no findings suspicious for malignancy.
IMPRESSION: No mammographic evidence of malignancy. A result letter of this
screening mammogram will be mailed directly to the patient.

RECOMMENDATION:
Screening mammogram in one year. (Code:[V2])

BI-RADS CATEGORY  1: Negative.

## 2020-12-26 MED ORDER — PEG 3350-KCL-NA BICARB-NACL 420 G PO SOLR: 4000.0000 mL | ORAL | 0 refills | Status: DC

## 2020-12-26 MED ORDER — DICYCLOMINE HCL 10 MG PO CAPS: 10.0000 mg | ORAL_CAPSULE | Freq: Two times a day (BID) | ORAL | 2 refills | Status: DC | PRN

## 2020-12-26 NOTE — H&P (View-Only) (Signed)
Maylon Peppers, M.D. Gastroenterology & Hepatology [facility] [address] [place], [place] [address] Primary Care Physician: Redmond School, MD [address] [place] [address]  Referring MD: PCP  Chief Complaint: Abdominal pain  History of Present Illness: Cheryl Cross is a 56 y.o. female with PMH IBS-M, anxiety, depression, fibromyalgia, OCD, PTSD, who presents for evaluation of abdominal pain.  The patient reports that for at least 10 years she has presented RUQ pain intermittently. States that she has the discomfort 3-4 times a week, which lasts for a few minutes but she states that she does not take anything for the pain as it usually resolves on its own. She describes the pain as stabbing and does not radiate anywhere else. She also reports a feeling of persistent fullness through the day in her upper abdomen, which she reports got worse for the last year. It initially affected her appetite but now she has been eating better than in the past - now she is eating more fiber but does not follow any specific diet, does not eat prunes or kiwi. She states having some bloating.  On the other hand, patient states that she used to have urgency and episodes of diarrhea for multiple years for which she was seen in the GI clinic 3 years ago, but 6 months ago she noticed a change in her bowel movement frequency. Now she is having a bowel movement every other day, has to strain significantly to have a BM sometimes; however, she does not take any laxatives.  Notably, she is taking tramadol once a week for back pain.  Patient reports that her husband passed away in 12/01/19 and this affected her significantly, lost significant amount of weight. She is also having visual problems now, being evaluated bya  Neurologist for this. She is on work leave for now. Also has cervical disease in the spine. This has led to significant stress. Her  psychiatrist started her on Remeron which helped her gain her weight again, has been taking this medication compliantly.  The patient underwent a HIDA scan in 05/09/2020 which was normal.  Also had a complete US abdomen on 04/20/20 which showed a small GB polyp but no other alteration.  Last [id number] -normal EGD Last Colonoscopy: 2011 1) Normal colon  Random biopsy:  1. COLON, BIOPSY, RANDOM : BENIGN COLONIC MUCOSA. NO MICROSCOPIC COLITIS, ACTIVE INFLAMMATION OR GRANULOMAS.  FHx: neg for any gastrointestinal/liver disease, mother colon cancer diagnosed in her 50 Social: smokes a pack a day, neg alcohol or illicit drug use Surgical: hysterectomy  Past Medical History: Past Medical History:  Diagnosis Date  . Abdominal pain, right upper quadrant 06/10/2018  . Anxiety   . Arthritis   . Bursitis   . DDD (degenerative disc disease)   . Depression   . Fibromyalgia   . Obsessive-compulsive disorder   . Overactive bladder   . 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 PTSD (post-traumatic stress disorder)   . PVC's (premature ventricular contractions)    pt. placed on heart monitor x 24hours, everything fine   . Sesamoiditis February 2017    Past Surgical History: Past Surgical History:  Procedure Laterality Date  . ABDOMINAL HYSTERECTOMY    . ESOPHAGOGASTRODUODENOSCOPY N/A 09/10/2014   Procedure: ESOPHAGOGASTRODUODENOSCOPY (EGD);  Surgeon: Rogene Houston, MD;  [facility];  Service: Endoscopy;  Laterality: N/A;  130 - moved to 3:15 - Ann to notify  . LUMBAR FUSION    . TOOTH  EXTRACTION Left Aug 2016    Family History: Family History  Problem Relation Age of Onset  . Cancer - Colon Mother        age 40  . Anxiety disorder Mother   . Diabetes Mother   . Atrial fibrillation Father   . Lung cancer Father   . Atrial fibrillation Brother   . Bipolar disorder Neg Hx   . Dementia Neg Hx   . Drug abuse Neg Hx   . Paranoid behavior Neg Hx   . Schizophrenia Neg Hx   .  Seizures Neg Hx   . Sexual abuse Neg Hx   . Physical abuse Neg Hx     Social History: Social History   Tobacco Use  Smoking Status Current Every Day Smoker  . Packs/day: 1.00  . Years: 15.00  . Pack years: 15.00  . Types: Cigarettes  . Start date: 11/12/1996  Smokeless Tobacco Never Used  Tobacco Comment   1 pack a day x 20 yrs.   Social History   Substance and Sexual Activity  Alcohol Use No  . Alcohol/week: 0.0 standard drinks   Social History   Substance and Sexual Activity  Drug Use No  . Types: Hydrocodone, Benzodiazepines    Allergies: Allergies  Allergen Reactions  . Gabapentin Hives  . Hydrocodone-Acetaminophen Nausea Only  . Penicillins Other (See Comments)    Unknown child hood reaction .Did it involve swelling of the face/tongue/throat, SOB, or low BP? Unknown Did it involve sudden or severe rash/hives, skin peeling, or any reaction on the inside of your mouth or nose? Unknown Did you need to seek medical attention at a hospital or doctor's office? Unknown When did it last happen? If all above answers are "NO", may proceed with cephalosporin use.     Medications: Current Outpatient Medications  Medication Sig Dispense Refill  . aspirin 81 MG tablet Take 1 tablet (81 mg total) by mouth daily. For Polycythemia Vera.    . clonazePAM (KLONOPIN) 0.5 MG tablet Take 1 tablet (0.5 mg total) by mouth 3 (three) times daily as needed for anxiety. 90 tablet 2  . Cyanocobalamin (VITAMIN B-12 PO) Take 1 tablet by mouth daily.    . mirtazapine (REMERON) 15 MG tablet Take 1 tablet (15 mg total) by mouth at bedtime. 30 tablet 2  . sertraline (ZOLOFT) 100 MG tablet TAKE 1 AND 1/2 TABLETS(150 MG) BY MOUTH DAILY 45 tablet 3  . traMADol (ULTRAM) 50 MG tablet Take 0.5 tablets (25 mg total) by mouth every 12 (twelve) hours as needed for moderate pain. 30 tablet 0  . VITAMIN A PO Take 1 tablet by mouth daily.     No current facility-administered medications for this  visit.    Review of Systems: GENERAL: negative for malaise, night sweats HEENT: No changes in hearing or vision, no nose bleeds or other nasal problems. NECK: Negative for lumps, goiter, pain and significant neck swelling RESPIRATORY: Negative for cough, wheezing CARDIOVASCULAR: Negative for chest pain, leg swelling, palpitations, orthopnea GI: SEE HPI MUSCULOSKELETAL: Negative for joint pain or swelling, back pain, and muscle pain. SKIN: Negative for lesions, rash PSYCH: Negative for sleep disturbance, mood disorder and recent psychosocial stressors. HEMATOLOGY Negative for prolonged bleeding, bruising easily, and swollen nodes. ENDOCRINE: Negative for cold or heat intolerance, polyuria, polydipsia and goiter. NEURO: negative for tremor, gait imbalance, syncope and seizures. The remainder of the review of systems is noncontributory.   Physical Exam: BP 123/84 (BP Location: Left Arm, Patient Position: Sitting, Cuff  Size: Large)   Pulse 79   Temp 98 F (36.7 C) (Oral)   Ht 5\' 7"  (1.702 m)   Wt 124 lb (56.2 kg)   BMI 19.42 kg/m  GENERAL: The patient is AO x3, in no acute distress. HEENT: Head is normocephalic and atraumatic. EOMI are intact. Mouth is well hydrated and without lesions. NECK: Supple. No masses LUNGS: Clear to auscultation. No presence of rhonchi/wheezing/rales. Adequate chest expansion HEART: RRR, normal s1 and s2. ABDOMEN: Soft, nontender, no guarding, no peritoneal signs, and nondistended. BS +. No masses. EXTREMITIES: Without any cyanosis, clubbing, rash, lesions or edema. NEUROLOGIC: AOx3, no focal motor deficit. SKIN: no jaundice, no rashes   Imaging/Labs: as above  I personally reviewed and interpreted the available labs, imaging and endoscopic files.  Impression and Plan: Cheryl Cross is a 55 y.o. female with PMH IBS-M, anxiety, depression, fibromyalgia, OCD, PTSD, who presents for evaluation of abdominal pain.  Patient has presented intermittent  recurrent symptoms of abdominal pain and chronic change in her bowel movements characterized initially by diarrhea but now presenting with constipation.  I consider that most of her symptoms are related to her IBS as she has not presented any red flag signs or alteration in imaging.  Nevertheless, given the severity of her pain episodes, we will investigate this further with an EGD with small bowel biopsies.  I recommended her to implement a low FODMAP diet and to take Bentyl as needed to manage her abdominal pain.  We will also check celiac serologies today.  Finally, the patient was advised to start MiraLAX intake to improve her bowel movement frequency.  The patient is due for colorectal cancer screening, for which we will proceed with a colonoscopy.  - Explained presumed etiology of IBS symptoms. Patient was counseled about the benefit of implementing a low FODMAP to improve symptoms and recurrent episodes. A dietary list was provided to the patient. Also, the patient was counseled about the benefit of avoiding stressing situations and potential environmental triggers leading to symptomatology. -Start Bentyl 1 tablet 12 hours as needed. -Schedule EGD and colonoscopy -TTG IgA -Start taking Miralax 1 cap every day for one week. If bowel movements do not improve, increase to 1 cup every 12 hours. If after two weeks there is no improvement, increase to 1 cup every 8 hours  All questions were answered.      Maylon Peppers, MD Gastroenterology and Hepatology [facility]

## 2020-12-26 NOTE — Patient Instructions (Addendum)
Explained presumed etiology of IBS symptoms. Patient was counseled about the benefit of implementing a low FODMAP to improve symptoms and recurrent episodes. A dietary list was provided to the patient. Also, the patient was counseled about the benefit of avoiding stressing situations and potential environmental triggers leading to symptomatology. Start Bentyl 1 tablet 12 hours as needed. Schedule EGD and colonoscopy Perform blood workup Start taking Miralax 1 cap every day for one week. If bowel movements do not improve, increase to 1 cup every 12 hours. If after two weeks there is no improvement, increase to 1 cup every 8 hours

## 2020-12-26 NOTE — Telephone Encounter (Signed)
LeighAnn Tangala Wiegert, CMA  

## 2020-12-26 NOTE — Progress Notes (Signed)
Maylon Peppers, M.D. Gastroenterology & Hepatology [facility] [address] [place], [place] [address] Primary Care Physician: Redmond School, MD [address] [place] [address]  Referring MD: PCP  Chief Complaint: Abdominal pain  History of Present Illness: Cheryl Cross is a 55 y.o. female with PMH IBS-M, anxiety, depression, fibromyalgia, OCD, PTSD, who presents for evaluation of abdominal pain.  The patient reports that for at least 10 years she has presented RUQ pain intermittently. States that she has the discomfort 3-4 times a week, which lasts for a few minutes but she states that she does not take anything for the pain as it usually resolves on its own. She describes the pain as stabbing and does not radiate anywhere else. She also reports a feeling of persistent fullness through the day in her upper abdomen, which she reports got worse for the last year. It initially affected her appetite but now she has been eating better than in the past - now she is eating more fiber but does not follow any specific diet, does not eat prunes or kiwi. She states having some bloating.  On the other hand, patient states that she used to have urgency and episodes of diarrhea for multiple years for which she was seen in the GI clinic 3 years ago, but 6 months ago she noticed a change in her bowel movement frequency. Now she is having a bowel movement every other day, has to strain significantly to have a BM sometimes; however, she does not take any laxatives.  Notably, she is taking tramadol once a week for back pain.  Patient reports that her husband passed away in 2019-11-20 and this affected her significantly, lost significant amount of weight. She is also having visual problems now, being evaluated bya  Neurologist for this. She is on work leave for now. Also has cervical disease in the spine. This has led to significant stress. Her  psychiatrist started her on Remeron which helped her gain her weight again, has been taking this medication compliantly.  The patient underwent a HIDA scan in 05/09/2020 which was normal.  Also had a complete US abdomen on 04/20/20 which showed a small GB polyp but no other alteration.  Last [id number] -normal EGD Last Colonoscopy: 2011 1) Normal colon  Random biopsy:  1. COLON, BIOPSY, RANDOM : BENIGN COLONIC MUCOSA. NO MICROSCOPIC COLITIS, ACTIVE INFLAMMATION OR GRANULOMAS.  FHx: neg for any gastrointestinal/liver disease, mother colon cancer diagnosed in her 58 Social: smokes a pack a day, neg alcohol or illicit drug use Surgical: hysterectomy  Past Medical History: Past Medical History:  Diagnosis Date  . Abdominal pain, right upper quadrant 06/10/2018  . Anxiety   . Arthritis   . Bursitis   . DDD (degenerative disc disease)   . Depression   . Fibromyalgia   . Obsessive-compulsive disorder   . Overactive bladder   . 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 PTSD (post-traumatic stress disorder)   . PVC's (premature ventricular contractions)    pt. placed on heart monitor x 24hours, everything fine   . Sesamoiditis February 2017    Past Surgical History: Past Surgical History:  Procedure Laterality Date  . ABDOMINAL HYSTERECTOMY    . ESOPHAGOGASTRODUODENOSCOPY N/A 09/10/2014   Procedure: ESOPHAGOGASTRODUODENOSCOPY (EGD);  Surgeon: Rogene Houston, MD;  [facility];  Service: Endoscopy;  Laterality: N/A;  130 - moved to 3:15 - Ann to notify  . LUMBAR FUSION    . TOOTH  EXTRACTION Left Aug 2016    Family History: Family History  Problem Relation Age of Onset  . Cancer - Colon Mother        age 60  . Anxiety disorder Mother   . Diabetes Mother   . Atrial fibrillation Father   . Lung cancer Father   . Atrial fibrillation Brother   . Bipolar disorder Neg Hx   . Dementia Neg Hx   . Drug abuse Neg Hx   . Paranoid behavior Neg Hx   . Schizophrenia Neg Hx   .  Seizures Neg Hx   . Sexual abuse Neg Hx   . Physical abuse Neg Hx     Social History: Social History   Tobacco Use  Smoking Status Current Every Day Smoker  . Packs/day: 1.00  . Years: 15.00  . Pack years: 15.00  . Types: Cigarettes  . Start date: 11/12/1996  Smokeless Tobacco Never Used  Tobacco Comment   1 pack a day x 20 yrs.   Social History   Substance and Sexual Activity  Alcohol Use No  . Alcohol/week: 0.0 standard drinks   Social History   Substance and Sexual Activity  Drug Use No  . Types: Hydrocodone, Benzodiazepines    Allergies: Allergies  Allergen Reactions  . Gabapentin Hives  . Hydrocodone-Acetaminophen Nausea Only  . Penicillins Other (See Comments)    Unknown child hood reaction .Did it involve swelling of the face/tongue/throat, SOB, or low BP? Unknown Did it involve sudden or severe rash/hives, skin peeling, or any reaction on the inside of your mouth or nose? Unknown Did you need to seek medical attention at a hospital or doctor's office? Unknown When did it last happen? If all above answers are "NO", may proceed with cephalosporin use.     Medications: Current Outpatient Medications  Medication Sig Dispense Refill  . aspirin 81 MG tablet Take 1 tablet (81 mg total) by mouth daily. For Polycythemia Vera.    . clonazePAM (KLONOPIN) 0.5 MG tablet Take 1 tablet (0.5 mg total) by mouth 3 (three) times daily as needed for anxiety. 90 tablet 2  . Cyanocobalamin (VITAMIN B-12 PO) Take 1 tablet by mouth daily.    . mirtazapine (REMERON) 15 MG tablet Take 1 tablet (15 mg total) by mouth at bedtime. 30 tablet 2  . sertraline (ZOLOFT) 100 MG tablet TAKE 1 AND 1/2 TABLETS(150 MG) BY MOUTH DAILY 45 tablet 3  . traMADol (ULTRAM) 50 MG tablet Take 0.5 tablets (25 mg total) by mouth every 12 (twelve) hours as needed for moderate pain. 30 tablet 0  . VITAMIN A PO Take 1 tablet by mouth daily.     No current facility-administered medications for this  visit.    Review of Systems: GENERAL: negative for malaise, night sweats HEENT: No changes in hearing or vision, no nose bleeds or other nasal problems. NECK: Negative for lumps, goiter, pain and significant neck swelling RESPIRATORY: Negative for cough, wheezing CARDIOVASCULAR: Negative for chest pain, leg swelling, palpitations, orthopnea GI: SEE HPI MUSCULOSKELETAL: Negative for joint pain or swelling, back pain, and muscle pain. SKIN: Negative for lesions, rash PSYCH: Negative for sleep disturbance, mood disorder and recent psychosocial stressors. HEMATOLOGY Negative for prolonged bleeding, bruising easily, and swollen nodes. ENDOCRINE: Negative for cold or heat intolerance, polyuria, polydipsia and goiter. NEURO: negative for tremor, gait imbalance, syncope and seizures. The remainder of the review of systems is noncontributory.   Physical Exam: BP 123/84 (BP Location: Left Arm, Patient Position: Sitting, Cuff  Size: Large)   Pulse 79   Temp 98 F (36.7 C) (Oral)   Ht 5\' 7"  (1.702 m)   Wt 124 lb (56.2 kg)   BMI 19.42 kg/m  GENERAL: The patient is AO x3, in no acute distress. HEENT: Head is normocephalic and atraumatic. EOMI are intact. Mouth is well hydrated and without lesions. NECK: Supple. No masses LUNGS: Clear to auscultation. No presence of rhonchi/wheezing/rales. Adequate chest expansion HEART: RRR, normal s1 and s2. ABDOMEN: Soft, nontender, no guarding, no peritoneal signs, and nondistended. BS +. No masses. EXTREMITIES: Without any cyanosis, clubbing, rash, lesions or edema. NEUROLOGIC: AOx3, no focal motor deficit. SKIN: no jaundice, no rashes   Imaging/Labs: as above  I personally reviewed and interpreted the available labs, imaging and endoscopic files.  Impression and Plan: Cheryl Cross is a 56 y.o. female with PMH IBS-M, anxiety, depression, fibromyalgia, OCD, PTSD, who presents for evaluation of abdominal pain.  Patient has presented intermittent  recurrent symptoms of abdominal pain and chronic change in her bowel movements characterized initially by diarrhea but now presenting with constipation.  I consider that most of her symptoms are related to her IBS as she has not presented any red flag signs or alteration in imaging.  Nevertheless, given the severity of her pain episodes, we will investigate this further with an EGD with small bowel biopsies.  I recommended her to implement a low FODMAP diet and to take Bentyl as needed to manage her abdominal pain.  We will also check celiac serologies today.  Finally, the patient was advised to start MiraLAX intake to improve her bowel movement frequency.  The patient is due for colorectal cancer screening, for which we will proceed with a colonoscopy.  - Explained presumed etiology of IBS symptoms. Patient was counseled about the benefit of implementing a low FODMAP to improve symptoms and recurrent episodes. A dietary list was provided to the patient. Also, the patient was counseled about the benefit of avoiding stressing situations and potential environmental triggers leading to symptomatology. -Start Bentyl 1 tablet 12 hours as needed. -Schedule EGD and colonoscopy -TTG IgA -Start taking Miralax 1 cap every day for one week. If bowel movements do not improve, increase to 1 cup every 12 hours. If after two weeks there is no improvement, increase to 1 cup every 8 hours  All questions were answered.      Maylon Peppers, MD Gastroenterology and Hepatology [facility]

## 2020-12-27 ENCOUNTER — Telehealth (INDEPENDENT_AMBULATORY_CARE_PROVIDER_SITE_OTHER): Payer: Self-pay | Admitting: *Deleted

## 2020-12-27 ENCOUNTER — Encounter (HOSPITAL_COMMUNITY): Payer: Self-pay | Admitting: Psychiatry

## 2020-12-27 ENCOUNTER — Telehealth (INDEPENDENT_AMBULATORY_CARE_PROVIDER_SITE_OTHER): Payer: BC Managed Care – PPO | Admitting: Psychiatry

## 2020-12-27 DIAGNOSIS — F418 Other specified anxiety disorders: Secondary | ICD-10-CM | POA: Diagnosis not present

## 2020-12-27 DIAGNOSIS — F5105 Insomnia due to other mental disorder: Secondary | ICD-10-CM | POA: Diagnosis not present

## 2020-12-27 DIAGNOSIS — F429 Obsessive-compulsive disorder, unspecified: Secondary | ICD-10-CM | POA: Diagnosis not present

## 2020-12-27 DIAGNOSIS — F321 Major depressive disorder, single episode, moderate: Secondary | ICD-10-CM | POA: Diagnosis not present

## 2020-12-27 LAB — IGA: Immunoglobulin A: 145 mg/dL (ref 47–310)

## 2020-12-27 LAB — TISSUE TRANSGLUTAMINASE, IGA: (tTG) Ab, IgA: <1 U/mL

## 2020-12-27 MED ORDER — TRAZODONE HCL 50 MG PO TABS: 50.0000 mg | ORAL_TABLET | Freq: Every day | ORAL | 3 refills | Status: DC

## 2020-12-27 MED ORDER — SERTRALINE HCL 100 MG PO TABS: ORAL_TABLET | ORAL | 3 refills | Status: DC

## 2020-12-27 MED ORDER — CLONAZEPAM 0.5 MG PO TABS: 0.5000 mg | ORAL_TABLET | Freq: Three times a day (TID) | ORAL | 2 refills | Status: DC | PRN

## 2020-12-27 NOTE — Telephone Encounter (Signed)
returning crystal's call  Please call [phone]

## 2020-12-27 NOTE — Progress Notes (Signed)
Virtual Visit via Video Note  I connected with Cheryl Cross on 12/27/20 at  1:20 PM EST by a video enabled telemedicine application and verified that I am speaking with the correct person using two identifiers.  Location: Patient: home Provider: office   I discussed the limitations of evaluation and management by telemedicine and the availability of in person appointments. The patient expressed understanding and agreed to proceed    I discussed the assessment and treatment plan with the patient. The patient was provided an opportunity to ask questions and all were answered. The patient agreed with the plan and demonstrated an understanding of the instructions.   The patient was advised to call back or seek an in-person evaluation if the symptoms worsen or if the condition fails to improve as anticipated.  I provided 15 minutes of non-face-to-face time during this encounter.   [name], MD  [facility] MD/PA/NP OP Progress Note  12/27/2020 1:41 PM Cheryl Cross  MRN:  [id number]  Chief Complaint:  Chief Complaint    Depression; Anxiety; Follow-up     HPI: This patient is a 55 year old widowed white female who lives alone in [place]. 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  She still having significant visual problems with tracking and seeing double.  She is also having problems with neck pain and is scheduled for cervical MRI tomorrow.  She is now on short-term disability from work and is going to apply for Social Security disability.  The patient is still having a good deal of anxiety at times.  Her husband died last year so Valentine's Day yesterday and his birthday this month admitted very difficult for her.  She has been having more anxiety but the medication has been helpful.  She is eating better and has gained back up to 125 pounds.  She would like to go  off the mirtazapine and back to trazodone because she is not sleeping as well and I think this is reasonable.  She denies any thoughts of self-harm but is just frustrated primarily with her situation.  I suggested that she get into therapy to work on some coping skills and she agrees. Visit Diagnosis:    ICD-10-CM   1. Major depressive disorder, single episode, moderate (HCC)  F32.1 sertraline (ZOLOFT) 100 MG tablet  2. Obsessive-compulsive disorder, unspecified type  F42.9 sertraline (ZOLOFT) 100 MG tablet  3. Insomnia secondary to depression with anxiety  F51.05 traZODone (DESYREL) 50 MG tablet   F41.8     Past Psychiatric History: Past psychiatric hospitalization in 2013  Past Medical History:  Past Medical History:  Diagnosis Date  . Abdominal pain, right upper quadrant 06/10/2018  . Anxiety   . Arthritis   . Bursitis   . DDD (degenerative disc disease)   . Depression   . Fibromyalgia   . Obsessive-compulsive disorder   . Overactive bladder   . 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 PTSD (post-traumatic stress disorder)   . PVC's (premature ventricular contractions)    pt. placed on heart monitor x 24hours, everything fine   . Sesamoiditis February 2017    Past Surgical History:  Procedure Laterality Date  . ABDOMINAL HYSTERECTOMY    . ESOPHAGOGASTRODUODENOSCOPY N/A 09/10/2014   Procedure: ESOPHAGOGASTRODUODENOSCOPY (EGD);  Surgeon: Rogene Houston, MD;  [facility];  Service: Endoscopy;  Laterality: N/A;  130 - moved to 3:15 -  Ann to notify  . LUMBAR FUSION    . TOOTH EXTRACTION Left Aug 2016    Family Psychiatric History: see below  Family History:  Family History  Problem Relation Age of Onset  . Cancer - Colon Mother        age 63  . Anxiety disorder Mother   . Diabetes Mother   . Atrial fibrillation Father   . Lung cancer Father   . Atrial fibrillation Brother   . Bipolar disorder Neg Hx   . Dementia Neg Hx   . Drug abuse Neg Hx   . Paranoid  behavior Neg Hx   . Schizophrenia Neg Hx   . Seizures Neg Hx   . Sexual abuse Neg Hx   . Physical abuse Neg Hx     Social History:  Social History   Socioeconomic History  . Marital status: Widowed    Spouse name: Not on file  . Number of children: 0  . Years of education: BA  . Highest education level: Not on file  Occupational History  . Occupation: [employer]- on leave (since 08/2020)  Tobacco Use  . Smoking status: Current Every Day Smoker    Packs/day: 1.00    Years: 15.00    Pack years: 15.00    Types: Cigarettes    Start date: 11/12/1996  . Smokeless tobacco: Never Used  . Tobacco comment: 1 pack a day x 20 yrs.  Vaping Use  . Vaping Use: Never used  Substance and Sexual Activity  . Alcohol use: No    Alcohol/week: 0.0 standard drinks  . Drug use: No    Types: Hydrocodone, Benzodiazepines  . Sexual activity: Not on file  Other Topics Concern  . Not on file  Social History Narrative   Right handed   2-3 glasses soda per day   Lives alone   Social Determinants of Health   Financial Resource Strain: Not on file  Food Insecurity: Not on file  Transportation Needs: Not on file  Physical Activity: Not on file  Stress: Not on file  Social Connections: Not on file    Allergies:  Allergies  Allergen Reactions  . Gabapentin Hives  . Hydrocodone-Acetaminophen Nausea Only  . Penicillins Other (See Comments)    Unknown child hood reaction .Did it involve swelling of the face/tongue/throat, SOB, or low BP? Unknown Did it involve sudden or severe rash/hives, skin peeling, or any reaction on the inside of your mouth or nose? Unknown Did you need to seek medical attention at a hospital or doctor's office? Unknown When did it last happen? If all above answers are "NO", may proceed with cephalosporin use.     Metabolic Disorder Labs: No results found for: HGBA1C, MPG No results found for: PROLACTIN No results found for: CHOL, TRIG,  HDL, CHOLHDL, VLDL, LDLCALC Lab Results  Component Value Date   TSH 1.770 10/13/2020   TSH 1.490 05/28/2016    Therapeutic Level Labs: No results found for: LITHIUM No results found for: VALPROATE No components found for:  CBMZ  Current Medications: Current Outpatient Medications  Medication Sig Dispense Refill  . aspirin 81 MG tablet Take 1 tablet (81 mg total) by mouth daily. For Polycythemia Vera.    . clonazePAM (KLONOPIN) 0.5 MG tablet Take 1 tablet (0.5 mg total) by mouth 3 (three) times daily as needed for anxiety. 90 tablet 2  . Cyanocobalamin (VITAMIN B-12 PO) Take 1 tablet by mouth daily.    Marland Kitchen dicyclomine (BENTYL) 10  MG capsule Take 1 capsule (10 mg total) by mouth every 12 (twelve) hours as needed (abdominal pain). 60 capsule 2  . mirtazapine (REMERON) 15 MG tablet Take 1 tablet (15 mg total) by mouth at bedtime. 30 tablet 2  . polyethylene glycol-electrolytes (TRILYTE) 420 g solution Take 4,000 mLs by mouth as directed. 4000 mL 0  . sertraline (ZOLOFT) 100 MG tablet TAKE 1 AND 1/2 TABLETS(150 MG) BY MOUTH DAILY 45 tablet 3  . traMADol (ULTRAM) 50 MG tablet Take 0.5 tablets (25 mg total) by mouth every 12 (twelve) hours as needed for moderate pain. 30 tablet 0  . traZODone (DESYREL) 50 MG tablet Take 1 tablet (50 mg total) by mouth at bedtime. 30 tablet 3  . VITAMIN A PO Take 1 tablet by mouth daily.     No current facility-administered medications for this visit.     Musculoskeletal: Strength & Muscle Tone: within normal limits Gait & Station: normal Patient leans: N/A  Psychiatric Specialty Exam: Review of Systems  Eyes: Positive for visual disturbance.  Psychiatric/Behavioral: Positive for sleep disturbance. The patient is nervous/anxious.   All other systems reviewed and are negative.   There were no vitals taken for this visit.There is no height or weight on file to calculate BMI.  General Appearance: Casual and Fairly Groomed  Eye Contact:  Good  Speech:   Clear and Coherent  Volume:  Normal  Mood:  Anxious  Affect:  Appropriate and Congruent  Thought Process:  Goal Directed  Orientation:  Full (Time, Place, and Person)  Thought Content: Rumination   Suicidal Thoughts:  No  Homicidal Thoughts:  No  Memory:  Immediate;   Good Recent;   Good Remote;   Good  Judgement:  Good  Insight:  Good  Psychomotor Activity:  Decreased  Concentration:  Concentration: Good and Attention Span: Good  Recall:  Good  Fund of Knowledge: Good  Language: Good  Akathisia:  No  Handed:  Right  AIMS (if indicated): not done  Assets:  Communication Skills Desire for Improvement Resilience Social Support Talents/Skills  ADL's:  Intact  Cognition: WNL  Sleep:  Fair   Screenings: PHQ2-9   [place] Office Visit from 06/10/2018 in [place] Office Visit from 06/04/2017 in [place] Office Visit from 05/28/2016 in [place] Office Visit from 07/04/2015 in [place] Visit from 06/07/2014 in [place]  PHQ-2 Total Score 1 1 0 0 0       Assessment and Plan: This patient is a 55 year old female with a history of substance abuse in remission, depression insomnia and significant visual problems.  Her anxiety is sometimes worse than others and is worse lately due to the anniversary of her husband's birthday.  I strongly suggested counseling and she agrees.  She does think her medications are helpful but would rather go back on trazodone for sleep.  She will therefore discontinue mirtazapine in favor of trazodone 50 mg at bedtime for sleep, continue Zoloft 150 mg daily for depression and Klonopin 0.5 mg up to 3 times daily for anxiety.  She will return to see me in 6-week   [name], MD 12/27/2020, 1:41 PM

## 2020-12-28 ENCOUNTER — Ambulatory Visit (HOSPITAL_COMMUNITY)
Admission: RE | Admit: 2020-12-28 | Discharge: 2020-12-28 | Disposition: A | Discharge status: Home / Self Care | Payer: BC Managed Care – PPO | Source: Ambulatory Visit | Attending: Neurology | Admitting: Neurology

## 2020-12-28 ENCOUNTER — Telehealth (HOSPITAL_COMMUNITY): Payer: Self-pay | Admitting: Psychiatry

## 2020-12-28 ENCOUNTER — Other Ambulatory Visit: Payer: Self-pay

## 2020-12-28 DIAGNOSIS — R269 Unspecified abnormalities of gait and mobility: Secondary | ICD-10-CM | POA: Diagnosis present

## 2020-12-28 DIAGNOSIS — R200 Anesthesia of skin: Secondary | ICD-10-CM | POA: Diagnosis present

## 2020-12-28 DIAGNOSIS — M5412 Radiculopathy, cervical region: Secondary | ICD-10-CM | POA: Insufficient documentation

## 2020-12-28 DIAGNOSIS — R292 Abnormal reflex: Secondary | ICD-10-CM | POA: Insufficient documentation

## 2020-12-28 IMAGING — MR MR CERVICAL SPINE W/O CM
5 series · 40 of 48 positions shown · non-contrast
Comparison: Cervical spine CT [DATE]

CLINICAL DATA: Numbness or tingling, paresthesia and ataxia.

EXAM:
MRI CERVICAL SPINE WITHOUT CONTRAST
TECHNIQUE: Multiplanar, multisequence MR imaging of the cervical spine was
performed. No intravenous contrast was administered.

[Series 5: T2 · sagittal · 3.0mm · 0.69mm/px · 6 of 15 slices shown (1 of 2)]
[im 1/15]
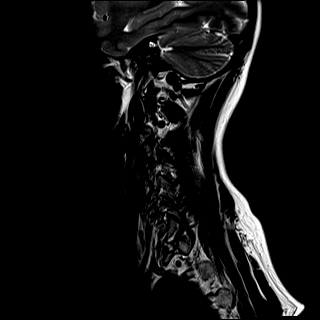
[im 3/15]
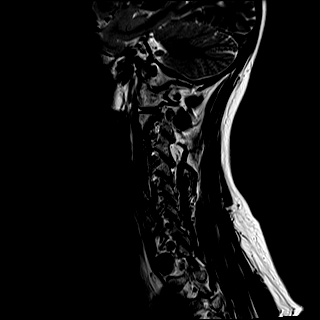
[im 6/15]
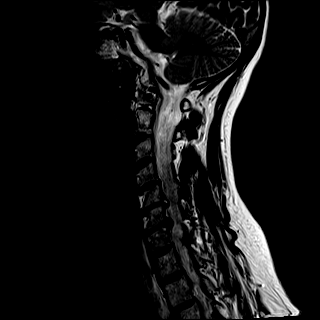
[im 9/15]
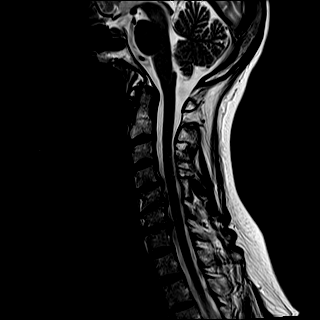
[im 12/15]
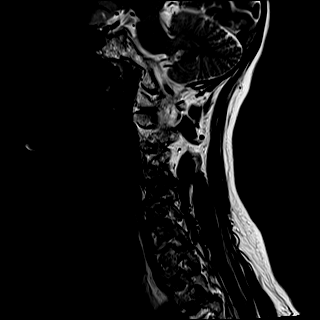
[im 15/15]
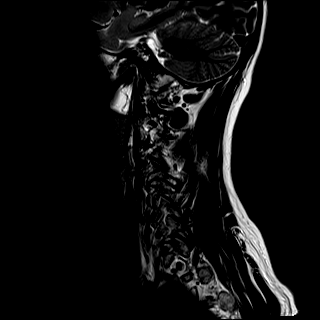

[Series 6: T1 · sagittal · 3.0mm · 0.86mm/px · 6 of 15 slices shown]
[im 1/15]
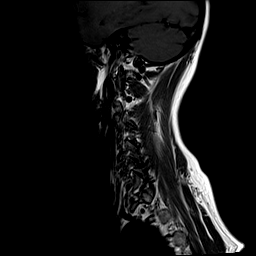
[im 3/15]
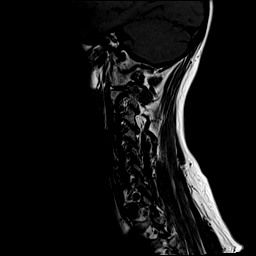
[im 6/15]
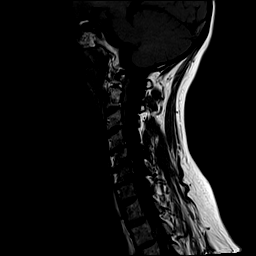
[im 9/15]
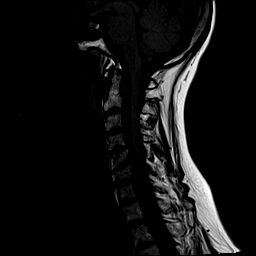
[im 12/15]
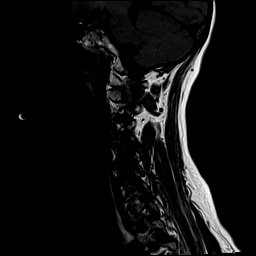
[im 15/15]
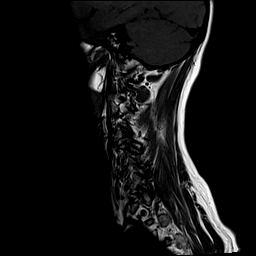

[Series 7: STIR · sagittal · 3.0mm · 0.69mm/px · 6 of 15 slices shown]
[im 1/15]
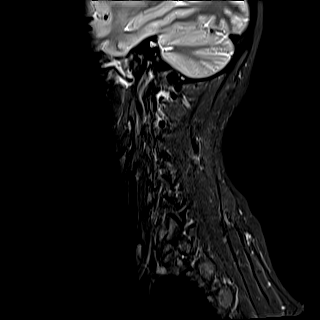
[im 3/15]
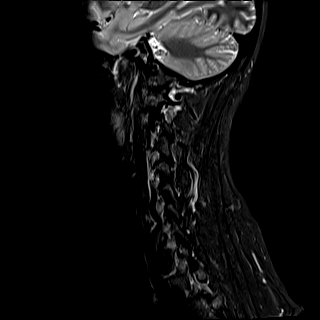
[im 6/15]
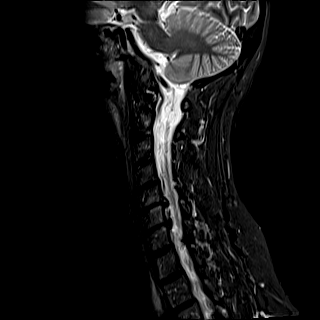
[im 9/15]
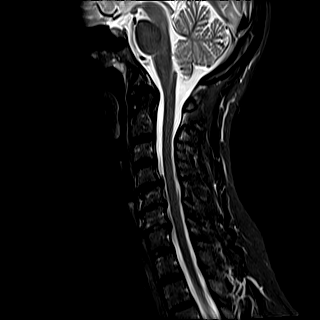
[im 12/15]
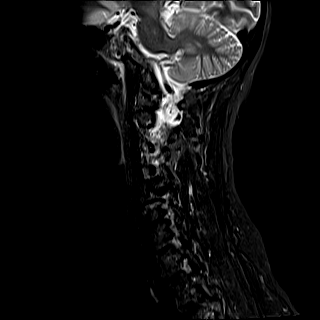
[im 15/15]
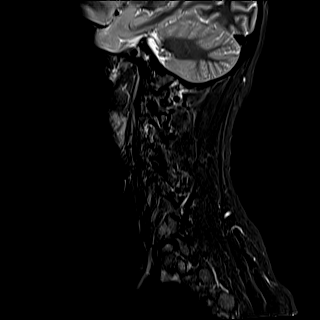

[Series 8: T2 · axial · 3.0mm · 0.70mm/px · z∈[-72,+42]mm · 14 of 36 slices shown (2 of 2)]
[im 1/36]
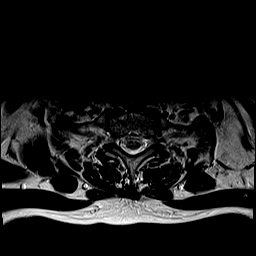
[im 3/36]
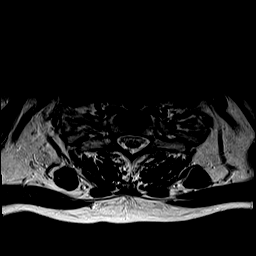
[im 6/36]
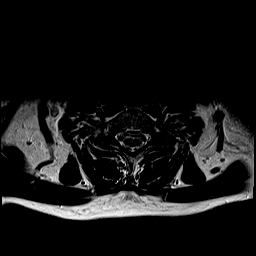
[im 8/36]
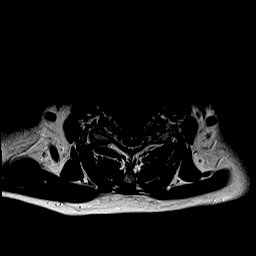
[im 11/36]
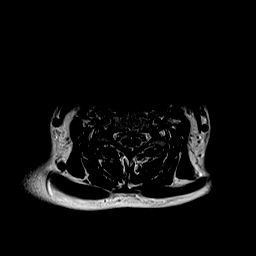
[im 13/36]
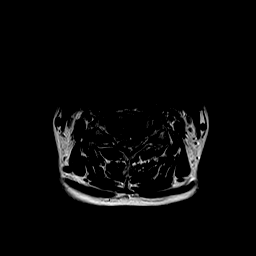
[im 16/36]
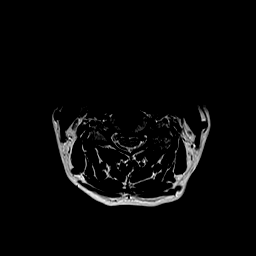
[im 18/36]
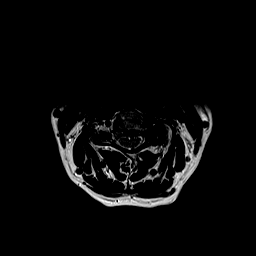
[im 21/36]
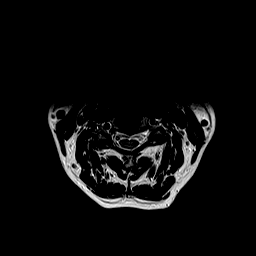
[im 23/36]
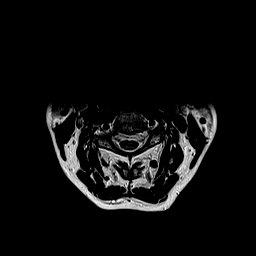
[im 26/36]
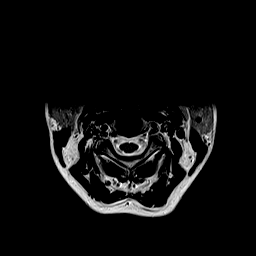
[im 28/36]
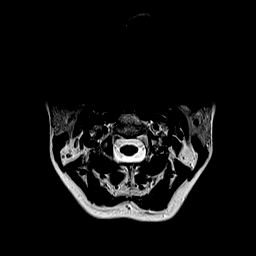
[im 31/36]
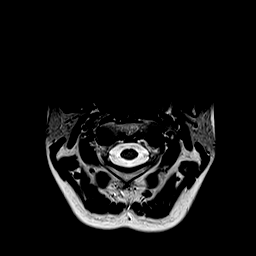
[im 36/36]
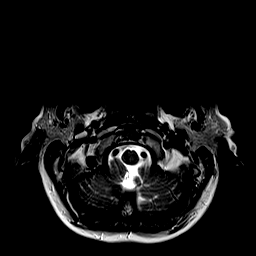

[Series 9: GRE · axial · 3.0mm · 0.35mm/px · z∈[-72,+42]mm · 8 of 36 slices shown]
[im 1/36]
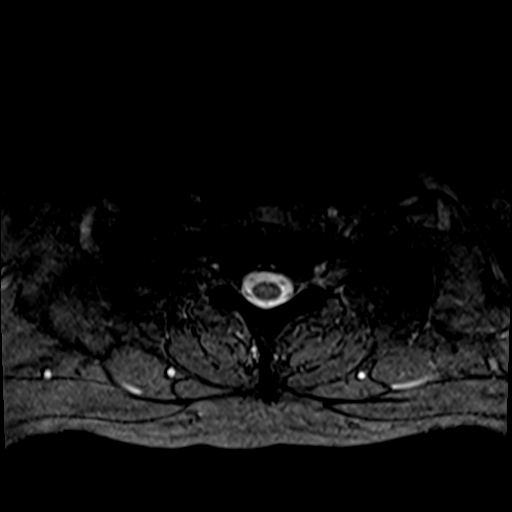
[im 6/36]
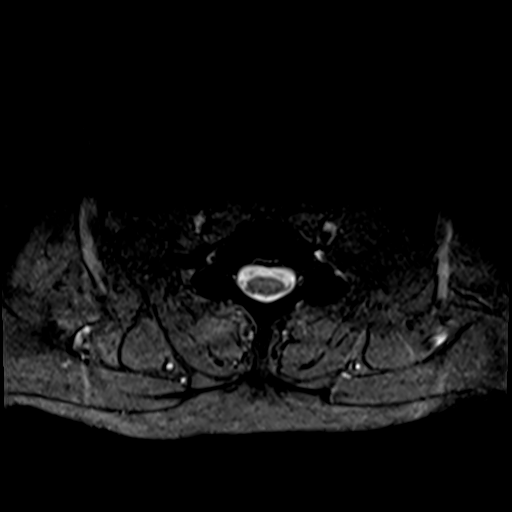
[im 11/36]
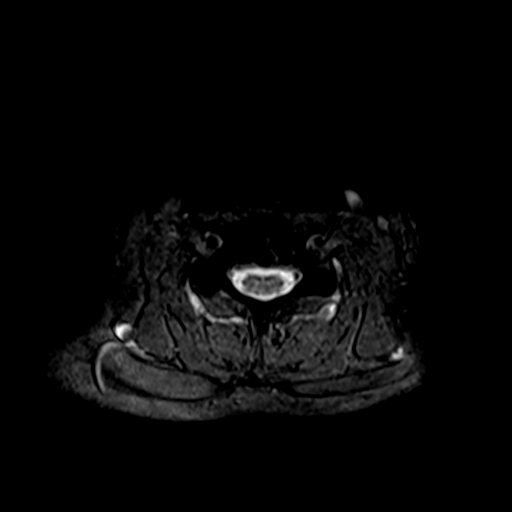
[im 16/36]
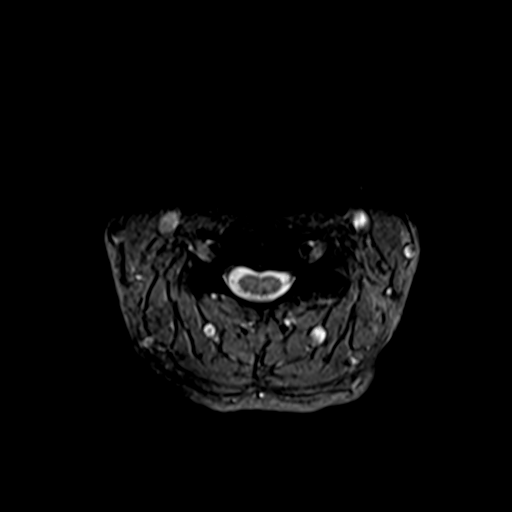
[im 21/36]
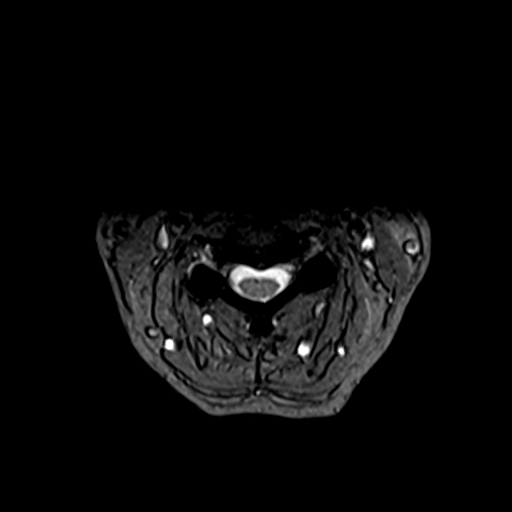
[im 26/36]
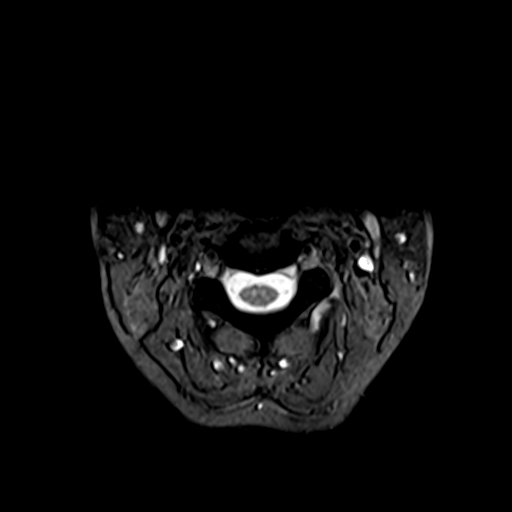
[im 31/36]
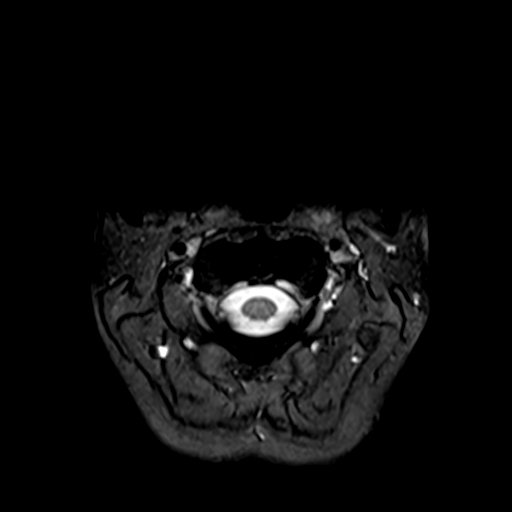
[im 36/36]
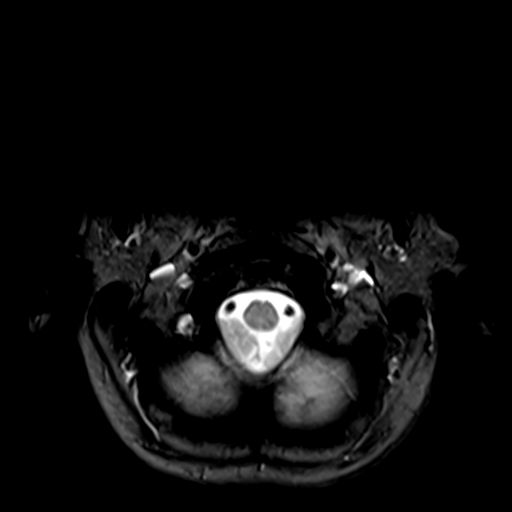

[40 of 48 positions shown; findings below may reference images not displayed]

FINDINGS: Alignment: Degenerative anterolisthesis at C4-5 measuring 3 mm,
unchanged.

Vertebrae: No fracture, evidence of discitis, or bone lesion.

Cord: Normal signal and morphology.

Posterior Fossa, vertebral arteries, paraspinal tissues: Negative.

Disc levels:

C2-3: Unremarkable.

C3-4: Tiny central disc protrusion. Mild left more than right facet
spurring.

C4-5: Facet osteoarthritis with bulky spurring on the left where
there is also greater uncovertebral ridging. High-grade left
foraminal impingement.

C5-6: Disc narrowing with endplate and uncovertebral ridging
asymmetric to the right. Facet spurring is minimal to absent. Mild
to moderate left and advanced right foraminal stenosis.

C6-7: Disc narrowing and bulging with endplate and uncovertebral
ridging. Moderate to advanced foraminal narrowing, greater on the
right.

C7-T1:Unremarkable.
IMPRESSION: 1. Normal MRI of the cord.
2. Degenerative disease primarily at C4-5 to C6-7 with
anterolisthesis at C3-4. No detected progression from [71] CT.
3. Left foraminal impingement that is advanced at C4-5 and more
moderate at C5-6 and C6-7.
4. Right foraminal impingement at C5-6 and C6-7.

## 2020-12-28 NOTE — Telephone Encounter (Signed)
I called patient 

## 2020-12-28 NOTE — Telephone Encounter (Signed)
Called to schedule f/u appt, and schedule NP appt with a therapist left voicemail

## 2020-12-29 ENCOUNTER — Encounter (HOSPITAL_COMMUNITY): Payer: BC Managed Care – PPO

## 2020-12-29 ENCOUNTER — Ambulatory Visit (HOSPITAL_COMMUNITY): Payer: BC Managed Care – PPO | Admitting: Hematology

## 2020-12-30 ENCOUNTER — Inpatient Hospital Stay (HOSPITAL_COMMUNITY): Payer: BC Managed Care – PPO | Admitting: Hematology and Oncology

## 2020-12-30 ENCOUNTER — Inpatient Hospital Stay (HOSPITAL_COMMUNITY): Payer: BC Managed Care – PPO

## 2020-12-30 ENCOUNTER — Encounter (HOSPITAL_COMMUNITY): Payer: Self-pay | Admitting: Hematology and Oncology

## 2020-12-30 ENCOUNTER — Other Ambulatory Visit: Payer: Self-pay

## 2020-12-30 VITALS — BP 95/65 | HR 76 | Temp 98.6°F | Resp 16 | Wt 123.8 lb

## 2020-12-30 DIAGNOSIS — M2550 Pain in unspecified joint: Secondary | ICD-10-CM

## 2020-12-30 DIAGNOSIS — D45 Polycythemia vera: Secondary | ICD-10-CM | POA: Diagnosis not present

## 2020-12-30 DIAGNOSIS — G629 Polyneuropathy, unspecified: Secondary | ICD-10-CM | POA: Diagnosis not present

## 2020-12-30 DIAGNOSIS — D751 Secondary polycythemia: Secondary | ICD-10-CM | POA: Diagnosis not present

## 2020-12-30 MED ORDER — TRAMADOL HCL 50 MG PO TABS: 25.0000 mg | ORAL_TABLET | Freq: Two times a day (BID) | ORAL | 0 refills | Status: DC | PRN

## 2020-12-30 NOTE — Progress Notes (Signed)
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 female returns for routine follow-up for polycythemia.   She says she has been having fatigue, night sweats and this usually indicates she needs phlebotomy. She has gained some weight back, she lost lot of weight because of social stressors Otherwise reports joint pains from PV. She needs an occasional tramadol/ No change in breathing, no change in bowel habits or urinary habits NO new neurological complaints.    REVIEW OF SYSTEMS:  Review of Systems  Constitutional: Positive for appetite change, fatigue and unexpected weight change. Negative for fever.  HENT:   Negative for nosebleeds, sore throat and trouble swallowing.   Eyes: Negative.   Respiratory: Negative.  Negative for cough, shortness of breath and wheezing.   Cardiovascular: Negative.  Negative for chest pain and leg swelling.  Gastrointestinal: Negative for abdominal pain, blood in stool, constipation, diarrhea, nausea and vomiting.  Endocrine: Negative.   Genitourinary: Negative.  Negative for bladder incontinence, hematuria and nocturia.   Musculoskeletal: Positive for myalgias. Negative for back pain and flank pain.  Skin: Negative.   Neurological: Positive for dizziness, headaches and numbness. Negative for light-headedness.  Hematological: Negative.   Psychiatric/Behavioral: Negative.  Negative for confusion. The patient is not nervous/anxious.      PAST MEDICAL/SURGICAL HISTORY:  Past Medical History:  Diagnosis Date  . Abdominal pain, right upper quadrant 06/10/2018  . Anxiety   . Arthritis   . Bursitis   . DDD (degenerative disc disease)   . Depression   .  Fibromyalgia   . Obsessive-compulsive disorder   . Overactive bladder   . 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 PTSD (post-traumatic stress disorder)   . PVC's (premature ventricular contractions)    pt. placed on heart monitor x 24hours, everything fine   . Sesamoiditis February 2017   Past Surgical History:  Procedure Laterality Date  . ABDOMINAL HYSTERECTOMY    . ESOPHAGOGASTRODUODENOSCOPY N/A 09/10/2014   Procedure: ESOPHAGOGASTRODUODENOSCOPY (EGD);  Surgeon: Rogene Houston, MD;  Location[facility];  Service: Endoscopy;  Laterality: N/A;  130 - moved to 3:15 - Ann to notify  . LUMBAR FUSION    . TOOTH EXTRACTION Left Aug 2016     SOCIAL HISTORY:  Social History   Socioeconomic History  . Marital status: Widowed    Spouse name: Not on file  . Number of children: 0  . Years of education: BA  . Highest education level: Not on file  Occupational History  . Occupation: [employer]- on leave (since 08/2020)  Tobacco Use  . Smoking status: Current Every Day Smoker    Packs/day: 1.00    Years: 15.00    Pack years: 15.00    Types: Cigarettes    Start date: 11/12/1996  . Smokeless tobacco: Never Used  . Tobacco comment: 1 pack a day x 20 yrs.  Vaping Use  . Vaping Use: Never used  Substance and Sexual Activity  . Alcohol use: No    Alcohol/week: 0.0 standard drinks  . Drug use: No    Types: Hydrocodone, Benzodiazepines  . Sexual activity: Not on file  Other Topics Concern  . Not on file  Social History Narrative   Right handed  2-3 glasses soda per day   Lives alone   Social Determinants of Health   Financial Resource Strain: Not on file  Food Insecurity: Not on file  Transportation Needs: Not on file  Physical Activity: Not on file  Stress: Not on file  Social Connections: Not on file  Intimate Partner Violence: Not on file    FAMILY HISTORY:  Family History  Problem Relation Age of Onset  . Cancer - Colon Mother        age 38   . Anxiety disorder Mother   . Diabetes Mother   . Atrial fibrillation Father   . Lung cancer Father   . Atrial fibrillation Brother   . Bipolar disorder Neg Hx   . Dementia Neg Hx   . Drug abuse Neg Hx   . Paranoid behavior Neg Hx   . Schizophrenia Neg Hx   . Seizures Neg Hx   . Sexual abuse Neg Hx   . Physical abuse Neg Hx     CURRENT MEDICATIONS:  Outpatient Encounter Medications as of 12/30/2020  Medication Sig Note  . aspirin 81 MG tablet Take 1 tablet (81 mg total) by mouth daily. For Polycythemia Vera.   . clonazePAM (KLONOPIN) 0.5 MG tablet Take 1 tablet (0.5 mg total) by mouth 3 (three) times daily as needed for anxiety.   . dicyclomine (BENTYL) 10 MG capsule Take 1 capsule (10 mg total) by mouth every 12 (twelve) hours as needed (abdominal pain).   . mirtazapine (REMERON) 15 MG tablet Take 1 tablet (15 mg total) by mouth at bedtime.   . polyethylene glycol-electrolytes (TRILYTE) 420 g solution Take 4,000 mLs by mouth as directed. 12/27/2020: Before colonoscopy  . sertraline (ZOLOFT) 100 MG tablet TAKE 1 AND 1/2 TABLETS(150 MG) BY MOUTH DAILY (Patient taking differently: Take 150 mg by mouth daily with lunch. TAKE 1 AND 1/2 TABLETS(150 MG) BY MOUTH DAILY)   . traMADol (ULTRAM) 50 MG tablet Take 0.5 tablets (25 mg total) by mouth every 12 (twelve) hours as needed for moderate pain.   . traZODone (DESYREL) 50 MG tablet Take 1 tablet (50 mg total) by mouth at bedtime. 12/27/2020: New medication patient has not started  . Vitamin A 2400 MCG (8000 UT) CAPS Take 2,400 mcg by mouth daily with lunch.   . vitamin B-12 (CYANOCOBALAMIN) 500 MCG tablet Take 500 mcg by mouth daily with lunch.    No facility-administered encounter medications on file as of 12/30/2020.    ALLERGIES:  Allergies  Allergen Reactions  . Gabapentin Hives  . Hydrocodone-Acetaminophen Nausea Only  . Penicillins Other (See Comments)    Unknown child hood reaction .Did it involve swelling of the  face/tongue/throat, SOB, or low BP? Unknown Did it involve sudden or severe rash/hives, skin peeling, or any reaction on the inside of your mouth or nose? Unknown Did you need to seek medical attention at a hospital or doctor's office? Unknown When did it last happen? If all above answers are "NO", may proceed with cephalosporin use.      PHYSICAL EXAM:  ECOG Performance status: 1  Vitals:   12/30/20 1035  BP: 95/65  Pulse: 76  Resp: 16  Temp: 98.6 F (37 C)  SpO2: 98%   Filed Weights   12/30/20 1035  Weight: 123 lb 12.8 oz (56.2 kg)    Physical Exam Constitutional:      Appearance: Normal appearance. She is normal weight.  Cardiovascular:     Rate and Rhythm: Normal rate and regular  rhythm.     Heart sounds: Normal heart sounds.  Pulmonary:     Effort: Pulmonary effort is normal.     Breath sounds: Normal breath sounds.  Abdominal:     General: Bowel sounds are normal.     Palpations: Abdomen is soft.  Musculoskeletal:        General: Normal range of motion.  Skin:    General: Skin is warm and dry.  Neurological:     Mental Status: She is alert and oriented to person, place, and time. Mental status is at baseline.  Psychiatric:        Mood and Affect: Mood normal.        Behavior: Behavior normal.        Thought Content: Thought content normal.        Judgment: Judgment normal.      LABORATORY DATA:  I have reviewed the labs as listed.  CBC    Component Value Date/Time   WBC 6.0 12/22/2020 1451   RBC 4.39 12/22/2020 1451   HGB 14.0 12/22/2020 1451   HGB 12.9 06/04/2018 0920   HGB 12.8 06/05/2013 1327   HCT 42.2 12/22/2020 1451   HCT 40.2 06/04/2018 0920   HCT 38.6 06/05/2013 1327   PLT 272 12/22/2020 1451   PLT 317 06/04/2018 0920   MCV 96.1 12/22/2020 1451   MCV 93 06/04/2018 0920   MCV 89.6 06/05/2013 1327   MCH 31.9 12/22/2020 1451   MCHC 33.2 12/22/2020 1451   RDW 12.0 12/22/2020 1451   RDW 14.4 06/04/2018 0920   RDW 14.4  06/05/2013 1327   LYMPHSABS 2.2 12/22/2020 1451   LYMPHSABS 1.8 06/04/2018 0920   LYMPHSABS 2.3 06/05/2013 1327   MONOABS 0.5 12/22/2020 1451   MONOABS 0.6 06/05/2013 1327   EOSABS 0.1 12/22/2020 1451   EOSABS 0.2 06/04/2018 0920   BASOSABS 0.0 12/22/2020 1451   BASOSABS 0.0 06/04/2018 0920   BASOSABS 0.1 06/05/2013 1327   CMP Latest Ref Rng & Units 12/22/2020 10/13/2020 08/26/2020  Glucose 70 - 99 mg/dL 99 - 79  BUN 6 - 20 mg/dL 6 - <5(L)  Creatinine 0.44 - 1.00 mg/dL 0.62 - 0.56  Sodium 135 - 145 mmol/L 133(L) - 137  Potassium 3.5 - 5.1 mmol/L 3.8 - 3.6  Chloride 98 - 111 mmol/L 98 - 104  CO2 22 - 32 mmol/L 28 - 29  Calcium 8.9 - 10.3 mg/dL 8.8(L) - 8.9  Total Protein 6.5 - 8.1 g/dL 6.8 6.4 5.8(L)  Total Bilirubin 0.3 - 1.2 mg/dL 0.4 - 0.6  Alkaline Phos 38 - 126 U/L 59 - 55  AST 15 - 41 U/L 21 - 20  ALT 0 - 44 U/L 20 - 17     I personally performed a face-to-face visit.  All questions were answered to patient's stated satisfaction. Encouraged patient to call with any new concerns or questions before his next visit to the cancer center and we can certain see him sooner, if needed.     ASSESSMENT & PLAN:   No problem-specific Assessment & Plan notes found for this encounter.     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 -Bone marrow biopsy was done on 07/12/2005 showed hypercellular marrow consistent with polycythemia. -She is a 30 pack-year active current smoker.  Does not have a history of thrombosis. -She reports her hot flashes have increased over the past 3 weeks which happens when she needs a phlebotomy.  She denies any itching after hot showers. - She has had a phlebotomy every 4 months since diagnosis.  Her symptoms improve after phlebotomies. - She continues to take aspirin 81 mg daily. - Labs from today are stable.  Hemoglobin is 14 with a hematocrit of 42. She would like to proceed with phlebotomy today since she is  having lot more fatigue and night sweats, she usually feels better after phlebotomy. --RTC in 4 months for repeat labs and possible phlebotomy.  Patient will call clinic if she develops symptoms such as hot flashes to get lab work sooner.  No mammographic evidence of malignancy.  -- Joint pains related to polycythemia Will need refill of tramadol, she uses it once a week apparently   Orders placed this encounter:  No orders of the defined types were placed in this encounter.

## 2020-12-30 NOTE — Progress Notes (Signed)
Patient here today for phlebotomy per MD orders.  Patient has seen Dr. Chryl Heck in the office today, we may proceed.  Patient's labs drawn on 12/22/20 hgb 14.0 and hct 42.2.  Procedure started at 1133 and ended at 1137.  500 ml blood removed. Needle removed intact. Pressure dressing applied with gauze and coban.  Patient observed for 30 minutes after procedure.  She tolerated without incidence.  Patient discharged ambulatory and in stable condition from clinic. She will follow up as scheduled.

## 2021-01-02 ENCOUNTER — Other Ambulatory Visit (HOSPITAL_COMMUNITY)
Admission: RE | Admit: 2021-01-02 | Discharge: 2021-01-02 | Disposition: A | Discharge status: Home / Self Care | Payer: BC Managed Care – PPO | Source: Ambulatory Visit | Attending: Gastroenterology | Admitting: Gastroenterology

## 2021-01-02 ENCOUNTER — Other Ambulatory Visit: Payer: Self-pay

## 2021-01-02 DIAGNOSIS — D120 Benign neoplasm of cecum: Secondary | ICD-10-CM | POA: Diagnosis not present

## 2021-01-02 DIAGNOSIS — Z01812 Encounter for preprocedural laboratory examination: Secondary | ICD-10-CM | POA: Insufficient documentation

## 2021-01-02 DIAGNOSIS — K449 Diaphragmatic hernia without obstruction or gangrene: Secondary | ICD-10-CM | POA: Diagnosis not present

## 2021-01-02 DIAGNOSIS — D122 Benign neoplasm of ascending colon: Secondary | ICD-10-CM | POA: Diagnosis not present

## 2021-01-02 DIAGNOSIS — D1779 Benign lipomatous neoplasm of other sites: Secondary | ICD-10-CM | POA: Diagnosis not present

## 2021-01-02 DIAGNOSIS — Z20822 Contact with and (suspected) exposure to covid-19: Secondary | ICD-10-CM | POA: Insufficient documentation

## 2021-01-02 DIAGNOSIS — Z800 Family history of malignant neoplasm of digestive organs: Secondary | ICD-10-CM | POA: Diagnosis not present

## 2021-01-02 DIAGNOSIS — K580 Irritable bowel syndrome with diarrhea: Secondary | ICD-10-CM | POA: Diagnosis not present

## 2021-01-02 DIAGNOSIS — Z801 Family history of malignant neoplasm of trachea, bronchus and lung: Secondary | ICD-10-CM | POA: Diagnosis not present

## 2021-01-02 DIAGNOSIS — Z833 Family history of diabetes mellitus: Secondary | ICD-10-CM | POA: Diagnosis not present

## 2021-01-02 DIAGNOSIS — F17210 Nicotine dependence, cigarettes, uncomplicated: Secondary | ICD-10-CM | POA: Diagnosis not present

## 2021-01-02 DIAGNOSIS — Z79899 Other long term (current) drug therapy: Secondary | ICD-10-CM | POA: Diagnosis not present

## 2021-01-02 DIAGNOSIS — Z7982 Long term (current) use of aspirin: Secondary | ICD-10-CM | POA: Diagnosis not present

## 2021-01-02 DIAGNOSIS — Z8249 Family history of ischemic heart disease and other diseases of the circulatory system: Secondary | ICD-10-CM | POA: Diagnosis not present

## 2021-01-02 DIAGNOSIS — Z880 Allergy status to penicillin: Secondary | ICD-10-CM | POA: Diagnosis not present

## 2021-01-02 DIAGNOSIS — Z885 Allergy status to narcotic agent status: Secondary | ICD-10-CM | POA: Diagnosis not present

## 2021-01-02 DIAGNOSIS — Z1211 Encounter for screening for malignant neoplasm of colon: Secondary | ICD-10-CM | POA: Diagnosis present

## 2021-01-02 DIAGNOSIS — Z888 Allergy status to other drugs, medicaments and biological substances status: Secondary | ICD-10-CM | POA: Diagnosis not present

## 2021-01-03 ENCOUNTER — Encounter (INDEPENDENT_AMBULATORY_CARE_PROVIDER_SITE_OTHER): Payer: Self-pay

## 2021-01-03 LAB — SARS CORONAVIRUS 2 (TAT 6-24 HRS): SARS Coronavirus 2: NEGATIVE

## 2021-01-04 ENCOUNTER — Ambulatory Visit (HOSPITAL_COMMUNITY): Payer: BC Managed Care – PPO | Admitting: Anesthesiology

## 2021-01-04 ENCOUNTER — Other Ambulatory Visit: Payer: Self-pay

## 2021-01-04 ENCOUNTER — Encounter (HOSPITAL_COMMUNITY): Payer: Self-pay | Admitting: Gastroenterology

## 2021-01-04 ENCOUNTER — Ambulatory Visit (HOSPITAL_COMMUNITY)
Admission: RE | Admit: 2021-01-04 | Discharge: 2021-01-04 | Disposition: A | Discharge status: Home / Self Care | Payer: BC Managed Care – PPO | Attending: Gastroenterology | Admitting: Gastroenterology

## 2021-01-04 ENCOUNTER — Encounter (HOSPITAL_COMMUNITY)
Admission: RE | Disposition: A | Discharge status: Home / Self Care | Payer: Self-pay | Source: Home / Self Care | Attending: Gastroenterology

## 2021-01-04 DIAGNOSIS — D175 Benign lipomatous neoplasm of intra-abdominal organs: Secondary | ICD-10-CM | POA: Diagnosis not present

## 2021-01-04 DIAGNOSIS — Z8249 Family history of ischemic heart disease and other diseases of the circulatory system: Secondary | ICD-10-CM | POA: Insufficient documentation

## 2021-01-04 DIAGNOSIS — Z7982 Long term (current) use of aspirin: Secondary | ICD-10-CM | POA: Insufficient documentation

## 2021-01-04 DIAGNOSIS — Z1211 Encounter for screening for malignant neoplasm of colon: Secondary | ICD-10-CM | POA: Diagnosis not present

## 2021-01-04 DIAGNOSIS — Z20822 Contact with and (suspected) exposure to covid-19: Secondary | ICD-10-CM | POA: Insufficient documentation

## 2021-01-04 DIAGNOSIS — D120 Benign neoplasm of cecum: Secondary | ICD-10-CM

## 2021-01-04 DIAGNOSIS — Z885 Allergy status to narcotic agent status: Secondary | ICD-10-CM | POA: Insufficient documentation

## 2021-01-04 DIAGNOSIS — D122 Benign neoplasm of ascending colon: Secondary | ICD-10-CM | POA: Diagnosis not present

## 2021-01-04 DIAGNOSIS — Z888 Allergy status to other drugs, medicaments and biological substances status: Secondary | ICD-10-CM | POA: Insufficient documentation

## 2021-01-04 DIAGNOSIS — R109 Unspecified abdominal pain: Secondary | ICD-10-CM | POA: Diagnosis not present

## 2021-01-04 DIAGNOSIS — K449 Diaphragmatic hernia without obstruction or gangrene: Secondary | ICD-10-CM

## 2021-01-04 DIAGNOSIS — Z801 Family history of malignant neoplasm of trachea, bronchus and lung: Secondary | ICD-10-CM | POA: Insufficient documentation

## 2021-01-04 DIAGNOSIS — F17210 Nicotine dependence, cigarettes, uncomplicated: Secondary | ICD-10-CM | POA: Insufficient documentation

## 2021-01-04 DIAGNOSIS — Z880 Allergy status to penicillin: Secondary | ICD-10-CM | POA: Insufficient documentation

## 2021-01-04 DIAGNOSIS — Z79899 Other long term (current) drug therapy: Secondary | ICD-10-CM | POA: Insufficient documentation

## 2021-01-04 DIAGNOSIS — D1779 Benign lipomatous neoplasm of other sites: Secondary | ICD-10-CM | POA: Insufficient documentation

## 2021-01-04 DIAGNOSIS — K580 Irritable bowel syndrome with diarrhea: Secondary | ICD-10-CM | POA: Insufficient documentation

## 2021-01-04 DIAGNOSIS — Z800 Family history of malignant neoplasm of digestive organs: Secondary | ICD-10-CM | POA: Insufficient documentation

## 2021-01-04 DIAGNOSIS — Z833 Family history of diabetes mellitus: Secondary | ICD-10-CM | POA: Insufficient documentation

## 2021-01-04 HISTORY — PX: COLONOSCOPY WITH PROPOFOL: SHX5780

## 2021-01-04 HISTORY — PX: POLYPECTOMY: SHX149

## 2021-01-04 HISTORY — PX: BIOPSY: SHX5522

## 2021-01-04 HISTORY — PX: ESOPHAGOGASTRODUODENOSCOPY (EGD) WITH PROPOFOL: SHX5813

## 2021-01-04 LAB — HM COLONOSCOPY

## 2021-01-04 SURGERY — COLONOSCOPY WITH PROPOFOL
Anesthesia: General

## 2021-01-04 MED ORDER — LACTATED RINGERS IV SOLN: INTRAVENOUS | Status: DC

## 2021-01-04 MED ORDER — PROPOFOL 10 MG/ML IV BOLUS
INTRAVENOUS | Status: DC | PRN
  Administered 2021-01-04: 30 mg via INTRAVENOUS
  Administered 2021-01-04: 50 mg via INTRAVENOUS
  Administered 2021-01-04: 30 mg via INTRAVENOUS
  Administered 2021-01-04: 50 mg via INTRAVENOUS
  Administered 2021-01-04 (×4): 20 mg via INTRAVENOUS
  Administered 2021-01-04: 30 mg via INTRAVENOUS
  Administered 2021-01-04: 20 mg via INTRAVENOUS

## 2021-01-04 MED ORDER — STERILE WATER FOR IRRIGATION IR SOLN
Status: DC | PRN
  Administered 2021-01-04: 200 mL

## 2021-01-04 MED ORDER — PROPOFOL 10 MG/ML IV BOLUS
INTRAVENOUS | Status: AC
  Filled 2021-01-04: qty 60

## 2021-01-04 MED ORDER — LIDOCAINE HCL (PF) 2 % IJ SOLN
INTRAMUSCULAR | Status: AC
  Filled 2021-01-04: qty 10

## 2021-01-04 MED ORDER — PROPOFOL 10 MG/ML IV BOLUS
INTRAVENOUS | Status: AC
  Filled 2021-01-04: qty 20

## 2021-01-04 MED ORDER — PROPOFOL 500 MG/50ML IV EMUL
INTRAVENOUS | Status: DC | PRN
  Administered 2021-01-04: 150 ug/kg/min via INTRAVENOUS

## 2021-01-04 NOTE — Op Note (Signed)
[facility] Patient Name: Daysie Helf Procedure Date: 01/04/2021 12:00 PM MRN: [id number] Date of Birth: 1966-06-18 Attending MD: Maylon Peppers ,  CSN: [id number] Age: 55 Admit Type: Outpatient Procedure:                Colonoscopy Indications:              Screening for colorectal malignant neoplasm Providers:                Maylon Peppers, Crystal Page, Nelma Rothman,                            Technician Referring MD:              Medicines:                Monitored Anesthesia Care Complications:            No immediate complications. Estimated Blood Loss:     Estimated blood loss: none. Procedure:                Pre-Anesthesia Assessment:                           - Prior to the procedure, a History and Physical                            was performed, and patient medications, allergies                            and sensitivities were reviewed. The patient's                            tolerance of previous anesthesia was reviewed.                           - The risks and benefits of the procedure and the                            sedation options and risks were discussed with the                            patient. All questions were answered and informed                            consent was obtained.                           - ASA Grade Assessment: II - A patient with mild                            systemic disease.                           After obtaining informed consent, the colonoscope                            was passed under direct vision. Throughout the  procedure, the patient's blood pressure, pulse, and                            oxygen saturations were monitored continuously. The                            [id number]) was introduced through the anus                            and advanced to the the terminal ileum. The                            colonoscopy was performed without difficulty. The                             patient tolerated the procedure well. The quality                            of the bowel preparation was good. Scope withdrawal                            time was 16 minutes. Scope In: 12:03:34 PM Scope Out: 12:28:27 PM Scope Withdrawal Time: 0 hours 22 minutes 5 seconds  Total Procedure Duration: 0 hours 24 minutes 53 seconds  Findings:      The perianal and digital rectal examinations were normal.      The terminal ileum appeared normal.      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       This was located contralateral to the IC valve.      A 5 mm polyp was found in the cecum. The polyp was carpet-like. Area was       successfully injected with 1 mL Eleview for a lift polypectomy. The       polyp was removed with a cold snare. Resection and retrieval were       complete.      Two flat clustered polyps were found in the ascending colon. The polyps       were 2 to 5 mm in size. Area was successfully injected with 1 mL Eleview       for a lift polypectomy. These polyps were removed with a cold snare.       Resection and retrieval were complete.      The retroflexed view of the distal rectum and anal verge was normal and       showed no anal or rectal abnormalities. Impression:               - The examined portion of the ileum was normal.                           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                           - One 5 mm polyp in the cecum, removed with a cold  snare. Resected and retrieved. Injected.                           - Two clustered 2 to 5 mm polyps in the ascending                            colon, removed with a cold snare. Resected and                            retrieved. Injected.                           - The distal rectum and anal verge are normal on                            retroflexion view. Moderate Sedation:      Per Anesthesia Care Recommendation:           - Discharge patient  to home (ambulatory).                           - Resume previous diet.                           - Await pathology results.                           - Repeat colonoscopy for surveillance based on                            pathology results. Procedure Code(s):        --- Professional ---                           [phone], Colonoscopy, flexible; with removal of                            tumor(s), polyp(s), or other lesion(s) by snare                            technique                           [id number], Colonoscopy, flexible; with directed                            submucosal injection(s), any substance Diagnosis Code(s):        --- Professional ---                           K63.5, Polyp of colon                           Z12.11, Encounter for screening for malignant                            neoplasm of colon CPT copyright 2019 [employer]. All  rights reserved. The codes documented in this report are preliminary and upon coder review may  be revised to meet current compliance requirements. Maylon Peppers, MD Maylon Peppers,  01/04/2021 12:35:27 PM This report has been signed electronically. Number of Addenda: 0

## 2021-01-04 NOTE — Interval H&P Note (Signed)
History and Physical Interval Note:  01/04/2021 9:46 AM  Cheryl Cross is a 55 y.o. female with PMH IBS-M, anxiety, depression, fibromyalgia, OCD, PTSD, who presents for evaluation of abdominal pain and colorectal cancer screening.  Patient has presented intermittent episodes of right upper quadrant abdominal pain for the last 2 years which happened up to 4 times per week. Has not been taking anything for the pain and it is self-limited in nature. Has had episodes of diarrhea and constipation which have alternated through time. Has had HIDA scan and abdominal ultrasound that were unremarkable in the last year. Last EGD was performed in 2011 which was normal.  Last colonoscopy was performed in 2011. Random biopsies were performed and it was negative for microscopic colitis. Family history is unremarkable for colon cancer in her mother at age 85.  There were no vitals taken for this visit. GENERAL: The patient is AO x3, in no acute distress. HEENT: Head is normocephalic and atraumatic. EOMI are intact. Mouth is well hydrated and without lesions. NECK: Supple. No masses LUNGS: Clear to auscultation. No presence of rhonchi/wheezing/rales. Adequate chest expansion HEART: RRR, normal s1 and s2. ABDOMEN: Soft, nontender, no guarding, no peritoneal signs, and nondistended. BS +. No masses. EXTREMITIES: Without any cyanosis, clubbing, rash, lesions or edema. NEUROLOGIC: AOx3, no focal motor deficit. SKIN: no jaundice, no rashes  Cheryl Cross  has presented today for surgery, with the diagnosis of Screening Colonoscopy Abdominal pain.  The various methods of treatment have been discussed with the patient and family. After consideration of risks, benefits and other options for treatment, the patient has consented to  Procedure(s) with comments: COLONOSCOPY WITH PROPOFOL (N/A) - 1115 ESOPHAGOGASTRODUODENOSCOPY (EGD) WITH PROPOFOL (N/A) as a surgical intervention.  The patient's history has been  reviewed, patient examined, no change in status, stable for surgery.  I have reviewed the patient's chart and labs.  Questions were answered to the patient's satisfaction.     Cheryl Cross

## 2021-01-04 NOTE — Anesthesia Preprocedure Evaluation (Signed)
Anesthesia Evaluation  Patient identified by MRN, date of birth, ID band Patient awake    Reviewed: Allergy & Precautions, H&P , NPO status , Patient's Chart, lab work & pertinent test results, reviewed documented beta blocker date and time   Airway Mallampati: II  TM Distance: >3 FB Neck ROM: full    Dental no notable dental hx.    Pulmonary neg pulmonary ROS, Current Smoker,    Pulmonary exam normal breath sounds clear to auscultation       Cardiovascular Exercise Tolerance: Good negative cardio ROS   Rhythm:regular Rate:Normal     Neuro/Psych PSYCHIATRIC DISORDERS Anxiety Depression  Neuromuscular disease    GI/Hepatic negative GI ROS, Neg liver ROS,   Endo/Other  negative endocrine ROS  Renal/GU negative Renal ROS  negative genitourinary   Musculoskeletal   Abdominal   Peds  Hematology negative hematology ROS (+)   Anesthesia Other Findings   Reproductive/Obstetrics negative OB ROS                             Anesthesia Physical Anesthesia Plan  ASA: II  Anesthesia Plan: General   Post-op Pain Management:    Induction:   PONV Risk Score and Plan: Propofol infusion  Airway Management Planned:   Additional Equipment:   Intra-op Plan:   Post-operative Plan:   Informed Consent: I have reviewed the patients History and Physical, chart, labs and discussed the procedure including the risks, benefits and alternatives for the proposed anesthesia with the patient or authorized representative who has indicated his/her understanding and acceptance.     Dental Advisory Given  Plan Discussed with: CRNA  Anesthesia Plan Comments:         Anesthesia Quick Evaluation

## 2021-01-04 NOTE — Anesthesia Postprocedure Evaluation (Signed)
Anesthesia Post Note  Patient: Cheryl Cross  Procedure(s) Performed: COLONOSCOPY WITH PROPOFOL (N/A ) ESOPHAGOGASTRODUODENOSCOPY (EGD) WITH PROPOFOL (N/A ) BIOPSY POLYPECTOMY INTESTINAL  Patient location during evaluation: Endoscopy Anesthesia Type: General Level of consciousness: awake and alert Pain management: pain level controlled Vital Signs Assessment: post-procedure vital signs reviewed and stable Respiratory status: spontaneous breathing Cardiovascular status: blood pressure returned to baseline and stable Postop Assessment: no apparent nausea or vomiting Anesthetic complications: no   No complications documented.   Last Vitals:  Vitals:   01/04/21 0956 01/04/21 1234  BP: (!) 104/59 95/66  Pulse: 71 74  Resp: 16 15  Temp: 36.9 C (!) 36.3 C  SpO2: 100% 100%    Last Pain:  Vitals:   01/04/21 1234  TempSrc: Oral  PainSc: 0-No pain                 Jaiyana Canale

## 2021-01-04 NOTE — Discharge Instructions (Signed)
Upper Endoscopy, Adult, Care After This sheet gives you information about how to care for yourself after your procedure. Your health care provider may also give you more specific instructions. If you have problems or questions, contact your health care provider. What can I expect after the procedure? After the procedure, it is common to have:  A sore throat.  Mild stomach pain or discomfort.  Bloating.  Nausea. Follow these instructions at home:  Follow instructions from your health care provider about what to eat or drink after your procedure.  Return to your normal activities as told by your health care provider. Ask your health care provider what activities are safe for you.  Take over-the-counter and prescription medicines only as told by your health care provider.  If you were given a sedative during the procedure, it can affect you for several hours. Do not drive or operate machinery until your health care provider says that it is safe.  Keep all follow-up visits as told by your health care provider. This is important.   Contact a health care provider if you have:  A sore throat that lasts longer than one day.  Trouble swallowing. Get help right away if:  You vomit blood or your vomit looks like coffee grounds.  You have: ? A fever. ? Bloody, black, or tarry stools. ? A severe sore throat or you cannot swallow. ? Difficulty breathing. ? Severe pain in your chest or abdomen. Summary  After the procedure, it is common to have a sore throat, mild stomach discomfort, bloating, and nausea.  If you were given a sedative during the procedure, it can affect you for several hours. Do not drive or operate machinery until your health care provider says that it is safe.  Follow instructions from your health care provider about what to eat or drink after your procedure.  Return to your normal activities as told by your health care provider. This information is not intended to  replace advice given to you by your health care provider. Make sure you discuss any questions you have with your health care provider. Document Revised: 10/27/2019 Document Reviewed: 03/31/2018 Elsevier Patient Education  2021 [place].   Colonoscopy, Adult, Care After This sheet gives you information about how to care for yourself after your procedure. Your doctor may also give you more specific instructions. If you have problems or questions, call your doctor. What can I expect after the procedure? After the procedure, it is common to have:  A small amount of blood in your poop (stool) for 24 hours.  Some gas.  Mild cramping or bloating in your belly (abdomen). Follow these instructions at home: Eating and drinking  Drink enough fluid to keep your pee (urine) pale yellow.  Follow instructions from your doctor about what you cannot eat or drink.  Return to your normal diet as told by your doctor. Avoid heavy or fried foods that are hard to digest.   Activity  Rest as told by your doctor.  Do not sit for a long time without moving. Get up to take short walks every 1-2 hours. This is important. Ask for help if you feel weak or unsteady.  Return to your normal activities as told by your doctor. Ask your doctor what activities are safe for you. To help cramping and bloating:  Try walking around.  Put heat on your belly as told by your doctor. Use the heat source that your doctor recommends, such as a moist heat pack  or a heating pad. ? Put a towel between your skin and the heat source. ? Leave the heat on for 20-30 minutes. ? Remove the heat if your skin turns bright red. This is very important if you are unable to feel pain, heat, or cold. You may have a greater risk of getting burned.   General instructions  If you were given a medicine to help you relax (sedative) during your procedure, it can affect you for many hours. Do not drive or use machinery until your doctor says  that it is safe.  For the first 24 hours after the procedure: ? Do not sign important documents. ? Do not drink alcohol. ? Do your daily activities more slowly than normal. ? Eat foods that are soft and easy to digest.  Take over-the-counter or prescription medicines only as told by your doctor.  Keep all follow-up visits as told by your doctor. This is important. Contact a doctor if:  You have blood in your poop 2-3 days after the procedure. Get help right away if:  You have more than a small amount of blood in your poop.  You see large clumps of tissue (blood clots) in your poop.  Your belly is swollen.  You feel like you may vomit (nauseous).  You vomit.  You have a fever.  You have belly pain that gets worse, and medicine does not help your pain. Summary  After the procedure, it is common to have a small amount of blood in your poop. You may also have mild cramping and bloating in your belly.  If you were given a medicine to help you relax (sedative) during your procedure, it can affect you for many hours. Do not drive or use machinery until your doctor says that it is safe.  Get help right away if you have a lot of blood in your poop, feel like you may vomit, have a fever, or have more belly pain. This information is not intended to replace advice given to you by your health care provider. Make sure you discuss any questions you have with your health care provider. Document Revised: 09/04/2019 Document Reviewed: 05/25/2019 Elsevier Patient Education  2021 [place] are being discharged to home.  Low FODMAP diet.  We are waiting for your pathology results.  Your physician has recommended a repeat colonoscopy for surveillance based on pathology results.    Low-FODMAP Eating Plan  FODMAP stands for fermentable oligosaccharides, disaccharides, monosaccharides, and polyols. These are sugars that are hard for some people to digest. A low-FODMAP eating plan  may help some people who have irritable bowel syndrome (IBS) and certain other bowel (intestinal) diseases to manage their symptoms. This meal plan can be complicated to follow. Work with a diet and nutrition specialist (dietitian) to make a low-FODMAP eating plan that is right for you. A dietitian can help make sure that you get enough nutrition from this diet. What are tips for following this plan? Reading food labels  Check labels for hidden FODMAPs such as: ? High-fructose syrup. ? Honey. ? Agave. ? Natural fruit flavors. ? Onion or garlic powder.  Choose low-FODMAP foods that contain 3-4 grams of fiber per serving.  Check food labels for serving sizes. Eat only one serving at a time to make sure FODMAP levels stay low. Shopping  Shop with a list of foods that are recommended on this diet and make a meal plan. Meal planning  Follow a low-FODMAP eating plan for  up to 6 weeks, or as told by your health care provider or dietitian.  To follow the eating plan: 1. Eliminate high-FODMAP foods from your diet completely. Choose only low-FODMAP foods to eat. You will do this for 2-6 weeks. 2. Gradually reintroduce high-FODMAP foods into your diet one at a time. Most people should wait a few days before introducing the next new high-FODMAP food into their meal plan. Your dietitian can recommend how quickly you may reintroduce foods. 3. Keep a daily record of what and how much you eat and drink. Make note of any symptoms that you have after eating. 4. Review your daily record with a dietitian regularly to identify which foods you can eat and which foods you should avoid. General tips  Drink enough fluid each day to keep your urine pale yellow.  Avoid processed foods. These often have added sugar and may be high in FODMAPs.  Avoid most dairy products, whole grains, and sweeteners.  Work with a dietitian to make sure you get enough fiber in your diet.  Avoid high FODMAP foods at meals to  manage symptoms. Recommended foods Fruits Bananas, oranges, tangerines, lemons, limes, blueberries, raspberries, strawberries, grapes, cantaloupe, honeydew melon, kiwi, papaya, passion fruit, and pineapple. Limited amounts of dried cranberries, banana chips, and shredded coconut. Vegetables Eggplant, zucchini, cucumber, peppers, green beans, bean sprouts, lettuce, arugula, kale, Swiss chard, spinach, collard greens, bok choy, summer squash, potato, and tomato. Limited amounts of corn, carrot, and sweet potato. Green parts of scallions. Grains Gluten-free grains, such as rice, oats, buckwheat, quinoa, corn, polenta, and millet. Gluten-free pasta, bread, or cereal. Rice noodles. Corn tortillas. Meats and other proteins Unseasoned beef, pork, poultry, or fish. Eggs. Berniece Salines. Tofu (firm) and tempeh. Limited amounts of nuts and seeds, such as almonds, walnuts, [place] nuts, pecans, peanuts, nut butters, pumpkin seeds, chia seeds, and sunflower seeds. Dairy Lactose-free milk, yogurt, and kefir. Lactose-free cottage cheese and ice cream. Non-dairy milks, such as almond, coconut, hemp, and rice milk. Non-dairy yogurt. Limited amounts of goat cheese, brie, mozzarella, parmesan, swiss, and other hard cheeses. Fats and oils Butter-free spreads. Vegetable oils, such as olive, canola, and sunflower oil. Seasoning and other foods Artificial sweeteners with names that do not end in "ol," such as aspartame, saccharine, and stevia. Maple syrup, white table sugar, raw sugar, brown sugar, and molasses. Mayonnaise, soy sauce, and tamari. Fresh basil, coriander, parsley, rosemary, and thyme. Beverages Water and mineral water. Sugar-sweetened soft drinks. Small amounts of orange juice or cranberry juice. Black and green tea. Most dry wines. Coffee. The items listed above may not be a complete list of foods and beverages you can eat. Contact a dietitian for more information. Foods to avoid Fruits Fresh, dried, and juiced  forms of apple, pear, watermelon, peach, plum, cherries, apricots, blackberries, boysenberries, figs, nectarines, and mango. Avocado. Vegetables Chicory root, artichoke, asparagus, cabbage, snow peas, Brussels sprouts, broccoli, sugar snap peas, mushrooms, celery, and cauliflower. Onions, garlic, leeks, and the white part of scallions. Grains Wheat, including kamut, durum, and semolina. Barley and bulgur. Couscous. Wheat-based cereals. Wheat noodles, bread, crackers, and pastries. Meats and other proteins Fried or fatty meat. Sausage. Cashews and pistachios. Soybeans, baked beans, black beans, chickpeas, kidney beans, fava beans, navy beans, lentils, black-eyed peas, and split peas. Dairy Milk, yogurt, ice cream, and soft cheese. Cream and sour cream. Milk-based sauces. Custard. Buttermilk. Soy milk. Seasoning and other foods Any sugar-free gum or candy. Foods that contain artificial sweeteners such as sorbitol, mannitol, isomalt, or xylitol.  Foods that contain honey, high-fructose corn syrup, or agave. Bouillon, vegetable stock, beef stock, and chicken stock. Garlic and onion powder. Condiments made with onion, such as hummus, chutney, pickles, relish, salad dressing, and salsa. Tomato paste. Beverages Chicory-based drinks. Coffee substitutes. Chamomile tea. Fennel tea. Sweet or fortified wines such as port or sherry. Diet soft drinks made with isomalt, mannitol, maltitol, sorbitol, or xylitol. Apple, pear, and mango juice. Juices with high-fructose corn syrup. The items listed above may not be a complete list of foods and beverages you should avoid. Contact a dietitian for more information. Summary  FODMAP stands for fermentable oligosaccharides, disaccharides, monosaccharides, and polyols. These are sugars that are hard for some people to digest.  A low-FODMAP eating plan is a short-term diet that helps to ease symptoms of certain bowel diseases.  The eating plan usually lasts up to 6 weeks.  After that, high-FODMAP foods are reintroduced gradually and one at a time. This can help you find out which foods may be causing symptoms.  A low-FODMAP eating plan can be complicated. It is best to work with a dietitian who has experience with this type of plan. This information is not intended to replace advice given to you by your health care provider. Make sure you discuss any questions you have with your health care provider. Document Revised: 03/17/2020 Document Reviewed: 03/17/2020 Elsevier Patient Education  [place].  Colon Polyps  Colon polyps are tissue growths inside the colon, which is part of the large intestine. They are one of the types of polyps that can grow in the body. A polyp may be a round bump or a mushroom-shaped growth. You could have one polyp or more than one. Most colon polyps are noncancerous (benign). However, some colon polyps can become cancerous over time. Finding and removing the polyps early can help prevent this. What are the causes? The exact cause of colon polyps is not known. What increases the risk? The following factors may make you more likely to develop this condition:  Having a family history of colorectal cancer or colon polyps.  Being older than 54 years of age.  Being younger than 54 years of age and having a significant family history of colorectal cancer or colon polyps or a genetic condition that puts you at higher risk of getting colon polyps.  Having inflammatory bowel disease, such as ulcerative colitis or Crohn's disease.  Having certain conditions passed from parent to child (hereditary conditions), such as: ? Familial adenomatous polyposis (FAP). ? Lynch syndrome. ? Turcot syndrome. ? Peutz-Jeghers syndrome. ? MUTYH-associated polyposis (MAP).  Being overweight.  Certain lifestyle factors. These include smoking cigarettes, drinking too much alcohol, not getting enough exercise, and eating a diet that is high in fat  and red meat and low in fiber.  Having had childhood cancer that was treated with radiation of the abdomen. What are the signs or symptoms? Many times, there are no symptoms. If you have symptoms, they may include:  Blood coming from the rectum during a bowel movement.  Blood in the stool (feces). The blood may be bright red or very dark in color.  Pain in the abdomen.  A change in bowel habits, such as constipation or diarrhea. How is this diagnosed? This condition is diagnosed with a colonoscopy. This is a procedure in which a lighted, flexible scope is inserted into the opening between the buttocks (anus) and then passed into the colon to examine the area. Polyps are sometimes found when a  colonoscopy is done as part of routine cancer screening tests. How is this treated? This condition is treated by removing any polyps that are found. Most polyps can be removed during a colonoscopy. Those polyps will then be tested for cancer. Additional treatment may be needed depending on the results of testing. Follow these instructions at home: Eating and drinking  Eat foods that are high in fiber, such as fruits, vegetables, and whole grains.  Eat foods that are high in calcium and vitamin D, such as milk, cheese, yogurt, eggs, liver, fish, and broccoli.  Limit foods that are high in fat, such as fried foods and desserts.  Limit the amount of red meat, precooked or cured meat, or other processed meat that you eat, such as hot dogs, sausages, bacon, or meat loaves.  Limit sugary drinks.   Lifestyle  Maintain a healthy weight, or lose weight if recommended by your health care provider.  Exercise every day or as told by your health care provider.  Do not use any products that contain nicotine or tobacco, such as cigarettes, e-cigarettes, and chewing tobacco. If you need help quitting, ask your health care provider.  Do not drink alcohol if: ? Your health care provider tells you not to  drink. ? You are pregnant, may be pregnant, or are planning to become pregnant.  If you drink alcohol: ? Limit how much you use to:  0-1 drink a day for women.  0-2 drinks a day for men. ? Know how much alcohol is in your drink. In the [place] one drink equals one 12 oz bottle of beer (355 mL), one 5 oz glass of wine (148 mL), or one 1 oz glass of hard liquor (44 mL). General instructions  Take over-the-counter and prescription medicines only as told by your health care provider.  Keep all follow-up visits. This is important. This includes having regularly scheduled colonoscopies. Talk to your health care provider about when you need a colonoscopy. Contact a health care provider if:  You have new or worsening bleeding during a bowel movement.  You have new or increased blood in your stool.  You have a change in bowel habits.  You lose weight for no known reason. Summary  Colon polyps are tissue growths inside the colon, which is part of the large intestine. They are one type of polyp that can grow in the body.  Most colon polyps are noncancerous (benign), but some can become cancerous over time.  This condition is diagnosed with a colonoscopy.  This condition is treated by removing any polyps that are found. Most polyps can be removed during a colonoscopy. This information is not intended to replace advice given to you by your health care provider. Make sure you discuss any questions you have with your health care provider. Document Revised: 02/17/2020 Document Reviewed: 02/17/2020 Elsevier Patient Education  2021 [employer].

## 2021-01-04 NOTE — Transfer of Care (Signed)
Immediate Anesthesia Transfer of Care Note  Patient: Waver Dibiasio Kneisel  Procedure(s) Performed: COLONOSCOPY WITH PROPOFOL (N/A ) ESOPHAGOGASTRODUODENOSCOPY (EGD) WITH PROPOFOL (N/A ) BIOPSY POLYPECTOMY INTESTINAL  Patient Location: PACU  Anesthesia Type:General  Level of Consciousness: awake  Airway & Oxygen Therapy: Patient Spontanous Breathing  Post-op Assessment: Report given to RN  Post vital signs: Reviewed and stable  Last Vitals:  Vitals Value Taken Time  BP 95/66 01/04/21 1234  Temp 36.3 C 01/04/21 1234  Pulse 74 01/04/21 1234  Resp 15 01/04/21 1234  SpO2 100 % 01/04/21 1234    Last Pain:  Vitals:   01/04/21 1234  TempSrc: Oral  PainSc: 0-No pain      Patients Stated Pain Goal: 7 (79/03/83 [id number])  Complications: No complications documented.

## 2021-01-04 NOTE — Op Note (Signed)
[facility] Patient Name: Cheryl Cross Procedure Date: 01/04/2021 11:31 AM MRN: [id number] Date of Birth: 27-Jun-1966 Attending MD: Maylon Peppers ,  CSN: [id number] Age: 55 Admit Type: Outpatient Procedure:                Upper GI endoscopy Indications:              Abdominal distress Providers:                Maylon Peppers, Crystal Page, Nelma Rothman,                            Technician Referring MD:              Medicines:                Monitored Anesthesia Care Complications:            No immediate complications. Estimated Blood Loss:     Estimated blood loss: none. Procedure:                Pre-Anesthesia Assessment:                           - Prior to the procedure, a History and Physical                            was performed, and patient medications, allergies                            and sensitivities were reviewed. The patient's                            tolerance of previous anesthesia was reviewed.                           - The risks and benefits of the procedure and the                            sedation options and risks were discussed with the                            patient. All questions were answered and informed                            consent was obtained.                           - ASA Grade Assessment: II - A patient with mild                            systemic disease.                           After obtaining informed consent, the endoscope was                            passed under direct vision. Throughout the  procedure, the patient's blood pressure, pulse, and                            oxygen saturations were monitored continuously. The                            [id number]-[id number] ([id number]) scope was introduced through the                            mouth, and advanced to the second part of duodenum.                            The upper GI endoscopy was accomplished without                             difficulty. The patient tolerated the procedure                            well. Scope In: 11:53:08 AM Scope Out: 11:57:37 AM Total Procedure Duration: 0 hours 4 minutes 29 seconds  Findings:      The Z-line was regular and was found 38 cm from the incisors.      A 2 cm hiatal hernia was present.      The entire examined stomach was normal.      The examined duodenum was normal. Biopsies for histology were taken with       a cold forceps for evaluation of celiac disease. Impression:               - Z-line regular, 38 cm from the incisors.                           - 2 cm hiatal hernia.                           - Normal stomach.                           - Normal examined duodenum. Biopsied. Moderate Sedation:      Per Anesthesia Care Recommendation:           - Discharge patient to home (ambulatory).                           - Low FODMAP diet.                           - Await pathology results. Procedure Code(s):        --- Professional ---                           [phone], Esophagogastroduodenoscopy, flexible,                            transoral; with biopsy, single or multiple Diagnosis Code(s):        --- Professional ---  K44.9, Diaphragmatic hernia without obstruction or                            gangrene                           R10.9, Unspecified abdominal pain CPT copyright 2019 [employer]. All rights reserved. The codes documented in this report are preliminary and upon coder review may  be revised to meet current compliance requirements. Maylon Peppers, MD Maylon Peppers,  01/04/2021 12:00:42 PM This report has been signed electronically. Number of Addenda: 0

## 2021-01-05 LAB — SURGICAL PATHOLOGY

## 2021-01-06 ENCOUNTER — Encounter (HOSPITAL_COMMUNITY): Payer: Self-pay | Admitting: Gastroenterology

## 2021-01-10 ENCOUNTER — Encounter (INDEPENDENT_AMBULATORY_CARE_PROVIDER_SITE_OTHER): Payer: Self-pay | Admitting: *Deleted

## 2021-01-13 ENCOUNTER — Ambulatory Visit (INDEPENDENT_AMBULATORY_CARE_PROVIDER_SITE_OTHER): Payer: BC Managed Care – PPO | Admitting: Clinical

## 2021-01-13 ENCOUNTER — Other Ambulatory Visit: Payer: Self-pay

## 2021-01-13 DIAGNOSIS — F422 Mixed obsessional thoughts and acts: Secondary | ICD-10-CM | POA: Diagnosis not present

## 2021-01-13 DIAGNOSIS — F331 Major depressive disorder, recurrent, moderate: Secondary | ICD-10-CM | POA: Diagnosis not present

## 2021-01-13 DIAGNOSIS — F419 Anxiety disorder, unspecified: Secondary | ICD-10-CM | POA: Diagnosis not present

## 2021-01-13 NOTE — Progress Notes (Signed)
Virtual Visit via Video Note  I connected with Cheryl Cross on 01/13/21 at  9:00 AM EST by a video enabled telemedicine application and verified that I am speaking with the correct person using two identifiers.  Location: Patient: Home Provider: Office   I discussed the limitations of evaluation and management by telemedicine and the availability of in person appointments. The patient expressed understanding and agreed to proceed.     Comprehensive Clinical Assessment (CCA) Note  01/13/2021 Cheryl Cross [id number]  Chief Complaint: Depression/Anxiety/OCD Visit Diagnosis: Depression/Anxiety/OCD   CCA Screening, Triage and Referral (STR)  Patient Reported Information How did you hear about [place]? No data recorded Referral name: No data recorded Referral phone number: No data recorded  Whom do you see for routine medical problems? No data recorded Practice/Facility Name: No data recorded Practice/Facility Phone Number: No data recorded Name of Contact: No data recorded Contact Number: No data recorded Contact Fax Number: No data recorded Prescriber Name: No data recorded Prescriber Address (if known): No data recorded  What Is the Reason for Your Visit/Call Today? No data recorded How Long Has This Been Causing You Problems? No data recorded What Do You Feel Would Help You the Most Today? No data recorded  Have You Recently Been in Any Inpatient Treatment (Hospital/Detox/Crisis Center/28-Day Program)? No data recorded Name/Location of Program/Hospital:No data recorded How Long Were You There? No data recorded When Were You Discharged? No data recorded  Have You Ever Received Services From [facility] Before? No data recorded Who Do You See at [facility]? No data recorded  Have You Recently Had Any Thoughts About Hurting Yourself? No data recorded Are You Planning to Commit Suicide/Harm Yourself At This time? No data recorded  Have you Recently Had Thoughts  About [place]? No data recorded Explanation: No data recorded  Have You Used Any Alcohol or Drugs in the Past 24 Hours? No data recorded How Long Ago Did You Use Drugs or Alcohol? No data recorded What Did You Use and How Much? No data recorded  Do You Currently Have a Therapist/Psychiatrist? No data recorded Name of Therapist/Psychiatrist: No data recorded  Have You Been Recently Discharged From Any Office Practice or Programs? No data recorded Explanation of Discharge From Practice/Program: No data recorded    CCA Screening Triage Referral Assessment Type of Contact: No data recorded Is this Initial or Reassessment? No data recorded Date Telepsych consult ordered in [facility]:  No data recorded Time Telepsych consult ordered in [facility]:  No data recorded  Patient Reported Information Reviewed? No data recorded Patient Left Without Being Seen? No data recorded Reason for Not Completing Assessment: No data recorded  Collateral Involvement: No data recorded  Does Patient Have a [place]? No data recorded Name and Contact of Legal Guardian: No data recorded If Minor and Not Living with Parent(s), Who has Custody? No data recorded Is CPS involved or ever been involved? No data recorded Is APS involved or ever been involved? No data recorded  Patient Determined To Be At Risk for Harm To Self or Others Based on Review of Patient Reported Information or Presenting Complaint? No data recorded Method: No data recorded Availability of Means: No data recorded Intent: No data recorded Notification Required: No data recorded Additional Information for Danger to Others Potential: No data recorded Additional Comments for Danger to Others Potential: No data recorded Are There Guns or Other Weapons in Your Home? No data recorded Types of Guns/Weapons: No data  recorded Are These Weapons Safely Secured?                            No data recorded Who Could Verify You  Are Able To Have These Secured: No data recorded Do You Have any Outstanding Charges, Pending Court Dates, Parole/Probation? No data recorded Contacted To Inform of Risk of Harm To Self or Others: No data recorded  Location of Assessment: No data recorded  Does Patient Present under Involuntary Commitment? No data recorded IVC Papers Initial File Date: No data recorded  [place] of Residence: No data recorded  Patient Currently Receiving the Following Services: No data recorded  Determination of Need: No data recorded  Options For Referral: No data recorded    CCA Biopsychosocial Intake/Chief Complaint:  The patient notes difficulty with Depression/Anxiety/and OCD  Current Symptoms/Problems: Anxiety, strong sensations of panic, difficulty with mood.   Patient Reported Schizophrenia/Schizoaffective Diagnosis in Past: No   Strengths: Good to others, likes to help other people.  Preferences: The patient notes, " In my free time i like to be outside".  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 Harrington Challenger) and Individual Therapy   Initial Clinical Notes/Concerns: The patient has prior indication of Depression/Anxiety/ and OCD. Prior hospitalization for mental health several years ago due to Depression and Opiod Dependency. No current H/I or S/I.   Mental Health Symptoms Depression:  Change in energy/activity; Difficulty Concentrating; Fatigue; Increase/decrease in appetite; Sleep (too much or little); Weight gain/loss   Duration of Depressive symptoms: Greater than two weeks   Mania:  None   Anxiety:   Difficulty concentrating; Fatigue; Irritability; Restlessness; Sleep; Tension; Worrying   Psychosis:  None   Duration of Psychotic symptoms: No data recorded  Trauma:  None (Notes finding her husband passed away in their home)   Obsessions:  Cause anxiety; Good  insight; Intrusive/time consuming; Disrupts routine/functioning; Attempts to suppress/neutralize; Recurrent & persistent thoughts/impulses/images (Typically around cleaning)   Compulsions:  Disrupts with routine/functioning; "Driven" to perform behaviors/acts; Intended to reduce stress or prevent another outcome; Intrusive/time consuming; Repeated behaviors/mental acts; Absent insight/delusional   Inattention:  None   Hyperactivity/Impulsivity:  N/A   Oppositional/Defiant Behaviors:  None   Emotional Irregularity:  None   Other Mood/Personality Symptoms:  No Additional Information    Mental Status Exam Appearance and self-care  Stature:  Tall   Weight:  Underweight   Clothing:  Casual   Grooming:  Normal   Cosmetic use:  Age appropriate   Posture/gait:  Normal   Motor activity:  Not Remarkable   Sensorium  Attention:  Normal   Concentration:  Anxiety interferes   Orientation:  X5   Recall/memory:  Normal   Affect and Mood  Affect:  Appropriate   Mood:  Depressed; Anxious   Relating  Eye contact:  Normal   Facial expression:  Depressed; Responsive   Attitude toward examiner:  Cooperative   Thought and Language  Speech flow: Normal   Thought content:  Appropriate to Mood and Circumstances   Preoccupation:  None   Hallucinations:  None   Organization:  Logical  Transport planner of Knowledge:  Good   Intelligence:  Average   Abstraction:  Normal   Judgement:  Good   Reality Testing:  Realistic   Insight:  Good   Decision Making:  Normal   Social Functioning  Social  Maturity:  Isolates   Social Judgement:  Normal   Stress  Stressors:  Family conflict; Financial; Work (Husband passed away in 12-24-2020difficulty with vision, IBS. currently on disability)   Coping Ability:  Normal   Skill Deficits:  None   Supports:  Family     Religion: Religion/Spirituality Are You A Religious Person?: Yes What is Your Religious  Affiliation?: Methodist How Might This Affect Treatment?: Protective Factor  Leisure/Recreation: Leisure / Recreation Do You Have Hobbies?: Yes Leisure and Hobbies: being outside / gardening  Exercise/Diet: Exercise/Diet Do You Exercise?: No Have You Gained or Lost A Significant Amount of Weight in the Past Six Months?: No Do You Follow a Special Diet?: No Do You Have Any Trouble Sleeping?: Yes Explanation of Sleeping Difficulties: The patient notes difficulty with falling asleep as well as staying asleep   CCA Employment/Education Employment/Work Situation: Employment / Work Situation Employment situation: On disability Why is patient on disability: Mental health How long has patient been on disability: Since 08/26/2020 Patient's job has been impacted by current illness: Yes Describe how patient's job has been impacted: Difficulty with work due to anxiety/depression and physical health difficulty with vision What is the longest time patient has a held a job?: 10 years Where was the patient employed at that time?: [employer] Has patient ever been in the [employer]?: No  Education: Education Is Patient Currently Attending School?: No Last Grade Completed: 12 Name of [place]: [employer] Did Teacher, adult education From [employer]?: Yes Did You Attend College?: Yes What Type of College Degree Do you Have?: [employer]  and received a B.A. degree in [place] Did [place]?: No What Was Your Major?: NA Did You Have Any Special Interests In School?: NA Did You Have An Individualized Education Program (IIEP): No Did You Have Any Difficulty At School?: No Patient's Education Has Been Impacted by Current Illness: No   CCA Family/Childhood History Family and Relationship History: Family history Marital status: Widowed Widowed, when?: 2020 Are you sexually active?: No What is your sexual orientation?: Heterosexual Has your sexual activity been  affected by drugs, alcohol, medication, or emotional stress?: NA Does patient have children?: No (Pt considers herself to be mother to 2 neices, I;ve raised)  Childhood History:  Childhood History By whom was/is the patient raised?: Both parents Additional childhood history information: none disclosed Description of patient's relationship with caregiver when they were a child: Good Patient's description of current relationship with people who raised him/her: The patient notes, " I have a excellent relationship with my Mother and father". How were you disciplined when you got in trouble as a child/adolescent?: Talking too Does patient have siblings?: Yes Number of Siblings: 3 Description of patient's current relationship with siblings: 2 brothers and 1 sister. The patient notes, " With one of my brothers we havent spoken in years. With my other brother and sister we get along fine". Did patient suffer any verbal/emotional/physical/sexual abuse as a child?: No Did patient suffer from severe childhood neglect?: No Has patient ever been sexually abused/assaulted/raped as an adolescent or adult?: No Was the patient ever a victim of a crime or a disaster?: No Witnessed domestic violence?: No Has patient been affected by domestic violence as an adult?: No  Child/Adolescent Assessment:     CCA Substance Use Alcohol/Drug Use: Alcohol / Drug Use Pain Medications: See MAR Prescriptions: See MAR Over the Counter: B12 and asprin History of alcohol / drug use?: No history of  alcohol / drug abuse Longest period of sobriety (when/how long): None  Negative Consequences of Use: Personal relationships,Work / School,Financial Withdrawal Symptoms: Weakness,Patient aware of relationship between substance abuse and physical/medical complications,Sweats                         ASAM's:  Six Dimensions of Multidimensional Assessment  Dimension 1:  Acute Intoxication and/or Withdrawal  Potential:      Dimension 2:  Biomedical Conditions and Complications:      Dimension 3:  Emotional, Behavioral, or Cognitive Conditions and Complications:     Dimension 4:  Readiness to Change:     Dimension 5:  Relapse, Continued use, or Continued Problem Potential:     Dimension 6:  Recovery/Living Environment:     ASAM Severity Score:    ASAM Recommended Level of Treatment:     Substance use Disorder (SUD)    Recommendations for Services/Supports/Treatments: Recommendations for Services/Supports/Treatments Recommendations For Services/Supports/Treatments: Individual Therapy,Medication Management  DSM5 Diagnoses: Patient Active Problem List   Diagnosis Date Noted  . Polyneuropathy 10/13/2020  . Vision disturbance 10/13/2020  . 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 Depression with anxiety 10/13/2020  . Major depressive disorder, single episode, moderate ([place]) 04/19/2020  . Cervical radiculitis 08/13/2018  . Abdominal pain, right upper quadrant 06/10/2018  . OCD (obsessive compulsive disorder) 02/25/2013  . 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 Benzodiazepine dependence ([place]) 03/13/2012  . Major depressive disorder 03/13/2012  . Polycythemia vera ([place]) 12/07/2011  . ABDOMINAL PAIN, EPIGASTRIC 03/28/2010  . IBS 01/30/2010  . ABDOMINAL PAIN, LEFT LOWER QUADRANT 01/30/2010    Patient Centered Plan: Patient is on the following Treatment Plan(s):  Depression/Anxiety/OCD  Referrals to Alternative Service(s): Referred to Alternative Service(s):   Place:   Date:   Time:    Referred to Alternative Service(s):   Place:   Date:   Time:    Referred to Alternative Service(s):   Place:   Date:   Time:    Referred to Alternative Service(s):   Place:   Date:   Time:     I discussed the assessment and treatment plan with the patient. The patient was provided an opportunity to ask questions and all were answered. The patient agreed with the plan and demonstrated an  understanding of the instructions.   The patient was advised to call back or seek an in-person evaluation if the symptoms worsen or if the condition fails to improve as anticipated.  I provided 60 minutes of non-face-to-face time during this encounter.  Lennox Grumbles, LCSW   01/13/2021

## 2021-02-06 ENCOUNTER — Telehealth (INDEPENDENT_AMBULATORY_CARE_PROVIDER_SITE_OTHER): Payer: BC Managed Care – PPO | Admitting: Psychiatry

## 2021-02-06 ENCOUNTER — Other Ambulatory Visit: Payer: Self-pay

## 2021-02-06 ENCOUNTER — Encounter (HOSPITAL_COMMUNITY): Payer: Self-pay | Admitting: Psychiatry

## 2021-02-06 DIAGNOSIS — F418 Other specified anxiety disorders: Secondary | ICD-10-CM

## 2021-02-06 DIAGNOSIS — F321 Major depressive disorder, single episode, moderate: Secondary | ICD-10-CM | POA: Diagnosis not present

## 2021-02-06 DIAGNOSIS — F429 Obsessive-compulsive disorder, unspecified: Secondary | ICD-10-CM

## 2021-02-06 DIAGNOSIS — F5105 Insomnia due to other mental disorder: Secondary | ICD-10-CM

## 2021-02-06 MED ORDER — MIRTAZAPINE 15 MG PO TABS: 15.0000 mg | ORAL_TABLET | Freq: Every day | ORAL | 2 refills | Status: DC

## 2021-02-06 MED ORDER — SERTRALINE HCL 100 MG PO TABS: ORAL_TABLET | ORAL | 3 refills | Status: DC

## 2021-02-06 MED ORDER — CLONAZEPAM 0.5 MG PO TABS: 0.5000 mg | ORAL_TABLET | Freq: Three times a day (TID) | ORAL | 2 refills | Status: DC | PRN

## 2021-02-06 MED ORDER — TRAZODONE HCL 50 MG PO TABS: 50.0000 mg | ORAL_TABLET | Freq: Every day | ORAL | 3 refills | Status: DC

## 2021-02-06 NOTE — Progress Notes (Signed)
Virtual Visit via Telephone Note  I connected with Cheryl Cross on 02/06/21 at  9:40 AM EDT by telephone and verified that I am speaking with the correct person using two identifiers.  Location: Patient: home Provider: home   I discussed the limitations, risks, security and privacy concerns of performing an evaluation and management service by telephone and the availability of in person appointments. I also discussed with the patient that there may be a patient responsible charge related to this service. The patient expressed understanding and agreed to proceed.     I discussed the assessment and treatment plan with the patient. The patient was provided an opportunity to ask questions and all were answered. The patient agreed with the plan and demonstrated an understanding of the instructions.   The patient was advised to call back or seek an in-person evaluation if the symptoms worsen or if the condition fails to improve as anticipated.  I provided 15 minutes of non-face-to-face time during this encounter.   [name], MD  [facility] MD/PA/NP OP Progress Note  02/06/2021 10:21 AM Cheryl Cross  MRN:  [id number]  Chief Complaint:  Chief Complaint    Anxiety; Depression; Follow-up     HPI: This patient is a 55 year old widowed white female who lives alone in [place]. 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  She is still having significant visual problems and her ophthalmologist is sending her to a specialty clinic at [facility] tomorrow.  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  She is going to have a lot of testing and this will probably clarify the diagnosis.  This will also help with her short-term and long-term disability.  In terms of her mood she is still very stressed and has trouble enjoying things.  The double vision makes  everything difficult for her.  She does see her parents and other family members but otherwise stays primarily to her self.  She denies any thoughts of self-harm or suicide.  She is sleeping well with the trazodone.  She does have a lot of anxiety.  I told her it be best if she took the low-dose clonazepam when she first got started in the morning to help her day go smoother and she is going to give it a try. Visit Diagnosis:    ICD-10-CM   1. 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 Major depressive disorder, single episode, moderate (HCC)  F32.1 sertraline (ZOLOFT) 100 MG tablet  3. Obsessive-compulsive disorder, unspecified type  F42.9 sertraline (ZOLOFT) 100 MG tablet    Past Psychiatric History: Past psychiatric hospitalization in 2013  Past Medical History:  Past Medical History:  Diagnosis Date  . Abdominal pain, right upper quadrant 06/10/2018  . Anxiety   . Arthritis   . Bursitis   . DDD (degenerative disc disease)   . Depression   . Fibromyalgia   . Obsessive-compulsive disorder   . Overactive bladder   . 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 PTSD (post-traumatic stress disorder)   . PVC's (premature ventricular contractions)    pt. placed on heart monitor x 24hours, everything fine   . Sesamoiditis February 2017    Past Surgical History:  Procedure Laterality Date  . ABDOMINAL HYSTERECTOMY    . BIOPSY  01/04/2021   Procedure: BIOPSY;  Surgeon: Montez Morita, Quillian Quince, MD;  [facility];  Service: Gastroenterology;;  small bowel biopsy  . COLONOSCOPY WITH PROPOFOL N/A 01/04/2021   Procedure: COLONOSCOPY WITH PROPOFOL;  Surgeon: [name], MD;  [facility];  Service: Gastroenterology;  Laterality: N/A;  1115  . ESOPHAGOGASTRODUODENOSCOPY N/A 09/10/2014   Procedure: ESOPHAGOGASTRODUODENOSCOPY (EGD);  Surgeon: Rogene Houston, MD;  [facility];  Service:  Endoscopy;  Laterality: N/A;  130 - moved to 3:15 - Ann to notify  . ESOPHAGOGASTRODUODENOSCOPY (EGD) WITH PROPOFOL N/A 01/04/2021   Procedure: ESOPHAGOGASTRODUODENOSCOPY (EGD) WITH PROPOFOL;  Surgeon: [name], MD;  [facility];  Service: Gastroenterology;  Laterality: N/A;  . LUMBAR FUSION    . POLYPECTOMY  01/04/2021   Procedure: POLYPECTOMY INTESTINAL;  Surgeon: [name], MD;  [facility];  Service: Gastroenterology;;  cecal lesion; ascending colon polyp;   . TOOTH EXTRACTION Left Aug 2016    Family Psychiatric History: See below  Family History:  Family History  Problem Relation Age of Onset  . Cancer - Colon Mother        age 25  . Anxiety disorder Mother   . Diabetes Mother   . Atrial fibrillation Father   . Lung cancer Father   . Atrial fibrillation Brother   . Bipolar disorder Neg Hx   . Dementia Neg Hx   . Drug abuse Neg Hx   . Paranoid behavior Neg Hx   . Schizophrenia Neg Hx   . Seizures Neg Hx   . Sexual abuse Neg Hx   . Physical abuse Neg Hx     Social History:  Social History   Socioeconomic History  . Marital status: Widowed    Spouse name: Not on file  . Number of children: 0  . Years of education: BA  . Highest education level: Not on file  Occupational History  . Occupation: [employer]- on leave (since 08/2020)  Tobacco Use  . Smoking status: Current Every Day Smoker    Packs/day: 1.00    Years: 15.00    Pack years: 15.00    Types: Cigarettes    Start date: 11/12/1996  . Smokeless tobacco: Never Used  . Tobacco comment: 1 pack a day x 20 yrs.  Vaping Use  . Vaping Use: Never used  Substance and Sexual Activity  . Alcohol use: No    Alcohol/week: 0.0 standard drinks  . Drug use: No    Types: Hydrocodone, Benzodiazepines  . Sexual activity: Not on file  Other Topics Concern  . Not on file  Social History Narrative   Right handed   2-3 glasses soda per day   Lives  alone   Social Determinants of Health   Financial Resource Strain: Not on file  Food Insecurity: Not on file  Transportation Needs: Not on file  Physical Activity: Not on file  Stress: Not on file  Social Connections: Not on file    Allergies:  Allergies  Allergen Reactions  . Gabapentin Hives  . Hydrocodone-Acetaminophen Nausea Only  . Penicillins Other (See Comments)    Unknown child hood reaction .Did it involve swelling of the face/tongue/throat, SOB, or low BP? Unknown Did it involve sudden or severe rash/hives, skin peeling, or any reaction on the inside of your mouth or nose? Unknown Did you need to seek medical attention at a hospital or doctor's office? Unknown When did it last happen? If all above answers are "NO", may  proceed with cephalosporin use.     Metabolic Disorder Labs: No results found for: HGBA1C, MPG No results found for: PROLACTIN No results found for: CHOL, TRIG, HDL, CHOLHDL, VLDL, LDLCALC Lab Results  Component Value Date   TSH 1.770 10/13/2020   TSH 1.490 05/28/2016    Therapeutic Level Labs: No results found for: LITHIUM No results found for: VALPROATE No components found for:  CBMZ  Current Medications: Current Outpatient Medications  Medication Sig Dispense Refill  . aspirin 81 MG tablet Take 1 tablet (81 mg total) by mouth daily. For Polycythemia Vera.    . clonazePAM (KLONOPIN) 0.5 MG tablet Take 1 tablet (0.5 mg total) by mouth 3 (three) times daily as needed for anxiety. 90 tablet 2  . dicyclomine (BENTYL) 10 MG capsule Take 1 capsule (10 mg total) by mouth every 12 (twelve) hours as needed (abdominal pain). 60 capsule 2  . mirtazapine (REMERON) 15 MG tablet Take 1 tablet (15 mg total) by mouth at bedtime. 30 tablet 2  . sertraline (ZOLOFT) 100 MG tablet TAKE 1 AND 1/2 TABLETS(150 MG) BY MOUTH DAILY 45 tablet 3  . traMADol (ULTRAM) 50 MG tablet Take 0.5 tablets (25 mg total) by mouth every 12 (twelve) hours as needed for  moderate pain (has polycythemia, reports severe arthralgias intermittently). 30 tablet 0  . traZODone (DESYREL) 50 MG tablet Take 1 tablet (50 mg total) by mouth at bedtime. 30 tablet 3  . Vitamin A 2400 MCG (8000 UT) CAPS Take 2,400 mcg by mouth daily with lunch.    . vitamin B-12 (CYANOCOBALAMIN) 500 MCG tablet Take 500 mcg by mouth daily with lunch.     No current facility-administered medications for this visit.     Musculoskeletal: Strength & Muscle Tone: within normal limits Gait & Station: normal Patient leans: N/A  Psychiatric Specialty Exam: Review of Systems  Psychiatric/Behavioral: The patient is nervous/anxious.     There were no vitals taken for this visit.There is no height or weight on file to calculate BMI.  General Appearance: NA  Eye Contact:  NA  Speech:  Clear and Coherent  Volume:  Normal  Mood:  Euthymic  Affect:  Constricted  Thought Process:  Goal Directed  Orientation:  Full (Time, Place, and Person)  Thought Content: Rumination   Suicidal Thoughts:  No  Homicidal Thoughts:  No  Memory:  Immediate;   Good Recent;   Good Remote;   Good  Judgement:  Good  Insight:  Good  Psychomotor Activity:  Decreased  Concentration:  Concentration: Good and Attention Span: Good  Recall:  Good  Fund of Knowledge: Good  Language: Good  Akathisia:  No  Handed:  Right  AIMS (if indicated): not done  Assets:  Communication Skills Desire for Improvement Resilience Social Support Talents/Skills  ADL's:  Intact  Cognition: WNL  Sleep:  Good   Screenings: GAD-7   Health and safety inspector from 01/13/2021 in [facility]  Total GAD-7 Score 17    PHQ2-9   Flowsheet Row Video Visit from 02/06/2021 in [place] Counselor from 01/13/2021 in [place] Office Visit from 06/10/2018 in [place] Office Visit from  06/04/2017 in [place] Office Visit from 05/28/2016 in [place]  PHQ-2 Total Score 2 3 1 1  0  PHQ-9 Total Score 3 15 - - -    Flowsheet Row Video Visit from 02/06/2021 in [place]  [facility] Counselor from 01/13/2021 in [place] Admission (Discharged) from 01/04/2021 in [place] No Risk No Risk No Risk       Assessment and Plan: This patient is a 55 year old female with a history of substance abuse in remission depression insomnia and significant visual problems.  She does feel that her medications are helpful but I suggested that she take the clonazepam first thing in the morning.  She can use clonazepam 0.5 mg up to 3 times a day.  She will continue trazodone 50 mg at bedtime as it is helping her sleep and Zoloft 150 mg daily for depression.  She will return to see me in 6 weeks   [name], MD 02/06/2021, 10:21 AM

## 2021-03-02 DIAGNOSIS — Z0271 Encounter for disability determination: Secondary | ICD-10-CM

## 2021-03-14 ENCOUNTER — Other Ambulatory Visit: Payer: Self-pay

## 2021-03-14 ENCOUNTER — Telehealth (INDEPENDENT_AMBULATORY_CARE_PROVIDER_SITE_OTHER): Payer: BC Managed Care – PPO | Admitting: Psychiatry

## 2021-03-14 ENCOUNTER — Encounter (HOSPITAL_COMMUNITY): Payer: Self-pay | Admitting: Psychiatry

## 2021-03-14 DIAGNOSIS — F321 Major depressive disorder, single episode, moderate: Secondary | ICD-10-CM

## 2021-03-14 DIAGNOSIS — F5105 Insomnia due to other mental disorder: Secondary | ICD-10-CM

## 2021-03-14 DIAGNOSIS — F429 Obsessive-compulsive disorder, unspecified: Secondary | ICD-10-CM

## 2021-03-14 DIAGNOSIS — F418 Other specified anxiety disorders: Secondary | ICD-10-CM

## 2021-03-14 MED ORDER — MIRTAZAPINE 15 MG PO TABS: 15.0000 mg | ORAL_TABLET | Freq: Every day | ORAL | 2 refills | Status: DC

## 2021-03-14 MED ORDER — TRAZODONE HCL 50 MG PO TABS: 50.0000 mg | ORAL_TABLET | Freq: Every day | ORAL | 3 refills | Status: DC

## 2021-03-14 MED ORDER — CLONAZEPAM 0.5 MG PO TABS: 0.5000 mg | ORAL_TABLET | Freq: Three times a day (TID) | ORAL | 2 refills | Status: DC | PRN

## 2021-03-14 MED ORDER — SERTRALINE HCL 100 MG PO TABS: ORAL_TABLET | ORAL | 3 refills | Status: DC

## 2021-03-14 NOTE — Progress Notes (Signed)
Virtual Visit via Telephone Note  I connected with Cheryl Cross on 03/14/21 at 10:40 AM EDT by telephone and verified that I am speaking with the correct person using two identifiers.  Location: Patient: home Provider: office   I discussed the limitations, risks, security and privacy concerns of performing an evaluation and management service by telephone and the availability of in person appointments. I also discussed with the patient that there may be a patient responsible charge related to this service. The patient expressed understanding and agreed to proceed.     I discussed the assessment and treatment plan with the patient. The patient was provided an opportunity to ask questions and all were answered. The patient agreed with the plan and demonstrated an understanding of the instructions.   The patient was advised to call back or seek an in-person evaluation if the symptoms worsen or if the condition fails to improve as anticipated.  I provided 15 minutes of non-face-to-face time during this encounter.   [name], MD  [facility] MD/PA/NP OP Progress Note  03/14/2021 11:07 AM Cheryl Cross  MRN:  [id number]  Chief Complaint:  Chief Complaint    Anxiety; Depression; Follow-up     HPI: This patient is a 55 year old widowed white female who lives alone in [place]. 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  She is still having significant visual problems and she has seen a specialist at [facility].  She has been diagnosed with" dystrophy but is going to have genetic testing to try to clarify the correct diagnosis.  In the meantime there is not a lot she can do.  She is frustrated with all of this is difficult for her to do much reading or writing and definitely not able to drive.  She is spending time with her parents both of whom have medical  issues.  She states at times she feels sad and worried but she is trying to stay positive.  She denies thoughts of self-harm or suicide.  She is using the medications and they are helping with her anxiety and depression.  She is sleeping well with the trazodone.  She is having significant financial issues as she was denied short-term disability. Visit Diagnosis:    ICD-10-CM   1. Major depressive disorder, single episode, moderate (HCC)  F32.1 sertraline (ZOLOFT) 100 MG tablet  2. Insomnia secondary to depression with anxiety  F51.05 traZODone (DESYREL) 50 MG tablet   F41.8   3. Obsessive-compulsive disorder, unspecified type  F42.9 sertraline (ZOLOFT) 100 MG tablet    Past Psychiatric History: Past psychiatric hospitalization in 2013  Past Medical History:  Past Medical History:  Diagnosis Date  . Abdominal pain, right upper quadrant 06/10/2018  . Anxiety   . Arthritis   . Bursitis   . DDD (degenerative disc disease)   . Depression   . Fibromyalgia   . Obsessive-compulsive disorder   . Overactive bladder   . 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 PTSD (post-traumatic stress disorder)   . PVC's (premature ventricular contractions)    pt. placed on heart monitor x 24hours, everything fine   . Sesamoiditis February 2017    Past Surgical History:  Procedure Laterality Date  . ABDOMINAL HYSTERECTOMY    . BIOPSY  01/04/2021   Procedure: BIOPSY;  Surgeon: [name], MD;  [facility];  Service: Gastroenterology;;  small bowel biopsy  . COLONOSCOPY WITH PROPOFOL N/A 01/04/2021   Procedure: COLONOSCOPY WITH PROPOFOL;  Surgeon: [name], MD;  [facility];  Service: Gastroenterology;  Laterality: N/A;  1115  . ESOPHAGOGASTRODUODENOSCOPY N/A 09/10/2014   Procedure: ESOPHAGOGASTRODUODENOSCOPY (EGD);  Surgeon: Rogene Houston, MD;  [facility];  Service: Endoscopy;  Laterality: N/A;  130 - moved to 3:15 - Ann to notify  .  ESOPHAGOGASTRODUODENOSCOPY (EGD) WITH PROPOFOL N/A 01/04/2021   Procedure: ESOPHAGOGASTRODUODENOSCOPY (EGD) WITH PROPOFOL;  Surgeon: [name], MD;  [facility];  Service: Gastroenterology;  Laterality: N/A;  . LUMBAR FUSION    . POLYPECTOMY  01/04/2021   Procedure: POLYPECTOMY INTESTINAL;  Surgeon: [name], MD;  [facility];  Service: Gastroenterology;;  cecal lesion; ascending colon polyp;   . TOOTH EXTRACTION Left Aug 2016    Family Psychiatric History: see below  Family History:  Family History  Problem Relation Age of Onset  . Cancer - Colon Mother        age 23  . Anxiety disorder Mother   . Diabetes Mother   . Atrial fibrillation Father   . Lung cancer Father   . Atrial fibrillation Brother   . Bipolar disorder Neg Hx   . Dementia Neg Hx   . Drug abuse Neg Hx   . Paranoid behavior Neg Hx   . Schizophrenia Neg Hx   . Seizures Neg Hx   . Sexual abuse Neg Hx   . Physical abuse Neg Hx     Social History:  Social History   Socioeconomic History  . Marital status: Widowed    Spouse name: Not on file  . Number of children: 0  . Years of education: BA  . Highest education level: Not on file  Occupational History  . Occupation: [employer]- on leave (since 08/2020)  Tobacco Use  . Smoking status: Current Every Day Smoker    Packs/day: 1.00    Years: 15.00    Pack years: 15.00    Types: Cigarettes    Start date: 11/12/1996  . Smokeless tobacco: Never Used  . Tobacco comment: 1 pack a day x 20 yrs.  Vaping Use  . Vaping Use: Never used  Substance and Sexual Activity  . Alcohol use: No    Alcohol/week: 0.0 standard drinks  . Drug use: No    Types: Hydrocodone, Benzodiazepines  . Sexual activity: Not on file  Other Topics Concern  . Not on file  Social History Narrative   Right handed   2-3 glasses soda per day   Lives alone   Social Determinants of Health   Financial Resource Strain:  Not on file  Food Insecurity: Not on file  Transportation Needs: Not on file  Physical Activity: Not on file  Stress: Not on file  Social Connections: Not on file    Allergies:  Allergies  Allergen Reactions  . Gabapentin Hives  . Hydrocodone-Acetaminophen Nausea Only  . Penicillins Other (See Comments)    Unknown child hood reaction .Did it involve swelling of the face/tongue/throat, SOB, or low BP? Unknown Did it involve sudden or severe rash/hives, skin peeling, or any reaction on the inside of your mouth or nose? Unknown Did you need to seek medical attention at a hospital or doctor's office? Unknown When did it last happen? If all above answers are "NO", may proceed with cephalosporin use.     Metabolic Disorder Labs: No results found  for: HGBA1C, MPG No results found for: PROLACTIN No results found for: CHOL, TRIG, HDL, CHOLHDL, VLDL, LDLCALC Lab Results  Component Value Date   TSH 1.770 10/13/2020   TSH 1.490 05/28/2016    Therapeutic Level Labs: No results found for: LITHIUM No results found for: VALPROATE No components found for:  CBMZ  Current Medications: Current Outpatient Medications  Medication Sig Dispense Refill  . aspirin 81 MG tablet Take 1 tablet (81 mg total) by mouth daily. For Polycythemia Vera.    . clonazePAM (KLONOPIN) 0.5 MG tablet Take 1 tablet (0.5 mg total) by mouth 3 (three) times daily as needed for anxiety. 90 tablet 2  . dicyclomine (BENTYL) 10 MG capsule Take 1 capsule (10 mg total) by mouth every 12 (twelve) hours as needed (abdominal pain). 60 capsule 2  . mirtazapine (REMERON) 15 MG tablet Take 1 tablet (15 mg total) by mouth at bedtime. 30 tablet 2  . sertraline (ZOLOFT) 100 MG tablet TAKE 1 AND 1/2 TABLETS(150 MG) BY MOUTH DAILY 45 tablet 3  . traMADol (ULTRAM) 50 MG tablet Take 0.5 tablets (25 mg total) by mouth every 12 (twelve) hours as needed for moderate pain (has polycythemia, reports severe arthralgias intermittently).  30 tablet 0  . traZODone (DESYREL) 50 MG tablet Take 1 tablet (50 mg total) by mouth at bedtime. 30 tablet 3  . Vitamin A 2400 MCG (8000 UT) CAPS Take 2,400 mcg by mouth daily with lunch.    . vitamin B-12 (CYANOCOBALAMIN) 500 MCG tablet Take 500 mcg by mouth daily with lunch.     No current facility-administered medications for this visit.     Musculoskeletal: Strength & Muscle Tone: within normal limits Gait & Station: normal Patient leans: N/A  Psychiatric Specialty Exam: Review of Systems  Eyes: Positive for visual disturbance.  Psychiatric/Behavioral: Positive for decreased concentration. The patient is nervous/anxious.   All other systems reviewed and are negative.   There were no vitals taken for this visit.There is no height or weight on file to calculate BMI.  General Appearance: NA  Eye Contact:  NA  Speech:  Clear and Coherent  Volume:  Normal  Mood:  Anxious  Affect:  NA  Thought Process:  Goal Directed  Orientation:  Full (Time, Place, and Person)  Thought Content: WDL   Suicidal Thoughts:  No  Homicidal Thoughts:  No  Memory:  Immediate;   Good Recent;   Good Remote;   Good  Judgement:  Good  Insight:  Good  Psychomotor Activity:  Decreased  Concentration:  Concentration: Fair and Attention Span: Fair  Recall:  Good  Fund of Knowledge: Good  Language: Good  Akathisia:  No  Handed:  Right  AIMS (if indicated): not done  Assets:  Communication Skills Desire for Improvement Resilience Social Support  ADL's:  Intact  Cognition: WNL  Sleep:  Good   Screenings: GAD-7   Flowsheet Row Counselor from 01/13/2021 in [place] [facility]  Total GAD-7 Score 17    PHQ2-9   Flowsheet Row Video Visit from 03/14/2021 in [place] [facility] Video Visit from 02/06/2021 in [place] Counselor from 01/13/2021 in [place] Office Visit from 06/10/2018 in [place] Office Visit from 06/04/2017 in [place]  PHQ-2 Total Score 3 2 3 1 1   PHQ-9 Total Score 4 3 15  -- --    Flowsheet Row Video Visit from 03/14/2021 in [place]  [facility] Video Visit from 02/06/2021 in [place] Counselor from 01/13/2021 in [place] [facility]  C-SSRS RISK CATEGORY No Risk No Risk No Risk       Assessment and Plan: This patient is a 55 year old female with a history of substance abuse in remission depression insomnia and significant visual problems.  She does still feel that her medications are helpful for her mood and anxiety.  She will continue clonazepam 0.5 mg 3 times daily as needed for anxiety, trazodone 50 mg at bedtime for sleep, Zoloft 150 mg daily for depression .  She will return to see me in 6 weeks   [name], MD 03/14/2021, 11:07 AM

## 2021-03-15 ENCOUNTER — Ambulatory Visit: Payer: BC Managed Care – PPO | Admitting: Neurology

## 2021-04-05 ENCOUNTER — Other Ambulatory Visit (HOSPITAL_COMMUNITY): Payer: BC Managed Care – PPO

## 2021-04-12 ENCOUNTER — Ambulatory Visit (HOSPITAL_COMMUNITY): Payer: BC Managed Care – PPO | Admitting: Hematology

## 2021-04-21 ENCOUNTER — Other Ambulatory Visit (HOSPITAL_COMMUNITY): Payer: Self-pay | Admitting: *Deleted

## 2021-04-21 DIAGNOSIS — D45 Polycythemia vera: Secondary | ICD-10-CM

## 2021-04-24 ENCOUNTER — Inpatient Hospital Stay (HOSPITAL_COMMUNITY): Payer: BC Managed Care – PPO | Attending: Hematology

## 2021-04-24 ENCOUNTER — Other Ambulatory Visit: Payer: Self-pay

## 2021-04-24 DIAGNOSIS — R634 Abnormal weight loss: Secondary | ICD-10-CM | POA: Insufficient documentation

## 2021-04-24 DIAGNOSIS — D45 Polycythemia vera: Secondary | ICD-10-CM | POA: Insufficient documentation

## 2021-04-24 DIAGNOSIS — Z634 Disappearance and death of family member: Secondary | ICD-10-CM | POA: Diagnosis not present

## 2021-04-24 DIAGNOSIS — F17210 Nicotine dependence, cigarettes, uncomplicated: Secondary | ICD-10-CM | POA: Diagnosis not present

## 2021-04-24 DIAGNOSIS — M898X9 Other specified disorders of bone, unspecified site: Secondary | ICD-10-CM | POA: Insufficient documentation

## 2021-04-24 DIAGNOSIS — G8929 Other chronic pain: Secondary | ICD-10-CM | POA: Diagnosis not present

## 2021-04-24 LAB — CBC WITH DIFFERENTIAL/PLATELET
Abs Immature Granulocytes: 0.01 10*3/uL (ref 0.00–0.07)
Basophils Absolute: 0 10*3/uL (ref 0.0–0.1)
Basophils Relative: 0 %
Eosinophils Absolute: 0.1 10*3/uL (ref 0.0–0.5)
Eosinophils Relative: 1 %
HCT: 40.6 % (ref 36.0–46.0)
Hemoglobin: 13.3 g/dL (ref 12.0–15.0)
Immature Granulocytes: 0 %
Lymphocytes Relative: 40 %
Lymphs Abs: 1.8 10*3/uL (ref 0.7–4.0)
MCH: 30.2 pg (ref 26.0–34.0)
MCHC: 32.8 g/dL (ref 30.0–36.0)
MCV: 92.1 fL (ref 80.0–100.0)
Monocytes Absolute: 0.3 10*3/uL (ref 0.1–1.0)
Monocytes Relative: 8 %
Neutro Abs: 2.3 10*3/uL (ref 1.7–7.7)
Neutrophils Relative %: 51 %
Platelets: 225 10*3/uL (ref 150–400)
RBC: 4.41 MIL/uL (ref 3.87–5.11)
RDW: 13.1 % (ref 11.5–15.5)
WBC: 4.5 10*3/uL (ref 4.0–10.5)
nRBC: 0 % (ref 0.0–0.2)

## 2021-04-24 LAB — COMPREHENSIVE METABOLIC PANEL
ALT: 11 U/L (ref 0–44)
AST: 13 U/L — ABNORMAL LOW (ref 15–41)
Albumin: 4 g/dL (ref 3.5–5.0)
Alkaline Phosphatase: 75 U/L (ref 38–126)
Anion gap: 6 (ref 5–15)
BUN: 5 mg/dL — ABNORMAL LOW (ref 6–20)
CO2: 28 mmol/L (ref 22–32)
Calcium: 8.9 mg/dL (ref 8.9–10.3)
Chloride: 104 mmol/L (ref 98–111)
Creatinine, Ser: 0.75 mg/dL (ref 0.44–1.00)
GFR, Estimated: >60 mL/min (ref >60)
Glucose, Bld: 89 mg/dL (ref 70–99)
Potassium: 3.8 mmol/L (ref 3.5–5.1)
Sodium: 138 mmol/L (ref 135–145)
Total Bilirubin: 0.4 mg/dL (ref 0.3–1.2)
Total Protein: 6.6 g/dL (ref 6.5–8.1)

## 2021-04-26 ENCOUNTER — Other Ambulatory Visit: Payer: Self-pay

## 2021-04-26 ENCOUNTER — Encounter (HOSPITAL_COMMUNITY): Payer: Self-pay | Admitting: Psychiatry

## 2021-04-26 ENCOUNTER — Telehealth (INDEPENDENT_AMBULATORY_CARE_PROVIDER_SITE_OTHER): Payer: BC Managed Care – PPO | Admitting: Psychiatry

## 2021-04-26 DIAGNOSIS — F429 Obsessive-compulsive disorder, unspecified: Secondary | ICD-10-CM

## 2021-04-26 DIAGNOSIS — F321 Major depressive disorder, single episode, moderate: Secondary | ICD-10-CM

## 2021-04-26 DIAGNOSIS — F418 Other specified anxiety disorders: Secondary | ICD-10-CM | POA: Diagnosis not present

## 2021-04-26 DIAGNOSIS — F5105 Insomnia due to other mental disorder: Secondary | ICD-10-CM

## 2021-04-26 MED ORDER — SERTRALINE HCL 100 MG PO TABS: ORAL_TABLET | ORAL | 3 refills | Status: DC

## 2021-04-26 MED ORDER — CLONAZEPAM 0.5 MG PO TABS: 0.5000 mg | ORAL_TABLET | Freq: Three times a day (TID) | ORAL | 2 refills | Status: DC | PRN

## 2021-04-26 MED ORDER — TRAZODONE HCL 50 MG PO TABS: 50.0000 mg | ORAL_TABLET | Freq: Every day | ORAL | 3 refills | Status: DC

## 2021-04-26 NOTE — Progress Notes (Signed)
Virtual Visit via Telephone Note  I connected with Cheryl Cross on 04/26/21 at 10:40 AM EDT by telephone and verified that I am speaking with the correct person using two identifiers.  Location: Patient: home Provider:home office   I discussed the limitations, risks, security and privacy concerns of performing an evaluation and management service by telephone and the availability of in person appointments. I also discussed with the patient that there may be a patient responsible charge related to this service. The patient expressed understanding and agreed to proceed.      I discussed the assessment and treatment plan with the patient. The patient was provided an opportunity to ask questions and all were answered. The patient agreed with the plan and demonstrated an understanding of the instructions.   The patient was advised to call back or seek an in-person evaluation if the symptoms worsen or if the condition fails to improve as anticipated.  I provided 15 minutes of non-face-to-face time during this encounter.   [name], MD  [facility] MD/PA/NP OP Progress Note  04/26/2021 11:13 AM Cheryl Cross  MRN:  [id number]  Chief Complaint:  Chief Complaint   Anxiety; Depression; Follow-up    HPI: This patient is a 55 year old widowed white female who lives alone in [place].  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  She is gratified that she was granted back her supplemental income.  She is not as financially worried right now.  How ever she states that her vision is worsening.  She has been officially diagnosed with rod cone dystrophy and there is not really much that can be done.  She is supposed to see a different specialist at [facility] but has not yet gotten an appointment.  She is worried about eventually not being able to have much independence that she cannot  drive.  Her anxiety is gotten a little worse but she admits that she has not been taking the clonazepam very often and when she does she is only taking half a tablet.  I reminded her that she can take 0.5 mg 3 times daily.  She denies being depressed.  She is sleeping well.  She denies thoughts of self-harm or suicide.  I again offered counseling in our office but she declined for now. Visit Diagnosis:    ICD-10-CM   1. Insomnia secondary to depression with anxiety  F51.05 traZODone (DESYREL) 50 MG tablet   F41.8     2. Major depressive disorder, single episode, moderate (HCC)  F32.1 sertraline (ZOLOFT) 100 MG tablet    3. Obsessive-compulsive disorder, unspecified type  F42.9 sertraline (ZOLOFT) 100 MG tablet      Past Psychiatric History: Past psychiatric hospitalization in 2013  Past Medical History:  Past Medical History:  Diagnosis Date   Abdominal pain, right upper quadrant 06/10/2018   Anxiety    Arthritis    Bursitis    DDD (degenerative disc disease)    Depression    Fibromyalgia    Obsessive-compulsive disorder    Overactive bladder    Polycythemia vera(238.4) January 2013   PTSD (post-traumatic stress disorder)    PVC's (premature ventricular contractions)    pt. placed on heart monitor x 24hours, everything fine    Sesamoiditis February 2017    Past Surgical History:  Procedure Laterality Date   ABDOMINAL HYSTERECTOMY     BIOPSY  01/04/2021  Procedure: BIOPSY;  Surgeon: Montez Morita, Quillian Quince, MD;  [facility];  Service: Gastroenterology;;  small bowel biopsy   COLONOSCOPY WITH PROPOFOL N/A 01/04/2021   Procedure: COLONOSCOPY WITH PROPOFOL;  Surgeon: [name], MD;  [facility];  Service: Gastroenterology;  Laterality: N/A;  1115   ESOPHAGOGASTRODUODENOSCOPY N/A 09/10/2014   Procedure: ESOPHAGOGASTRODUODENOSCOPY (EGD);  Surgeon: Rogene Houston, MD;  [facility];  Service: Endoscopy;  Laterality: N/A;  130 -  moved to 3:15 - Ann to notify   ESOPHAGOGASTRODUODENOSCOPY (EGD) WITH PROPOFOL N/A 01/04/2021   Procedure: ESOPHAGOGASTRODUODENOSCOPY (EGD) WITH PROPOFOL;  Surgeon: [name], MD;  [facility];  Service: Gastroenterology;  Laterality: N/A;   LUMBAR FUSION     POLYPECTOMY  01/04/2021   Procedure: POLYPECTOMY INTESTINAL;  Surgeon: [name], MD;  [facility];  Service: Gastroenterology;;  cecal lesion; ascending colon polyp;    TOOTH EXTRACTION Left Aug 2016    Family Psychiatric History: see below  Family History:  Family History  Problem Relation Age of Onset   Cancer - Colon Mother        age 14   Anxiety disorder Mother    Diabetes Mother    Atrial fibrillation Father    Lung cancer Father    Atrial fibrillation Brother    Bipolar disorder Neg Hx    Dementia Neg Hx    Drug abuse Neg Hx    Paranoid behavior Neg Hx    Schizophrenia Neg Hx    Seizures Neg Hx    Sexual abuse Neg Hx    Physical abuse Neg Hx     Social History:  Social History   Socioeconomic History   Marital status: Widowed    Spouse name: Not on file   Number of children: 0   Years of education: BA   Highest education level: Not on file  Occupational History   Occupation: [employer]- on leave (since 08/2020)  Tobacco Use   Smoking status: Every Day    Packs/day: 1.00    Years: 15.00    Pack years: 15.00    Types: Cigarettes    Start date: 11/12/1996   Smokeless tobacco: Never   Tobacco comments:    1 pack a day x 20 yrs.  Vaping Use   Vaping Use: Never used  Substance and Sexual Activity   Alcohol use: No    Alcohol/week: 0.0 standard drinks   Drug use: No    Types: Hydrocodone, Benzodiazepines   Sexual activity: Not on file  Other Topics Concern   Not on file  Social History Narrative   Right handed   2-3 glasses soda per day   Lives alone   Social Determinants of Health   Financial Resource Strain: Not on file   Food Insecurity: Not on file  Transportation Needs: Not on file  Physical Activity: Not on file  Stress: Not on file  Social Connections: Not on file    Allergies:  Allergies  Allergen Reactions   Gabapentin Hives   Hydrocodone-Acetaminophen Nausea Only   Penicillins Other (See Comments)    Unknown child hood reaction .Did it involve swelling of the face/tongue/throat, SOB, or low BP? Unknown Did it involve sudden or severe rash/hives, skin peeling, or any reaction on the inside of your mouth or nose? Unknown Did you need to seek medical attention at a hospital or doctor's office? Unknown When did it last happen?  If all above answers are "NO", may proceed with cephalosporin use.     Metabolic Disorder Labs: No results found for: HGBA1C, MPG No results found for: PROLACTIN No results found for: CHOL, TRIG, HDL, CHOLHDL, VLDL, LDLCALC Lab Results  Component Value Date   TSH 1.770 10/13/2020   TSH 1.490 05/28/2016    Therapeutic Level Labs: No results found for: LITHIUM No results found for: VALPROATE No components found for:  CBMZ  Current Medications: Current Outpatient Medications  Medication Sig Dispense Refill   aspirin 81 MG tablet Take 1 tablet (81 mg total) by mouth daily. For Polycythemia Vera.     clonazePAM (KLONOPIN) 0.5 MG tablet Take 1 tablet (0.5 mg total) by mouth 3 (three) times daily as needed for anxiety. 90 tablet 2   dicyclomine (BENTYL) 10 MG capsule Take 1 capsule (10 mg total) by mouth every 12 (twelve) hours as needed (abdominal pain). 60 capsule 2   sertraline (ZOLOFT) 100 MG tablet TAKE 1 AND 1/2 TABLETS(150 MG) BY MOUTH DAILY 45 tablet 3   traMADol (ULTRAM) 50 MG tablet Take 0.5 tablets (25 mg total) by mouth every 12 (twelve) hours as needed for moderate pain (has polycythemia, reports severe arthralgias intermittently). 30 tablet 0   traZODone (DESYREL) 50 MG tablet Take 1 tablet (50 mg total) by mouth at bedtime. 30 tablet 3   Vitamin  A 2400 MCG (8000 UT) CAPS Take 2,400 mcg by mouth daily with lunch.     vitamin B-12 (CYANOCOBALAMIN) 500 MCG tablet Take 500 mcg by mouth daily with lunch.     No current facility-administered medications for this visit.     Musculoskeletal: Strength & Muscle Tone: within normal limits Gait & Station: normal Patient leans: N/A  Psychiatric Specialty Exam: Review of Systems  Eyes:  Positive for visual disturbance.  Psychiatric/Behavioral:  The patient is nervous/anxious.   All other systems reviewed and are negative.  There were no vitals taken for this visit.There is no height or weight on file to calculate BMI.  General Appearance: NA  Eye Contact:  NA  Speech:  Clear and Coherent  Volume:  Normal  Mood:  Anxious  Affect:  NA  Thought Process:  Goal Directed  Orientation:  Full (Time, Place, and Person)  Thought Content: Rumination   Suicidal Thoughts:  No  Homicidal Thoughts:  No  Memory:  Immediate;   Good Recent;   Good Remote;   Good  Judgement:  Good  Insight:  Good  Psychomotor Activity:  Decreased  Concentration:  Concentration: Good and Attention Span: Good  Recall:  Good  Fund of Knowledge: Good  Language: Good  Akathisia:  No  Handed:  Right  AIMS (if indicated): not done  Assets:  Communication Skills Desire for Improvement Resilience Social Support  ADL's:  Intact  Cognition: WNL  Sleep:  Good   Screenings: GAD-7    Health and safety inspector from 01/13/2021 in [place] [facility]  Total GAD-7 Score 17      PHQ2-9    Flowsheet Row Video Visit from 04/26/2021 in [place] [facility] Video Visit from 03/14/2021 in [place] [facility] Video Visit from 02/06/2021 in [place] Counselor from 01/13/2021 in [place] Office Visit from 06/10/2018 in [place]  PHQ-2 Total Score 1 3 2 3 1   PHQ-9 Total Score -- 4 3 15  --      Flowsheet Row Video Visit from  04/26/2021 in [place] Video Visit from 03/14/2021 in [place] Video Visit from 02/06/2021 in [place] [facility]  C-SSRS RISK CATEGORY No Risk No Risk No Risk        Assessment and Plan: This patient is a 55 year old female with a history of substance abuse in remission depression insomnia and significant visual impairment.  She still feels her medications are helpful for mood and anxiety so these will be continued.  She will continue clonazepam 0.5 mg 3 times daily as needed for anxiety.  I explained that she needs to take this when she is anxious.  She will continue trazodone 50 mg at bedtime for sleep and Zoloft 150 mg daily for depression.  She will return to see me in 2 months   [name], MD 04/26/2021, 11:13 AM

## 2021-05-01 ENCOUNTER — Inpatient Hospital Stay (HOSPITAL_COMMUNITY): Payer: BC Managed Care – PPO

## 2021-05-01 ENCOUNTER — Other Ambulatory Visit (HOSPITAL_COMMUNITY): Payer: BC Managed Care – PPO

## 2021-05-01 ENCOUNTER — Other Ambulatory Visit: Payer: Self-pay

## 2021-05-01 ENCOUNTER — Encounter (HOSPITAL_COMMUNITY): Payer: Self-pay | Admitting: Physician Assistant

## 2021-05-01 ENCOUNTER — Inpatient Hospital Stay (HOSPITAL_COMMUNITY): Payer: BC Managed Care – PPO | Admitting: Physician Assistant

## 2021-05-01 VITALS — BP 120/75 | HR 71 | Temp 98.5°F | Resp 18 | Wt 125.1 lb

## 2021-05-01 DIAGNOSIS — D45 Polycythemia vera: Secondary | ICD-10-CM

## 2021-05-01 MED ORDER — TRAMADOL HCL 50 MG PO TABS: 25.0000 mg | ORAL_TABLET | Freq: Two times a day (BID) | ORAL | 0 refills | Status: DC | PRN

## 2021-05-01 NOTE — Progress Notes (Signed)
No phlebotomy needed today per PA.

## 2021-05-01 NOTE — Progress Notes (Signed)
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 55 y.o. female returns for routine follow-up of polycythemia.  She was last seen by Dr. Chryl Heck on 12/30/2020.  Her last phlebotomy was 12/30/2020.  Patient reports that she has been doing fairly well since her last visit.   She denies any hospitalizations, infections, surgeries, or changes in her baseline health status. 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   She has chronic bone pain related to her polycythemia as well as her degenerative disc disease, intermittent bone pain deep within her forearms rated 7/10 aching; she takes tramadol about 3 times per week to relieve this pain.   She does have some erythromelalgias of her feet, but denies aquagenic pruritis, or changes in finger/toe coloration.  She has vision-related headaches, no dizziness.  She reports mild early satiety, but reports that this is chronic and unchanged.  No nausea or abdominal pain. No blood clots.  She denies B-symptoms such as fever, chills, weight loss, and night sweats.  No new masses or lymphadenopathy per her report. She denies fatigue.  No tinnitus or dizziness.  She has not noticed any recent bleeding such as epistaxis, hematuria, melena, or hematochezia.  Denies recent chest pain on exertion, shortness of breath on minimal exertion, pre-syncopal episodes, or palpitations.   She has 90% energy and 90% appetite. She endorses that she is maintaining a stable weight.    REVIEW OF SYSTEMS:  Review of Systems  Constitutional:  Negative for appetite change, chills, diaphoresis, fatigue, fever and unexpected weight change.   HENT:   Negative for lump/mass and nosebleeds.   Eyes:  Negative for eye problems.  Respiratory:  Negative for cough, hemoptysis and shortness of breath.   Cardiovascular:  Negative for chest pain, leg swelling and palpitations.  Gastrointestinal:  Negative for abdominal pain, blood in stool, constipation, diarrhea, nausea and vomiting.  Genitourinary:  Negative for hematuria.   Skin: Negative.   Neurological:  Positive for headaches. Negative for dizziness and light-headedness.  Hematological:  Bruises/bleeds easily.  Psychiatric/Behavioral:  Positive for depression. Negative for suicidal ideas. The patient is nervous/anxious.      PAST MEDICAL/SURGICAL HISTORY:  Past Medical History:  Diagnosis Date   Abdominal pain, right upper quadrant 06/10/2018   Anxiety    Arthritis    Bursitis    DDD (degenerative disc disease)    Depression    Fibromyalgia    Obsessive-compulsive disorder    Overactive bladder    Polycythemia vera(238.4) January 2013   PTSD (post-traumatic stress disorder)    PVC's (premature ventricular contractions)    pt. placed on heart monitor x 24hours, everything fine    Sesamoiditis February 2017   Past Surgical History:  Procedure Laterality Date   ABDOMINAL HYSTERECTOMY     BIOPSY  01/04/2021   Procedure: BIOPSY;  Surgeon: [name], MD;  [facility];  Service: Gastroenterology;;  small bowel biopsy   COLONOSCOPY WITH PROPOFOL N/A 01/04/2021   Procedure: COLONOSCOPY WITH PROPOFOL;  Surgeon: [name], MD;  [facility]: [facility];  Service: Gastroenterology;  Laterality: N/A;  1115   ESOPHAGOGASTRODUODENOSCOPY N/A 09/10/2014   Procedure: ESOPHAGOGASTRODUODENOSCOPY (EGD);  Surgeon: Rogene Houston, MD;  [facility]:  [facility];  Service: Endoscopy;  Laterality: N/A;  130 - moved to 3:15 - Ann to notify   ESOPHAGOGASTRODUODENOSCOPY (EGD) WITH PROPOFOL N/A 01/04/2021   Procedure: ESOPHAGOGASTRODUODENOSCOPY (EGD) WITH  PROPOFOL;  Surgeon: [name], MD;  [facility];  Service: Gastroenterology;  Laterality: N/A;   LUMBAR FUSION     POLYPECTOMY  01/04/2021   Procedure: POLYPECTOMY INTESTINAL;  Surgeon: [name], MD;  [facility]: [facility];  Service: Gastroenterology;;  cecal lesion; ascending colon polyp;    TOOTH EXTRACTION Left Aug 2016     SOCIAL HISTORY:  Social History   Socioeconomic History   Marital status: Widowed    Spouse name: Not on file   Number of children: 0   Years of education: BA   Highest education level: Not on file  Occupational History   Occupation: [employer]- on leave (since 08/2020)  Tobacco Use   Smoking status: Every Day    Packs/day: 1.00    Years: 15.00    Pack years: 15.00    Types: Cigarettes    Start date: 11/12/1996   Smokeless tobacco: Never   Tobacco comments:    1 pack a day x 20 yrs.  Vaping Use   Vaping Use: Never used  Substance and Sexual Activity   Alcohol use: No    Alcohol/week: 0.0 standard drinks   Drug use: No    Types: Hydrocodone, Benzodiazepines   Sexual activity: Not on file  Other Topics Concern   Not on file  Social History Narrative   Right handed   2-3 glasses soda per day   Lives alone   Social Determinants of Health   Financial Resource Strain: Not on file  Food Insecurity: Not on file  Transportation Needs: Not on file  Physical Activity: Not on file  Stress: Not on file  Social Connections: Not on file  Intimate Partner Violence: Not on file    FAMILY HISTORY:  Family History  Problem Relation Age of Onset   Cancer - Colon Mother        age 45   Anxiety disorder Mother    Diabetes Mother    Atrial fibrillation Father    Lung cancer Father    Atrial fibrillation Brother    Bipolar disorder Neg Hx    Dementia Neg Hx    Drug abuse Neg Hx    Paranoid behavior Neg Hx    Schizophrenia Neg Hx    Seizures Neg Hx    Sexual abuse Neg Hx    Physical  abuse Neg Hx     CURRENT MEDICATIONS:  Outpatient Encounter Medications as of 05/01/2021  Medication Sig   aspirin 81 MG tablet Take 1 tablet (81 mg total) by mouth daily. For Polycythemia Vera.   clonazePAM (KLONOPIN) 0.5 MG tablet Take 1 tablet (0.5 mg total) by mouth 3 (three) times daily as needed for anxiety.   dicyclomine (BENTYL) 10 MG capsule Take 1 capsule (10 mg total) by mouth every 12 (twelve) hours as needed (abdominal pain).   sertraline (ZOLOFT) 100 MG tablet TAKE 1 AND 1/2 TABLETS(150 MG) BY MOUTH DAILY   traMADol (ULTRAM) 50 MG tablet Take 0.5 tablets (25 mg total) by mouth every 12 (twelve) hours as needed for moderate pain (has polycythemia, reports severe arthralgias intermittently).   traZODone (DESYREL) 50 MG tablet Take 1 tablet (50 mg total) by mouth at bedtime.   Vitamin A 2400 MCG (8000 UT) CAPS Take 2,400 mcg by  mouth daily with lunch.   vitamin B-12 (CYANOCOBALAMIN) 500 MCG tablet Take 500 mcg by mouth daily with lunch.   No facility-administered encounter medications on file as of 05/01/2021.    ALLERGIES:  Allergies  Allergen Reactions   Gabapentin Hives   Hydrocodone-Acetaminophen Nausea Only   Penicillins Other (See Comments)    Unknown child hood reaction .Did it involve swelling of the face/tongue/throat, SOB, or low BP? Unknown Did it involve sudden or severe rash/hives, skin peeling, or any reaction on the inside of your mouth or nose? Unknown Did you need to seek medical attention at a hospital or doctor's office? Unknown When did it last happen?       If all above answers are "NO", may proceed with cephalosporin use.      PHYSICAL EXAM:  ECOG PERFORMANCE STATUS: 1 - Symptomatic but completely ambulatory  There were no vitals filed for this visit. There were no vitals filed for this visit. Physical Exam Constitutional:      Appearance: Normal appearance.  HENT:     Head: Normocephalic and atraumatic.     Mouth/Throat:     Mouth: Mucous  membranes are moist.  Eyes:     Extraocular Movements: Extraocular movements intact.     Pupils: Pupils are equal, round, and reactive to light.  Cardiovascular:     Rate and Rhythm: Normal rate and regular rhythm.     Pulses: Normal pulses.     Heart sounds: Normal heart sounds.  Pulmonary:     Effort: Pulmonary effort is normal.     Breath sounds: Normal breath sounds.  Abdominal:     General: Bowel sounds are normal.     Palpations: Abdomen is soft.     Tenderness: There is no abdominal tenderness.  Musculoskeletal:        General: No swelling.     Right lower leg: No edema.     Left lower leg: No edema.  Lymphadenopathy:     Cervical: No cervical adenopathy.  Skin:    General: Skin is warm and dry.  Neurological:     General: No focal deficit present.     Mental Status: She is alert and oriented to person, place, and time.  Psychiatric:        Mood and Affect: Mood normal.        Behavior: Behavior normal.     LABORATORY DATA:  I have reviewed the labs as listed.  CBC    Component Value Date/Time   WBC 4.5 04/24/2021 1115   RBC 4.41 04/24/2021 1115   HGB 13.3 04/24/2021 1115   HGB 12.9 06/04/2018 0920   HGB 12.8 06/05/2013 1327   HCT 40.6 04/24/2021 1115   HCT 40.2 06/04/2018 0920   HCT 38.6 06/05/2013 1327   PLT 225 04/24/2021 1115   PLT 317 06/04/2018 0920   MCV 92.1 04/24/2021 1115   MCV 93 06/04/2018 0920   MCV 89.6 06/05/2013 1327   MCH 30.2 04/24/2021 1115   MCHC 32.8 04/24/2021 1115   RDW 13.1 04/24/2021 1115   RDW 14.4 06/04/2018 0920   RDW 14.4 06/05/2013 1327   LYMPHSABS 1.8 04/24/2021 1115   LYMPHSABS 1.8 06/04/2018 0920   LYMPHSABS 2.3 06/05/2013 1327   MONOABS 0.3 04/24/2021 1115   MONOABS 0.6 06/05/2013 1327   EOSABS 0.1 04/24/2021 1115   EOSABS 0.2 06/04/2018 0920   BASOSABS 0.0 04/24/2021 1115   BASOSABS 0.0 06/04/2018 0920   BASOSABS 0.1 06/05/2013 1327   CMP Latest  Ref Rng & Units 04/24/2021 12/22/2020 10/13/2020  Glucose 70 - 99  mg/dL 89 99 -  BUN 6 - 20 mg/dL 5(L) 6 -  Creatinine 0.44 - 1.00 mg/dL 0.75 0.62 -  Sodium 135 - 145 mmol/L 138 133(L) -  Potassium 3.5 - 5.1 mmol/L 3.8 3.8 -  Chloride 98 - 111 mmol/L 104 98 -  CO2 22 - 32 mmol/L 28 28 -  Calcium 8.9 - 10.3 mg/dL 8.9 8.8(L) -  Total Protein 6.5 - 8.1 g/dL 6.6 6.8 6.4  Total Bilirubin 0.3 - 1.2 mg/dL 0.4 0.4 -  Alkaline Phos 38 - 126 U/L 75 59 -  AST 15 - 41 U/L 13(L) 21 -  ALT 0 - 44 U/L 11 20 -    DIAGNOSTIC IMAGING:  I have independently reviewed the relevant imaging and discussed with the patient.  ASSESSMENT & PLAN: 1.  Polycythemia (JAK2 negative) - Longstanding diagnosis of polycythemia - Mutational testing was negative for JAK2 V617F, but she has never had CALR/MPL testing done - Bone marrow biopsy (07/12/2005): Hypercellular marrow consistent with polycythemia - No history of thrombosis - 30-pack-year active current smoker - Patient has had phlebotomy almost every 4 months since diagnosis, symptoms (hot flashes and night sweats, joint and muscle pain) usually improve after phlebotomies - Continues to take aspirin 81 mg daily - Takes tramadol about 3 times per week for joint pains related to polycythemia - Most recent labs (04/24/2021): Hgb 13.3, hematocrit 40.6 - PLAN: Labs today to check for CALR/MPL/JAK exon mutations.  No phlebotomy today (see asymptomatic and hematocrit at goal).  RTC in 3 months or sooner If needed for symptoms.  2.  Weight loss - Has had poor appetite related to loss of husband - Weight has been stable over the past several months - PLAN: Continue to monitor at follow-up appointments  3.  Tobacco use - Current everyday smoker - PLAN: Discussed referring patient for [place] lung cancer screening program, but she does not want to do this yet.  Reports that she "has too much going on," as she is helping to care for her father with stage IV lung cancer.  We will discuss again at her next appointment   PLAN SUMMARY &  DISPOSITION: -Labs today (CALR/MPL/JAK2 testing) - RTC in 3 months with same-day labs  All questions were answered. The patient knows to call the clinic with any problems, questions or concerns.  Medical decision making: Low  Time spent on visit: I spent 20 minutes counseling the patient face to face. The total time spent in the appointment was 30 minutes and more than 50% was on counseling.   Harriett Rush, PA-C  05/01/21 5:14 PM

## 2021-05-01 NOTE — Patient Instructions (Signed)
[place] at [facility] Discharge Instructions  You were seen today by Tarri Abernethy PA-C for your polycythemia.  Your labs looked good today, so no need for phlebotomy at this time.  We will check you again in 3 months, but don't hesitate to call if you need to be re-checked sooner.   We will also check some other labs today to try to identify the mutation that might be causing your polycythemia.   Thank you for choosing [place] at [facility] to provide your oncology and hematology care.  To afford each patient quality time with our provider, please arrive at least 15 minutes before your scheduled appointment time.   If you have a lab appointment with the [facility] please come in thru the Main Entrance and check in at the main information desk.  You need to re-schedule your appointment should you arrive 10 or more minutes late.  We strive to give you quality time with our providers, and arriving late affects you and other patients whose appointments are after yours.  Also, if you no show three or more times for appointments you may be dismissed from the clinic at the providers discretion.     Again, thank you for choosing [facility].  Our hope is that these requests will decrease the amount of time that you wait before being seen by our physicians.       _____________________________________________________________  Should you have questions after your visit to [facility], please contact our office at [phone] and follow the prompts.  Our office hours are 8:00 a.m. and 4:30 p.m. Monday - Friday.  Please note that voicemails left after 4:00 p.m. may not be returned until the following business day.  We are closed weekends and major holidays.  You do have access to a nurse 24-7, just call the main number to the clinic [phone] and do not press any options, hold on the line and a nurse will answer the  phone.    For prescription refill requests, have your pharmacy contact our office and allow 72 hours.    Due to Covid, you will need to wear a mask upon entering the hospital. If you do not have a mask, a mask will be given to you at the Main Entrance upon arrival. For doctor visits, patients may have 1 support person age 38 or older with them. For treatment visits, patients can not have anyone with them due to social distancing guidelines and our immunocompromised population.

## 2021-05-11 LAB — JAK2 V617F, W REFLEX TO CALR/E12/MPL

## 2021-05-11 LAB — CALR + JAK2 E12-15 + MPL (REFLEXED)

## 2021-06-21 ENCOUNTER — Encounter (HOSPITAL_COMMUNITY): Payer: Self-pay | Admitting: Psychiatry

## 2021-06-21 ENCOUNTER — Other Ambulatory Visit: Payer: Self-pay

## 2021-06-21 ENCOUNTER — Telehealth (INDEPENDENT_AMBULATORY_CARE_PROVIDER_SITE_OTHER): Payer: BC Managed Care – PPO | Admitting: Psychiatry

## 2021-06-21 DIAGNOSIS — F5105 Insomnia due to other mental disorder: Secondary | ICD-10-CM | POA: Diagnosis not present

## 2021-06-21 DIAGNOSIS — F321 Major depressive disorder, single episode, moderate: Secondary | ICD-10-CM | POA: Diagnosis not present

## 2021-06-21 DIAGNOSIS — F418 Other specified anxiety disorders: Secondary | ICD-10-CM

## 2021-06-21 DIAGNOSIS — F429 Obsessive-compulsive disorder, unspecified: Secondary | ICD-10-CM | POA: Diagnosis not present

## 2021-06-21 MED ORDER — TRAZODONE HCL 50 MG PO TABS: 50.0000 mg | ORAL_TABLET | Freq: Every day | ORAL | 3 refills | Status: DC

## 2021-06-21 MED ORDER — CLONAZEPAM 0.5 MG PO TABS: 0.5000 mg | ORAL_TABLET | Freq: Three times a day (TID) | ORAL | 2 refills | Status: DC | PRN

## 2021-06-21 MED ORDER — SERTRALINE HCL 100 MG PO TABS: ORAL_TABLET | ORAL | 3 refills | Status: DC

## 2021-06-21 NOTE — Progress Notes (Signed)
Virtual Visit via Telephone Note  I connected with Cheryl Cross on 06/21/21 at  1:00 PM EDT by telephone and verified that I am speaking with the correct person using two identifiers.  Location: Patient: home Provider: home office   I discussed the limitations, risks, security and privacy concerns of performing an evaluation and management service by telephone and the availability of in person appointments. I also discussed with the patient that there may be a patient responsible charge related to this service. The patient expressed understanding and agreed to proceed.      I discussed the assessment and treatment plan with the patient. The patient was provided an opportunity to ask questions and all were answered. The patient agreed with the plan and demonstrated an understanding of the instructions.   The patient was advised to call back or seek an in-person evaluation if the symptoms worsen or if the condition fails to improve as anticipated.  I provided 15 minutes of non-face-to-face time during this encounter.   [name], MD  [facility] MD/PA/NP OP Progress Note  06/21/2021 1:26 PM Cheryl Cross  MRN:  [id number]  Chief Complaint:  Chief Complaint   Anxiety; Depression; Follow-up    HPI: This patient is a 55 year old widowed white female who lives alone in [place].  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  She still has a lot of feelings of discouragement and anxiety.  Her father who has stage IV lung cancer seems to be declining.  He did not respond well to his last treatment in fact developed pneumonitis and is not sure he will return to any sort of treatment.  He is getting progressively weaker.  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  She is applied for Social Security disability and is awaiting the  outcome.  She denies being significantly depressed or suicidal.  She is sleeping and eating well.  However she feels like she is in a "holding pattern."  I offered her counseling in our office but she declined at this point.  She is enjoying some things like spending time with her nieces at the pool. Visit Diagnosis:    ICD-10-CM   1. Insomnia secondary to depression with anxiety  F51.05 traZODone (DESYREL) 50 MG tablet   F41.8     2. Major depressive disorder, single episode, moderate (HCC)  F32.1 sertraline (ZOLOFT) 100 MG tablet    3. 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 placed on heart monitor x 24hours, everything fine    Sesamoiditis February 2017    Past Surgical History:  Procedure Laterality Date   ABDOMINAL HYSTERECTOMY     BIOPSY  01/04/2021   Procedure: BIOPSY;  Surgeon: [name], MD;  [facility];  Service: Gastroenterology;;  small  bowel biopsy   COLONOSCOPY WITH PROPOFOL N/A 01/04/2021   Procedure: COLONOSCOPY WITH PROPOFOL;  Surgeon: [name], MD;  [facility];  Service: Gastroenterology;  Laterality: N/A;  1115   ESOPHAGOGASTRODUODENOSCOPY N/A 09/10/2014   Procedure: ESOPHAGOGASTRODUODENOSCOPY (EGD);  Surgeon: Rogene Houston, MD;  [facility];  Service: Endoscopy;  Laterality: N/A;  130 - moved to 3:15 - Ann to notify   ESOPHAGOGASTRODUODENOSCOPY (EGD) WITH PROPOFOL N/A 01/04/2021    Procedure: ESOPHAGOGASTRODUODENOSCOPY (EGD) WITH PROPOFOL;  Surgeon: [name], MD;  [facility];  Service: Gastroenterology;  Laterality: N/A;   LUMBAR FUSION     POLYPECTOMY  01/04/2021   Procedure: POLYPECTOMY INTESTINAL;  Surgeon: [name], MD;  [facility];  Service: Gastroenterology;;  cecal lesion; ascending colon polyp;    TOOTH EXTRACTION Left Aug 2016    Family Psychiatric History: see below  Family History:  Family History  Problem Relation Age of Onset   Cancer - Colon Mother        age 36   Anxiety disorder Mother    Diabetes Mother    Atrial fibrillation Father    Lung cancer Father    Atrial fibrillation Brother    Bipolar disorder Neg Hx    Dementia Neg Hx    Drug abuse Neg Hx    Paranoid behavior Neg Hx    Schizophrenia Neg Hx    Seizures Neg Hx    Sexual abuse Neg Hx    Physical abuse Neg Hx     Social History:  Social History   Socioeconomic History   Marital status: Widowed    Spouse name: Not on file   Number of children: 0   Years of education: BA   Highest education level: Not on file  Occupational History   Occupation: [employer]- on leave (since 08/2020)  Tobacco Use   Smoking status: Every Day    Packs/day: 1.00    Years: 15.00    Pack years: 15.00    Types: Cigarettes    Start date: 11/12/1996   Smokeless tobacco: Never   Tobacco comments:    1 pack a day x 20 yrs.  Vaping Use   Vaping Use: Never used  Substance and Sexual Activity   Alcohol use: No    Alcohol/week: 0.0 standard drinks   Drug use: No    Types: Hydrocodone, Benzodiazepines   Sexual activity: Not on file  Other Topics Concern   Not on file  Social History Narrative   Right handed   2-3 glasses soda per day   Lives alone   Social Determinants of Health   Financial Resource Strain: Not on file  Food Insecurity: Not on file  Transportation Needs: Not on file  Physical Activity: Not  on file  Stress: Not on file  Social Connections: Not on file    Allergies:  Allergies  Allergen Reactions   Gabapentin Hives   Hydrocodone-Acetaminophen Nausea Only   Penicillins Other (See Comments)    Unknown child hood reaction .Did it involve swelling of the face/tongue/throat, SOB, or low BP? Unknown Did it involve sudden or severe rash/hives, skin peeling, or any reaction on the inside of your mouth or nose? Unknown Did you need to seek medical attention at a hospital or doctor's office? Unknown When did it last happen?       If all above answers are "NO", may proceed with cephalosporin use.     Metabolic  Disorder Labs: No results found for: HGBA1C, MPG No results found for: PROLACTIN No results found for: CHOL, TRIG, HDL, CHOLHDL, VLDL, LDLCALC Lab Results  Component Value Date   TSH 1.770 10/13/2020   TSH 1.490 05/28/2016    Therapeutic Level Labs: No results found for: LITHIUM No results found for: VALPROATE No components found for:  CBMZ  Current Medications: Current Outpatient Medications  Medication Sig Dispense Refill   acetaminophen (TYLENOL) 325 MG tablet Take 2 tablets by mouth every 4 (four) hours as needed.     aspirin 81 MG tablet Take 1 tablet (81 mg total) by mouth daily. For Polycythemia Vera.     clonazePAM (KLONOPIN) 0.5 MG tablet Take 1 tablet (0.5 mg total) by mouth 3 (three) times daily as needed for anxiety. 90 tablet 2   dicyclomine (BENTYL) 10 MG capsule Take 1 capsule (10 mg total) by mouth every 12 (twelve) hours as needed (abdominal pain). (Patient not taking: Reported on 05/01/2021) 60 capsule 2   sertraline (ZOLOFT) 100 MG tablet TAKE 1 AND 1/2 TABLETS(150 MG) BY MOUTH DAILY 45 tablet 3   traMADol (ULTRAM) 50 MG tablet Take 0.5 tablets (25 mg total) by mouth every 12 (twelve) hours as needed for moderate pain (has polycythemia, reports severe arthralgias intermittently). 30 tablet 0   traZODone (DESYREL) 50 MG tablet Take 1 tablet (50 mg  total) by mouth at bedtime. 30 tablet 3   vitamin B-12 (CYANOCOBALAMIN) 500 MCG tablet Take 500 mcg by mouth daily with lunch.     No current facility-administered medications for this visit.     Musculoskeletal: Strength & Muscle Tone: within normal limits Gait & Station: normal Patient leans: N/A  Psychiatric Specialty Exam: Review of Systems  Eyes:  Positive for visual disturbance.  Psychiatric/Behavioral:  The patient is nervous/anxious.   All other systems reviewed and are negative.  There were no vitals taken for this visit.There is no height or weight on file to calculate BMI.  General Appearance: NA  Eye Contact:  NA  Speech:  Clear and Coherent  Volume:  Normal  Mood:  Anxious  Affect:  NA  Thought Process:  Goal Directed  Orientation:  Full (Time, Place, and Person)  Thought Content: Rumination   Suicidal Thoughts:  No  Homicidal Thoughts:  No  Memory:  Immediate;   Good Recent;   Good Remote;   Good  Judgement:  Good  Insight:  Good  Psychomotor Activity:  Decreased  Concentration:  Concentration: Fair and Attention Span: Fair  Recall:  Good  Fund of Knowledge: Good  Language: Good  Akathisia:  No  Handed:  Right  AIMS (if indicated): not done  Assets:  Communication Skills Desire for Improvement Resilience Social Support Talents/Skills  ADL's:  Intact  Cognition: WNL  Sleep:  Good   Screenings: GAD-7    Flowsheet Row Counselor from 01/13/2021 in [place] [place] [facility]  Total GAD-7 Score 17      PHQ2-9    Flowsheet Row Video Visit from 06/21/2021 in [place] [facility] Video Visit from 04/26/2021 in [place] [facility] Video Visit from 03/14/2021 in [place] [facility] Video Visit from 02/06/2021 in [place] Counselor from 01/13/2021 in [place]  [facility]  PHQ-2 Total Score '1 1 3 2 3  '$ PHQ-9 Total Score -- -- '4 3 15      '$ Flowsheet Row Video Visit from 06/21/2021 in [place] [facility] Video  Visit from 04/26/2021 in [place] Video Visit from 03/14/2021 in [place] [facility]  C-SSRS RISK CATEGORY No Risk No Risk No Risk        Assessment and Plan: Patient is a 55 year old female with a history of substance abuse in remission depression insomnia and significant visual impairment.  She is still complaining of a good deal of worry and anxiety.  She declines counseling.  She only intermittently uses the clonazepam 0.5 mg 3 times daily as needed and I urged her to take this if she feels anxious.  She will continue trazodone 50 mg at bedtime which is helping her sleep and Zoloft 150 mg daily for depression.  She will return to see me in 6 weeks   [name], MD 06/21/2021, 1:26 PM

## 2021-06-26 ENCOUNTER — Ambulatory Visit (INDEPENDENT_AMBULATORY_CARE_PROVIDER_SITE_OTHER): Payer: PRIVATE HEALTH INSURANCE | Admitting: Gastroenterology

## 2021-07-01 ENCOUNTER — Emergency Department (HOSPITAL_COMMUNITY)
Admission: EM | Admit: 2021-07-01 | Discharge: 2021-07-01 | Disposition: A | Discharge status: Home / Self Care | Payer: BC Managed Care – PPO | Attending: Emergency Medicine | Admitting: Emergency Medicine

## 2021-07-01 ENCOUNTER — Emergency Department (HOSPITAL_COMMUNITY): Payer: BC Managed Care – PPO

## 2021-07-01 ENCOUNTER — Encounter (HOSPITAL_COMMUNITY): Payer: Self-pay

## 2021-07-01 ENCOUNTER — Other Ambulatory Visit: Payer: Self-pay

## 2021-07-01 DIAGNOSIS — Z7982 Long term (current) use of aspirin: Secondary | ICD-10-CM | POA: Insufficient documentation

## 2021-07-01 DIAGNOSIS — F17210 Nicotine dependence, cigarettes, uncomplicated: Secondary | ICD-10-CM | POA: Insufficient documentation

## 2021-07-01 DIAGNOSIS — R109 Unspecified abdominal pain: Secondary | ICD-10-CM

## 2021-07-01 DIAGNOSIS — N3000 Acute cystitis without hematuria: Secondary | ICD-10-CM | POA: Insufficient documentation

## 2021-07-01 DIAGNOSIS — Z20822 Contact with and (suspected) exposure to covid-19: Secondary | ICD-10-CM | POA: Insufficient documentation

## 2021-07-01 LAB — COMPREHENSIVE METABOLIC PANEL
ALT: 10 U/L (ref 0–44)
AST: 11 U/L — ABNORMAL LOW (ref 15–41)
Albumin: 4.7 g/dL (ref 3.5–5.0)
Alkaline Phosphatase: 79 U/L (ref 38–126)
Anion gap: 7 (ref 5–15)
BUN: 5 mg/dL — ABNORMAL LOW (ref 6–20)
CO2: 29 mmol/L (ref 22–32)
Calcium: 9.2 mg/dL (ref 8.9–10.3)
Chloride: 97 mmol/L — ABNORMAL LOW (ref 98–111)
Creatinine, Ser: 0.83 mg/dL (ref 0.44–1.00)
GFR, Estimated: >60 mL/min (ref >60)
Glucose, Bld: 95 mg/dL (ref 70–99)
Potassium: 3.4 mmol/L — ABNORMAL LOW (ref 3.5–5.1)
Sodium: 133 mmol/L — ABNORMAL LOW (ref 135–145)
Total Bilirubin: 0.6 mg/dL (ref 0.3–1.2)
Total Protein: 7.7 g/dL (ref 6.5–8.1)

## 2021-07-01 LAB — CBC WITH DIFFERENTIAL/PLATELET
Abs Immature Granulocytes: 0.02 10*3/uL (ref 0.00–0.07)
Basophils Absolute: 0 10*3/uL (ref 0.0–0.1)
Basophils Relative: 1 %
Eosinophils Absolute: 0.1 10*3/uL (ref 0.0–0.5)
Eosinophils Relative: 1 %
HCT: 45.3 % (ref 36.0–46.0)
Hemoglobin: 15 g/dL (ref 12.0–15.0)
Immature Granulocytes: 0 %
Lymphocytes Relative: 38 %
Lymphs Abs: 1.8 10*3/uL (ref 0.7–4.0)
MCH: 30.4 pg (ref 26.0–34.0)
MCHC: 33.1 g/dL (ref 30.0–36.0)
MCV: 91.7 fL (ref 80.0–100.0)
Monocytes Absolute: 0.3 10*3/uL (ref 0.1–1.0)
Monocytes Relative: 7 %
Neutro Abs: 2.6 10*3/uL (ref 1.7–7.7)
Neutrophils Relative %: 53 %
Platelets: 212 10*3/uL (ref 150–400)
RBC: 4.94 MIL/uL (ref 3.87–5.11)
RDW: 12.7 % (ref 11.5–15.5)
WBC: 4.9 10*3/uL (ref 4.0–10.5)
nRBC: 0 % (ref 0.0–0.2)

## 2021-07-01 LAB — URINALYSIS, ROUTINE W REFLEX MICROSCOPIC
Bacteria, UA: NONE SEEN
Bilirubin Urine: NEGATIVE
Glucose, UA: NEGATIVE mg/dL
Ketones, ur: NEGATIVE mg/dL
Nitrite: NEGATIVE
Protein, ur: NEGATIVE mg/dL
Specific Gravity, Urine: 1.002 — ABNORMAL LOW (ref 1.005–1.030)
pH: 6 (ref 5.0–8.0)

## 2021-07-01 LAB — RESP PANEL BY RT-PCR (FLU A&B, COVID) ARPGX2
Influenza A by PCR: NEGATIVE
Influenza B by PCR: NEGATIVE
SARS Coronavirus 2 by RT PCR: NEGATIVE

## 2021-07-01 LAB — POC URINE PREG, ED: Preg Test, Ur: NEGATIVE

## 2021-07-01 IMAGING — CT CT RENAL STONE PROTOCOL
2 of 4 series · 16 of 46 positions shown, 18 images · non-contrast
Comparison: [DATE]

CLINICAL DATA: Right flank pain, all urea

EXAM:
CT ABDOMEN AND PELVIS WITHOUT CONTRAST
TECHNIQUE: Multidetector CT imaging of the abdomen and pelvis was performed
following the standard protocol without IV contrast.

[Series 2: axial st · axial · 0.63mm/px · z∈[+957,+1302]mm · 13 of 81 slices shown, 15 images]
[im 6/81  soft-tissue]
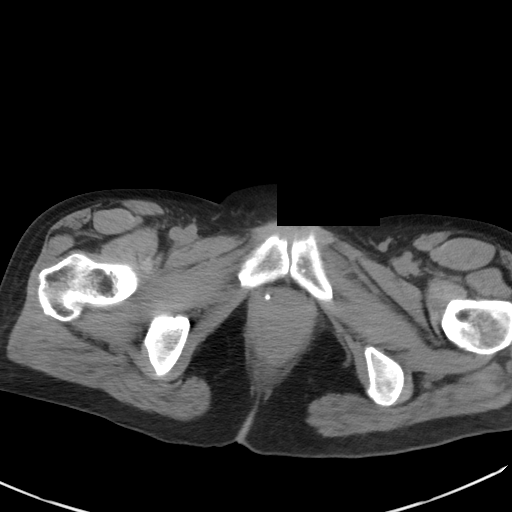
[im 6/81  bone]
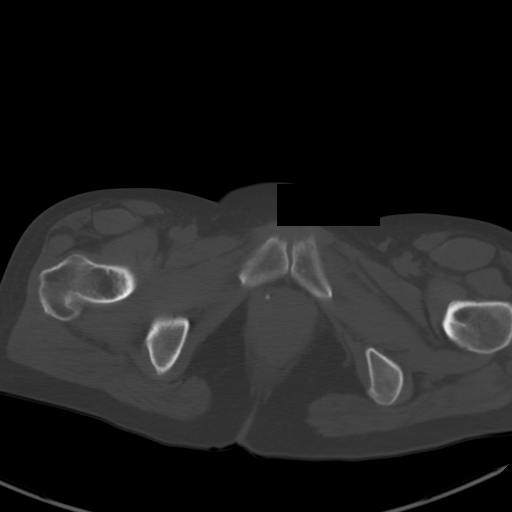
[im 11/81  soft-tissue]
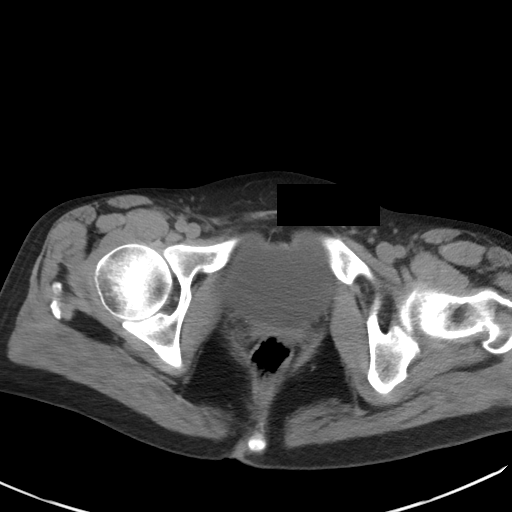
[im 17/81  soft-tissue]
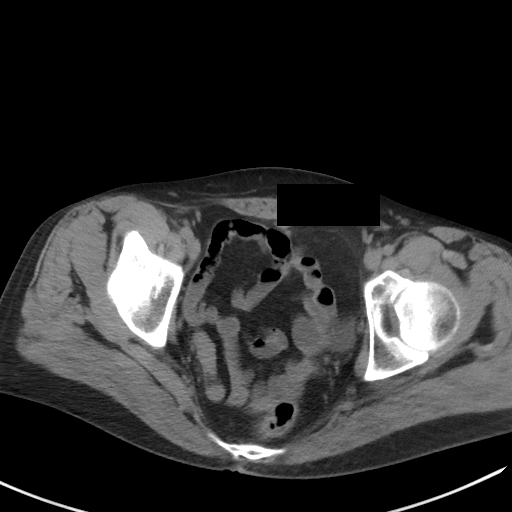
[im 22/81  soft-tissue]
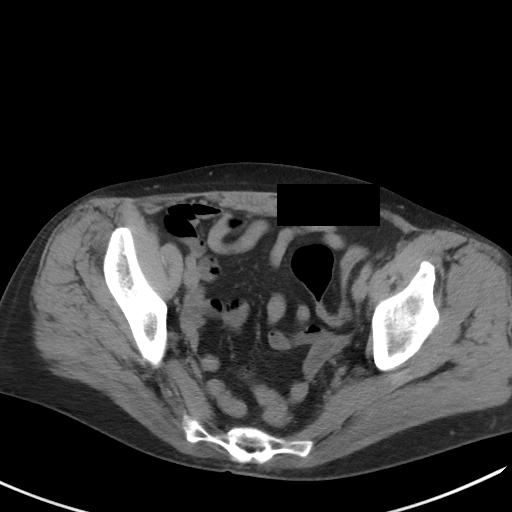
[im 27/81  soft-tissue]
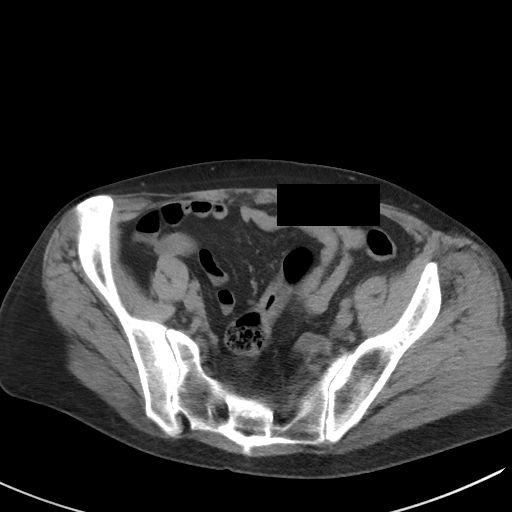
[im 33/81  soft-tissue]
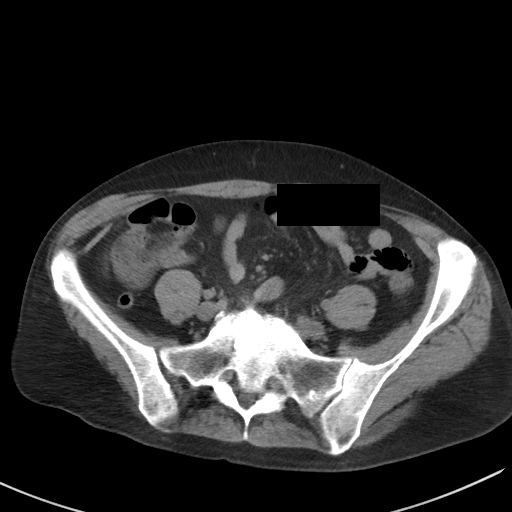
[im 43/81  soft-tissue]
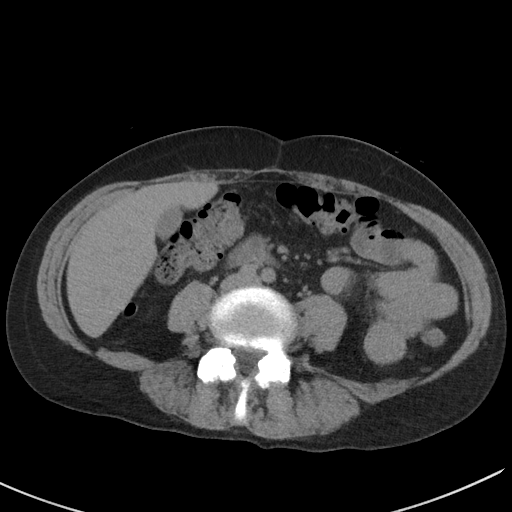
[im 49/81  soft-tissue]
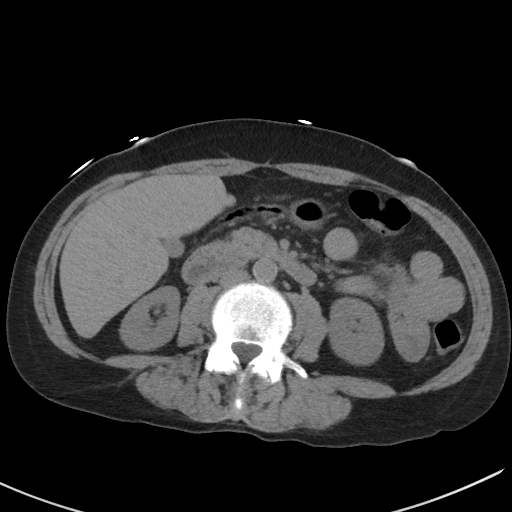
[im 54/81  soft-tissue]
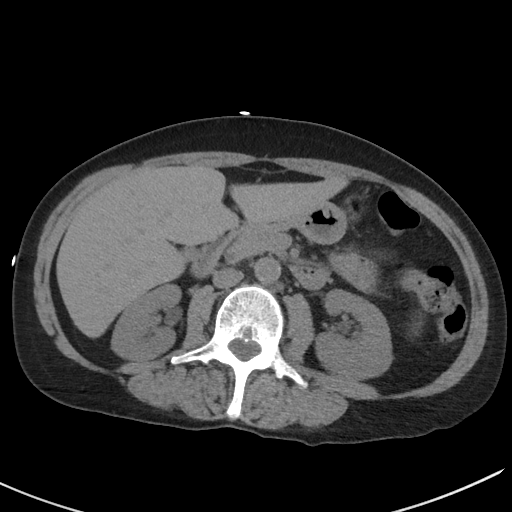
[im 54/81  bone]
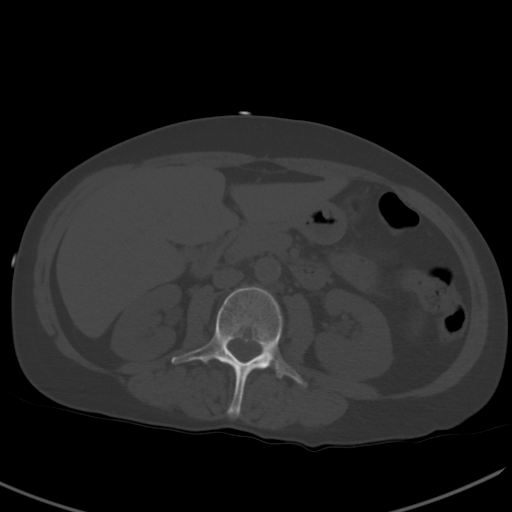
[im 59/81  soft-tissue]
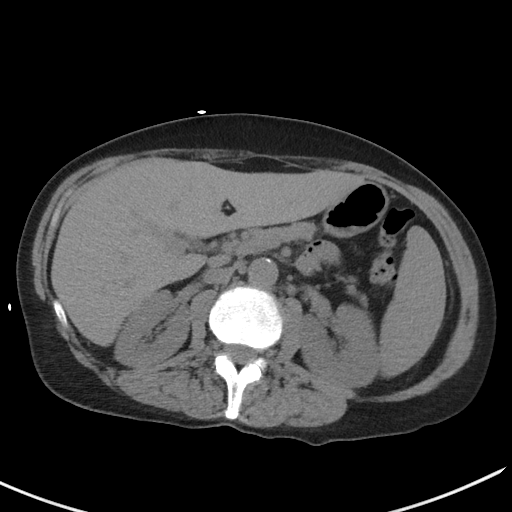
[im 65/81  soft-tissue]
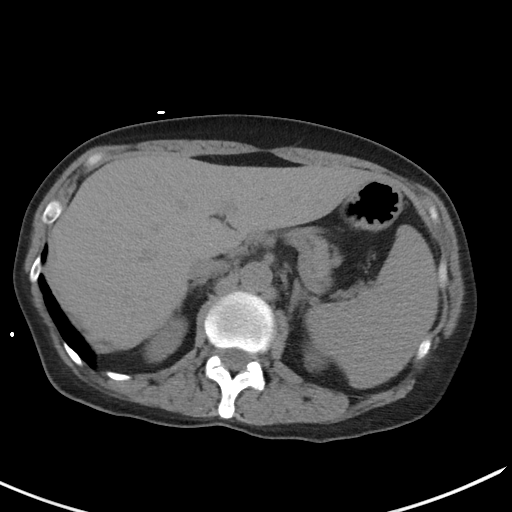
[im 70/81  soft-tissue]
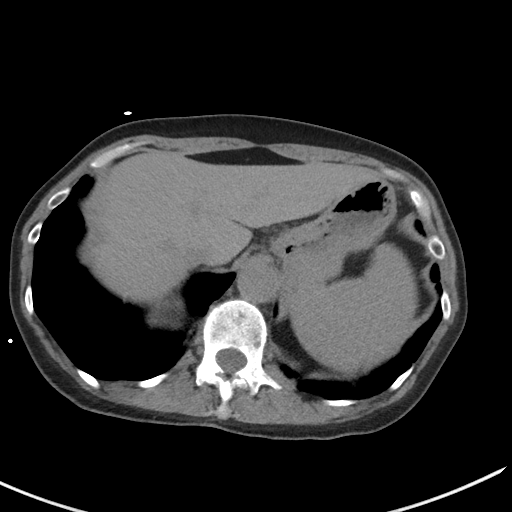
[im 75/81  soft-tissue]
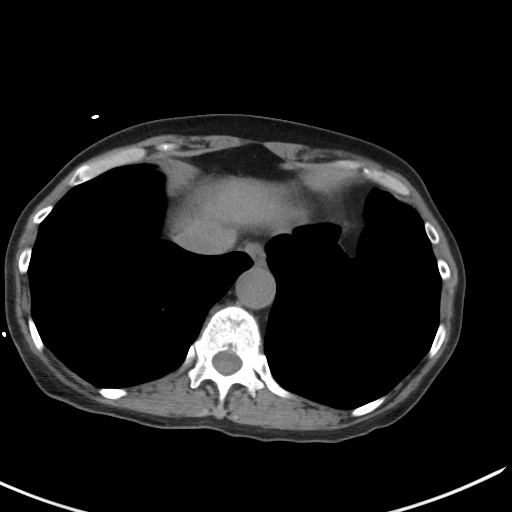

[Series 5: coronal st · coronal · 0.68mm/px · 3 of 64 slices shown]
[im 22/64  soft-tissue]
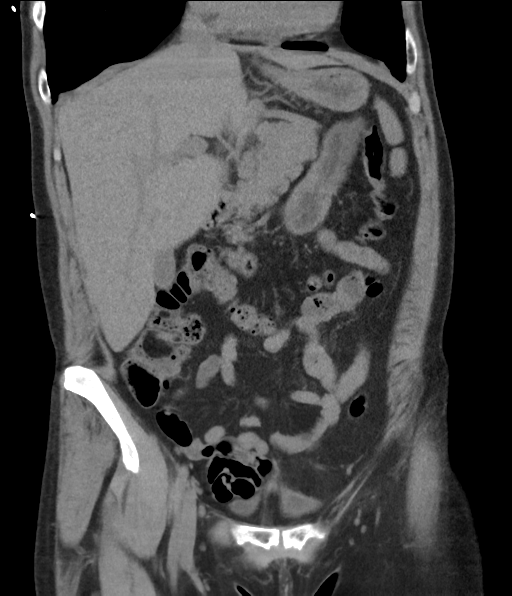
[im 29/64  soft-tissue]
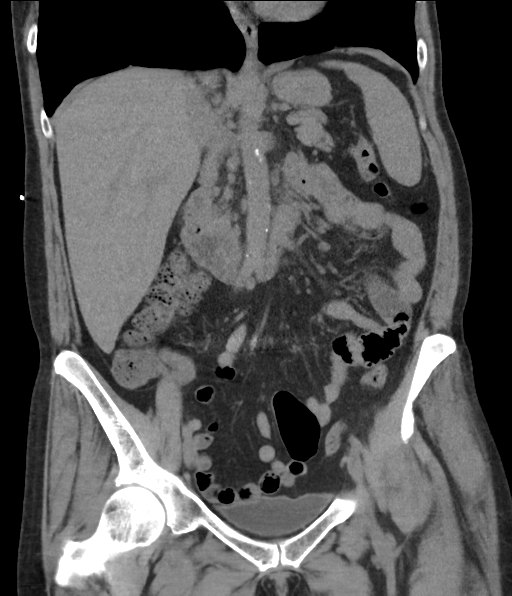
[im 36/64  soft-tissue]
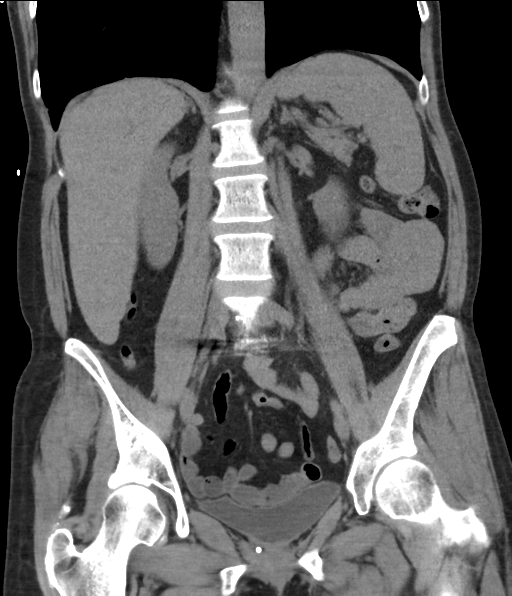

[16 of 46 positions shown; findings below may reference images not displayed]

FINDINGS: Lower chest: No acute abnormality.

Hepatobiliary: No focal liver abnormality is seen. No gallstones,
gallbladder wall thickening, or biliary dilatation.

Pancreas: Unremarkable

Spleen: Unremarkable

Adrenals/Urinary Tract: Adrenal glands are unremarkable. Kidneys are
normal, without renal calculi, focal lesion, or hydronephrosis.
Bladder is unremarkable.

Stomach/Bowel: Stomach is within normal limits. Appendix appears
normal. No evidence of bowel wall thickening, distention, or
inflammatory changes. No free intraperitoneal gas or fluid.

Vascular/Lymphatic: Mild aortoiliac atherosclerotic calcification.
No aortic aneurysm. No pathologic adenopathy within the abdomen and
pelvis.

Reproductive: Status post hysterectomy. No adnexal masses.

Other: No abdominal wall hernia.  Rectum unremarkable.

Musculoskeletal: No acute bone abnormality. L5-S1 anterior lumbar
fusion with instrumentation has been performed. No lytic or blastic
bone lesion.
IMPRESSION: No acute intra-abdominal pathology identified. No definite
radiographic explanation for the patient's reported symptoms.

Aortic Atherosclerosis ([SA]-[SA]).

## 2021-07-01 MED ORDER — SODIUM CHLORIDE 0.9 % IV SOLN: INTRAVENOUS | Status: DC

## 2021-07-01 MED ORDER — ONDANSETRON HCL 4 MG/2ML IJ SOLN
4.0000 mg | Freq: Once | INTRAMUSCULAR | Status: AC
  Administered 2021-07-01: 4 mg via INTRAVENOUS
  Filled 2021-07-01: qty 2

## 2021-07-01 MED ORDER — CIPROFLOXACIN HCL 500 MG PO TABS: 500.0000 mg | ORAL_TABLET | Freq: Two times a day (BID) | ORAL | 0 refills | Status: AC

## 2021-07-01 NOTE — ED Provider Notes (Signed)
[facility] EMERGENCY DEPARTMENT Provider Note   CSN: [id number] Arrival date & time: 07/01/21  1807     History Chief Complaint  Patient presents with   Flank Pain    Cheryl Cross is a 55 y.o. female.  Patient with a complaint of right flank pain all day.  Associated with some nausea no vomiting.  Also a little bit of dysuria.  Questionable fever.  No cough no upper respiratory symptoms.  Patient does not have a past history of kidney stones.  Has had some difficulty with urinary tract infections.      Past Medical History:  Diagnosis Date   Abdominal pain, right upper quadrant 06/10/2018   Anxiety    Arthritis    Bursitis    DDD (degenerative disc disease)    Depression    Fibromyalgia    Obsessive-compulsive disorder    Overactive bladder    Polycythemia vera(238.4) January 2013   PTSD (post-traumatic stress disorder)    PVC's (premature ventricular contractions)    pt. 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   Depression with anxiety 10/13/2020   Major depressive disorder, single episode, moderate ([place]) 04/19/2020   Cervical radiculitis 08/13/2018   Abdominal pain, right upper quadrant 06/10/2018   OCD (obsessive compulsive disorder) 02/25/2013   Insomnia secondary to depression with anxiety 02/25/2013   Benzodiazepine dependence ([place]) 03/13/2012   Major depressive disorder 03/13/2012   Polycythemia vera ([place]) 12/07/2011   ABDOMINAL PAIN, EPIGASTRIC 03/28/2010   IBS 01/30/2010   ABDOMINAL PAIN, LEFT LOWER QUADRANT 01/30/2010    Past Surgical History:  Procedure Laterality Date   ABDOMINAL HYSTERECTOMY     BIOPSY  01/04/2021   Procedure: BIOPSY;  Surgeon: [name], MD;  [facility];  Service: Gastroenterology;;  small bowel biopsy    COLONOSCOPY WITH PROPOFOL N/A 01/04/2021   Procedure: COLONOSCOPY WITH PROPOFOL;  Surgeon: [name], MD;  [facility];  Service: Gastroenterology;  Laterality: N/A;  1115   ESOPHAGOGASTRODUODENOSCOPY N/A 09/10/2014   Procedure: ESOPHAGOGASTRODUODENOSCOPY (EGD);  Surgeon: Rogene Houston, MD;  [facility];  Service: Endoscopy;  Laterality: N/A;  130 - moved to 3:15 - Ann to notify   ESOPHAGOGASTRODUODENOSCOPY (EGD) WITH PROPOFOL N/A 01/04/2021   Procedure: ESOPHAGOGASTRODUODENOSCOPY (EGD) WITH PROPOFOL;  Surgeon: [name], MD;  [facility];  Service: Gastroenterology;  Laterality: N/A;   LUMBAR FUSION     POLYPECTOMY  01/04/2021   Procedure: POLYPECTOMY INTESTINAL;  Surgeon: Montez Morita, Quillian Quince, MD;  [facility];  Service: Gastroenterology;;  cecal lesion; ascending colon polyp;    TOOTH EXTRACTION Left Aug 2016     OB History     Gravida      Para      Term      Preterm      AB      Living  0      SAB      IAB      Ectopic      Multiple      Live Births              Family History  Problem Relation Age of Onset   Cancer - Colon Mother        age 36   Anxiety  disorder Mother    Diabetes Mother    Atrial fibrillation Father    Lung cancer Father    Atrial fibrillation Brother    Bipolar disorder Neg Hx    Dementia Neg Hx    Drug abuse Neg Hx    Paranoid behavior Neg Hx    Schizophrenia Neg Hx    Seizures Neg Hx    Sexual abuse Neg Hx    Physical abuse Neg Hx     Social History   Tobacco Use   Smoking status: Every Day    Packs/day: 1.00    Years: 15.00    Pack years: 15.00    Types: Cigarettes    Start date: 11/12/1996   Smokeless tobacco: Never   Tobacco comments:    1 pack a day x 20 yrs.  Vaping Use   Vaping Use: Never used  Substance Use Topics   Alcohol use: No    Alcohol/week: 0.0 standard drinks   Drug use: No    Types: Hydrocodone, Benzodiazepines     Home Medications Prior to Admission medications   Medication Sig Start Date End Date Taking? Authorizing Provider  ciprofloxacin (CIPRO) 500 MG tablet Take 1 tablet (500 mg total) by mouth 2 (two) times daily for 7 days. 07/01/21 07/08/21 Yes Fredia Sorrow, MD  acetaminophen (TYLENOL) 325 MG tablet Take 2 tablets by mouth every 4 (four) hours as needed.    [provider]  aspirin 81 MG tablet Take 1 tablet (81 mg total) by mouth daily. For Polycythemia Vera. 03/15/12   Greig Castilla, FNP  clonazePAM (KLONOPIN) 0.5 MG tablet Take 1 tablet (0.5 mg total) by mouth 3 (three) times daily as needed for anxiety. 06/21/21 06/21/22  Cloria Spring, MD  dicyclomine (BENTYL) 10 MG capsule Take 1 capsule (10 mg total) by mouth every 12 (twelve) hours as needed (abdominal pain). Patient not taking: Reported on 05/01/2021 12/26/20   Montez Morita, Quillian Quince, MD  sertraline (ZOLOFT) 100 MG tablet TAKE 1 AND 1/2 TABLETS(150 MG) BY MOUTH DAILY 06/21/21   Cloria Spring, MD  traMADol (ULTRAM) 50 MG tablet Take 0.5 tablets (25 mg total) by mouth every 12 (twelve) hours as needed for moderate pain (has polycythemia, reports severe arthralgias intermittently). 05/01/21   Harriett Rush, PA-C  traZODone (DESYREL) 50 MG tablet Take 1 tablet (50 mg total) by mouth at bedtime. 06/21/21   Cloria Spring, MD  vitamin B-12 (CYANOCOBALAMIN) 500 MCG tablet Take 500 mcg by mouth daily with lunch.    [provider]    Allergies    Gabapentin, Hydrocodone-acetaminophen, and Penicillins  Review of Systems   Review of Systems  Constitutional:  Positive for fever. Negative for chills.  HENT:  Negative for ear pain and sore throat.   Eyes:  Negative for pain and visual disturbance.  Respiratory:  Negative for cough and shortness of breath.   Cardiovascular:  Negative for chest pain and palpitations.  Gastrointestinal:  Positive for nausea. Negative for abdominal pain and vomiting.   Genitourinary:  Positive for dysuria and flank pain. Negative for hematuria.  Musculoskeletal:  Negative for arthralgias and back pain.  Skin:  Negative for color change and rash.  Neurological:  Negative for seizures and syncope.  All other systems reviewed and are negative.  Physical Exam Updated Vital Signs BP (!) 115/33   Pulse 64   Temp 98.1 F (36.7 C) (Oral)   Resp 15   Ht 1.702 m ('5\' 7"'$ )   Wt  56.5 kg   SpO2 97%   BMI 19.52 kg/m   Physical Exam Vitals and nursing note reviewed.  Constitutional:      General: She is not in acute distress.    Appearance: Normal appearance. She is well-developed.  HENT:     Head: Normocephalic and atraumatic.  Eyes:     Extraocular Movements: Extraocular movements intact.     Conjunctiva/sclera: Conjunctivae normal.     Pupils: Pupils are equal, round, and reactive to light.  Cardiovascular:     Rate and Rhythm: Normal rate and regular rhythm.     Heart sounds: No murmur heard. Pulmonary:     Effort: Pulmonary effort is normal. No respiratory distress.     Breath sounds: Normal breath sounds.  Abdominal:     General: There is no distension.     Palpations: Abdomen is soft.     Tenderness: There is no abdominal tenderness. There is no guarding.  Musculoskeletal:        General: No swelling. Normal range of motion.     Cervical back: Normal range of motion and neck supple.  Skin:    General: Skin is warm and dry.     Capillary Refill: Capillary refill takes less than 2 seconds.  Neurological:     General: No focal deficit present.     Mental Status: She is alert and oriented to person, place, and time.     Cranial Nerves: No cranial nerve deficit.     Sensory: No sensory deficit.     Motor: No weakness.    ED Results / Procedures / Treatments   Labs (all labs ordered are listed, but only abnormal results are displayed) Labs Reviewed  URINALYSIS, ROUTINE W REFLEX MICROSCOPIC - Abnormal; Notable for the following  components:      Result Value   Color, Urine STRAW (*)    Specific Gravity, Urine 1.002 (*)    Hgb urine dipstick SMALL (*)    Leukocytes,Ua LARGE (*)    All other components within normal limits  COMPREHENSIVE METABOLIC PANEL - Abnormal; Notable for the following components:   Sodium 133 (*)    Potassium 3.4 (*)    Chloride 97 (*)    BUN 5 (*)    AST 11 (*)    All other components within normal limits  RESP PANEL BY RT-PCR (FLU A&B, COVID) [id number]  URINE CULTURE  CBC WITH DIFFERENTIAL/PLATELET  POC URINE PREG, ED    EKG None  Radiology CT Renal Stone Study  Result Date: 07/01/2021 CLINICAL DATA:  Right flank pain, all urea EXAM: CT ABDOMEN AND PELVIS WITHOUT CONTRAST TECHNIQUE: Multidetector CT imaging of the abdomen and pelvis was performed following the standard protocol without IV contrast. COMPARISON:  11/12/2016 FINDINGS: Lower chest: No acute abnormality. Hepatobiliary: No focal liver abnormality is seen. No gallstones, gallbladder wall thickening, or biliary dilatation. Pancreas: Unremarkable Spleen: Unremarkable Adrenals/Urinary Tract: Adrenal glands are unremarkable. Kidneys are normal, without renal calculi, focal lesion, or hydronephrosis. Bladder is unremarkable. Stomach/Bowel: Stomach is within normal limits. Appendix appears normal. No evidence of bowel wall thickening, distention, or inflammatory changes. No free intraperitoneal gas or fluid. Vascular/Lymphatic: Mild aortoiliac atherosclerotic calcification. No aortic aneurysm. No pathologic adenopathy within the abdomen and pelvis. Reproductive: Status post hysterectomy. No adnexal masses. Other: No abdominal wall hernia.  Rectum unremarkable. Musculoskeletal: No acute bone abnormality. L5-S1 anterior lumbar fusion with instrumentation has been performed. No lytic or blastic bone lesion. IMPRESSION: No acute intra-abdominal pathology identified. No definite radiographic  explanation for the patient's reported symptoms. Aortic  Atherosclerosis (ICD10-I70.0). Electronically Signed   By: Fidela Salisbury M.D.   On: 07/01/2021 21:51    Procedures Procedures   Medications Ordered in ED Medications  0.9 %  sodium chloride infusion ( Intravenous New Bag/Given 07/01/21 2122)  ondansetron [facility]) injection 4 mg (4 mg Intravenous Given 07/01/21 2122)    ED Course  I have reviewed the triage vital signs and the nursing notes.  Pertinent labs & imaging results that were available during my care of the patient were reviewed by me and considered in my medical decision making (see chart for details).    MDM Rules/Calculators/A&P                           Work-up for the right flank pain without any significant findings.  Fair amount of white blood cells.  In the urine.  11-20.  Based on her dysuria we will go ahead and treat urine sent for culture.  Patient has a penicillin allergy.  Not sure if she can take cephalosporins so she will be treated with Cipro.  Pregnancy test was negative.  No leukocytosis.  CT scan of the abdomen showed no evidence of any kidney stones.  Appendix was also normal on that side no significant abnormalities.  Patient has primary care doctor to follow-up with.  Will return for any new or worse symptoms.   Final Clinical Impression(s) / ED Diagnoses Final diagnoses:  Acute cystitis without hematuria  Right flank pain    Rx / DC Orders ED Discharge Orders          Ordered    ciprofloxacin (CIPRO) 500 MG tablet  2 times daily        07/01/21 2322             Fredia Sorrow, MD 07/01/21 2325

## 2021-07-01 NOTE — ED Triage Notes (Signed)
Pt. States they have been having right flank pain all day today. Pt states for a few days they haven't felt good. Pt. States they believe they have had decreased urination.

## 2021-07-01 NOTE — Discharge Instructions (Addendum)
Take the antibiotic Cipro for the potential urinary tract infection.  Urine sent for culture.  Rest of work-up to include CT scan of the abdomen without any acute findings.  Return for any new or worse symptoms.

## 2021-07-03 LAB — URINE CULTURE: Culture: <10000 — AB

## 2021-07-06 ENCOUNTER — Encounter (HOSPITAL_COMMUNITY): Payer: Self-pay

## 2021-07-06 ENCOUNTER — Inpatient Hospital Stay (HOSPITAL_COMMUNITY): Payer: BC Managed Care – PPO | Attending: Hematology

## 2021-07-06 ENCOUNTER — Other Ambulatory Visit: Payer: Self-pay

## 2021-07-06 DIAGNOSIS — D751 Secondary polycythemia: Secondary | ICD-10-CM | POA: Diagnosis present

## 2021-07-06 NOTE — Progress Notes (Signed)
Phlebotomy given today per MD orders. Tolerated infusion without adverse affects. Vital signs stable. No complaints at this time. Discharged from clinic ambulatory in stable condition. Alert and oriented x 3. F/U with [facility] as scheduled.

## 2021-07-06 NOTE — Progress Notes (Signed)
Cheryl Cross presents today for phlebotomy per MD orders. Phlebotomy procedure started at 1405 and ended at 1410. 20 grams removed. Patient observed for 20 minutes after procedure without any incident. Patient tolerated procedure well. IV needle removed intact.

## 2021-07-06 NOTE — Patient Instructions (Signed)
Cheryl Cross  Discharge Instructions: Thank you for choosing [facility] to provide your oncology and hematology care.  If you have a lab appointment with the [facility], please come in thru the Main Entrance and check in at the main information desk.  Wear comfortable clothing and clothing appropriate for easy access to any Portacath or PICC line.   We strive to give you quality time with your provider. You may need to reschedule your appointment if you arrive late (15 or more minutes).  Arriving late affects you and other patients whose appointments are after yours.  Also, if you miss three or more appointments without notifying the office, you may be dismissed from the clinic at the provider's discretion.      For prescription refill requests, have your pharmacy contact our office and allow 72 hours for refills to be completed.    Today you received the following chemotherapy and/or immunotherapy agents phlebotomy  Therapeutic Phlebotomy, Care After This sheet gives you information about how to care for yourself after your procedure. Your health care provider may also give you more specific instructions. If you have problems or questions, contact your health careprovider. What can I expect after the procedure? After the procedure, it is common to have: Light-headedness or dizziness. You may feel faint. Nausea. Tiredness (fatigue). Follow these instructions at home: Eating and drinking Be sure to eat well-balanced meals for the next 24 hours. Drink enough fluid to keep your urine pale yellow. Avoid drinking alcohol on the day that you had the procedure. Activity  Return to your normal activities as told by your health care provider. Most people can go back to their normal activities right away. Avoid activities that take a lot of effort for about 5 hours after the procedure. Athletes should avoid strenuous exercise for at least 12 hours. Avoid heavy lifting  or pulling for about 5 hours after the procedure. Do not lift anything that is heavier than 10 lb (4.5 kg). Change positions slowly for the remainder of the day. This will help to prevent light-headedness or fainting. If you feel light-headed, lie down until the feeling goes away.  Needle insertion site care  Keep your bandage (dressing) dry. You can remove the bandage after about 5 hours or as told by your health care provider. If you have bleeding from the needle insertion site, raise (elevate) your arm and press firmly on the site until the bleeding stops. If you have bruising at the site, apply ice to the area: Remove the dressing. Put ice in a plastic bag. Place a towel between your skin and the bag. Leave the ice on for 20 minutes, 2-3 times a day for the first 24 hours. If the swelling does not go away after 24 hours, apply a warm, moist cloth (warm compress) to the area for 20 minutes, 2-3 times a day.  General instructions Do not use any products that contain nicotine or tobacco, such as cigarettes and e-cigarettes, for at least 30 minutes after the procedure. Keep all follow-up visits as told by your health care provider. This is important. You may need to continue having regular therapeutic phlebotomy treatments as directed. Contact a health care provider if you: Have redness, swelling, or pain at the needle insertion site. Have fluid or blood coming from the needle insertion site. Have pus or a bad smell coming from the needle insertion site. Notice that the needle insertion site feels warm to the touch. Feel light-headed,  dizzy, or nauseous, and the feeling does not go away. Have new bruising at the needle insertion site. Feel weaker than normal. Have a fever or chills. Get help right away if: You faint. You have chest pain. You have trouble breathing. You have severe nausea or vomiting. Summary After the procedure, it is common to have some light-headedness, dizziness,  nausea, or tiredness (fatigue). Be sure to eat well-balanced meals for the next 24 hours. Drink enough fluid to keep your urine pale yellow. Return to your normal activities as told by your health care provider. Keep all follow-up visits as told by your health care provider. You may need to continue having regular therapeutic phlebotomy treatments as directed. This information is not intended to replace advice given to you by your health care provider. Make sure you discuss any questions you have with your healthcare provider. Document Revised: 11/15/2017 Document Reviewed: 11/14/2017 Elsevier Patient Education  2022 [employer].       To help prevent nausea and vomiting after your treatment, we encourage you to take your nausea medication as directed.  BELOW ARE SYMPTOMS THAT SHOULD BE REPORTED IMMEDIATELY: *FEVER GREATER THAN 100.4 F (38 C) OR HIGHER *CHILLS OR SWEATING *NAUSEA AND VOMITING THAT IS NOT CONTROLLED WITH YOUR NAUSEA MEDICATION *UNUSUAL SHORTNESS OF BREATH *UNUSUAL BRUISING OR BLEEDING *URINARY PROBLEMS (pain or burning when urinating, or frequent urination) *BOWEL PROBLEMS (unusual diarrhea, constipation, pain near the anus) TENDERNESS IN MOUTH AND THROAT WITH OR WITHOUT PRESENCE OF ULCERS (sore throat, sores in mouth, or a toothache) UNUSUAL RASH, SWELLING OR PAIN  UNUSUAL VAGINAL DISCHARGE OR ITCHING   Items with * indicate a potential emergency and should be followed up as soon as possible or go to the Emergency Department if any problems should occur.  Please show the CHEMOTHERAPY ALERT CARD or IMMUNOTHERAPY ALERT CARD at check-in to the Emergency Department and triage nurse.  Should you have questions after your visit or need to cancel or reschedule your appointment, please contact [facility] [phone]  and follow the prompts.  Office hours are 8:00 a.m. to 4:30 p.m. Monday - Friday. Please note that voicemails left after 4:00 p.m. may not be  returned until the following business day.  We are closed weekends and major holidays. You have access to a nurse at all times for urgent questions. Please call the main number to the clinic [phone] and follow the prompts.  For any non-urgent questions, you may also contact your provider using MyChart. We now offer e-Visits for anyone 92 and older to request care online for non-urgent symptoms. For details visit [email or web address].   Also download the MyChart app! Go to the app store, search "MyChart", open the app, select [facility], and log in with your MyChart username and password.  Due to Covid, a mask is required upon entering the hospital/clinic. If you do not have a mask, one will be given to you upon arrival. For doctor visits, patients may have 1 support person aged 30 or older with them. For treatment visits, patients cannot have anyone with them due to current Covid guidelines and our immunocompromised population.

## 2021-07-11 ENCOUNTER — Other Ambulatory Visit (HOSPITAL_COMMUNITY): Payer: Self-pay | Admitting: Psychiatry

## 2021-07-11 DIAGNOSIS — F321 Major depressive disorder, single episode, moderate: Secondary | ICD-10-CM

## 2021-07-11 DIAGNOSIS — F429 Obsessive-compulsive disorder, unspecified: Secondary | ICD-10-CM

## 2021-07-13 ENCOUNTER — Telehealth (HOSPITAL_COMMUNITY): Payer: Self-pay | Admitting: *Deleted

## 2021-07-13 NOTE — Telephone Encounter (Signed)
Patient dropped of form for provider to complete. Provider completed form and staff informed patient form was avaliable for pick up. Patient came to office to pick up the from. Right when patient was about to leave the office, she stated the number on question 1 under section C that states "This form represents a report of earnings received during the month of". Per pt provider needed to have put 07-2021 instead of 06-2021. Information was provided to provider and she informed staff that that date was not entered in by her. That date must have been entered in by patient and to call patient back to inform her. Per provider that date she should be the one that knows when her report of earnings are received for that month. Staff called patient and was not able to reach patient and [facility] for patient to call office. Staff message wants patient to call office to ask patient about that date before patient comes to office just incase provider needs to modify something. Office number provided on voicemail.   Patient called back and stated the date provider needed to change to 07-13-2021 is where she signed.   Staff placed form back in provider's box to change date and initial so that patient can come pick up the corrected form.

## 2021-07-14 ENCOUNTER — Ambulatory Visit (INDEPENDENT_AMBULATORY_CARE_PROVIDER_SITE_OTHER): Payer: BC Managed Care – PPO

## 2021-07-14 ENCOUNTER — Other Ambulatory Visit: Payer: Self-pay

## 2021-07-14 ENCOUNTER — Ambulatory Visit: Payer: BC Managed Care – PPO | Admitting: Podiatry

## 2021-07-14 DIAGNOSIS — M79671 Pain in right foot: Secondary | ICD-10-CM

## 2021-07-14 DIAGNOSIS — L600 Ingrowing nail: Secondary | ICD-10-CM

## 2021-07-14 DIAGNOSIS — M79672 Pain in left foot: Secondary | ICD-10-CM

## 2021-07-14 DIAGNOSIS — M778 Other enthesopathies, not elsewhere classified: Secondary | ICD-10-CM | POA: Diagnosis not present

## 2021-07-14 DIAGNOSIS — M779 Enthesopathy, unspecified: Secondary | ICD-10-CM

## 2021-07-14 MED ORDER — TRAMADOL HCL 50 MG PO TABS: 50.0000 mg | ORAL_TABLET | Freq: Three times a day (TID) | ORAL | 0 refills | Status: AC | PRN

## 2021-07-14 NOTE — Patient Instructions (Signed)

## 2021-07-17 NOTE — Progress Notes (Signed)
Subjective:   Patient ID: Cheryl Cross, female   DOB: 55 y.o.   MRN: [id number]   HPI Patient presents with chronic deformed nailbeds 1-3 4 and 5 both feet with the right hurting more stating she simply cannot cut her nails anymore or reach them.  Also is having a lot of pain in the joints of both feet with the second joint right being the worst and also the left with prominent plantar metatarsal bones that are hard for her to walk on and she needs to be active due to health history.  Patient does not smoke likes to be active   ROS      Objective:  Physical Exam  Neurovascular status found to be intact muscle strength was found to be adequate with patient found to have incurvated deformed nailbeds 1-3 4 and 5 right over left foot that are painful with no active redness or drainage noted.  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  Also discussed the probability long-term for orthotics and injection treatment right which I can do at the same time.  Today I went ahead and I anesthetized with 300 mg Xylocaine Marcaine mixture sterile prep was done aspirated the second MPJ getting out a small amount of clear fluid injected quarter cc dexamethasone Kenalog and then removed nailbeds 1-3 4-5 on the right foot exposed the nail bed and applied chemical agent 5 applications to the hallux and 4 applications to the nailbeds to 345 phenol followed by alcohol lavage sterile dressings and reappoint 3 weeks  for correction of the left foot and probable orthotic treatment or injection  x-rays indicate that there is moderate elongation deformity of the second and third metatarsals bilateral with no indications of arthritis stress fracture

## 2021-07-20 ENCOUNTER — Other Ambulatory Visit: Payer: Self-pay | Admitting: Podiatry

## 2021-07-20 DIAGNOSIS — M779 Enthesopathy, unspecified: Secondary | ICD-10-CM

## 2021-07-24 ENCOUNTER — Other Ambulatory Visit: Payer: Self-pay

## 2021-07-24 ENCOUNTER — Inpatient Hospital Stay (HOSPITAL_COMMUNITY): Payer: BC Managed Care – PPO | Attending: Physician Assistant

## 2021-07-24 ENCOUNTER — Other Ambulatory Visit (HOSPITAL_COMMUNITY): Payer: BC Managed Care – PPO

## 2021-07-24 DIAGNOSIS — F17210 Nicotine dependence, cigarettes, uncomplicated: Secondary | ICD-10-CM | POA: Insufficient documentation

## 2021-07-24 DIAGNOSIS — D45 Polycythemia vera: Secondary | ICD-10-CM | POA: Diagnosis present

## 2021-07-24 DIAGNOSIS — Z7982 Long term (current) use of aspirin: Secondary | ICD-10-CM | POA: Insufficient documentation

## 2021-07-24 DIAGNOSIS — Z634 Disappearance and death of family member: Secondary | ICD-10-CM | POA: Insufficient documentation

## 2021-07-24 DIAGNOSIS — R634 Abnormal weight loss: Secondary | ICD-10-CM | POA: Diagnosis not present

## 2021-07-24 LAB — COMPREHENSIVE METABOLIC PANEL
ALT: 12 U/L (ref 0–44)
AST: 13 U/L — ABNORMAL LOW (ref 15–41)
Albumin: 4.2 g/dL (ref 3.5–5.0)
Alkaline Phosphatase: 70 U/L (ref 38–126)
Anion gap: 6 (ref 5–15)
BUN: 5 mg/dL — ABNORMAL LOW (ref 6–20)
CO2: 29 mmol/L (ref 22–32)
Calcium: 8.9 mg/dL (ref 8.9–10.3)
Chloride: 103 mmol/L (ref 98–111)
Creatinine, Ser: 0.81 mg/dL (ref 0.44–1.00)
GFR, Estimated: >60 mL/min (ref >60)
Glucose, Bld: 106 mg/dL — ABNORMAL HIGH (ref 70–99)
Potassium: 3.5 mmol/L (ref 3.5–5.1)
Sodium: 138 mmol/L (ref 135–145)
Total Bilirubin: 0.3 mg/dL (ref 0.3–1.2)
Total Protein: 6.6 g/dL (ref 6.5–8.1)

## 2021-07-24 LAB — CBC WITH DIFFERENTIAL/PLATELET
Abs Immature Granulocytes: 0.01 10*3/uL (ref 0.00–0.07)
Basophils Absolute: 0 10*3/uL (ref 0.0–0.1)
Basophils Relative: 1 %
Eosinophils Absolute: 0.1 10*3/uL (ref 0.0–0.5)
Eosinophils Relative: 3 %
HCT: 38.4 % (ref 36.0–46.0)
Hemoglobin: 12.5 g/dL (ref 12.0–15.0)
Immature Granulocytes: 0 %
Lymphocytes Relative: 38 %
Lymphs Abs: 2 10*3/uL (ref 0.7–4.0)
MCH: 31.1 pg (ref 26.0–34.0)
MCHC: 32.6 g/dL (ref 30.0–36.0)
MCV: 95.5 fL (ref 80.0–100.0)
Monocytes Absolute: 0.4 10*3/uL (ref 0.1–1.0)
Monocytes Relative: 8 %
Neutro Abs: 2.6 10*3/uL (ref 1.7–7.7)
Neutrophils Relative %: 50 %
Platelets: 233 10*3/uL (ref 150–400)
RBC: 4.02 MIL/uL (ref 3.87–5.11)
RDW: 13.2 % (ref 11.5–15.5)
WBC: 5.1 10*3/uL (ref 4.0–10.5)
nRBC: 0 % (ref 0.0–0.2)

## 2021-07-24 LAB — LACTATE DEHYDROGENASE: LDH: 84 U/L — ABNORMAL LOW (ref 98–192)

## 2021-07-31 ENCOUNTER — Inpatient Hospital Stay (HOSPITAL_BASED_OUTPATIENT_CLINIC_OR_DEPARTMENT_OTHER): Payer: BC Managed Care – PPO | Admitting: Physician Assistant

## 2021-07-31 ENCOUNTER — Encounter (HOSPITAL_COMMUNITY): Payer: Self-pay | Admitting: Physician Assistant

## 2021-07-31 ENCOUNTER — Other Ambulatory Visit: Payer: Self-pay

## 2021-07-31 VITALS — BP 92/63 | HR 76 | Temp 98.0°F | Resp 18 | Wt 123.9 lb

## 2021-07-31 DIAGNOSIS — D45 Polycythemia vera: Secondary | ICD-10-CM | POA: Diagnosis not present

## 2021-07-31 NOTE — Progress Notes (Signed)
[place], [place] [address]   CLINIC:  Medical Oncology/Hematology  PCP:  [facility], [place] [address] [phone]   REASON FOR VISIT:  Follow-up for polycythemia (JAK2 negative)  CURRENT THERAPY: Intermittent phlebotomies (last phlebotomy 07/06/2021)  INTERVAL HISTORY:  Cheryl Cross 55 y.o. female returns for routine follow-up of her polycythemia.  She was last seen by Tarri Abernethy PA-C on 05/01/2021.  Her last phlebotomy was on 07/06/2021.  At today's visit, she reports feeling fair.  No recent hospitalizations, surgeries, or changes in baseline health status.  She does continue to have intermittent symptoms of hot flashes, erythromelalgia and feet, night sweats, and bone pain (aching pain in forearms, takes tramadol with some relief), but does report that these improve after phlebotomies.  Energy level improved after phlebotomy.  She denies any aquagenic pruritus, changes in finger/toe coloration, headache, dizziness, tinnitus.  She denies any B symptoms such as fever, chills, unintentional weight loss.  She continues to smoke 1 pack/day cigarettes.  She has 75% energy and 75% appetite. She endorses that she is maintaining a stable weight.   REVIEW OF SYSTEMS:  Review of Systems  Constitutional:  Positive for diaphoresis and fatigue. Negative for appetite change, chills, fever and unexpected weight change.  HENT:   Negative for lump/mass and nosebleeds.   Eyes:  Negative for eye problems.  Respiratory:  Negative for cough, hemoptysis and shortness of breath.   Cardiovascular:  Negative for chest pain, leg swelling and palpitations.  Gastrointestinal:  Negative for abdominal pain, blood in stool, constipation, diarrhea, nausea and vomiting.  Endocrine: Positive for hot flashes.  Genitourinary:  Negative for hematuria.   Musculoskeletal:  Positive for arthralgias.  Skin: Negative.    Neurological:  Negative for dizziness, headaches and light-headedness.  Hematological:  Does not bruise/bleed easily.     PAST MEDICAL/SURGICAL HISTORY:  Past Medical History:  Diagnosis Date   Abdominal pain, right upper quadrant 06/10/2018   Anxiety    Arthritis    Bursitis    DDD (degenerative disc disease)    Depression    Fibromyalgia    Obsessive-compulsive disorder    Overactive bladder    Polycythemia vera(238.4) January 2013   PTSD (post-traumatic stress disorder)    PVC's (premature ventricular contractions)    pt. placed on heart monitor x 24hours, everything fine    Sesamoiditis February 2017   Past Surgical History:  Procedure Laterality Date   ABDOMINAL HYSTERECTOMY     BIOPSY  01/04/2021   Procedure: BIOPSY;  Surgeon: [name], MD;  [facility];  Service: Gastroenterology;;  small bowel biopsy   COLONOSCOPY WITH PROPOFOL N/A 01/04/2021   Procedure: COLONOSCOPY WITH PROPOFOL;  Surgeon: [name], MD;  Location: [facility];  Service: Gastroenterology;  Laterality: N/A;  1115   ESOPHAGOGASTRODUODENOSCOPY N/A 09/10/2014   Procedure: ESOPHAGOGASTRODUODENOSCOPY (EGD);  Surgeon: Rogene Houston, MD;  Location: [facility];  Service: Endoscopy;  Laterality: N/A;  130 - moved to 3:15 - Ann to notify   ESOPHAGOGASTRODUODENOSCOPY (EGD) WITH PROPOFOL N/A 01/04/2021   Procedure: ESOPHAGOGASTRODUODENOSCOPY (EGD) WITH PROPOFOL;  Surgeon: [name], MD;  [facility]: [facility];  Service: Gastroenterology;  Laterality: N/A;   LUMBAR FUSION     POLYPECTOMY  01/04/2021   Procedure: POLYPECTOMY INTESTINAL;  Surgeon: [name], MD;  Location: [facility];  Service: Gastroenterology;;  cecal lesion; ascending colon polyp;    TOOTH EXTRACTION Left Aug  2016     SOCIAL HISTORY:  Social History   Socioeconomic History   Marital status: Widowed    Spouse name: Not on file   Number of children: 0    Years of education: BA   Highest education level: Not on file  Occupational History   Occupation: [employer]- on leave (since 08/2020)  Tobacco Use   Smoking status: Every Day    Packs/day: 1.00    Years: 15.00    Pack years: 15.00    Types: Cigarettes    Start date: 11/12/1996   Smokeless tobacco: Never   Tobacco comments:    1 pack a day x 20 yrs.  Vaping Use   Vaping Use: Never used  Substance and Sexual Activity   Alcohol use: No    Alcohol/week: 0.0 standard drinks   Drug use: No    Types: Hydrocodone, Benzodiazepines   Sexual activity: Not on file  Other Topics Concern   Not on file  Social History Narrative   Right handed   2-3 glasses soda per day   Lives alone   Social Determinants of Health   Financial Resource Strain: Not on file  Food Insecurity: Not on file  Transportation Needs: Not on file  Physical Activity: Not on file  Stress: Not on file  Social Connections: Not on file  Intimate Partner Violence: Not on file    FAMILY HISTORY:  Family History  Problem Relation Age of Onset   Cancer - Colon Mother        age 38   Anxiety disorder Mother    Diabetes Mother    Atrial fibrillation Father    Lung cancer Father    Atrial fibrillation Brother    Bipolar disorder Neg Hx    Dementia Neg Hx    Drug abuse Neg Hx    Paranoid behavior Neg Hx    Schizophrenia Neg Hx    Seizures Neg Hx    Sexual abuse Neg Hx    Physical abuse Neg Hx     CURRENT MEDICATIONS:  Outpatient Encounter Medications as of 07/31/2021  Medication Sig   acetaminophen (TYLENOL) 325 MG tablet Take 2 tablets by mouth every 4 (four) hours as needed.   aspirin 81 MG tablet Take 1 tablet (81 mg total) by mouth daily. For Polycythemia Vera.   clonazePAM (KLONOPIN) 0.5 MG tablet Take 1 tablet (0.5 mg total) by mouth 3 (three) times daily as needed for anxiety.   dicyclomine (BENTYL) 10 MG capsule Take 1 capsule (10 mg total) by mouth every 12 (twelve) hours as  needed (abdominal pain).   sertraline (ZOLOFT) 100 MG tablet TAKE 1 AND 1/2 TABLETS(150 MG) BY MOUTH DAILY   traMADol (ULTRAM) 50 MG tablet Take 0.5 tablets (25 mg total) by mouth every 12 (twelve) hours as needed for moderate pain (has polycythemia, reports severe arthralgias intermittently).   traZODone (DESYREL) 50 MG tablet Take 1 tablet (50 mg total) by mouth at bedtime.   vitamin B-12 (CYANOCOBALAMIN) 500 MCG tablet Take 500 mcg by mouth daily with lunch.   No facility-administered encounter medications on file as of 07/31/2021.    ALLERGIES:  Allergies  Allergen Reactions   Gabapentin Hives   Hydrocodone-Acetaminophen Nausea Only   Penicillins Other (See Comments)    Unknown child hood reaction .Did it involve swelling of the face/tongue/throat, SOB, or low BP? Unknown Did it involve sudden or severe rash/hives, skin peeling, or any reaction on the inside of your mouth or  nose? Unknown Did you need to seek medical attention at a hospital or doctor's office? Unknown When did it last happen?       If all above answers are "NO", may proceed with cephalosporin use.      PHYSICAL EXAM:  ECOG PERFORMANCE STATUS: 1 - Symptomatic but completely ambulatory  There were no vitals filed for this visit. There were no vitals filed for this visit. Physical Exam Constitutional:      Appearance: Normal appearance.  HENT:     Head: Normocephalic and atraumatic.     Mouth/Throat:     Mouth: Mucous membranes are moist.  Eyes:     Extraocular Movements: Extraocular movements intact.     Pupils: Pupils are equal, round, and reactive to light.  Cardiovascular:     Rate and Rhythm: Normal rate and regular rhythm.     Pulses: Normal pulses.     Heart sounds: Normal heart sounds.  Pulmonary:     Effort: Pulmonary effort is normal.     Breath sounds: Normal breath sounds.  Abdominal:     General: Bowel sounds are normal.     Palpations: Abdomen is soft.     Tenderness: There is no  abdominal tenderness.  Musculoskeletal:        General: No swelling.     Right lower leg: No edema.     Left lower leg: No edema.  Lymphadenopathy:     Cervical: No cervical adenopathy.     Upper Body:     Right upper body: Axillary adenopathy present.     Comments: Mild right axillary lymphadenopathy, tender to palpation.  Per patient, this is intermittent, and comes and goes.  Skin:    General: Skin is warm and dry.  Neurological:     General: No focal deficit present.     Mental Status: She is alert and oriented to person, place, and time.  Psychiatric:        Mood and Affect: Mood normal.        Behavior: Behavior normal.     LABORATORY DATA:  I have reviewed the labs as listed.  CBC    Component Value Date/Time   WBC 5.1 07/24/2021 0833   RBC 4.02 07/24/2021 0833   HGB 12.5 07/24/2021 0833   HGB 12.9 06/04/2018 0920   HGB 12.8 06/05/2013 1327   HCT 38.4 07/24/2021 0833   HCT 40.2 06/04/2018 0920   HCT 38.6 06/05/2013 1327   PLT 233 07/24/2021 0833   PLT 317 06/04/2018 0920   MCV 95.5 07/24/2021 0833   MCV 93 06/04/2018 0920   MCV 89.6 06/05/2013 1327   MCH 31.1 07/24/2021 0833   MCHC 32.6 07/24/2021 0833   RDW 13.2 07/24/2021 0833   RDW 14.4 06/04/2018 0920   RDW 14.4 06/05/2013 1327   LYMPHSABS 2.0 07/24/2021 0833   LYMPHSABS 1.8 06/04/2018 0920   LYMPHSABS 2.3 06/05/2013 1327   MONOABS 0.4 07/24/2021 0833   MONOABS 0.6 06/05/2013 1327   EOSABS 0.1 07/24/2021 0833   EOSABS 0.2 06/04/2018 0920   BASOSABS 0.0 07/24/2021 0833   BASOSABS 0.0 06/04/2018 0920   BASOSABS 0.1 06/05/2013 1327   CMP Latest Ref Rng & Units 07/24/2021 07/01/2021 04/24/2021  Glucose 70 - 99 mg/dL 106(H) 95 89  BUN 6 - 20 mg/dL 5(L) 5(L) 5(L)  Creatinine 0.44 - 1.00 mg/dL 0.81 0.83 0.75  Sodium 135 - 145 mmol/L 138 133(L) 138  Potassium 3.5 - 5.1 mmol/L 3.5 3.4(L) 3.8  Chloride 98 -  111 mmol/L 103 97(L) 104  CO2 22 - 32 mmol/L _0 Calcium 8.9 - 10.3 mg/dL 8.9 9.2 8.9  Total  Protein 6.5 - 8.1 g/dL 6.6 7.7 6.6  Total Bilirubin 0.3 - 1.2 mg/dL 0.3 0.6 0.4  Alkaline Phos 38 - 126 U/L 70 79 75  AST 15 - 41 U/L 13(L) 11(L) 13(L)  ALT 0 - 44 U/L _1 DIAGNOSTIC IMAGING:  I have independently reviewed the relevant imaging and discussed with the patient.  ASSESSMENT & PLAN: 1.  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) - Longstanding diagnosis of polycythemia - Mutational testing was negative for JAK2 V617F, JAK E12-15, CALR, and MPL - Bone marrow biopsy (07/12/2005): Hypercellular marrow consistent with diagnosis of polycythemia vera per pathologist report - No history of thrombosis - 30-pack-year active current smoker  - Patient has had phlebotomy almost every 4 months since diagnosis, symptoms (hot flashes and night sweats, bone pain) usually improve after phlebotomies - Continues to take aspirin 81 mg daily - Takes tramadol about 3 times per week for bone pains related to polycythemia - Most recent phlebotomy (07/06/2021) - Most recent labs (07/24/2021): Normal CBC with hemoglobin 12.5 and hematocrit 38.4.  Normal LDH.  Unremarkable CMP. - PLAN: Continue aspirin 81 mg daily with tramadol 3 times per week for joint pains from polycythemia. - Repeat CBC every 8 weeks with possible phlebotomy (if HCT > 40) - RTC in 4 months with repeat labs  2.  Weight loss - Has had poor appetite related to loss of husband - Weight has been stable over the past several months  - PLAN: Continue to monitor at follow-up appointments  3.  Tobacco use - Current everyday smoker, 1 PPD - PLAN: Discussed referring patient for [place] lung cancer screening program, but she does not want to do this yet.  Reports that she "has too much going on," as she is helping to care for her father with stage IV lung cancer.  We will discuss again at her next appointment     PLAN SUMMARY & DISPOSITION: -CBC with possible phlebotomy every 8 weeks - RTC in 4 months with repeat  CBC, CMP, LDH  All questions were answered. The patient knows to call the clinic with any problems, questions or concerns.  Medical decision making: Low  Time spent on visit: I spent 20 minutes counseling the patient face to face. The total time spent in the appointment was 30 minutes and more than 50% was on counseling.   Harriett Rush, PA-C  07/31/2021 5:14 PM

## 2021-07-31 NOTE — Patient Instructions (Signed)
[place] at [facility] Discharge Instructions  You were seen today by Tarri Abernethy PA-C for your polycythemia (elevated red blood cells).  Your blood cells today are normal, no need for phlebotomy today.  We will repeat CBC with possible phlebotomy every 8 weeks (if your hematocrit is greater than 40.0).  LABS: Return every 8 weeks for CBC  MEDICATIONS: No changes to home medications  FOLLOW-UP APPOINTMENT: Office visit in about 4 months   Thank you for choosing [place] at [facility] to provide your oncology and hematology care.  To afford each patient quality time with our provider, please arrive at least 15 minutes before your scheduled appointment time.   If you have a lab appointment with the [facility] please come in thru the Main Entrance and check in at the main information desk.  You need to re-schedule your appointment should you arrive 10 or more minutes late.  We strive to give you quality time with our providers, and arriving late affects you and other patients whose appointments are after yours.  Also, if you no show three or more times for appointments you may be dismissed from the clinic at the providers discretion.     Again, thank you for choosing [facility].  Our hope is that these requests will decrease the amount of time that you wait before being seen by our physicians.       _____________________________________________________________  Should you have questions after your visit to [facility], please contact our office at [phone] and follow the prompts.  Our office hours are 8:00 a.m. and 4:30 p.m. Monday - Friday.  Please note that voicemails left after 4:00 p.m. may not be returned until the following business day.  We are closed weekends and major holidays.  You do have access to a nurse 24-7, just call the main number to the clinic [phone] and do not press any  options, hold on the line and a nurse will answer the phone.    For prescription refill requests, have your pharmacy contact our office and allow 72 hours.    Due to Covid, you will need to wear a mask upon entering the hospital. If you do not have a mask, a mask will be given to you at the Main Entrance upon arrival. For doctor visits, patients may have 1 support person age 71 or older with them. For treatment visits, patients can not have anyone with them due to social distancing guidelines and our immunocompromised population.

## 2021-08-01 ENCOUNTER — Encounter (HOSPITAL_COMMUNITY): Payer: Self-pay | Admitting: Psychiatry

## 2021-08-01 ENCOUNTER — Telehealth (INDEPENDENT_AMBULATORY_CARE_PROVIDER_SITE_OTHER): Payer: BC Managed Care – PPO | Admitting: Psychiatry

## 2021-08-01 DIAGNOSIS — F321 Major depressive disorder, single episode, moderate: Secondary | ICD-10-CM

## 2021-08-01 DIAGNOSIS — F429 Obsessive-compulsive disorder, unspecified: Secondary | ICD-10-CM | POA: Diagnosis not present

## 2021-08-01 DIAGNOSIS — F418 Other specified anxiety disorders: Secondary | ICD-10-CM | POA: Diagnosis not present

## 2021-08-01 DIAGNOSIS — F5105 Insomnia due to other mental disorder: Secondary | ICD-10-CM | POA: Diagnosis not present

## 2021-08-01 MED ORDER — CLONAZEPAM 0.5 MG PO TABS: 0.5000 mg | ORAL_TABLET | Freq: Three times a day (TID) | ORAL | 2 refills | Status: DC | PRN

## 2021-08-01 MED ORDER — TRAZODONE HCL 50 MG PO TABS: 50.0000 mg | ORAL_TABLET | Freq: Every day | ORAL | 3 refills | Status: DC

## 2021-08-01 MED ORDER — SERTRALINE HCL 100 MG PO TABS: ORAL_TABLET | ORAL | 3 refills | Status: DC

## 2021-08-01 NOTE — Progress Notes (Signed)
Virtual Visit via Telephone Note  I connected with Cheryl Cross on 08/01/21 at 11:20 AM EDT by telephone and verified that I am speaking with the correct person using two identifiers.  Location: Patient: home Provider: home office   I discussed the limitations, risks, security and privacy concerns of performing an evaluation and management service by telephone and the availability of in person appointments. I also discussed with the patient that there may be a patient responsible charge related to this service. The patient expressed understanding and agreed to proceed.      I discussed the assessment and treatment plan with the patient. The patient was provided an opportunity to ask questions and all were answered. The patient agreed with the plan and demonstrated an understanding of the instructions.   The patient was advised to call back or seek an in-person evaluation if the symptoms worsen or if the condition fails to improve as anticipated.  I provided 20 minutes of non-face-to-face time during this encounter.   [name], MD  [facility] MD/PA/NP OP Progress Note  08/01/2021 11:47 AM Cheryl Cross  MRN:  [id number]  Chief Complaint:  Chief Complaint   Depression; Anxiety; Follow-up    HPI: This patient is a 55 year old widowed white female who lives alone in [place].  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  She states that she is having more stress.  She is trying to get approved for long-term disability.  Her father has lung cancer but is doing a little bit better right now.  She is mostly worried about her financial situation.  Fortunately she feels that her medications are helping her depression and anxiety although she still gets extremely anxious when too many things come at her at once.  She is feeling more comfortable taking the clonazepam and it  has been helpful.  She denies thoughts of self-harm or suicidal ideation.  He is sleeping fairly well. Visit Diagnosis:    ICD-10-CM   1. Major depressive disorder, single episode, moderate (HCC)  F32.1 sertraline (ZOLOFT) 100 MG tablet    2. Insomnia secondary to depression with anxiety  F51.05 traZODone (DESYREL) 50 MG tablet   F41.8     3. 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 placed on heart monitor x 24hours, everything fine    Sesamoiditis February 2017    Past Surgical History:  Procedure Laterality Date   ABDOMINAL HYSTERECTOMY     BIOPSY  01/04/2021   Procedure: BIOPSY;  Surgeon: [name], MD;  [facility];  Service: Gastroenterology;;  small bowel biopsy   COLONOSCOPY WITH PROPOFOL N/A 01/04/2021   Procedure: COLONOSCOPY WITH PROPOFOL;  Surgeon: [name], MD;  [facility]: [facility];  Service: Gastroenterology;  Laterality: N/A;  1115   ESOPHAGOGASTRODUODENOSCOPY N/A 09/10/2014   Procedure: ESOPHAGOGASTRODUODENOSCOPY (  EGD);  Surgeon: Rogene Houston, MD;  [facility]: [facility];  Service: Endoscopy;  Laterality: N/A;  130 - moved to 3:15 - Ann to notify   ESOPHAGOGASTRODUODENOSCOPY (EGD) WITH PROPOFOL N/A 01/04/2021   Procedure: ESOPHAGOGASTRODUODENOSCOPY (EGD) WITH PROPOFOL;  Surgeon: [name], MD;  [facility];  Service: Gastroenterology;  Laterality: N/A;   LUMBAR FUSION      POLYPECTOMY  01/04/2021   Procedure: POLYPECTOMY INTESTINAL;  Surgeon: [name], MD;  [facility]: [facility];  Service: Gastroenterology;;  cecal lesion; ascending colon polyp;    TOOTH EXTRACTION Left Aug 2016    Family Psychiatric History: see below  Family History:  Family History  Problem Relation Age of Onset   Cancer - Colon Mother        age 65   Anxiety disorder Mother    Diabetes Mother    Atrial fibrillation Father    Lung cancer Father    Atrial fibrillation Brother    Bipolar disorder Neg Hx    Dementia Neg Hx    Drug abuse Neg Hx    Paranoid behavior Neg Hx    Schizophrenia Neg Hx    Seizures Neg Hx    Sexual abuse Neg Hx    Physical abuse Neg Hx     Social History:  Social History   Socioeconomic History   Marital status: Widowed    Spouse name: Not on file   Number of children: 0   Years of education: BA   Highest education level: Not on file  Occupational History   Occupation: [employer]- on leave (since 08/2020)  Tobacco Use   Smoking status: Every Day    Packs/day: 1.00    Years: 15.00    Pack years: 15.00    Types: Cigarettes    Start date: 11/12/1996   Smokeless tobacco: Never   Tobacco comments:    1 pack a day x 20 yrs.  Vaping Use   Vaping Use: Never used  Substance and Sexual Activity   Alcohol use: No    Alcohol/week: 0.0 standard drinks   Drug use: No    Types: Hydrocodone, Benzodiazepines   Sexual activity: Not on file  Other Topics Concern   Not on file  Social History Narrative   Right handed   2-3 glasses soda per day   Lives alone   Social Determinants of Health   Financial Resource Strain: Not on file  Food Insecurity: Not on file  Transportation Needs: Not on file  Physical Activity: Not on file  Stress: Not on file  Social Connections: Not on file    Allergies:  Allergies  Allergen Reactions   Gabapentin Hives   Hydrocodone-Acetaminophen Nausea Only   Penicillins Other  (See Comments)    Unknown child hood reaction .Did it involve swelling of the face/tongue/throat, SOB, or low BP? Unknown Did it involve sudden or severe rash/hives, skin peeling, or any reaction on the inside of your mouth or nose? Unknown Did you need to seek medical attention at a hospital or doctor's office? Unknown When did it last happen?       If all above answers are "NO", may proceed with cephalosporin use.     Metabolic Disorder Labs: No results found for: HGBA1C, MPG No results found for: PROLACTIN No results found for: CHOL, TRIG, HDL, CHOLHDL, VLDL, LDLCALC Lab Results  Component Value Date   TSH 1.770 10/13/2020   TSH 1.490 05/28/2016    Therapeutic  Level Labs: No results found for: LITHIUM No results found for: VALPROATE No components found for:  CBMZ  Current Medications: Current Outpatient Medications  Medication Sig Dispense Refill   acetaminophen (TYLENOL) 325 MG tablet Take 2 tablets by mouth every 4 (four) hours as needed.     aspirin 81 MG tablet Take 1 tablet (81 mg total) by mouth daily. For Polycythemia Vera.     clonazePAM (KLONOPIN) 0.5 MG tablet Take 1 tablet (0.5 mg total) by mouth 3 (three) times daily as needed for anxiety. 90 tablet 2   dicyclomine (BENTYL) 10 MG capsule Take 1 capsule (10 mg total) by mouth every 12 (twelve) hours as needed (abdominal pain). 60 capsule 2   sertraline (ZOLOFT) 100 MG tablet TAKE 1 AND 1/2 TABLETS(150 MG) BY MOUTH DAILY 45 tablet 3   traMADol (ULTRAM) 50 MG tablet Take 0.5 tablets (25 mg total) by mouth every 12 (twelve) hours as needed for moderate pain (has polycythemia, reports severe arthralgias intermittently). 30 tablet 0   traZODone (DESYREL) 50 MG tablet Take 1 tablet (50 mg total) by mouth at bedtime. 30 tablet 3   vitamin B-12 (CYANOCOBALAMIN) 500 MCG tablet Take 500 mcg by mouth daily with lunch.     No current facility-administered medications for this visit.     Musculoskeletal: Strength & Muscle  Tone: na Gait & Station: na Patient leans: N/A  Psychiatric Specialty Exam: Review of Systems  Eyes:  Positive for visual disturbance.  Psychiatric/Behavioral:  The patient is nervous/anxious.   All other systems reviewed and are negative.  There were no vitals taken for this visit.There is no height or weight on file to calculate BMI.  General Appearance: NA  Eye Contact:  NA  Speech:  Clear and Coherent  Volume:  Normal  Mood:  Anxious and Euthymic  Affect:  NA  Thought Process:  Goal Directed  Orientation:  Full (Time, Place, and Person)  Thought Content: Rumination   Suicidal Thoughts:  No  Homicidal Thoughts:  No  Memory:  Immediate;   Good Recent;   Good Remote;   Good  Judgement:  Good  Insight:  Good  Psychomotor Activity:  Decreased  Concentration:  Concentration: Fair and Attention Span: Fair  Recall:  Good  Fund of Knowledge: Good  Language: Good  Akathisia:  No  Handed:  Right  AIMS (if indicated): not done  Assets:  Communication Skills Desire for Improvement Resilience Social Support Talents/Skills  ADL's:  Intact  Cognition: WNL  Sleep:  Good   Screenings: GAD-7    Flowsheet Row Counselor from 01/13/2021 in [facility]  Total GAD-7 Score 17      PHQ2-9    Flowsheet Row Video Visit from 08/01/2021 in [facility] Video Visit from 06/21/2021 in [facility] Video Visit from 04/26/2021 in [place] [facility] Video Visit from 03/14/2021 in [facility] [facility] Video Visit from 02/06/2021 in [facility]  PHQ-2 Total Score 1 1 1 3 2   PHQ-9 Total Score -- -- -- 4 3      Flowsheet Row Video Visit from 08/01/2021 in [facility] from 07/01/2021 in [place]  Video Visit from 06/21/2021 in [place] No Risk No Risk No Risk        Assessment and Plan: This patient is a 55 year old female with a history of substance  abuse in remission, depression insomnia and severe visual impairment.  She still has a good deal of worry and anxiety particularly about finances.  She declines counseling because she is worried about the cost.  She is she is using the clonazepam 0.5 mg 3 times daily as needed a little bit more often which has been helpful.  She will continue this as well as trazodone 50 mg at bedtime which is helping her sleep and Zoloft 150 mg daily for depression.  She is still not able to work given her significant visual impairments as well as the depression and anxiety which worsen when she becomes overwhelmed.  She will return to see me in 2 months   [name], MD 08/01/2021, 11:47 AM

## 2021-08-17 ENCOUNTER — Encounter: Payer: Self-pay | Admitting: Podiatry

## 2021-08-17 ENCOUNTER — Telehealth (HOSPITAL_COMMUNITY): Payer: Self-pay | Admitting: *Deleted

## 2021-08-17 ENCOUNTER — Other Ambulatory Visit: Payer: Self-pay

## 2021-08-17 ENCOUNTER — Ambulatory Visit: Payer: BC Managed Care – PPO | Admitting: Podiatry

## 2021-08-17 DIAGNOSIS — L600 Ingrowing nail: Secondary | ICD-10-CM

## 2021-08-17 DIAGNOSIS — M778 Other enthesopathies, not elsewhere classified: Secondary | ICD-10-CM | POA: Diagnosis not present

## 2021-08-17 MED ORDER — TRAMADOL HCL 50 MG PO TABS: 50.0000 mg | ORAL_TABLET | Freq: Four times a day (QID) | ORAL | 0 refills | Status: AC | PRN

## 2021-08-17 NOTE — Patient Instructions (Signed)

## 2021-08-17 NOTE — Telephone Encounter (Signed)
Called patient and was not able to reach her. Staff wanted to inform patient that her completed form she dropped off was ready for pick up. Form will be placed in brown FMLA/Misc Paperwork folder. Office number provided on voicemail.

## 2021-08-20 NOTE — Progress Notes (Signed)
Subjective:   Patient ID: Cheryl Cross, female   DOB: 55 y.o.   MRN: [id number]   HPI Patient presents stating that her left nails are really bothering her and that she needs them removed and also the joint is bothering her stating the right is doing better but she knows she needs orthotics long-term to try to give her better support of her feet   ROS      Objective:  Physical Exam  Neurovascular status intact with right nailbeds healing well wound edges well coapted no drainage and incurvated thickened nailbeds 1 through 5 left foot that are painful when pressed along with inflammation of the second MPJ with improvement of the right second MPJ upon palpation     Assessment:  Chronic nail disease with incurvation 1-3 4 and 5 of the left foot with well-healing site right and capsulitis second MPJ left over right     Plan:  H&P reviewed all conditions recommended nail removal left allowed her to read and signed consent form after review understanding risk and today I went ahead and infiltrated the left forefoot with 300 mg Xylocaine Marcaine mixture and sterile prep done to each digit individually and using sterile technique I removed the nailbeds 1-3 4 and 5 for the left foot exposed matrix and applied chemical agent phenol 5 applications to the hallux for applications to remaining nailbeds and then applied alcohol and sterile dressings and did do sterile prep and injected the second MPJ 2 mg dexamethasone Kenalog and discussed long-term orthotics which we will do once we have healed the nailbeds.  Reappoint 1 month to reevaluate earlier if needed

## 2021-09-05 ENCOUNTER — Ambulatory Visit (HOSPITAL_COMMUNITY)
Admission: RE | Admit: 2021-09-05 | Discharge: 2021-09-05 | Disposition: A | Discharge status: Home / Self Care | Payer: BC Managed Care – PPO | Source: Ambulatory Visit | Attending: Physician Assistant | Admitting: Physician Assistant

## 2021-09-05 ENCOUNTER — Other Ambulatory Visit (HOSPITAL_COMMUNITY): Payer: Self-pay | Admitting: Physician Assistant

## 2021-09-05 ENCOUNTER — Other Ambulatory Visit: Payer: Self-pay

## 2021-09-05 DIAGNOSIS — R071 Chest pain on breathing: Secondary | ICD-10-CM

## 2021-09-14 ENCOUNTER — Ambulatory Visit: Payer: BC Managed Care – PPO | Admitting: Podiatry

## 2021-09-14 ENCOUNTER — Other Ambulatory Visit: Payer: Self-pay

## 2021-09-14 DIAGNOSIS — M778 Other enthesopathies, not elsewhere classified: Secondary | ICD-10-CM

## 2021-09-14 DIAGNOSIS — L600 Ingrowing nail: Secondary | ICD-10-CM

## 2021-09-15 NOTE — Progress Notes (Signed)
Subjective:   Patient ID: Cheryl Cross, female   DOB: 55 y.o.   MRN: [id number]   HPI Patient presents stating my nails are healing pretty well but I am getting a lot of pain still around the joints of both feet and I need something to take weight off of them as the injections helped to a degree but not totally   ROS      Objective:  Physical Exam  Neurovascular status intact with nails healing well 1 through 5 both feet secondary to previous removal with patient found to have inflammation pain still noted around the second metatarsal phalangeal joint bilateral     Assessment:  Inflammatory capsulitis of the second MPJ bilateral still present along with healing nail site bilateral     Plan:  H&P reviewed all conditions and at this point recommended orthotics to offload weight off the second MPJ and I did mark the second MPJ and I went ahead today and I casted for functional orthotic devices and patient will be seen back when they are ready.  Patient will continue soaks of the left nails but overall is healing very well from the surgery

## 2021-09-20 ENCOUNTER — Telehealth (INDEPENDENT_AMBULATORY_CARE_PROVIDER_SITE_OTHER): Payer: BC Managed Care – PPO | Admitting: Psychiatry

## 2021-09-20 ENCOUNTER — Telehealth (HOSPITAL_COMMUNITY): Payer: BC Managed Care – PPO | Admitting: Psychiatry

## 2021-09-20 ENCOUNTER — Encounter (HOSPITAL_COMMUNITY): Payer: Self-pay | Admitting: Psychiatry

## 2021-09-20 ENCOUNTER — Other Ambulatory Visit: Payer: Self-pay

## 2021-09-20 DIAGNOSIS — F418 Other specified anxiety disorders: Secondary | ICD-10-CM

## 2021-09-20 DIAGNOSIS — F321 Major depressive disorder, single episode, moderate: Secondary | ICD-10-CM

## 2021-09-20 DIAGNOSIS — F5105 Insomnia due to other mental disorder: Secondary | ICD-10-CM | POA: Diagnosis not present

## 2021-09-20 DIAGNOSIS — F429 Obsessive-compulsive disorder, unspecified: Secondary | ICD-10-CM

## 2021-09-20 MED ORDER — SERTRALINE HCL 100 MG PO TABS: ORAL_TABLET | ORAL | 3 refills | Status: DC

## 2021-09-20 MED ORDER — TRAZODONE HCL 50 MG PO TABS: 50.0000 mg | ORAL_TABLET | Freq: Every day | ORAL | 3 refills | Status: DC

## 2021-09-20 MED ORDER — CLONAZEPAM 0.5 MG PO TABS: 0.5000 mg | ORAL_TABLET | Freq: Three times a day (TID) | ORAL | 2 refills | Status: DC | PRN

## 2021-09-20 NOTE — Progress Notes (Signed)
Virtual Visit via Telephone Note  I connected with Cheryl Cross on 09/20/21 at 10:00 AM EST by telephone and verified that I am speaking with the correct person using two identifiers.  Location: Patient: home Provider: office   I discussed the limitations, risks, security and privacy concerns of performing an evaluation and management service by telephone and the availability of in person appointments. I also discussed with the patient that there may be a patient responsible charge related to this service. The patient expressed understanding and agreed to proceed.      I discussed the assessment and treatment plan with the patient. The patient was provided an opportunity to ask questions and all were answered. The patient agreed with the plan and demonstrated an understanding of the instructions.   The patient was advised to call back or seek an in-person evaluation if the symptoms worsen or if the condition fails to improve as anticipated.  I provided 15 minutes of non-face-to-face time during this encounter.   Cheryl Spiller, MD  [facility] MD/PA/NP OP Progress Note  09/20/2021 10:13 AM JEWEL MCAFEE  MRN:  [id number]  Chief Complaint:  Chief Complaint   Anxiety; Depression; Follow-up    HPI: This patient is a 55 year old widowed white female who lives alone in [place].  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  The patient returns for follow-up after 6 weeks.  She seems to be a little less stressed.  She has applied for Social Security disability and is now in progress.  She is also applied for long-term disability from the school system.  She thinks she will feel a lot better when she is financially stable.  Her vision continues to slowly worsen which is definitely concern.  Her father's lung cancer is still a big concern as well.  He has had some reactions to medications.  She has been taking the clonazepam  once or twice a day and its really helped.  She denies significant depression and she is sleeping well. Visit Diagnosis:    ICD-10-CM   1. Insomnia secondary to depression with anxiety  F51.05 traZODone (DESYREL) 50 MG tablet   F41.8     2. Major depressive disorder, single episode, moderate (HCC)  F32.1 sertraline (ZOLOFT) 100 MG tablet    3. 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 placed on heart monitor x 24hours, everything fine    Sesamoiditis February 2017    Past Surgical History:  Procedure Laterality Date   ABDOMINAL HYSTERECTOMY     BIOPSY  01/04/2021   Procedure: BIOPSY;  Surgeon: [name], MD;  [facility];  Service: Gastroenterology;;  small bowel biopsy   COLONOSCOPY WITH PROPOFOL N/A 01/04/2021   Procedure: COLONOSCOPY WITH PROPOFOL;  Surgeon: [name], MD;  [facility];  Service: Gastroenterology;  Laterality: N/A;  1115   ESOPHAGOGASTRODUODENOSCOPY N/A 09/10/2014  Procedure: ESOPHAGOGASTRODUODENOSCOPY (EGD);  Surgeon: Rogene Houston, MD;  [facility];  Service: Endoscopy;  Laterality: N/A;  130 - moved to 3:15 - Ann to notify   ESOPHAGOGASTRODUODENOSCOPY (EGD) WITH PROPOFOL N/A 01/04/2021   Procedure: ESOPHAGOGASTRODUODENOSCOPY (EGD) WITH PROPOFOL;  Surgeon: [name], MD;  [facility];  Service: Gastroenterology;  Laterality: N/A;   LUMBAR FUSION      POLYPECTOMY  01/04/2021   Procedure: POLYPECTOMY INTESTINAL;  Surgeon: [name], MD;  [facility];  Service: Gastroenterology;;  cecal lesion; ascending colon polyp;    TOOTH EXTRACTION Left Aug 2016    Family Psychiatric History: see below  Family History:  Family History  Problem Relation Age of Onset   Cancer - Colon Mother        age 59   Anxiety disorder Mother    Diabetes Mother    Atrial fibrillation Father    Lung cancer Father    Atrial fibrillation Brother    Bipolar disorder Neg Hx    Dementia Neg Hx    Drug abuse Neg Hx    Paranoid behavior Neg Hx    Schizophrenia Neg Hx    Seizures Neg Hx    Sexual abuse Neg Hx    Physical abuse Neg Hx     Social History:  Social History   Socioeconomic History   Marital status: Widowed    Spouse name: Not on file   Number of children: 0   Years of education: BA   Highest education level: Not on file  Occupational History   Occupation: [employer]- on leave (since 08/2020)  Tobacco Use   Smoking status: Every Day    Packs/day: 1.00    Years: 15.00    Pack years: 15.00    Types: Cigarettes    Start date: 11/12/1996   Smokeless tobacco: Never   Tobacco comments:    1 pack a day x 20 yrs.  Vaping Use   Vaping Use: Never used  Substance and Sexual Activity   Alcohol use: No    Alcohol/week: 0.0 standard drinks   Drug use: No    Types: Hydrocodone, Benzodiazepines   Sexual activity: Not on file  Other Topics Concern   Not on file  Social History Narrative   Right handed   2-3 glasses soda per day   Lives alone   Social Determinants of Health   Financial Resource Strain: Not on file  Food Insecurity: Not on file  Transportation Needs: Not on file  Physical Activity: Not on file  Stress: Not on file  Social Connections: Not on file    Allergies:  Allergies  Allergen Reactions   Gabapentin Hives   Hydrocodone-Acetaminophen Nausea Only   Penicillins Other  (See Comments)    Unknown child hood reaction .Did it involve swelling of the face/tongue/throat, SOB, or low BP? Unknown Did it involve sudden or severe rash/hives, skin peeling, or any reaction on the inside of your mouth or nose? Unknown Did you need to seek medical attention at a hospital or doctor's office? Unknown When did it last happen?       If all above answers are "NO", may proceed with cephalosporin use.     Metabolic Disorder Labs: No results found for: HGBA1C, MPG No results found for: PROLACTIN No results found for: CHOL, TRIG, HDL, CHOLHDL, VLDL, LDLCALC Lab Results  Component Value Date   TSH 1.770 10/13/2020   TSH 1.490 05/28/2016  Therapeutic Level Labs: No results found for: LITHIUM No results found for: VALPROATE No components found for:  CBMZ  Current Medications: Current Outpatient Medications  Medication Sig Dispense Refill   acetaminophen (TYLENOL) 325 MG tablet Take 2 tablets by mouth every 4 (four) hours as needed.     aspirin 81 MG tablet Take 1 tablet (81 mg total) by mouth daily. For Polycythemia Vera.     clonazePAM (KLONOPIN) 0.5 MG tablet Take 1 tablet (0.5 mg total) by mouth 3 (three) times daily as needed for anxiety. 90 tablet 2   dicyclomine (BENTYL) 10 MG capsule Take 1 capsule (10 mg total) by mouth every 12 (twelve) hours as needed (abdominal pain). 60 capsule 2   sertraline (ZOLOFT) 100 MG tablet TAKE 1 AND 1/2 TABLETS(150 MG) BY MOUTH DAILY 45 tablet 3   traMADol (ULTRAM) 50 MG tablet Take 0.5 tablets (25 mg total) by mouth every 12 (twelve) hours as needed for moderate pain (has polycythemia, reports severe arthralgias intermittently). 30 tablet 0   traZODone (DESYREL) 50 MG tablet Take 1 tablet (50 mg total) by mouth at bedtime. 30 tablet 3   vitamin B-12 (CYANOCOBALAMIN) 500 MCG tablet Take 500 mcg by mouth daily with lunch.     No current facility-administered medications for this visit.     Musculoskeletal: Strength & Muscle  Tone: na Gait & Station: na Patient leans: N/A  Psychiatric Specialty Exam: Review of Systems  Eyes:  Positive for visual disturbance.  Psychiatric/Behavioral:  The patient is nervous/anxious.   All other systems reviewed and are negative.  There were no vitals taken for this visit.There is no height or weight on file to calculate BMI.  General Appearance: NA  Eye Contact:  NA  Speech:  Clear and Coherent  Volume:  Normal  Mood:  Anxious and Euthymic  Affect:  NA  Thought Process:  Goal Directed  Orientation:  Full (Time, Place, and Person)  Thought Content: Rumination   Suicidal Thoughts:  No  Homicidal Thoughts:  No  Memory:  Immediate;   Good Recent;   Good Remote;   Good  Judgement:  Good  Insight:  Good  Psychomotor Activity:  Decreased  Concentration:  Concentration: Good and Attention Span: Good  Recall:  Good  Fund of Knowledge: Good  Language: Good  Akathisia:  No  Handed:  Right  AIMS (if indicated): not done  Assets:  Communication Skills Desire for Improvement Resilience Social Support Talents/Skills  ADL's:  Intact  Cognition: WNL  Sleep:  Good   Screenings: GAD-7    Flowsheet Row Counselor from 01/13/2021 in [facility] [facility]  Total GAD-7 Score 17      PHQ2-9    Flowsheet Row Video Visit from 09/20/2021 in [facility] [facility] [facility] Video Visit from 08/01/2021 in [facility] [facility] Video Visit from 06/21/2021 in [place] [facility] [place] Video Visit from 04/26/2021 in [facility] Video Visit from 03/14/2021 in [facility] [facility]  PHQ-2 Total Score 0 1 1 1 3   PHQ-9 Total Score -- -- -- -- 4      Flowsheet Row Video Visit from 09/20/2021 in [facility] [place] [facility] Video Visit from 08/01/2021 in [place] [facility] from 07/01/2021 in [place] No Risk No Risk No Risk        Assessment and Plan: This patient is a 55 year old female with a history of  substance abuse in remission depression insomnia and severe visual impairment.  The clonazepam is really helping her anxiety now that she is taking it more willingly.  She will continue clonazepam 0.5 mg 3 times daily as needed for anxiety.  She will continue trazodone 50 mg which is helping her sleep and Zoloft 150 mg for depression.  She will return to see me in 32-months   Cheryl Spiller, MD 09/20/2021, 10:13 AM

## 2021-09-25 ENCOUNTER — Other Ambulatory Visit (HOSPITAL_COMMUNITY): Payer: BC Managed Care – PPO

## 2021-10-09 ENCOUNTER — Inpatient Hospital Stay (HOSPITAL_COMMUNITY): Payer: BC Managed Care – PPO

## 2021-10-09 ENCOUNTER — Telehealth: Payer: Self-pay | Admitting: Podiatry

## 2021-10-09 NOTE — Telephone Encounter (Signed)
Orthotics in.. lvm for pt to call to schedule an appt to pick them up. °

## 2021-10-18 ENCOUNTER — Other Ambulatory Visit: Payer: Self-pay

## 2021-10-18 ENCOUNTER — Inpatient Hospital Stay (HOSPITAL_COMMUNITY): Payer: BC Managed Care – PPO | Attending: Hematology

## 2021-10-18 DIAGNOSIS — D45 Polycythemia vera: Secondary | ICD-10-CM | POA: Insufficient documentation

## 2021-10-18 LAB — CBC WITH DIFFERENTIAL/PLATELET
Abs Immature Granulocytes: 0.04 10*3/uL (ref 0.00–0.07)
Basophils Absolute: 0 10*3/uL (ref 0.0–0.1)
Basophils Relative: 0 %
Eosinophils Absolute: 0.1 10*3/uL (ref 0.0–0.5)
Eosinophils Relative: 2 %
HCT: 38.1 % (ref 36.0–46.0)
Hemoglobin: 12.6 g/dL (ref 12.0–15.0)
Immature Granulocytes: 1 %
Lymphocytes Relative: 24 %
Lymphs Abs: 2 10*3/uL (ref 0.7–4.0)
MCH: 30.7 pg (ref 26.0–34.0)
MCHC: 33.1 g/dL (ref 30.0–36.0)
MCV: 92.9 fL (ref 80.0–100.0)
Monocytes Absolute: 0.6 10*3/uL (ref 0.1–1.0)
Monocytes Relative: 7 %
Neutro Abs: 5.5 10*3/uL (ref 1.7–7.7)
Neutrophils Relative %: 66 %
Platelets: 295 10*3/uL (ref 150–400)
RBC: 4.1 MIL/uL (ref 3.87–5.11)
RDW: 13.9 % (ref 11.5–15.5)
WBC: 8.2 10*3/uL (ref 4.0–10.5)
nRBC: 0 % (ref 0.0–0.2)

## 2021-10-18 LAB — COMPREHENSIVE METABOLIC PANEL
ALT: 13 U/L (ref 0–44)
AST: 15 U/L (ref 15–41)
Albumin: 3.8 g/dL (ref 3.5–5.0)
Alkaline Phosphatase: 81 U/L (ref 38–126)
Anion gap: 9 (ref 5–15)
BUN: 5 mg/dL — ABNORMAL LOW (ref 6–20)
CO2: 27 mmol/L (ref 22–32)
Calcium: 8.7 mg/dL — ABNORMAL LOW (ref 8.9–10.3)
Chloride: 102 mmol/L (ref 98–111)
Creatinine, Ser: 0.72 mg/dL (ref 0.44–1.00)
GFR, Estimated: >60 mL/min (ref >60)
Glucose, Bld: 96 mg/dL (ref 70–99)
Potassium: 3 mmol/L — ABNORMAL LOW (ref 3.5–5.1)
Sodium: 138 mmol/L (ref 135–145)
Total Bilirubin: 0.3 mg/dL (ref 0.3–1.2)
Total Protein: 6.7 g/dL (ref 6.5–8.1)

## 2021-10-18 LAB — LACTATE DEHYDROGENASE: LDH: 108 U/L (ref 98–192)

## 2021-10-23 ENCOUNTER — Ambulatory Visit: Payer: BC Managed Care – PPO

## 2021-10-23 ENCOUNTER — Other Ambulatory Visit: Payer: Self-pay

## 2021-10-23 ENCOUNTER — Other Ambulatory Visit: Payer: Self-pay | Admitting: Podiatry

## 2021-10-23 DIAGNOSIS — D45 Polycythemia vera: Secondary | ICD-10-CM

## 2021-10-23 DIAGNOSIS — M778 Other enthesopathies, not elsewhere classified: Secondary | ICD-10-CM

## 2021-10-23 NOTE — Progress Notes (Signed)
SITUATION: Reason for Visit: Fitting and Delivery of Custom Fabricated Foot Orthoses Patient Report: Patient reports comfort and is satisfied with device.  OBJECTIVE DATA: Patient History / Diagnosis:  No change in pathology Provided Device:  Custom functional foot orthoses  GOAL OF ORTHOSIS - Improve gait - Decrease energy expenditure - Improve Balance - Provide Triplanar stability of foot complex - Facilitate motion  ACTIONS PERFORMED Patient was fit with foot orthoses trimmed to shoe last. Patient tolerated fittign procedure. Device was modified as follows to better fit patient: - Toe plate was trimmed to shoe last  Patient was provided with verbal and written instruction and demonstration regarding donning, doffing, wear, care, proper fit, function, purpose, cleaning, and use of the orthosis and in all related precautions and risks and benefits regarding the orthosis.  Patient was also provided with verbal instruction regarding how to report any failures or malfunctions of the orthosis and necessary follow up care. Patient was also instructed to contact our office regarding any change in status that may affect the function of the orthosis.  Patient demonstrated independence with proper donning, doffing, and fit and verbalized understanding of all instructions.  PLAN: Patient is to follow up in one week or as necessary (PRN). All questions were answered and concerns addressed. Plan of care was discussed with and agreed upon by the patient.

## 2021-10-24 NOTE — Telephone Encounter (Signed)
Please advise 

## 2021-10-25 ENCOUNTER — Other Ambulatory Visit: Payer: Self-pay | Admitting: Podiatry

## 2021-10-25 MED ORDER — TRAMADOL HCL 50 MG PO TABS: 50.0000 mg | ORAL_TABLET | Freq: Three times a day (TID) | ORAL | 0 refills | Status: AC | PRN

## 2021-10-25 NOTE — Telephone Encounter (Signed)
I wrote her for another week of one bid

## 2021-11-20 ENCOUNTER — Other Ambulatory Visit: Payer: Self-pay

## 2021-11-20 ENCOUNTER — Encounter: Payer: Self-pay | Admitting: Hematology and Oncology

## 2021-11-20 ENCOUNTER — Other Ambulatory Visit (HOSPITAL_COMMUNITY): Payer: Self-pay

## 2021-11-20 ENCOUNTER — Inpatient Hospital Stay (HOSPITAL_COMMUNITY): Payer: BC Managed Care – PPO | Attending: Hematology

## 2021-11-20 DIAGNOSIS — D45 Polycythemia vera: Secondary | ICD-10-CM | POA: Insufficient documentation

## 2021-11-20 LAB — COMPREHENSIVE METABOLIC PANEL
ALT: 11 U/L (ref 0–44)
AST: 12 U/L — ABNORMAL LOW (ref 15–41)
Albumin: 4 g/dL (ref 3.5–5.0)
Alkaline Phosphatase: 69 U/L (ref 38–126)
Anion gap: 7 (ref 5–15)
BUN: 6 mg/dL (ref 6–20)
CO2: 27 mmol/L (ref 22–32)
Calcium: 8.6 mg/dL — ABNORMAL LOW (ref 8.9–10.3)
Chloride: 104 mmol/L (ref 98–111)
Creatinine, Ser: 0.71 mg/dL (ref 0.44–1.00)
GFR, Estimated: >60 mL/min (ref >60)
Glucose, Bld: 93 mg/dL (ref 70–99)
Potassium: 3.6 mmol/L (ref 3.5–5.1)
Sodium: 138 mmol/L (ref 135–145)
Total Bilirubin: 0.2 mg/dL — ABNORMAL LOW (ref 0.3–1.2)
Total Protein: 6.6 g/dL (ref 6.5–8.1)

## 2021-11-20 LAB — CBC WITH DIFFERENTIAL/PLATELET
Abs Immature Granulocytes: 0.01 10*3/uL (ref 0.00–0.07)
Basophils Absolute: 0 10*3/uL (ref 0.0–0.1)
Basophils Relative: 0 %
Eosinophils Absolute: 0.1 10*3/uL (ref 0.0–0.5)
Eosinophils Relative: 1 %
HCT: 41 % (ref 36.0–46.0)
Hemoglobin: 12.9 g/dL (ref 12.0–15.0)
Immature Granulocytes: 0 %
Lymphocytes Relative: 36 %
Lymphs Abs: 1.9 10*3/uL (ref 0.7–4.0)
MCH: 29.3 pg (ref 26.0–34.0)
MCHC: 31.5 g/dL (ref 30.0–36.0)
MCV: 93.2 fL (ref 80.0–100.0)
Monocytes Absolute: 0.3 10*3/uL (ref 0.1–1.0)
Monocytes Relative: 6 %
Neutro Abs: 3 10*3/uL (ref 1.7–7.7)
Neutrophils Relative %: 57 %
Platelets: 213 10*3/uL (ref 150–400)
RBC: 4.4 MIL/uL (ref 3.87–5.11)
RDW: 14.8 % (ref 11.5–15.5)
WBC: 5.3 10*3/uL (ref 4.0–10.5)
nRBC: 0 % (ref 0.0–0.2)

## 2021-11-20 LAB — LACTATE DEHYDROGENASE: LDH: 94 U/L — ABNORMAL LOW (ref 98–192)

## 2021-11-21 ENCOUNTER — Telehealth (INDEPENDENT_AMBULATORY_CARE_PROVIDER_SITE_OTHER): Payer: BC Managed Care – PPO | Admitting: Psychiatry

## 2021-11-21 ENCOUNTER — Encounter (HOSPITAL_COMMUNITY): Payer: Self-pay | Admitting: Psychiatry

## 2021-11-21 DIAGNOSIS — F418 Other specified anxiety disorders: Secondary | ICD-10-CM | POA: Diagnosis not present

## 2021-11-21 DIAGNOSIS — F5105 Insomnia due to other mental disorder: Secondary | ICD-10-CM

## 2021-11-21 DIAGNOSIS — F429 Obsessive-compulsive disorder, unspecified: Secondary | ICD-10-CM

## 2021-11-21 DIAGNOSIS — F321 Major depressive disorder, single episode, moderate: Secondary | ICD-10-CM

## 2021-11-21 MED ORDER — TRAZODONE HCL 50 MG PO TABS: 50.0000 mg | ORAL_TABLET | Freq: Every day | ORAL | 3 refills | Status: DC

## 2021-11-21 MED ORDER — CLONAZEPAM 0.5 MG PO TABS: 0.5000 mg | ORAL_TABLET | Freq: Three times a day (TID) | ORAL | 2 refills | Status: DC | PRN

## 2021-11-21 MED ORDER — SERTRALINE HCL 100 MG PO TABS: ORAL_TABLET | ORAL | 3 refills | Status: DC

## 2021-11-21 NOTE — Progress Notes (Signed)
Virtual Visit via Video Note  I connected with Cheryl Cross on 11/21/21 at 11:00 AM EST by a video enabled telemedicine application and verified that I am speaking with the correct person using two identifiers.  Location: Patient: home Provider: office   I discussed the limitations of evaluation and management by telemedicine and the availability of in person appointments. The patient expressed understanding and agreed to proceed.     I discussed the assessment and treatment plan with the patient. The patient was provided an opportunity to ask questions and all were answered. The patient agreed with the plan and demonstrated an understanding of the instructions.   The patient was advised to call back or seek an in-person evaluation if the symptoms worsen or if the condition fails to improve as anticipated.  I provided 25 minutes of non-face-to-face time during this encounter.   [name], MD  [facility] MD/PA/NP OP Progress Note  11/21/2021 11:27 AM Cheryl Cross  MRN:  [id number]  Chief Complaint:  Chief Complaint   Anxiety; Depression; Follow-up    HPI: This patient is a 56 year old widowed white female who lives alone in [place].  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  The patient returns for follow-up after about 2 months.  She states the month of December was very hard for her.  It was the second anniversary of her husband's death which was difficult and her niece was expecting a baby.  The niece that had complications with the first baby's delivery.  She was having a lot of anxiety and was taking the clonazepam 3 times daily every day which did help to some degree.  She states that she is doing better now and thinks the dose is still effective.  She denies significant depression but does have a lot of stress.  On the positive side she was approved for long-term disability from the state.  Her vision  continues to worsen but when she gets her finances in order she is going to try to work with either an ophthalmologist or services for the blind to try to maximize her vision and problems with glare.  She is sleeping well and trying to eat a little bit better.  She denies thoughts of self-harm or suicidal ideation Visit Diagnosis:    ICD-10-CM   1. Major depressive disorder, single episode, moderate (HCC)  F32.1 sertraline (ZOLOFT) 100 MG tablet    2. Obsessive-compulsive disorder, unspecified type  F42.9 sertraline (ZOLOFT) 100 MG tablet    3. Insomnia secondary to depression with anxiety  F51.05 traZODone (DESYREL) 50 MG tablet   F41.8       Past Psychiatric History: Psychiatric hospitalization in 2013  Past Medical History:  Past Medical History:  Diagnosis Date   Abdominal pain, right upper quadrant 06/10/2018   Anxiety    Arthritis    Bursitis    DDD (degenerative disc disease)    Depression    Fibromyalgia    Obsessive-compulsive disorder    Overactive bladder    Polycythemia vera(238.4) January 2013   PTSD (post-traumatic stress disorder)    PVC's (premature ventricular contractions)    pt. placed on heart monitor x 24hours, everything fine    Sesamoiditis February 2017    Past Surgical History:  Procedure Laterality Date   ABDOMINAL HYSTERECTOMY     BIOPSY  01/04/2021   Procedure: BIOPSY;  Surgeon: [name], MD;  [facility];  Service: Gastroenterology;;  small bowel biopsy   COLONOSCOPY WITH PROPOFOL N/A 01/04/2021   Procedure: COLONOSCOPY WITH PROPOFOL;  Surgeon: [name], MD;  [facility];  Service: Gastroenterology;  Laterality: N/A;  1115   ESOPHAGOGASTRODUODENOSCOPY N/A 09/10/2014   Procedure: ESOPHAGOGASTRODUODENOSCOPY (EGD);  Surgeon: Rogene Houston, MD;  [facility];  Service: Endoscopy;  Laterality: N/A;  130 - moved to 3:15 - Ann to notify   ESOPHAGOGASTRODUODENOSCOPY (EGD) WITH PROPOFOL  N/A 01/04/2021   Procedure: ESOPHAGOGASTRODUODENOSCOPY (EGD) WITH PROPOFOL;  Surgeon: [name], MD;  [facility];  Service: Gastroenterology;  Laterality: N/A;   LUMBAR FUSION     POLYPECTOMY  01/04/2021   Procedure: POLYPECTOMY INTESTINAL;  Surgeon: [name], MD;  [facility];  Service: Gastroenterology;;  cecal lesion; ascending colon polyp;    TOOTH EXTRACTION Left Aug 2016    Family Psychiatric History: see below  Family History:  Family History  Problem Relation Age of Onset   Cancer - Colon Mother        age 67   Anxiety disorder Mother    Diabetes Mother    Atrial fibrillation Father    Lung cancer Father    Atrial fibrillation Brother    Bipolar disorder Neg Hx    Dementia Neg Hx    Drug abuse Neg Hx    Paranoid behavior Neg Hx    Schizophrenia Neg Hx    Seizures Neg Hx    Sexual abuse Neg Hx    Physical abuse Neg Hx     Social History:  Social History   Socioeconomic History   Marital status: Widowed    Spouse name: Not on file   Number of children: 0   Years of education: BA   Highest education level: Not on file  Occupational History   Occupation: [employer]- on leave (since 08/2020)  Tobacco Use   Smoking status: Every Day    Packs/day: 1.00    Years: 15.00    Pack years: 15.00    Types: Cigarettes    Start date: 11/12/1996   Smokeless tobacco: Never   Tobacco comments:    1 pack a day x 20 yrs.  Vaping Use   Vaping Use: Never used  Substance and Sexual Activity   Alcohol use: No    Alcohol/week: 0.0 standard drinks   Drug use: No    Types: Hydrocodone, Benzodiazepines   Sexual activity: Not on file  Other Topics Concern   Not on file  Social History Narrative   Right handed   2-3 glasses soda per day   Lives alone   Social Determinants of Health   Financial Resource Strain: Not on file  Food Insecurity: Not on file  Transportation Needs: Not on file  Physical  Activity: Not on file  Stress: Not on file  Social Connections: Not on file    Allergies:  Allergies  Allergen Reactions   Gabapentin Hives   Hydrocodone-Acetaminophen Nausea Only   Penicillins Other (See Comments)    Unknown child hood reaction .Did it involve swelling of the face/tongue/throat, SOB, or low BP? Unknown Did it involve sudden or severe rash/hives, skin peeling, or any reaction on the inside of your mouth or nose? Unknown Did you need to seek medical attention at a hospital or doctor's office? Unknown When did it last happen?       If all above answers are NO, may proceed with cephalosporin use.  Metabolic Disorder Labs: No results found for: HGBA1C, MPG No results found for: PROLACTIN No results found for: CHOL, TRIG, HDL, CHOLHDL, VLDL, LDLCALC Lab Results  Component Value Date   TSH 1.770 10/13/2020   TSH 1.490 05/28/2016    Therapeutic Level Labs: No results found for: LITHIUM No results found for: VALPROATE No components found for:  CBMZ  Current Medications: Current Outpatient Medications  Medication Sig Dispense Refill   acetaminophen (TYLENOL) 325 MG tablet Take 2 tablets by mouth every 4 (four) hours as needed.     aspirin 81 MG tablet Take 1 tablet (81 mg total) by mouth daily. For Polycythemia Vera.     clonazePAM (KLONOPIN) 0.5 MG tablet Take 1 tablet (0.5 mg total) by mouth 3 (three) times daily as needed for anxiety. 90 tablet 2   dicyclomine (BENTYL) 10 MG capsule Take 1 capsule (10 mg total) by mouth every 12 (twelve) hours as needed (abdominal pain). 60 capsule 2   sertraline (ZOLOFT) 100 MG tablet TAKE 1 AND 1/2 TABLETS(150 MG) BY MOUTH DAILY 45 tablet 3   traZODone (DESYREL) 50 MG tablet Take 1 tablet (50 mg total) by mouth at bedtime. 30 tablet 3   vitamin B-12 (CYANOCOBALAMIN) 500 MCG tablet Take 500 mcg by mouth daily with lunch.     No current facility-administered medications for this visit.     Musculoskeletal: Strength &  Muscle Tone: within normal limits Gait & Station: normal Patient leans: N/A  Psychiatric Specialty Exam: Review of Systems  HENT:  Positive for congestion.   Eyes:  Positive for visual disturbance.  Respiratory:  Positive for cough.   Psychiatric/Behavioral:  The patient is nervous/anxious.   All other systems reviewed and are negative.  There were no vitals taken for this visit.There is no height or weight on file to calculate BMI.  General Appearance: Casual and Fairly Groomed  Eye Contact:  Good  Speech:  Clear and Coherent  Volume:  Normal  Mood:  Anxious  Affect:  Appropriate and Congruent  Thought Process:  Goal Directed  Orientation:  Full (Time, Place, and Person)  Thought Content: Rumination   Suicidal Thoughts:  No  Homicidal Thoughts:  No  Memory:  Immediate;   Good Recent;   Good Remote;   Good  Judgement:  Good  Insight:  Good  Psychomotor Activity:  Decreased  Concentration:  Concentration: Fair and Attention Span: Fair  Recall:  Good  Fund of Knowledge: Good  Language: Good  Akathisia:  No  Handed:  Right  AIMS (if indicated): not done  Assets:  Communication Skills Desire for Improvement Resilience Social Support Talents/Skills  ADL's:  Intact  Cognition: WNL  Sleep:  Good   Screenings: GAD-7    Flowsheet Row Counselor from 01/13/2021 in [facility]  Total GAD-7 Score 17      PHQ2-9    Flowsheet Row Video Visit from 11/21/2021 in [facility] Video Visit from 09/20/2021 in [facility] Video Visit from 08/01/2021 in [facility] Video Visit from 06/21/2021 in [facility] Video Visit from 04/26/2021 in [facility]  PHQ-2 Total Score 2 0 1 1 1   PHQ-9 Total Score 6 -- -- -- --      Flowsheet Row  Video Visit from 09/20/2021 in [facility] Video Visit from 08/01/2021 in [facility] from 07/01/2021 in [place]  C-SSRS RISK CATEGORY No Risk No Risk No Risk        Assessment and Plan: This patient is a 56 year old female with a history of substance abuse in remission depression insomnia and severe visual impairment which seems to be getting worse.  She does feel that her psychiatric medications are helping.  She will continue clonazepam 0.5 mg 3 times daily as needed for anxiety, trazodone 50 mg at bedtime for sleep and Zoloft 150 mg for depression.  She will return to see me in 6 weeks   [name], MD 11/21/2021, 11:27 AM

## 2021-11-27 ENCOUNTER — Encounter (HOSPITAL_COMMUNITY): Payer: BC Managed Care – PPO

## 2021-11-27 ENCOUNTER — Ambulatory Visit (HOSPITAL_COMMUNITY): Payer: BC Managed Care – PPO | Admitting: Physician Assistant

## 2021-12-11 ENCOUNTER — Telehealth (HOSPITAL_COMMUNITY): Payer: Self-pay | Admitting: *Deleted

## 2021-12-11 NOTE — Telephone Encounter (Signed)
Form from [facility] received via fax on 12/25/2021. Per notes on form, for provider to please provide treatment dates from 08-25-2020 thru present.   Staff called patient to get more clarity and to get release of information completed before forms can be sent to medical records due to request being asked for on form.

## 2021-12-26 ENCOUNTER — Inpatient Hospital Stay (HOSPITAL_COMMUNITY): Payer: BC Managed Care – PPO | Attending: Hematology

## 2021-12-26 ENCOUNTER — Encounter: Payer: Self-pay | Admitting: Hematology and Oncology

## 2021-12-26 DIAGNOSIS — D45 Polycythemia vera: Secondary | ICD-10-CM | POA: Insufficient documentation

## 2021-12-26 DIAGNOSIS — G893 Neoplasm related pain (acute) (chronic): Secondary | ICD-10-CM

## 2021-12-26 LAB — CBC WITH DIFFERENTIAL/PLATELET
Abs Immature Granulocytes: 0.01 10*3/uL (ref 0.00–0.07)
Basophils Absolute: 0 10*3/uL (ref 0.0–0.1)
Basophils Relative: 0 %
Eosinophils Absolute: 0.1 10*3/uL (ref 0.0–0.5)
Eosinophils Relative: 1 %
HCT: 39.3 % (ref 36.0–46.0)
Hemoglobin: 12.5 g/dL (ref 12.0–15.0)
Immature Granulocytes: 0 %
Lymphocytes Relative: 32 %
Lymphs Abs: 1.5 10*3/uL (ref 0.7–4.0)
MCH: 29.5 pg (ref 26.0–34.0)
MCHC: 31.8 g/dL (ref 30.0–36.0)
MCV: 92.7 fL (ref 80.0–100.0)
Monocytes Absolute: 0.4 10*3/uL (ref 0.1–1.0)
Monocytes Relative: 7 %
Neutro Abs: 2.8 10*3/uL (ref 1.7–7.7)
Neutrophils Relative %: 60 %
Platelets: 188 10*3/uL (ref 150–400)
RBC: 4.24 MIL/uL (ref 3.87–5.11)
RDW: 14.7 % (ref 11.5–15.5)
WBC: 4.7 10*3/uL (ref 4.0–10.5)
nRBC: 0 % (ref 0.0–0.2)

## 2021-12-27 ENCOUNTER — Other Ambulatory Visit (HOSPITAL_COMMUNITY): Payer: Self-pay | Admitting: Physician Assistant

## 2021-12-27 ENCOUNTER — Encounter (HOSPITAL_COMMUNITY): Payer: Self-pay | Admitting: Hematology and Oncology

## 2021-12-27 DIAGNOSIS — D45 Polycythemia vera: Secondary | ICD-10-CM

## 2021-12-27 MED ORDER — TRAMADOL HCL 50 MG PO TABS: 50.0000 mg | ORAL_TABLET | Freq: Every day | ORAL | 0 refills | Status: DC | PRN

## 2022-01-01 ENCOUNTER — Encounter (HOSPITAL_COMMUNITY): Payer: BC Managed Care – PPO

## 2022-01-01 ENCOUNTER — Ambulatory Visit (HOSPITAL_COMMUNITY): Payer: BC Managed Care – PPO | Admitting: Physician Assistant

## 2022-01-19 ENCOUNTER — Encounter (HOSPITAL_COMMUNITY): Payer: Self-pay | Admitting: Psychiatry

## 2022-01-19 ENCOUNTER — Telehealth (INDEPENDENT_AMBULATORY_CARE_PROVIDER_SITE_OTHER): Payer: BC Managed Care – PPO | Admitting: Psychiatry

## 2022-01-19 ENCOUNTER — Other Ambulatory Visit: Payer: Self-pay

## 2022-01-19 DIAGNOSIS — F321 Major depressive disorder, single episode, moderate: Secondary | ICD-10-CM

## 2022-01-19 DIAGNOSIS — F429 Obsessive-compulsive disorder, unspecified: Secondary | ICD-10-CM

## 2022-01-19 DIAGNOSIS — F5105 Insomnia due to other mental disorder: Secondary | ICD-10-CM

## 2022-01-19 DIAGNOSIS — F418 Other specified anxiety disorders: Secondary | ICD-10-CM

## 2022-01-19 MED ORDER — TRAZODONE HCL 50 MG PO TABS: 50.0000 mg | ORAL_TABLET | Freq: Every day | ORAL | 3 refills | Status: DC

## 2022-01-19 MED ORDER — SERTRALINE HCL 100 MG PO TABS: ORAL_TABLET | ORAL | 3 refills | Status: DC

## 2022-01-19 MED ORDER — CLONAZEPAM 0.5 MG PO TABS: 0.5000 mg | ORAL_TABLET | Freq: Three times a day (TID) | ORAL | 2 refills | Status: DC | PRN

## 2022-01-19 NOTE — Progress Notes (Signed)
Virtual Visit via Video Note  I connected with Cheryl Cross on 01/19/22 at 11:00 AM EST by a video enabled telemedicine application and verified that I am speaking with the correct person using two identifiers.  Location: Patient: home Provider: office   I discussed the limitations of evaluation and management by telemedicine and the availability of in person appointments. The patient expressed understanding and agreed to proceed.     I discussed the assessment and treatment plan with the patient. The patient was provided an opportunity to ask questions and all were answered. The patient agreed with the plan and demonstrated an understanding of the instructions.   The patient was advised to call back or seek an in-person evaluation if the symptoms worsen or if the condition fails to improve as anticipated.  I provided 20 minutes of non-face-to-face time during this encounter.   Cheryl Spiller, MD  [facility] MD/PA/NP OP Progress Note  01/19/2022 11:25 AM Cheryl Cross  MRN:  [id number]  Chief Complaint:  Chief Complaint  Patient presents with   Anxiety   Depression   Follow-up   HPI: : This patient is a 56 year old widowed white female who lives alone in [place].  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  Returns for follow-up after 2 months.  She states she is doing about the same.  She did get approved for long-term disability through [facility] which is taken some financial stress off of her.  She is still dealing with her parents health issues and her declining vision.  At times she gets overwhelmed and anxious but the Klonopin helps to some degree.  I offered to increase the Zoloft up to 200 mg but she declined at this point.  Right now she is having a lot of problems with environmental allergies which she states "make me feel irritable."  She denies any thoughts of self-harm or suicidal ideation.  She is  worried about how she will manage once her vision gets worse.  I offered to refer her to counseling but she declined this for now. Visit Diagnosis:    ICD-10-CM   1. Major depressive disorder, single episode, moderate (HCC)  F32.1 sertraline (ZOLOFT) 100 MG tablet    2. Obsessive-compulsive disorder, unspecified type  F42.9 sertraline (ZOLOFT) 100 MG tablet    3. Insomnia secondary to depression with anxiety  F51.05 traZODone (DESYREL) 50 MG tablet   F41.8       Past Psychiatric History: Psychiatric hospitalization in 2013, otherwise outpatient treatment  Past Medical History:  Past Medical History:  Diagnosis Date   Abdominal pain, right upper quadrant 06/10/2018   Anxiety    Arthritis    Bursitis    DDD (degenerative disc disease)    Depression    Fibromyalgia    Obsessive-compulsive disorder    Overactive bladder    Polycythemia vera(238.4) January 2013   PTSD (post-traumatic stress disorder)    PVC's (premature ventricular contractions)    pt. placed on heart monitor x 24hours, everything fine    Sesamoiditis February 2017    Past Surgical History:  Procedure Laterality Date   ABDOMINAL HYSTERECTOMY     BIOPSY  01/04/2021   Procedure: BIOPSY;  Surgeon: [name], MD;  [facility];  Service: Gastroenterology;;  small bowel biopsy   COLONOSCOPY WITH PROPOFOL N/A 01/04/2021   Procedure: COLONOSCOPY WITH PROPOFOL;  Surgeon: [name], MD;  [facility];  Service: Gastroenterology;  Laterality: N/A;  1115   ESOPHAGOGASTRODUODENOSCOPY N/A 09/10/2014   Procedure: ESOPHAGOGASTRODUODENOSCOPY (EGD);  Surgeon: Rogene Houston, MD;  [facility];  Service: Endoscopy;  Laterality: N/A;  130 - moved to 3:15 - Ann to notify   ESOPHAGOGASTRODUODENOSCOPY (EGD) WITH PROPOFOL N/A 01/04/2021   Procedure: ESOPHAGOGASTRODUODENOSCOPY (EGD) WITH PROPOFOL;  Surgeon: [name], MD;  [facility];  Service:  Gastroenterology;  Laterality: N/A;   LUMBAR FUSION     POLYPECTOMY  01/04/2021   Procedure: POLYPECTOMY INTESTINAL;  Surgeon: [name], MD;  [facility];  Service: Gastroenterology;;  cecal lesion; ascending colon polyp;    TOOTH EXTRACTION Left Aug 2016    Family Psychiatric History: see below  Family History:  Family History  Problem Relation Age of Onset   Cancer - Colon Mother        age 44   Anxiety disorder Mother    Diabetes Mother    Atrial fibrillation Father    Lung cancer Father    Atrial fibrillation Brother    Bipolar disorder Neg Hx    Dementia Neg Hx    Drug abuse Neg Hx    Paranoid behavior Neg Hx    Schizophrenia Neg Hx    Seizures Neg Hx    Sexual abuse Neg Hx    Physical abuse Neg Hx     Social History:  Social History   Socioeconomic History   Marital status: Widowed    Spouse name: Not on file   Number of children: 0   Years of education: BA   Highest education level: Not on file  Occupational History   Occupation: [employer]- on leave (since 08/2020)  Tobacco Use   Smoking status: Every Day    Packs/day: 1.00    Years: 15.00    Pack years: 15.00    Types: Cigarettes    Start date: 11/12/1996   Smokeless tobacco: Never   Tobacco comments:    1 pack a day x 20 yrs.  Vaping Use   Vaping Use: Never used  Substance and Sexual Activity   Alcohol use: No    Alcohol/week: 0.0 standard drinks   Drug use: No    Types: Hydrocodone, Benzodiazepines   Sexual activity: Not on file  Other Topics Concern   Not on file  Social History Narrative   Right handed   2-3 glasses soda per day   Lives alone   Social Determinants of Health   Financial Resource Strain: Not on file  Food Insecurity: Not on file  Transportation Needs: Not on file  Physical Activity: Not on file  Stress: Not on file  Social Connections: Not on file    Allergies:  Allergies  Allergen Reactions   Gabapentin Hives    Hydrocodone-Acetaminophen Nausea Only   Penicillins Other (See Comments)    Unknown child hood reaction .Did it involve swelling of the face/tongue/throat, SOB, or low BP? Unknown Did it involve sudden or severe rash/hives, skin peeling, or any reaction on the inside of your mouth or nose? Unknown Did you need to seek medical attention at a hospital or doctor's office? Unknown When did it last happen?       If all above answers are NO, may proceed with cephalosporin use.     Metabolic Disorder Labs: No results found for: HGBA1C, MPG No results found for: PROLACTIN No results found for: CHOL, TRIG, HDL, CHOLHDL, VLDL, LDLCALC Lab Results  Component  Value Date   TSH 1.770 10/13/2020   TSH 1.490 05/28/2016    Therapeutic Level Labs: No results found for: LITHIUM No results found for: VALPROATE No components found for:  CBMZ  Current Medications: Current Outpatient Medications  Medication Sig Dispense Refill   acetaminophen (TYLENOL) 325 MG tablet Take 2 tablets by mouth every 4 (four) hours as needed.     aspirin 81 MG tablet Take 1 tablet (81 mg total) by mouth daily. For Polycythemia Vera.     clonazePAM (KLONOPIN) 0.5 MG tablet Take 1 tablet (0.5 mg total) by mouth 3 (three) times daily as needed for anxiety. 90 tablet 2   dicyclomine (BENTYL) 10 MG capsule Take 1 capsule (10 mg total) by mouth every 12 (twelve) hours as needed (abdominal pain). 60 capsule 2   sertraline (ZOLOFT) 100 MG tablet TAKE 1 AND 1/2 TABLETS(150 MG) BY MOUTH DAILY 45 tablet 3   traMADol (ULTRAM) 50 MG tablet Take 1 tablet (50 mg total) by mouth daily as needed for severe pain. 30 tablet 0   traZODone (DESYREL) 50 MG tablet Take 1 tablet (50 mg total) by mouth at bedtime. 30 tablet 3   vitamin B-12 (CYANOCOBALAMIN) 500 MCG tablet Take 500 mcg by mouth daily with lunch.     No current facility-administered medications for this visit.     Musculoskeletal: Strength & Muscle Tone: within normal  limits Gait & Station: normal Patient leans: N/A  Psychiatric Specialty Exam: Review of Systems  Constitutional:  Positive for appetite change.  Eyes:  Positive for visual disturbance.  Psychiatric/Behavioral:  The patient is nervous/anxious.   All other systems reviewed and are negative.  There were no vitals taken for this visit.There is no height or weight on file to calculate BMI.  General Appearance: Casual and Fairly Groomed  Eye Contact:  Good  Speech:  Clear and Coherent  Volume:  Normal  Mood:  Anxious and Euthymic  Affect:  Appropriate and Congruent  Thought Process:  Goal Directed  Orientation:  Full (Time, Place, and Person)  Thought Content: Rumination   Suicidal Thoughts:  No  Homicidal Thoughts:  No  Memory:  Immediate;   Good Recent;   Good Remote;   Good  Judgement:  Good  Insight:  Good  Psychomotor Activity:  Normal  Concentration:  Concentration: Good and Attention Span: Good  Recall:  Good  Fund of Knowledge: Good  Language: Good  Akathisia:  No  Handed:  Right  AIMS (if indicated): not done  Assets:  Communication Skills Desire for Improvement Resilience Social Support  ADL's:  Intact  Cognition: WNL  Sleep:  Good   Screenings: GAD-7    Flowsheet Row Counselor from 01/13/2021 in [facility]  Total GAD-7 Score 17      PHQ2-9    Flowsheet Row Video Visit from 01/19/2022 in [place] Video Visit from 11/21/2021 in [place] Video Visit from 09/20/2021 in [place] [facility] Video Visit from 08/01/2021 in [place] Video Visit from 06/21/2021 in [facility]  PHQ-2 Total Score 1 2 0 1 1  PHQ-9 Total Score -- 6 -- -- --      Flowsheet Row Video Visit from 01/19/2022 in [facility] Video Visit from 09/20/2021 in [facility] Video Visit from 08/01/2021 in [place] No Risk No Risk No  Risk        Assessment and Plan: Patient is a 56 year old female with a history of substance abuse in remission depression insomnia and severe visual impairment which seems to be getting worse.  She still feels like her medications are helpful.  She will continue clonazepam 0.5 mg 3 times a day daily as needed for anxiety, trazodone 50 mg at bedtime for sleep and Zoloft 150 mg daily for depression and anxiety.  She will return to see me in 2 months  Collaboration of Care: Collaboration of Care: Primary Care Provider AEB chart notes will be made available to PCP at patient's request  Patient/Guardian was advised Release of Information must be obtained prior to any record release in order to collaborate their care with an outside provider. Patient/Guardian was advised if they have not already done so to contact the registration department to sign all necessary forms in order for [place] to release information regarding their care.   Consent: Patient/Guardian gives verbal consent for treatment and assignment of benefits for services provided during this visit. Patient/Guardian expressed understanding and agreed to proceed.    Cheryl Spiller, MD 01/19/2022, 11:25 AM

## 2022-02-05 ENCOUNTER — Encounter: Payer: Self-pay | Admitting: Podiatry

## 2022-02-05 ENCOUNTER — Ambulatory Visit: Payer: BC Managed Care – PPO | Admitting: Podiatry

## 2022-02-05 ENCOUNTER — Other Ambulatory Visit: Payer: Self-pay

## 2022-02-05 DIAGNOSIS — M778 Other enthesopathies, not elsewhere classified: Secondary | ICD-10-CM

## 2022-02-05 MED ORDER — TRIAMCINOLONE ACETONIDE 10 MG/ML IJ SUSP
30.0000 mg | Freq: Once | INTRAMUSCULAR | Status: AC
  Administered 2022-02-05: 30 mg

## 2022-02-05 NOTE — Progress Notes (Signed)
Subjective:  ? ?Patient ID: Cheryl Cross, female   DOB: 56 y.o.   MRN: [id number]  ? ?HPI ?Patient presents stating the joints have really been bothering her on both feet and also the big toe joint right.  She knows someday she may need surgery with shortening type of bone procedures but she is taking care of her parents and is looking for some kind of short-term relief and hopeful improvement with her orthotics ? ? ?ROS ? ? ?   ?Objective:  ?Physical Exam  ?Neurovascular status intact with inflammation mostly centered around the second MPJ bilateral with the third also somewhat involved at the first MPJ right is inflamed with pain ? ?   ?Assessment:  ?Chronic capsulitis bilateral that was treated about 6 months ago and did give relief for a relative period of time ? ?   ?Plan:  ?H&P reviewed condition went ahead today did sterile prep and aspirated the second and third MPJs bilateral and carefully injected quarter cc dexamethasone quarter cc Kenalog and then did sterile prep of the first MPJ right and injected with 3 mg Kenalog 5 mg Xylocaine.  Patient will be seen back may need to make some other changes but organ to see what kind of response we get to continue conservative treatment ?   ? ? ?

## 2022-02-22 ENCOUNTER — Other Ambulatory Visit (HOSPITAL_COMMUNITY): Payer: Self-pay | Admitting: Internal Medicine

## 2022-02-22 DIAGNOSIS — N644 Mastodynia: Secondary | ICD-10-CM

## 2022-02-22 DIAGNOSIS — Z1231 Encounter for screening mammogram for malignant neoplasm of breast: Secondary | ICD-10-CM

## 2022-02-22 DIAGNOSIS — Z13820 Encounter for screening for osteoporosis: Secondary | ICD-10-CM

## 2022-03-01 ENCOUNTER — Ambulatory Visit (HOSPITAL_COMMUNITY)
Admission: RE | Admit: 2022-03-01 | Discharge: 2022-03-01 | Disposition: A | Discharge status: Home / Self Care | Payer: BC Managed Care – PPO | Source: Ambulatory Visit | Attending: Internal Medicine | Admitting: Internal Medicine

## 2022-03-01 DIAGNOSIS — Z1231 Encounter for screening mammogram for malignant neoplasm of breast: Secondary | ICD-10-CM | POA: Insufficient documentation

## 2022-03-01 IMAGING — MG MM DIGITAL SCREENING BILAT W/ TOMO AND CAD
6 of 10 series · 6 of 30 positions shown · non-contrast
Comparison: Previous exam(s).

CLINICAL DATA: Screening.

EXAM:
DIGITAL SCREENING BILATERAL MAMMOGRAM WITH TOMOSYNTHESIS AND CAD
TECHNIQUE: Bilateral screening digital craniocaudal and mediolateral oblique
mammograms were obtained. Bilateral screening digital breast
tomosynthesis was performed. The images were evaluated with
computer-aided detection.

[L MLO synth-2D]
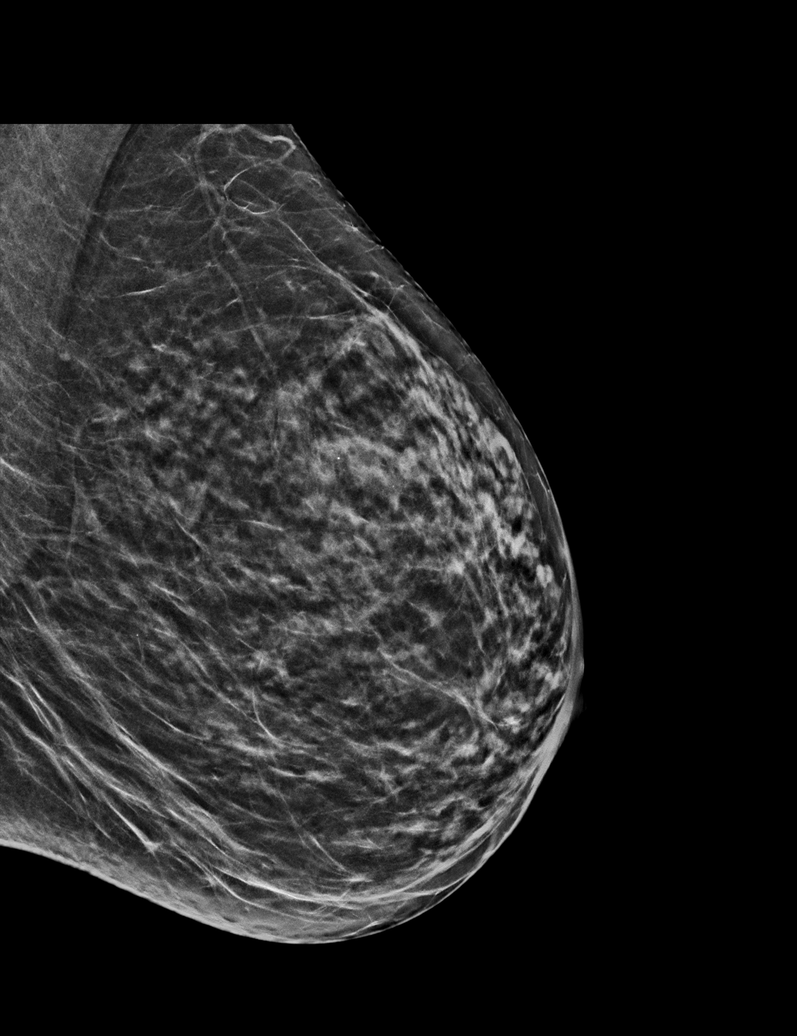

[L CC synth-2D]
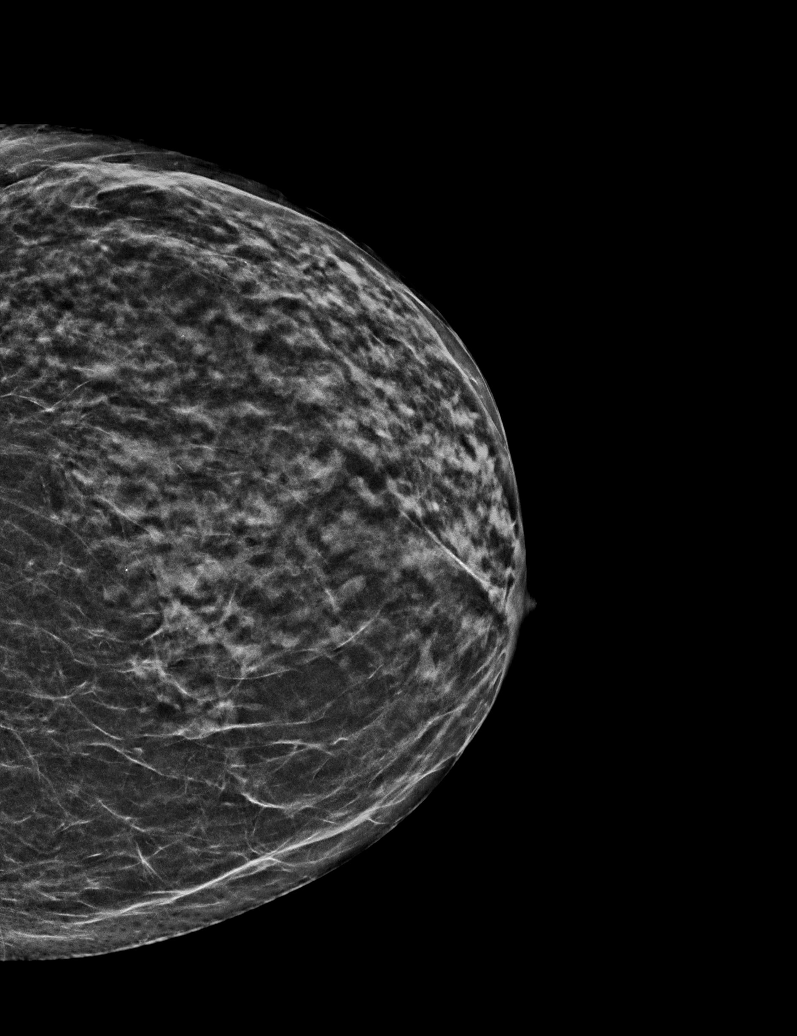

[R CC synth-2D]
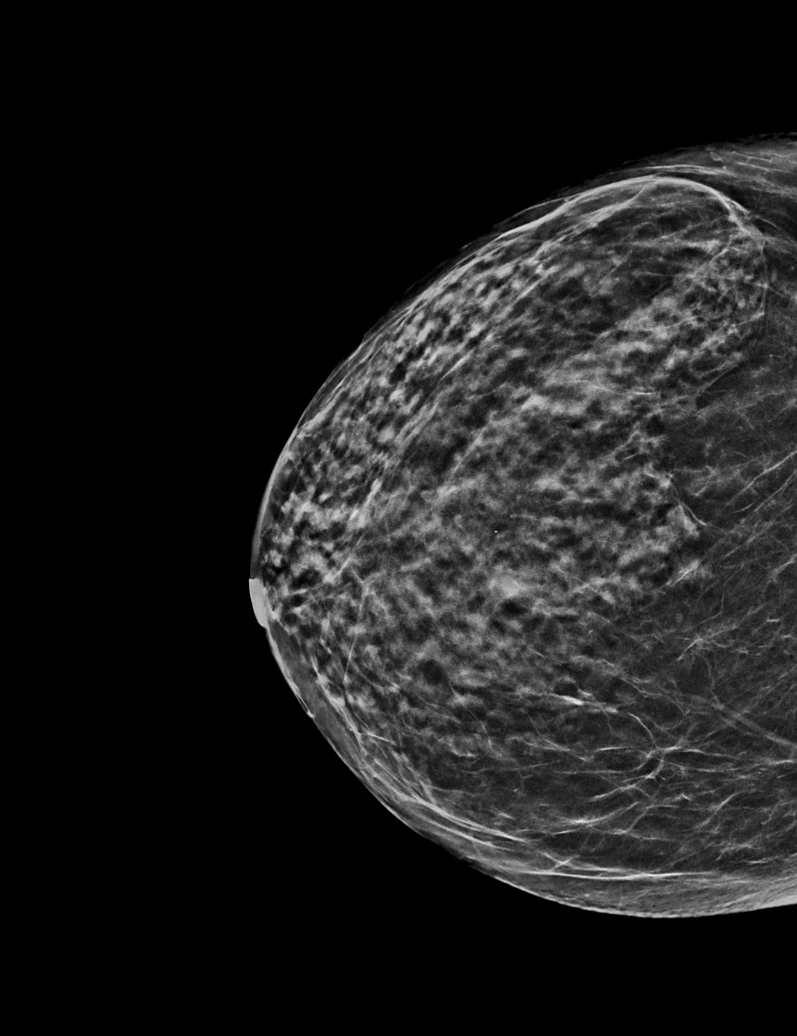

[R MLO synth-2D (1 of 2)]
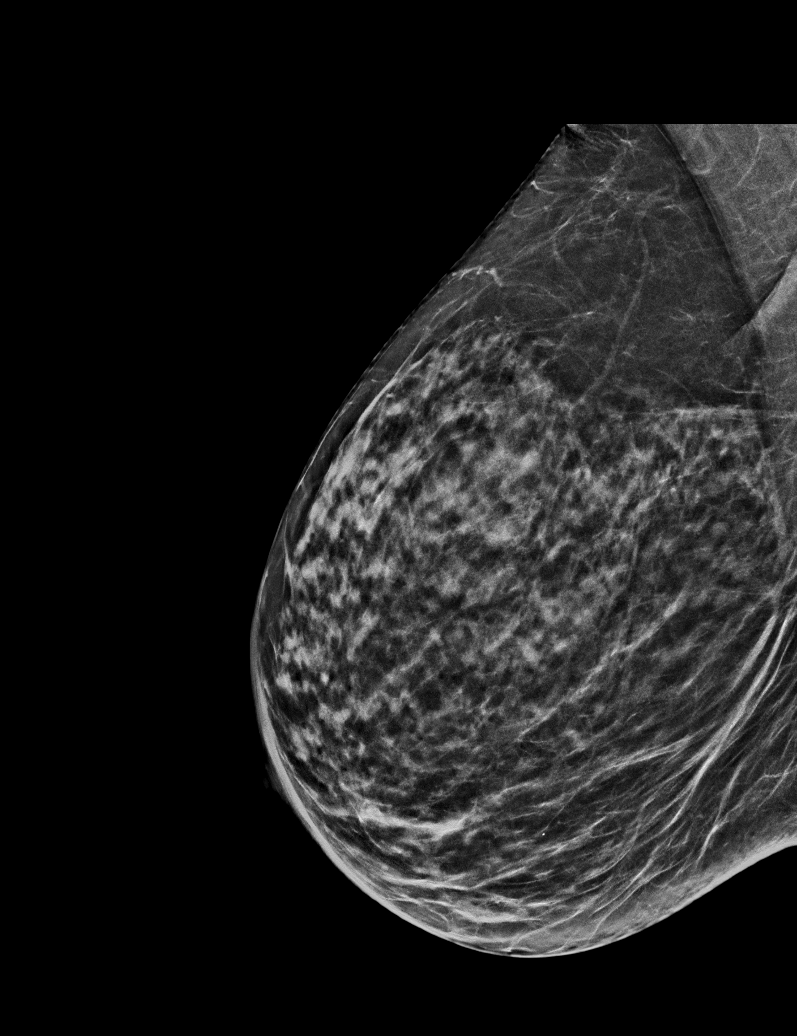

[R MLO synth-2D (2 of 2)]
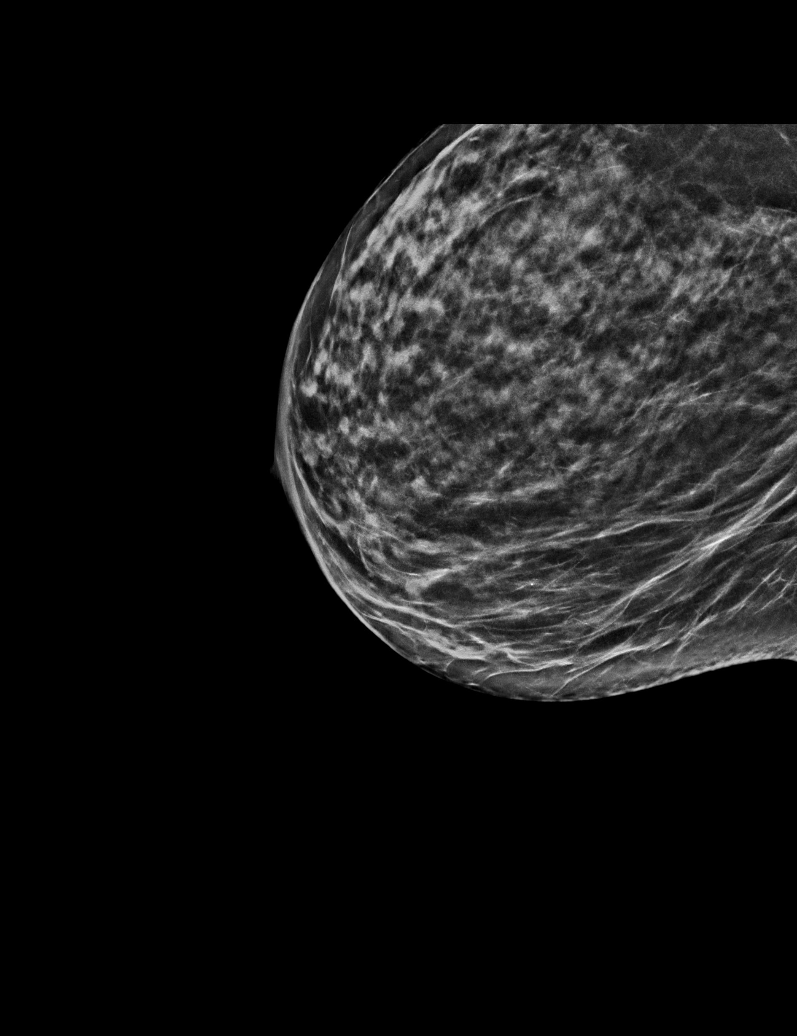

[R CC tomo · tomo slice 24/47.0]
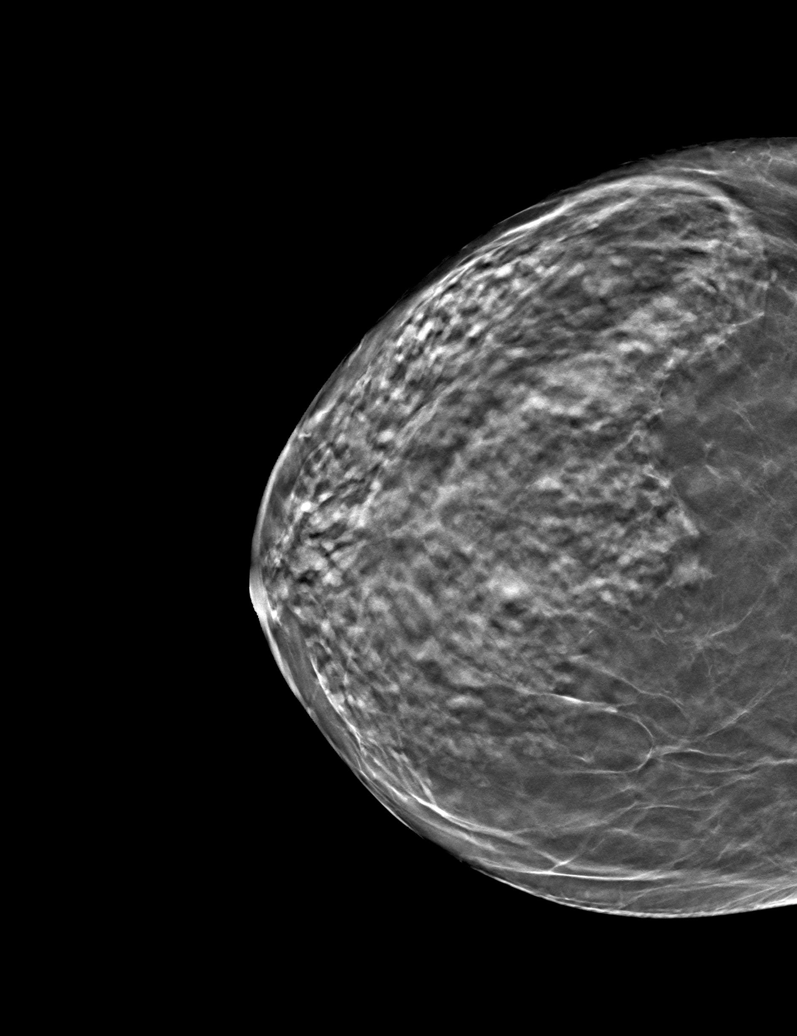

[6 of 30 positions shown; findings below may reference images not displayed]

ACR Breast Density Category c: The breast tissue is heterogeneously
dense, which may obscure small masses.
FINDINGS: There are no findings suspicious for malignancy.
IMPRESSION: No mammographic evidence of malignancy. A result letter of this
screening mammogram will be mailed directly to the patient.

RECOMMENDATION:
Screening mammogram in one year. (Code:[V2])

BI-RADS CATEGORY  1: Negative.

## 2022-03-22 ENCOUNTER — Telehealth (HOSPITAL_COMMUNITY): Payer: BC Managed Care – PPO | Admitting: Psychiatry

## 2022-03-23 ENCOUNTER — Telehealth (INDEPENDENT_AMBULATORY_CARE_PROVIDER_SITE_OTHER): Payer: BC Managed Care – PPO | Admitting: Psychiatry

## 2022-03-23 ENCOUNTER — Encounter (HOSPITAL_COMMUNITY): Payer: Self-pay | Admitting: Psychiatry

## 2022-03-23 DIAGNOSIS — F418 Other specified anxiety disorders: Secondary | ICD-10-CM | POA: Diagnosis not present

## 2022-03-23 DIAGNOSIS — F5105 Insomnia due to other mental disorder: Secondary | ICD-10-CM | POA: Diagnosis not present

## 2022-03-23 DIAGNOSIS — F321 Major depressive disorder, single episode, moderate: Secondary | ICD-10-CM

## 2022-03-23 DIAGNOSIS — F429 Obsessive-compulsive disorder, unspecified: Secondary | ICD-10-CM | POA: Diagnosis not present

## 2022-03-23 MED ORDER — CLONAZEPAM 0.5 MG PO TABS: 0.5000 mg | ORAL_TABLET | Freq: Three times a day (TID) | ORAL | 2 refills | Status: DC | PRN

## 2022-03-23 MED ORDER — TRAZODONE HCL 50 MG PO TABS: 50.0000 mg | ORAL_TABLET | Freq: Every day | ORAL | 3 refills | Status: DC

## 2022-03-23 MED ORDER — SERTRALINE HCL 100 MG PO TABS: ORAL_TABLET | ORAL | 3 refills | Status: DC

## 2022-03-23 NOTE — Progress Notes (Signed)
Virtual Visit via Video Note ? ?I connected with Cheryl Cross on 03/23/22 at 10:40 AM EDT by a video enabled telemedicine application and verified that I am speaking with the correct person using two identifiers. ? ?Location: ?Patient: home ?Provider: home office ?  ?I discussed the limitations of evaluation and management by telemedicine and the availability of in person appointments. The patient expressed understanding and agreed to proceed. ? ? ?  ?I discussed the assessment and treatment plan with the patient. The patient was provided an opportunity to ask questions and all were answered. The patient agreed with the plan and demonstrated an understanding of the instructions. ?  ?The patient was advised to call back or seek an in-person evaluation if the symptoms worsen or if the condition fails to improve as anticipated. ? ?I provided 15 minutes of non-face-to-face time during this encounter. ? ? ?[name], MD ? ?[facility] MD/PA/NP OP Progress Note ? ?03/23/2022 11:06 AM ?Cheryl Cross  ?MRN:  [id number] ? ?Chief Complaint:  ?Chief Complaint  ?Patient presents with  ? Anxiety  ? Depression  ? Follow-up  ? ?HPI: This patient is a 56 year old widowed white female who lives alone in [place].  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 ?Returns for follow-up after 2 months.  She is very excited because she was finally approved for Social Security disability.  This is based on her visual problems-retinitis pigmentosa.  She is really not able to work or even drive.  She is very relieved have the source of income. ? ?In terms of mood she is doing pretty well.  She is a little bit depressed when she first got the disability because it became so real for that she is truly disabled.  However now she is feeling, and a sense of relief.  She denies significant depression.  She has occasional anxiety but the clonazepam helps.  She is sleeping well.  She  denies thoughts of self-harm or suicide. ?Visit Diagnosis:  ?  ICD-10-CM   ?1. Major depressive disorder, single episode, moderate (HCC)  F32.1 sertraline (ZOLOFT) 100 MG tablet  ?  ?2. Obsessive-compulsive disorder, unspecified type  F42.9 sertraline (ZOLOFT) 100 MG tablet  ?  ?3. Insomnia secondary to depression with anxiety  F51.05 traZODone (DESYREL) 50 MG tablet  ? F41.8   ?  ? ? ?Past Psychiatric History: Psychiatric hospitalization in 2013, otherwise outpatient treatment ? ?Past Medical History:  ?Past Medical History:  ?Diagnosis Date  ? Abdominal pain, right upper quadrant 06/10/2018  ? Anxiety   ? Arthritis   ? Bursitis   ? DDD (degenerative disc disease)   ? Depression   ? Fibromyalgia   ? Obsessive-compulsive disorder   ? Overactive bladder   ? Polycythemia vera(238.4) January 2013  ? PTSD (post-traumatic stress disorder)   ? PVC's (premature ventricular contractions)   ? pt. placed on heart monitor x 24hours, everything fine   ? Sesamoiditis February 2017  ?  ?Past Surgical History:  ?Procedure Laterality Date  ? ABDOMINAL HYSTERECTOMY    ? BIOPSY  01/04/2021  ? Procedure: BIOPSY;  Surgeon: Montez Morita, Quillian Quince, MD;  [facility];  Service: Gastroenterology;;  small bowel biopsy  ? COLONOSCOPY WITH PROPOFOL N/A 01/04/2021  ? Procedure: COLONOSCOPY WITH PROPOFOL;  Surgeon: [name], MD;  [facility];  Service: Gastroenterology;  Laterality: N/A;  1115  ? ESOPHAGOGASTRODUODENOSCOPY N/A 09/10/2014  ? Procedure: ESOPHAGOGASTRODUODENOSCOPY (  EGD);  Surgeon: Rogene Houston, MD;  [facility];  Service: Endoscopy;  Laterality: N/A;  130 - moved to 3:15 - Ann to notify  ? ESOPHAGOGASTRODUODENOSCOPY (EGD) WITH PROPOFOL N/A 01/04/2021  ? Procedure: ESOPHAGOGASTRODUODENOSCOPY (EGD) WITH PROPOFOL;  Surgeon: [name], MD;  [facility];  Service: Gastroenterology;  Laterality: N/A;  ? LUMBAR FUSION    ? POLYPECTOMY  01/04/2021  ?  Procedure: POLYPECTOMY INTESTINAL;  Surgeon: Montez Morita, Quillian Quince, MD;  [facility];  Service: Gastroenterology;;  cecal lesion; ascending colon polyp;   ? TOOTH EXTRACTION Left Aug 2016  ? ? ?Family Psychiatric History: see below ? ?Family History:  ?Family History  ?Problem Relation Age of Onset  ? Cancer - Colon Mother   ?     age 38  ? Anxiety disorder Mother   ? Diabetes Mother   ? Atrial fibrillation Father   ? Lung cancer Father   ? Atrial fibrillation Brother   ? Bipolar disorder Neg Hx   ? Dementia Neg Hx   ? Drug abuse Neg Hx   ? Paranoid behavior Neg Hx   ? Schizophrenia Neg Hx   ? Seizures Neg Hx   ? Sexual abuse Neg Hx   ? Physical abuse Neg Hx   ? ? ?Social History:  ?Social History  ? ?Socioeconomic History  ? Marital status: Widowed  ?  Spouse name: Not on file  ? Number of children: 0  ? Years of education: BA  ? Highest education level: Not on file  ?Occupational History  ? Occupation: [employer]- on leave (since 08/2020)  ?Tobacco Use  ? Smoking status: Every Day  ?  Packs/day: 1.00  ?  Years: 15.00  ?  Pack years: 15.00  ?  Types: Cigarettes  ?  Start date: 11/12/1996  ? Smokeless tobacco: Never  ? Tobacco comments:  ?  1 pack a day x 20 yrs.  ?Vaping Use  ? Vaping Use: Never used  ?Substance and Sexual Activity  ? Alcohol use: No  ?  Alcohol/week: 0.0 standard drinks  ? Drug use: No  ?  Types: Hydrocodone, Benzodiazepines  ? Sexual activity: Not on file  ?Other Topics Concern  ? Not on file  ?Social History Narrative  ? Right handed  ? 2-3 glasses soda per day  ? Lives alone  ? ?Social Determinants of Health  ? ?Financial Resource Strain: Not on file  ?Food Insecurity: Not on file  ?Transportation Needs: Not on file  ?Physical Activity: Not on file  ?Stress: Not on file  ?Social Connections: Not on file  ? ? ?Allergies:  ?Allergies  ?Allergen Reactions  ? Gabapentin Hives  ? Hydrocodone-Acetaminophen Nausea Only  ? Penicillins Other (See Comments)  ?  Unknown  child hood reaction ?.Did it involve swelling of the face/tongue/throat, SOB, or low BP? Unknown ?Did it involve sudden or severe rash/hives, skin peeling, or any reaction on the inside of your mouth or nose? Unknown ?Did you need to seek medical attention at a hospital or doctor's office? Unknown ?When did it last happen?       ?If all above answers are ?NO?, may proceed with cephalosporin use. ?  ? ? ?Metabolic Disorder Labs: ?No results found for: HGBA1C, MPG ?No results found for: PROLACTIN ?No results found for: CHOL, TRIG, HDL, CHOLHDL, VLDL, LDLCALC ?Lab Results  ?Component Value Date  ? TSH 1.770 10/13/2020  ? TSH 1.490 05/28/2016  ? ? ?Therapeutic  Level Labs: ?No results found for: LITHIUM ?No results found for: VALPROATE ?No components found for:  CBMZ ? ?Current Medications: ?Current Outpatient Medications  ?Medication Sig Dispense Refill  ? acetaminophen (TYLENOL) 325 MG tablet Take 2 tablets by mouth every 4 (four) hours as needed.    ? aspirin 81 MG tablet Take 1 tablet (81 mg total) by mouth daily. For Polycythemia Vera.    ? clonazePAM (KLONOPIN) 0.5 MG tablet Take 1 tablet (0.5 mg total) by mouth 3 (three) times daily as needed for anxiety. 90 tablet 2  ? dicyclomine (BENTYL) 10 MG capsule Take 1 capsule (10 mg total) by mouth every 12 (twelve) hours as needed (abdominal pain). 60 capsule 2  ? sertraline (ZOLOFT) 100 MG tablet TAKE 1 AND 1/2 TABLETS(150 MG) BY MOUTH DAILY 45 tablet 3  ? traMADol (ULTRAM) 50 MG tablet Take 1 tablet (50 mg total) by mouth daily as needed for severe pain. 30 tablet 0  ? traZODone (DESYREL) 50 MG tablet Take 1 tablet (50 mg total) by mouth at bedtime. 30 tablet 3  ? vitamin B-12 (CYANOCOBALAMIN) 500 MCG tablet Take 500 mcg by mouth daily with lunch.    ? ?No current facility-administered medications for this visit.  ? ? ? ?Musculoskeletal: ?Strength & Muscle Tone: within normal limits ?Gait & Station: normal ?Patient leans: N/A ? ?Psychiatric Specialty Exam: ?Review of  Systems  ?Eyes:  Positive for visual disturbance.  ?All other systems reviewed and are negative.  ?There were no vitals taken for this visit.There is no height or weight on file to calculate BMI.  ?General App

## 2022-04-04 ENCOUNTER — Inpatient Hospital Stay (HOSPITAL_COMMUNITY): Payer: BC Managed Care – PPO | Attending: Physician Assistant

## 2022-04-04 DIAGNOSIS — D45 Polycythemia vera: Secondary | ICD-10-CM | POA: Insufficient documentation

## 2022-04-04 LAB — CBC WITH DIFFERENTIAL/PLATELET
Abs Immature Granulocytes: 0.01 10*3/uL (ref 0.00–0.07)
Basophils Absolute: 0 10*3/uL (ref 0.0–0.1)
Basophils Relative: 1 %
Eosinophils Absolute: 0.1 10*3/uL (ref 0.0–0.5)
Eosinophils Relative: 2 %
HCT: 40.3 % (ref 36.0–46.0)
Hemoglobin: 12.9 g/dL (ref 12.0–15.0)
Immature Granulocytes: 0 %
Lymphocytes Relative: 34 %
Lymphs Abs: 1.6 10*3/uL (ref 0.7–4.0)
MCH: 30.7 pg (ref 26.0–34.0)
MCHC: 32 g/dL (ref 30.0–36.0)
MCV: 96 fL (ref 80.0–100.0)
Monocytes Absolute: 0.3 10*3/uL (ref 0.1–1.0)
Monocytes Relative: 6 %
Neutro Abs: 2.7 10*3/uL (ref 1.7–7.7)
Neutrophils Relative %: 57 %
Platelets: 197 10*3/uL (ref 150–400)
RBC: 4.2 MIL/uL (ref 3.87–5.11)
RDW: 13.5 % (ref 11.5–15.5)
WBC: 4.8 10*3/uL (ref 4.0–10.5)
nRBC: 0 % (ref 0.0–0.2)

## 2022-04-04 LAB — COMPREHENSIVE METABOLIC PANEL
ALT: 15 U/L (ref 0–44)
AST: 15 U/L (ref 15–41)
Albumin: 4.1 g/dL (ref 3.5–5.0)
Alkaline Phosphatase: 62 U/L (ref 38–126)
Anion gap: 2 — ABNORMAL LOW (ref 5–15)
BUN: 5 mg/dL — ABNORMAL LOW (ref 6–20)
CO2: 30 mmol/L (ref 22–32)
Calcium: 8.8 mg/dL — ABNORMAL LOW (ref 8.9–10.3)
Chloride: 109 mmol/L (ref 98–111)
Creatinine, Ser: 0.72 mg/dL (ref 0.44–1.00)
GFR, Estimated: >60 mL/min (ref >60)
Glucose, Bld: 86 mg/dL (ref 70–99)
Potassium: 3.8 mmol/L (ref 3.5–5.1)
Sodium: 141 mmol/L (ref 135–145)
Total Bilirubin: 0.5 mg/dL (ref 0.3–1.2)
Total Protein: 6.6 g/dL (ref 6.5–8.1)

## 2022-04-04 LAB — LACTATE DEHYDROGENASE: LDH: 111 U/L (ref 98–192)

## 2022-04-06 ENCOUNTER — Other Ambulatory Visit (HOSPITAL_COMMUNITY): Payer: Self-pay

## 2022-04-06 ENCOUNTER — Other Ambulatory Visit (HOSPITAL_COMMUNITY): Payer: BC Managed Care – PPO

## 2022-04-06 DIAGNOSIS — G893 Neoplasm related pain (acute) (chronic): Secondary | ICD-10-CM

## 2022-04-06 DIAGNOSIS — D45 Polycythemia vera: Secondary | ICD-10-CM

## 2022-04-06 MED ORDER — TRAMADOL HCL 50 MG PO TABS: 50.0000 mg | ORAL_TABLET | Freq: Every day | ORAL | 0 refills | Status: DC | PRN

## 2022-04-13 ENCOUNTER — Ambulatory Visit (HOSPITAL_COMMUNITY): Payer: BC Managed Care – PPO | Admitting: Physician Assistant

## 2022-04-13 ENCOUNTER — Encounter (HOSPITAL_COMMUNITY): Payer: BC Managed Care – PPO

## 2022-05-06 ENCOUNTER — Telehealth: Payer: BC Managed Care – PPO | Admitting: Family

## 2022-05-06 DIAGNOSIS — H109 Unspecified conjunctivitis: Secondary | ICD-10-CM | POA: Diagnosis not present

## 2022-05-06 MED ORDER — POLYMYXIN B-TRIMETHOPRIM 10000-0.1 UNIT/ML-% OP SOLN: 1.0000 [drp] | Freq: Four times a day (QID) | OPHTHALMIC | 0 refills | Status: DC

## 2022-05-23 ENCOUNTER — Telehealth (HOSPITAL_COMMUNITY): Payer: BC Managed Care – PPO | Admitting: Psychiatry

## 2022-05-24 ENCOUNTER — Telehealth (INDEPENDENT_AMBULATORY_CARE_PROVIDER_SITE_OTHER): Payer: BC Managed Care – PPO | Admitting: Psychiatry

## 2022-05-24 ENCOUNTER — Encounter (HOSPITAL_COMMUNITY): Payer: Self-pay | Admitting: Psychiatry

## 2022-05-24 DIAGNOSIS — F429 Obsessive-compulsive disorder, unspecified: Secondary | ICD-10-CM

## 2022-05-24 DIAGNOSIS — F418 Other specified anxiety disorders: Secondary | ICD-10-CM | POA: Diagnosis not present

## 2022-05-24 DIAGNOSIS — F5105 Insomnia due to other mental disorder: Secondary | ICD-10-CM

## 2022-05-24 DIAGNOSIS — F321 Major depressive disorder, single episode, moderate: Secondary | ICD-10-CM | POA: Diagnosis not present

## 2022-05-24 MED ORDER — SERTRALINE HCL 100 MG PO TABS: ORAL_TABLET | ORAL | 3 refills | Status: DC

## 2022-05-24 MED ORDER — CLONAZEPAM 0.5 MG PO TABS: 0.5000 mg | ORAL_TABLET | Freq: Three times a day (TID) | ORAL | 2 refills | Status: DC | PRN

## 2022-05-24 MED ORDER — TRAZODONE HCL 50 MG PO TABS: 50.0000 mg | ORAL_TABLET | Freq: Every day | ORAL | 3 refills | Status: DC

## 2022-05-24 NOTE — Progress Notes (Signed)
Virtual Visit via Video Note  I connected with Cheryl Cross on 05/24/22 at 11:00 AM EDT by a video enabled telemedicine application and verified that I am speaking with the correct person using two identifiers.  Location: Patient: home Provider:home office   I discussed the limitations of evaluation and management by telemedicine and the availability of in person appointments. The patient expressed understanding and agreed to proceed.      I discussed the assessment and treatment plan with the patient. The patient was provided an opportunity to ask questions and all were answered. The patient agreed with the plan and demonstrated an understanding of the instructions.   The patient was advised to call back or seek an in-person evaluation if the symptoms worsen or if the condition fails to improve as anticipated.  I provided 15 minutes of non-face-to-face time during this encounter.   [name], MD  [facility] MD/PA/NP OP Progress Note  05/24/2022 11:27 AM Cheryl Cross  MRN:  [id number]  Chief Complaint:  Chief Complaint  Patient presents with   Anxiety   Depression   Follow-up   HPI: This patient is a 56 year old widowed white female who lives alone in [place].  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  She is now on Social Security disability.  The patient returns for follow-up after 2 months.  Overall she is doing well.  She has been having a lot more back pain and had to go on prednisone for short time as well is having had ear infection recently.  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  Nevertheless she is trying to do projects around her house.  It is difficult for her to drive and she only goes to her parents and back.  She tries to stay independent by getting things delivered.  For the most part her mood is good and she denies significant  depression anxiety difficulty sleeping.  Getting her Social Security disability has taken a big load off of her shoulders. Visit Diagnosis:    ICD-10-CM   1. Major depressive disorder, single episode, moderate (HCC)  F32.1 sertraline (ZOLOFT) 100 MG tablet    2. Obsessive-compulsive disorder, unspecified type  F42.9 sertraline (ZOLOFT) 100 MG tablet    3. Insomnia secondary to depression with anxiety  F51.05 traZODone (DESYREL) 50 MG tablet   F41.8       Past Psychiatric History: Psychiatric hospitalization in 2013 otherwise outpatient treatment  Past Medical History:  Past Medical History:  Diagnosis Date   Abdominal pain, right upper quadrant 06/10/2018   Anxiety    Arthritis    Bursitis    DDD (degenerative disc disease)    Depression    Fibromyalgia    Obsessive-compulsive disorder    Overactive bladder    Polycythemia vera(238.4) January 2013   PTSD (post-traumatic stress disorder)    PVC's (premature ventricular contractions)    pt. placed on heart monitor x 24hours, everything fine    Sesamoiditis February 2017    Past Surgical History:  Procedure Laterality Date   ABDOMINAL HYSTERECTOMY     BIOPSY  01/04/2021   Procedure: BIOPSY;  Surgeon: [name], MD;  [facility];  Service: Gastroenterology;;  small bowel biopsy   COLONOSCOPY WITH PROPOFOL N/A 01/04/2021   Procedure: COLONOSCOPY WITH PROPOFOL;  Surgeon: [name], MD;  [facility];  Service: Gastroenterology;  Laterality: N/A;  1115   ESOPHAGOGASTRODUODENOSCOPY N/A 09/10/2014   Procedure: ESOPHAGOGASTRODUODENOSCOPY (EGD);  Surgeon: Rogene Houston, MD;  [facility];  Service: Endoscopy;  Laterality: N/A;  130 - moved to 3:15 - Ann to notify   ESOPHAGOGASTRODUODENOSCOPY (EGD) WITH PROPOFOL N/A 01/04/2021   Procedure: ESOPHAGOGASTRODUODENOSCOPY (EGD) WITH PROPOFOL;  Surgeon: [name], MD;  [facility];  Service:  Gastroenterology;  Laterality: N/A;   LUMBAR FUSION     POLYPECTOMY  01/04/2021   Procedure: POLYPECTOMY INTESTINAL;  Surgeon: [name], MD;  [facility];  Service: Gastroenterology;;  cecal lesion; ascending colon polyp;    TOOTH EXTRACTION Left Aug 2016    Family Psychiatric History: See below  Family History:  Family History  Problem Relation Age of Onset   Cancer - Colon Mother        age 23   Anxiety disorder Mother    Diabetes Mother    Atrial fibrillation Father    Lung cancer Father    Atrial fibrillation Brother    Bipolar disorder Neg Hx    Dementia Neg Hx    Drug abuse Neg Hx    Paranoid behavior Neg Hx    Schizophrenia Neg Hx    Seizures Neg Hx    Sexual abuse Neg Hx    Physical abuse Neg Hx     Social History:  Social History   Socioeconomic History   Marital status: Widowed    Spouse name: Not on file   Number of children: 0   Years of education: BA   Highest education level: Not on file  Occupational History   Occupation: [employer]- on leave (since 08/2020)  Tobacco Use   Smoking status: Every Day    Packs/day: 1.00    Years: 15.00    Total pack years: 15.00    Types: Cigarettes    Start date: 11/12/1996   Smokeless tobacco: Never   Tobacco comments:    1 pack a day x 20 yrs.  Vaping Use   Vaping Use: Never used  Substance and Sexual Activity   Alcohol use: No    Alcohol/week: 0.0 standard drinks of alcohol   Drug use: No    Types: Hydrocodone, Benzodiazepines   Sexual activity: Not on file  Other Topics Concern   Not on file  Social History Narrative   Right handed   2-3 glasses soda per day   Lives alone   Social Determinants of Health   Financial Resource Strain: Not on file  Food Insecurity: Not on file  Transportation Needs: Not on file  Physical Activity: Not on file  Stress: Not on file  Social Connections: Not on file    Allergies:  Allergies  Allergen Reactions    Gabapentin Hives   Hydrocodone-Acetaminophen Nausea Only   Penicillins Other (See Comments)    Unknown child hood reaction .Did it involve swelling of the face/tongue/throat, SOB, or low BP? Unknown Did it involve sudden or severe rash/hives, skin peeling, or any reaction on the inside of your mouth or nose? Unknown Did you need to seek medical attention at a hospital or doctor's office? Unknown When did it last happen?       If all above answers are "NO", may proceed with cephalosporin use.     Metabolic Disorder Labs: No results found for: "HGBA1C", "MPG" No results found for: "PROLACTIN" No results found for: "CHOL", "TRIG", "HDL", "CHOLHDL", "VLDL", "LDLCALC" Lab Results  Component  Value Date   TSH 1.770 10/13/2020   TSH 1.490 05/28/2016    Therapeutic Level Labs: No results found for: "LITHIUM" No results found for: "VALPROATE" No results found for: "CBMZ"  Current Medications: Current Outpatient Medications  Medication Sig Dispense Refill   acetaminophen (TYLENOL) 325 MG tablet Take 2 tablets by mouth every 4 (four) hours as needed.     aspirin 81 MG tablet Take 1 tablet (81 mg total) by mouth daily. For Polycythemia Vera.     clonazePAM (KLONOPIN) 0.5 MG tablet Take 1 tablet (0.5 mg total) by mouth 3 (three) times daily as needed for anxiety. 90 tablet 2   dicyclomine (BENTYL) 10 MG capsule Take 1 capsule (10 mg total) by mouth every 12 (twelve) hours as needed (abdominal pain). 60 capsule 2   sertraline (ZOLOFT) 100 MG tablet TAKE 1 AND 1/2 TABLETS(150 MG) BY MOUTH DAILY 45 tablet 3   traMADol (ULTRAM) 50 MG tablet Take 1 tablet (50 mg total) by mouth daily as needed for severe pain. 30 tablet 0   traZODone (DESYREL) 50 MG tablet Take 1 tablet (50 mg total) by mouth at bedtime. 30 tablet 3   trimethoprim-polymyxin b (POLYTRIM) ophthalmic solution Place 1 drop into the left eye every 6 (six) hours. 10 mL 0   vitamin B-12 (CYANOCOBALAMIN) 500 MCG tablet Take 500 mcg by  mouth daily with lunch.     No current facility-administered medications for this visit.     Musculoskeletal: Strength & Muscle Tone: within normal limits Gait & Station: normal Patient leans: N/A  Psychiatric Specialty Exam: Review of Systems  Eyes:  Positive for visual disturbance.  Musculoskeletal:  Positive for arthralgias and back pain.  All other systems reviewed and are negative.   There were no vitals taken for this visit.There is no height or weight on file to calculate BMI.  General Appearance: Casual and Fairly Groomed  Eye Contact:  Good  Speech:  Clear and Coherent  Volume:  Normal  Mood:  Euthymic  Affect:  Appropriate and Congruent  Thought Process:  Goal Directed  Orientation:  Full (Time, Place, and Person)  Thought Content: WDL   Suicidal Thoughts:  No  Homicidal Thoughts:  No  Memory:  Immediate;   Good Recent;   Good Remote;   Good  Judgement:  Good  Insight:  Good  Psychomotor Activity:  Normal  Concentration:  Concentration: Good and Attention Span: Good  Recall:  Good  Fund of Knowledge: Good  Language: Good  Akathisia:  No  Handed:  Right  AIMS (if indicated): not done  Assets:  Communication Skills Desire for Improvement Resilience Social Support Talents/Skills  ADL's:  Intact  Cognition: WNL  Sleep:  Good   Screenings: GAD-7    Flowsheet Row Counselor from 01/13/2021 in [place] [facility]  Total GAD-7 Score 17      PHQ2-9    Flowsheet Row Video Visit from 05/24/2022 in [place] Video Visit from 03/23/2022 in [place] Video Visit from 01/19/2022 in [place] [facility] Video Visit from 11/21/2021 in [place] [facility] Video Visit from 09/20/2021 in [place] [facility]  PHQ-2 Total Score 0 0 1 2 0  PHQ-9  Total Score -- -- -- 6 --      Flowsheet Row Video Visit from 05/24/2022 in [place] [facility] Video Visit from 03/23/2022 in [place] [facility] Video Visit from 01/19/2022 in  [facility]  C-SSRS RISK CATEGORY No Risk No Risk No Risk        Assessment and Plan: Patient is a 56 year old female with a history of substance abuse in remission, depression insomnia and severe visual impairment.  She continues to think her medications are helpful for depression and anxiety.  She will continue clonazepam 0.5 mg 3 times daily as needed for anxiety, trazodone 50 mg at bedtime for sleep and Zoloft 150 mg daily for depression and anxiety.  She will return to see me in 3 months  Collaboration of Care: Collaboration of Care: Primary Care Provider AEB notes will be shared with PCP at patient's request  Patient/Guardian was advised Release of Information must be obtained prior to any record release in order to collaborate their care with an outside provider. Patient/Guardian was advised if they have not already done so to contact the registration department to sign all necessary forms in order for [place] to release information regarding their care.   Consent: Patient/Guardian gives verbal consent for treatment and assignment of benefits for services provided during this visit. Patient/Guardian expressed understanding and agreed to proceed.    [name], MD 05/24/2022, 11:27 AM

## 2022-08-10 ENCOUNTER — Inpatient Hospital Stay: Payer: BC Managed Care – PPO | Attending: Hematology

## 2022-08-10 DIAGNOSIS — D45 Polycythemia vera: Secondary | ICD-10-CM | POA: Diagnosis present

## 2022-08-10 LAB — CBC WITH DIFFERENTIAL/PLATELET
Abs Immature Granulocytes: 0.01 10*3/uL (ref 0.00–0.07)
Basophils Absolute: 0 10*3/uL (ref 0.0–0.1)
Basophils Relative: 0 %
Eosinophils Absolute: 0.1 10*3/uL (ref 0.0–0.5)
Eosinophils Relative: 1 %
HCT: 39.9 % (ref 36.0–46.0)
Hemoglobin: 13.5 g/dL (ref 12.0–15.0)
Immature Granulocytes: 0 %
Lymphocytes Relative: 33 %
Lymphs Abs: 1.6 10*3/uL (ref 0.7–4.0)
MCH: 32.1 pg (ref 26.0–34.0)
MCHC: 33.8 g/dL (ref 30.0–36.0)
MCV: 94.8 fL (ref 80.0–100.0)
Monocytes Absolute: 0.3 10*3/uL (ref 0.1–1.0)
Monocytes Relative: 6 %
Neutro Abs: 2.8 10*3/uL (ref 1.7–7.7)
Neutrophils Relative %: 60 %
Platelets: 208 10*3/uL (ref 150–400)
RBC: 4.21 MIL/uL (ref 3.87–5.11)
RDW: 13 % (ref 11.5–15.5)
WBC: 4.7 10*3/uL (ref 4.0–10.5)
nRBC: 0 % (ref 0.0–0.2)

## 2022-08-13 ENCOUNTER — Encounter (HOSPITAL_COMMUNITY): Payer: Self-pay | Admitting: Psychiatry

## 2022-08-13 ENCOUNTER — Telehealth (INDEPENDENT_AMBULATORY_CARE_PROVIDER_SITE_OTHER): Payer: BC Managed Care – PPO | Admitting: Psychiatry

## 2022-08-13 DIAGNOSIS — F5105 Insomnia due to other mental disorder: Secondary | ICD-10-CM | POA: Diagnosis not present

## 2022-08-13 DIAGNOSIS — F321 Major depressive disorder, single episode, moderate: Secondary | ICD-10-CM | POA: Diagnosis not present

## 2022-08-13 DIAGNOSIS — F418 Other specified anxiety disorders: Secondary | ICD-10-CM | POA: Diagnosis not present

## 2022-08-13 DIAGNOSIS — F429 Obsessive-compulsive disorder, unspecified: Secondary | ICD-10-CM

## 2022-08-13 MED ORDER — SERTRALINE HCL 100 MG PO TABS: ORAL_TABLET | ORAL | 3 refills | Status: DC

## 2022-08-13 MED ORDER — TRAZODONE HCL 50 MG PO TABS: 50.0000 mg | ORAL_TABLET | Freq: Every day | ORAL | 3 refills | Status: DC

## 2022-08-13 NOTE — Progress Notes (Signed)
Virtual Visit via Video Note  I connected with Klaudia Beirne Sanson on 08/13/22 at 11:20 AM EDT by a video enabled telemedicine application and verified that I am speaking with the correct person using two identifiers.  Location: Patient: Home Provider: office   I discussed the limitations of evaluation and management by telemedicine and the availability of in person appointments. The patient expressed understanding and agreed to proceed.     I discussed the assessment and treatment plan with the patient. The patient was provided an opportunity to ask questions and all were answered. The patient agreed with the plan and demonstrated an understanding of the instructions.   The patient was advised to call back or seek an in-person evaluation if the symptoms worsen or if the condition fails to improve as anticipated.  I provided 15 minutes of non-face-to-face time during this encounter.   [name], MD  [facility] MD/PA/NP OP Progress Note  08/13/2022 11:45 AM JOURNEY CASTONGUAY  MRN:  [id number]  Chief Complaint:  Chief Complaint  Patient presents with   Anxiety   Depression   Follow-up   HPI: This patient is a 56 year old widowed white female who lives alone in [place].  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  The patient returns for follow-up after almost 3 months.  She was doing really well but then got a letter from [employer].  It told her that they felt that she was being overpaid and they are going to have to garner some of the payments.  This really set her back and she is probably going to need to get a disability lawyer to handle it.  Prior to that she had been doing well and she and her parents have been spending a lot of time together despite her visual impairment.  She is now more anxious.  Also her pharmacy has not had her clonazepam in stock and she is going to have to call around other pharmacies.  She is  stressed but denies significant depression thoughts of self-harm or suicide. Visit Diagnosis:    ICD-10-CM   1. Major depressive disorder, single episode, moderate (HCC)  F32.1 sertraline (ZOLOFT) 100 MG tablet    2. Obsessive-compulsive disorder, unspecified type  F42.9 sertraline (ZOLOFT) 100 MG tablet    3. Insomnia secondary to depression with anxiety  F51.05 traZODone (DESYREL) 50 MG tablet   F41.8       Past Psychiatric History: Psychiatric hospitalization in 2013 otherwise outpatient treatment  Past Medical History:  Past Medical History:  Diagnosis Date   Abdominal pain, right upper quadrant 06/10/2018   Anxiety    Arthritis    Bursitis    DDD (degenerative disc disease)    Depression    Fibromyalgia    Obsessive-compulsive disorder    Overactive bladder    Polycythemia vera(238.4) January 2013   PTSD (post-traumatic stress disorder)    PVC's (premature ventricular contractions)    pt. placed on heart monitor x 24hours, everything fine    Sesamoiditis February 2017    Past Surgical History:  Procedure Laterality Date   ABDOMINAL HYSTERECTOMY     BIOPSY  01/04/2021   Procedure: BIOPSY;  Surgeon: [name], MD;  [facility];  Service: Gastroenterology;;  small bowel biopsy   COLONOSCOPY WITH PROPOFOL N/A 01/04/2021   Procedure: COLONOSCOPY WITH PROPOFOL;  Surgeon: [name], MD;  [facility];  Service: Gastroenterology;  Laterality:  N/A;  1115   ESOPHAGOGASTRODUODENOSCOPY N/A 09/10/2014   Procedure: ESOPHAGOGASTRODUODENOSCOPY (EGD);  Surgeon: Rogene Houston, MD;  [facility];  Service: Endoscopy;  Laterality: N/A;  130 - moved to 3:15 - Ann to notify   ESOPHAGOGASTRODUODENOSCOPY (EGD) WITH PROPOFOL N/A 01/04/2021   Procedure: ESOPHAGOGASTRODUODENOSCOPY (EGD) WITH PROPOFOL;  Surgeon: [name], MD;  [facility];  Service: Gastroenterology;  Laterality: N/A;   LUMBAR FUSION      POLYPECTOMY  01/04/2021   Procedure: POLYPECTOMY INTESTINAL;  Surgeon: [name], MD;  [facility];  Service: Gastroenterology;;  cecal lesion; ascending colon polyp;    TOOTH EXTRACTION Left Aug 2016    Family Psychiatric History: See below  Family History:  Family History  Problem Relation Age of Onset   Cancer - Colon Mother        age 52   Anxiety disorder Mother    Diabetes Mother    Atrial fibrillation Father    Lung cancer Father    Atrial fibrillation Brother    Bipolar disorder Neg Hx    Dementia Neg Hx    Drug abuse Neg Hx    Paranoid behavior Neg Hx    Schizophrenia Neg Hx    Seizures Neg Hx    Sexual abuse Neg Hx    Physical abuse Neg Hx     Social History:  Social History   Socioeconomic History   Marital status: Widowed    Spouse name: Not on file   Number of children: 0   Years of education: BA   Highest education level: Not on file  Occupational History   Occupation: [employer]- on leave (since 08/2020)  Tobacco Use   Smoking status: Every Day    Packs/day: 1.00    Years: 15.00    Total pack years: 15.00    Types: Cigarettes    Start date: 11/12/1996   Smokeless tobacco: Never   Tobacco comments:    1 pack a day x 20 yrs.  Vaping Use   Vaping Use: Never used  Substance and Sexual Activity   Alcohol use: No    Alcohol/week: 0.0 standard drinks of alcohol   Drug use: No    Types: Hydrocodone, Benzodiazepines   Sexual activity: Not on file  Other Topics Concern   Not on file  Social History Narrative   Right handed   2-3 glasses soda per day   Lives alone   Social Determinants of Health   Financial Resource Strain: Not on file  Food Insecurity: Not on file  Transportation Needs: Not on file  Physical Activity: Not on file  Stress: Not on file  Social Connections: Not on file    Allergies:  Allergies  Allergen Reactions   Gabapentin Hives   Hydrocodone-Acetaminophen Nausea Only    Penicillins Other (See Comments)    Unknown child hood reaction .Did it involve swelling of the face/tongue/throat, SOB, or low BP? Unknown Did it involve sudden or severe rash/hives, skin peeling, or any reaction on the inside of your mouth or nose? Unknown Did you need to seek medical attention at a hospital or doctor's office? Unknown When did it last happen?       If all above answers are "NO", may proceed with cephalosporin use.     Metabolic Disorder Labs: No results found for: "HGBA1C", "MPG" No results found for: "PROLACTIN" No results found for: "CHOL", "TRIG", "HDL", "CHOLHDL", "VLDL", "LDLCALC" Lab Results  Component Value  Date   TSH 1.770 10/13/2020   TSH 1.490 05/28/2016    Therapeutic Level Labs: No results found for: "LITHIUM" No results found for: "VALPROATE" No results found for: "CBMZ"  Current Medications: Current Outpatient Medications  Medication Sig Dispense Refill   acetaminophen (TYLENOL) 325 MG tablet Take 2 tablets by mouth every 4 (four) hours as needed.     aspirin 81 MG tablet Take 1 tablet (81 mg total) by mouth daily. For Polycythemia Vera.     clonazePAM (KLONOPIN) 0.5 MG tablet Take 1 tablet (0.5 mg total) by mouth 3 (three) times daily as needed for anxiety. 90 tablet 2   dicyclomine (BENTYL) 10 MG capsule Take 1 capsule (10 mg total) by mouth every 12 (twelve) hours as needed (abdominal pain). 60 capsule 2   sertraline (ZOLOFT) 100 MG tablet TAKE 1 AND 1/2 TABLETS(150 MG) BY MOUTH DAILY 45 tablet 3   traMADol (ULTRAM) 50 MG tablet Take 1 tablet (50 mg total) by mouth daily as needed for severe pain. 30 tablet 0   traZODone (DESYREL) 50 MG tablet Take 1 tablet (50 mg total) by mouth at bedtime. 30 tablet 3   trimethoprim-polymyxin b (POLYTRIM) ophthalmic solution Place 1 drop into the left eye every 6 (six) hours. 10 mL 0   vitamin B-12 (CYANOCOBALAMIN) 500 MCG tablet Take 500 mcg by mouth daily with lunch.     No current facility-administered  medications for this visit.     Musculoskeletal: Strength & Muscle Tone: within normal limits Gait & Station: normal Patient leans: N/A  Psychiatric Specialty Exam: Review of Systems visual impairment  There were no vitals taken for this visit.There is no height or weight on file to calculate BMI.  General Appearance: Casual and Fairly Groomed  Eye Contact:  Fair  Speech:  Clear and Coherent  Volume:  Normal  Mood:  Anxious  Affect:  Congruent  Thought Process:  Goal Directed  Orientation:  Full (Time, Place, and Person)  Thought Content: Rumination   Suicidal Thoughts:  No  Homicidal Thoughts:  No  Memory:  Immediate;   Good Recent;   Good Remote;   Good  Judgement:  Good  Insight:  Good  Psychomotor Activity:  Normal  Concentration:  Concentration: Fair and Attention Span: Fair  Recall:  Good  Fund of Knowledge: Good  Language: Good  Akathisia:  No  Handed:  Right  AIMS (if indicated): not done  Assets:  Communication Skills Desire for Improvement Resilience Social Support Talents/Skills  ADL's:  Intact  Cognition: WNL  Sleep:  Good   Screenings: GAD-7    Flowsheet Row Counselor from 01/13/2021 in [place] [facility]  Total GAD-7 Score 17      PHQ2-9    Flowsheet Row Video Visit from 08/13/2022 in [place] Video Visit from 05/24/2022 in [place] Video Visit from 03/23/2022 in [place] Video Visit from 01/19/2022 in [place] [facility] Video Visit from 11/21/2021 in [place] [facility]  PHQ-2 Total Score 1 0 0 1 2  PHQ-9 Total Score -- -- -- -- 6      Flowsheet Row Video Visit from 08/13/2022 in [place] [facility] Video Visit from 05/24/2022 in [place]  [facility] Video Visit from 03/23/2022 in [place] No Risk No Risk No Risk        Assessment and Plan: This  patient is a 55 year old female with a 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  For the most part her medications are helpful despite the recent stressors.  She will continue clonazepam 0.5 mg 3 times daily as needed for anxiety, trazodone 50 mg at bedtime for sleep and Zoloft 150 mg daily for depression and anxiety.  She will return to see me in 3 months  Collaboration of Care: Collaboration of Care: Primary Care Provider AEB notes will be shared with PCP at patient's request  Patient/Guardian was advised Release of Information must be obtained prior to any record release in order to collaborate their care with an outside provider. Patient/Guardian was advised if they have not already done so to contact the registration department to sign all necessary forms in order for [place] to release information regarding their care.   Consent: Patient/Guardian gives verbal consent for treatment and assignment of benefits for services provided during this visit. Patient/Guardian expressed understanding and agreed to proceed.    [name], MD 08/13/2022, 11:45 AM

## 2022-08-13 NOTE — Progress Notes (Deleted)
BH MD/PA/NP OP Progress Note  08/13/2022 11:45 AM Cheryl Cross  MRN:  [id number]  Chief Complaint:  Chief Complaint  Patient presents with   Anxiety   Depression   Follow-up   HPI: *** Visit Diagnosis:    ICD-10-CM   1. Major depressive disorder, single episode, moderate (HCC)  F32.1 sertraline (ZOLOFT) 100 MG tablet    2. Obsessive-compulsive disorder, unspecified type  F42.9 sertraline (ZOLOFT) 100 MG tablet    3. Insomnia secondary to depression with anxiety  F51.05 traZODone (DESYREL) 50 MG tablet   F41.8       Past Psychiatric History: ***  Past Medical History:  Past Medical History:  Diagnosis Date   Abdominal pain, right upper quadrant 06/10/2018   Anxiety    Arthritis    Bursitis    DDD (degenerative disc disease)    Depression    Fibromyalgia    Obsessive-compulsive disorder    Overactive bladder    Polycythemia vera(238.4) January 2013   PTSD (post-traumatic stress disorder)    PVC's (premature ventricular contractions)    pt. placed on heart monitor x 24hours, everything fine    Sesamoiditis February 2017    Past Surgical History:  Procedure Laterality Date   ABDOMINAL HYSTERECTOMY     BIOPSY  01/04/2021   Procedure: BIOPSY;  Surgeon: [name], MD;  [facility];  Service: Gastroenterology;;  small bowel biopsy   COLONOSCOPY WITH PROPOFOL N/A 01/04/2021   Procedure: COLONOSCOPY WITH PROPOFOL;  Surgeon: [name], MD;  [facility];  Service: Gastroenterology;  Laterality: N/A;  1115   ESOPHAGOGASTRODUODENOSCOPY N/A 09/10/2014   Procedure: ESOPHAGOGASTRODUODENOSCOPY (EGD);  Surgeon: Rogene Houston, MD;  [facility];  Service: Endoscopy;  Laterality: N/A;  130 - moved to 3:15 - Ann to notify   ESOPHAGOGASTRODUODENOSCOPY (EGD) WITH PROPOFOL N/A 01/04/2021   Procedure: ESOPHAGOGASTRODUODENOSCOPY (EGD) WITH PROPOFOL;  Surgeon: [name], MD;  [facility];   Service: Gastroenterology;  Laterality: N/A;   LUMBAR FUSION     POLYPECTOMY  01/04/2021   Procedure: POLYPECTOMY INTESTINAL;  Surgeon: [name], MD;  [facility];  Service: Gastroenterology;;  cecal lesion; ascending colon polyp;    TOOTH EXTRACTION Left Aug 2016    Family Psychiatric History: ***  Family History:  Family History  Problem Relation Age of Onset   Cancer - Colon Mother        age 31   Anxiety disorder Mother    Diabetes Mother    Atrial fibrillation Father    Lung cancer Father    Atrial fibrillation Brother    Bipolar disorder Neg Hx    Dementia Neg Hx    Drug abuse Neg Hx    Paranoid behavior Neg Hx    Schizophrenia Neg Hx    Seizures Neg Hx    Sexual abuse Neg Hx    Physical abuse Neg Hx     Social History:  Social History   Socioeconomic History   Marital status: Widowed    Spouse name: Not on file   Number of children: 0   Years of education: BA   Highest education level: Not on file  Occupational History   Occupation: [employer]- on leave (since 08/2020)  Tobacco Use   Smoking status: Every Day    Packs/day: 1.00    Years: 15.00    Total pack years: 15.00    Types: Cigarettes    Start date:  11/12/1996   Smokeless tobacco: Never   Tobacco comments:    1 pack a day x 20 yrs.  Vaping Use   Vaping Use: Never used  Substance and Sexual Activity   Alcohol use: No    Alcohol/week: 0.0 standard drinks of alcohol   Drug use: No    Types: Hydrocodone, Benzodiazepines   Sexual activity: Not on file  Other Topics Concern   Not on file  Social History Narrative   Right handed   2-3 glasses soda per day   Lives alone   Social Determinants of Health   Financial Resource Strain: Not on file  Food Insecurity: Not on file  Transportation Needs: Not on file  Physical Activity: Not on file  Stress: Not on file  Social Connections: Not on file    Allergies:  Allergies  Allergen Reactions    Gabapentin Hives   Hydrocodone-Acetaminophen Nausea Only   Penicillins Other (See Comments)    Unknown child hood reaction .Did it involve swelling of the face/tongue/throat, SOB, or low BP? Unknown Did it involve sudden or severe rash/hives, skin peeling, or any reaction on the inside of your mouth or nose? Unknown Did you need to seek medical attention at a hospital or doctor's office? Unknown When did it last happen?       If all above answers are "NO", may proceed with cephalosporin use.     Metabolic Disorder Labs: No results found for: "HGBA1C", "MPG" No results found for: "PROLACTIN" No results found for: "CHOL", "TRIG", "HDL", "CHOLHDL", "VLDL", "LDLCALC" Lab Results  Component Value Date   TSH 1.770 10/13/2020   TSH 1.490 05/28/2016    Therapeutic Level Labs: No results found for: "LITHIUM" No results found for: "VALPROATE" No results found for: "CBMZ"  Current Medications: Current Outpatient Medications  Medication Sig Dispense Refill   acetaminophen (TYLENOL) 325 MG tablet Take 2 tablets by mouth every 4 (four) hours as needed.     aspirin 81 MG tablet Take 1 tablet (81 mg total) by mouth daily. For Polycythemia Vera.     clonazePAM (KLONOPIN) 0.5 MG tablet Take 1 tablet (0.5 mg total) by mouth 3 (three) times daily as needed for anxiety. 90 tablet 2   dicyclomine (BENTYL) 10 MG capsule Take 1 capsule (10 mg total) by mouth every 12 (twelve) hours as needed (abdominal pain). 60 capsule 2   sertraline (ZOLOFT) 100 MG tablet TAKE 1 AND 1/2 TABLETS(150 MG) BY MOUTH DAILY 45 tablet 3   traMADol (ULTRAM) 50 MG tablet Take 1 tablet (50 mg total) by mouth daily as needed for severe pain. 30 tablet 0   traZODone (DESYREL) 50 MG tablet Take 1 tablet (50 mg total) by mouth at bedtime. 30 tablet 3   trimethoprim-polymyxin b (POLYTRIM) ophthalmic solution Place 1 drop into the left eye every 6 (six) hours. 10 mL 0   vitamin B-12 (CYANOCOBALAMIN) 500 MCG tablet Take 500 mcg by  mouth daily with lunch.     No current facility-administered medications for this visit.     Musculoskeletal: Strength & Muscle Tone: {desc; muscle tone:32375} Gait & Station: {PE GAIT ED PYPP:50932} Patient leans: {Patient Leans:21022755}  Psychiatric Specialty Exam: Review of Systems  There were no vitals taken for this visit.There is no height or weight on file to calculate BMI.  General Appearance: {Appearance:22683}  Eye Contact:  {BHH EYE CONTACT:22684}  Speech:  {Speech:22685}  Volume:  {Volume (PAA):22686}  Mood:  {BHH MOOD:22306}  Affect:  {Affect (PAA):22687}  Thought Process:  {  Thought Process (PAA):22688}  Orientation:  {BHH ORIENTATION (PAA):22689}  Thought Content: {Thought Content:22690}   Suicidal Thoughts:  {ST/HT (PAA):22692}  Homicidal Thoughts:  {ST/HT (PAA):22692}  Memory:  {BHH MEMORY:22881}  Judgement:  {Judgement (PAA):22694}  Insight:  {Insight (PAA):22695}  Psychomotor Activity:  {Psychomotor (PAA):22696}  Concentration:  {Concentration:21399}  Recall:  {BHH GOOD/FAIR/POOR:22877}  Fund of Knowledge: {BHH GOOD/FAIR/POOR:22877}  Language: {BHH GOOD/FAIR/POOR:22877}  Akathisia:  {BHH YES OR NO:22294}  Handed:  {Handed:22697}  AIMS (if indicated): {Desc; done/not:10129}  Assets:  {Assets (PAA):22698}  ADL's:  {BHH ATF'T:73220}  Cognition: {chl bhh cognition:304700322}  Sleep:  {BHH GOOD/FAIR/POOR:22877}   Screenings: GAD-7    Flowsheet Row Counselor from 01/13/2021 in [facility]  Total GAD-7 Score 17      PHQ2-9    Flowsheet Row Video Visit from 08/13/2022 in [facility] Video Visit from 05/24/2022 in [facility] Video Visit from 03/23/2022 in [facility] Video Visit from 01/19/2022 in [facility] Video Visit from 11/21/2021 in [facility]  PHQ-2 Total Score 1 0 0 1 2  PHQ-9 Total Score -- -- -- -- 6      Flowsheet Row Video Visit from 08/13/2022 in [facility] Video Visit from 05/24/2022 in [facility] Video Visit from 03/23/2022 in [facility] No Risk No Risk No Risk        Assessment and Plan: ***  Collaboration of Care: Collaboration of Care: {BH OP Collaboration of Care:21014065}  Patient/Guardian was advised Release of Information must be obtained prior to any record release in order to collaborate their care with an outside provider. Patient/Guardian was advised if they have not already done so to contact the registration department to sign all necessary forms in order for [place] to release information regarding their care.   Consent: Patient/Guardian gives verbal consent for treatment and assignment of benefits for services provided during this visit. Patient/Guardian expressed understanding and agreed to proceed.    [name], MD 08/13/2022, 11:45 AM

## 2022-08-15 ENCOUNTER — Telehealth (HOSPITAL_COMMUNITY): Payer: Self-pay | Admitting: *Deleted

## 2022-08-15 ENCOUNTER — Other Ambulatory Visit (HOSPITAL_COMMUNITY): Payer: Self-pay | Admitting: Psychiatry

## 2022-08-15 MED ORDER — LORAZEPAM 0.5 MG PO TABS: 0.5000 mg | ORAL_TABLET | Freq: Three times a day (TID) | ORAL | 2 refills | Status: DC | PRN

## 2022-08-15 NOTE — Telephone Encounter (Signed)
Tell her lorazepam sent in

## 2022-08-15 NOTE — Telephone Encounter (Signed)
Patient calling stating she have not had any luck in finding the Clonazepam anywhere. Per pt she and provider discussed in changing med to something if this happens. Patient would like for provider to please prescribe her something new that is not on backorder. Patient would like it sent to [facility] on [address].

## 2022-08-16 NOTE — Telephone Encounter (Signed)
Pt called back advised to her that Lorazepam was sent in to the pharmacy pt verbalized understanding

## 2022-08-16 NOTE — Telephone Encounter (Signed)
LMOM

## 2022-08-16 NOTE — Progress Notes (Signed)
[place] [place] [employer], [place] [address]   CLINIC:  Medical Oncology/Hematology  PCP:  [facility], [address] [place] [address] [phone]   REASON FOR VISIT:  Follow-up for polycythemia (JAK2 negative)  CURRENT THERAPY: Intermittent phlebotomies (last phlebotomy 07/06/2021)  INTERVAL HISTORY:  Cheryl Cross 56 y.o. female returns for routine follow-up of her polycythemia.  She was last seen by Tarri Abernethy PA-C on 07/31/2021.  Her last phlebotomy was on 07/06/2021.  At today's visit, she reports feeling fair.   No recent hospitalizations, surgeries, or changes in baseline health status.  She continues to have intermittent bone pain attributed to her polycythemia.  Bone pain happens randomly throughout her body," and feels separate from her chronic back pain.  She reports increased headaches recently.  Occasional hot flashes and night sweats.  Occasional erythromelalgia of her feet.  Moderate fatigue.  She denies any aquagenic pruritus, changes in finger/toe coloration, dizziness, tinnitus. She denies any B symptoms such as fever, chills, unintentional weight loss. She continues to smoke 1 pack/day cigarettes.  She has 40% energy and 50% appetite. She endorses that she is maintaining a stable weight.   REVIEW OF SYSTEMS:  Review of Systems  Constitutional:  Positive for diaphoresis and fatigue. Negative for appetite change, chills, fever and unexpected weight change.  HENT:   Negative for lump/mass and nosebleeds.   Eyes:  Negative for eye problems.  Respiratory:  Negative for cough, hemoptysis and shortness of breath.   Cardiovascular:  Negative for chest pain, leg swelling and palpitations.  Gastrointestinal:  Positive for constipation and diarrhea. Negative for abdominal pain, blood in stool, nausea and vomiting.  Endocrine: Positive for hot flashes.  Genitourinary:  Negative for hematuria.   Musculoskeletal:  Positive  for arthralgias.  Skin: Negative.   Neurological:  Positive for headaches. Negative for dizziness and light-headedness.  Hematological:  Does not bruise/bleed easily.      PAST MEDICAL/SURGICAL HISTORY:  Past Medical History:  Diagnosis Date   Abdominal pain, right upper quadrant 06/10/2018   Anxiety    Arthritis    Bursitis    DDD (degenerative disc disease)    Depression    Fibromyalgia    Obsessive-compulsive disorder    Overactive bladder    Polycythemia vera(238.4) January 2013   PTSD (post-traumatic stress disorder)    PVC's (premature ventricular contractions)    pt. placed on heart monitor x 24hours, everything fine    Sesamoiditis February 2017   Past Surgical History:  Procedure Laterality Date   ABDOMINAL HYSTERECTOMY     BIOPSY  01/04/2021   Procedure: BIOPSY;  Surgeon: [name], MD;  [facility];  Service: Gastroenterology;;  small bowel biopsy   COLONOSCOPY WITH PROPOFOL N/A 01/04/2021   Procedure: COLONOSCOPY WITH PROPOFOL;  Surgeon: [name], MD;  [facility];  Service: Gastroenterology;  Laterality: N/A;  1115   ESOPHAGOGASTRODUODENOSCOPY N/A 09/10/2014   Procedure: ESOPHAGOGASTRODUODENOSCOPY (EGD);  Surgeon: Rogene Houston, MD;  [facility];  Service: Endoscopy;  Laterality: N/A;  130 - moved to 3:15 - Ann to notify   ESOPHAGOGASTRODUODENOSCOPY (EGD) WITH PROPOFOL N/A 01/04/2021   Procedure: ESOPHAGOGASTRODUODENOSCOPY (EGD) WITH PROPOFOL;  Surgeon: [name], MD;  [facility];  Service: Gastroenterology;  Laterality: N/A;   LUMBAR FUSION     POLYPECTOMY  01/04/2021   Procedure: POLYPECTOMY INTESTINAL;  Surgeon: [name], MD;  [facility];  Service: Gastroenterology;;  cecal lesion; ascending colon polyp;    TOOTH EXTRACTION Left Aug 2016     SOCIAL HISTORY:  Social History   Socioeconomic History   Marital status: Widowed     Spouse name: Not on file   Number of children: 0   Years of education: BA   Highest education level: Not on file  Occupational History   Occupation: [employer]- on leave (since 08/2020)  Tobacco Use   Smoking status: Every Day    Packs/day: 1.00    Years: 15.00    Total pack years: 15.00    Types: Cigarettes    Start date: 11/12/1996   Smokeless tobacco: Never   Tobacco comments:    1 pack a day x 20 yrs.  Vaping Use   Vaping Use: Never used  Substance and Sexual Activity   Alcohol use: No    Alcohol/week: 0.0 standard drinks of alcohol   Drug use: No    Types: Hydrocodone, Benzodiazepines   Sexual activity: Not on file  Other Topics Concern   Not on file  Social History Narrative   Right handed   2-3 glasses soda per day   Lives alone   Social Determinants of Health   Financial Resource Strain: Not on file  Food Insecurity: Not on file  Transportation Needs: Not on file  Physical Activity: Not on file  Stress: Not on file  Social Connections: Not on file  Intimate Partner Violence: Not on file    FAMILY HISTORY:  Family History  Problem Relation Age of Onset   Cancer - Colon Mother        age 41   Anxiety disorder Mother    Diabetes Mother    Atrial fibrillation Father    Lung cancer Father    Atrial fibrillation Brother    Bipolar disorder Neg Hx    Dementia Neg Hx    Drug abuse Neg Hx    Paranoid behavior Neg Hx    Schizophrenia Neg Hx    Seizures Neg Hx    Sexual abuse Neg Hx    Physical abuse Neg Hx     CURRENT MEDICATIONS:  Outpatient Encounter Medications as of 08/17/2022  Medication Sig   acetaminophen (TYLENOL) 325 MG tablet Take 2 tablets by mouth every 4 (four) hours as needed.   aspirin 81 MG tablet Take 1 tablet (81 mg total) by mouth daily. For Polycythemia Vera.   clonazePAM (KLONOPIN) 0.5 MG tablet Take 1 tablet (0.5 mg total) by mouth 3 (three) times daily as needed for anxiety.   dicyclomine (BENTYL) 10 MG capsule  Take 1 capsule (10 mg total) by mouth every 12 (twelve) hours as needed (abdominal pain).   LORazepam (ATIVAN) 0.5 MG tablet Take 1 tablet (0.5 mg total) by mouth 3 (three) times daily as needed for anxiety.   sertraline (ZOLOFT) 100 MG tablet TAKE 1 AND 1/2 TABLETS(150 MG) BY MOUTH DAILY   traMADol (ULTRAM) 50 MG tablet Take 1 tablet (50 mg total) by mouth daily as needed for severe pain.   traZODone (DESYREL) 50 MG tablet Take 1 tablet (50 mg total) by mouth at bedtime.   trimethoprim-polymyxin b (POLYTRIM) ophthalmic solution Place 1 drop into the left eye every 6 (six) hours.   vitamin B-12 (CYANOCOBALAMIN) 500 MCG tablet Take 500 mcg by mouth daily with lunch.   No facility-administered encounter medications on file as of 08/17/2022.    ALLERGIES:  Allergies  Allergen Reactions   Gabapentin Hives  Hydrocodone-Acetaminophen Nausea Only   Penicillins Other (See Comments)    Unknown child hood reaction .Did it involve swelling of the face/tongue/throat, SOB, or low BP? Unknown Did it involve sudden or severe rash/hives, skin peeling, or any reaction on the inside of your mouth or nose? Unknown Did you need to seek medical attention at a hospital or doctor's office? Unknown When did it last happen?       If all above answers are "NO", may proceed with cephalosporin use.      PHYSICAL EXAM:  ECOG PERFORMANCE STATUS: 1 - Symptomatic but completely ambulatory  There were no vitals filed for this visit. There were no vitals filed for this visit. Physical Exam Constitutional:      Appearance: Normal appearance.  HENT:     Head: Normocephalic and atraumatic.     Mouth/Throat:     Mouth: Mucous membranes are moist.  Eyes:     Extraocular Movements: Extraocular movements intact.     Pupils: Pupils are equal, round, and reactive to light.  Cardiovascular:     Rate and Rhythm: Normal rate and regular rhythm.     Pulses: Normal pulses.     Heart sounds: Normal heart sounds.   Pulmonary:     Effort: Pulmonary effort is normal.     Breath sounds: Normal breath sounds.  Abdominal:     General: Bowel sounds are normal.     Palpations: Abdomen is soft.     Tenderness: There is no abdominal tenderness.  Musculoskeletal:        General: No swelling.     Right lower leg: No edema.     Left lower leg: No edema.  Lymphadenopathy:     Cervical: No cervical adenopathy.     Upper Body:     Right upper body: Axillary adenopathy present.     Comments: Mild right axillary lymphadenopathy, tender to palpation.  Per patient, this is intermittent, and comes and goes.  Skin:    General: Skin is warm and dry.  Neurological:     General: No focal deficit present.     Mental Status: She is alert and oriented to person, place, and time.  Psychiatric:        Mood and Affect: Mood normal.        Behavior: Behavior normal.      LABORATORY DATA:  I have reviewed the labs as listed.  CBC    Component Value Date/Time   WBC 4.7 08/10/2022 1013   RBC 4.21 08/10/2022 1013   HGB 13.5 08/10/2022 1013   HGB 12.9 06/04/2018 0920   HGB 12.8 06/05/2013 1327   HCT 39.9 08/10/2022 1013   HCT 40.2 06/04/2018 0920   HCT 38.6 06/05/2013 1327   PLT 208 08/10/2022 1013   PLT 317 06/04/2018 0920   MCV 94.8 08/10/2022 1013   MCV 93 06/04/2018 0920   MCV 89.6 06/05/2013 1327   MCH 32.1 08/10/2022 1013   MCHC 33.8 08/10/2022 1013   RDW 13.0 08/10/2022 1013   RDW 14.4 06/04/2018 0920   RDW 14.4 06/05/2013 1327   LYMPHSABS 1.6 08/10/2022 1013   LYMPHSABS 1.8 06/04/2018 0920   LYMPHSABS 2.3 06/05/2013 1327   MONOABS 0.3 08/10/2022 1013   MONOABS 0.6 06/05/2013 1327   EOSABS 0.1 08/10/2022 1013   EOSABS 0.2 06/04/2018 0920   BASOSABS 0.0 08/10/2022 1013   BASOSABS 0.0 06/04/2018 0920   BASOSABS 0.1 06/05/2013 1327      Latest Ref Rng & Units 04/04/2022  10:47 AM 11/20/2021    1:10 PM 10/18/2021    8:34 AM  CMP  Glucose 70 - 99 mg/dL 86  93  96   BUN 6 - 20 mg/dL _0 Creatinine 0.44 - 1.00 mg/dL 0.72  0.71  0.72   Sodium 135 - 145 mmol/L 141  138  138   Potassium 3.5 - 5.1 mmol/L 3.8  3.6  3.0   Chloride 98 - 111 mmol/L 109  104  102   CO2 22 - 32 mmol/L _1 Calcium 8.9 - 10.3 mg/dL 8.8  8.6  8.7   Total Protein 6.5 - 8.1 g/dL 6.6  6.6  6.7   Total Bilirubin 0.3 - 1.2 mg/dL 0.5  0.2  0.3   Alkaline Phos 38 - 126 U/L 62  69  81   AST 15 - 41 U/L _2 ALT 0 - 44 U/L _3 DIAGNOSTIC IMAGING:  I have independently reviewed the relevant imaging and discussed with the patient.  ASSESSMENT & PLAN: 1.  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  No phlebotomy since August 2022, since blood counts have been within normal limits.   - Continues to take aspirin 81 mg daily - Takes tramadol about 3 times per week for bone pains related to polycythemia - Most recent phlebotomy 07/06/2021.  Blood counts within the normal range for the past year without intervention. - Most recent labs (08/10/2022): Normal CBC with Hgb 13.5 and HCT 39.9.  CMP and LDH were unremarkable when checked in May 2023. - PLAN: Continue aspirin 81 mg daily with tramadol 3 times per week for joint pains from polycythemia. - Would consider bone marrow biopsy to reevaluate, since she has been maintaining normal blood counts without intervention.  Would certainly recommend bone marrow biopsy if she developed any cytopenias. - Repeat CBC every 3 months with possible phlebotomy (if HCT >  42) - RTC in 6 months with repeat labs  2.  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  Tobacco use - Current everyday smoker, 1 PPD - PLAN: Discussed referring patient for [place] lung cancer screening program, but she does not want to do this yet.  Reports that she "has too much going on," as she is helping to care for her father with stage IV lung cancer.  We will discuss again at her next appointment     PLAN SUMMARY & DISPOSITION: Labs and possible phlebotomy in 3 months (HCT >42.0) Labs in 6 months Office visit and possible phlebotomy in 6 months, 1 week after labs  All questions were answered. The patient knows to call the clinic with any problems, questions or concerns.  Medical decision making: Moderate  Time  spent on visit: I spent 20 minutes counseling the patient face to face. The total time spent in the appointment was 30 minutes and more than 50% was on counseling.   Cheryl Rush, PA-C  08/17/2022 10:46 AM

## 2022-08-17 ENCOUNTER — Inpatient Hospital Stay: Payer: BC Managed Care – PPO

## 2022-08-17 ENCOUNTER — Inpatient Hospital Stay: Payer: BC Managed Care – PPO | Attending: Physician Assistant | Admitting: Physician Assistant

## 2022-08-17 VITALS — BP 125/80 | HR 73 | Temp 98.8°F | Resp 16 | Wt 116.6 lb

## 2022-08-17 DIAGNOSIS — D45 Polycythemia vera: Secondary | ICD-10-CM | POA: Insufficient documentation

## 2022-08-17 DIAGNOSIS — G893 Neoplasm related pain (acute) (chronic): Secondary | ICD-10-CM | POA: Diagnosis not present

## 2022-08-17 DIAGNOSIS — F17210 Nicotine dependence, cigarettes, uncomplicated: Secondary | ICD-10-CM | POA: Diagnosis not present

## 2022-08-17 DIAGNOSIS — Z79899 Other long term (current) drug therapy: Secondary | ICD-10-CM | POA: Insufficient documentation

## 2022-08-17 MED ORDER — TRAMADOL HCL 50 MG PO TABS: 50.0000 mg | ORAL_TABLET | Freq: Every day | ORAL | 0 refills | Status: DC | PRN

## 2022-08-17 NOTE — Patient Instructions (Signed)
[facility] **   You were seen today by Tarri Abernethy PA-C for your follow-up visit.    POLYCYTHEMIA VERA Your blood counts are currently within normal range. You do not need any phlebotomy at this time. We will recheck labs in 3 months and schedule you for possible phlebotomy (if hematocrit is greater than 42)  BONE PAIN Continue tramadol as needed for bone pain related to your polycythemia.  FOLLOW-UP APPOINTMENT: Full lab panel and office visit in 6 months  ** Thank you for trusting me with your healthcare!  I strive to provide all of my patients with quality care at each visit.  If you receive a survey for this visit, I would be so grateful to you for taking the time to provide feedback.  Thank you in advance!  ~ Charisa Twitty                   Dr. Derek Jack   &   Tarri Abernethy, PA-C   - - - - - - - - - - - - - - - - - -    Thank you for choosing [facility] to provide your oncology and hematology care.  To afford each patient quality time with our provider, please arrive at least 15 minutes before your scheduled appointment time.   If you have a lab appointment with the [facility] please come in thru the Main Entrance and check in at the main information desk.  You need to re-schedule your appointment should you arrive 10 or more minutes late.  We strive to give you quality time with our providers, and arriving late affects you and other patients whose appointments are after yours.  Also, if you no show three or more times for appointments you may be dismissed from the clinic at the providers discretion.     Again, thank you for choosing [facility].  Our hope is that these requests will decrease the amount of time that you wait before being seen by our physicians.       _____________________________________________________________  Should  you have questions after your visit to [facility], please contact our office at [phone] and follow the prompts.  Our office hours are 8:00 a.m. and 4:30 p.m. Monday - Friday.  Please note that voicemails left after 4:00 p.m. may not be returned until the following business day.  We are closed weekends and major holidays.  You do have access to a nurse 24-7, just call the main number to the clinic [phone] and do not press any options, hold on the line and a nurse will answer the phone.    For prescription refill requests, have your pharmacy contact our office and allow 72 hours.

## 2022-08-17 NOTE — Progress Notes (Signed)
No Phlebotomy today per order parameters. 

## 2022-10-15 ENCOUNTER — Other Ambulatory Visit (HOSPITAL_COMMUNITY): Payer: Self-pay | Admitting: Psychiatry

## 2022-10-15 ENCOUNTER — Telehealth (INDEPENDENT_AMBULATORY_CARE_PROVIDER_SITE_OTHER): Payer: BC Managed Care – PPO | Admitting: Psychiatry

## 2022-10-15 ENCOUNTER — Encounter (HOSPITAL_COMMUNITY): Payer: Self-pay | Admitting: Psychiatry

## 2022-10-15 ENCOUNTER — Telehealth (HOSPITAL_COMMUNITY): Payer: Self-pay | Admitting: *Deleted

## 2022-10-15 DIAGNOSIS — F5105 Insomnia due to other mental disorder: Secondary | ICD-10-CM | POA: Diagnosis not present

## 2022-10-15 DIAGNOSIS — F418 Other specified anxiety disorders: Secondary | ICD-10-CM | POA: Diagnosis not present

## 2022-10-15 DIAGNOSIS — F429 Obsessive-compulsive disorder, unspecified: Secondary | ICD-10-CM | POA: Diagnosis not present

## 2022-10-15 DIAGNOSIS — F321 Major depressive disorder, single episode, moderate: Secondary | ICD-10-CM

## 2022-10-15 MED ORDER — TRAZODONE HCL 50 MG PO TABS: 50.0000 mg | ORAL_TABLET | Freq: Every day | ORAL | 3 refills | Status: DC

## 2022-10-15 MED ORDER — LORAZEPAM 0.5 MG PO TABS: 0.5000 mg | ORAL_TABLET | Freq: Three times a day (TID) | ORAL | 2 refills | Status: DC | PRN

## 2022-10-15 MED ORDER — CLONAZEPAM 0.5 MG PO TABS: 0.5000 mg | ORAL_TABLET | Freq: Three times a day (TID) | ORAL | 2 refills | Status: DC | PRN

## 2022-10-15 MED ORDER — SERTRALINE HCL 100 MG PO TABS: ORAL_TABLET | ORAL | 3 refills | Status: DC

## 2022-10-15 NOTE — Progress Notes (Signed)
Virtual Visit via Video Note  I connected with Cheryl Cross on 10/15/22 at 11:00 AM EST by a video enabled telemedicine application and verified that I am speaking with the correct person using two identifiers.  Location: Patient: home Provider: office   I discussed the limitations of evaluation and management by telemedicine and the availability of in person appointments. The patient expressed understanding and agreed to proceed.     I discussed the assessment and treatment plan with the patient. The patient was provided an opportunity to ask questions and all were answered. The patient agreed with the plan and demonstrated an understanding of the instructions.   The patient was advised to call back or seek an in-person evaluation if the symptoms worsen or if the condition fails to improve as anticipated.  I provided 15 minutes of non-face-to-face time during this encounter.   Cheryl Spiller, MD  [facility] MD/PA/NP OP Progress Note  10/15/2022 11:24 AM [name]  MRN:  [id number]  Chief Complaint:  Chief Complaint  Patient presents with   Depression   Anxiety   Follow-up   HPI: This patient is a 56 year old widowed white female who lives alone in [place].  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  The patient returns for follow-up after 2 months.  Last time she was upset because a letter from [employer] claim that she had been overpaid.  She is sent letters back with documentation and is hoping for the best.  Her vision is not getting any better and it tends to wax and wane.  The ophthalmologist at [facility] do not have any thing more to offer to ameliorate the situation.  She has been enjoying spending time with family.  She denies significant depression anxiety or thoughts of self-harm.  Her pharmacy apparently still could not get her the clonazepam and she is making do with the Ativan although it  makes her feel "blunted."  I asked her to call around because other patients have been able to get clonazepam. Visit Diagnosis:    ICD-10-CM   1. Major depressive disorder, single episode, moderate (HCC)  F32.1 sertraline (ZOLOFT) 100 MG tablet    2. Obsessive-compulsive disorder, unspecified type  F42.9 sertraline (ZOLOFT) 100 MG tablet    3. Insomnia secondary to depression with anxiety  F51.05 traZODone (DESYREL) 50 MG tablet   F41.8       Past Psychiatric History: Psychiatric hospitalization in 2013 otherwise outpatient treatment  Past Medical History:  Past Medical History:  Diagnosis Date   Abdominal pain, right upper quadrant 06/10/2018   Anxiety    Arthritis    Bursitis    DDD (degenerative disc disease)    Depression    Fibromyalgia    Obsessive-compulsive disorder    Overactive bladder    Polycythemia vera(238.4) January 2013   PTSD (post-traumatic stress disorder)    PVC's (premature ventricular contractions)    pt. placed on heart monitor x 24hours, everything fine    Sesamoiditis February 2017    Past Surgical History:  Procedure Laterality Date   ABDOMINAL HYSTERECTOMY     BIOPSY  01/04/2021   Procedure: BIOPSY;  Surgeon: [name], MD;  [facility];  Service: Gastroenterology;;  small bowel biopsy   COLONOSCOPY WITH PROPOFOL N/A 01/04/2021   Procedure: COLONOSCOPY WITH PROPOFOL;  Surgeon: [name], MD;  [facility];  Service: Gastroenterology;  Laterality: N/A;  1115   ESOPHAGOGASTRODUODENOSCOPY N/A 09/10/2014   Procedure: ESOPHAGOGASTRODUODENOSCOPY (EGD);  Surgeon: Rogene Houston, MD;  [facility];  Service: Endoscopy;  Laterality: N/A;  130 - moved to 3:15 - Ann to notify   ESOPHAGOGASTRODUODENOSCOPY (EGD) WITH PROPOFOL N/A 01/04/2021   Procedure: ESOPHAGOGASTRODUODENOSCOPY (EGD) WITH PROPOFOL;  Surgeon: [name], MD;  [facility];  Service: Gastroenterology;   Laterality: N/A;   LUMBAR FUSION     POLYPECTOMY  01/04/2021   Procedure: POLYPECTOMY INTESTINAL;  Surgeon: [name], MD;  [facility];  Service: Gastroenterology;;  cecal lesion; ascending colon polyp;    TOOTH EXTRACTION Left Aug 2016    Family Psychiatric History: See below  Family History:  Family History  Problem Relation Age of Onset   Cancer - Colon Mother        age 59   Anxiety disorder Mother    Diabetes Mother    Atrial fibrillation Father    Lung cancer Father    Atrial fibrillation Brother    Bipolar disorder Neg Hx    Dementia Neg Hx    Drug abuse Neg Hx    Paranoid behavior Neg Hx    Schizophrenia Neg Hx    Seizures Neg Hx    Sexual abuse Neg Hx    Physical abuse Neg Hx     Social History:  Social History   Socioeconomic History   Marital status: Widowed    Spouse name: Not on file   Number of children: 0   Years of education: BA   Highest education level: Not on file  Occupational History   Occupation: [employer]- on leave (since 08/2020)  Tobacco Use   Smoking status: Every Day    Packs/day: 1.00    Years: 15.00    Total pack years: 15.00    Types: Cigarettes    Start date: 11/12/1996   Smokeless tobacco: Never   Tobacco comments:    1 pack a day x 20 yrs.  Vaping Use   Vaping Use: Never used  Substance and Sexual Activity   Alcohol use: No    Alcohol/week: 0.0 standard drinks of alcohol   Drug use: No    Types: Hydrocodone, Benzodiazepines   Sexual activity: Not on file  Other Topics Concern   Not on file  Social History Narrative   Right handed   2-3 glasses soda per day   Lives alone   Social Determinants of Health   Financial Resource Strain: Not on file  Food Insecurity: Not on file  Transportation Needs: Not on file  Physical Activity: Not on file  Stress: Not on file  Social Connections: Not on file    Allergies:  Allergies  Allergen Reactions   Gabapentin Hives    Hydrocodone-Acetaminophen Nausea Only   Penicillins Other (See Comments)    Unknown child hood reaction .Did it involve swelling of the face/tongue/throat, SOB, or low BP? Unknown Did it involve sudden or severe rash/hives, skin peeling, or any reaction on the inside of your mouth or nose? Unknown Did you need to seek medical attention at a hospital or doctor's office? Unknown When did it last happen?       If all above answers are "NO", may proceed with cephalosporin use.     Metabolic Disorder Labs: No results found for: "HGBA1C", "MPG" No results found for: "PROLACTIN" No results found for: "CHOL", "TRIG", "HDL", "CHOLHDL", "VLDL", "LDLCALC" Lab Results  Component Value Date  TSH 1.770 10/13/2020   TSH 1.490 05/28/2016    Therapeutic Level Labs: No results found for: "LITHIUM" No results found for: "VALPROATE" No results found for: "CBMZ"  Current Medications: Current Outpatient Medications  Medication Sig Dispense Refill   acetaminophen (TYLENOL) 325 MG tablet Take 2 tablets by mouth every 4 (four) hours as needed.     aspirin 81 MG tablet Take 1 tablet (81 mg total) by mouth daily. For Polycythemia Vera.     dicyclomine (BENTYL) 10 MG capsule Take 1 capsule (10 mg total) by mouth every 12 (twelve) hours as needed (abdominal pain). 60 capsule 2   LORazepam (ATIVAN) 0.5 MG tablet Take 1 tablet (0.5 mg total) by mouth 3 (three) times daily as needed for anxiety. 90 tablet 2   sertraline (ZOLOFT) 100 MG tablet TAKE 1 AND 1/2 TABLETS(150 MG) BY MOUTH DAILY 45 tablet 3   traMADol (ULTRAM) 50 MG tablet Take 1 tablet (50 mg total) by mouth daily as needed for severe pain. 30 tablet 0   traZODone (DESYREL) 50 MG tablet Take 1 tablet (50 mg total) by mouth at bedtime. 30 tablet 3   trimethoprim-polymyxin b (POLYTRIM) ophthalmic solution Place 1 drop into the left eye every 6 (six) hours. 10 mL 0   vitamin B-12 (CYANOCOBALAMIN) 500 MCG tablet Take 500 mcg by mouth daily with lunch.      No current facility-administered medications for this visit.     Musculoskeletal: Strength & Muscle Tone: within normal limits Gait & Station: normal Patient leans: N/A  Psychiatric Specialty Exam: Review of Systems  Eyes:  Positive for visual disturbance.  Musculoskeletal:  Positive for arthralgias and neck pain.  All other systems reviewed and are negative.   There were no vitals taken for this visit.There is no height or weight on file to calculate BMI.  General Appearance: Casual and Fairly Groomed  Eye Contact:  Good  Speech:  Clear and Coherent  Volume:  Normal  Mood:  Euthymic  Affect:  Congruent  Thought Process:  Goal Directed  Orientation:  Full (Time, Place, and Person)  Thought Content: WDL   Suicidal Thoughts:  No  Homicidal Thoughts:  No  Memory:  Immediate;   Good Recent;   Good Remote;   Good  Judgement:  Good  Insight:  Good  Psychomotor Activity:  Decreased  Concentration:  Concentration: Fair and Attention Span: Fair  Recall:  Good  Fund of Knowledge: Good  Language: Good  Akathisia:  No  Handed:  Right  AIMS (if indicated): not done  Assets:  Communication Skills Desire for Improvement Resilience Social Support Talents/Skills  ADL's:  Intact  Cognition: WNL  Sleep:  Good   Screenings: GAD-7    Flowsheet Row Counselor from 01/13/2021 in [facility]  Total GAD-7 Score 17      PHQ2-9    Flowsheet Row Video Visit from 08/13/2022 in [facility] Video Visit from 05/24/2022 in [facility] Video Visit from 03/23/2022 in [facility] Video Visit from 01/19/2022 in [facility] Video Visit from 11/21/2021 in [facility]  PHQ-2 Total Score 1 0 0 1 2  PHQ-9 Total Score -- -- -- -- 6       Flowsheet Row Video Visit from 08/13/2022 in [facility] Video Visit from 05/24/2022 in [facility] Video Visit from 03/23/2022 in [facility]  C-SSRS RISK CATEGORY No Risk No Risk No Risk        Assessment and Plan: This patient is a 56 year old female with a 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  She does feel that the medications are helpful despite the recent stressors.  She is still going to call around to see where she can get the clonazepam but for now she will continue lorazepam 0.5 mg 3 times daily as needed for anxiety, trazodone 50 mg at bedtime for sleep and Zoloft 150 mg daily for depression and anxiety.  She will return to see me in 3 months  Collaboration of Care: Collaboration of Care: Primary Care Provider AEB notes will be shared with PCP at patient's request  Patient/Guardian was advised Release of Information must be obtained prior to any record release in order to collaborate their care with an outside provider. Patient/Guardian was advised if they have not already done so to contact the registration department to sign all necessary forms in order for [place] to release information regarding their care.   Consent: Patient/Guardian gives verbal consent for treatment and assignment of benefits for services provided during this visit. Patient/Guardian expressed understanding and agreed to proceed.    Cheryl Spiller, MD 10/15/2022, 11:24 AM

## 2022-10-15 NOTE — Telephone Encounter (Signed)
Clonazepam is in stock at her pharmacy and would like to go back on it. Per patient her pharmacy [facility] on [address] have it in stock now.

## 2022-10-16 ENCOUNTER — Institutional Professional Consult (permissible substitution): Payer: BC Managed Care – PPO | Admitting: Neurology

## 2022-11-16 ENCOUNTER — Inpatient Hospital Stay: Payer: BC Managed Care – PPO | Attending: Physician Assistant

## 2022-11-16 ENCOUNTER — Inpatient Hospital Stay: Payer: BC Managed Care – PPO

## 2022-11-16 DIAGNOSIS — D45 Polycythemia vera: Secondary | ICD-10-CM | POA: Diagnosis present

## 2022-11-16 LAB — CBC WITH DIFFERENTIAL/PLATELET
Abs Immature Granulocytes: 0 10*3/uL (ref 0.00–0.07)
Basophils Absolute: 0 10*3/uL (ref 0.0–0.1)
Basophils Relative: 0 %
Eosinophils Absolute: 0 10*3/uL (ref 0.0–0.5)
Eosinophils Relative: 1 %
HCT: 39.9 % (ref 36.0–46.0)
Hemoglobin: 13.3 g/dL (ref 12.0–15.0)
Immature Granulocytes: 0 %
Lymphocytes Relative: 38 %
Lymphs Abs: 1.8 10*3/uL (ref 0.7–4.0)
MCH: 32.3 pg (ref 26.0–34.0)
MCHC: 33.3 g/dL (ref 30.0–36.0)
MCV: 96.8 fL (ref 80.0–100.0)
Monocytes Absolute: 0.3 10*3/uL (ref 0.1–1.0)
Monocytes Relative: 7 %
Neutro Abs: 2.6 10*3/uL (ref 1.7–7.7)
Neutrophils Relative %: 54 %
Platelets: 184 10*3/uL (ref 150–400)
RBC: 4.12 MIL/uL (ref 3.87–5.11)
RDW: 13.1 % (ref 11.5–15.5)
WBC: 4.8 10*3/uL (ref 4.0–10.5)
nRBC: 0 % (ref 0.0–0.2)

## 2022-11-16 NOTE — Progress Notes (Signed)
No phlebotomy needed today per parameters.   

## 2022-11-26 ENCOUNTER — Other Ambulatory Visit (HOSPITAL_COMMUNITY): Payer: Self-pay | Admitting: Psychiatry

## 2022-11-26 DIAGNOSIS — F418 Other specified anxiety disorders: Secondary | ICD-10-CM

## 2022-12-24 ENCOUNTER — Other Ambulatory Visit (HOSPITAL_COMMUNITY): Payer: Self-pay | Admitting: Psychiatry

## 2022-12-24 DIAGNOSIS — F429 Obsessive-compulsive disorder, unspecified: Secondary | ICD-10-CM

## 2022-12-24 DIAGNOSIS — F321 Major depressive disorder, single episode, moderate: Secondary | ICD-10-CM

## 2022-12-31 ENCOUNTER — Inpatient Hospital Stay: Payer: BC Managed Care – PPO | Attending: Physician Assistant

## 2022-12-31 DIAGNOSIS — D45 Polycythemia vera: Secondary | ICD-10-CM

## 2022-12-31 LAB — CBC WITH DIFFERENTIAL/PLATELET
Abs Immature Granulocytes: 0.02 10*3/uL (ref 0.00–0.07)
Basophils Absolute: 0 10*3/uL (ref 0.0–0.1)
Basophils Relative: 1 %
Eosinophils Absolute: 0.1 10*3/uL (ref 0.0–0.5)
Eosinophils Relative: 1 %
HCT: 42.5 % (ref 36.0–46.0)
Hemoglobin: 14.2 g/dL (ref 12.0–15.0)
Immature Granulocytes: 0 %
Lymphocytes Relative: 33 %
Lymphs Abs: 2.1 10*3/uL (ref 0.7–4.0)
MCH: 32.1 pg (ref 26.0–34.0)
MCHC: 33.4 g/dL (ref 30.0–36.0)
MCV: 95.9 fL (ref 80.0–100.0)
Monocytes Absolute: 0.4 10*3/uL (ref 0.1–1.0)
Monocytes Relative: 7 %
Neutro Abs: 3.8 10*3/uL (ref 1.7–7.7)
Neutrophils Relative %: 58 %
Platelets: 207 10*3/uL (ref 150–400)
RBC: 4.43 MIL/uL (ref 3.87–5.11)
RDW: 12.2 % (ref 11.5–15.5)
WBC: 6.4 10*3/uL (ref 4.0–10.5)
nRBC: 0 % (ref 0.0–0.2)

## 2022-12-31 LAB — COMPREHENSIVE METABOLIC PANEL
ALT: 12 U/L (ref 0–44)
AST: 15 U/L (ref 15–41)
Albumin: 4.2 g/dL (ref 3.5–5.0)
Alkaline Phosphatase: 73 U/L (ref 38–126)
Anion gap: 6 (ref 5–15)
BUN: 5 mg/dL — ABNORMAL LOW (ref 6–20)
CO2: 29 mmol/L (ref 22–32)
Calcium: 8.6 mg/dL — ABNORMAL LOW (ref 8.9–10.3)
Chloride: 100 mmol/L (ref 98–111)
Creatinine, Ser: 0.7 mg/dL (ref 0.44–1.00)
GFR, Estimated: >60 mL/min (ref >60)
Glucose, Bld: 80 mg/dL (ref 70–99)
Potassium: 3.7 mmol/L (ref 3.5–5.1)
Sodium: 135 mmol/L (ref 135–145)
Total Bilirubin: 0.6 mg/dL (ref 0.3–1.2)
Total Protein: 6.9 g/dL (ref 6.5–8.1)

## 2022-12-31 LAB — LACTATE DEHYDROGENASE: LDH: 112 U/L (ref 98–192)

## 2023-01-14 ENCOUNTER — Encounter (HOSPITAL_COMMUNITY): Payer: Self-pay | Admitting: Psychiatry

## 2023-01-14 ENCOUNTER — Telehealth (INDEPENDENT_AMBULATORY_CARE_PROVIDER_SITE_OTHER): Payer: BC Managed Care – PPO | Admitting: Psychiatry

## 2023-01-14 DIAGNOSIS — F321 Major depressive disorder, single episode, moderate: Secondary | ICD-10-CM

## 2023-01-14 DIAGNOSIS — F429 Obsessive-compulsive disorder, unspecified: Secondary | ICD-10-CM

## 2023-01-14 DIAGNOSIS — F5105 Insomnia due to other mental disorder: Secondary | ICD-10-CM

## 2023-01-14 DIAGNOSIS — F418 Other specified anxiety disorders: Secondary | ICD-10-CM

## 2023-01-14 MED ORDER — BUPROPION HCL ER (XL) 150 MG PO TB24: 150.0000 mg | ORAL_TABLET | ORAL | 2 refills | Status: DC

## 2023-01-14 MED ORDER — CLONAZEPAM 0.5 MG PO TABS: 0.5000 mg | ORAL_TABLET | Freq: Three times a day (TID) | ORAL | 2 refills | Status: DC | PRN

## 2023-01-14 MED ORDER — TRAZODONE HCL 50 MG PO TABS: ORAL_TABLET | ORAL | 3 refills | Status: DC

## 2023-01-14 MED ORDER — SERTRALINE HCL 100 MG PO TABS: ORAL_TABLET | ORAL | 3 refills | Status: DC

## 2023-01-14 NOTE — Progress Notes (Addendum)
Virtual Visit via Video Note  I connected with Cheryl Cross on 01/15/23 at 11:00 AM EST by a video enabled telemedicine application and verified that I am speaking with the correct person using two identifiers.  Location: Patient: home Provider: office   I discussed the limitations of evaluation and management by telemedicine and the availability of in person appointments. The patient expressed understanding and agreed to proceed.    I discussed the assessment and treatment plan with the patient. The patient was provided an opportunity to ask questions and all were answered. The patient agreed with the plan and demonstrated an understanding of the instructions.   The patient was advised to call back or seek an in-person evaluation if the symptoms worsen or if the condition fails to improve as anticipated.  I provided 20 minutes of non-face-to-face time during this encounter.   [name], MD  [facility] MD/PA/NP OP Progress Note  01/14/2023 11:28 AM Cheryl Cross  MRN:  [id number]  Chief Complaint:  Chief Complaint  Patient presents with   Anxiety   Depression   Follow-up   HPI: Patient is a 57 year old widowed white female who lives alone in [place].  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  She states that lately she has been feeling empty and not having much energy or motivation.  She is struggling with her father's declining health and going to a lot of appointments with him.  She is no longer able to drive on her own because her vision is getting worse.  She is having a lot more trouble with reading.  She has to except the fact that she is going to lose some independence and has to learn to care for herself without much vision.  She denies any thoughts of self-harm or suicide.  Her appetite is not the best and she is trying to do better with this.  She is getting  fairly good sleep and a lot does not use the trazodone much.  At times she has panic attacks with the Klonopin helps.  I asked if she thought the Zoloft was causing her to feel "blunted" but she states it is really just been the last couple of months.  I suggested addition of Wellbutrin to help with her energy and she would like to try it. Visit Diagnosis:    ICD-10-CM   1. Major depressive disorder, single episode, moderate (HCC)  F32.1 sertraline (ZOLOFT) 100 MG tablet    2. Obsessive-compulsive disorder, unspecified type  F42.9 sertraline (ZOLOFT) 100 MG tablet    3. Insomnia secondary to depression with anxiety  F51.05 traZODone (DESYREL) 50 MG tablet   F41.8       Past Psychiatric History: Psychiatric hospitalization in 2013 otherwise outpatient treatment  Past Medical History:  Past Medical History:  Diagnosis Date   Abdominal pain, right upper quadrant 06/10/2018   Anxiety    Arthritis    Bursitis    DDD (degenerative disc disease)    Depression    Fibromyalgia    Obsessive-compulsive disorder    Overactive bladder    Polycythemia vera(238.4) January 2013   PTSD (post-traumatic stress disorder)    PVC's (premature ventricular contractions)    pt. placed on heart monitor x 24hours, everything fine    Sesamoiditis February 2017    Past Surgical History:  Procedure Laterality Date   ABDOMINAL HYSTERECTOMY  BIOPSY  01/04/2021   Procedure: BIOPSY;  Surgeon: Marguerita Merles, Reuel Boom, MD;  [facility];  Service: Gastroenterology;;  small bowel biopsy   COLONOSCOPY WITH PROPOFOL N/A 01/04/2021   Procedure: COLONOSCOPY WITH PROPOFOL;  Surgeon: Dolores Frame, MD;  [facility];  Service: Gastroenterology;  Laterality: N/A;  1115   ESOPHAGOGASTRODUODENOSCOPY N/A 09/10/2014   Procedure: ESOPHAGOGASTRODUODENOSCOPY (EGD);  Surgeon: Malissa Hippo, MD;  [facility];  Service: Endoscopy;  Laterality: N/A;  130 - moved to 3:15 - Ann to  notify   ESOPHAGOGASTRODUODENOSCOPY (EGD) WITH PROPOFOL N/A 01/04/2021   Procedure: ESOPHAGOGASTRODUODENOSCOPY (EGD) WITH PROPOFOL;  Surgeon: Dolores Frame, MD;  [facility];  Service: Gastroenterology;  Laterality: N/A;   LUMBAR FUSION     POLYPECTOMY  01/04/2021   Procedure: POLYPECTOMY INTESTINAL;  Surgeon: Dolores Frame, MD;  [facility];  Service: Gastroenterology;;  cecal lesion; ascending colon polyp;    TOOTH EXTRACTION Left Aug 2016    Family Psychiatric History: See below  Family History:  Family History  Problem Relation Age of Onset   Cancer - Colon Mother        age 21   Anxiety disorder Mother    Diabetes Mother    Atrial fibrillation Father    Lung cancer Father    Atrial fibrillation Brother    Bipolar disorder Neg Hx    Dementia Neg Hx    Drug abuse Neg Hx    Paranoid behavior Neg Hx    Schizophrenia Neg Hx    Seizures Neg Hx    Sexual abuse Neg Hx    Physical abuse Neg Hx     Social History:  Social History   Socioeconomic History   Marital status: Widowed    Spouse name: Not on file   Number of children: 0   Years of education: BA   Highest education level: Not on file  Occupational History   Occupation: [employer]- on leave (since 08/2020)  Tobacco Use   Smoking status: Every Day    Packs/day: 1.00    Years: 15.00    Total pack years: 15.00    Types: Cigarettes    Start date: 11/12/1996   Smokeless tobacco: Never   Tobacco comments:    1 pack a day x 20 yrs.  Vaping Use   Vaping Use: Never used  Substance and Sexual Activity   Alcohol use: No    Alcohol/week: 0.0 standard drinks of alcohol   Drug use: No    Types: Hydrocodone, Benzodiazepines   Sexual activity: Not on file  Other Topics Concern   Not on file  Social History Narrative   Right handed   2-3 glasses soda per day   Lives alone   Social Determinants of Health   Financial Resource Strain: Not on file   Food Insecurity: Not on file  Transportation Needs: Not on file  Physical Activity: Not on file  Stress: Not on file  Social Connections: Not on file    Allergies:  Allergies  Allergen Reactions   Gabapentin Hives   Hydrocodone-Acetaminophen Nausea Only   Penicillins Other (See Comments)    Unknown child hood reaction .Did it involve swelling of the face/tongue/throat, SOB, or low BP? Unknown Did it involve sudden or severe rash/hives, skin peeling, or any reaction on the inside of your mouth or nose? Unknown Did you need to seek medical attention at a hospital or doctor's office? Unknown When  did it last happen?       If all above answers are "NO", may proceed with cephalosporin use.     Metabolic Disorder Labs: No results found for: "HGBA1C", "MPG" No results found for: "PROLACTIN" No results found for: "CHOL", "TRIG", "HDL", "CHOLHDL", "VLDL", "LDLCALC" Lab Results  Component Value Date   TSH 1.770 10/13/2020   TSH 1.490 05/28/2016    Therapeutic Level Labs: No results found for: "LITHIUM" No results found for: "VALPROATE" No results found for: "CBMZ"  Current Medications: Current Outpatient Medications  Medication Sig Dispense Refill   buPROPion (WELLBUTRIN XL) 150 MG 24 hr tablet Take 1 tablet (150 mg total) by mouth every morning. 30 tablet 2   acetaminophen (TYLENOL) 325 MG tablet Take 2 tablets by mouth every 4 (four) hours as needed.     aspirin 81 MG tablet Take 1 tablet (81 mg total) by mouth daily. For Polycythemia Vera.     clonazePAM (KLONOPIN) 0.5 MG tablet Take 1 tablet (0.5 mg total) by mouth 3 (three) times daily as needed for anxiety. 90 tablet 2   dicyclomine (BENTYL) 10 MG capsule Take 1 capsule (10 mg total) by mouth every 12 (twelve) hours as needed (abdominal pain). 60 capsule 2   sertraline (ZOLOFT) 100 MG tablet TAKE 1 AND 1/2 TABLETS(150 MG) BY MOUTH DAILY 45 tablet 3   traMADol (ULTRAM) 50 MG tablet Take 1 tablet (50 mg total) by mouth daily  as needed for severe pain. 30 tablet 0   traZODone (DESYREL) 50 MG tablet TAKE 1 TABLET(50 MG) BY MOUTH AT BEDTIME 30 tablet 3   trimethoprim-polymyxin b (POLYTRIM) ophthalmic solution Place 1 drop into the left eye every 6 (six) hours. 10 mL 0   vitamin B-12 (CYANOCOBALAMIN) 500 MCG tablet Take 500 mcg by mouth daily with lunch.     No current facility-administered medications for this visit.     Musculoskeletal: Strength & Muscle Tone: within normal limits Gait & Station: normal Patient leans: N/A  Psychiatric Specialty Exam: Review of Systems  Constitutional:  Positive for fatigue.  Eyes:  Positive for visual disturbance.  Musculoskeletal:  Positive for myalgias.  Neurological:  Positive for headaches.  Psychiatric/Behavioral:  Positive for dysphoric mood.   All other systems reviewed and are negative.   There were no vitals taken for this visit.There is no height or weight on file to calculate BMI.  General Appearance: Casual and Fairly Groomed  Eye Contact:  Fair  Speech:  Clear and Coherent  Volume:  Normal  Mood:  Dysphoric  Affect:  Flat  Thought Process:  Goal Directed  Orientation:  Full (Time, Place, and Person)  Thought Content: Rumination   Suicidal Thoughts:  No  Homicidal Thoughts:  No  Memory:  Immediate;   Good Recent;   Good Remote;   Good  Judgement:  Good  Insight:  Good  Psychomotor Activity:  Decreased  Concentration:  Concentration: Good and Attention Span: Good  Recall:  Good  Fund of Knowledge: Good  Language: Good  Akathisia:  No  Handed:  Right  AIMS (if indicated): not done  Assets:  Communication Skills Desire for Improvement Resilience Social Support  ADL's:  Intact  Cognition: WNL  Sleep:  Good   Screenings: GAD-7    Advertising copywriter from 01/13/2021 in [facility] [place]  Total GAD-7 Score 17      PHQ2-9    Flowsheet Row Video Visit from 01/14/2023 in [facility]  at [facility] Video Visit from 08/13/2022 in [facility] at [place] Video Visit from 05/24/2022 in [facility] at [place] Video Visit from 03/23/2022 in [facility] at [place] Video Visit from 01/19/2022 in [facility] at [facility] Total Score 2 1 0 0 1  PHQ-9 Total Score 6 -- -- -- --      Flowsheet Row Video Visit from 01/14/2023 in [facility] at [place] Video Visit from 08/13/2022 in [facility] at [place] Video Visit from 05/24/2022 in [facility] at [place]  C-SSRS RISK CATEGORY No Risk No Risk No Risk        Assessment and Plan: This patient is a 57 year old female with a history of substance abuse in remission depression insomnia and severe visual impairment she does feel like her medicines are helping to some degree but lately has been more depressed and blunted due to the stressors she is dealing with.  I suggested counseling but she declined at this time.  Will add Wellbutrin XL to her current regimen which includes lorazepam 0.5 mg 3 times daily as needed for anxiety, trazodone 50 mg at bedtime for sleep and Zoloft 150 mg daily for depression and anxiety.  She will return to see me in 6 weeks  Collaboration of Care: Collaboration of Care: Primary Care Provider AEB notes will be shared with PCP at patient's request  Patient/Guardian was advised Release of Information must be obtained prior to any record release in order to collaborate their care with an outside provider. Patient/Guardian was advised if they have not already done so to contact the registration department to sign all necessary forms in order for [place] to release information regarding their care.   Consent: Patient/Guardian gives verbal consent for treatment and assignment of benefits for  services provided during this visit. Patient/Guardian expressed understanding and agreed to proceed.    [name], MD 01/14/2023, 11:28 AM

## 2023-01-15 ENCOUNTER — Telehealth: Payer: Self-pay | Admitting: *Deleted

## 2023-01-15 NOTE — Telephone Encounter (Signed)
Patient called to state that she is symptomatic with fatigue and leg aches.  Would like to have phlebotomy prior to April's scheduled appointment.  Per Daleen Bo PA-C, we will call and schedule her for earlier treatment.

## 2023-01-22 ENCOUNTER — Telehealth (HOSPITAL_COMMUNITY): Payer: Self-pay

## 2023-01-22 NOTE — Telephone Encounter (Signed)
Yes, she can stop it

## 2023-01-22 NOTE — Telephone Encounter (Signed)
Spoke with pt advised of Dr Ross's message pt verbalized understanding 

## 2023-01-22 NOTE — Telephone Encounter (Signed)
Pt calling in stating that since she has started taking  buPROPion (WELLBUTRIN XL) 150 MG 24 hr tablet she has been feeling really depressed in the late afternoons/evening time. States that she has not felt this way before, wanting to know if it is ok to stop the medication. Please advise pt last seen  01/14/23.

## 2023-01-23 ENCOUNTER — Inpatient Hospital Stay: Payer: BC Managed Care – PPO | Attending: Physician Assistant

## 2023-01-23 VITALS — BP 100/56 | HR 66 | Temp 98.9°F | Resp 18

## 2023-01-23 DIAGNOSIS — R110 Nausea: Secondary | ICD-10-CM

## 2023-01-23 DIAGNOSIS — D45 Polycythemia vera: Secondary | ICD-10-CM | POA: Diagnosis present

## 2023-01-23 MED ORDER — PROCHLORPERAZINE MALEATE 10 MG PO TABS
5.0000 mg | ORAL_TABLET | Freq: Once | ORAL | Status: AC
  Administered 2023-01-23: 5 mg via ORAL

## 2023-01-23 NOTE — Progress Notes (Signed)
Vita Barley Siddique presents today for phlebotomy per MD orders. Phlebotomy procedure started at 1440 and ended at 1450. 500 cc removed. Patient had syncope episode at end of phlebotomy, BP stable 128/87, she said she ate and drank before, no dizziness or lightheadedness, reports some nausea. Patient given coke and crackers. Patient states she still feels nauseous, Tarri Abernethy, [place] made aware, per PA give '5mg'$  of compazine  Patient monitored for 1 hour post phlebotomy, patient states she feels better, just tired. Patient discharged in satisfactory condition  via wheelchair with mother as transportation home.

## 2023-01-23 NOTE — Patient Instructions (Signed)
[facility]  Discharge Instructions: Thank you for choosing [facility] to provide your oncology and hematology care.  If you have a lab appointment with the Cancer Center, please come in thru the Main Entrance and check in at the main information desk.  Wear comfortable clothing and clothing appropriate for easy access to any Portacath or PICC line.   We strive to give you quality time with your provider. You may need to reschedule your appointment if you arrive late (15 or more minutes).  Arriving late affects you and other patients whose appointments are after yours.  Also, if you miss three or more appointments without notifying the office, you may be dismissed from the clinic at the provider's discretion.      For prescription refill requests, have your pharmacy contact our office and allow 72 hours for refills to be completed.    Today you received the following phlebotomy, return as scheduled.   To help prevent nausea and vomiting after your treatment, we encourage you to take your nausea medication as directed.  BELOW ARE SYMPTOMS THAT SHOULD BE REPORTED IMMEDIATELY: *FEVER GREATER THAN 100.4 F (38 C) OR HIGHER *CHILLS OR SWEATING *NAUSEA AND VOMITING THAT IS NOT CONTROLLED WITH YOUR NAUSEA MEDICATION *UNUSUAL SHORTNESS OF BREATH *UNUSUAL BRUISING OR BLEEDING *URINARY PROBLEMS (pain or burning when urinating, or frequent urination) *BOWEL PROBLEMS (unusual diarrhea, constipation, pain near the anus) TENDERNESS IN MOUTH AND THROAT WITH OR WITHOUT PRESENCE OF ULCERS (sore throat, sores in mouth, or a toothache) UNUSUAL RASH, SWELLING OR PAIN  UNUSUAL VAGINAL DISCHARGE OR ITCHING   Items with * indicate a potential emergency and should be followed up as soon as possible or go to the Emergency Department if any problems should occur.  Please show the CHEMOTHERAPY ALERT CARD or IMMUNOTHERAPY ALERT CARD at check-in to the Emergency Department and  triage nurse.  Should you have questions after your visit or need to cancel or reschedule your appointment, please contact [facility] [phone]  and follow the prompts.  Office hours are 8:00 a.m. to 4:30 p.m. Monday - Friday. Please note that voicemails left after 4:00 p.m. may not be returned until the following business day.  We are closed weekends and major holidays. You have access to a nurse at all times for urgent questions. Please call the main number to the clinic [phone] and follow the prompts.  For any non-urgent questions, you may also contact your provider using MyChart. We now offer e-Visits for anyone 18 and older to request care online for non-urgent symptoms. For details visit [email or web address].   Also download the MyChart app! Go to the app store, search "MyChart", open the app, select [facility], and log in with your MyChart username and password.   

## 2023-02-05 ENCOUNTER — Other Ambulatory Visit (HOSPITAL_COMMUNITY): Payer: Self-pay | Admitting: Obstetrics & Gynecology

## 2023-02-05 DIAGNOSIS — Z1231 Encounter for screening mammogram for malignant neoplasm of breast: Secondary | ICD-10-CM

## 2023-02-11 ENCOUNTER — Other Ambulatory Visit: Payer: Self-pay | Admitting: *Deleted

## 2023-02-11 ENCOUNTER — Other Ambulatory Visit (HOSPITAL_COMMUNITY)
Admission: RE | Admit: 2023-02-11 | Discharge: 2023-02-11 | Disposition: A | Discharge status: Home / Self Care | Payer: BC Managed Care – PPO | Source: Ambulatory Visit | Attending: Internal Medicine | Admitting: Internal Medicine

## 2023-02-11 ENCOUNTER — Other Ambulatory Visit (HOSPITAL_COMMUNITY): Payer: Self-pay | Admitting: Internal Medicine

## 2023-02-11 DIAGNOSIS — R1011 Right upper quadrant pain: Secondary | ICD-10-CM

## 2023-02-11 DIAGNOSIS — K219 Gastro-esophageal reflux disease without esophagitis: Secondary | ICD-10-CM | POA: Insufficient documentation

## 2023-02-11 DIAGNOSIS — D45 Polycythemia vera: Secondary | ICD-10-CM

## 2023-02-11 LAB — COMPREHENSIVE METABOLIC PANEL
ALT: 16 U/L (ref 0–44)
AST: 14 U/L — ABNORMAL LOW (ref 15–41)
Albumin: 4.1 g/dL (ref 3.5–5.0)
Alkaline Phosphatase: 64 U/L (ref 38–126)
Anion gap: 8 (ref 5–15)
BUN: 6 mg/dL (ref 6–20)
CO2: 27 mmol/L (ref 22–32)
Calcium: 8.7 mg/dL — ABNORMAL LOW (ref 8.9–10.3)
Chloride: 102 mmol/L (ref 98–111)
Creatinine, Ser: 0.8 mg/dL (ref 0.44–1.00)
GFR, Estimated: >60 mL/min (ref >60)
Glucose, Bld: 87 mg/dL (ref 70–99)
Potassium: 3.9 mmol/L (ref 3.5–5.1)
Sodium: 137 mmol/L (ref 135–145)
Total Bilirubin: 0.6 mg/dL (ref 0.3–1.2)
Total Protein: 6.9 g/dL (ref 6.5–8.1)

## 2023-02-11 LAB — CBC WITH DIFFERENTIAL/PLATELET
Abs Immature Granulocytes: 0.01 10*3/uL (ref 0.00–0.07)
Basophils Absolute: 0 10*3/uL (ref 0.0–0.1)
Basophils Relative: 0 %
Eosinophils Absolute: 0.1 10*3/uL (ref 0.0–0.5)
Eosinophils Relative: 2 %
HCT: 39.2 % (ref 36.0–46.0)
Hemoglobin: 13.1 g/dL (ref 12.0–15.0)
Immature Granulocytes: 0 %
Lymphocytes Relative: 32 %
Lymphs Abs: 1.8 10*3/uL (ref 0.7–4.0)
MCH: 31.8 pg (ref 26.0–34.0)
MCHC: 33.4 g/dL (ref 30.0–36.0)
MCV: 95.1 fL (ref 80.0–100.0)
Monocytes Absolute: 0.5 10*3/uL (ref 0.1–1.0)
Monocytes Relative: 9 %
Neutro Abs: 3.2 10*3/uL (ref 1.7–7.7)
Neutrophils Relative %: 57 %
Platelets: 195 10*3/uL (ref 150–400)
RBC: 4.12 MIL/uL (ref 3.87–5.11)
RDW: 12.6 % (ref 11.5–15.5)
WBC: 5.6 10*3/uL (ref 4.0–10.5)
nRBC: 0 % (ref 0.0–0.2)

## 2023-02-11 LAB — LIPASE, BLOOD: Lipase: 45 U/L (ref 11–51)

## 2023-02-11 LAB — LACTATE DEHYDROGENASE: LDH: 114 U/L (ref 98–192)

## 2023-02-12 LAB — AMYLASE: Amylase: 57 U/L (ref 28–100)

## 2023-02-13 ENCOUNTER — Ambulatory Visit (HOSPITAL_COMMUNITY)
Admission: RE | Admit: 2023-02-13 | Discharge: 2023-02-13 | Disposition: A | Discharge status: Home / Self Care | Payer: BC Managed Care – PPO | Source: Ambulatory Visit | Attending: Internal Medicine | Admitting: Internal Medicine

## 2023-02-13 ENCOUNTER — Other Ambulatory Visit (HOSPITAL_COMMUNITY): Payer: Self-pay | Admitting: Internal Medicine

## 2023-02-13 DIAGNOSIS — R1011 Right upper quadrant pain: Secondary | ICD-10-CM | POA: Diagnosis present

## 2023-02-15 ENCOUNTER — Inpatient Hospital Stay: Payer: BC Managed Care – PPO

## 2023-02-19 ENCOUNTER — Encounter (HOSPITAL_COMMUNITY)
Admission: RE | Admit: 2023-02-19 | Discharge: 2023-02-19 | Disposition: A | Discharge status: Home / Self Care | Payer: BC Managed Care – PPO | Source: Ambulatory Visit | Attending: Internal Medicine | Admitting: Internal Medicine

## 2023-02-19 ENCOUNTER — Other Ambulatory Visit (HOSPITAL_COMMUNITY): Payer: Self-pay | Admitting: Internal Medicine

## 2023-02-19 DIAGNOSIS — R1011 Right upper quadrant pain: Secondary | ICD-10-CM

## 2023-02-20 NOTE — Progress Notes (Unsigned)
[facility] [address] [place], [place] [address]   CLINIC:  Medical Oncology/Hematology  PCP:  Cheryl NevinsFusco, [place], MD [address] [place] [address] [phone]   REASON FOR VISIT:  Follow-up for polycythemia (JAK2 negative)   CURRENT THERAPY: Intermittent phlebotomies (last phlebotomy 01/23/2023)  INTERVAL HISTORY:   [name] 57 y.o. female returns for routine follow-up of her polycythemia.  She was last seen by Cheryl Brennerebekah Loney Domingo PA-C on 08/17/2022.    Patient had called in March 2024 to request phlebotomy due to fatigue and leg aches.  Labs from 12/31/2022 had showed hematocrit 42.5%, and she reports increased symptoms when hematocrit is > 42%.  However, she had a syncopal episode following phlebotomy on 01/23/2023.  She reports feeling less leg pain after phlebotomy.  She continues to have intermittent bone pain that she attributes to her polycythemia.  Bone pain happens randomly throughout her body" primarily arms and legs, which feels separate from her chronic back pain.  She reports headaches, occasional erythromelalgia of her feet, and moderate fatigue.   She denies any aquagenic pruritus, changes in finger/toe coloration, dizziness, tinnitus. She denies any B symptoms such as fever, chills, unintentional weight loss.  She continues to smoke 1 pack/day cigarettes.  She reports that she has been referred to GI for workup of nausea, poor appetite, and RUQ abdominal pain.  She has been losing weight due to poor appetite and decreased tolerance of food.  This is associated with intermittent chills.   She has 70% energy and 40% appetite.  ASSESSMENT & PLAN:  1.  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  No phlebotomy since August 2022, since blood counts have been within normal limits. - She requested phlebotomy on 01/23/2023 due to hematocrit 42.5%, reports that she has worsening symptoms when hematocrit >42.0.  She had syncopal episode after phlebotomy on 01/23/2023.   - Continues to take aspirin 81 mg daily - Takes tramadol about 3 times per week for bone pains related to polycythemia - Most recent labs (02/11/2023): Normal CBC with Hgb 13.1 and HCT 39.2.  CMP and LDH unremarkable. - PLAN: Continue aspirin 81 mg daily with tramadol 3 times per week for joint pains from polycythemia. - Would consider bone marrow biopsy to reevaluate if she developed any cytopenias. - Repeat CBC every 3 months with possible phlebotomy (if HCT > 42) - RTC in 6 months with repeat labs   2.  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  She has been losing weight due to poor appetite and decreased tolerance of food.   - PLAN: Continue to monitor at follow-up appointments  3.  Tobacco use - Current everyday smoker, 1 PPD - PLAN: Discussed referring patient for CT lung cancer screening program, but she wants to make sure cost is covered by her insurance first.  We  will look into this and schedule in the future once confirmation has been received.   PLAN SUMMARY: >> CBC + POSSIBLE phlebotomy in 3 months >> Labs in 6 months = CBC/D, CMP, LDH >> OFFICE visit + POSSIBLE phlebotomy in 6 months     REVIEW OF SYSTEMS:   Review of Systems  Constitutional:  Positive  for chills and fatigue. Negative for appetite change, diaphoresis, fever and unexpected weight change.  HENT:   Negative for lump/mass and nosebleeds.   Eyes:  Negative for eye problems.  Respiratory:  Negative for cough, hemoptysis and shortness of breath.   Cardiovascular:  Negative for chest pain, leg swelling and palpitations.  Gastrointestinal:  Positive for abdominal pain and nausea. Negative for blood in stool, constipation, diarrhea and vomiting.  Genitourinary:  Negative for hematuria.   Skin: Negative.   Neurological:  Positive for headaches and numbness. Negative for dizziness and light-headedness.  Hematological:  Does not bruise/bleed easily.  Psychiatric/Behavioral:  Positive for depression. The patient is nervous/anxious.      PHYSICAL EXAM:  ECOG PERFORMANCE STATUS: 1 - Symptomatic but completely ambulatory  There were no vitals filed for this visit. There were no vitals filed for this visit. Physical Exam Constitutional:      Appearance: Normal appearance. She is normal weight.  Cardiovascular:     Heart sounds: Normal heart sounds.  Pulmonary:     Breath sounds: Normal breath sounds.  Neurological:     General: No focal deficit present.     Mental Status: Mental status is at baseline.  Psychiatric:        Behavior: Behavior normal. Behavior is cooperative.     PAST MEDICAL/SURGICAL HISTORY:  Past Medical History:  Diagnosis Date   Abdominal pain, right upper quadrant 06/10/2018   Anxiety    Arthritis    Bursitis    DDD (degenerative disc disease)    Depression    Fibromyalgia    Obsessive-compulsive disorder    Overactive bladder    Polycythemia vera(238.4) January 2013   PTSD (post-traumatic stress disorder)    PVC's (premature ventricular contractions)    pt. placed on heart monitor x 24hours, everything fine    Sesamoiditis February 2017   Past Surgical History:  Procedure Laterality Date   ABDOMINAL HYSTERECTOMY     BIOPSY  01/04/2021    Procedure: BIOPSY;  Surgeon: Dolores Frame, MD;  [facility];  Service: Gastroenterology;;  small bowel biopsy   COLONOSCOPY WITH PROPOFOL N/A 01/04/2021   Procedure: COLONOSCOPY WITH PROPOFOL;  Surgeon: Dolores Frame, MD;  [facility];  Service: Gastroenterology;  Laterality: N/A;  1115   ESOPHAGOGASTRODUODENOSCOPY N/A 09/10/2014   Procedure: ESOPHAGOGASTRODUODENOSCOPY (EGD);  Surgeon: Malissa Hippo, MD;  [facility]: [facility];  Service: Endoscopy;  Laterality: N/A;  130 - moved to 3:15 - Ann to notify   ESOPHAGOGASTRODUODENOSCOPY (EGD) WITH PROPOFOL N/A 01/04/2021   Procedure: ESOPHAGOGASTRODUODENOSCOPY (EGD) WITH PROPOFOL;  Surgeon: Dolores Frame, MD;  [facility];  Service: Gastroenterology;  Laterality: N/A;   LUMBAR FUSION     POLYPECTOMY  01/04/2021   Procedure: POLYPECTOMY INTESTINAL;  Surgeon: Dolores Frame, MD;  [facility]: [facility];  Service: Gastroenterology;;  cecal lesion; ascending colon polyp;    TOOTH EXTRACTION Left Aug 2016    SOCIAL HISTORY:  Social History   Socioeconomic History   Marital status: Widowed    Spouse name: Not on file   Number of children: 0   Years  of education: BA   Highest education level: Not on file  Occupational History   Occupation: [employer]- on leave (since 08/2020)  Tobacco Use   Smoking status: Every Day    Packs/day: 1.00    Years: 15.00    Additional pack years: 0.00    Total pack years: 15.00    Types: Cigarettes    Start date: 11/12/1996   Smokeless tobacco: Never   Tobacco comments:    1 pack a day x 20 yrs.  Vaping Use   Vaping Use: Never used  Substance and Sexual Activity   Alcohol use: No    Alcohol/week: 0.0 standard drinks of alcohol   Drug use: No    Types: Hydrocodone, Benzodiazepines   Sexual activity: Not on file  Other Topics Concern   Not on file  Social History Narrative   Right handed   2-3 glasses  soda per day   Lives alone   Social Determinants of Health   Financial Resource Strain: Not on file  Food Insecurity: Not on file  Transportation Needs: Not on file  Physical Activity: Not on file  Stress: Not on file  Social Connections: Not on file  Intimate Partner Violence: Not on file    FAMILY HISTORY:  Family History  Problem Relation Age of Onset   Cancer - Colon Mother        age 27   Anxiety disorder Mother    Diabetes Mother    Atrial fibrillation Father    Lung cancer Father    Atrial fibrillation Brother    Bipolar disorder Neg Hx    Dementia Neg Hx    Drug abuse Neg Hx    Paranoid behavior Neg Hx    Schizophrenia Neg Hx    Seizures Neg Hx    Sexual abuse Neg Hx    Physical abuse Neg Hx     CURRENT MEDICATIONS:  Outpatient Encounter Medications as of 02/21/2023  Medication Sig   acetaminophen (TYLENOL) 325 MG tablet Take 2 tablets by mouth every 4 (four) hours as needed.   aspirin 81 MG tablet Take 1 tablet (81 mg total) by mouth daily. For Polycythemia Vera.   buPROPion (WELLBUTRIN XL) 150 MG 24 hr tablet Take 1 tablet (150 mg total) by mouth every morning.   clonazePAM (KLONOPIN) 0.5 MG tablet Take 1 tablet (0.5 mg total) by mouth 3 (three) times daily as needed for anxiety.   dicyclomine (BENTYL) 10 MG capsule Take 1 capsule (10 mg total) by mouth every 12 (twelve) hours as needed (abdominal pain).   sertraline (ZOLOFT) 100 MG tablet TAKE 1 AND 1/2 TABLETS(150 MG) BY MOUTH DAILY   traMADol (ULTRAM) 50 MG tablet Take 1 tablet (50 mg total) by mouth daily as needed for severe pain.   traZODone (DESYREL) 50 MG tablet TAKE 1 TABLET(50 MG) BY MOUTH AT BEDTIME   trimethoprim-polymyxin b (POLYTRIM) ophthalmic solution Place 1 drop into the left eye every 6 (six) hours.   vitamin B-12 (CYANOCOBALAMIN) 500 MCG tablet Take 500 mcg by mouth daily with lunch.   No facility-administered encounter medications on file as of 02/21/2023.    ALLERGIES:  Allergies   Allergen Reactions   Gabapentin Hives   Hydrocodone-Acetaminophen Nausea Only   Penicillins Other (See Comments)    Unknown child hood reaction .Did it involve swelling of the face/tongue/throat, SOB, or low BP? Unknown Did it involve sudden or severe rash/hives, skin peeling, or any reaction on the inside of your mouth  or nose? Unknown Did you need to seek medical attention at a hospital or doctor's office? Unknown When did it last happen?       If all above answers are "NO", may proceed with cephalosporin use.     LABORATORY DATA:  I have reviewed the labs as listed.  CBC    Component Value Date/Time   WBC 5.6 02/11/2023 0825   RBC 4.12 02/11/2023 0825   HGB 13.1 02/11/2023 0825   HGB 12.9 06/04/2018 0920   HGB 12.8 06/05/2013 1327   HCT 39.2 02/11/2023 0825   HCT 40.2 06/04/2018 0920   HCT 38.6 06/05/2013 1327   PLT 195 02/11/2023 0825   PLT 317 06/04/2018 0920   MCV 95.1 02/11/2023 0825   MCV 93 06/04/2018 0920   MCV 89.6 06/05/2013 1327   MCH 31.8 02/11/2023 0825   MCHC 33.4 02/11/2023 0825   RDW 12.6 02/11/2023 0825   RDW 14.4 06/04/2018 0920   RDW 14.4 06/05/2013 1327   LYMPHSABS 1.8 02/11/2023 0825   LYMPHSABS 1.8 06/04/2018 0920   LYMPHSABS 2.3 06/05/2013 1327   MONOABS 0.5 02/11/2023 0825   MONOABS 0.6 06/05/2013 1327   EOSABS 0.1 02/11/2023 0825   EOSABS 0.2 06/04/2018 0920   BASOSABS 0.0 02/11/2023 0825   BASOSABS 0.0 06/04/2018 0920   BASOSABS 0.1 06/05/2013 1327      Latest Ref Rng & Units 02/11/2023    8:25 AM 12/31/2022   12:42 PM 04/04/2022   10:47 AM  CMP  Glucose 70 - 99 mg/dL 87  80  86   BUN 6 - 20 mg/dL 6  5  5    Creatinine 0.44 - 1.00 mg/dL 0.98  1.19  1.47   Sodium 135 - 145 mmol/L 137  135  141   Potassium 3.5 - 5.1 mmol/L 3.9  3.7  3.8   Chloride 98 - 111 mmol/L 102  100  109   CO2 22 - 32 mmol/L 27  29  30    Calcium 8.9 - 10.3 mg/dL 8.7  8.6  8.8   Total Protein 6.5 - 8.1 g/dL 6.9  6.9  6.6   Total Bilirubin 0.3 - 1.2 mg/dL 0.6   0.6  0.5   Alkaline Phos 38 - 126 U/L 64  73  62   AST 15 - 41 U/L 14  15  15    ALT 0 - 44 U/L 16  12  15      DIAGNOSTIC IMAGING:  I have independently reviewed the relevant imaging and discussed with the patient.   WRAP UP:  All questions were answered. The patient knows to call the clinic with any problems, questions or concerns.  Medical decision making: Moderate  Time spent on visit: I spent 20 minutes counseling the patient face to face. The total time spent in the appointment was 30 minutes and more than 50% was on counseling.  Carnella Guadalajara, PA-C  02/21/2023 6:58 PM

## 2023-02-21 ENCOUNTER — Inpatient Hospital Stay: Payer: BC Managed Care – PPO | Attending: Physician Assistant | Admitting: Physician Assistant

## 2023-02-21 ENCOUNTER — Inpatient Hospital Stay: Payer: BC Managed Care – PPO

## 2023-02-21 VITALS — BP 124/73 | HR 69 | Temp 99.2°F | Resp 17

## 2023-02-21 DIAGNOSIS — G893 Neoplasm related pain (acute) (chronic): Secondary | ICD-10-CM

## 2023-02-21 DIAGNOSIS — F17210 Nicotine dependence, cigarettes, uncomplicated: Secondary | ICD-10-CM | POA: Diagnosis not present

## 2023-02-21 DIAGNOSIS — D45 Polycythemia vera: Secondary | ICD-10-CM

## 2023-02-21 DIAGNOSIS — Z79899 Other long term (current) drug therapy: Secondary | ICD-10-CM | POA: Diagnosis not present

## 2023-02-21 DIAGNOSIS — R634 Abnormal weight loss: Secondary | ICD-10-CM | POA: Diagnosis not present

## 2023-02-21 DIAGNOSIS — Z7982 Long term (current) use of aspirin: Secondary | ICD-10-CM | POA: Diagnosis not present

## 2023-02-21 DIAGNOSIS — M898X9 Other specified disorders of bone, unspecified site: Secondary | ICD-10-CM | POA: Insufficient documentation

## 2023-02-21 MED ORDER — TRAMADOL HCL 50 MG PO TABS
50.0000 mg | ORAL_TABLET | Freq: Every day | ORAL | 0 refills | Status: DC | PRN
Start: 2023-02-21 — End: 2024-09-30

## 2023-02-21 NOTE — Patient Instructions (Addendum)
[facility] **VISIT SUMMARY & IMPORTANT INSTRUCTIONS **   You were seen today by Rojelio Brenner PA-C for your follow-up visit.    POLYCYTHEMIA VERA Your blood counts are currently within normal range. You do not need any phlebotomy at this time. We will recheck labs in 3 months and schedule you for possible phlebotomy (if hematocrit is greater than 42)  BONE PAIN Continue tramadol as needed for bone pain related to your polycythemia.  TOBACCO USE:  You are at an increased risk of lung cancer due to your smoking habit.  We will check with your insurance company to see if they would cover your CT screening for lung cancer risk.  Please see the attached handout for information on smoking cessation.  FOLLOW-UP APPOINTMENT: Full lab panel and office visit in 6 months  ** Thank you for trusting me with your healthcare!  I strive to provide all of my patients with quality care at each visit.  If you receive a survey for this visit, I would be so grateful to you for taking the time to provide feedback.  Thank you in advance!  ~ Naasia Weilbacher                   Dr. Doreatha Massed   &   Rojelio Brenner, PA-C   - - - - - - - - - - - - - - - - - -    Thank you for choosing [facility] at [facility] to provide your oncology and hematology care.  To afford each patient quality time with our provider, please arrive at least 15 minutes before your scheduled appointment time.   If you have a lab appointment with the Cancer Center please come in thru the Main Entrance and check in at the main information desk.  You need to re-schedule your appointment should you arrive 10 or more minutes late.  We strive to give you quality time with our providers, and arriving late affects you and other patients whose appointments are after yours.  Also, if you no show three or more times for appointments you may be dismissed from the clinic at the providers  discretion.     Again, thank you for choosing [facility].  Our hope is that these requests will decrease the amount of time that you wait before being seen by our physicians.       _____________________________________________________________  Should you have questions after your visit to [facility], please contact our office at [phone] and follow the prompts.  Our office hours are 8:00 a.m. and 4:30 p.m. Monday - Friday.  Please note that voicemails left after 4:00 p.m. may not be returned until the following business day.  We are closed weekends and major holidays.  You do have access to a nurse 24-7, just call the main number to the clinic [phone] and do not press any options, hold on the line and a nurse will answer the phone.    For prescription refill requests, have your pharmacy contact our office and allow 72 hours.

## 2023-02-22 ENCOUNTER — Encounter: Payer: Self-pay | Admitting: Physician Assistant

## 2023-02-22 ENCOUNTER — Encounter (INDEPENDENT_AMBULATORY_CARE_PROVIDER_SITE_OTHER): Payer: Self-pay

## 2023-02-22 ENCOUNTER — Encounter (INDEPENDENT_AMBULATORY_CARE_PROVIDER_SITE_OTHER): Payer: Self-pay | Admitting: *Deleted

## 2023-02-26 ENCOUNTER — Encounter (INDEPENDENT_AMBULATORY_CARE_PROVIDER_SITE_OTHER): Payer: Self-pay | Admitting: Gastroenterology

## 2023-02-26 ENCOUNTER — Telehealth (HOSPITAL_COMMUNITY): Payer: BC Managed Care – PPO | Admitting: Psychiatry

## 2023-02-26 ENCOUNTER — Ambulatory Visit (INDEPENDENT_AMBULATORY_CARE_PROVIDER_SITE_OTHER): Payer: BC Managed Care – PPO | Admitting: Gastroenterology

## 2023-02-26 ENCOUNTER — Telehealth (INDEPENDENT_AMBULATORY_CARE_PROVIDER_SITE_OTHER): Payer: Self-pay | Admitting: Gastroenterology

## 2023-02-26 VITALS — BP 139/81 | HR 75 | Temp 98.0°F | Ht 67.0 in | Wt 111.3 lb

## 2023-02-26 DIAGNOSIS — R112 Nausea with vomiting, unspecified: Secondary | ICD-10-CM

## 2023-02-26 DIAGNOSIS — R1013 Epigastric pain: Secondary | ICD-10-CM

## 2023-02-26 DIAGNOSIS — K580 Irritable bowel syndrome with diarrhea: Secondary | ICD-10-CM | POA: Diagnosis not present

## 2023-02-26 DIAGNOSIS — R110 Nausea: Secondary | ICD-10-CM

## 2023-02-26 DIAGNOSIS — R634 Abnormal weight loss: Secondary | ICD-10-CM | POA: Diagnosis not present

## 2023-02-26 HISTORY — DX: Nausea with vomiting, unspecified: R11.2

## 2023-02-26 NOTE — Progress Notes (Signed)
Referring Provider: Elfredia Nevins, MD Primary Care Physician:  Elfredia Nevins, MD Primary GI Physician: Levon Hedger   Chief Complaint  Patient presents with   Abdominal Pain    Having upper abdominal pain on both sides around rib cage and nausea. Pcp ordered ultrasound of gallbladder and  hida scan. Pt reports it was normal.    HPI:   Cheryl Cross is a 57 y.o. female with past medical history of  IBS-M, anxiety, depression, fibromyalgia, OCD, PTSD   Patient presenting today for abdominal pain and nausea   Last seen 12/2020, at that time reported for  atleast 10 years she has presented RUQ pain intermittently. Has the discomfort 3-4 times a week, which lasts for a few minutes but she states that she does not take anything for the pain as it usually resolves on its own. She describes the pain as stabbing and does not radiate anywhere else. She also reports a feeling of persistent fullness through the day in her upper abdomen. It initially affected her appetite but now she has been eating better than in the past. having some bloating.  On the other hand, patient states that she used to have urgency and episodes of diarrhea for multiple years for which she was seen in the GI clinic 3 years ago, but 6 months ago she noticed a change in her bowel movement frequency. Now she is having a bowel movement every other day, has to strain significantly to have a BM sometimes; however, she does not take any laxatives.  Notably, she is taking tramadol once a week for back pain. underwent a HIDA scan in 05/09/2020 which was normal.  Also had a complete US abdomen on 04/20/20 which showed a small GB polyp but no other alteration.  Recommended to schedule EGD/Colonoscopy, start bentyl q12h PRN, TTG IgA, start miralax  EGD/Colonoscopy as below, celiac testing was negative.  Recent US abdomen and HIDA scan in early April 2024 with GB polyp of 5.62mm, normal GB EF  Present:  Patient reports epigastric to  right upper quadrant abdominal pain and constant nausea. Reports [place] and HIDA scan were both normal. Pain tends to be constant, she feels like she does not want to get out of bed some days. She feels at times like she has a band wrapped around her body at her bra line. She notes that she has had these symptoms for some time though worse over the past 6 weeks. She notes that symptoms improved after endoscopic evaluation in 2022. She thinks she took bentyl and this made her have more nausea. She does not have any vomiting. She notes appetite is very low, she has to make herself eat. Endorses weight loss, approximately 9-10 pounds as her baseline is around 120 lbs. She notes with her visual issues she has trouble seeing if there is blood or melena in stools, no obvious blood that she has seen. She uses advil maybe 1-2x/month. No alleviating factors, has not been able to determine any precipitating factors.  She has a lot of belching, no heartburn or acid regurgitation. She was given protonix and compazine but she has not tried these.   She notes PCP ordered H Pylori breath test but she has not completed this yet. She denies changes in diet or medications prior to symptoms recurring. She does note a lot more stress recently, having issues with her vision and caring for her father who is not well.   She notes that she has fecal urgency sometimes  after eating, stools can be loose to mushy, other times she may have constipation. She notes history of IBS with diarrhea, though fecal urgency worse over the past few months.   No history of pancreatic issues, she does not drink alcohol. Amylase and lipase in April were also WNL     Last EGD:2022 - Z-line regular, 38 cm from the incisors.                           - 2 cm hiatal hernia.                           - Normal stomach.                           - Normal examined duodenum. Biopsied-NORMAL  Last Colonoscopy: 2022 - The examined portion of the ileum was  normal.                           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                           - One 5 mm polyp in the cecum, removed with a cold                            snare. Resected and retrieved. Injected.                           - Two clustered 2 to 5 mm polyps in the ascending                            colon, removed with a cold snare. Resected and                            retrieved. Injected. (2 small TAs)                           - The distal rectum and anal verge are normal on                            retroflexion view.   Recommendations: Repeat TCS 7 years  Past Medical History:  Diagnosis Date   Abdominal pain, right upper quadrant 06/10/2018   Anxiety    Arthritis    Bursitis    DDD (degenerative disc disease)    Depression    Fibromyalgia    Obsessive-compulsive disorder    Overactive bladder    Polycythemia vera(238.4) January 2013   PTSD (post-traumatic stress disorder)    PVC's (premature ventricular contractions)    pt. placed on heart monitor x 24hours, everything fine    Sesamoiditis February 2017    Past Surgical History:  Procedure Laterality Date   ABDOMINAL HYSTERECTOMY     BIOPSY  01/04/2021   Procedure: BIOPSY;  Surgeon: Dolores Frame, MD;  [facility]: [facility];  Service: Gastroenterology;;  small bowel biopsy   COLONOSCOPY WITH PROPOFOL N/A 01/04/2021   Procedure: COLONOSCOPY WITH PROPOFOL;  Surgeon: Dolores Frame, MD;  Location: [facility];  Service: Gastroenterology;  Laterality: N/A;  1115   ESOPHAGOGASTRODUODENOSCOPY N/A 09/10/2014   Procedure: ESOPHAGOGASTRODUODENOSCOPY (EGD);  Surgeon: Malissa Hippo, MD;  Location: [facility];  Service: Endoscopy;  Laterality: N/A;  130 - moved to 3:15 - Ann to notify   ESOPHAGOGASTRODUODENOSCOPY (EGD) WITH PROPOFOL N/A 01/04/2021   Procedure: ESOPHAGOGASTRODUODENOSCOPY (EGD) WITH PROPOFOL;  Surgeon: Dolores Frame, MD;  Location: [facility];   Service: Gastroenterology;  Laterality: N/A;   LUMBAR FUSION     POLYPECTOMY  01/04/2021   Procedure: POLYPECTOMY INTESTINAL;  Surgeon: Marguerita Merles, Reuel Boom, MD;  Location: [facility];  Service: Gastroenterology;;  cecal lesion; ascending colon polyp;    TOOTH EXTRACTION Left Aug 2016    Current Outpatient Medications  Medication Sig Dispense Refill   acetaminophen (TYLENOL) 325 MG tablet Take 2 tablets by mouth every 4 (four) hours as needed.     aspirin 81 MG tablet Take 1 tablet (81 mg total) by mouth daily. For Polycythemia Vera.     clonazePAM (KLONOPIN) 0.5 MG tablet Take 1 tablet (0.5 mg total) by mouth 3 (three) times daily as needed for anxiety. 90 tablet 2   methocarbamol (ROBAXIN) 500 MG tablet Take 500 mg by mouth every 8 (eight) hours as needed for muscle spasms.     prochlorperazine (COMPAZINE) 5 MG tablet Take 10 mg by mouth 4 (four) times daily.     sertraline (ZOLOFT) 100 MG tablet TAKE 1 AND 1/2 TABLETS(150 MG) BY MOUTH DAILY 45 tablet 3   traMADol (ULTRAM) 50 MG tablet Take 1 tablet (50 mg total) by mouth daily as needed for severe pain. 30 tablet 0   traZODone (DESYREL) 50 MG tablet TAKE 1 TABLET(50 MG) BY MOUTH AT BEDTIME 30 tablet 3   dicyclomine (BENTYL) 10 MG capsule Take 1 capsule (10 mg total) by mouth every 12 (twelve) hours as needed (abdominal pain). (Patient not taking: Reported on 02/26/2023) 60 capsule 2   No current facility-administered medications for this visit.    Allergies as of 02/26/2023 - Review Complete 02/26/2023  Allergen Reaction Noted   Gabapentin Hives 03/21/2012   Hydrocodone-acetaminophen Nausea Only 10/30/2018   Penicillins Other (See Comments)     Family History  Problem Relation Age of Onset   Cancer - Colon Mother        age 29   Anxiety disorder Mother    Diabetes Mother    Atrial fibrillation Father    Lung cancer Father    Atrial fibrillation Brother    Bipolar disorder Neg Hx    Dementia Neg Hx    Drug abuse Neg Hx     Paranoid behavior Neg Hx    Schizophrenia Neg Hx    Seizures Neg Hx    Sexual abuse Neg Hx    Physical abuse Neg Hx     Social History   Socioeconomic History   Marital status: Widowed    Spouse name: Not on file   Number of children: 0   Years of education: BA   Highest education level: Not on file  Occupational History   Occupation: [employer]- on leave (since 08/2020)  Tobacco Use   Smoking status: Every Day    Packs/day: 1.00    Years: 15.00    Additional pack years: 0.00    Total pack years: 15.00    Types: Cigarettes    Start date: 11/12/1996   Smokeless tobacco: Never   Tobacco comments:  1 pack a day x 20 yrs.  Vaping Use   Vaping Use: Never used  Substance and Sexual Activity   Alcohol use: No    Alcohol/week: 0.0 standard drinks of alcohol   Drug use: No    Types: Hydrocodone, Benzodiazepines   Sexual activity: Not on file  Other Topics Concern   Not on file  Social History Narrative   Right handed   2-3 glasses soda per day   Lives alone   Social Determinants of Health   Financial Resource Strain: Not on file  Food Insecurity: Not on file  Transportation Needs: Not on file  Physical Activity: Not on file  Stress: Not on file  Social Connections: Not on file    Review of systems General: negative for malaise, night sweats, fever, chills, +weight loss  Neck: Negative for lumps, goiter, pain and significant neck swelling Resp: Negative for cough, wheezing, dyspnea at rest CV: Negative for chest pain, leg swelling, palpitations, orthopnea GI: denies melena, hematochezia, vomiting, constipation, dysphagia, odyonophagia, early satiety or unintentional weight loss. +loose stools +fecal urgency + Epigastric to RUQ pain, nausea  MSK: Negative for joint pain or swelling, back pain, and muscle pain. Derm: Negative for itching or rash Psych: Denies depression, anxiety, memory loss, confusion. No homicidal or suicidal ideation.   Heme: Negative for prolonged bleeding, bruising easily, and swollen nodes. Endocrine: Negative for cold or heat intolerance, polyuria, polydipsia and goiter. Neuro: negative for tremor, gait imbalance, syncope and seizures. The remainder of the review of systems is noncontributory.  Physical Exam: There were no vitals taken for this visit. General:   Alert and oriented. No distress noted. Pleasant and cooperative.  Head:  Normocephalic and atraumatic. Eyes:  Conjuctiva clear without scleral icterus. Mouth:  Oral mucosa pink and moist. Good dentition. No lesions. Heart: Normal rate and rhythm, s1 and s2 heart sounds present.  Lungs: Clear lung sounds in all lobes. Respirations equal and unlabored. Abdomen:  +BS, soft, and non-distended. TTP of epigastric to ruq area. No rebound or guarding. No HSM or masses noted. Derm: No palmar erythema or jaundice Msk:  Symmetrical without gross deformities. Normal posture. Extremities:  Without edema. Neurologic:  Alert and  oriented x4 Psych:  Alert and cooperative. Normal mood and affect.  Invalid input(s): "6 MONTHS"   ASSESSMENT: Cheryl Cross is a 57 y.o. female presenting today for upper abdominal pain and nausea.  Upper abdominal pain/nausea: pain mostly epigastric to RUQ, Occasionally LUQ. She has had these symptoms for years, though notes worsening over the past 6 weeks. Has lost about 10 pounds, no real precipitating or alleviating factors. Last EGD in 2022 was unremarkable, recent US of GB and HIDA scan both normal other than small GB polyp. Eating does not make her pain worse. Given she has had pain chronically, this could be functional/related to IBS, however, considering her symptoms have worsened recently with accompanied weight loss, Would recommend starting protonix 40mg  daily that her PCP sent her and proceeding with EGD to rule out PUD, gastritis, duodenitis, H pylori. Low suspicion for pancreatic etiology as she has no real risk  factors for this, recent lipase and amylase were normal. Indications, risks and benefits of procedure discussed in detail with patient. Patient verbalized understanding and is in agreement to proceed with EGD.  IBS/Fecal urgency: given bentyl in the past but felt it made her more nauseated. She has history of loose stools though worsening fecal urgency over the past few months. 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 She was advised to do low FODMAP diet in the past. I discussed trial of levsin for her fecal urgency, however, she prefers to hold off at this time.    PLAN:  Start protonix  daily   2. Can use compazine PRN  3. Schedule EGD ASA II   All questions were answered, patient verbalized understanding and is in agreement with plan as outlined above.   Follow Up: 3 months   Zaharah Amir L. Jeanmarie Hubert, MSN, APRN, AGNP-C Adult-Gerontology Nurse Practitioner [facility]  I have reviewed the note and agree with the APP's assessment as described in this progress note  May consider CT abdomen with IV contrast if normal EGD.  Will need to perform repeat US in 1 year.  Katrinka Blazing, MD Gastroenterology and Hepatology [facility]

## 2023-02-26 NOTE — H&P (View-Only) (Signed)
 Referring Provider: Fusco, Lawrence, MD Primary Care Physician:  Fusco, Lawrence, MD Primary GI Physician: Castaneda   Chief Complaint  Patient presents with   Abdominal Pain    Having upper abdominal pain on both sides around rib cage and nausea. Pcp ordered ultrasound of gallbladder and  hida scan. Pt reports it was normal.    HPI:   Cheryl Cross is a 56 y.o. female with past medical history of  IBS-M, anxiety, depression, fibromyalgia, OCD, PTSD   Patient presenting today for abdominal pain and nausea   Last seen 12/2020, at that time reported for  atleast 10 years she has presented RUQ pain intermittently. Has the discomfort 3-4 times a week, which lasts for a few minutes but she states that she does not take anything for the pain as it usually resolves on its own. She describes the pain as stabbing and does not radiate anywhere else. She also reports a feeling of persistent fullness through the day in her upper abdomen. It initially affected her appetite but now she has been eating better than in the past. having some bloating.  On the other hand, patient states that she used to have urgency and episodes of diarrhea for multiple years for which she was seen in the GI clinic 3 years ago, but 6 months ago she noticed a change in her bowel movement frequency. Now she is having a bowel movement every other day, has to strain significantly to have a BM sometimes; however, she does not take any laxatives.  Notably, she is taking tramadol once a week for back pain. underwent a HIDA scan in 05/09/2020 which was normal.  Also had a complete US abdomen on 04/20/20 which showed a small GB polyp but no other alteration.  Recommended to schedule EGD/Colonoscopy, start bentyl q12h PRN, TTG IgA, start miralax  EGD/Colonoscopy as below, celiac testing was negative.  Recent US abdomen and HIDA scan in early April 2024 with GB polyp of 5.9mm, normal GB EF  Present:  Patient reports epigastric to  right upper quadrant abdominal pain and constant nausea. Reports US and HIDA scan were both normal. Pain tends to be constant, she feels like she does not want to get out of bed some days. She feels at times like she has a band wrapped around her body at her bra line. She notes that she has had these symptoms for some time though worse over the past 6 weeks. She notes that symptoms improved after endoscopic evaluation in 2022. She thinks she took bentyl and this made her have more nausea. She does not have any vomiting. She notes appetite is very low, she has to make herself eat. Endorses weight loss, approximately 9-10 pounds as her baseline is around 120 lbs. She notes with her visual issues she has trouble seeing if there is blood or melena in stools, no obvious blood that she has seen. She uses advil maybe 1-2x/month. No alleviating factors, has not been able to determine any precipitating factors.  She has a lot of belching, no heartburn or acid regurgitation. She was given protonix and compazine but she has not tried these.   She notes PCP ordered H Pylori breath test but she has not completed this yet. She denies changes in diet or medications prior to symptoms recurring. She does note a lot more stress recently, having issues with her vision and caring for her father who is not well.   She notes that she has fecal urgency sometimes   after eating, stools can be loose to mushy, other times she may have constipation. She notes history of IBS with diarrhea, though fecal urgency worse over the past few months.   No history of pancreatic issues, she does not drink alcohol. Amylase and lipase in April were also WNL     Last EGD:2022 - Z-line regular, 38 cm from the incisors.                           - 2 cm hiatal hernia.                           - Normal stomach.                           - Normal examined duodenum. Biopsied-NORMAL  Last Colonoscopy: 2022 - The examined portion of the ileum was  normal.                           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                           - One 5 mm polyp in the cecum, removed with a cold                            snare. Resected and retrieved. Injected.                           - Two clustered 2 to 5 mm polyps in the ascending                            colon, removed with a cold snare. Resected and                            retrieved. Injected. (2 small TAs)                           - The distal rectum and anal verge are normal on                            retroflexion view.   Recommendations: Repeat TCS 7 years  Past Medical History:  Diagnosis Date   Abdominal pain, right upper quadrant 06/10/2018   Anxiety    Arthritis    Bursitis    DDD (degenerative disc disease)    Depression    Fibromyalgia    Obsessive-compulsive disorder    Overactive bladder    Polycythemia vera(238.4) January 2013   PTSD (post-traumatic stress disorder)    PVC's (premature ventricular contractions)    pt. placed on heart monitor x 24hours, everything fine    Sesamoiditis February 2017    Past Surgical History:  Procedure Laterality Date   ABDOMINAL HYSTERECTOMY     BIOPSY  01/04/2021   Procedure: BIOPSY;  Surgeon: Castaneda Mayorga, Daniel, MD;  Location: [facility];  Service: Gastroenterology;;  small bowel biopsy   COLONOSCOPY WITH PROPOFOL N/A 01/04/2021   Procedure: COLONOSCOPY WITH PROPOFOL;  Surgeon: Castaneda Mayorga, Daniel, MD;    Location: [facility];  Service: Gastroenterology;  Laterality: N/A;  1115   ESOPHAGOGASTRODUODENOSCOPY N/A 09/10/2014   Procedure: ESOPHAGOGASTRODUODENOSCOPY (EGD);  Surgeon: Najeeb U Rehman, MD;  Location: [facility];  Service: Endoscopy;  Laterality: N/A;  130 - moved to 3:15 - Ann to notify   ESOPHAGOGASTRODUODENOSCOPY (EGD) WITH PROPOFOL N/A 01/04/2021   Procedure: ESOPHAGOGASTRODUODENOSCOPY (EGD) WITH PROPOFOL;  Surgeon: Castaneda Mayorga, Daniel, MD;  Location: [facility];   Service: Gastroenterology;  Laterality: N/A;   LUMBAR FUSION     POLYPECTOMY  01/04/2021   Procedure: POLYPECTOMY INTESTINAL;  Surgeon: Castaneda Mayorga, Daniel, MD;  Location: [facility];  Service: Gastroenterology;;  cecal lesion; ascending colon polyp;    TOOTH EXTRACTION Left Aug 2016    Current Outpatient Medications  Medication Sig Dispense Refill   acetaminophen (TYLENOL) 325 MG tablet Take 2 tablets by mouth every 4 (four) hours as needed.     aspirin 81 MG tablet Take 1 tablet (81 mg total) by mouth daily. For Polycythemia Vera.     clonazePAM (KLONOPIN) 0.5 MG tablet Take 1 tablet (0.5 mg total) by mouth 3 (three) times daily as needed for anxiety. 90 tablet 2   methocarbamol (ROBAXIN) 500 MG tablet Take 500 mg by mouth every 8 (eight) hours as needed for muscle spasms.     prochlorperazine (COMPAZINE) 5 MG tablet Take 10 mg by mouth 4 (four) times daily.     sertraline (ZOLOFT) 100 MG tablet TAKE 1 AND 1/2 TABLETS(150 MG) BY MOUTH DAILY 45 tablet 3   traMADol (ULTRAM) 50 MG tablet Take 1 tablet (50 mg total) by mouth daily as needed for severe pain. 30 tablet 0   traZODone (DESYREL) 50 MG tablet TAKE 1 TABLET(50 MG) BY MOUTH AT BEDTIME 30 tablet 3   dicyclomine (BENTYL) 10 MG capsule Take 1 capsule (10 mg total) by mouth every 12 (twelve) hours as needed (abdominal pain). (Patient not taking: Reported on 02/26/2023) 60 capsule 2   No current facility-administered medications for this visit.    Allergies as of 02/26/2023 - Review Complete 02/26/2023  Allergen Reaction Noted   Gabapentin Hives 03/21/2012   Hydrocodone-acetaminophen Nausea Only 10/30/2018   Penicillins Other (See Comments)     Family History  Problem Relation Age of Onset   Cancer - Colon Mother        age 74   Anxiety disorder Mother    Diabetes Mother    Atrial fibrillation Father    Lung cancer Father    Atrial fibrillation Brother    Bipolar disorder Neg Hx    Dementia Neg Hx    Drug abuse Neg Hx     Paranoid behavior Neg Hx    Schizophrenia Neg Hx    Seizures Neg Hx    Sexual abuse Neg Hx    Physical abuse Neg Hx     Social History   Socioeconomic History   Marital status: Widowed    Spouse name: Not on file   Number of children: 0   Years of education: BA   Highest education level: Not on file  Occupational History   Occupation: [employer]- on leave (since 08/2020)  Tobacco Use   Smoking status: Every Day    Packs/day: 1.00    Years: 15.00    Additional pack years: 0.00    Total pack years: 15.00    Types: Cigarettes    Start date: 11/12/1996   Smokeless tobacco: Never   Tobacco comments:      1 pack a day x 20 yrs.  Vaping Use   Vaping Use: Never used  Substance and Sexual Activity   Alcohol use: No    Alcohol/week: 0.0 standard drinks of alcohol   Drug use: No    Types: Hydrocodone, Benzodiazepines   Sexual activity: Not on file  Other Topics Concern   Not on file  Social History Narrative   Right handed   2-3 glasses soda per day   Lives alone   Social Determinants of Health   Financial Resource Strain: Not on file  Food Insecurity: Not on file  Transportation Needs: Not on file  Physical Activity: Not on file  Stress: Not on file  Social Connections: Not on file    Review of systems General: negative for malaise, night sweats, fever, chills, +weight loss  Neck: Negative for lumps, goiter, pain and significant neck swelling Resp: Negative for cough, wheezing, dyspnea at rest CV: Negative for chest pain, leg swelling, palpitations, orthopnea GI: denies melena, hematochezia, vomiting, constipation, dysphagia, odyonophagia, early satiety or unintentional weight loss. +loose stools +fecal urgency + Epigastric to RUQ pain, nausea  MSK: Negative for joint pain or swelling, back pain, and muscle pain. Derm: Negative for itching or rash Psych: Denies depression, anxiety, memory loss, confusion. No homicidal or suicidal ideation.   Heme: Negative for prolonged bleeding, bruising easily, and swollen nodes. Endocrine: Negative for cold or heat intolerance, polyuria, polydipsia and goiter. Neuro: negative for tremor, gait imbalance, syncope and seizures. The remainder of the review of systems is noncontributory.  Physical Exam: There were no vitals taken for this visit. General:   Alert and oriented. No distress noted. Pleasant and cooperative.  Head:  Normocephalic and atraumatic. Eyes:  Conjuctiva clear without scleral icterus. Mouth:  Oral mucosa pink and moist. Good dentition. No lesions. Heart: Normal rate and rhythm, s1 and s2 heart sounds present.  Lungs: Clear lung sounds in all lobes. Respirations equal and unlabored. Abdomen:  +BS, soft, and non-distended. TTP of epigastric to ruq area. No rebound or guarding. No HSM or masses noted. Derm: No palmar erythema or jaundice Msk:  Symmetrical without gross deformities. Normal posture. Extremities:  Without edema. Neurologic:  Alert and  oriented x4 Psych:  Alert and cooperative. Normal mood and affect.  Invalid input(s): "6 MONTHS"   ASSESSMENT: Cheryl Cross is a 56 y.o. female presenting today for upper abdominal pain and nausea.  Upper abdominal pain/nausea: pain mostly epigastric to RUQ, Occasionally LUQ. She has had these symptoms for years, though notes worsening over the past 6 weeks. Has lost about 10 pounds, no real precipitating or alleviating factors. Last EGD in 2022 was unremarkable, recent US of GB and HIDA scan both normal other than small GB polyp. Eating does not make her pain worse. Given she has had pain chronically, this could be functional/related to IBS, however, considering her symptoms have worsened recently with accompanied weight loss, Would recommend starting protonix 40mg daily that her PCP sent her and proceeding with EGD to rule out PUD, gastritis, duodenitis, H pylori. Low suspicion for pancreatic etiology as she has no real risk  factors for this, recent lipase and amylase were normal. Indications, risks and benefits of procedure discussed in detail with patient. Patient verbalized understanding and is in agreement to proceed with EGD.  IBS/Fecal urgency: given bentyl in the past but felt it made her more nauseated. She has history of loose stools though worsening fecal urgency over the past few months. 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 She was advised to do low FODMAP diet in the past. I discussed trial of levsin for her fecal urgency, however, she prefers to hold off at this time.    PLAN:  Start protonix 40mg daily   2. Can use compazine PRN  3. Schedule EGD ASA II   All questions were answered, patient verbalized understanding and is in agreement with plan as outlined above.   Follow Up: 3 months   Mahamed Zalewski L. Maleek Craver, MSN, APRN, AGNP-C Adult-Gerontology Nurse Practitioner [facility]  I have reviewed the note and agree with the APP's assessment as described in this progress note  May consider CT abdomen with IV contrast if normal EGD.  Will need to perform repeat US in 1 year.  Daniel Castaneda, MD Gastroenterology and Hepatology [facility] 

## 2023-02-26 NOTE — Telephone Encounter (Signed)
Pt is legally blind and unable to read cancellation policy. I went over cancellation policy with pt. Pt verbalized understanding.

## 2023-02-26 NOTE — Patient Instructions (Addendum)
I would recommend starting the protonix  once daily that your PCP gave you You can also use the compazine as needed for nausea We will get you scheduled for upper endoscopy to rule out other causes of your pain  Follow up 3 months   It was a pleasure to see you today. I want to create trusting relationships with patients and provide genuine, compassionate, and quality care. I truly value your feedback! please be on the lookout for a survey regarding your visit with me today. I appreciate your input about our visit and your time in completing this!    Marcquis Ridlon L. Jeanmarie Hubert, MSN, APRN, AGNP-C Adult-Gerontology Nurse Practitioner [facility]

## 2023-02-28 ENCOUNTER — Telehealth: Payer: Self-pay | Admitting: Physician Assistant

## 2023-03-01 ENCOUNTER — Telehealth (INDEPENDENT_AMBULATORY_CARE_PROVIDER_SITE_OTHER): Payer: BC Managed Care – PPO | Admitting: Psychiatry

## 2023-03-01 ENCOUNTER — Encounter (HOSPITAL_COMMUNITY): Payer: Self-pay | Admitting: Psychiatry

## 2023-03-01 DIAGNOSIS — F429 Obsessive-compulsive disorder, unspecified: Secondary | ICD-10-CM

## 2023-03-01 DIAGNOSIS — F321 Major depressive disorder, single episode, moderate: Secondary | ICD-10-CM

## 2023-03-01 MED ORDER — CLONAZEPAM 0.5 MG PO TABS: 0.5000 mg | ORAL_TABLET | Freq: Three times a day (TID) | ORAL | 2 refills | Status: DC | PRN

## 2023-03-01 MED ORDER — SERTRALINE HCL 100 MG PO TABS: ORAL_TABLET | ORAL | 3 refills | Status: DC

## 2023-03-01 NOTE — Progress Notes (Signed)
Virtual Visit via Video Note  I connected with Chantae Soo Wierman on 03/01/23 at 10:00 AM EDT by a video enabled telemedicine application and verified that I am speaking with the correct person using two identifiers.  Location: Patient: home Provider: home office   I discussed the limitations of evaluation and management by telemedicine and the availability of in person appointments. The patient expressed understanding and agreed to proceed.     I discussed the assessment and treatment plan with the patient. The patient was provided an opportunity to ask questions and all were answered. The patient agreed with the plan and demonstrated an understanding of the instructions.   The patient was advised to call back or seek an in-person evaluation if the symptoms worsen or if the condition fails to improve as anticipated.  I provided 15 minutes of non-face-to-face time during this encounter.   [name], MD  [facility] MD/PA/NP OP Progress Note  03/01/2023 10:29 AM AILI CASILLAS  MRN:  [id number]  Chief Complaint:  Chief Complaint  Patient presents with   Anxiety   Depression   Follow-up   HPI:  Patient is a 57 year old widowed white female who lives alone in [place].  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  Last time she felt more depressed and had very low energy.  We tried adding Wellbutrin but it actually made her feel worse after about a week so she stopped it.  She is doing better now that she can get out in the nice weather and spend time with her niece and nephew.  She is having a lot of abdominal pain and is being seen by GI.  It could be that she may have to have her gallbladder removed.  She has lost more weight and is down to 111 pounds.  She states that she has "absolutely no appetite."  In terms of mood the patient states she  has been stable.  She is sleeping well.  She denies thoughts of self-harm or suicide.  She is still trying to help her father with his declining health. Visit Diagnosis:    ICD-10-CM   1. Major depressive disorder, single episode, moderate  F32.1 sertraline (ZOLOFT) 100 MG tablet    2. Obsessive-compulsive disorder, unspecified type  F42.9 sertraline (ZOLOFT) 100 MG tablet      Past Psychiatric History: Psychiatric hospitalization in 2013, otherwise outpatient treatment  Past Medical History:  Past Medical History:  Diagnosis Date   Abdominal pain, right upper quadrant 06/10/2018   Anxiety    Arthritis    Bursitis    DDD (degenerative disc disease)    Depression    Fibromyalgia    Nausea and vomiting 02/26/2023   Obsessive-compulsive disorder    Overactive bladder    Polycythemia vera(238.4) January 2013   PTSD (post-traumatic stress disorder)    PVC's (premature ventricular contractions)    pt. placed on heart monitor x 24hours, everything fine    Sesamoiditis February 2017    Past Surgical History:  Procedure Laterality Date   ABDOMINAL HYSTERECTOMY     BIOPSY  01/04/2021   Procedure: BIOPSY;  Surgeon: Dolores Frame, MD;  [facility];  Service: Gastroenterology;;  small bowel biopsy   COLONOSCOPY WITH PROPOFOL N/A 01/04/2021   Procedure: COLONOSCOPY WITH PROPOFOL;  Surgeon: Dolores Frame, MD;  [facility];  Service: Gastroenterology;  Laterality: N/A;  1115   ESOPHAGOGASTRODUODENOSCOPY N/A 09/10/2014   Procedure: ESOPHAGOGASTRODUODENOSCOPY (EGD);  Surgeon: Malissa Hippo, MD;  [facility];  Service: Endoscopy;  Laterality: N/A;  130 - moved to 3:15 - Ann to notify   ESOPHAGOGASTRODUODENOSCOPY (EGD) WITH PROPOFOL N/A 01/04/2021   Procedure: ESOPHAGOGASTRODUODENOSCOPY (EGD) WITH PROPOFOL;  Surgeon: Dolores Frame, MD;  [facility];  Service: Gastroenterology;  Laterality: N/A;   LUMBAR FUSION      POLYPECTOMY  01/04/2021   Procedure: POLYPECTOMY INTESTINAL;  Surgeon: Dolores Frame, MD;  [facility];  Service: Gastroenterology;;  cecal lesion; ascending colon polyp;    TOOTH EXTRACTION Left Aug 2016    Family Psychiatric History: See below  Family History:  Family History  Problem Relation Age of Onset   Cancer - Colon Mother        age 23   Anxiety disorder Mother    Diabetes Mother    Atrial fibrillation Father    Lung cancer Father    Atrial fibrillation Brother    Bipolar disorder Neg Hx    Dementia Neg Hx    Drug abuse Neg Hx    Paranoid behavior Neg Hx    Schizophrenia Neg Hx    Seizures Neg Hx    Sexual abuse Neg Hx    Physical abuse Neg Hx     Social History:  Social History   Socioeconomic History   Marital status: Widowed    Spouse name: Not on file   Number of children: 0   Years of education: BA   Highest education level: Not on file  Occupational History   Occupation: [employer]- on leave (since 08/2020)  Tobacco Use   Smoking status: Every Day    Packs/day: 1.00    Years: 15.00    Additional pack years: 0.00    Total pack years: 15.00    Types: Cigarettes    Start date: 11/12/1996    Passive exposure: Current   Smokeless tobacco: Never   Tobacco comments:    1 pack a day x 20 yrs.  Vaping Use   Vaping Use: Never used  Substance and Sexual Activity   Alcohol use: No    Alcohol/week: 0.0 standard drinks of alcohol   Drug use: No    Types: Hydrocodone, Benzodiazepines   Sexual activity: Not on file  Other Topics Concern   Not on file  Social History Narrative   Right handed   2-3 glasses soda per day   Lives alone   Social Determinants of Health   Financial Resource Strain: Not on file  Food Insecurity: Not on file  Transportation Needs: Not on file  Physical Activity: Not on file  Stress: Not on file  Social Connections: Not on file    Allergies:  Allergies  Allergen Reactions    Gabapentin Hives   Hydrocodone-Acetaminophen Nausea Only   Penicillins Other (See Comments)    Unknown child hood reaction .Did it involve swelling of the face/tongue/throat, SOB, or low BP? Unknown Did it involve sudden or severe rash/hives, skin peeling, or any reaction on the inside of your mouth or nose? Unknown Did you need to seek medical attention at a hospital or doctor's office? Unknown When did it last happen?       If all above answers are "NO", may proceed with cephalosporin use.     Metabolic Disorder Labs: No results found for: "HGBA1C", "MPG" No results found  for: "PROLACTIN" No results found for: "CHOL", "TRIG", "HDL", "CHOLHDL", "VLDL", "LDLCALC" Lab Results  Component Value Date   TSH 1.770 10/13/2020   TSH 1.490 05/28/2016    Therapeutic Level Labs: No results found for: "LITHIUM" No results found for: "VALPROATE" No results found for: "CBMZ"  Current Medications: Current Outpatient Medications  Medication Sig Dispense Refill   acetaminophen (TYLENOL) 325 MG tablet Take 2 tablets by mouth every 4 (four) hours as needed.     aspirin 81 MG tablet Take 1 tablet (81 mg total) by mouth daily. For Polycythemia Vera.     clonazePAM (KLONOPIN) 0.5 MG tablet Take 1 tablet (0.5 mg total) by mouth 3 (three) times daily as needed for anxiety. 90 tablet 2   dicyclomine (BENTYL) 10 MG capsule Take 1 capsule (10 mg total) by mouth every 12 (twelve) hours as needed (abdominal pain). (Patient not taking: Reported on 02/26/2023) 60 capsule 2   methocarbamol (ROBAXIN) 500 MG tablet Take 500 mg by mouth every 8 (eight) hours as needed for muscle spasms.     prochlorperazine (COMPAZINE) 5 MG tablet Take 10 mg by mouth 4 (four) times daily.     sertraline (ZOLOFT) 100 MG tablet TAKE 1 AND 1/2 TABLETS(150 MG) BY MOUTH DAILY 45 tablet 3   traMADol (ULTRAM) 50 MG tablet Take 1 tablet (50 mg total) by mouth daily as needed for severe pain. 30 tablet 0   traZODone (DESYREL) 50 MG tablet  TAKE 1 TABLET(50 MG) BY MOUTH AT BEDTIME 30 tablet 3   No current facility-administered medications for this visit.     Musculoskeletal: Strength & Muscle Tone: within normal limits Gait & Station: normal Patient leans: N/A  Psychiatric Specialty Exam: Review of Systems  Constitutional:  Positive for appetite change.  Eyes:  Positive for visual disturbance.  Gastrointestinal:  Positive for abdominal pain and nausea.  All other systems reviewed and are negative.   There were no vitals taken for this visit.There is no height or weight on file to calculate BMI.  General Appearance: Casual and Fairly Groomed  Eye Contact:  Good  Speech:  Clear and Coherent  Volume:  Normal  Mood:  Euthymic  Affect:  Congruent  Thought Process:  Goal Directed  Orientation:  Full (Time, Place, and Person)  Thought Content: Rumination   Suicidal Thoughts:  No  Homicidal Thoughts:  No  Memory:  Immediate;   Good Recent;   Good Remote;   Good  Judgement:  Good  Insight:  Good  Psychomotor Activity:  Decreased  Concentration:  Concentration: Good and Attention Span: Good  Recall:  Good  Fund of Knowledge: Good  Language: Good  Akathisia:  No  Handed:  Right  AIMS (if indicated): not done  Assets:  Communication Skills Desire for Improvement Resilience Social Support  ADL's:  Intact  Cognition: WNL  Sleep:  Good   Screenings: GAD-7    Advertising copywriter from 01/13/2021 in [facility] [place]  Total GAD-7 Score 17      PHQ2-9    Flowsheet Row Video Visit from 01/14/2023 in [facility] [place] Video Visit from 08/13/2022 in [facility] [place] Video Visit from 05/24/2022 in [facility] [place] Video Visit from 03/23/2022 in [facility] at [place] Video Visit from 01/19/2022 in [facility] Total Score 2 1 0 0 1  PHQ-9 Total  Score 6 -- -- -- --      Flowsheet Row Video Visit from 01/14/2023 in [facility] [place] Video Visit from 08/13/2022 in [facility] at [place] Video Visit from 05/24/2022 in [facility] at [place]  C-SSRS RISK CATEGORY No Risk No Risk No Risk        Assessment and Plan: This patient is a 57 year old female with a 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  She does still feel like her medications are not helping her depression and anxiety so she will continue Zoloft 150 mg daily for depression and anxiety and lorazepam 0.5 mg 3 times daily as needed for anxiety.  She will return to see me in 3 months  Collaboration of Care: Collaboration of Care: Primary Care Provider AEB notes will be shared with PCP at patient's request  Patient/Guardian was advised Release of Information must be obtained prior to any record release in order to collaborate their care with an outside provider. Patient/Guardian was advised if they have not already done so to contact the registration department to sign all necessary forms in order for [place] to release information regarding their care.   Consent: Patient/Guardian gives verbal consent for treatment and assignment of benefits for services provided during this visit. Patient/Guardian expressed understanding and agreed to proceed.    [name], MD 03/01/2023, 10:29 AM

## 2023-03-06 ENCOUNTER — Ambulatory Visit (HOSPITAL_COMMUNITY)
Admission: RE | Admit: 2023-03-06 | Discharge: 2023-03-06 | Disposition: A | Discharge status: Home / Self Care | Payer: BC Managed Care – PPO | Source: Ambulatory Visit | Attending: Obstetrics & Gynecology | Admitting: Obstetrics & Gynecology

## 2023-03-06 ENCOUNTER — Encounter (HOSPITAL_COMMUNITY): Payer: Self-pay

## 2023-03-06 DIAGNOSIS — Z1231 Encounter for screening mammogram for malignant neoplasm of breast: Secondary | ICD-10-CM | POA: Diagnosis present

## 2023-03-27 ENCOUNTER — Ambulatory Visit (HOSPITAL_COMMUNITY): Payer: BC Managed Care – PPO | Admitting: Anesthesiology

## 2023-03-27 ENCOUNTER — Encounter (HOSPITAL_COMMUNITY)
Admission: RE | Disposition: A | Discharge status: Home / Self Care | Payer: Self-pay | Source: Home / Self Care | Attending: Gastroenterology

## 2023-03-27 ENCOUNTER — Other Ambulatory Visit: Payer: Self-pay

## 2023-03-27 ENCOUNTER — Encounter (HOSPITAL_COMMUNITY): Payer: Self-pay | Admitting: Gastroenterology

## 2023-03-27 ENCOUNTER — Ambulatory Visit (HOSPITAL_COMMUNITY)
Admission: RE | Admit: 2023-03-27 | Discharge: 2023-03-27 | Disposition: A | Discharge status: Home / Self Care | Payer: BC Managed Care – PPO | Attending: Gastroenterology | Admitting: Gastroenterology

## 2023-03-27 DIAGNOSIS — M797 Fibromyalgia: Secondary | ICD-10-CM | POA: Insufficient documentation

## 2023-03-27 DIAGNOSIS — F418 Other specified anxiety disorders: Secondary | ICD-10-CM | POA: Diagnosis not present

## 2023-03-27 DIAGNOSIS — D45 Polycythemia vera: Secondary | ICD-10-CM | POA: Diagnosis not present

## 2023-03-27 DIAGNOSIS — R110 Nausea: Secondary | ICD-10-CM | POA: Insufficient documentation

## 2023-03-27 DIAGNOSIS — K589 Irritable bowel syndrome without diarrhea: Secondary | ICD-10-CM | POA: Diagnosis not present

## 2023-03-27 DIAGNOSIS — H3553 Other dystrophies primarily involving the sensory retina: Secondary | ICD-10-CM | POA: Insufficient documentation

## 2023-03-27 DIAGNOSIS — K3189 Other diseases of stomach and duodenum: Secondary | ICD-10-CM | POA: Insufficient documentation

## 2023-03-27 DIAGNOSIS — K31A19 Gastric intestinal metaplasia without dysplasia, unspecified site: Secondary | ICD-10-CM | POA: Insufficient documentation

## 2023-03-27 DIAGNOSIS — F17210 Nicotine dependence, cigarettes, uncomplicated: Secondary | ICD-10-CM | POA: Diagnosis not present

## 2023-03-27 DIAGNOSIS — F429 Obsessive-compulsive disorder, unspecified: Secondary | ICD-10-CM | POA: Diagnosis not present

## 2023-03-27 DIAGNOSIS — F32A Depression, unspecified: Secondary | ICD-10-CM | POA: Insufficient documentation

## 2023-03-27 DIAGNOSIS — F4310 Post-traumatic stress disorder, unspecified: Secondary | ICD-10-CM | POA: Diagnosis not present

## 2023-03-27 DIAGNOSIS — K449 Diaphragmatic hernia without obstruction or gangrene: Secondary | ICD-10-CM | POA: Diagnosis not present

## 2023-03-27 DIAGNOSIS — F1320 Sedative, hypnotic or anxiolytic dependence, uncomplicated: Secondary | ICD-10-CM | POA: Insufficient documentation

## 2023-03-27 DIAGNOSIS — M549 Dorsalgia, unspecified: Secondary | ICD-10-CM | POA: Insufficient documentation

## 2023-03-27 DIAGNOSIS — R112 Nausea with vomiting, unspecified: Secondary | ICD-10-CM

## 2023-03-27 DIAGNOSIS — Z79899 Other long term (current) drug therapy: Secondary | ICD-10-CM | POA: Insufficient documentation

## 2023-03-27 DIAGNOSIS — K2289 Other specified disease of esophagus: Secondary | ICD-10-CM | POA: Insufficient documentation

## 2023-03-27 DIAGNOSIS — R1013 Epigastric pain: Secondary | ICD-10-CM | POA: Insufficient documentation

## 2023-03-27 HISTORY — PX: ESOPHAGOGASTRODUODENOSCOPY (EGD) WITH PROPOFOL: SHX5813

## 2023-03-27 HISTORY — PX: BIOPSY: SHX5522

## 2023-03-27 SURGERY — ESOPHAGOGASTRODUODENOSCOPY (EGD) WITH PROPOFOL
Anesthesia: General

## 2023-03-27 MED ORDER — LACTATED RINGERS IV SOLN: INTRAVENOUS | Status: DC

## 2023-03-27 MED ORDER — PROPOFOL 10 MG/ML IV BOLUS
INTRAVENOUS | Status: DC | PRN
  Administered 2023-03-27: 100 mg via INTRAVENOUS
  Administered 2023-03-27 (×2): 30 mg via INTRAVENOUS
  Administered 2023-03-27: 20 mg via INTRAVENOUS

## 2023-03-27 MED ORDER — LIDOCAINE HCL (CARDIAC) PF 100 MG/5ML IV SOSY
PREFILLED_SYRINGE | INTRAVENOUS | Status: DC | PRN
  Administered 2023-03-27: 50 mg via INTRAVENOUS

## 2023-03-27 NOTE — Interval H&P Note (Signed)
History and Physical Interval Note:  03/27/2023 7:55 AM  Cheryl Cross  has presented today for surgery, with the diagnosis of nausea, epigastric pain.  The various methods of treatment have been discussed with the patient and family. After consideration of risks, benefits and other options for treatment, the patient has consented to  Procedure(s) with comments: ESOPHAGOGASTRODUODENOSCOPY (EGD) WITH PROPOFOL (N/A) - 9:00am;asa 2 as a surgical intervention.  The patient's history has been reviewed, patient examined, no change in status, stable for surgery.  I have reviewed the patient's chart and labs.  Questions were answered to the patient's satisfaction.     Katrinka Blazing Mayorga

## 2023-03-27 NOTE — Anesthesia Preprocedure Evaluation (Addendum)
Anesthesia Evaluation  Patient identified by MRN, date of birth, ID band Patient awake    Reviewed: Allergy & Precautions, H&P , NPO status , Patient's Chart, lab work & pertinent test results  Airway Mallampati: II  TM Distance: >3 FB Neck ROM: Full    Dental  (+) Upper Dentures, Lower Dentures   Pulmonary Current Smoker   Pulmonary exam normal breath sounds clear to auscultation       Cardiovascular negative cardio ROS Normal cardiovascular exam Rhythm:Regular Rate:Normal     Neuro/Psych  PSYCHIATRIC DISORDERS Anxiety Depression     Neuromuscular disease    GI/Hepatic negative GI ROS,,,(+)     substance abuse (benzodiazepine dependency)    Endo/Other  negative endocrine ROS    Renal/GU negative Renal ROS  negative genitourinary   Musculoskeletal  (+) Arthritis , Osteoarthritis,  Fibromyalgia -, narcotic dependent  Abdominal   Peds negative pediatric ROS (+)  Hematology  (+) Blood dyscrasia (polycythemia vera)   Anesthesia Other Findings Stargardt's disease  Reproductive/Obstetrics negative OB ROS                             Anesthesia Physical Anesthesia Plan  ASA: 2  Anesthesia Plan: General   Post-op Pain Management: Minimal or no pain anticipated   Induction: Intravenous  PONV Risk Score and Plan: 1 and Propofol infusion  Airway Management Planned:   Additional Equipment:   Intra-op Plan:   Post-operative Plan:   Informed Consent: I have reviewed the patients History and Physical, chart, labs and discussed the procedure including the risks, benefits and alternatives for the proposed anesthesia with the patient or authorized representative who has indicated his/her understanding and acceptance.     Dental advisory given  Plan Discussed with: CRNA and Surgeon  Anesthesia Plan Comments:        Anesthesia Quick Evaluation

## 2023-03-27 NOTE — Transfer of Care (Signed)
Immediate Anesthesia Transfer of Care Note  Patient: Cheryl Cross  Procedure(s) Performed: ESOPHAGOGASTRODUODENOSCOPY (EGD) WITH PROPOFOL BIOPSY  Patient Location: Endoscopy Unit  Anesthesia Type:General  Level of Consciousness: drowsy  Airway & Oxygen Therapy: Patient Spontanous Breathing  Post-op Assessment: Report given to RN and Post -op Vital signs reviewed and stable  Post vital signs: Reviewed and stable  Last Vitals:  Vitals Value Taken Time  BP 85/44 03/27/23 0834  Temp 36.4 C 03/27/23 0834  Pulse 70 03/27/23 0834  Resp 23 03/27/23 0834  SpO2 94 % 03/27/23 0834    Last Pain:  Vitals:   03/27/23 0834  TempSrc: Axillary  PainSc:       Patients Stated Pain Goal: 5 (03/27/23 0740)  Complications: No notable events documented.

## 2023-03-27 NOTE — Op Note (Signed)
[facility] Patient Name: Cheryl Cross Procedure Date: 03/27/2023 8:20 AM MRN: [id number] Date of Birth: [birth year] Attending MD: Katrinka Blazing , , [id number] CSN: [id number] Age: 57 Admit Type: Outpatient Procedure:                Upper GI endoscopy Indications:              Epigastric abdominal pain, Nausea Providers:                Katrinka Blazing, Crystal Page, Lennice Sites                            Technician, Technician Referring MD:              Medicines:                Monitored Anesthesia Care Complications:            No immediate complications. Estimated Blood Loss:     Estimated blood loss: none. Procedure:                Pre-Anesthesia Assessment:                           - Prior to the procedure, a History and Physical                            was performed, and patient medications, allergies                            and sensitivities were reviewed. The patient's                            tolerance of previous anesthesia was reviewed.                           - The risks and benefits of the procedure and the                            sedation options and risks were discussed with the                            patient. All questions were answered and informed                            consent was obtained.                           - ASA Grade Assessment: II - A patient with mild                            systemic disease.                           After obtaining informed consent, the endoscope was                            passed under direct vision. Throughout the  procedure, the patient's blood pressure, pulse, and                            oxygen saturations were monitored continuously. The                            [id number]-H190 ([id number]) scope was introduced through the                            mouth, and advanced to the second part of duodenum.                            The upper GI endoscopy was accomplished  without                            difficulty. The patient tolerated the procedure                            well. Scope In: 8:25:55 AM Scope Out: 8:31:37 AM Total Procedure Duration: 0 hours 5 minutes 42 seconds  Findings:      The Z-line was irregular and was found 36 cm from the incisors. Biopsies       were taken with a cold forceps for histology.      A 3 cm hiatal hernia was present.      The gastroesophageal flap valve was visualized endoscopically and       classified as Hill Grade II (fold present, opens with respiration).      A few dispersed diminutive erosions with no stigmata of recent bleeding       were found in the gastric antrum. Biopsies were taken with a cold       forceps for Helicobacter pylori testing.      The examined duodenum was normal. Biopsies were taken with a cold       forceps for histology. Impression:               - Z-line irregular, 36 cm from the incisors.                            Biopsied.                           - 3 cm hiatal hernia.                           - Erosive gastropathy with no stigmata of recent                            bleeding. Biopsied.                           - Normal examined duodenum. Biopsied. Moderate Sedation:      Per Anesthesia Care Recommendation:           - Discharge patient to home (ambulatory).                           - Resume previous  diet.                           - Await pathology results.                           - No aspirin, ibuprofen, naproxen, or other                            non-steroidal anti-inflammatory drugs.                           - Continue present medications. Procedure Code(s):        --- Professional ---                           [phone], Esophagogastroduodenoscopy, flexible,                            transoral; with biopsy, single or multiple Diagnosis Code(s):        --- Professional ---                           K22.89, Other specified disease of esophagus                            K44.9, Diaphragmatic hernia without obstruction or                            gangrene                           K31.89, Other diseases of stomach and duodenum                           R10.13, Epigastric pain                           R11.0, Nausea CPT copyright 2022 American Medical Association. All rights reserved. The codes documented in this report are preliminary and upon coder review may  be revised to meet current compliance requirements. Katrinka Blazing, MD Katrinka Blazing,  03/27/2023 8:36:33 AM This report has been signed electronically. Number of Addenda: 0

## 2023-03-27 NOTE — Discharge Instructions (Addendum)
You are being discharged to home.  Resume your previous diet.  We are waiting for your pathology results.  Do not take any aspirin, ibuprofen (including Advil, Motrin or Nuprin), naproxen (including Aleve), or any other non-steroidal anti-inflammatory drugs.  Continue your present medications.

## 2023-03-27 NOTE — Anesthesia Postprocedure Evaluation (Signed)
Anesthesia Post Note  Patient: Cheryl Cross  Procedure(s) Performed: ESOPHAGOGASTRODUODENOSCOPY (EGD) WITH PROPOFOL BIOPSY  Patient location during evaluation: Phase II Anesthesia Type: General Level of consciousness: awake and alert and oriented Pain management: pain level controlled Vital Signs Assessment: post-procedure vital signs reviewed and stable Respiratory status: spontaneous breathing, nonlabored ventilation and respiratory function stable Cardiovascular status: blood pressure returned to baseline and stable Postop Assessment: no apparent nausea or vomiting Anesthetic complications: no  No notable events documented.   Last Vitals:  Vitals:   03/27/23 0834 03/27/23 0840  BP: (!) 85/44 (!) 94/56  Pulse: 70 61  Resp: (!) 23 (!) 29  Temp: 36.4 C   SpO2: 94% 94%    Last Pain:  Vitals:   03/27/23 0841  TempSrc:   PainSc: 0-No pain                 [name] C Mionna Advincula

## 2023-03-28 ENCOUNTER — Encounter (INDEPENDENT_AMBULATORY_CARE_PROVIDER_SITE_OTHER): Payer: Self-pay | Admitting: Gastroenterology

## 2023-03-28 LAB — SURGICAL PATHOLOGY

## 2023-04-02 ENCOUNTER — Encounter (HOSPITAL_COMMUNITY): Payer: Self-pay | Admitting: Gastroenterology

## 2023-04-02 ENCOUNTER — Ambulatory Visit (INDEPENDENT_AMBULATORY_CARE_PROVIDER_SITE_OTHER): Payer: BC Managed Care – PPO | Admitting: Gastroenterology

## 2023-04-25 ENCOUNTER — Ambulatory Visit: Payer: BC Managed Care – PPO | Admitting: General Surgery

## 2023-05-22 ENCOUNTER — Encounter (HOSPITAL_COMMUNITY): Payer: Self-pay | Admitting: Psychiatry

## 2023-05-22 ENCOUNTER — Ambulatory Visit (HOSPITAL_COMMUNITY): Payer: BC Managed Care – PPO | Admitting: Psychiatry

## 2023-05-22 DIAGNOSIS — F429 Obsessive-compulsive disorder, unspecified: Secondary | ICD-10-CM

## 2023-05-22 DIAGNOSIS — F321 Major depressive disorder, single episode, moderate: Secondary | ICD-10-CM | POA: Diagnosis not present

## 2023-05-22 MED ORDER — LORAZEPAM 0.5 MG PO TABS: 0.5000 mg | ORAL_TABLET | Freq: Three times a day (TID) | ORAL | 2 refills | Status: DC

## 2023-05-22 MED ORDER — MIRTAZAPINE 15 MG PO TABS: 15.0000 mg | ORAL_TABLET | Freq: Every day | ORAL | 2 refills | Status: DC

## 2023-05-22 MED ORDER — SERTRALINE HCL 100 MG PO TABS: ORAL_TABLET | ORAL | 3 refills | Status: DC

## 2023-05-22 NOTE — Progress Notes (Signed)
BH MD/PA/NP OP Progress Note  05/22/2023 10:16 AM Cheryl Cross  MRN:  [id number]  Chief Complaint:  Chief Complaint  Patient presents with   Depression   Anxiety   Follow-up   HPI: :  Patient is a 57 year old widowed white female who lives alone in [place].  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  She is not doing well.  She is here in person and looks very thin.  She has lost quite a bit of weight and is down to 110 pounds.  She has been evaluated by GI and has Barrett's esophagus and metaplasia and may need her gallbladder out.  She states that it is hard for her to eat as it causes diarrhea and cramping.  I urged her to at least eat small meals through the day.  She is very anxious and worried.  Social Security disability is told her that she is going to have to pay money back.  She is trying desperately to get this straightened out.  Her eyesight is getting worse.  She is taking care of her elderly parents.  She is quite depressed and anxious and but does deny thoughts of self-harm or suicide.  I suggested that we change her trazodone to mirtazapine to help with anxiety and sleep as well as weight gain.  Also the clonazepam is no longer helping anxiety so we will switch this to lorazepam I also strongly suggested therapy Visit Diagnosis:    ICD-10-CM   1. Major depressive disorder, single episode, moderate (HCC)  F32.1 sertraline (ZOLOFT) 100 MG tablet    2. Obsessive-compulsive disorder, unspecified type  F42.9 sertraline (ZOLOFT) 100 MG tablet      Past Psychiatric History: Psychiatric hospitalization in 2013, otherwise outpatient treatment  Past Medical History:  Past Medical History:  Diagnosis Date   Abdominal pain, right upper quadrant 06/10/2018   Anxiety    Arthritis    Bursitis    DDD (degenerative disc disease)    Depression     Fibromyalgia    Nausea and vomiting 02/26/2023   Obsessive-compulsive disorder    Overactive bladder    Polycythemia vera(238.4) January 2013   PTSD (post-traumatic stress disorder)    PVC's (premature ventricular contractions)    pt. placed on heart monitor x 24hours, everything fine    Sesamoiditis February 2017    Past Surgical History:  Procedure Laterality Date   ABDOMINAL HYSTERECTOMY     BIOPSY  01/04/2021   Procedure: BIOPSY;  Surgeon: Dolores Frame, MD;  [facility];  Service: Gastroenterology;;  small bowel biopsy   BIOPSY  03/27/2023   Procedure: BIOPSY;  Surgeon: Dolores Frame, MD;  [facility];  Service: Gastroenterology;;   COLONOSCOPY WITH PROPOFOL N/A 01/04/2021   Procedure: COLONOSCOPY WITH PROPOFOL;  Surgeon: Dolores Frame, MD;  [facility];  Service: Gastroenterology;  Laterality: N/A;  1115   ESOPHAGOGASTRODUODENOSCOPY N/A 09/10/2014   Procedure: ESOPHAGOGASTRODUODENOSCOPY (EGD);  Surgeon: Malissa Hippo, MD;  [facility];  Service: Endoscopy;  Laterality: N/A;  130 - moved to 3:15 - Ann to notify   ESOPHAGOGASTRODUODENOSCOPY (EGD) WITH PROPOFOL N/A 01/04/2021   Procedure: ESOPHAGOGASTRODUODENOSCOPY (EGD) WITH PROPOFOL;  Surgeon: Dolores Frame, MD;  [facility];  Service: Gastroenterology;  Laterality: N/A;   ESOPHAGOGASTRODUODENOSCOPY (EGD) WITH PROPOFOL N/A 03/27/2023  Procedure: ESOPHAGOGASTRODUODENOSCOPY (EGD) WITH PROPOFOL;  Surgeon: Dolores Frame, MD;  [facility];  Service: Gastroenterology;  Laterality: N/A;  9:00am;asa 2   LUMBAR FUSION     POLYPECTOMY  01/04/2021   Procedure: POLYPECTOMY INTESTINAL;  Surgeon: Dolores Frame, MD;  [facility];  Service: Gastroenterology;;  cecal lesion; ascending colon polyp;    TOOTH EXTRACTION Left Aug 2016    Family Psychiatric History: See below  Family History:   Family History  Problem Relation Age of Onset   Cancer - Colon Mother        age 70   Anxiety disorder Mother    Diabetes Mother    Atrial fibrillation Father    Lung cancer Father    Atrial fibrillation Brother    Bipolar disorder Neg Hx    Dementia Neg Hx    Drug abuse Neg Hx    Paranoid behavior Neg Hx    Schizophrenia Neg Hx    Seizures Neg Hx    Sexual abuse Neg Hx    Physical abuse Neg Hx     Social History:  Social History   Socioeconomic History   Marital status: Widowed    Spouse name: Not on file   Number of children: 0   Years of education: BA   Highest education level: Not on file  Occupational History   Occupation: [employer]- on leave (since 08/2020)  Tobacco Use   Smoking status: Every Day    Packs/day: 1.00    Years: 15.00    Additional pack years: 0.00    Total pack years: 15.00    Types: Cigarettes    Start date: 11/12/1996    Passive exposure: Current   Smokeless tobacco: Never   Tobacco comments:    1 pack a day x 20 yrs.  Vaping Use   Vaping Use: Never used  Substance and Sexual Activity   Alcohol use: No    Alcohol/week: 0.0 standard drinks of alcohol   Drug use: No    Types: Hydrocodone, Benzodiazepines   Sexual activity: Not on file  Other Topics Concern   Not on file  Social History Narrative   Right handed   2-3 glasses soda per day   Lives alone   Social Determinants of Health   Financial Resource Strain: Not on file  Food Insecurity: Not on file  Transportation Needs: Not on file  Physical Activity: Not on file  Stress: Not on file  Social Connections: Not on file    Allergies:  Allergies  Allergen Reactions   Gabapentin Hives   Hydrocodone-Acetaminophen Nausea Only   Penicillins Other (See Comments)    Unknown child hood reaction .Did it involve swelling of the face/tongue/throat, SOB, or low BP? Unknown Did it involve sudden or severe rash/hives, skin peeling, or any reaction on the inside of  your mouth or nose? Unknown Did you need to seek medical attention at a hospital or doctor's office? Unknown When did it last happen?       If all above answers are "NO", may proceed with cephalosporin use.     Metabolic Disorder Labs: No results found for: "HGBA1C", "MPG" No results found for: "PROLACTIN" No results found for: "CHOL", "TRIG", "HDL", "CHOLHDL", "VLDL", "LDLCALC" Lab Results  Component Value Date   TSH 1.770 10/13/2020   TSH 1.490 05/28/2016    Therapeutic Level Labs: No results found for: "LITHIUM" No results found for: "VALPROATE" No results found for: "CBMZ"  Current  Medications: Current Outpatient Medications  Medication Sig Dispense Refill   aspirin 81 MG tablet Take 1 tablet (81 mg total) by mouth daily. For Polycythemia Vera.     diclofenac Sodium (VOLTAREN) 1 % GEL Apply 1 Application topically 4 (four) times daily as needed (pain. (applied to foot area)).     LORazepam (ATIVAN) 0.5 MG tablet Take 1 tablet (0.5 mg total) by mouth 3 (three) times daily. 90 tablet 2   methocarbamol (ROBAXIN) 500 MG tablet Take 250 mg by mouth every 8 (eight) hours as needed for muscle spasms.     pantoprazole (PROTONIX) 40 MG tablet Take 40 mg by mouth daily before breakfast.     prochlorperazine (COMPAZINE) 5 MG tablet Take 5-10 mg by mouth every 6 (six) hours as needed for vomiting or nausea.     traMADol (ULTRAM) 50 MG tablet Take 1 tablet (50 mg total) by mouth daily as needed for severe pain. 30 tablet 0   mirtazapine (REMERON) 15 MG tablet Take 1 tablet (15 mg total) by mouth at bedtime. 30 tablet 2   sertraline (ZOLOFT) 100 MG tablet TAKE 1 AND 1/2 TABLETS(150 MG) BY MOUTH DAILY 45 tablet 3   No current facility-administered medications for this visit.     Musculoskeletal: Strength & Muscle Tone: within normal limits Gait & Station: normal Patient leans: N/A  Psychiatric Specialty Exam: Review of Systems  Eyes:  Positive for visual disturbance.   Gastrointestinal:  Positive for abdominal pain, diarrhea and nausea.  Psychiatric/Behavioral:  Positive for dysphoric mood. The patient is nervous/anxious.   All other systems reviewed and are negative.   Blood pressure 101/67, pulse 71, height 5\' 6"  (1.676 m), weight 110 lb 3.2 oz (50 kg), SpO2 95 %.Body mass index is 17.79 kg/m.  General Appearance: Casual and Fairly Groomed  Eye Contact:  Good  Speech:  Clear and Coherent  Volume:  Normal  Mood:  Anxious and Depressed  Affect:  Depressed  Thought Process:  Goal Directed  Orientation:  Full (Time, Place, and Person)  Thought Content: Rumination   Suicidal Thoughts:  No  Homicidal Thoughts:  No  Memory:  Immediate;   Good Recent;   Good Remote;   Good  Judgement:  Good  Insight:  Good  Psychomotor Activity:  Decreased  Concentration:  Concentration: Fair and Attention Span: Fair  Recall:  Good  Fund of Knowledge: Good  Language: Good  Akathisia:  No  Handed:  Right  AIMS (if indicated): not done  Assets:  Communication Skills Desire for Improvement Resilience Social Support  ADL's:  Intact  Cognition: WNL  Sleep:  Good   Screenings: GAD-7    Flowsheet Row Office Visit from 05/22/2023 in [facility] [place] Counselor from 01/13/2021 in [facility] at [place]  Total GAD-7 Score 21 17      PHQ2-9    Flowsheet Row Office Visit from 05/22/2023 in [facility] at [place] Video Visit from 01/14/2023 in [facility] at [place] Video Visit from 08/13/2022 in [facility] at [place] Video Visit from 05/24/2022 in [facility] at [place] Video Visit from 03/23/2022 in [facility] at [facility] Total Score 6 2 1  0 0  PHQ-9 Total Score 18 6 -- -- --      Flowsheet Row Office Visit from 05/22/2023 in [facility] at [place] Admission (Discharged) from 03/27/2023  in [facility] Video Visit from 01/14/2023 in [facility] at [facility] RISK CATEGORY Error: Question 6 not populated No Risk No Risk        Assessment and Plan: This patient is a 57 year old female with a 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  She has gotten a lot more depressed and anxious due to many circumstantial factors including her health as well as financial issues.  For now she will continue Zoloft 150 mg daily for depression and anxiety but add mirtazapine 15 mg at bedtime for depression anxiety and appetite.  She will return to lorazepam 0.5 mg 3 times daily for anxiety.  She will return to see me in 4 to 5 weeks.  Collaboration of Care: Collaboration of Care: Referral or follow-up with counselor/therapist AEB patient will start therapy with Florencia Reasons in our office  Patient/Guardian was advised Release of Information must be obtained prior to any record release in order to collaborate their care with an outside provider. Patient/Guardian was advised if they have not already done so to contact the registration department to sign all necessary forms in order for [place] to release information regarding their care.   Consent: Patient/Guardian gives verbal consent for treatment and assignment of benefits for services provided during this visit. Patient/Guardian expressed understanding and agreed to proceed.    [name], MD 05/22/2023, 10:16 AM

## 2023-05-23 ENCOUNTER — Inpatient Hospital Stay: Payer: BC Managed Care – PPO

## 2023-05-29 ENCOUNTER — Other Ambulatory Visit: Payer: Self-pay

## 2023-05-29 DIAGNOSIS — D45 Polycythemia vera: Secondary | ICD-10-CM

## 2023-05-30 ENCOUNTER — Inpatient Hospital Stay: Payer: BC Managed Care – PPO | Attending: Physician Assistant

## 2023-05-30 ENCOUNTER — Inpatient Hospital Stay: Payer: BC Managed Care – PPO

## 2023-05-30 DIAGNOSIS — D45 Polycythemia vera: Secondary | ICD-10-CM | POA: Diagnosis present

## 2023-05-30 LAB — CBC
HCT: 39.2 % (ref 36.0–46.0)
Hemoglobin: 12.9 g/dL (ref 12.0–15.0)
MCH: 30.8 pg (ref 26.0–34.0)
MCHC: 32.9 g/dL (ref 30.0–36.0)
MCV: 93.6 fL (ref 80.0–100.0)
Platelets: 189 10*3/uL (ref 150–400)
RBC: 4.19 MIL/uL (ref 3.87–5.11)
RDW: 13 % (ref 11.5–15.5)
WBC: 5.9 10*3/uL (ref 4.0–10.5)
nRBC: 0 % (ref 0.0–0.2)

## 2023-05-30 NOTE — Progress Notes (Signed)
HGB 12.9 and HCT 39.2. Reported HCT to R. Pennington PA. Message received to discharge patient home. NO phlebotomy needed today. Discharged from clinic ambulatory in stable condition. Alert and oriented x 3. F/U with [facility] as scheduled.

## 2023-05-30 NOTE — Progress Notes (Signed)
Called patient to inform her she did not need her phlebotomy today per patient's request to call. No answer on her cell. Left message on her cell phone.

## 2023-05-31 ENCOUNTER — Telehealth (HOSPITAL_COMMUNITY): Payer: BC Managed Care – PPO | Admitting: Psychiatry

## 2023-06-03 ENCOUNTER — Ambulatory Visit (INDEPENDENT_AMBULATORY_CARE_PROVIDER_SITE_OTHER): Payer: BC Managed Care – PPO | Admitting: Gastroenterology

## 2023-06-06 ENCOUNTER — Ambulatory Visit: Payer: BC Managed Care – PPO | Admitting: General Surgery

## 2023-06-20 ENCOUNTER — Telehealth (HOSPITAL_COMMUNITY): Payer: BC Managed Care – PPO | Admitting: Psychiatry

## 2023-06-27 ENCOUNTER — Ambulatory Visit: Payer: BC Managed Care – PPO | Admitting: General Surgery

## 2023-07-04 ENCOUNTER — Other Ambulatory Visit (HOSPITAL_COMMUNITY)
Admission: RE | Admit: 2023-07-04 | Discharge: 2023-07-04 | Disposition: A | Discharge status: Home / Self Care | Payer: BC Managed Care – PPO | Source: Ambulatory Visit | Attending: Family Medicine | Admitting: Family Medicine

## 2023-07-04 ENCOUNTER — Ambulatory Visit: Payer: BC Managed Care – PPO | Admitting: General Surgery

## 2023-07-04 ENCOUNTER — Telehealth (INDEPENDENT_AMBULATORY_CARE_PROVIDER_SITE_OTHER): Payer: BC Managed Care – PPO | Admitting: Psychiatry

## 2023-07-04 ENCOUNTER — Encounter (HOSPITAL_COMMUNITY): Payer: Self-pay | Admitting: Psychiatry

## 2023-07-04 DIAGNOSIS — D45 Polycythemia vera: Secondary | ICD-10-CM | POA: Diagnosis not present

## 2023-07-04 DIAGNOSIS — F429 Obsessive-compulsive disorder, unspecified: Secondary | ICD-10-CM | POA: Diagnosis not present

## 2023-07-04 DIAGNOSIS — F419 Anxiety disorder, unspecified: Secondary | ICD-10-CM | POA: Diagnosis not present

## 2023-07-04 DIAGNOSIS — F5105 Insomnia due to other mental disorder: Secondary | ICD-10-CM | POA: Diagnosis not present

## 2023-07-04 DIAGNOSIS — R5383 Other fatigue: Secondary | ICD-10-CM | POA: Insufficient documentation

## 2023-07-04 DIAGNOSIS — F321 Major depressive disorder, single episode, moderate: Secondary | ICD-10-CM | POA: Diagnosis not present

## 2023-07-04 LAB — CBC WITH DIFFERENTIAL/PLATELET
Abs Immature Granulocytes: 0.02 10*3/uL (ref 0.00–0.07)
Basophils Absolute: 0 10*3/uL (ref 0.0–0.1)
Basophils Relative: 1 %
Eosinophils Absolute: 0.1 10*3/uL (ref 0.0–0.5)
Eosinophils Relative: 2 %
HCT: 39.5 % (ref 36.0–46.0)
Hemoglobin: 12.9 g/dL (ref 12.0–15.0)
Immature Granulocytes: 0 %
Lymphocytes Relative: 26 %
Lymphs Abs: 1.5 10*3/uL (ref 0.7–4.0)
MCH: 31.2 pg (ref 26.0–34.0)
MCHC: 32.7 g/dL (ref 30.0–36.0)
MCV: 95.6 fL (ref 80.0–100.0)
Monocytes Absolute: 0.4 10*3/uL (ref 0.1–1.0)
Monocytes Relative: 7 %
Neutro Abs: 3.7 10*3/uL (ref 1.7–7.7)
Neutrophils Relative %: 64 %
Platelets: 177 10*3/uL (ref 150–400)
RBC: 4.13 MIL/uL (ref 3.87–5.11)
RDW: 13 % (ref 11.5–15.5)
WBC: 5.8 10*3/uL (ref 4.0–10.5)
nRBC: 0 % (ref 0.0–0.2)

## 2023-07-04 LAB — VITAMIN D 25 HYDROXY (VIT D DEFICIENCY, FRACTURES): Vit D, 25-Hydroxy: 20.61 ng/mL — ABNORMAL LOW (ref 30–100)

## 2023-07-04 LAB — VITAMIN B12: Vitamin B-12: 100 pg/mL — ABNORMAL LOW (ref 180–914)

## 2023-07-04 LAB — COMPREHENSIVE METABOLIC PANEL
ALT: 9 U/L (ref 0–44)
AST: 11 U/L — ABNORMAL LOW (ref 15–41)
Albumin: 4 g/dL (ref 3.5–5.0)
Alkaline Phosphatase: 60 U/L (ref 38–126)
Anion gap: 7 (ref 5–15)
BUN: <5 mg/dL — ABNORMAL LOW (ref 6–20)
CO2: 28 mmol/L (ref 22–32)
Calcium: 8.7 mg/dL — ABNORMAL LOW (ref 8.9–10.3)
Chloride: 105 mmol/L (ref 98–111)
Creatinine, Ser: 0.83 mg/dL (ref 0.44–1.00)
GFR, Estimated: >60 mL/min (ref >60)
Glucose, Bld: 119 mg/dL — ABNORMAL HIGH (ref 70–99)
Potassium: 2.8 mmol/L — ABNORMAL LOW (ref 3.5–5.1)
Sodium: 140 mmol/L (ref 135–145)
Total Bilirubin: 0.6 mg/dL (ref 0.3–1.2)
Total Protein: 6.6 g/dL (ref 6.5–8.1)

## 2023-07-04 LAB — TSH: TSH: 2.387 u[IU]/mL (ref 0.350–4.500)

## 2023-07-04 MED ORDER — SERTRALINE HCL 100 MG PO TABS
ORAL_TABLET | ORAL | 3 refills | Status: DC
Start: 2023-07-04 — End: 2023-08-27

## 2023-07-04 MED ORDER — TRAZODONE HCL 50 MG PO TABS
ORAL_TABLET | ORAL | 3 refills | Status: DC
Start: 2023-07-04 — End: 2023-08-27

## 2023-07-04 MED ORDER — CLONAZEPAM 0.5 MG PO TABS: 0.5000 mg | ORAL_TABLET | Freq: Three times a day (TID) | ORAL | 2 refills | Status: DC | PRN

## 2023-07-04 NOTE — Progress Notes (Signed)
Virtual Visit via Video Note  I connected with Cheryl Cross on 07/04/23 at  3:20 PM EDT by a video enabled telemedicine application and verified that I am speaking with the correct person using two identifiers.  Location: Patient: home Provider: office   I discussed the limitations of evaluation and management by telemedicine and the availability of in person appointments. The patient expressed understanding and agreed to proceed.     I discussed the assessment and treatment plan with the patient. The patient was provided an opportunity to ask questions and all were answered. The patient agreed with the plan and demonstrated an understanding of the instructions.   The patient was advised to call back or seek an in-person evaluation if the symptoms worsen or if the condition fails to improve as anticipated.  I provided 15 minutes of non-face-to-face time during this encounter.   [name], MD  [facility] MD/PA/NP OP Progress Note  07/04/2023 3:30 PM Cheryl Cross  MRN:  [id number]  Chief Complaint:  Chief Complaint  Patient presents with   Anxiety   Depression   Follow-up   HPI: Patient is a 57 year old widowed white female who lives alone in [place]. 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  Last time she was very stressed because her Social Security had been cut and she was taking care of her elderly sick parents.  She is having a lot of GI issues and had lost a great deal of weight.  She states that she is eating a little bit better and has gained 5 pounds.  However she was at her PCP today and has low B12 and vitamin D.  These are going to have to be replaced.  She does not think the Ativan is working as well as the clonazepam for anxiety.  Also mirtazapine is giving her nightmares and she has returned to the trazodone.  Right now  her mood is fairly stable.  Visit Diagnosis:    ICD-10-CM   1. Major depressive disorder, single episode, moderate (HCC)  F32.1 sertraline (ZOLOFT) 100 MG tablet    2. Obsessive-compulsive disorder, unspecified type  F42.9 sertraline (ZOLOFT) 100 MG tablet    3. Insomnia secondary to depression with anxiety  F51.05 traZODone (DESYREL) 50 MG tablet   F41.8       Past Psychiatric History: Psychiatric hospitalization 2013, otherwise outpatient treatment  Past Medical History:  Past Medical History:  Diagnosis Date   Abdominal pain, right upper quadrant 06/10/2018   Anxiety    Arthritis    Bursitis    DDD (degenerative disc disease)    Depression    Fibromyalgia    Nausea and vomiting 02/26/2023   Obsessive-compulsive disorder    Overactive bladder    Polycythemia vera(238.4) January 2013   PTSD (post-traumatic stress disorder)    PVC's (premature ventricular contractions)    pt. placed on heart monitor x 24hours, everything fine    Sesamoiditis February 2017    Past Surgical History:  Procedure Laterality Date   ABDOMINAL HYSTERECTOMY     BIOPSY  01/04/2021   Procedure: BIOPSY;  Surgeon: Dolores Frame, MD;  [facility];  Service: Gastroenterology;;  small bowel biopsy   BIOPSY  03/27/2023   Procedure: BIOPSY;  Surgeon: Dolores Frame, MD;  [facility];  Service: Gastroenterology;;   COLONOSCOPY WITH  PROPOFOL N/A 01/04/2021   Procedure: COLONOSCOPY WITH PROPOFOL;  Surgeon: Dolores Frame, MD;  [facility];  Service: Gastroenterology;  Laterality: N/A;  1115   ESOPHAGOGASTRODUODENOSCOPY N/A 09/10/2014   Procedure: ESOPHAGOGASTRODUODENOSCOPY (EGD);  Surgeon: Malissa Hippo, MD;  [facility];  Service: Endoscopy;  Laterality: N/A;  130 - moved to 3:15 - Ann to notify   ESOPHAGOGASTRODUODENOSCOPY (EGD) WITH PROPOFOL N/A 01/04/2021   Procedure: ESOPHAGOGASTRODUODENOSCOPY (EGD) WITH PROPOFOL;  Surgeon:  Dolores Frame, MD;  [facility];  Service: Gastroenterology;  Laterality: N/A;   ESOPHAGOGASTRODUODENOSCOPY (EGD) WITH PROPOFOL N/A 03/27/2023   Procedure: ESOPHAGOGASTRODUODENOSCOPY (EGD) WITH PROPOFOL;  Surgeon: Dolores Frame, MD;  [facility];  Service: Gastroenterology;  Laterality: N/A;  9:00am;asa 2   LUMBAR FUSION     POLYPECTOMY  01/04/2021   Procedure: POLYPECTOMY INTESTINAL;  Surgeon: Dolores Frame, MD;  [facility];  Service: Gastroenterology;;  cecal lesion; ascending colon polyp;    TOOTH EXTRACTION Left Aug 2016    Family Psychiatric History: See below  Family History:  Family History  Problem Relation Age of Onset   Cancer - Colon Mother        age 54   Anxiety disorder Mother    Diabetes Mother    Atrial fibrillation Father    Lung cancer Father    Atrial fibrillation Brother    Bipolar disorder Neg Hx    Dementia Neg Hx    Drug abuse Neg Hx    Paranoid behavior Neg Hx    Schizophrenia Neg Hx    Seizures Neg Hx    Sexual abuse Neg Hx    Physical abuse Neg Hx     Social History:  Social History   Socioeconomic History   Marital status: Widowed    Spouse name: Not on file   Number of children: 0   Years of education: BA   Highest education level: Not on file  Occupational History   Occupation: [employer]- on leave (since 08/2020)  Tobacco Use   Smoking status: Every Day    Current packs/day: 1.00    Average packs/day: 1 pack/day for 26.6 years (26.6 ttl pk-yrs)    Types: Cigarettes    Start date: 11/12/1996    Passive exposure: Current   Smokeless tobacco: Never   Tobacco comments:    1 pack a day x 20 yrs.  Vaping Use   Vaping status: Never Used  Substance and Sexual Activity   Alcohol use: No    Alcohol/week: 0.0 standard drinks of alcohol   Drug use: No    Types: Hydrocodone, Benzodiazepines   Sexual activity: Not on file  Other Topics Concern   Not on  file  Social History Narrative   Right handed   2-3 glasses soda per day   Lives alone   Social Determinants of Health   Financial Resource Strain: Not on file  Food Insecurity: Not on file  Transportation Needs: Not on file  Physical Activity: Not on file  Stress: Not on file  Social Connections: Not on file    Allergies:  Allergies  Allergen Reactions   Gabapentin Hives   Hydrocodone-Acetaminophen Nausea Only   Penicillins Other (See Comments)    Unknown child hood reaction .Did it involve swelling of the face/tongue/throat, SOB, or low BP? Unknown Did it involve sudden or severe rash/hives, skin peeling, or any reaction on the inside of your mouth or nose? Unknown Did  you need to seek medical attention at a hospital or doctor's office? Unknown When did it last happen?       If all above answers are "NO", may proceed with cephalosporin use.     Metabolic Disorder Labs: No results found for: "HGBA1C", "MPG" No results found for: "PROLACTIN" No results found for: "CHOL", "TRIG", "HDL", "CHOLHDL", "VLDL", "LDLCALC" Lab Results  Component Value Date   TSH 2.387 07/04/2023   TSH 1.770 10/13/2020    Therapeutic Level Labs: No results found for: "LITHIUM" No results found for: "VALPROATE" No results found for: "CBMZ"  Current Medications: Current Outpatient Medications  Medication Sig Dispense Refill   aspirin 81 MG tablet Take 1 tablet (81 mg total) by mouth daily. For Polycythemia Vera.     clonazePAM (KLONOPIN) 0.5 MG tablet Take 1 tablet (0.5 mg total) by mouth 3 (three) times daily as needed for anxiety. 90 tablet 2   diclofenac Sodium (VOLTAREN) 1 % GEL Apply 1 Application topically 4 (four) times daily as needed (pain. (applied to foot area)).     methocarbamol (ROBAXIN) 500 MG tablet Take 250 mg by mouth every 8 (eight) hours as needed for muscle spasms.     pantoprazole (PROTONIX) 40 MG tablet Take 40 mg by mouth daily before breakfast.     prochlorperazine  (COMPAZINE) 5 MG tablet Take 5-10 mg by mouth every 6 (six) hours as needed for vomiting or nausea.     sertraline (ZOLOFT) 100 MG tablet TAKE 1 AND 1/2 TABLETS(150 MG) BY MOUTH DAILY 45 tablet 3   traMADol (ULTRAM) 50 MG tablet Take 1 tablet (50 mg total) by mouth daily as needed for severe pain. 30 tablet 0   traZODone (DESYREL) 50 MG tablet TAKE 1 TABLET(50 MG) BY MOUTH AT BEDTIME 30 tablet 3   No current facility-administered medications for this visit.     Musculoskeletal: Strength & Muscle Tone: within normal limits Gait & Station: normal Patient leans: N/A  Psychiatric Specialty Exam: Review of Systems  Constitutional:  Positive for fatigue.  Eyes:  Positive for visual disturbance.  Gastrointestinal:  Positive for abdominal pain and nausea.  Psychiatric/Behavioral:  The patient is nervous/anxious.   All other systems reviewed and are negative.   There were no vitals taken for this visit.There is no height or weight on file to calculate BMI.  General Appearance: Casual and Fairly Groomed  Eye Contact:  Good  Speech:  Clear and Coherent  Volume:  Normal  Mood:  Anxious  Affect:  Congruent  Thought Process:  Goal Directed  Orientation:  Full (Time, Place, and Person)  Thought Content: Rumination   Suicidal Thoughts:  No  Homicidal Thoughts:  No  Memory:  Immediate;   Good Recent;   Good Remote;   Good  Judgement:  Good  Insight:  Fair  Psychomotor Activity:  Decreased  Concentration:  Concentration: Good and Attention Span: Good  Recall:  Good  Fund of Knowledge: Good  Language: Good  Akathisia:  No  Handed:  Right  AIMS (if indicated): not done  Assets:  Communication Skills Desire for Improvement Resilience Social Support Talents/Skills  ADL's:  Intact  Cognition: WNL  Sleep:  Good   Screenings: GAD-7    Flowsheet Row Office Visit from 05/22/2023 in [facility] at [place] Counselor from 01/13/2021 in [facility] at [facility]  Total GAD-7 Score 21 17      PHQ2-9    Flowsheet Row Office Visit from 05/22/2023  in [facility] at [place] Video Visit from 01/14/2023 in [facility] at [place] Video Visit from 08/13/2022 in [facility] Outpatient [facility]al [facility] at [place] Video Visit from 05/24/2022 in [facility] at [place] Video Visit from 03/23/2022 in [facility] at [facility] Total Score 6 2 1  0 0  PHQ-9 Total Score 18 6 -- -- --      Flowsheet Row Office Visit from 05/22/2023 in [facility] at [place] Admission (Discharged) from 03/27/2023 in [facility] Video Visit from 01/14/2023 in [facility] at [facility] RISK CATEGORY Error: Question 6 not populated No Risk No Risk        Assessment and Plan: This patient is a 57 year old female with a history of substance abuse in remission, depression insomnia and severe visual impairment.  She has gotten more stressed due to many circumstantial factors.  For now she will continue Zoloft 150 mg daily for depression.  The lorazepam was not helping with the anxiety so we will switch to clonazepam 0.5 mg 3 times daily for anxiety.  The trazodone 50 mg works better for her sleep so she will continue the 50 mg at bedtime.  She will return to see me in 2 months  Collaboration of Care: Collaboration of Care: Referral or follow-up with counselor/therapist AEB patient has been scheduled for therapy with Florencia Reasons in our office  Patient/Guardian was advised Release of Information must be obtained prior to any record release in order to collaborate their care with an outside provider. Patient/Guardian was advised if they have not already done so to contact the registration department to sign all necessary forms in order for [place] to release information  regarding their care.   Consent: Patient/Guardian gives verbal consent for treatment and assignment of benefits for services provided during this visit. Patient/Guardian expressed understanding and agreed to proceed.    [name], MD 07/04/2023, 3:30 PM

## 2023-07-08 ENCOUNTER — Telehealth (HOSPITAL_COMMUNITY): Payer: Self-pay | Admitting: *Deleted

## 2023-07-08 NOTE — Telephone Encounter (Signed)
[employer] called stating that they are needing All Treatment Dates with in the Total Disability Dates patient have been disabled. Per [employer], these dates must have the office letter head on the top and faxed back to [phone].

## 2023-07-08 NOTE — Telephone Encounter (Signed)
Patient brought in forms that is needing to be completed by provider.

## 2023-07-08 NOTE — Telephone Encounter (Signed)
Cheryl Cross is aware and should be working on this. I can't provide this

## 2023-07-19 ENCOUNTER — Telehealth (HOSPITAL_COMMUNITY): Payer: Self-pay

## 2023-07-19 NOTE — Telephone Encounter (Signed)
Called pt to confirm 07/23/23 appt no answer left vm

## 2023-07-23 ENCOUNTER — Ambulatory Visit (HOSPITAL_COMMUNITY): Payer: BC Managed Care – PPO | Admitting: Psychiatry

## 2023-08-06 ENCOUNTER — Ambulatory Visit (INDEPENDENT_AMBULATORY_CARE_PROVIDER_SITE_OTHER): Payer: BC Managed Care – PPO | Admitting: Gastroenterology

## 2023-08-21 ENCOUNTER — Other Ambulatory Visit: Payer: Self-pay

## 2023-08-21 DIAGNOSIS — D45 Polycythemia vera: Secondary | ICD-10-CM

## 2023-08-21 NOTE — Progress Notes (Signed)
Lab orders entered

## 2023-08-22 ENCOUNTER — Inpatient Hospital Stay: Payer: BC Managed Care – PPO

## 2023-08-22 ENCOUNTER — Other Ambulatory Visit: Payer: BC Managed Care – PPO

## 2023-08-23 ENCOUNTER — Other Ambulatory Visit: Payer: BC Managed Care – PPO

## 2023-08-26 ENCOUNTER — Inpatient Hospital Stay: Payer: BC Managed Care – PPO

## 2023-08-27 ENCOUNTER — Inpatient Hospital Stay: Payer: BC Managed Care – PPO | Attending: Physician Assistant

## 2023-08-27 ENCOUNTER — Encounter (HOSPITAL_COMMUNITY): Payer: Self-pay | Admitting: Psychiatry

## 2023-08-27 ENCOUNTER — Telehealth (INDEPENDENT_AMBULATORY_CARE_PROVIDER_SITE_OTHER): Payer: BC Managed Care – PPO | Admitting: Psychiatry

## 2023-08-27 DIAGNOSIS — F321 Major depressive disorder, single episode, moderate: Secondary | ICD-10-CM

## 2023-08-27 DIAGNOSIS — F1911 Other psychoactive substance abuse, in remission: Secondary | ICD-10-CM

## 2023-08-27 DIAGNOSIS — F5105 Insomnia due to other mental disorder: Secondary | ICD-10-CM

## 2023-08-27 DIAGNOSIS — D45 Polycythemia vera: Secondary | ICD-10-CM | POA: Diagnosis present

## 2023-08-27 DIAGNOSIS — F418 Other specified anxiety disorders: Secondary | ICD-10-CM

## 2023-08-27 DIAGNOSIS — F429 Obsessive-compulsive disorder, unspecified: Secondary | ICD-10-CM | POA: Diagnosis not present

## 2023-08-27 LAB — CBC WITH DIFFERENTIAL/PLATELET
Abs Immature Granulocytes: 0.01 10*3/uL (ref 0.00–0.07)
Basophils Absolute: 0 10*3/uL (ref 0.0–0.1)
Basophils Relative: 1 %
Eosinophils Absolute: 0.1 10*3/uL (ref 0.0–0.5)
Eosinophils Relative: 3 %
HCT: 40.6 % (ref 36.0–46.0)
Hemoglobin: 13.7 g/dL (ref 12.0–15.0)
Immature Granulocytes: 0 %
Lymphocytes Relative: 36 %
Lymphs Abs: 2 10*3/uL (ref 0.7–4.0)
MCH: 31.6 pg (ref 26.0–34.0)
MCHC: 33.7 g/dL (ref 30.0–36.0)
MCV: 93.5 fL (ref 80.0–100.0)
Monocytes Absolute: 0.4 10*3/uL (ref 0.1–1.0)
Monocytes Relative: 8 %
Neutro Abs: 3 10*3/uL (ref 1.7–7.7)
Neutrophils Relative %: 52 %
Platelets: 216 10*3/uL (ref 150–400)
RBC: 4.34 MIL/uL (ref 3.87–5.11)
RDW: 12.5 % (ref 11.5–15.5)
WBC: 5.6 10*3/uL (ref 4.0–10.5)
nRBC: 0 % (ref 0.0–0.2)

## 2023-08-27 LAB — COMPREHENSIVE METABOLIC PANEL
ALT: 10 U/L (ref 0–44)
AST: 10 U/L — ABNORMAL LOW (ref 15–41)
Albumin: 4.2 g/dL (ref 3.5–5.0)
Alkaline Phosphatase: 58 U/L (ref 38–126)
Anion gap: 8 (ref 5–15)
BUN: 5 mg/dL — ABNORMAL LOW (ref 6–20)
CO2: 28 mmol/L (ref 22–32)
Calcium: 8.6 mg/dL — ABNORMAL LOW (ref 8.9–10.3)
Chloride: 102 mmol/L (ref 98–111)
Creatinine, Ser: 0.78 mg/dL (ref 0.44–1.00)
GFR, Estimated: >60 mL/min (ref >60)
Glucose, Bld: 83 mg/dL (ref 70–99)
Potassium: 3 mmol/L — ABNORMAL LOW (ref 3.5–5.1)
Sodium: 138 mmol/L (ref 135–145)
Total Bilirubin: 0.6 mg/dL (ref 0.3–1.2)
Total Protein: 6.6 g/dL (ref 6.5–8.1)

## 2023-08-27 LAB — LACTATE DEHYDROGENASE: LDH: 81 U/L — ABNORMAL LOW (ref 98–192)

## 2023-08-27 MED ORDER — SERTRALINE HCL 100 MG PO TABS
ORAL_TABLET | ORAL | 3 refills | Status: DC
Start: 2023-08-27 — End: 2023-10-29

## 2023-08-27 MED ORDER — CLONAZEPAM 0.5 MG PO TABS: 0.5000 mg | ORAL_TABLET | Freq: Four times a day (QID) | ORAL | 2 refills | Status: DC | PRN

## 2023-08-27 MED ORDER — TRAZODONE HCL 50 MG PO TABS
ORAL_TABLET | ORAL | 3 refills | Status: DC
Start: 2023-08-27 — End: 2024-01-21

## 2023-08-27 NOTE — Progress Notes (Signed)
Virtual Visit via Video Note  I connected with Jarrett Chicoine Pierrelouis on 08/27/23 at  3:40 PM EDT by a video enabled telemedicine application and verified that I am speaking with the correct person using two identifiers.  Location: Patient: home Provider: office   I discussed the limitations of evaluation and management by telemedicine and the availability of in person appointments. The patient expressed understanding and agreed to proceed.     I discussed the assessment and treatment plan with the patient. The patient was provided an opportunity to ask questions and all were answered. The patient agreed with the plan and demonstrated an understanding of the instructions.   The patient was advised to call back or seek an in-person evaluation if the symptoms worsen or if the condition fails to improve as anticipated.  I provided 20 minutes of non-face-to-face time during this encounter.   [name], MD  [facility] MD/PA/NP OP Progress Note  08/27/2023 4:04 PM SABEEN PIECHOCKI  MRN:  [id number]  Chief Complaint:  Chief Complaint  Patient presents with   Anxiety   Depression   Follow-up   HPI: Patient is a 57 year old widowed white female who lives alone in [place]. 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  She still under a lot of stress.  Her visual problems have gotten to the point where she had to give up her driver's license which was quite depressing.  She is also worried about her father who is now on hospice care.  She states that there is really nothing positive going on in her life other than spending time with her great niece.  However she denies being suicidal.  The Klonopin is helping anxiety but sometimes she has these panic attacks out of the blue and she asked if she can have 1 extra to take per day and I think this is reasonable.   Most the time she is sleeping well and her appetite is variable.  She claims she has not lost any more weight.  She has to take B12 and vitamin D.  I noted her labs today show that her potassium is also low.  She is going to talk to her PCP about this. Visit Diagnosis:    ICD-10-CM   1. Major depressive disorder, single episode, moderate (HCC)  F32.1 sertraline (ZOLOFT) 100 MG tablet    2. Obsessive-compulsive disorder, unspecified type  F42.9 sertraline (ZOLOFT) 100 MG tablet    3. Insomnia secondary to depression with anxiety  F51.05 traZODone (DESYREL) 50 MG tablet   F41.8       Past Psychiatric History: Past psychiatric hospitalization in 2013, otherwise outpatient treatment  Past Medical History:  Past Medical History:  Diagnosis Date   Abdominal pain, right upper quadrant 06/10/2018   Anxiety    Arthritis    Bursitis    DDD (degenerative disc disease)    Depression    Fibromyalgia    Nausea and vomiting 02/26/2023   Obsessive-compulsive disorder    Overactive bladder    Polycythemia vera(238.4) January 2013   PTSD (post-traumatic stress disorder)    PVC's (premature ventricular contractions)    pt. placed on heart monitor x 24hours, everything fine    Sesamoiditis February 2017    Past Surgical History:  Procedure Laterality Date   ABDOMINAL HYSTERECTOMY     BIOPSY  01/04/2021  Procedure: BIOPSY;  Surgeon: Dolores Frame, MD;  [facility];  Service: Gastroenterology;;  small bowel biopsy   BIOPSY  03/27/2023   Procedure: BIOPSY;  Surgeon: Dolores Frame, MD;  [facility];  Service: Gastroenterology;;   COLONOSCOPY WITH PROPOFOL N/A 01/04/2021   Procedure: COLONOSCOPY WITH PROPOFOL;  Surgeon: Dolores Frame, MD;  [facility];  Service: Gastroenterology;  Laterality: N/A;  1115   ESOPHAGOGASTRODUODENOSCOPY N/A 09/10/2014   Procedure: ESOPHAGOGASTRODUODENOSCOPY (EGD);  Surgeon: Malissa Hippo, MD;   [facility];  Service: Endoscopy;  Laterality: N/A;  130 - moved to 3:15 - Ann to notify   ESOPHAGOGASTRODUODENOSCOPY (EGD) WITH PROPOFOL N/A 01/04/2021   Procedure: ESOPHAGOGASTRODUODENOSCOPY (EGD) WITH PROPOFOL;  Surgeon: Dolores Frame, MD;  [facility];  Service: Gastroenterology;  Laterality: N/A;   ESOPHAGOGASTRODUODENOSCOPY (EGD) WITH PROPOFOL N/A 03/27/2023   Procedure: ESOPHAGOGASTRODUODENOSCOPY (EGD) WITH PROPOFOL;  Surgeon: Dolores Frame, MD;  [facility];  Service: Gastroenterology;  Laterality: N/A;  9:00am;asa 2   LUMBAR FUSION     POLYPECTOMY  01/04/2021   Procedure: POLYPECTOMY INTESTINAL;  Surgeon: Dolores Frame, MD;  [facility];  Service: Gastroenterology;;  cecal lesion; ascending colon polyp;    TOOTH EXTRACTION Left Aug 2016    Family Psychiatric History: See below  Family History:  Family History  Problem Relation Age of Onset   Cancer - Colon Mother        age 66   Anxiety disorder Mother    Diabetes Mother    Atrial fibrillation Father    Lung cancer Father    Atrial fibrillation Brother    Bipolar disorder Neg Hx    Dementia Neg Hx    Drug abuse Neg Hx    Paranoid behavior Neg Hx    Schizophrenia Neg Hx    Seizures Neg Hx    Sexual abuse Neg Hx    Physical abuse Neg Hx     Social History:  Social History   Socioeconomic History   Marital status: Widowed    Spouse name: Not on file   Number of children: 0   Years of education: BA   Highest education level: Not on file  Occupational History   Occupation: [employer]- on leave (since 08/2020)  Tobacco Use   Smoking status: Every Day    Current packs/day: 1.00    Average packs/day: 1 pack/day for 26.8 years (26.8 ttl pk-yrs)    Types: Cigarettes    Start date: 11/12/1996    Passive exposure: Current   Smokeless tobacco: Never   Tobacco comments:    1 pack a day x 20 yrs.  Vaping Use   Vaping  status: Never Used  Substance and Sexual Activity   Alcohol use: No    Alcohol/week: 0.0 standard drinks of alcohol   Drug use: No    Types: Hydrocodone, Benzodiazepines   Sexual activity: Not on file  Other Topics Concern   Not on file  Social History Narrative   Right handed   2-3 glasses soda per day   Lives alone   Social Determinants of Health   Financial Resource Strain: Not on file  Food Insecurity: Not on file  Transportation Needs: Not on file  Physical Activity: Not on file  Stress: Not on file  Social Connections: Not on file    Allergies:  Allergies  Allergen Reactions   Gabapentin Hives   Hydrocodone-Acetaminophen Nausea Only  Penicillins Other (See Comments)    Unknown child hood reaction .Did it involve swelling of the face/tongue/throat, SOB, or low BP? Unknown Did it involve sudden or severe rash/hives, skin peeling, or any reaction on the inside of your mouth or nose? Unknown Did you need to seek medical attention at a hospital or doctor's office? Unknown When did it last happen?       If all above answers are "NO", may proceed with cephalosporin use.     Metabolic Disorder Labs: No results found for: "HGBA1C", "MPG" No results found for: "PROLACTIN" No results found for: "CHOL", "TRIG", "HDL", "CHOLHDL", "VLDL", "LDLCALC" Lab Results  Component Value Date   TSH 2.387 07/04/2023   TSH 1.770 10/13/2020    Therapeutic Level Labs: No results found for: "LITHIUM" No results found for: "VALPROATE" No results found for: "CBMZ"  Current Medications: Current Outpatient Medications  Medication Sig Dispense Refill   aspirin 81 MG tablet Take 1 tablet (81 mg total) by mouth daily. For Polycythemia Vera.     clonazePAM (KLONOPIN) 0.5 MG tablet Take 1 tablet (0.5 mg total) by mouth 4 (four) times daily as needed for anxiety. 120 tablet 2   diclofenac Sodium (VOLTAREN) 1 % GEL Apply 1 Application topically 4 (four) times daily as needed (pain. (applied  to foot area)).     methocarbamol (ROBAXIN) 500 MG tablet Take 250 mg by mouth every 8 (eight) hours as needed for muscle spasms.     pantoprazole (PROTONIX) 40 MG tablet Take 40 mg by mouth daily before breakfast.     prochlorperazine (COMPAZINE) 5 MG tablet Take 5-10 mg by mouth every 6 (six) hours as needed for vomiting or nausea.     sertraline (ZOLOFT) 100 MG tablet TAKE 1 AND 1/2 TABLETS(150 MG) BY MOUTH DAILY 45 tablet 3   traMADol (ULTRAM) 50 MG tablet Take 1 tablet (50 mg total) by mouth daily as needed for severe pain. 30 tablet 0   traZODone (DESYREL) 50 MG tablet TAKE 1 TABLET(50 MG) BY MOUTH AT BEDTIME 30 tablet 3   No current facility-administered medications for this visit.     Musculoskeletal: Strength & Muscle Tone: within normal limits Gait & Station: normal Patient leans: N/A  Psychiatric Specialty Exam: Review of Systems  Eyes:  Positive for visual disturbance.  Psychiatric/Behavioral:  The patient is nervous/anxious.   All other systems reviewed and are negative.   There were no vitals taken for this visit.There is no height or weight on file to calculate BMI.  General Appearance: Casual and Fairly Groomed  Eye Contact:  Good  Speech:  Clear and Coherent  Volume:  Normal  Mood:  Anxious  Affect:  Congruent  Thought Process:  Goal Directed  Orientation:  Full (Time, Place, and Person)  Thought Content: WDL   Suicidal Thoughts:  No  Homicidal Thoughts:  No  Memory:  Immediate;   Good Recent;   Good Remote;   Good  Judgement:  Good  Insight:  Good  Psychomotor Activity:  Decreased  Concentration:  Concentration: Good and Attention Span: Good  Recall:  Good  Fund of Knowledge: Good  Language: Good  Akathisia:  No  Handed:  Right  AIMS (if indicated): not done  Assets:  Communication Skills Desire for Improvement Resilience Social Support  ADL's:  Intact  Cognition: WNL  Sleep:  Good   Screenings: GAD-7    Flowsheet Row Office Visit from  05/22/2023 in [facility] [place] Counselor from 01/13/2021  in [facility]  Total GAD-7 Score 21 17      PHQ2-9    Flowsheet Row Office Visit from 05/22/2023 in [facility] [place] Video Visit from 01/14/2023 in [facility] [place] Video Visit from 08/13/2022 in [facility] [place] Video Visit from 05/24/2022 in [facility] at [place] Video Visit from 03/23/2022 in [facility] at [facility] Total Score 6 2 1  0 0  PHQ-9 Total Score 18 6 -- -- --      Flowsheet Row Office Visit from 05/22/2023 in [facility] [place] Ad[facility] (Discharged) from 03/27/2023 in [facility] Video Visit from 01/14/2023 in [facility] at [facility] RISK CATEGORY Error: Question 6 not populated No Risk No Risk        Assessment and Plan: This patient is a 57 year old female with a 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  Since she is having more anxiety will increase clonazepam to 0.5 mg up to 4 times daily.  Generally she only needs to take it 3 times a day but every now and then she needs an extra 1 when she has a panic attack.  She will continue Zoloft 150 mg daily for depression and trazodone 50 mg at bedtime for sleep.  She will return to see me in 2 months  Collaboration of Care: Collaboration of Care: Referral or follow-up with counselor/therapist AEB patient has been referred to therapy with Florencia Reasons in our office  Patient/Guardian was advised Release of Information must be obtained prior to any record release in order to collaborate their care with an outside provider. Patient/Guardian was advised if they have not already done so to contact the  registration department to sign all necessary forms in order for [place] to release information regarding their care.   Consent: Patient/Guardian gives verbal consent for treatment and assignment of benefits for services provided during this visit. Patient/Guardian expressed understanding and agreed to proceed.    [name], MD 08/27/2023, 4:04 PM

## 2023-08-29 ENCOUNTER — Encounter: Payer: Self-pay | Admitting: Physician Assistant

## 2023-08-29 ENCOUNTER — Inpatient Hospital Stay: Payer: BC Managed Care – PPO

## 2023-08-29 ENCOUNTER — Inpatient Hospital Stay: Payer: BC Managed Care – PPO | Admitting: Oncology

## 2023-08-29 DIAGNOSIS — E559 Vitamin D deficiency, unspecified: Secondary | ICD-10-CM | POA: Diagnosis not present

## 2023-08-29 DIAGNOSIS — R634 Abnormal weight loss: Secondary | ICD-10-CM

## 2023-08-29 DIAGNOSIS — E538 Deficiency of other specified B group vitamins: Secondary | ICD-10-CM

## 2023-08-29 DIAGNOSIS — D45 Polycythemia vera: Secondary | ICD-10-CM

## 2023-08-29 DIAGNOSIS — R7989 Other specified abnormal findings of blood chemistry: Secondary | ICD-10-CM

## 2023-08-29 DIAGNOSIS — F17210 Nicotine dependence, cigarettes, uncomplicated: Secondary | ICD-10-CM

## 2023-08-29 DIAGNOSIS — Z87891 Personal history of nicotine dependence: Secondary | ICD-10-CM

## 2023-08-29 DIAGNOSIS — E876 Hypokalemia: Secondary | ICD-10-CM

## 2023-08-29 NOTE — Progress Notes (Signed)
[facility] [place] [address], [place] [address]   CLINIC:  Medical Oncology/Hematology  PCP:  Cheryl Nevins, MD [address] [place] [address] [phone]   REASON FOR VISIT:  Follow-up for polycythemia (JAK2 negative)   CURRENT THERAPY: Intermittent phlebotomies (last phlebotomy 01/23/2023)  I connected with Cheryl Cross on 08/29/23 at  2:00 PM EDT by telephone visit and verified that I am speaking with the correct person using two identifiers.   I discussed the limitations, risks, security and privacy concerns of performing an evaluation and management service by telemedicine and the availability of in-person appointments. I also discussed with the patient that there may be a patient responsible charge related to this service. The patient expressed understanding and agreed to proceed.   Other persons participating in the visit and their role in the encounter: NP, patient    Patient's location: Home  Provider's location: Clinic   INTERVAL HISTORY:   Cheryl Cross 57 y.o. female returns for routine follow-up of her polycythemia.  She was last seen by Cheryl Brenner PA-C on 02/21/23.   She receives intermittent phlebotomies last phlebotomy was on 01/23/2023 for 500 mL were removed. Had syncopal episode following this phlebotomy.   Had a upper endoscopy on 03/27/2023 for workup for epigastric pain and nausea which revealed 3 cm hiatal hernia, erosive gastropathy with no stigmata of recent bleeding and normal duodenum.  Denies any bright red blood per rectum, melena or hematochezia.  Reports having chronic bone pain which worsens when her hemoglobin starts to rise.  Reports she thinks her labs are starting to get on the higher end of normal and believes she will need a phlebotomy in the next couple of months.  Has some new onset emotional stressors involving her dad he was just recently placed in hospice care.    She was evaluated by her PCP  recently and found to have a low B12 level and vitamin D levels.  She was started on B12 injections and given a prescription for ergocalciferol 50,000 units weekly.  States the vitamin D made her bones hurt worse and she has not taken it since.  Reports frequent diarrhea secondary to IBS-D.  Reports extreme fatigue and leg aches when hemoglobin is elevated.  Has occasional bone pain that she attributes to polycythemia.  Bone pain is random and primarily in arms and legs which feels separate from chronic back pain.  Denies any headaches.  She denies any aquagenic pruritus, changes in finger/toe coloration, dizziness, tinnitus. She denies any B symptoms such as fever, chills, unintentional weight loss.  She continues to smoke 1 pack/day cigarettes.  She has low energy and 50% appetite.  ASSESSMENT   1.  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   - She requested phlebotomy on 01/23/2023 due to hematocrit 42.5%, reports that she has worsening symptoms when hematocrit >42.0.  She had syncopal episode after phlebotomy on 01/23/2023.   - Continues to take aspirin 81 mg daily - Takes tramadol about 3 times per week for bone pains related to polycythemia   2.  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  She has been  losing weight due to poor appetite and decreased tolerance of food.    3.  Tobacco use - Current everyday smoker, 1 PPD  PLAN: 1. Polycythemia vera (HCC) - Most recent labs from 08/27/2023 show a normal CBC with hemoglobin 13.7 and hematocrit 40.6.  LDH is low at 81 and metabolic panel reveals low potassium levels 3.0 and low calcium levels 8.6.  -Recommend she continue 81 mg aspirin and tramadol 3 times per week for joint pain secondary to polycythemia. -Discussed bone marrow biopsy if significant cytopenias. - Discussed labs and telephone call in 6 weeks given hemoglobin and hematocrit have trended up although not quite high enough for phlebotomy.  Patient was agreeable. -Follow-up in 6 weeks with labs a few days before and telephone visit.  2. Loss of weight -Continue to monitor at follow-up appointments.   3. Personal history of tobacco use, presenting hazards to health - Discussed referring patient for [place] lung cancer screening program, but she wants to make sure cost is covered by her insurance first.  We will look into this and schedule in the future once confirmation has been received.  4.  Low vitamin D and B12 levels: -Vitamin D level on 07/04/2023 was 20.61 and vitamin B12 was 100. -She was started on ergocalciferol 50,000 units weekly and B12 injections.  She was unable to tolerate ergocalciferol due to worsening bone pain.  I have recommended over-the-counter D3 daily.   5.  Hypokalemia: -Incidentally found during workup with PCP. -Potassium level 2.8 with improvement to 3.0 as of 08/27/2023. -We discussed over-the-counter supplements with either 99 mcg twice daily or increasing potassium in her diet.  She would like to try over-the-counter supplements and recheck labs at next visit.   PLAN SUMMARY: >> Labs in 6 weeks (CBC/D, CMP, vitamin B12, vitamin D) a few days before with telephone call.     REVIEW OF SYSTEMS:   Review of Systems  Gastrointestinal:  Positive for  diarrhea and nausea.  Musculoskeletal:  Positive for arthralgias and myalgias.     PHYSICAL EXAM:  ECOG PERFORMANCE STATUS: 1 - Symptomatic but completely ambulatory  There were no vitals filed for this visit. There were no vitals filed for this visit. Physical Exam Neurological:     Mental Status: She is alert and oriented to person, place, and time.     PAST MEDICAL/SURGICAL HISTORY:  Past Medical History:  Diagnosis Date   Abdominal pain, right upper quadrant 06/10/2018   Anxiety    Arthritis    Bursitis    DDD (degenerative disc disease)    Depression    Fibromyalgia    Nausea and vomiting 02/26/2023   Obsessive-compulsive disorder    Overactive bladder    Polycythemia vera(238.4) January 2013   PTSD (post-traumatic stress disorder)    PVC's (premature ventricular contractions)    pt. placed on heart monitor x 24hours, everything fine    Sesamoiditis February 2017   Past Surgical History:  Procedure Laterality Date   ABDOMINAL HYSTERECTOMY     BIOPSY  01/04/2021   Procedure: BIOPSY;  Surgeon: Dolores Frame, MD;  [facility];  Service: Gastroenterology;;  small bowel biopsy   BIOPSY  03/27/2023  Procedure: BIOPSY;  Surgeon: Marguerita Merles, Reuel Boom, MD;  [facility]: [facility];  Service: Gastroenterology;;   COLONOSCOPY WITH PROPOFOL N/A 01/04/2021   Procedure: COLONOSCOPY WITH PROPOFOL;  Surgeon: Dolores Frame, MD;  [facility];  Service: Gastroenterology;  Laterality: N/A;  1115   ESOPHAGOGASTRODUODENOSCOPY N/A 09/10/2014   Procedure: ESOPHAGOGASTRODUODENOSCOPY (EGD);  Surgeon: Malissa Hippo, MD;  [facility];  Service: Endoscopy;  Laterality: N/A;  130 - moved to 3:15 - Ann to notify   ESOPHAGOGASTRODUODENOSCOPY (EGD) WITH PROPOFOL N/A 01/04/2021   Procedure: ESOPHAGOGASTRODUODENOSCOPY (EGD) WITH PROPOFOL;  Surgeon: Dolores Frame, MD;  [facility];  Service: Gastroenterology;   Laterality: N/A;   ESOPHAGOGASTRODUODENOSCOPY (EGD) WITH PROPOFOL N/A 03/27/2023   Procedure: ESOPHAGOGASTRODUODENOSCOPY (EGD) WITH PROPOFOL;  Surgeon: Dolores Frame, MD;  [facility];  Service: Gastroenterology;  Laterality: N/A;  9:00am;asa 2   LUMBAR FUSION     POLYPECTOMY  01/04/2021   Procedure: POLYPECTOMY INTESTINAL;  Surgeon: Dolores Frame, MD;  [facility];  Service: Gastroenterology;;  cecal lesion; ascending colon polyp;    TOOTH EXTRACTION Left Aug 2016    SOCIAL HISTORY:  Social History   Socioeconomic History   Marital status: Widowed    Spouse name: Not on file   Number of children: 0   Years of education: BA   Highest education level: Not on file  Occupational History   Occupation: [employer]- on leave (since 08/2020)  Tobacco Use   Smoking status: Every Day    Current packs/day: 1.00    Average packs/day: 1 pack/day for 26.8 years (26.8 ttl pk-yrs)    Types: Cigarettes    Start date: 11/12/1996    Passive exposure: Current   Smokeless tobacco: Never   Tobacco comments:    1 pack a day x 20 yrs.  Vaping Use   Vaping status: Never Used  Substance and Sexual Activity   Alcohol use: No    Alcohol/week: 0.0 standard drinks of alcohol   Drug use: No    Types: Hydrocodone, Benzodiazepines   Sexual activity: Not on file  Other Topics Concern   Not on file  Social History Narrative   Right handed   2-3 glasses soda per day   Lives alone   Social Determinants of Health   Financial Resource Strain: Not on file  Food Insecurity: Not on file  Transportation Needs: Not on file  Physical Activity: Not on file  Stress: Not on file  Social Connections: Not on file  Intimate Partner Violence: Not on file    FAMILY HISTORY:  Family History  Problem Relation Age of Onset   Cancer - Colon Mother        age 54   Anxiety disorder Mother    Diabetes Mother    Atrial fibrillation Father    Lung  cancer Father    Atrial fibrillation Brother    Bipolar disorder Neg Hx    Dementia Neg Hx    Drug abuse Neg Hx    Paranoid behavior Neg Hx    Schizophrenia Neg Hx    Seizures Neg Hx    Sexual abuse Neg Hx    Physical abuse Neg Hx     CURRENT MEDICATIONS:  Outpatient Encounter Medications as of 08/29/2023  Medication Sig Note   aspirin 81 MG tablet Take 1 tablet (81 mg total) by mouth daily. For Polycythemia Vera. 03/25/2023: On hold due to current stomach issues.  clonazePAM (KLONOPIN) 0.5 MG tablet Take 1 tablet (0.5 mg total) by mouth 4 (four) times daily as needed for anxiety.    diclofenac Sodium (VOLTAREN) 1 % GEL Apply 1 Application topically 4 (four) times daily as needed (pain. (applied to foot area)).    methocarbamol (ROBAXIN) 500 MG tablet Take 250 mg by mouth every 8 (eight) hours as needed for muscle spasms.    pantoprazole (PROTONIX) 40 MG tablet Take 40 mg by mouth daily before breakfast.    prochlorperazine (COMPAZINE) 5 MG tablet Take 5-10 mg by mouth every 6 (six) hours as needed for vomiting or nausea.    sertraline (ZOLOFT) 100 MG tablet TAKE 1 AND 1/2 TABLETS(150 MG) BY MOUTH DAILY    traMADol (ULTRAM) 50 MG tablet Take 1 tablet (50 mg total) by mouth daily as needed for severe pain.    traZODone (DESYREL) 50 MG tablet TAKE 1 TABLET(50 MG) BY MOUTH AT BEDTIME    No facility-administered encounter medications on file as of 08/29/2023.    ALLERGIES:  Allergies  Allergen Reactions   Gabapentin Hives   Hydrocodone-Acetaminophen Nausea Only   Penicillins Other (See Comments)    Unknown child hood reaction .Did it involve swelling of the face/tongue/throat, SOB, or low BP? Unknown Did it involve sudden or severe rash/hives, skin peeling, or any reaction on the inside of your mouth or nose? Unknown Did you need to seek medical attention at a hospital or doctor's office? Unknown When did it last happen?       If all above answers are "NO", may proceed with  cephalosporin use.     LABORATORY DATA:  I have reviewed the labs as listed.  CBC    Component Value Date/Time   WBC 5.6 08/27/2023 0825   RBC 4.34 08/27/2023 0825   HGB 13.7 08/27/2023 0825   HGB 12.9 06/04/2018 0920   HGB 12.8 06/05/2013 1327   HCT 40.6 08/27/2023 0825   HCT 40.2 06/04/2018 0920   HCT 38.6 06/05/2013 1327   PLT 216 08/27/2023 0825   PLT 317 06/04/2018 0920   MCV 93.5 08/27/2023 0825   MCV 93 06/04/2018 0920   MCV 89.6 06/05/2013 1327   MCH 31.6 08/27/2023 0825   MCHC 33.7 08/27/2023 0825   RDW 12.5 08/27/2023 0825   RDW 14.4 06/04/2018 0920   RDW 14.4 06/05/2013 1327   LYMPHSABS 2.0 08/27/2023 0825   LYMPHSABS 1.8 06/04/2018 0920   LYMPHSABS 2.3 06/05/2013 1327   MONOABS 0.4 08/27/2023 0825   MONOABS 0.6 06/05/2013 1327   EOSABS 0.1 08/27/2023 0825   EOSABS 0.2 06/04/2018 0920   BASOSABS 0.0 08/27/2023 0825   BASOSABS 0.0 06/04/2018 0920   BASOSABS 0.1 06/05/2013 1327      Latest Ref Rng & Units 08/27/2023    8:25 AM 07/04/2023    8:45 AM 02/11/2023    8:25 AM  CMP  Glucose 70 - 99 mg/dL 83  161  87   BUN 6 - 20 mg/dL 5  <5  6   Creatinine 0.96 - 1.00 mg/dL 0.45  4.09  8.11   Sodium 135 - 145 mmol/L 138  140  137   Potassium 3.5 - 5.1 mmol/L 3.0  2.8  3.9   Chloride 98 - 111 mmol/L 102  105  102   CO2 22 - 32 mmol/L 28  28  27    Calcium 8.9 - 10.3 mg/dL 8.6  8.7  8.7   Total Protein 6.5 - 8.1 g/dL 6.6  6.6  6.9   Total Bilirubin 0.3 - 1.2 mg/dL 0.6  0.6  0.6   Alkaline Phos 38 - 126 U/L 58  60  64   AST 15 - 41 U/L 10  11  14    ALT 0 - 44 U/L 10  9  16      DIAGNOSTIC IMAGING:  I have independently reviewed the relevant imaging and discussed with the patient.   WRAP UP:  All questions were answered. The patient knows to call the clinic with any problems, questions or concerns.  Medical decision making: Moderate  Time spent on visit: I provided 15 minutes of non face-to-face telephone visit time during this encounter, and > 50% was  spent counseling as documented under my assessment & plan.    Mauro Kaufmann, NP  02/21/2023 6:58 PM

## 2023-09-19 ENCOUNTER — Telehealth (HOSPITAL_COMMUNITY): Payer: Self-pay

## 2023-09-19 NOTE — Telephone Encounter (Signed)
Lvm to confirm 09/23/23 appt by 12:00 09/20/23

## 2023-09-23 ENCOUNTER — Ambulatory Visit (HOSPITAL_COMMUNITY): Payer: BC Managed Care – PPO | Admitting: Psychiatry

## 2023-10-08 ENCOUNTER — Encounter (HOSPITAL_COMMUNITY): Payer: Self-pay | Admitting: Psychiatry

## 2023-10-08 ENCOUNTER — Ambulatory Visit (INDEPENDENT_AMBULATORY_CARE_PROVIDER_SITE_OTHER): Payer: BC Managed Care – PPO | Admitting: Psychiatry

## 2023-10-08 DIAGNOSIS — F321 Major depressive disorder, single episode, moderate: Secondary | ICD-10-CM
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
## 2023-10-17 ENCOUNTER — Other Ambulatory Visit: Payer: Self-pay

## 2023-10-17 DIAGNOSIS — E538 Deficiency of other specified B group vitamins: Secondary | ICD-10-CM

## 2023-10-17 DIAGNOSIS — D45 Polycythemia vera: Secondary | ICD-10-CM

## 2023-10-17 DIAGNOSIS — R7989 Other specified abnormal findings of blood chemistry: Secondary | ICD-10-CM

## 2023-10-18 ENCOUNTER — Inpatient Hospital Stay: Payer: BC Managed Care – PPO | Attending: Physician Assistant

## 2023-10-18 DIAGNOSIS — E538 Deficiency of other specified B group vitamins: Secondary | ICD-10-CM | POA: Insufficient documentation

## 2023-10-18 DIAGNOSIS — D45 Polycythemia vera: Secondary | ICD-10-CM | POA: Insufficient documentation

## 2023-10-18 DIAGNOSIS — E559 Vitamin D deficiency, unspecified: Secondary | ICD-10-CM | POA: Insufficient documentation

## 2023-10-18 DIAGNOSIS — R7989 Other specified abnormal findings of blood chemistry: Secondary | ICD-10-CM

## 2023-10-18 LAB — CBC WITH DIFFERENTIAL/PLATELET
Abs Immature Granulocytes: 0.03 10*3/uL (ref 0.00–0.07)
Basophils Absolute: 0 10*3/uL (ref 0.0–0.1)
Basophils Relative: 0 %
Eosinophils Absolute: 0.1 10*3/uL (ref 0.0–0.5)
Eosinophils Relative: 1 %
HCT: 40.4 % (ref 36.0–46.0)
Hemoglobin: 12.8 g/dL (ref 12.0–15.0)
Immature Granulocytes: 0 %
Lymphocytes Relative: 27 %
Lymphs Abs: 1.9 10*3/uL (ref 0.7–4.0)
MCH: 30.3 pg (ref 26.0–34.0)
MCHC: 31.7 g/dL (ref 30.0–36.0)
MCV: 95.5 fL (ref 80.0–100.0)
Monocytes Absolute: 0.6 10*3/uL (ref 0.1–1.0)
Monocytes Relative: 8 %
Neutro Abs: 4.4 10*3/uL (ref 1.7–7.7)
Neutrophils Relative %: 64 %
Platelets: 280 10*3/uL (ref 150–400)
RBC: 4.23 MIL/uL (ref 3.87–5.11)
RDW: 12.5 % (ref 11.5–15.5)
WBC: 7.1 10*3/uL (ref 4.0–10.5)
nRBC: 0 % (ref 0.0–0.2)

## 2023-10-18 LAB — COMPREHENSIVE METABOLIC PANEL
ALT: 9 U/L (ref 0–44)
AST: 10 U/L — ABNORMAL LOW (ref 15–41)
Albumin: 3.7 g/dL (ref 3.5–5.0)
Alkaline Phosphatase: 63 U/L (ref 38–126)
Anion gap: 9 (ref 5–15)
BUN: <5 mg/dL — ABNORMAL LOW (ref 6–20)
CO2: 28 mmol/L (ref 22–32)
Calcium: 8.6 mg/dL — ABNORMAL LOW (ref 8.9–10.3)
Chloride: 101 mmol/L (ref 98–111)
Creatinine, Ser: 0.65 mg/dL (ref 0.44–1.00)
GFR, Estimated: >60 mL/min (ref >60)
Glucose, Bld: 86 mg/dL (ref 70–99)
Potassium: 3.3 mmol/L — ABNORMAL LOW (ref 3.5–5.1)
Sodium: 138 mmol/L (ref 135–145)
Total Bilirubin: 0.4 mg/dL (ref <1.2)
Total Protein: 6.5 g/dL (ref 6.5–8.1)

## 2023-10-18 LAB — VITAMIN D 25 HYDROXY (VIT D DEFICIENCY, FRACTURES): Vit D, 25-Hydroxy: 37.14 ng/mL (ref 30–100)

## 2023-10-18 LAB — VITAMIN B12: Vitamin B-12: 445 pg/mL (ref 180–914)

## 2023-10-21 LAB — METHYLMALONIC ACID, SERUM: Methylmalonic Acid, Quantitative: 114 nmol/L (ref 0–378)

## 2023-10-25 ENCOUNTER — Inpatient Hospital Stay (HOSPITAL_BASED_OUTPATIENT_CLINIC_OR_DEPARTMENT_OTHER): Payer: BC Managed Care – PPO | Admitting: Oncology

## 2023-10-25 DIAGNOSIS — D45 Polycythemia vera: Secondary | ICD-10-CM

## 2023-10-25 NOTE — Progress Notes (Signed)
Virtual Visit via Telephone Note  I connected with Cheryl Cross on 10/25/23 at  2:00 PM EST by telephone and verified that I am speaking with the correct person using two identifiers.  Location: Patient: Home  Provider: Clinic    I discussed the limitations, risks, security and privacy concerns of performing an evaluation and management service by telephone and the availability of in person appointments. I also discussed with the patient that there may be a patient responsible charge related to this service. The patient expressed understanding and agreed to proceed.   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  She was last evaluated on 08/29/2023.  She continues to receive intermittent phlebotomies and last phlebotomy was on 01/23/2023-500 mL was removed.  Had syncopal episode following the phlebotomy.  Reports an upper endoscopy on 03/27/2023 for workup for epigastric pain and nausea which revealed a 3 cm hiatal hernia, erosive gastropathy with no stigmata of recent bleeding and normal duodenum.  She continues to deny any bright red blood per rectum melena or hematochezia.  Continues to have chronic bone pain which worsens when her hemoglobin is elevated.  Feels like she may need phlebotomy soon.  Diagnosed with low B12 level and vitamin D levels at her last visit with her PCP.  She was started on B12 supplements and given a prescription for ergocalciferol 50,000 units weekly.  Vitamin D made her bones hurt worse so she has stopped taking it.  She is also taking OTC potassium supplements.  Since her last visit, she denies any hospitalizations, surgeries or changes in her baseline health.  Reports overall she feels well.  Her dad is at home with hospice and she is still taking care of him.  Does not like she needs a phlebotomy at this time.  Her last phlebotomy was on 05/30/2023.    Appetite  is 50% energy levels are 25%.  Has 6 out of 10 back pain.  Has occasional diarrhea.  Anxiety and depression is being managed.  Observations/Objective: Review of Systems  Constitutional:  Positive for malaise/fatigue.  Gastrointestinal:  Positive for diarrhea.  Musculoskeletal:  Positive for back pain.  Psychiatric/Behavioral:  Positive for depression. The patient is nervous/anxious.      Assessment and Plan: Physical Exam Neurological:     Mental Status: She is alert and oriented to person, place, and time.      Follow Up Instructions: 1. Polycythemia vera (HCC) (Primary) -Labs from 10/18/2023 shows a hemoglobin of 12.8 with normal differential.  Hematocrit 40.4.  Platelet count 280.  Differential is normal. -Reports hot flashes, night sweats and bone pains when her hematocrit is greater than 42. -She continues to take 81 mg aspirin daily. -She takes tramadol about 3 times per week for bone pain related to polycythemia. -Does not like she needs in phlebotomy at this time. -Return to clinic in 3 months with labs and follow-up.  2.  Vitamin D deficiency: -Vitamin D level was 20.61 on 07/04/2023. -Was prescribed ergocalciferol 50,000 units weekly but unfortunately this caused increased bone pain. -Repeat labs from 10/18/2023 show a vitamin D level of 37.14. -Nothing additional at this time.  3.  Vitamin B12 deficiency: -Vitamin B12 level on 07/04/2023 was 100.  She was started on B12 complex supplements. -Repeat B12 level on 10/18/2023 was 445. -Continue B12 supplements.  4.  Hypokalemia: -She is currently taking 99 mg OTC potassium supplements. -Most recent potassium level was 3.3. -  Continue potassium supplements.   I discussed the assessment and treatment plan with the patient. The patient was provided an opportunity to ask questions and all were answered. The patient agreed with the plan and demonstrated an understanding of the instructions.   The patient was advised to call  back or seek an in-person evaluation if the symptoms worsen or if the condition fails to improve as anticipated.  I provided 20 minutes of non-face-to-face time during this encounter.   Mauro Kaufmann, NP

## 2023-10-29 ENCOUNTER — Telehealth (HOSPITAL_COMMUNITY): Payer: BC Managed Care – PPO | Admitting: Psychiatry

## 2023-10-29 ENCOUNTER — Encounter (HOSPITAL_COMMUNITY): Payer: Self-pay | Admitting: Psychiatry

## 2023-10-29 DIAGNOSIS — F321 Major depressive disorder, single episode, moderate: Secondary | ICD-10-CM | POA: Diagnosis not present

## 2023-10-29 DIAGNOSIS — F429 Obsessive-compulsive disorder, unspecified: Secondary | ICD-10-CM

## 2023-10-29 MED ORDER — CLONAZEPAM 0.5 MG PO TABS: 0.5000 mg | ORAL_TABLET | Freq: Four times a day (QID) | ORAL | 2 refills | Status: DC | PRN

## 2023-10-29 MED ORDER — SERTRALINE HCL 100 MG PO TABS
200.0000 mg | ORAL_TABLET | Freq: Every day | ORAL | 3 refills | Status: DC
Start: 2023-10-29 — End: 2024-01-21

## 2023-10-29 NOTE — Progress Notes (Signed)
Virtual Visit via Video Note  I connected with Cheryl Cross on 10/29/23 at  3:30 PM EST by a video enabled telemedicine application and verified that I am speaking with the correct person using two identifiers.  Location: Patient: home Provider: office   I discussed the limitations of evaluation and management by telemedicine and the availability of in person appointments. The patient expressed understanding and agreed to proceed.      I discussed the assessment and treatment plan with the patient. The patient was provided an opportunity to ask questions and all were answered. The patient agreed with the plan and demonstrated an understanding of the instructions.   The patient was advised to call back or seek an in-person evaluation if the symptoms worsen or if the condition fails to improve as anticipated.  I provided 20 minutes of non-face-to-face time during this encounter.   [name], MD  [facility] MD/PA/NP OP Progress Note  10/29/2023 3:48 PM Cheryl Cross  MRN:  [id number]  Chief Complaint:  Chief Complaint  Patient presents with   Depression   Anxiety   Follow-up   HPI: This patient is a 57 year old widowed white female who lives alone in [place].  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  The patient returns for follow-up after 2 months regarding her depression and anxiety.  She states that she is a bit more depressed lately.  December 8 was the anniversary of her husband's death 4 years ago.  She is taking care of her dad every day and was in hospice care.  She feels like she does not have much joy in her life.  She does not have any thoughts of self-harm or suicide.  She is eating a little bit better and her lab work for oncology has shown improvement.  I suggest that we go up a little bit more on the Zoloft and she is in agreement. Visit Diagnosis:    ICD-10-CM   1. Major depressive disorder,  single episode, moderate (HCC)  F32.1 sertraline (ZOLOFT) 100 MG tablet    2. Obsessive-compulsive disorder, unspecified type  F42.9 sertraline (ZOLOFT) 100 MG tablet      Past Psychiatric History: Past psychiatric hospitalization in 2013, otherwise outpatient treatment  Past Medical History:  Past Medical History:  Diagnosis Date   Abdominal pain, right upper quadrant 06/10/2018   Anxiety    Arthritis    Bursitis    Cone-rod dystrophy 2021   DDD (degenerative disc disease)    Depression    Fibromyalgia    Nausea and vomiting 02/26/2023   Obsessive-compulsive disorder    Overactive bladder    Polycythemia vera(238.4) 11/2011   PTSD (post-traumatic stress disorder)    PVC's (premature ventricular contractions)    pt. placed on heart monitor x 24hours, everything fine    Sesamoiditis 12/2015    Past Surgical History:  Procedure Laterality Date   ABDOMINAL HYSTERECTOMY     BIOPSY  01/04/2021   Procedure: BIOPSY;  Surgeon: Dolores Frame, MD;  [facility];  Service: Gastroenterology;;  small bowel biopsy   BIOPSY  03/27/2023   Procedure: BIOPSY;  Surgeon: Dolores Frame, MD;  [facility];  Service: Gastroenterology;;   COLONOSCOPY WITH PROPOFOL N/A 01/04/2021   Procedure: COLONOSCOPY WITH PROPOFOL;  Surgeon: Dolores Frame, MD;  [facility];  Service: Gastroenterology;  Laterality: N/A;  1115   ESOPHAGOGASTRODUODENOSCOPY N/A 09/10/2014  Procedure: ESOPHAGOGASTRODUODENOSCOPY (EGD);  Surgeon: Malissa Hippo, MD;  [facility];  Service: Endoscopy;  Laterality: N/A;  130 - moved to 3:15 - Ann to notify   ESOPHAGOGASTRODUODENOSCOPY (EGD) WITH PROPOFOL N/A 01/04/2021   Procedure: ESOPHAGOGASTRODUODENOSCOPY (EGD) WITH PROPOFOL;  Surgeon: Dolores Frame, MD;  [facility];  Service: Gastroenterology;  Laterality: N/A;   ESOPHAGOGASTRODUODENOSCOPY (EGD) WITH PROPOFOL N/A 03/27/2023    Procedure: ESOPHAGOGASTRODUODENOSCOPY (EGD) WITH PROPOFOL;  Surgeon: Dolores Frame, MD;  [facility];  Service: Gastroenterology;  Laterality: N/A;  9:00am;asa 2   LUMBAR FUSION     POLYPECTOMY  01/04/2021   Procedure: POLYPECTOMY INTESTINAL;  Surgeon: Dolores Frame, MD;  [facility];  Service: Gastroenterology;;  cecal lesion; ascending colon polyp;    TOOTH EXTRACTION Left Aug 2016    Family Psychiatric History: See below  Family History:  Family History  Problem Relation Age of Onset   Cancer - Colon Mother        age 23   Anxiety disorder Mother    Diabetes Mother    Atrial fibrillation Father    Lung cancer Father    Bipolar disorder Sister    Atrial fibrillation Brother    Dementia Neg Hx    Drug abuse Neg Hx    Paranoid behavior Neg Hx    Schizophrenia Neg Hx    Seizures Neg Hx    Sexual abuse Neg Hx    Physical abuse Neg Hx     Social History:  Social History   Socioeconomic History   Marital status: Widowed    Spouse name: Not on file   Number of children: 0   Years of education: BA   Highest education level: Not on file  Occupational History   Occupation: [employer]- on leave (since 08/2020)  Tobacco Use   Smoking status: Every Day    Current packs/day: 1.00    Average packs/day: 1 pack/day for 27.0 years (27.0 ttl pk-yrs)    Types: Cigarettes    Start date: 11/12/1996    Passive exposure: Current   Smokeless tobacco: Never   Tobacco comments:    1 pack a day x 20 yrs.  Vaping Use   Vaping status: Never Used  Substance and Sexual Activity   Alcohol use: No    Alcohol/week: 0.0 standard drinks of alcohol   Drug use: No    Types: Hydrocodone, Benzodiazepines   Sexual activity: Not Currently  Other Topics Concern   Not on file  Social History Narrative   Right handed   2-3 glasses soda per day   Lives alone   Social Drivers of Health   Financial Resource Strain: Not on file   Food Insecurity: Not on file  Transportation Needs: Not on file  Physical Activity: Not on file  Stress: Not on file  Social Connections: Not on file    Allergies:  Allergies  Allergen Reactions   Gabapentin Hives   Hydrocodone-Acetaminophen Nausea Only   Penicillins Other (See Comments)    Unknown child hood reaction .Did it involve swelling of the face/tongue/throat, SOB, or low BP? Unknown Did it involve sudden or severe rash/hives, skin peeling, or any reaction on the inside of your mouth or nose? Unknown Did you need to seek medical attention at a hospital or doctor's office? Unknown When did it last happen?       If all above answers are "NO", may proceed with cephalosporin use.  Metabolic Disorder Labs: No results found for: "HGBA1C", "MPG" No results found for: "PROLACTIN" No results found for: "CHOL", "TRIG", "HDL", "CHOLHDL", "VLDL", "LDLCALC" Lab Results  Component Value Date   TSH 2.387 07/04/2023   TSH 1.770 10/13/2020    Therapeutic Level Labs: No results found for: "LITHIUM" No results found for: "VALPROATE" No results found for: "CBMZ"  Current Medications: Current Outpatient Medications  Medication Sig Dispense Refill   clonazePAM (KLONOPIN) 0.5 MG tablet Take 1 tablet (0.5 mg total) by mouth 4 (four) times daily as needed for anxiety. 120 tablet 2   Cyanocobalamin (B-12 COMPLIANCE INJECTION IJ) Inject 1,000 mcg as directed every 30 (thirty) days.     diclofenac Sodium (VOLTAREN) 1 % GEL Apply 1 Application topically 4 (four) times daily as needed (pain. (applied to foot area)).     methocarbamol (ROBAXIN) 500 MG tablet Take 250 mg by mouth every 8 (eight) hours as needed for muscle spasms.     pantoprazole (PROTONIX) 40 MG tablet Take 40 mg by mouth daily before breakfast.     prochlorperazine (COMPAZINE) 5 MG tablet Take 5-10 mg by mouth every 6 (six) hours as needed for vomiting or nausea.     sertraline (ZOLOFT) 100 MG tablet Take 2 tablets (200  mg total) by mouth daily. 60 tablet 3   traMADol (ULTRAM) 50 MG tablet Take 1 tablet (50 mg total) by mouth daily as needed for severe pain. 30 tablet 0   traZODone (DESYREL) 50 MG tablet TAKE 1 TABLET(50 MG) BY MOUTH AT BEDTIME 30 tablet 3   vitamin B-12 (CYANOCOBALAMIN) 100 MCG tablet Take 100 mcg by mouth daily.     No current facility-administered medications for this visit.     Musculoskeletal: Strength & Muscle Tone: within normal limits Gait & Station: normal Patient leans: N/A  Psychiatric Specialty Exam: Review of Systems  Eyes:  Positive for visual disturbance.  Psychiatric/Behavioral:  Positive for dysphoric mood and sleep disturbance. The patient is nervous/anxious.   All other systems reviewed and are negative.   There were no vitals taken for this visit.There is no height or weight on file to calculate BMI.  General Appearance: Casual and Fairly Groomed  Eye Contact:  Good  Speech:  Clear and Coherent  Volume:  Normal  Mood:  Dysphoric  Affect:  Congruent  Thought Process:  Goal Directed  Orientation:  Full (Time, Place, and Person)  Thought Content: Rumination   Suicidal Thoughts:  No  Homicidal Thoughts:  No  Memory:  Immediate;   Good Recent;   Good Remote;   NA  Judgement:  Good  Insight:  Good  Psychomotor Activity:  Normal  Concentration: Fair  Recall:  Good  Fund of Knowledge: Good  Language: Good  Akathisia:  No  Handed:  Right  AIMS (if indicated): not done  Assets:  Communication Skills Desire for Improvement Resilience Social Support  ADL's:  Intact  Cognition: WNL  Sleep:  Fair   Screenings: GAD-7    Garment/textile technologist Visit from 05/22/2023 in [facility] [place] Counselor from 01/13/2021 in [facility] [place]  Total GAD-7 Score 21 17      PHQ2-9    Flowsheet Row Video Visit from 10/29/2023 in [facility] at [place] Counselor  from 10/08/2023 in [facility] at [place] Office Visit from 05/22/2023 in [facility] at [place] Video Visit from 01/14/2023 in [facility] at [facility] Video Visit from 08/13/2022 in [facility] at [facility] Total Score 4 2 6 2 1   PHQ-9 Total Score 16 12 18 6  --      Flowsheet Row Video Visit from 10/29/2023 in [facility] at [place] Counselor from 10/08/2023 in [facility] at [place] Office Visit from 05/22/2023 in [facility] at [place]  C-SSRS RISK CATEGORY No Risk No Risk Error: Question 6 not populated        Assessment and Plan: This patient is a 57 year old female with a 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  She has been more depressed recently so we will increase Zoloft to 200 mg daily for depression.  She had stopped taking the trazodone 50 mg at bedtime but I urged her to take it so she can sleep.  She also can continue clonazepam 0.5 mg up to 4 times daily for anxiety.  She will return to see me in 6 weeks  Collaboration of Care: Collaboration of Care: Referral or follow-up with counselor/therapist AEB patient will continue therapy with Florencia Reasons in our office  Patient/Guardian was advised Release of Information must be obtained prior to any record release in order to collaborate their care with an outside provider. Patient/Guardian was advised if they have not already done so to contact the registration department to sign all necessary forms in order for [place] to release information regarding their care.   Consent: Patient/Guardian gives verbal consent for treatment and assignment of benefits for services provided during this visit. Patient/Guardian expressed understanding and agreed to proceed.    [name],  MD 10/29/2023, 3:48 PM

## 2023-11-11 ENCOUNTER — Encounter (HOSPITAL_COMMUNITY): Payer: Self-pay | Admitting: Hematology and Oncology

## 2023-12-09 ENCOUNTER — Encounter (HOSPITAL_COMMUNITY): Payer: Self-pay | Admitting: Hematology and Oncology

## 2023-12-09 ENCOUNTER — Ambulatory Visit (HOSPITAL_COMMUNITY): Payer: 59 | Admitting: Psychiatry

## 2023-12-12 ENCOUNTER — Telehealth (HOSPITAL_COMMUNITY): Payer: 59 | Admitting: Psychiatry

## 2023-12-23 ENCOUNTER — Ambulatory Visit (INDEPENDENT_AMBULATORY_CARE_PROVIDER_SITE_OTHER): Payer: 59 | Admitting: Psychiatry

## 2023-12-23 DIAGNOSIS — F321 Major depressive disorder, single episode, moderate: Secondary | ICD-10-CM | POA: Diagnosis not present

## 2023-12-23 NOTE — Progress Notes (Signed)
 IN-PERSON  THERAPIST PROGRESS NOTE  Session Time: Monday 12/23/2023 10:02 AM  -10:55 AM              Participation Level: Active  Behavioral Response: CasualAlertAnxious and Depressed/sad  Type of Therapy: Individual Therapy  Treatment Goals addressed: Establish therapeutic alliance / learn and implement coping skills to manage stress and anxiety   ProgressTowards Goals: Formal treatment plan will be developed next session   Interventions: Supportive  Summary: Cheryl Cross is a 58 y.o. female who is referred for services by psychiatrist Dr. Avanell Bob due to symptoms of depression. Pt reports one psychiatric hospitalization at [facility] in [place] due to depression. Pt participated in outpatient therapy with Dr. Cheryll Corti for about a year. Pt reports currently experiencing significant anxiety. She states not having much joy and having sensations like a panic attack.  Other symptoms include difficulty concentrating, fatigue, restlessness, muscle tension, and worrying.  Her current stressors include her father's health as he currently is receiving hospice care.  Patient also has health issues and reports she is losing her vision.   Patient last was seen about 2-1/2 months ago for the assessment appointment.  Per patient's report, her father died a week ago.  Funeral services were held this past Friday.  Patient reports she was with father when he died.  She reports worrying now about her 81 year old mother who also has some health issues.  Patient is thankful she and her brother are working together to take care of business affairs.  She reports stress and anxiety regarding her sister who has behavioral health issues.  Per patient's report, her sister resides with their mother but mainly isolates in her room when she is not working.  She reports  difficulty regarding interaction with sister as sister becomes upset very easily.  Patient expresses dread and frustration about  having to eventually address issues with sister about household issues.  Suicidal/Homicidal: Nowithout intent/plan  Therapist Response: Reviewed symptoms, facilitated patient sharing narrative of her father's death and expressing thoughts and feelings, validated feelings and normalized feelings related to grief and loss, began to discuss common reactions to grief, discussed self-care, discussed stressors, facilitated patient expressing thoughts and feelings regarding interaction with sister, validated feelings, began to provide psychoeducation on deep breathing and the stress response, discussed rationale for and assisted patient practicing deep breathing to trigger a relaxation response   Plan: Return again in 2 weeks.  Diagnosis: Major depressive disorder, single episode, moderate (HCC)  Collaboration of Care: Psychiatrist AEB patient sees psychiatrist Dr. Avanell Bob  Patient/Guardian was advised Release of Information must be obtained prior to any record release in order to collaborate their care with an outside provider. Patient/Guardian was advised if they have not already done so to contact the registration department to sign all necessary forms in order for us  to release information regarding their care.   Consent: Patient/Guardian gives verbal consent for treatment and assignment of benefits for services provided during this visit. Patient/Guardian expressed understanding and agreed to proceed.   Dicie Foster, LCSW 12/23/2023

## 2023-12-26 ENCOUNTER — Telehealth (HOSPITAL_COMMUNITY): Payer: 59 | Admitting: Psychiatry

## 2024-01-06 ENCOUNTER — Ambulatory Visit (HOSPITAL_COMMUNITY): Payer: 59 | Admitting: Psychiatry

## 2024-01-20 ENCOUNTER — Ambulatory Visit (HOSPITAL_COMMUNITY): Payer: BC Managed Care – PPO | Admitting: Psychiatry

## 2024-01-21 ENCOUNTER — Telehealth (HOSPITAL_COMMUNITY): Admitting: Psychiatry

## 2024-01-21 ENCOUNTER — Encounter (HOSPITAL_COMMUNITY): Payer: Self-pay | Admitting: Psychiatry

## 2024-01-21 DIAGNOSIS — F321 Major depressive disorder, single episode, moderate: Secondary | ICD-10-CM

## 2024-01-21 DIAGNOSIS — F429 Obsessive-compulsive disorder, unspecified: Secondary | ICD-10-CM | POA: Diagnosis not present

## 2024-01-21 DIAGNOSIS — F418 Other specified anxiety disorders: Secondary | ICD-10-CM

## 2024-01-21 DIAGNOSIS — F5105 Insomnia due to other mental disorder: Secondary | ICD-10-CM

## 2024-01-21 MED ORDER — TRAZODONE HCL 50 MG PO TABS
ORAL_TABLET | ORAL | 3 refills | Status: DC
Start: 2024-01-21 — End: 2024-06-25

## 2024-01-21 MED ORDER — CLONAZEPAM 0.5 MG PO TABS: 0.5000 mg | ORAL_TABLET | Freq: Four times a day (QID) | ORAL | 2 refills | Status: DC | PRN

## 2024-01-21 MED ORDER — SERTRALINE HCL 100 MG PO TABS
200.0000 mg | ORAL_TABLET | Freq: Every day | ORAL | 3 refills | Status: DC
Start: 2024-01-21 — End: 2024-03-16

## 2024-01-21 NOTE — Progress Notes (Signed)
 Virtual Visit via Video Note  I connected with Joleah Kosak Rabanal on 01/21/24 at  1:20 PM EDT by a video enabled telemedicine application and verified that I am speaking with the correct person using two identifiers.  Location: Patient: home Provider: office   I discussed the limitations of evaluation and management by telemedicine and the availability of in person appointments. The patient expressed understanding and agreed to proceed.      I discussed the assessment and treatment plan with the patient. The patient was provided an opportunity to ask questions and all were answered. The patient agreed with the plan and demonstrated an understanding of the instructions.   The patient was advised to call back or seek an in-person evaluation if the symptoms worsen or if the condition fails to improve as anticipated.  I provided 20 minutes of non-face-to-face time during this encounter.   [name], MD  [facility] MD/PA/NP OP Progress Note  01/21/2024 1:37 PM SUHANA WILNER  MRN:  [id number]  Chief Complaint:  Chief Complaint  Patient presents with   Anxiety   Depression   Follow-up   HPI: This patient is a 58 year old white female who lives alone in [place].  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  The patient returns for follow-up after 2 months regarding her depression and anxiety.  Last time she was more depressed.  The anniversary of her husband's death had been 2026/01/01and her father was also very ill.  We did increase her Zoloft to 200 mg daily.  She states that now she is feeling somewhat better.  Her father passed away in early 01/23/24 under hospice care.  They have had a Product/process development scientist which she states was very moving.  She is feeling at peace about it and thinks her father is no longer suffering.  She states her mother is doing fairly well.  The patient is still dealing with her visual  impairment which is getting worse and worse.  She is eventually going to look into services for the blind but right now she is trying to help her mother handle the dad's estate.  She is sleeping fairly well.  She still has episodes of anxiety at times but the clonazepam is helping.  She denies thoughts of self-harm. Visit Diagnosis:    ICD-10-CM   1. Major depressive disorder, single episode, moderate (HCC)  F32.1 sertraline (ZOLOFT) 100 MG tablet    2. Obsessive-compulsive disorder, unspecified type  F42.9 sertraline (ZOLOFT) 100 MG tablet    3. Insomnia secondary to depression with anxiety  F51.05 traZODone (DESYREL) 50 MG tablet   F41.8       Past Psychiatric History: Past psychiatric hospitalization in 2013, otherwise outpatient treatment  Past Medical History:  Past Medical History:  Diagnosis Date   Abdominal pain, right upper quadrant 06/10/2018   Anxiety    Arthritis    Bursitis    Cone-rod dystrophy 2021   DDD (degenerative disc disease)    Depression    Fibromyalgia    Nausea and vomiting 02/26/2023   Obsessive-compulsive disorder    Overactive bladder    Polycythemia vera(238.4) 11/2011   PTSD (post-traumatic stress disorder)    PVC's (premature ventricular contractions)    pt. placed on heart monitor x 24hours, everything fine    Sesamoiditis 01-23-2016    Past Surgical History:  Procedure Laterality Date   ABDOMINAL HYSTERECTOMY  BIOPSY  01/04/2021   Procedure: BIOPSY;  Surgeon: Dolores Frame, MD;  [facility];  Service: Gastroenterology;;  small bowel biopsy   BIOPSY  03/27/2023   Procedure: BIOPSY;  Surgeon: Dolores Frame, MD;  [facility];  Service: Gastroenterology;;   COLONOSCOPY WITH PROPOFOL N/A 01/04/2021   Procedure: COLONOSCOPY WITH PROPOFOL;  Surgeon: Dolores Frame, MD;  [facility];  Service: Gastroenterology;  Laterality: N/A;  1115   ESOPHAGOGASTRODUODENOSCOPY N/A 09/10/2014    Procedure: ESOPHAGOGASTRODUODENOSCOPY (EGD);  Surgeon: Malissa Hippo, MD;  [facility];  Service: Endoscopy;  Laterality: N/A;  130 - moved to 3:15 - Ann to notify   ESOPHAGOGASTRODUODENOSCOPY (EGD) WITH PROPOFOL N/A 01/04/2021   Procedure: ESOPHAGOGASTRODUODENOSCOPY (EGD) WITH PROPOFOL;  Surgeon: Dolores Frame, MD;  [facility];  Service: Gastroenterology;  Laterality: N/A;   ESOPHAGOGASTRODUODENOSCOPY (EGD) WITH PROPOFOL N/A 03/27/2023   Procedure: ESOPHAGOGASTRODUODENOSCOPY (EGD) WITH PROPOFOL;  Surgeon: Dolores Frame, MD;  [facility];  Service: Gastroenterology;  Laterality: N/A;  9:00am;asa 2   LUMBAR FUSION     POLYPECTOMY  01/04/2021   Procedure: POLYPECTOMY INTESTINAL;  Surgeon: Dolores Frame, MD;  [facility];  Service: Gastroenterology;;  cecal lesion; ascending colon polyp;    TOOTH EXTRACTION Left Aug 2016    Family Psychiatric History: See below  Family History:  Family History  Problem Relation Age of Onset   Cancer - Colon Mother        age 59   Anxiety disorder Mother    Diabetes Mother    Atrial fibrillation Father    Lung cancer Father    Bipolar disorder Sister    Atrial fibrillation Brother    Dementia Neg Hx    Drug abuse Neg Hx    Paranoid behavior Neg Hx    Schizophrenia Neg Hx    Seizures Neg Hx    Sexual abuse Neg Hx    Physical abuse Neg Hx     Social History:  Social History   Socioeconomic History   Marital status: Widowed    Spouse name: Not on file   Number of children: 0   Years of education: BA   Highest education level: Not on file  Occupational History   Occupation: [employer]- on leave (since 08/2020)  Tobacco Use   Smoking status: Every Day    Current packs/day: 1.00    Average packs/day: 1 pack/day for 27.2 years (27.2 ttl pk-yrs)    Types: Cigarettes    Start date: 11/12/1996    Passive exposure: Current   Smokeless tobacco:  Never   Tobacco comments:    1 pack a day x 20 yrs.  Vaping Use   Vaping status: Never Used  Substance and Sexual Activity   Alcohol use: No    Alcohol/week: 0.0 standard drinks of alcohol   Drug use: No    Types: Hydrocodone, Benzodiazepines   Sexual activity: Not Currently  Other Topics Concern   Not on file  Social History Narrative   Right handed   2-3 glasses soda per day   Lives alone   Social Drivers of Health   Financial Resource Strain: Not on file  Food Insecurity: Not on file  Transportation Needs: Not on file  Physical Activity: Not on file  Stress: Not on file  Social Connections: Not on file    Allergies:  Allergies  Allergen Reactions   Gabapentin Hives  Hydrocodone-Acetaminophen Nausea Only   Penicillins Other (See Comments)    Unknown child hood reaction .Did it involve swelling of the face/tongue/throat, SOB, or low BP? Unknown Did it involve sudden or severe rash/hives, skin peeling, or any reaction on the inside of your mouth or nose? Unknown Did you need to seek medical attention at a hospital or doctor's office? Unknown When did it last happen?       If all above answers are "NO", may proceed with cephalosporin use.     Metabolic Disorder Labs: No results found for: "HGBA1C", "MPG" No results found for: "PROLACTIN" No results found for: "CHOL", "TRIG", "HDL", "CHOLHDL", "VLDL", "LDLCALC" Lab Results  Component Value Date   TSH 2.387 07/04/2023   TSH 1.770 10/13/2020    Therapeutic Level Labs: No results found for: "LITHIUM" No results found for: "VALPROATE" No results found for: "CBMZ"  Current Medications: Current Outpatient Medications  Medication Sig Dispense Refill   clonazePAM (KLONOPIN) 0.5 MG tablet Take 1 tablet (0.5 mg total) by mouth 4 (four) times daily as needed for anxiety. 120 tablet 2   Cyanocobalamin (B-12 COMPLIANCE INJECTION IJ) Inject 1,000 mcg as directed every 30 (thirty) days.     diclofenac Sodium (VOLTAREN)  1 % GEL Apply 1 Application topically 4 (four) times daily as needed (pain. (applied to foot area)).     methocarbamol (ROBAXIN) 500 MG tablet Take 250 mg by mouth every 8 (eight) hours as needed for muscle spasms.     pantoprazole (PROTONIX) 40 MG tablet Take 40 mg by mouth daily before breakfast.     prochlorperazine (COMPAZINE) 5 MG tablet Take 5-10 mg by mouth every 6 (six) hours as needed for vomiting or nausea.     sertraline (ZOLOFT) 100 MG tablet Take 2 tablets (200 mg total) by mouth daily. 60 tablet 3   traMADol (ULTRAM) 50 MG tablet Take 1 tablet (50 mg total) by mouth daily as needed for severe pain. 30 tablet 0   traZODone (DESYREL) 50 MG tablet TAKE 1 TABLET(50 MG) BY MOUTH AT BEDTIME 30 tablet 3   vitamin B-12 (CYANOCOBALAMIN) 100 MCG tablet Take 100 mcg by mouth daily.     No current facility-administered medications for this visit.     Musculoskeletal: Strength & Muscle Tone: within normal limits Gait & Station: normal Patient leans: N/A  Psychiatric Specialty Exam: Review of Systems  Eyes:  Positive for visual disturbance.  Psychiatric/Behavioral:  The patient is nervous/anxious.   All other systems reviewed and are negative.   There were no vitals taken for this visit.There is no height or weight on file to calculate BMI.  General Appearance: Casual and Fairly Groomed  Eye Contact:  Good  Speech:  Clear and Coherent  Volume:  Normal  Mood:  Anxious and Euthymic  Affect:  Congruent  Thought Process:  Goal Directed  Orientation:  Full (Time, Place, and Person)  Thought Content: Rumination   Suicidal Thoughts:  No  Homicidal Thoughts:  No  Memory:  Immediate;   Good Recent;   Good Remote;   Good  Judgement:  Good  Insight:  Good  Psychomotor Activity:  Normal  Concentration:  Concentration: Good and Attention Span: Good  Recall:  Good  Fund of Knowledge: Good  Language: Good  Akathisia:  No  Handed:  Right  AIMS (if indicated): not done  Assets:   Communication Skills Desire for Improvement Resilience Social Support  ADL's:  Intact  Cognition: WNL  Sleep:  Good   Screenings:  GAD-7    Flowsheet Row Office Visit from 05/22/2023 in [facility] [place] Counselor from 01/13/2021 in [facility] at [facility]  Total GAD-7 Score 21 17      PHQ2-9    Flowsheet Row Video Visit from 10/29/2023 in [facility] at [place] Counselor from 10/08/2023 in [facility] at [place] Office Visit from 05/22/2023 in [facility] at [place] Video Visit from 01/14/2023 in [facility] at [place] Video Visit from 08/13/2022 in [facility] at [facility] Total Score 4 2 6 2 1   PHQ-9 Total Score 16 12 18 6  --      Flowsheet Row Video Visit from 10/29/2023 in [facility] at [place] Counselor from 10/08/2023 in [facility] at [place] Office Visit from 05/22/2023 in [facility] at [place]  C-SSRS RISK CATEGORY No Risk No Risk Error: Question 6 not populated        Assessment and Plan: This patient is a 58 year old female with a 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  She has been doing better recently so we will continue Zoloft to 200 mg daily for depression.  She will continue clonazepam 0.5 mg up to 4 times daily for anxiety and trazodone 50 mg at bedtime as needed for sleep.  She will return to see me in 3 months  Collaboration of Care: Collaboration of Care: Primary Care Provider AEB notes will be shared with PCP at patient's request  Patient/Guardian was advised Release of Information must be obtained prior to any record release in order to collaborate their care with an outside provider.  Patient/Guardian was advised if they have not already done so to contact the registration department to sign all necessary forms in order for [place] to release information regarding their care.   Consent: Patient/Guardian gives verbal consent for treatment and assignment of benefits for services provided during this visit. Patient/Guardian expressed understanding and agreed to proceed.    [name], MD 01/21/2024, 1:37 PM

## 2024-01-22 ENCOUNTER — Telehealth (HOSPITAL_COMMUNITY): Payer: 59 | Admitting: Psychiatry

## 2024-02-03 ENCOUNTER — Ambulatory Visit (HOSPITAL_COMMUNITY): Payer: BC Managed Care – PPO | Admitting: Psychiatry

## 2024-02-06 ENCOUNTER — Other Ambulatory Visit: Payer: Self-pay

## 2024-02-06 DIAGNOSIS — R7989 Other specified abnormal findings of blood chemistry: Secondary | ICD-10-CM

## 2024-02-06 DIAGNOSIS — E538 Deficiency of other specified B group vitamins: Secondary | ICD-10-CM

## 2024-02-06 DIAGNOSIS — D45 Polycythemia vera: Secondary | ICD-10-CM

## 2024-02-07 ENCOUNTER — Inpatient Hospital Stay: Payer: BC Managed Care – PPO | Attending: Physician Assistant

## 2024-02-07 ENCOUNTER — Encounter (HOSPITAL_COMMUNITY): Payer: Self-pay | Admitting: Hematology and Oncology

## 2024-02-07 ENCOUNTER — Other Ambulatory Visit (HOSPITAL_COMMUNITY): Payer: Self-pay | Admitting: Obstetrics & Gynecology

## 2024-02-07 DIAGNOSIS — Z1231 Encounter for screening mammogram for malignant neoplasm of breast: Secondary | ICD-10-CM

## 2024-02-07 DIAGNOSIS — E538 Deficiency of other specified B group vitamins: Secondary | ICD-10-CM | POA: Insufficient documentation

## 2024-02-07 DIAGNOSIS — D45 Polycythemia vera: Secondary | ICD-10-CM | POA: Insufficient documentation

## 2024-02-07 DIAGNOSIS — R7989 Other specified abnormal findings of blood chemistry: Secondary | ICD-10-CM

## 2024-02-07 DIAGNOSIS — E559 Vitamin D deficiency, unspecified: Secondary | ICD-10-CM | POA: Insufficient documentation

## 2024-02-07 LAB — CBC WITH DIFFERENTIAL/PLATELET
Abs Immature Granulocytes: 0.01 10*3/uL (ref 0.00–0.07)
Basophils Absolute: 0.1 10*3/uL (ref 0.0–0.1)
Basophils Relative: 1 %
Eosinophils Absolute: 0.1 10*3/uL (ref 0.0–0.5)
Eosinophils Relative: 2 %
HCT: 42.2 % (ref 36.0–46.0)
Hemoglobin: 13.8 g/dL (ref 12.0–15.0)
Immature Granulocytes: 0 %
Lymphocytes Relative: 28 %
Lymphs Abs: 1.7 10*3/uL (ref 0.7–4.0)
MCH: 29.9 pg (ref 26.0–34.0)
MCHC: 32.7 g/dL (ref 30.0–36.0)
MCV: 91.5 fL (ref 80.0–100.0)
Monocytes Absolute: 0.4 10*3/uL (ref 0.1–1.0)
Monocytes Relative: 7 %
Neutro Abs: 3.8 10*3/uL (ref 1.7–7.7)
Neutrophils Relative %: 62 %
Platelets: 196 10*3/uL (ref 150–400)
RBC: 4.61 MIL/uL (ref 3.87–5.11)
RDW: 12.7 % (ref 11.5–15.5)
WBC: 6.1 10*3/uL (ref 4.0–10.5)
nRBC: 0 % (ref 0.0–0.2)

## 2024-02-07 LAB — COMPREHENSIVE METABOLIC PANEL WITH GFR
ALT: 10 U/L (ref 0–44)
AST: 12 U/L — ABNORMAL LOW (ref 15–41)
Albumin: 4.1 g/dL (ref 3.5–5.0)
Alkaline Phosphatase: 66 U/L (ref 38–126)
Anion gap: 9 (ref 5–15)
BUN: 6 mg/dL (ref 6–20)
CO2: 27 mmol/L (ref 22–32)
Calcium: 8.9 mg/dL (ref 8.9–10.3)
Chloride: 101 mmol/L (ref 98–111)
Creatinine, Ser: 0.87 mg/dL (ref 0.44–1.00)
GFR, Estimated: >60 mL/min (ref >60)
Glucose, Bld: 101 mg/dL — ABNORMAL HIGH (ref 70–99)
Potassium: 3.1 mmol/L — ABNORMAL LOW (ref 3.5–5.1)
Sodium: 137 mmol/L (ref 135–145)
Total Bilirubin: 0.7 mg/dL (ref 0.0–1.2)
Total Protein: 7 g/dL (ref 6.5–8.1)

## 2024-02-07 LAB — VITAMIN D 25 HYDROXY (VIT D DEFICIENCY, FRACTURES): Vit D, 25-Hydroxy: 14.46 ng/mL — ABNORMAL LOW (ref 30–100)

## 2024-02-07 LAB — VITAMIN B12: Vitamin B-12: 446 pg/mL (ref 180–914)

## 2024-02-10 ENCOUNTER — Institutional Professional Consult (permissible substitution) (INDEPENDENT_AMBULATORY_CARE_PROVIDER_SITE_OTHER): Payer: Self-pay

## 2024-02-14 ENCOUNTER — Inpatient Hospital Stay: Payer: BC Managed Care – PPO | Attending: Physician Assistant | Admitting: Oncology

## 2024-02-14 DIAGNOSIS — E538 Deficiency of other specified B group vitamins: Secondary | ICD-10-CM | POA: Insufficient documentation

## 2024-02-14 DIAGNOSIS — E559 Vitamin D deficiency, unspecified: Secondary | ICD-10-CM | POA: Diagnosis not present

## 2024-02-14 DIAGNOSIS — D45 Polycythemia vera: Secondary | ICD-10-CM | POA: Diagnosis present

## 2024-02-14 DIAGNOSIS — R7989 Other specified abnormal findings of blood chemistry: Secondary | ICD-10-CM

## 2024-02-14 DIAGNOSIS — E876 Hypokalemia: Secondary | ICD-10-CM | POA: Diagnosis not present

## 2024-02-14 MED ORDER — POTASSIUM CHLORIDE CRYS ER 20 MEQ PO TBCR
20.0000 meq | EXTENDED_RELEASE_TABLET | Freq: Every day | ORAL | 0 refills | Status: DC
Start: 2024-02-14 — End: 2024-08-19

## 2024-02-14 MED ORDER — VITAMIN D 25 MCG (1000 UNIT) PO TABS: 2000.0000 [IU] | ORAL_TABLET | Freq: Every day | ORAL | 2 refills | Status: AC

## 2024-02-14 NOTE — Progress Notes (Signed)
 Virtual Visit via Telephone Note  I connected with Cheryl Cross on 02/14/24 at  2:30 PM EDT by telephone and verified that I am speaking with the correct person using two identifiers.  Location: Patient: Home  Provider: Clinic    I discussed the limitations, risks, security and privacy concerns of performing an evaluation and management service by telephone and the availability of in person appointments. I also discussed with the patient that there may be a patient responsible charge related to this service. The patient expressed understanding and agreed to proceed.   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  She was last evaluated on 10/25/2023 by me.  Reports an upper endoscopy on 03/27/2023 for workup for epigastric pain and nausea which revealed a 3 cm hiatal hernia, erosive gastropathy with no stigmata of recent bleeding and normal duodenum.  She continues to deny any bright red blood per rectum melena or hematochezia.  Diagnosed with low B12 level and vitamin D levels at her last visit with her PCP.  She was started on B12 supplements and given a prescription for ergocalciferol 50,000 units weekly.  Vitamin D made her bones hurt worse so she has stopped taking it.  She is also taking OTC potassium supplements.  Since her last visit, she denies any hospitalizations, surgeries or changes in her baseline health.  Reports overall she feels well.  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  She has not needed a phlebotomy since 05/30/2023.  Has irritable bowel syndrome with chronic diarrhea with several episodes daily.  She is taking OTC potassium supplements due to chronically low potassium levels.  She is unable to tolerate vitamin D ergocalciferol 50,000 units due to worsening bone pain.  She is currently not on vitamin D  supplements.  Appetite is 80% energy levels are 25%.  Has 5 out of 10 back pain.  Anxiety and depression is being managed.  Observations/Objective: Review of Systems  Constitutional:  Positive for malaise/fatigue.  Gastrointestinal:  Positive for diarrhea.  Musculoskeletal:  Positive for back pain.  Psychiatric/Behavioral:  Positive for depression. The patient is nervous/anxious.      Assessment and Plan: Physical Exam Neurological:     Mental Status: She is alert and oriented to person, place, and time.      Follow Up Instructions: 1. Polycythemia vera (HCC) (Primary) -Labs from 02/07/2024 shows a hemoglobin of 13.8 with normal differential.  Hematocrit 40.4.  Platelet count 280.  Differential is normal. -Reports hot flashes, night sweats and bone pains when her hematocrit is greater than 42. -She continues to take 81 mg aspirin daily. -She takes tramadol about 3 times per week for bone pain related to polycythemia. -Does not think she needs a phlebotomy at this time. -Return to clinic in 4 months with labs and follow-up.  2.  Vitamin D deficiency: -Was prescribed ergocalciferol 50,000 units weekly but unfortunately this caused increased bone pain. -Repeat labs from 02/07/24 shows a vitamin D D level of 14.46 (37.14). -Unable to tolerate ergocalciferol. -We discussed starting OTC vitamin D 2000 units daily to see if she could tolerate. -Repeat vitamin D levels in 4 months.   3.  Vitamin B12 deficiency: -Vitamin B12 level on 07/04/2023 was 100.  She was started on B12 complex supplements. -Repeat B12 level on 10/18/2023 was 445 and 446 on 02/07/24. -Continue B12 supplements.  4.  Hypokalemia: -Likley secondary from diarrhea  -She is currently taking 99 mg OTC potassium supplements. -Most recent potassium level was 3.1 (3.3).  -Discussed trying prescribed potassium supplements 20 mill equivalents daily. -Will recheck at next visit.  PLAN SUMMARY: >> No phlebotomy needed at  this time. >>Start 2000 units vitamin D supplements. >> Rx sent for potassium 20 mill equivalents daily given persistent hypokalemia. >> Continue B12 supplements. >> RTC in 4 months with labs a few days before and in office visit.       I discussed the assessment and treatment plan with the patient. The patient was provided an opportunity to ask questions and all were answered. The patient agreed with the plan and demonstrated an understanding of the instructions.   The patient was advised to call back or seek an in-person evaluation if the symptoms worsen or if the condition fails to improve as anticipated.  I provided 20 minutes of non-face-to-face time during this encounter.   Mauro Kaufmann, NP

## 2024-02-15 LAB — METHYLMALONIC ACID, SERUM: Methylmalonic Acid, Quantitative: 110 nmol/L (ref 0–378)

## 2024-02-17 ENCOUNTER — Encounter (HOSPITAL_COMMUNITY): Payer: Self-pay | Admitting: Hematology and Oncology

## 2024-02-24 ENCOUNTER — Ambulatory Visit (INDEPENDENT_AMBULATORY_CARE_PROVIDER_SITE_OTHER): Payer: Self-pay | Admitting: Podiatry

## 2024-02-24 ENCOUNTER — Encounter: Payer: Self-pay | Admitting: Podiatry

## 2024-02-24 DIAGNOSIS — L600 Ingrowing nail: Secondary | ICD-10-CM | POA: Diagnosis not present

## 2024-02-24 NOTE — Progress Notes (Signed)
 Subjective:   Patient ID: Cheryl Cross, female   DOB: 58 y.o.   MRN: [id number]   HPI Patient states that all 5 nails on the right foot are really bothering me and the ones on the left foot that were fixed are doing great   ROS      Objective:  Physical Exam  Neurovascular status intact with incurvated nailbeds 1-3 4 and 5 right all get damaged with previous correction of the left that did very well     Assessment:  Chronic ingrown toenail deformity nails [id number] right with pain and slight redness no active drainage with well-healed nail sites left nailbeds 1 through 5     Plan:  H&P reviewed discussed correction she wants them fixed and wants them done permanently.  I did explain procedures and allowed her to read consent form understanding risk and the fact these are permanent.  I injected with 180 mg Xylocaine Marcaine mixture sterile prep done using sterile instrumentation removed nails 1-3 4 and 5 right exposed matrix applied phenol for applications 30 seconds each of the nailbeds and then applied alcohol lavage to neutralize.  Applied sterile dressings gave instructions to wear 24 hours but take them off earlier if throbbing were to occur and encouraged him to call with questions concerns which may arise

## 2024-02-24 NOTE — Patient Instructions (Signed)

## 2024-03-09 ENCOUNTER — Ambulatory Visit (HOSPITAL_COMMUNITY)

## 2024-03-16 ENCOUNTER — Ambulatory Visit (HOSPITAL_COMMUNITY)
Admission: RE | Admit: 2024-03-16 | Discharge: 2024-03-16 | Disposition: A | Discharge status: Home / Self Care | Source: Ambulatory Visit | Attending: Obstetrics & Gynecology | Admitting: Obstetrics & Gynecology

## 2024-03-16 ENCOUNTER — Other Ambulatory Visit (HOSPITAL_COMMUNITY): Payer: Self-pay | Admitting: Psychiatry

## 2024-03-16 DIAGNOSIS — F321 Major depressive disorder, single episode, moderate: Secondary | ICD-10-CM

## 2024-03-16 DIAGNOSIS — Z1231 Encounter for screening mammogram for malignant neoplasm of breast: Secondary | ICD-10-CM | POA: Insufficient documentation

## 2024-03-16 DIAGNOSIS — F429 Obsessive-compulsive disorder, unspecified: Secondary | ICD-10-CM

## 2024-03-26 ENCOUNTER — Other Ambulatory Visit (HOSPITAL_COMMUNITY): Payer: Self-pay | Admitting: Psychiatry

## 2024-04-20 ENCOUNTER — Other Ambulatory Visit: Payer: Self-pay | Admitting: Physician Assistant

## 2024-04-20 ENCOUNTER — Telehealth (HOSPITAL_COMMUNITY): Admitting: Psychiatry

## 2024-04-20 DIAGNOSIS — D45 Polycythemia vera: Secondary | ICD-10-CM

## 2024-04-20 DIAGNOSIS — G893 Neoplasm related pain (acute) (chronic): Secondary | ICD-10-CM

## 2024-05-19 ENCOUNTER — Telehealth (INDEPENDENT_AMBULATORY_CARE_PROVIDER_SITE_OTHER): Payer: Self-pay | Admitting: Psychiatry

## 2024-05-19 DIAGNOSIS — Z91199 Patient's noncompliance with other medical treatment and regimen due to unspecified reason: Secondary | ICD-10-CM

## 2024-05-20 NOTE — Progress Notes (Signed)
 No show

## 2024-05-28 ENCOUNTER — Encounter (HOSPITAL_COMMUNITY): Payer: Self-pay | Admitting: Psychiatry

## 2024-05-28 ENCOUNTER — Telehealth (HOSPITAL_COMMUNITY): Payer: Self-pay | Admitting: Psychiatry

## 2024-05-28 DIAGNOSIS — F321 Major depressive disorder, single episode, moderate: Secondary | ICD-10-CM | POA: Diagnosis not present

## 2024-05-28 DIAGNOSIS — F429 Obsessive-compulsive disorder, unspecified: Secondary | ICD-10-CM | POA: Diagnosis not present

## 2024-05-28 MED ORDER — FLUOXETINE HCL 40 MG PO CAPS: 40.0000 mg | ORAL_CAPSULE | Freq: Every day | ORAL | 2 refills | Status: DC

## 2024-05-28 MED ORDER — CLONAZEPAM 0.5 MG PO TABS: 0.5000 mg | ORAL_TABLET | Freq: Four times a day (QID) | ORAL | 2 refills | Status: DC | PRN

## 2024-05-28 NOTE — Progress Notes (Signed)
 Virtual Visit via Video Note  I connected with Cheryl Cross on 05/28/24 at 11:20 AM EDT by a video enabled telemedicine application and verified that I am speaking with the correct person using two identifiers.  Location: Patient: home Provider: office   I discussed the limitations of evaluation and management by telemedicine and the availability of in person appointments. The patient expressed understanding and agreed to proceed.     I discussed the assessment and treatment plan with the patient. The patient was provided an opportunity to ask questions and all were answered. The patient agreed with the plan and demonstrated an understanding of the instructions.   The patient was advised to call back or seek an in-person evaluation if the symptoms worsen or if the condition fails to improve as anticipated.  I provided 20 minutes of non-face-to-face time during this encounter.   Barnie Gull, MD  [facility] MD/PA/NP OP Progress Note  05/28/2024 11:37 AM Cheryl Cross  MRN:  [id number]  Chief Complaint:  Chief Complaint  Patient presents with   Anxiety   Depression   Follow-up   HPI: This patient is a 58 year old white female who lives alone in [place]. 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   The patient returns for follow-up after about 4 months regarding her depression and anxiety.  She states that she has been more depressed than she has been in a long time.  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  She did go to [facility] for another opinion and they have set her up to see a physician who is doing a clinical trial.  She also got some help from occupational therapy and a referral to services for the blind.  She is finding every day tasks more frustrating.  She has been more lonely although she has reconnected with a man she knew in high school and they have gone on a few dates.  She is  generally sleeping well.  She finds herself panicking and worrying a lot more as well as obsessing about getting everything done.  I looked through her record and she has never tried fluoxetine .  She is up to 200 mg of sertraline  which is no longer very helpful.  I suggested a switch to fluoxetine  and she is amenable.  She denies any thoughts of suicide or self-harm. Visit Diagnosis:    ICD-10-CM   1. Major depressive disorder, single episode, moderate (HCC)  F32.1     2. Obsessive-compulsive disorder, unspecified type  F42.9       Past Psychiatric History: Past psychiatric hospitalization in 2013, otherwise outpatient treatment  Past Medical History:  Past Medical History:  Diagnosis Date   Abdominal pain, right upper quadrant 06/10/2018   Anxiety    Arthritis    Bursitis    Cone-rod dystrophy 2021   DDD (degenerative disc disease)    Depression    Fibromyalgia    Nausea and vomiting 02/26/2023   Obsessive-compulsive disorder    Overactive bladder    Polycythemia vera(238.4) 11/2011   PTSD (post-traumatic stress disorder)    PVC's (premature ventricular contractions)    pt. placed on heart monitor x 24hours, everything fine    Sesamoiditis 12/2015    Past Surgical History:  Procedure Laterality Date   ABDOMINAL HYSTERECTOMY     BIOPSY  01/04/2021   Procedure: BIOPSY;  Surgeon: Eartha Angelia Sieving, MD;  [facility];  Service: Gastroenterology;;  small bowel biopsy   BIOPSY  03/27/2023   Procedure: BIOPSY;  Surgeon: Eartha Angelia Sieving, MD;  [facility];  Service: Gastroenterology;;   COLONOSCOPY WITH PROPOFOL  N/A 01/04/2021   Procedure: COLONOSCOPY WITH PROPOFOL ;  Surgeon: Eartha Angelia Sieving, MD;  [facility];  Service: Gastroenterology;  Laterality: N/A;  1115   ESOPHAGOGASTRODUODENOSCOPY N/A 09/10/2014   Procedure: ESOPHAGOGASTRODUODENOSCOPY (EGD);  Surgeon: Claudis RAYMOND Rivet, MD;  [facility];  Service:  Endoscopy;  Laterality: N/A;  130 - moved to 3:15 - Ann to notify   ESOPHAGOGASTRODUODENOSCOPY (EGD) WITH PROPOFOL  N/A 01/04/2021   Procedure: ESOPHAGOGASTRODUODENOSCOPY (EGD) WITH PROPOFOL ;  Surgeon: Eartha Angelia Sieving, MD;  [facility];  Service: Gastroenterology;  Laterality: N/A;   ESOPHAGOGASTRODUODENOSCOPY (EGD) WITH PROPOFOL  N/A 03/27/2023   Procedure: ESOPHAGOGASTRODUODENOSCOPY (EGD) WITH PROPOFOL ;  Surgeon: Eartha Angelia Sieving, MD;  [facility];  Service: Gastroenterology;  Laterality: N/A;  9:00am;asa 2   LUMBAR FUSION     POLYPECTOMY  01/04/2021   Procedure: POLYPECTOMY INTESTINAL;  Surgeon: Eartha Angelia Sieving, MD;  [facility];  Service: Gastroenterology;;  cecal lesion; ascending colon polyp;    TOOTH EXTRACTION Left Aug 2016    Family Psychiatric History: See below  Family History:  Family History  Problem Relation Age of Onset   Cancer - Colon Mother        age 68   Anxiety disorder Mother    Diabetes Mother    Atrial fibrillation Father    Lung cancer Father    Bipolar disorder Sister    Atrial fibrillation Brother    Dementia Neg Hx    Drug abuse Neg Hx    Paranoid behavior Neg Hx    Schizophrenia Neg Hx    Seizures Neg Hx    Sexual abuse Neg Hx    Physical abuse Neg Hx     Social History:  Social History   Socioeconomic History   Marital status: Widowed    Spouse name: Not on file   Number of children: 0   Years of education: BA   Highest education level: Not on file  Occupational History   Occupation: [employer]- on leave (since 08/2020)  Tobacco Use   Smoking status: Every Day    Current packs/day: 1.00    Average packs/day: 1 pack/day for 27.5 years (27.5 ttl pk-yrs)    Types: Cigarettes    Start date: 11/12/1996    Passive exposure: Current   Smokeless tobacco: Never   Tobacco comments:    1 pack a day x 20 yrs.  Vaping Use   Vaping status: Never Used  Substance and  Sexual Activity   Alcohol use: No    Alcohol/week: 0.0 standard drinks of alcohol   Drug use: No    Types: Hydrocodone, Benzodiazepines   Sexual activity: Not Currently  Other Topics Concern   Not on file  Social History Narrative   Right handed   2-3 glasses soda per day   Lives alone   Social Drivers of Health   Financial Resource Strain: Not on file  Food Insecurity: Not on file  Transportation Needs: Not on file  Physical Activity: Not on file  Stress: Not on file  Social Connections: Not on file    Allergies:  Allergies  Allergen Reactions   Gabapentin  Hives   Hydrocodone-Acetaminophen  Nausea Only   Penicillins Other (See Comments)    Unknown child hood reaction .Did  it involve swelling of the face/tongue/throat, SOB, or low BP? Unknown Did it involve sudden or severe rash/hives, skin peeling, or any reaction on the inside of your mouth or nose? Unknown Did you need to seek medical attention at a hospital or doctor's office? Unknown When did it last happen?       If all above answers are "NO", may proceed with cephalosporin use.     Metabolic Disorder Labs: No results found for: HGBA1C, MPG No results found for: PROLACTIN No results found for: CHOL, TRIG, HDL, CHOLHDL, VLDL, LDLCALC Lab Results  Component Value Date   TSH 2.387 07/04/2023   TSH 1.770 10/13/2020    Therapeutic Level Labs: No results found for: LITHIUM No results found for: VALPROATE No results found for: CBMZ  Current Medications: Current Outpatient Medications  Medication Sig Dispense Refill   FLUoxetine  (PROZAC ) 40 MG capsule Take 1 capsule (40 mg total) by mouth daily. 30 capsule 2   cholecalciferol  (VITAMIN D3) 25 MCG (1000 UNIT) tablet Take 2 tablets (2,000 Units total) by mouth daily. 120 tablet 2   clonazePAM  (KLONOPIN ) 0.5 MG tablet Take 1 tablet (0.5 mg total) by mouth 4 (four) times daily as needed for anxiety. 120 tablet 2   Cyanocobalamin  (B-12  COMPLIANCE INJECTION IJ) Inject 1,000 mcg as directed every 30 (thirty) days.     diclofenac Sodium (VOLTAREN) 1 % GEL Apply 1 Application topically 4 (four) times daily as needed (pain. (applied to foot area)).     methocarbamol  (ROBAXIN ) 500 MG tablet Take 250 mg by mouth every 8 (eight) hours as needed for muscle spasms.     oxyCODONE (OXY IR/ROXICODONE) 5 MG immediate release tablet Take 1 tablet every 4 hours by oral route as needed for 5 days, for severe pain.     pantoprazole  (PROTONIX ) 40 MG tablet Take 40 mg by mouth daily before breakfast.     potassium chloride  SA (KLOR-CON  M) 20 MEQ tablet Take 1 tablet (20 mEq total) by mouth daily. 90 tablet 0   prochlorperazine  (COMPAZINE ) 5 MG tablet Take 5-10 mg by mouth every 6 (six) hours as needed for vomiting or nausea.     traMADol  (ULTRAM ) 50 MG tablet Take 1 tablet (50 mg total) by mouth daily as needed for severe pain. 30 tablet 0   traZODone  (DESYREL ) 50 MG tablet TAKE 1 TABLET(50 MG) BY MOUTH AT BEDTIME 30 tablet 3   vitamin B-12 (CYANOCOBALAMIN ) 100 MCG tablet Take 100 mcg by mouth daily.     No current facility-administered medications for this visit.     Musculoskeletal: Strength & Muscle Tone: within normal limits Gait & Station: normal Patient leans: N/A  Psychiatric Specialty Exam: Review of Systems  Eyes:  Positive for visual disturbance.  Psychiatric/Behavioral:  Positive for dysphoric mood. The patient is nervous/anxious.   All other systems reviewed and are negative.   There were no vitals taken for this visit.There is no height or weight on file to calculate BMI.  General Appearance: Casual and Fairly Groomed  Eye Contact:  Good  Speech:  Clear and Coherent  Volume:  Normal  Mood:  Anxious and Depressed  Affect:  Congruent  Thought Process:  Goal Directed  Orientation:  Full (Time, Place, and Person)  Thought Content: Rumination   Suicidal Thoughts:  No  Homicidal Thoughts:  No  Memory:  Immediate;    Good Recent;   Good Remote;   Good  Judgement:  Good  Insight:  Good  Psychomotor Activity:  Decreased  Concentration:  Concentration: Fair and Attention Span: Fair  Recall:  Good  Fund of Knowledge: Good  Language: Good  Akathisia:  No  Handed:  Right  AIMS (if indicated): not done  Assets:  Communication Skills Desire for Improvement Resilience Social Support  ADL's:  Intact  Cognition: WNL  Sleep:  Good   Screenings: GAD-7    Flowsheet Row Office Visit from 05/22/2023 in [facility] at [place] Counselor from 01/13/2021 in [facility] at [facility]  Total GAD-7 Score 21 17   PHQ2-9    Flowsheet Row Video Visit from 10/29/2023 in [facility] at [place] Counselor from 10/08/2023 in [facility] at [place] Office Visit from 05/22/2023 in [facility] at [place] Video Visit from 01/14/2023 in [facility] at [place] Video Visit from 08/13/2022 in [facility] at [facility] Total Score 4 2 6 2 1   PHQ-9 Total Score 16 12 18 6  --   Flowsheet Row Video Visit from 10/29/2023 in [facility] at [place] Counselor from 10/08/2023 in [facility] at [place] Office Visit from 05/22/2023 in 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  The Zoloft  200 mg daily is no longer helping her depression so we will switch to Prozac  40 mg daily.  I encouraged her to overlap with the sertraline   Collaboration of Care: Collaboration of Care: Primary Care Provider AEB notes will be shared with PCP  at patient's request  Patient/Guardian was advised Release of Information must be obtained prior to any record release in order to collaborate their care with an outside provider. Patient/Guardian was advised if they have not already done so to contact the registration department to sign all necessary forms in order for us  to release information regarding their care.   Consent: Patient/Guardian gives verbal consent for treatment and assignment of benefits for services provided during this visit. Patient/Guardian expressed understanding and agreed to proceed.    Barnie Gull, MD 05/28/2024, 11:37 AM

## 2024-06-10 ENCOUNTER — Institutional Professional Consult (permissible substitution) (INDEPENDENT_AMBULATORY_CARE_PROVIDER_SITE_OTHER): Payer: Self-pay | Admitting: Otolaryngology

## 2024-06-11 ENCOUNTER — Inpatient Hospital Stay

## 2024-06-11 ENCOUNTER — Inpatient Hospital Stay: Attending: Physician Assistant

## 2024-06-11 DIAGNOSIS — R7989 Other specified abnormal findings of blood chemistry: Secondary | ICD-10-CM

## 2024-06-11 DIAGNOSIS — E559 Vitamin D deficiency, unspecified: Secondary | ICD-10-CM | POA: Diagnosis not present

## 2024-06-11 DIAGNOSIS — E876 Hypokalemia: Secondary | ICD-10-CM | POA: Insufficient documentation

## 2024-06-11 DIAGNOSIS — E538 Deficiency of other specified B group vitamins: Secondary | ICD-10-CM | POA: Diagnosis not present

## 2024-06-11 DIAGNOSIS — D45 Polycythemia vera: Secondary | ICD-10-CM | POA: Diagnosis present

## 2024-06-11 LAB — CBC WITH DIFFERENTIAL/PLATELET
Abs Immature Granulocytes: 0.02 K/uL (ref 0.00–0.07)
Basophils Absolute: 0 K/uL (ref 0.0–0.1)
Basophils Relative: 1 %
Eosinophils Absolute: 0.1 K/uL (ref 0.0–0.5)
Eosinophils Relative: 2 %
HCT: 40.7 % (ref 36.0–46.0)
Hemoglobin: 13.7 g/dL (ref 12.0–15.0)
Immature Granulocytes: 0 %
Lymphocytes Relative: 27 %
Lymphs Abs: 1.6 K/uL (ref 0.7–4.0)
MCH: 31.4 pg (ref 26.0–34.0)
MCHC: 33.7 g/dL (ref 30.0–36.0)
MCV: 93.1 fL (ref 80.0–100.0)
Monocytes Absolute: 0.5 K/uL (ref 0.1–1.0)
Monocytes Relative: 8 %
Neutro Abs: 3.6 K/uL (ref 1.7–7.7)
Neutrophils Relative %: 62 %
Platelets: 209 K/uL (ref 150–400)
RBC: 4.37 MIL/uL (ref 3.87–5.11)
RDW: 12.6 % (ref 11.5–15.5)
WBC: 5.7 K/uL (ref 4.0–10.5)
nRBC: 0 % (ref 0.0–0.2)

## 2024-06-11 LAB — COMPREHENSIVE METABOLIC PANEL WITH GFR
ALT: 8 U/L (ref 0–44)
AST: 10 U/L — ABNORMAL LOW (ref 15–41)
Albumin: 4 g/dL (ref 3.5–5.0)
Alkaline Phosphatase: 65 U/L (ref 38–126)
Anion gap: 7 (ref 5–15)
BUN: 5 mg/dL — ABNORMAL LOW (ref 6–20)
CO2: 27 mmol/L (ref 22–32)
Calcium: 8.9 mg/dL (ref 8.9–10.3)
Chloride: 105 mmol/L (ref 98–111)
Creatinine, Ser: 0.81 mg/dL (ref 0.44–1.00)
GFR, Estimated: >60 mL/min (ref >60)
Glucose, Bld: 69 mg/dL — ABNORMAL LOW (ref 70–99)
Potassium: 3 mmol/L — ABNORMAL LOW (ref 3.5–5.1)
Sodium: 139 mmol/L (ref 135–145)
Total Bilirubin: 0.8 mg/dL (ref 0.0–1.2)
Total Protein: 6.4 g/dL — ABNORMAL LOW (ref 6.5–8.1)

## 2024-06-11 LAB — VITAMIN B12: Vitamin B-12: 438 pg/mL (ref 180–914)

## 2024-06-11 LAB — VITAMIN D 25 HYDROXY (VIT D DEFICIENCY, FRACTURES): Vit D, 25-Hydroxy: 39.55 ng/mL (ref 30–100)

## 2024-06-14 LAB — METHYLMALONIC ACID, SERUM: Methylmalonic Acid, Quantitative: 108 nmol/L (ref 0–378)

## 2024-06-18 ENCOUNTER — Inpatient Hospital Stay: Admitting: Oncology

## 2024-06-19 ENCOUNTER — Ambulatory Visit: Admitting: Oncology

## 2024-06-24 ENCOUNTER — Inpatient Hospital Stay: Attending: Physician Assistant | Admitting: Oncology

## 2024-06-24 ENCOUNTER — Encounter: Payer: Self-pay | Admitting: Oncology

## 2024-06-24 VITALS — BP 111/69 | HR 71 | Temp 98.0°F | Resp 18 | Wt 118.2 lb

## 2024-06-24 DIAGNOSIS — E559 Vitamin D deficiency, unspecified: Secondary | ICD-10-CM | POA: Diagnosis not present

## 2024-06-24 DIAGNOSIS — K580 Irritable bowel syndrome with diarrhea: Secondary | ICD-10-CM | POA: Insufficient documentation

## 2024-06-24 DIAGNOSIS — E876 Hypokalemia: Secondary | ICD-10-CM | POA: Diagnosis not present

## 2024-06-24 DIAGNOSIS — Z7982 Long term (current) use of aspirin: Secondary | ICD-10-CM | POA: Diagnosis not present

## 2024-06-24 DIAGNOSIS — E538 Deficiency of other specified B group vitamins: Secondary | ICD-10-CM | POA: Diagnosis not present

## 2024-06-24 DIAGNOSIS — D45 Polycythemia vera: Secondary | ICD-10-CM | POA: Insufficient documentation

## 2024-06-24 DIAGNOSIS — H548 Legal blindness, as defined in USA: Secondary | ICD-10-CM | POA: Insufficient documentation

## 2024-06-24 NOTE — Assessment & Plan Note (Addendum)
-  Was prescribed ergocalciferol  50,000 units weekly but unfortunately this caused increased bone pain. -We discussed starting OTC vitamin D  2000 units daily to see if she could tolerate. -Repeat vitamin D  level is 39.55 (14.46). -Continue OTC vitamin D .

## 2024-06-24 NOTE — Assessment & Plan Note (Addendum)
-  Labs from 06/11/2024 shows a hemoglobin of 13.7 with normal differential.  Hematocrit 40.7.  Platelet count 209.  Differential is normal. -Reports hot flashes, night sweats and bone pains when her hematocrit is greater than 42. -She continues to take 81 mg aspirin  daily. -She takes tramadol  about 3 times per week for bone pain related to polycythemia. - Reports some numbness and tingling with achy legs over the past few months but unsure if it is related to her poor orthopedic problems versus her polycythemia. -Last phlebotomy was in July 2024. -We discussed hematocrit is less than 42 and would recommend waiting on phlebotomy until after she sees orthopedics next week.  If symptoms are still persistent despite Ortho repeated intervention return to clinic to discuss phlebotomy. -Otherwise, return to clinic in 3-4 months with labs and follow-up.

## 2024-06-24 NOTE — Assessment & Plan Note (Addendum)
-  Vitamin B12 level on 07/04/2023 was 100.  She was started on B12 complex supplements. -Repeat B12 level on 10/18/2023 was 445 and 446 on 02/07/24. -B12 level from 06/11/2024 is 438. -Continue B12 supplements.

## 2024-06-24 NOTE — Assessment & Plan Note (Addendum)
-  Likley secondary from diarrhea  - Reports she has not been taking potassium as she should. -Most recent potassium level was 3.0. -Discussed trying to consistently take her potassium tablets.

## 2024-06-24 NOTE — Progress Notes (Signed)
 [facility] OFFICE PROGRESS NOTE  Bertell Satterfield, MD  ASSESSMENT & PLAN:  Assessment & Plan Polycythemia vera (HCC) -Labs from 06/11/2024 shows a hemoglobin of 13.7 with normal differential.  Hematocrit 40.7.  Platelet count 209.  Differential is normal. -Reports hot flashes, night sweats and bone pains when her hematocrit is greater than 42. -She continues to take 81 mg aspirin  daily. -She takes tramadol  about 3 times per week for bone pain related to polycythemia. - Reports some numbness and tingling with achy legs over the past few months but unsure if it is related to her poor orthopedic problems versus her polycythemia. -Last phlebotomy was in July 2024. -We discussed hematocrit is less than 42 and would recommend waiting on phlebotomy until after she sees orthopedics next week.  If symptoms are still persistent despite Ortho repeated intervention return to clinic to discuss phlebotomy. -Otherwise, return to clinic in 3-4 months with labs and follow-up.  Hypokalemia -Likley secondary from diarrhea  - Reports she has not been taking potassium as she should. -Most recent potassium level was 3.0. -Discussed trying to consistently take her potassium tablets. Vitamin D  deficiency -Was prescribed ergocalciferol  50,000 units weekly but unfortunately this caused increased bone pain. -We discussed starting OTC vitamin D  2000 units daily to see if she could tolerate. -Repeat vitamin D  level is 39.55 (14.46). -Continue OTC vitamin D . Vitamin B12 deficiency disease -Vitamin B12 level on 07/04/2023 was 100.  She was started on B12 complex supplements. -Repeat B12 level on 10/18/2023 was 445 and 446 on 02/07/24. -B12 level from 06/11/2024 is 438. -Continue B12 supplements.    PLAN SUMMARY: >> No phlebotomy needed at this time. >> Consistently take 20 mill equivalents potassium daily. >> Continue B12 supplements daily and calcium/vitamin D  every other day. >> Follow-up after  orthopedics if leg heaviness and pain does not resolve with intervention. >> Return to clinic in 3 months with labs a few days before and virtual visit.     Orders Placed This Encounter  Procedures   Vitamin D  25 hydroxy    Standing Status:   Future    Expected Date:   09/24/2024    Expiration Date:   12/23/2024   Methylmalonic acid, serum    Standing Status:   Future    Expected Date:   09/24/2024    Expiration Date:   12/23/2024   Vitamin B12    Standing Status:   Future    Expected Date:   09/24/2024    Expiration Date:   12/23/2024   Comprehensive metabolic panel    Standing Status:   Future    Expected Date:   09/24/2024    Expiration Date:   12/23/2024   CBC with Differential    Standing Status:   Future    Expected Date:   09/24/2024    Expiration Date:   12/23/2024   History of present illness/interval history:  Since her last visit, she reports doing fair.  Reports back, foot and leg that has been on and off for the last several months.  She is followed by orthopedics and is scheduled to have a cortisone injection in her right foot in the near future.  She will eventually require a surgery for her right foot.  Reports that she is unsure if she needs a phlebotomy at this time because typically before a phlebotomy she has numbness and tingling in bilateral lower extremities but she feels like her orthopedic problems are mimicking her polycythemia problems.  She is scheduled for an appointment next week and would like to hold off on a phlebotomy to ensure it is not orthopedic related.  She denies any bleeding, bright red blood per rectum and or melena.  No hematochezia.  Most recent endoscopy is from 03/27/2023 which revealed a 3 cm hiatal hernia erosive gastropathy with no stigmata of recent bleeding and normal duodenum.  Reports she has not been taking her potassium supplements as prescribed because the pills were too big.  She recently did her lab work and decided that she  needs to take it to get those levels up.  She is taking her vitamin D  and calcium every other day and B12 daily.  Since her last visit, she denies any hospitalizations, surgeries or changes in her baseline health.  Reports overall she feels well.  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  She has not needed a phlebotomy since 05/30/2023.  Has irritable bowel syndrome with chronic diarrhea with several episodes daily.    Observations/Objective: Review of Systems  Respiratory:  Positive for cough.   Gastrointestinal:  Positive for diarrhea.  Musculoskeletal:  Positive for back pain and joint pain.  Neurological:  Positive for sensory change.     Assessment and Plan: Physical Exam Constitutional:      Appearance: Normal appearance.  Cardiovascular:     Rate and Rhythm: Normal rate and regular rhythm.  Pulmonary:     Effort: Pulmonary effort is normal.     Breath sounds: Normal breath sounds.  Abdominal:     General: Bowel sounds are normal.     Palpations: Abdomen is soft.  Musculoskeletal:        General: No swelling. Normal range of motion.  Neurological:     Mental Status: She is alert and oriented to person, place, and time. Mental status is at baseline.       I discussed the assessment and treatment plan with the patient. The patient was provided an opportunity to ask questions and all were answered. The patient agreed with the plan and demonstrated an understanding of the instructions.   The patient was advised to call back or seek an in-person evaluation if the symptoms worsen or if the condition fails to improve as anticipated.  I spent 25 minutes dedicated to the care of this patient (face-to-face and non-face-to-face) on the date of the encounter to include what is described in the assessment and plan.    Delon FORBES Hope, NP

## 2024-06-25 ENCOUNTER — Encounter (HOSPITAL_COMMUNITY): Payer: Self-pay | Admitting: Psychiatry

## 2024-06-25 ENCOUNTER — Telehealth (INDEPENDENT_AMBULATORY_CARE_PROVIDER_SITE_OTHER): Admitting: Psychiatry

## 2024-06-25 DIAGNOSIS — F5105 Insomnia due to other mental disorder: Secondary | ICD-10-CM

## 2024-06-25 DIAGNOSIS — F321 Major depressive disorder, single episode, moderate: Secondary | ICD-10-CM

## 2024-06-25 DIAGNOSIS — F418 Other specified anxiety disorders: Secondary | ICD-10-CM | POA: Diagnosis not present

## 2024-06-25 MED ORDER — FLUOXETINE HCL 40 MG PO CAPS: 40.0000 mg | ORAL_CAPSULE | Freq: Every day | ORAL | 2 refills | Status: DC

## 2024-06-25 MED ORDER — CLONAZEPAM 0.5 MG PO TABS: 0.5000 mg | ORAL_TABLET | Freq: Four times a day (QID) | ORAL | 2 refills | Status: DC | PRN

## 2024-06-25 MED ORDER — TRAZODONE HCL 50 MG PO TABS: ORAL_TABLET | ORAL | 3 refills | Status: DC

## 2024-06-25 NOTE — Progress Notes (Signed)
 Virtual Visit via Video Note  I connected with Cheryl Cross on 06/25/24 at 11:00 AM EDT by a video enabled telemedicine application and verified that I am speaking with the correct person using two identifiers.  Location: Patient: home Provider: office   I discussed the limitations of evaluation and management by telemedicine and the availability of in person appointments. The patient expressed understanding and agreed to proceed.     I discussed the assessment and treatment plan with the patient. The patient was provided an opportunity to ask questions and all were answered. The patient agreed with the plan and demonstrated an understanding of the instructions.   The patient was advised to call back or seek an in-person evaluation if the symptoms worsen or if the condition fails to improve as anticipated.  I provided 20 minutes of non-face-to-face time during this encounter.   Barnie Gull, MD  [facility] MD/PA/NP OP Progress Note  06/25/2024 11:17 AM Cheryl Cross  MRN:  [id number]  Chief Complaint:  Chief Complaint  Patient presents with   Depression   Anxiety   Follow-up   HPI:  This patient is a 58 year old white female who lives alone in [place]. 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  Last time she seemed more depressed and we switched her Zoloft  to Prozac  40 mg daily.  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  She is getting a lot more help recently from services for the blind.  She was fitted for a cane.  She is going to be using her Software engineer more as she is having more trouble reading.  The patient does think the Prozac  is helping at least a little bit.  She is dealing with other issues such as chronic pain in her foot and the need for a foot surgery coming up.  She  is getting out with family and with a friend from high school.  She is sleeping fairly well most of the time.  The clonazepam  seems to be helping her anxiety.  She thinks since getting on Prozac  her anxiety has been better.  She denies any thoughts of self-harm or suicide Visit Diagnosis:    ICD-10-CM   1. Major depressive disorder, single episode, moderate (HCC)  F32.1     2. Insomnia secondary to depression with anxiety  F51.05 traZODone  (DESYREL ) 50 MG tablet   F41.8       Past Psychiatric History: Psychiatric hospitalization in 2013, otherwise outpatient treatment  Past Medical History:  Past Medical History:  Diagnosis Date   Abdominal pain, right upper quadrant 06/10/2018   Anxiety    Arthritis    Bursitis    Cone-rod dystrophy 2021   DDD (degenerative disc disease)    Depression    Fibromyalgia    Legally blind    Nausea and vomiting 02/26/2023   Obsessive-compulsive disorder    Overactive bladder    Polycythemia vera(238.4) 11/2011   PTSD (post-traumatic stress disorder)    PVC's (premature ventricular contractions)    pt. placed on heart monitor x 24hours, everything fine    Sesamoiditis 12/2015    Past Surgical History:  Procedure Laterality Date   ABDOMINAL HYSTERECTOMY     BIOPSY  01/04/2021   Procedure: BIOPSY;  Surgeon: Eartha Angelia Sieving, MD;  [facility];  Service: Gastroenterology;;  small bowel biopsy   BIOPSY  03/27/2023   Procedure: BIOPSY;  Surgeon: Eartha Angelia Sieving, MD;  [facility];  Service: Gastroenterology;;   COLONOSCOPY WITH PROPOFOL  N/A 01/04/2021   Procedure: COLONOSCOPY WITH PROPOFOL ;  Surgeon: Eartha Angelia Sieving, MD;  [facility];  Service: Gastroenterology;  Laterality: N/A;  1115   ESOPHAGOGASTRODUODENOSCOPY N/A 09/10/2014   Procedure: ESOPHAGOGASTRODUODENOSCOPY (EGD);  Surgeon: Claudis RAYMOND Rivet, MD;  [facility];  Service: Endoscopy;  Laterality: N/A;  130 - moved to 3:15 -  Ann to notify   ESOPHAGOGASTRODUODENOSCOPY (EGD) WITH PROPOFOL  N/A 01/04/2021   Procedure: ESOPHAGOGASTRODUODENOSCOPY (EGD) WITH PROPOFOL ;  Surgeon: Eartha Angelia Sieving, MD;  [facility];  Service: Gastroenterology;  Laterality: N/A;   ESOPHAGOGASTRODUODENOSCOPY (EGD) WITH PROPOFOL  N/A 03/27/2023   Procedure: ESOPHAGOGASTRODUODENOSCOPY (EGD) WITH PROPOFOL ;  Surgeon: Eartha Angelia Sieving, MD;  [facility];  Service: Gastroenterology;  Laterality: N/A;  9:00am;asa 2   LUMBAR FUSION     POLYPECTOMY  01/04/2021   Procedure: POLYPECTOMY INTESTINAL;  Surgeon: Eartha Angelia Sieving, MD;  [facility];  Service: Gastroenterology;;  cecal lesion; ascending colon polyp;    TOOTH EXTRACTION Left Aug 2016    Family Psychiatric History: See below  Family History:  Family History  Problem Relation Age of Onset   Cancer - Colon Mother        age 16   Anxiety disorder Mother    Diabetes Mother    Atrial fibrillation Father    Lung cancer Father    Bipolar disorder Sister    Atrial fibrillation Brother    Dementia Neg Hx    Drug abuse Neg Hx    Paranoid behavior Neg Hx    Schizophrenia Neg Hx    Seizures Neg Hx    Sexual abuse Neg Hx    Physical abuse Neg Hx     Social History:  Social History   Socioeconomic History   Marital status: Widowed    Spouse name: Not on file   Number of children: 0   Years of education: BA   Highest education level: Not on file  Occupational History   Occupation: [employer]- on leave (since 08/2020)  Tobacco Use   Smoking status: Every Day    Current packs/day: 1.00    Average packs/day: 1 pack/day for 27.6 years (27.6 ttl pk-yrs)    Types: Cigarettes    Start date: 11/12/1996    Passive exposure: Current   Smokeless tobacco: Never   Tobacco comments:    1 pack a day x 20 yrs.  Vaping Use   Vaping status: Never Used  Substance and Sexual Activity   Alcohol use: No    Alcohol/week:  0.0 standard drinks of alcohol   Drug use: No    Types: Hydrocodone, Benzodiazepines   Sexual activity: Not Currently  Other Topics Concern   Not on file  Social History Narrative   Right handed   2-3 glasses soda per day   Lives alone   Social Drivers of Health   Financial Resource Strain: Not on file  Food Insecurity: Not on file  Transportation Needs: Not on file  Physical Activity: Not on file  Stress: Not on file  Social Connections: Not on file    Allergies:  Allergies  Allergen Reactions   Gabapentin  Hives   Hydrocodone-Acetaminophen  Nausea Only   Penicillins Other (See Comments)    Unknown child hood reaction .Did it involve  swelling of the face/tongue/throat, SOB, or low BP? Unknown Did it involve sudden or severe rash/hives, skin peeling, or any reaction on the inside of your mouth or nose? Unknown Did you need to seek medical attention at a hospital or doctor's office? Unknown When did it last happen?       If all above answers are "NO", may proceed with cephalosporin use.     Metabolic Disorder Labs: No results found for: HGBA1C, MPG No results found for: PROLACTIN No results found for: CHOL, TRIG, HDL, CHOLHDL, VLDL, LDLCALC Lab Results  Component Value Date   TSH 2.387 07/04/2023   TSH 1.770 10/13/2020    Therapeutic Level Labs: No results found for: LITHIUM No results found for: VALPROATE No results found for: CBMZ  Current Medications: Current Outpatient Medications  Medication Sig Dispense Refill   cholecalciferol  (VITAMIN D3) 25 MCG (1000 UNIT) tablet Take 2 tablets (2,000 Units total) by mouth daily. (Patient taking differently: Take 2,000 Units by mouth daily. Takes 2-3 weekly) 120 tablet 2   clonazePAM  (KLONOPIN ) 0.5 MG tablet Take 1 tablet (0.5 mg total) by mouth 4 (four) times daily as needed for anxiety. 120 tablet 2   diclofenac Sodium (VOLTAREN) 1 % GEL Apply 1 Application topically 4 (four) times daily as  needed (pain. (applied to foot area)).     FLUoxetine  (PROZAC ) 40 MG capsule Take 1 capsule (40 mg total) by mouth daily. 30 capsule 2   meclizine (ANTIVERT) 25 MG tablet Take 25 mg by mouth 4 (four) times daily as needed.     methocarbamol  (ROBAXIN ) 500 MG tablet Take 250 mg by mouth every 8 (eight) hours as needed for muscle spasms.     oxyCODONE (OXY IR/ROXICODONE) 5 MG immediate release tablet Take 1 tablet every 4 hours by oral route as needed for 5 days, for severe pain.     potassium chloride  SA (KLOR-CON  M) 20 MEQ tablet Take 1 tablet (20 mEq total) by mouth daily. 90 tablet 0   traMADol  (ULTRAM ) 50 MG tablet Take 1 tablet (50 mg total) by mouth daily as needed for severe pain. 30 tablet 0   traZODone  (DESYREL ) 50 MG tablet TAKE 1 TABLET(50 MG) BY MOUTH AT BEDTIME 30 tablet 3   vitamin B-12 (CYANOCOBALAMIN ) 100 MCG tablet Take 100 mcg by mouth daily.     No current facility-administered medications for this visit.     Musculoskeletal: Strength & Muscle Tone: within normal limits Gait & Station: normal Patient leans: N/A  Psychiatric Specialty Exam: Review of Systems  Eyes:  Positive for visual disturbance.  Musculoskeletal:  Positive for arthralgias.  All other systems reviewed and are negative.   There were no vitals taken for this visit.There is no height or weight on file to calculate BMI.  General Appearance: Casual and Fairly Groomed  Eye Contact:  Good  Speech:  Clear and Coherent  Volume:  Normal  Mood:  Anxious and Euthymic  Affect:  Congruent  Thought Process:  Goal Directed  Orientation:  Full (Time, Place, and Person)  Thought Content: Rumination   Suicidal Thoughts:  No  Homicidal Thoughts:  No  Memory:  Immediate;   Good Recent;   Good Remote;   Good  Judgement:  Good  Insight:  Good  Psychomotor Activity:  Decreased  Concentration:  Concentration: Good and Attention Span: Good  Recall:  Good  Fund of Knowledge: Good  Language: Good  Akathisia:  No   Handed:  Right  AIMS (if indicated): not done  Assets:  Communication Skills Desire for Improvement Resilience Social Support  ADL's:  Intact  Cognition: WNL  Sleep:  Fair   Screenings: GAD-7    Garment/textile technologist Visit from 05/22/2023 in [facility] [place] [facility] from 01/13/2021 in [facility] at [facility]  Total GAD-7 Score 21 17   PHQ2-9    Flowsheet Row Office Visit from 06/24/2024 in [facility] [place] [facility] [place] Video Visit from 10/29/2023 in [facility] at [place] Counselor from 10/08/2023 in [facility] at [place] Office Visit from 05/22/2023 in [facility] at [place] Video Visit from 01/14/2023 in [facility] at [facility] Total Score 4 4 2 6 2   PHQ-9 Total Score 16 16 12 18 6    Flowsheet Row Video Visit from 10/29/2023 in [facility] at [place] Counselor from 10/08/2023 in [facility] at [place] Office Visit from 05/22/2023 in [facility] at [place]  C-SSRS RISK CATEGORY No Risk No Risk Error: Question 6 not populated     Assessment and Plan: This patient is a 58 year old female with a 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  She seems to be doing a little bit better with Prozac  40 mg daily so this will be continued.  She will also continue trazodone  50 mg at bedtime for sleep and clonazepam  0.5 mg 3 times daily as needed for anxiety.  She will return to see me in 2 months  Collaboration of Care: Collaboration of Care: Primary Care Provider AEB notes to be shared with PCP at patient's request  Patient/Guardian was advised Release of Information must be obtained prior to any record release in order to  collaborate their care with an outside provider. Patient/Guardian was advised if they have not already done so to contact the registration department to sign all necessary forms in order for us  to release information regarding their care.   Consent: Patient/Guardian gives verbal consent for treatment and assignment of benefits for services provided during this visit. Patient/Guardian expressed understanding and agreed to proceed.    Barnie Gull, MD 06/25/2024, 11:17 AM

## 2024-07-01 ENCOUNTER — Ambulatory Visit: Admitting: Physician Assistant

## 2024-07-09 ENCOUNTER — Other Ambulatory Visit: Payer: Self-pay

## 2024-07-09 DIAGNOSIS — D45 Polycythemia vera: Secondary | ICD-10-CM

## 2024-07-14 ENCOUNTER — Inpatient Hospital Stay

## 2024-07-14 ENCOUNTER — Inpatient Hospital Stay: Attending: Physician Assistant

## 2024-07-14 DIAGNOSIS — D45 Polycythemia vera: Secondary | ICD-10-CM | POA: Diagnosis present

## 2024-07-14 LAB — CBC WITH DIFFERENTIAL/PLATELET
Abs Immature Granulocytes: 0.02 K/uL (ref 0.00–0.07)
Basophils Absolute: 0 K/uL (ref 0.0–0.1)
Basophils Relative: 1 %
Eosinophils Absolute: 0.1 K/uL (ref 0.0–0.5)
Eosinophils Relative: 2 %
HCT: 39.2 % (ref 36.0–46.0)
Hemoglobin: 13.5 g/dL (ref 12.0–15.0)
Immature Granulocytes: 0 %
Lymphocytes Relative: 38 %
Lymphs Abs: 2 K/uL (ref 0.7–4.0)
MCH: 31.5 pg (ref 26.0–34.0)
MCHC: 34.4 g/dL (ref 30.0–36.0)
MCV: 91.4 fL (ref 80.0–100.0)
Monocytes Absolute: 0.4 K/uL (ref 0.1–1.0)
Monocytes Relative: 7 %
Neutro Abs: 2.8 K/uL (ref 1.7–7.7)
Neutrophils Relative %: 52 %
Platelets: 237 K/uL (ref 150–400)
RBC: 4.29 MIL/uL (ref 3.87–5.11)
RDW: 11.9 % (ref 11.5–15.5)
WBC: 5.3 K/uL (ref 4.0–10.5)
nRBC: 0 % (ref 0.0–0.2)

## 2024-07-14 NOTE — Progress Notes (Signed)
 Hemoglobin today is 13.5 and hematocrit is 39.2.  We will hold phlebotomy today.

## 2024-08-19 ENCOUNTER — Other Ambulatory Visit: Payer: Self-pay | Admitting: *Deleted

## 2024-08-19 MED ORDER — POTASSIUM CHLORIDE CRYS ER 20 MEQ PO TBCR: 20.0000 meq | EXTENDED_RELEASE_TABLET | Freq: Every day | ORAL | 0 refills | Status: AC

## 2024-08-25 ENCOUNTER — Telehealth (HOSPITAL_COMMUNITY): Admitting: Psychiatry

## 2024-08-26 ENCOUNTER — Encounter (INDEPENDENT_AMBULATORY_CARE_PROVIDER_SITE_OTHER): Payer: Self-pay | Admitting: Gastroenterology

## 2024-09-03 ENCOUNTER — Telehealth (INDEPENDENT_AMBULATORY_CARE_PROVIDER_SITE_OTHER): Payer: Self-pay | Admitting: Psychiatry

## 2024-09-03 ENCOUNTER — Encounter (HOSPITAL_COMMUNITY): Payer: Self-pay | Admitting: Psychiatry

## 2024-09-03 DIAGNOSIS — F321 Major depressive disorder, single episode, moderate: Secondary | ICD-10-CM

## 2024-09-03 MED ORDER — FLUOXETINE HCL 40 MG PO CAPS: 40.0000 mg | ORAL_CAPSULE | Freq: Every day | ORAL | 2 refills | Status: DC

## 2024-09-03 MED ORDER — CLONAZEPAM 0.5 MG PO TABS: 0.5000 mg | ORAL_TABLET | Freq: Four times a day (QID) | ORAL | 2 refills | Status: DC | PRN

## 2024-09-03 NOTE — Progress Notes (Signed)
 Virtual Visit via Video Note  I connected with Cheryl Cross on 09/03/24 at  1:20 PM EDT by a video enabled telemedicine application and verified that I am speaking with the correct person using two identifiers.  Location: Patient: home Provider: office   I discussed the limitations of evaluation and management by telemedicine and the availability of in person appointments. The patient expressed understanding and agreed to proceed.    I discussed the assessment and treatment plan with the patient. The patient was provided an opportunity to ask questions and all were answered. The patient agreed with the plan and demonstrated an understanding of the instructions.   The patient was advised to call back or seek an in-person evaluation if the symptoms worsen or if the condition fails to improve as anticipated.  I provided 20 minutes of non-face-to-face time during this encounter.   Barnie Gull, MD  [facility] MD/PA/NP OP Progress Note  09/03/2024 1:39 PM Cheryl Cross  MRN:  [id number]  Chief Complaint:  Chief Complaint  Patient presents with   Anxiety   Depression   Follow-up   HPI: This patient is a 58 year old white female who lives alone in [place]. 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  She does still struggle with Stargardt's disease and it seems to be getting gradually worse.  She is using visual aids.  [place] and [place] is going to try to move closer into [place] her [place] next spring so she has more options for transportation and food delivery.  She recently was seen at [facility] where they are doing research on this disorder but hers is too progressive to be treated by any of the trials.  Despite this she states her mood has been pretty  stable although she does get depressed about her visual loss.  This seems to make sense at this point.  However if she is trying to move forward and do the best she can.  She is eating fairly well and sleeping fairly well Visit Diagnosis:    ICD-10-CM   1. Major depressive disorder, single episode, moderate (HCC)  F32.1       Past Psychiatric History: Psychiatric hospitalization in 2013, otherwise outpatient treatment  Past Medical History:  Past Medical History:  Diagnosis Date   Abdominal pain, right upper quadrant 06/10/2018   Anxiety    Arthritis    Bursitis    Cone-rod dystrophy 2021   DDD (degenerative disc disease)    Depression    Fibromyalgia    Legally blind    Nausea and vomiting 02/26/2023   Obsessive-compulsive disorder    Overactive bladder    Polycythemia vera(238.4) 11/2011   PTSD (post-traumatic stress disorder)    PVC's (premature ventricular contractions)    pt. placed on heart monitor x 24hours, everything fine    Sesamoiditis 12/2015    Past Surgical History:  Procedure Laterality Date   ABDOMINAL HYSTERECTOMY     BIOPSY  01/04/2021   Procedure: BIOPSY;  Surgeon: Eartha Angelia Sieving, MD;  [facility];  Service: Gastroenterology;;  small bowel biopsy   BIOPSY  03/27/2023   Procedure: BIOPSY;  Surgeon: Eartha Angelia Sieving, MD;  [facility];  Service: Gastroenterology;;   COLONOSCOPY WITH PROPOFOL  N/A 01/04/2021  Procedure: COLONOSCOPY WITH PROPOFOL ;  Surgeon: Eartha Angelia Sieving, MD;  [facility];  Service: Gastroenterology;  Laterality: N/A;  1115   ESOPHAGOGASTRODUODENOSCOPY N/A 09/10/2014   Procedure: ESOPHAGOGASTRODUODENOSCOPY (EGD);  Surgeon: Claudis RAYMOND Rivet, MD;  [facility];  Service: Endoscopy;  Laterality: N/A;  130 - moved to 3:15 - Ann to notify   ESOPHAGOGASTRODUODENOSCOPY (EGD) WITH PROPOFOL  N/A 01/04/2021   Procedure: ESOPHAGOGASTRODUODENOSCOPY (EGD) WITH PROPOFOL ;  Surgeon:  Eartha Angelia Sieving, MD;  [facility];  Service: Gastroenterology;  Laterality: N/A;   ESOPHAGOGASTRODUODENOSCOPY (EGD) WITH PROPOFOL  N/A 03/27/2023   Procedure: ESOPHAGOGASTRODUODENOSCOPY (EGD) WITH PROPOFOL ;  Surgeon: Eartha Angelia Sieving, MD;  [facility];  Service: Gastroenterology;  Laterality: N/A;  9:00am;asa 2   LUMBAR FUSION     POLYPECTOMY  01/04/2021   Procedure: POLYPECTOMY INTESTINAL;  Surgeon: Eartha Angelia Sieving, MD;  [facility];  Service: Gastroenterology;;  cecal lesion; ascending colon polyp;    TOOTH EXTRACTION Left Aug 2016    Family Psychiatric History: See below  Family History:  Family History  Problem Relation Age of Onset   Cancer - Colon Mother        age 62   Anxiety disorder Mother    Diabetes Mother    Atrial fibrillation Father    Lung cancer Father    Bipolar disorder Sister    Atrial fibrillation Brother    Dementia Neg Hx    Drug abuse Neg Hx    Paranoid behavior Neg Hx    Schizophrenia Neg Hx    Seizures Neg Hx    Sexual abuse Neg Hx    Physical abuse Neg Hx     Social History:  Social History   Socioeconomic History   Marital status: Widowed    Spouse name: Not on file   Number of children: 0   Years of education: BA   Highest education level: Not on file  Occupational History   Occupation: [employer]- on leave (since 08/2020)  Tobacco Use   Smoking status: Every Day    Current packs/day: 1.00    Average packs/day: 1 pack/day for 27.8 years (27.8 ttl pk-yrs)    Types: Cigarettes    Start date: 11/12/1996    Passive exposure: Current   Smokeless tobacco: Never   Tobacco comments:    1 pack a day x 20 yrs.  Vaping Use   Vaping status: Never Used  Substance and Sexual Activity   Alcohol use: No    Alcohol/week: 0.0 standard drinks of alcohol   Drug use: No    Types: Hydrocodone, Benzodiazepines   Sexual activity: Not Currently  Other Topics Concern   Not  on file  Social History Narrative   Right handed   2-3 glasses soda per day   Lives alone   Social Drivers of Health   Financial Resource Strain: Not on file  Food Insecurity: Not on file  Transportation Needs: Not on file  Physical Activity: Not on file  Stress: Not on file  Social Connections: Not on file    Allergies:  Allergies  Allergen Reactions   Gabapentin  Hives   Hydrocodone-Acetaminophen  Nausea Only   Penicillins Other (See Comments)    Unknown child hood reaction .Did it involve swelling of the face/tongue/throat, SOB, or low BP? Unknown Did it involve sudden or severe rash/hives, skin peeling, or any reaction on the inside of your mouth or nose? Unknown Did you need to seek medical attention at  a hospital or doctor's office? Unknown When did it last happen?       If all above answers are "NO", may proceed with cephalosporin use.     Metabolic Disorder Labs: No results found for: HGBA1C, MPG No results found for: PROLACTIN No results found for: CHOL, TRIG, HDL, CHOLHDL, VLDL, LDLCALC Lab Results  Component Value Date   TSH 2.387 07/04/2023   TSH 1.770 10/13/2020    Therapeutic Level Labs: No results found for: LITHIUM No results found for: VALPROATE No results found for: CBMZ  Current Medications: Current Outpatient Medications  Medication Sig Dispense Refill   cholecalciferol  (VITAMIN D3) 25 MCG (1000 UNIT) tablet Take 2 tablets (2,000 Units total) by mouth daily. (Patient taking differently: Take 2,000 Units by mouth daily. Takes 2-3 weekly) 120 tablet 2   clonazePAM  (KLONOPIN ) 0.5 MG tablet Take 1 tablet (0.5 mg total) by mouth 4 (four) times daily as needed for anxiety. 120 tablet 2   diclofenac Sodium (VOLTAREN) 1 % GEL Apply 1 Application topically 4 (four) times daily as needed (pain. (applied to foot area)).     FLUoxetine  (PROZAC ) 40 MG capsule Take 1 capsule (40 mg total) by mouth daily. 30 capsule 2   meclizine  (ANTIVERT) 25 MG tablet Take 25 mg by mouth 4 (four) times daily as needed.     methocarbamol  (ROBAXIN ) 500 MG tablet Take 250 mg by mouth every 8 (eight) hours as needed for muscle spasms.     oxyCODONE (OXY IR/ROXICODONE) 5 MG immediate release tablet Take 1 tablet every 4 hours by oral route as needed for 5 days, for severe pain.     potassium chloride  SA (KLOR-CON  M) 20 MEQ tablet Take 1 tablet (20 mEq total) by mouth daily. 90 tablet 0   traMADol  (ULTRAM ) 50 MG tablet Take 1 tablet (50 mg total) by mouth daily as needed for severe pain. 30 tablet 0   traZODone  (DESYREL ) 50 MG tablet TAKE 1 TABLET(50 MG) BY MOUTH AT BEDTIME 30 tablet 3   vitamin B-12 (CYANOCOBALAMIN ) 100 MCG tablet Take 100 mcg by mouth daily.     No current facility-administered medications for this visit.     Musculoskeletal: Strength & Muscle Tone: within normal limits Gait & Station: normal Patient leans: N/A  Psychiatric Specialty Exam: Review of Systems  Eyes:  Positive for visual disturbance.  Musculoskeletal:  Positive for arthralgias.  All other systems reviewed and are negative.   There were no vitals taken for this visit.There is no height or weight on file to calculate BMI.  General Appearance: Casual and Fairly Groomed  Eye Contact:  Good  Speech:  Clear and Coherent  Volume:  Normal  Mood:  Anxious and Euthymic  Affect:  Congruent  Thought Process:  Goal Directed  Orientation:  Full (Time, Place, and Person)  Thought Content: WDL   Suicidal Thoughts:  No  Homicidal Thoughts:  No  Memory:  Immediate;   Good Recent;   Good Remote;   Fair  Judgement:  Good  Insight:  Good  Psychomotor Activity:  Normal  Concentration:  Concentration: Good and Attention Span: Good  Recall:  Good  Fund of Knowledge: Good  Language: Good  Akathisia:  No  Handed:  Right  AIMS (if indicated): not done  Assets:  Communication Skills Desire for Improvement Resilience Social Support  ADL's:  Intact   Cognition: WNL  Sleep:  Good   Screenings: GAD-7    Flowsheet Row Office Visit from 05/22/2023 in [facility] at [facility] from 01/13/2021 in [facility] at [facility]  Total GAD-7 Score 21 17   PHQ2-9    Flowsheet Row Office Visit from 06/24/2024 in [facility] [place] Video Visit from 10/29/2023 in [facility] at [place] Counselor from 10/08/2023 in [facility] Outpatient Behavior[facility] at [place] Office Visit from 05/22/2023 in [facility] at [place] Video Visit from 01/14/2023 in [facility] at [facility] Total Score 4 4 2 6 2   PHQ-9 Total Score 16 16 12 18 6    Flowsheet Row Video Visit from 10/29/2023 in [facility] Outpatient [facility]al [facility] at [place] Counselor from 10/08/2023 in [facility] at [place] Office Visit from 05/22/2023 in [facility] Outpatient Behavioral [facility] at [place]  C-SSRS RISK CATEGORY No Risk No Risk Error: Question 6 not populated     Assessment and Plan: This patient is a 58 year old female with a history of substance abuse in remission, major depressive disorder and severe visual impairment.  For the most part given the circumstances she is doing fairly well.  She will continue Prozac  40 mg daily for major depression and clonazepam  0.5 mg 3 times daily as needed for anxiety.  She will return to see me in 3 months  Collaboration of Care: Collaboration of Care: Primary Care Provider AEB notes to be shared with PCP at patient's request  Patient/Guardian was advised Release of Information must be obtained prior to any record release in order to collaborate their care with an outside provider. Patient/Guardian was advised if they have not already done so to contact the registration department to sign all  necessary forms in order for us  to release information regarding their care.   Consent: Patient/Guardian gives verbal consent for treatment and assignment of benefits for services provided during this visit. Patient/Guardian expressed understanding and agreed to proceed.    Barnie Gull, MD 09/03/2024, 1:39 PM

## 2024-09-15 ENCOUNTER — Other Ambulatory Visit (HOSPITAL_COMMUNITY): Payer: Self-pay | Admitting: Psychiatry

## 2024-09-15 ENCOUNTER — Telehealth (HOSPITAL_COMMUNITY): Payer: Self-pay | Admitting: *Deleted

## 2024-09-15 MED ORDER — FLUOXETINE HCL 20 MG PO CAPS: 20.0000 mg | ORAL_CAPSULE | Freq: Every day | ORAL | 2 refills | Status: DC

## 2024-09-15 NOTE — Telephone Encounter (Signed)
 Per pt she would like for provider to please increase her Prozac . Per pt she is currently taking 40mg . Per pt she would like it increased because she's been feeling down more then she wants to be. Per pt she was instructed by provider to call office back if she feels like she needs to increase medication. Pharmacy is [facility].

## 2024-09-15 NOTE — Telephone Encounter (Signed)
 Additional 20 mg of prozac  sent in

## 2024-09-16 ENCOUNTER — Encounter (HOSPITAL_COMMUNITY): Payer: Self-pay | Admitting: Hematology and Oncology

## 2024-09-21 NOTE — Telephone Encounter (Signed)
 Patient unformed and she verbalized understanding

## 2024-09-23 ENCOUNTER — Inpatient Hospital Stay: Attending: Physician Assistant

## 2024-09-23 DIAGNOSIS — D45 Polycythemia vera: Secondary | ICD-10-CM | POA: Diagnosis present

## 2024-09-23 DIAGNOSIS — E559 Vitamin D deficiency, unspecified: Secondary | ICD-10-CM | POA: Insufficient documentation

## 2024-09-23 DIAGNOSIS — E538 Deficiency of other specified B group vitamins: Secondary | ICD-10-CM | POA: Diagnosis not present

## 2024-09-23 DIAGNOSIS — Z79899 Other long term (current) drug therapy: Secondary | ICD-10-CM | POA: Diagnosis not present

## 2024-09-23 LAB — COMPREHENSIVE METABOLIC PANEL WITH GFR
ALT: 9 U/L (ref 0–44)
AST: 20 U/L (ref 15–41)
Albumin: 4.4 g/dL (ref 3.5–5.0)
Alkaline Phosphatase: 78 U/L (ref 38–126)
Anion gap: 11 (ref 5–15)
BUN: <5 mg/dL — ABNORMAL LOW (ref 6–20)
CO2: 28 mmol/L (ref 22–32)
Calcium: 8.8 mg/dL — ABNORMAL LOW (ref 8.9–10.3)
Chloride: 99 mmol/L (ref 98–111)
Creatinine, Ser: 0.88 mg/dL (ref 0.44–1.00)
GFR, Estimated: >60 mL/min (ref >60)
Glucose, Bld: 72 mg/dL (ref 70–99)
Potassium: 3.4 mmol/L — ABNORMAL LOW (ref 3.5–5.1)
Sodium: 137 mmol/L (ref 135–145)
Total Bilirubin: 0.4 mg/dL (ref 0.0–1.2)
Total Protein: 6.8 g/dL (ref 6.5–8.1)

## 2024-09-23 LAB — CBC WITH DIFFERENTIAL/PLATELET
Abs Immature Granulocytes: 0.03 K/uL (ref 0.00–0.07)
Basophils Absolute: 0.1 K/uL (ref 0.0–0.1)
Basophils Relative: 1 %
Eosinophils Absolute: 0.1 K/uL (ref 0.0–0.5)
Eosinophils Relative: 1 %
HCT: 38.3 % (ref 36.0–46.0)
Hemoglobin: 13.4 g/dL (ref 12.0–15.0)
Immature Granulocytes: 0 %
Lymphocytes Relative: 28 %
Lymphs Abs: 2 K/uL (ref 0.7–4.0)
MCH: 31.6 pg (ref 26.0–34.0)
MCHC: 35 g/dL (ref 30.0–36.0)
MCV: 90.3 fL (ref 80.0–100.0)
Monocytes Absolute: 0.4 K/uL (ref 0.1–1.0)
Monocytes Relative: 6 %
Neutro Abs: 4.4 K/uL (ref 1.7–7.7)
Neutrophils Relative %: 64 %
Platelets: 290 K/uL (ref 150–400)
RBC: 4.24 MIL/uL (ref 3.87–5.11)
RDW: 12.2 % (ref 11.5–15.5)
WBC: 7 K/uL (ref 4.0–10.5)
nRBC: 0 % (ref 0.0–0.2)

## 2024-09-23 LAB — VITAMIN D 25 HYDROXY (VIT D DEFICIENCY, FRACTURES): Vit D, 25-Hydroxy: 33.74 ng/mL (ref 30–100)

## 2024-09-23 LAB — VITAMIN B12: Vitamin B-12: 696 pg/mL (ref 180–914)

## 2024-09-30 ENCOUNTER — Inpatient Hospital Stay: Admitting: Oncology

## 2024-09-30 ENCOUNTER — Other Ambulatory Visit: Payer: Self-pay | Admitting: Oncology

## 2024-09-30 DIAGNOSIS — E538 Deficiency of other specified B group vitamins: Secondary | ICD-10-CM

## 2024-09-30 DIAGNOSIS — D45 Polycythemia vera: Secondary | ICD-10-CM | POA: Diagnosis not present

## 2024-09-30 DIAGNOSIS — E876 Hypokalemia: Secondary | ICD-10-CM | POA: Diagnosis not present

## 2024-09-30 DIAGNOSIS — E559 Vitamin D deficiency, unspecified: Secondary | ICD-10-CM | POA: Diagnosis not present

## 2024-09-30 MED ORDER — TRAMADOL HCL 50 MG PO TABS: 50.0000 mg | ORAL_TABLET | Freq: Every day | ORAL | 0 refills | Status: AC | PRN

## 2024-09-30 MED ORDER — OXYCODONE HCL 5 MG PO TABS: 5.0000 mg | ORAL_TABLET | Freq: Four times a day (QID) | ORAL | 0 refills | Status: DC | PRN

## 2024-09-30 NOTE — Addendum Note (Signed)
 Addended by: GEOFM NEST E on: 09/30/2024 01:31 PM   Modules accepted: Orders

## 2024-09-30 NOTE — Progress Notes (Signed)
 [facility] OFFICE PROGRESS NOTE  I connected with Cheryl Cross on 09/30/24 at  9:30 AM EST by telephone visit and verified that I am speaking with the correct person using two identifiers.   I discussed the limitations, risks, security and privacy concerns of performing an evaluation and management service by telemedicine and the availability of in-person appointments. I also discussed with the patient that there may be a patient responsible charge related to this service. The patient expressed understanding and agreed to proceed.   Other persons participating in the visit and their role in the encounter: NP, Patient   Patient's location: Home Provider's location: Clinic    Bertell Satterfield, MD  ASSESSMENT & PLAN:  Assessment & Plan Polycythemia vera (HCC) -Labs from 09/23/2024 shows a hemoglobin of 13.4 with normal differential.  Hematocrit 38.3.  Platelet count 290.  Differential is normal. -Reports hot flashes, night sweats and bone pains when her hematocrit is greater than 42. -She continues to take 81 mg aspirin  daily. -She takes tramadol  about 3 times per week for bone pain related to polycythemia. - Reports some numbness and tingling with achy legs over the past few months but unsure if it is related to her poor orthopedic problems versus her polycythemia. -Last phlebotomy was in July 2024. -We discussed hematocrit is less than 42 and would recommend waiting on phlebotomy.   If symptoms are still persistent despite Ortho intervention return to clinic to discuss phlebotomy. -Otherwise, return to clinic in 6 months with labs and follow-up.  Vitamin D  deficiency -Was prescribed ergocalciferol  50,000 units weekly but unfortunately this caused increased bone pain. -We discussed starting OTC vitamin D  2000 units daily to see if she could tolerate. -Repeat vitamin D  level is 33.74. -Continue OTC vitamin D . Vitamin B12 deficiency disease -Vitamin B12 level on  07/04/2023 was 100.  She was started on B12 complex supplements. -Repeat B12 level on 10/18/2023 was 445 and 446 on 02/07/24. -B12 level from 06/11/2024 is 438. -B12 level from 09/23/2024 is 696. -Continue B12 supplements.  Hypokalemia -Likley secondary from diarrhea  - Reports she has not been taking potassium as she should. -Most recent potassium level was 3.4. -Continue potassium supplements.   PLAN SUMMARY: >> No phlebotomy needed at this time. >> Continue potassium supplements. >> Continue B12 supplements daily and calcium/vitamin D  every other day. >> Return to clinic in 5 months with labs a few days before and virtual visit.     Orders Placed This Encounter  Procedures   Vitamin B12    Standing Status:   Future    Expected Date:   02/28/2025    Expiration Date:   05/29/2025   Vitamin D  25 hydroxy    Standing Status:   Future    Expected Date:   02/28/2025    Expiration Date:   05/29/2025   Methylmalonic acid, serum    Standing Status:   Future    Expected Date:   02/28/2025    Expiration Date:   05/29/2025   Comprehensive metabolic panel    Standing Status:   Future    Expected Date:   02/28/2025    Expiration Date:   05/29/2025   CBC with Differential    Standing Status:   Future    Expected Date:   02/28/2025    Expiration Date:   05/29/2025   History of present illness/interval history:  Since her last visit, she reports doing fair.  Reports persistent back foot and leg pain.  Reports she sees orthopedics and receives injections in her back and foot.  She recently had foot surgery where she had a Morton's neuroma removed.  Surgery went well.  She is scheduled for follow-up on 10/13/2024. Reports that she is unsure if she needs a phlebotomy at this time because typically before a phlebotomy she has numbness and tingling in bilateral lower extremities but she feels like her orthopedic problems are mimicking her polycythemia problems.  Reports she takes tramadol  and oxycodone  for  polycythemia related achiness along with all of her orthopedic concerns.  She is started on gabapentin  but unfortunately was unable to tolerate as it caused bad dreams.  Since her last visit, she was seen by her psychiatrist for depression/anxiety disorder.  Prozac  was increased.  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  Reports she was seen by [facility] recently who are doing research on the disorder but hers is too progressive to be treated by any of the trials.  Gets depressed over visual loss.  She denies any bleeding, bright red blood per rectum and or melena.  No hematochezia.  Most recent endoscopy is from 03/27/2023 which revealed a 3 cm hiatal hernia erosive gastropathy with no stigmata of recent bleeding and normal duodenum.  She is taking vitamin D  and B12 supplements daily.  She is taking potassium a few times per week given the pills are so large.  Have diarrhea at times due to IBS.  Denies any interval hospitalizations.  She continues to care for her mom to her father passed away in 12-20-2023.  Reports having to move her mom around causes quite a bit of back pain which is why she uses the pain medicine.  She has not needed a phlebotomy since 05/30/2023.  Observations/Objective: Review of Systems  Constitutional:  Positive for malaise/fatigue.  Eyes:  Positive for blurred vision.  Gastrointestinal:  Positive for constipation and diarrhea.  Musculoskeletal:  Positive for back pain and joint pain. Negative for falls.  Neurological:  Positive for dizziness and tingling.  Psychiatric/Behavioral:  Positive for depression. The patient is nervous/anxious.      Assessment and Plan: Physical Exam Neurological:     Mental Status: She is alert and oriented to person, place, and time.       I discussed the assessment and treatment plan with the patient. The patient was provided an opportunity to ask questions and all were answered. The patient agreed with the  plan and demonstrated an understanding of the instructions.   The patient was advised to call back or seek an in-person evaluation if the symptoms worsen or if the condition fails to improve as anticipated.  I provided 18 minutes of non face-to-face telephone visit time during this encounter, and > 50% was spent counseling as documented under my assessment & plan.    Delon FORBES Hope, NP

## 2024-09-30 NOTE — Assessment & Plan Note (Addendum)
-  Vitamin B12 level on 07/04/2023 was 100.  She was started on B12 complex supplements. -Repeat B12 level on 10/18/2023 was 445 and 446 on 02/07/24. -B12 level from 06/11/2024 is 438. -B12 level from 09/23/2024 is 696. -Continue B12 supplements.

## 2024-09-30 NOTE — Assessment & Plan Note (Addendum)
-  Likley secondary from diarrhea  - Reports she has not been taking potassium as she should. -Most recent potassium level was 3.4. -Continue potassium supplements.

## 2024-09-30 NOTE — Assessment & Plan Note (Addendum)
-  Labs from 09/23/2024 shows a hemoglobin of 13.4 with normal differential.  Hematocrit 38.3.  Platelet count 290.  Differential is normal. -Reports hot flashes, night sweats and bone pains when her hematocrit is greater than 42. -She continues to take 81 mg aspirin  daily. -She takes tramadol  about 3 times per week for bone pain related to polycythemia. - Reports some numbness and tingling with achy legs over the past few months but unsure if it is related to her poor orthopedic problems versus her polycythemia. -Last phlebotomy was in July 2024. -We discussed hematocrit is less than 42 and would recommend waiting on phlebotomy.   If symptoms are still persistent despite Ortho intervention return to clinic to discuss phlebotomy. -Otherwise, return to clinic in 6 months with labs and follow-up.

## 2024-09-30 NOTE — Assessment & Plan Note (Addendum)
-  Was prescribed ergocalciferol  50,000 units weekly but unfortunately this caused increased bone pain. -We discussed starting OTC vitamin D  2000 units daily to see if she could tolerate. -Repeat vitamin D  level is 33.74. -Continue OTC vitamin D .

## 2024-10-01 ENCOUNTER — Ambulatory Visit: Admitting: Family Medicine

## 2024-10-01 ENCOUNTER — Encounter: Payer: Self-pay | Admitting: Family Medicine

## 2024-10-01 VITALS — BP 115/79 | HR 75 | Temp 97.8°F | Ht 66.0 in | Wt 120.0 lb

## 2024-10-01 DIAGNOSIS — F321 Major depressive disorder, single episode, moderate: Secondary | ICD-10-CM | POA: Diagnosis not present

## 2024-10-01 DIAGNOSIS — F17210 Nicotine dependence, cigarettes, uncomplicated: Secondary | ICD-10-CM | POA: Diagnosis not present

## 2024-10-01 DIAGNOSIS — Z13220 Encounter for screening for lipoid disorders: Secondary | ICD-10-CM

## 2024-10-01 DIAGNOSIS — F1320 Sedative, hypnotic or anxiolytic dependence, uncomplicated: Secondary | ICD-10-CM

## 2024-10-01 DIAGNOSIS — Z114 Encounter for screening for human immunodeficiency virus [HIV]: Secondary | ICD-10-CM

## 2024-10-01 DIAGNOSIS — Z1159 Encounter for screening for other viral diseases: Secondary | ICD-10-CM

## 2024-10-01 LAB — METHYLMALONIC ACID, SERUM: Methylmalonic Acid, Quantitative: 154 nmol/L (ref 0–378)

## 2024-10-01 NOTE — Progress Notes (Signed)
 New Patient Office Visit  Patient ID: Cheryl Cross, Female   DOB: [birth year] 58 y.o. MRN: [id number]  Chief Complaint  Patient presents with   Establish Care   Subjective:     Cheryl Cross presents to establish care. Oriented to practice routines and expectations. Has been seeing PCP. She does see OBGYN, hematology, orthopedics, ENT, psychiatry, and ophthamology PMH includes polycythemia vera, DDD, anxiety, depression, vertigo, legally blind, PTSD, advanced stargardts plus.  HPI  Discussed the use of AI scribe software for clinical note transcription with the patient, who gave verbal consent to proceed.  History of Present Illness Cheryl Cross is a 58 year old female who presents to establish care  She has a history of polycythemia vera, monitored by her hematologist. Previously, phlebotomies were performed when her hemoglobin was above 12.0, but her current care is based on hematocrit levels. Blood work is now done every five months. She takes vitamin D , B12, and potassium supplements due to low levels. She experiences bone pain associated with polycythemia vera, describing it as a sensation of muscles being pulled off her bones.  She has degenerative disc disease and experiences significant bone pain. She has received a series of injections for pain management, but due to a recent foot surgery, she is spacing out her injections. She takes tramadol  for pain, which she currently receives from her cancer center.  She underwent surgery for Morton's neuroma on her foot on August 24, 2024. Post-surgery, she was prescribed prednisone.  She has vestibular hypofunction, diagnosed after experiencing vertigo. She was started on Claritin  and Flonase , and is awaiting physical therapy. She has not heard back from the physical therapist for about a month.  She has advanced Stargardt's disease, which has significantly impacted her vision, leading to her leaving work and applying  for disability. She has undergone DNA testing confirming the genetic nature of the condition. 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  She experiences anxiety and depression, managed by a psychiatrist. She takes 60 mg of Prozac  daily, increased from 40 mg after her father's passing in February 2025. She also takes clonazepam  as needed, typically once a day.  She has a history of IBS-D, which she manages with diet. No blood in her stool and she has undergone endoscopy and swallowed a camera for evaluation.  She smokes approximately a pack a day for about 30 years. No shortness of breath or wheezing. Not interested in quitting.    Outpatient Encounter Medications as of 10/01/2024  Medication Sig   Acetylcysteine (N-ACETYL CYSTEINE) 600 MG CAPS Take 1 capsule by mouth as needed (for sight).   albuterol  (PROVENTIL ) 2 MG tablet Take 2 mg by mouth in the morning, at noon, in the evening, and at bedtime.   cholecalciferol  (VITAMIN D3) 25 MCG (1000 UNIT) tablet Take 2 tablets (2,000 Units total) by mouth daily.   clonazePAM  (KLONOPIN ) 0.5 MG tablet Take 1 tablet (0.5 mg total) by mouth 4 (four) times daily as needed for anxiety. (Patient taking differently: Take 1 mg by mouth 4 (four) times daily as needed for anxiety.)   diclofenac Sodium (VOLTAREN) 1 % GEL Apply 1 Application topically 4 (four) times daily as needed (pain. (applied to foot area)).   FLUoxetine  (PROZAC ) 20 MG capsule Take 1 capsule (20 mg total) by mouth daily. Add to 40 mg for total of 60 mg daily   FLUoxetine  (PROZAC ) 40 MG capsule Take 1 capsule (40 mg total) by mouth daily.  loratadine  (CLARITIN ) 10 MG tablet Take 10 mg by mouth daily.   meclizine (ANTIVERT) 25 MG tablet Take 25 mg by mouth 4 (four) times daily as needed for dizziness.   potassium chloride  SA (KLOR-CON  M) 20 MEQ tablet Take 1 tablet (20 mEq total) by mouth daily.   traMADol  (ULTRAM ) 50 MG tablet Take 1 tablet (50 mg total) by mouth daily as  needed for severe pain (pain score 7-10).   TRIAMCINOLONE  ACETONIDE,NASAL, NA Place 2 sprays into the nose 1 day or 1 dose.   vitamin B-12 (CYANOCOBALAMIN ) 100 MCG tablet Take 100 mcg by mouth daily. (Patient taking differently: Take 500 mcg by mouth daily.)   [DISCONTINUED] fluticasone  (FLONASE ) 50 MCG/ACT nasal spray SHAKE LIQUID AND USE 2 SPRAYS IN EACH NOSTRIL EVERY DAY (Patient taking differently: Place 2 sprays into both nostrils as needed for allergies.)   [DISCONTINUED] clotrimazole-betamethasone (LOTRISONE) cream Apply topically 2 (two) times daily. (Patient not taking: Reported on 10/01/2024)   [DISCONTINUED] Cyanocobalamin  (VITAMIN B 12) 100 MCG LOZG Take 1 tablet by mouth daily. (Patient not taking: Reported on 10/01/2024)   [DISCONTINUED] methocarbamol  (ROBAXIN ) 500 MG tablet Take 250 mg by mouth every 8 (eight) hours as needed for muscle spasms. (Patient not taking: Reported on 10/01/2024)   [DISCONTINUED] oxyCODONE (OXY IR/ROXICODONE) 5 MG immediate release tablet Take 1 tablet (5 mg total) by mouth every 6 (six) hours as needed for up to 10 days for severe pain (pain score 7-10). (Patient not taking: Reported on 10/01/2024)   [DISCONTINUED] Vitamin D , Ergocalciferol , (DRISDOL) 1.25 MG (50000 UNIT) CAPS capsule Take 50,000 Units by mouth. (Patient not taking: Reported on 10/01/2024)   No facility-administered encounter medications on file as of 10/01/2024.    Past Medical History:  Diagnosis Date   Abdominal pain, right upper quadrant 06/10/2018   Anxiety    Arthritis    Bursitis    Cone-rod dystrophy 2021   DDD (degenerative disc disease)    Depression    Fibromyalgia    Legally blind    Nausea and vomiting 02/26/2023   Obsessive-compulsive disorder    Overactive bladder    Polycythemia vera(238.4) 11/2011   PTSD (post-traumatic stress disorder)    PVC's (premature ventricular contractions)    pt. placed on heart monitor x 24hours, everything fine    Sesamoiditis  12/2015    Past Surgical History:  Procedure Laterality Date   ABDOMINAL HYSTERECTOMY     BIOPSY  01/04/2021   Procedure: BIOPSY;  Surgeon: Cheryl Angelia Sieving, MD;  [facility];  Service: Gastroenterology;;  small bowel biopsy   BIOPSY  03/27/2023   Procedure: BIOPSY;  Surgeon: Cheryl Angelia Sieving, MD;  [facility];  Service: Gastroenterology;;   COLONOSCOPY WITH PROPOFOL  N/A 01/04/2021   Procedure: COLONOSCOPY WITH PROPOFOL ;  Surgeon: Cheryl Angelia Sieving, MD;  [facility];  Service: Gastroenterology;  Laterality: N/A;  1115   ESOPHAGOGASTRODUODENOSCOPY N/A 09/10/2014   Procedure: ESOPHAGOGASTRODUODENOSCOPY (EGD);  Surgeon: Claudis RAYMOND Rivet, MD;  [facility];  Service: Endoscopy;  Laterality: N/A;  130 - moved to 3:15 - Ann to notify   ESOPHAGOGASTRODUODENOSCOPY (EGD) WITH PROPOFOL  N/A 01/04/2021   Procedure: ESOPHAGOGASTRODUODENOSCOPY (EGD) WITH PROPOFOL ;  Surgeon: Cheryl Angelia Sieving, MD;  [facility];  Service: Gastroenterology;  Laterality: N/A;   ESOPHAGOGASTRODUODENOSCOPY (EGD) WITH PROPOFOL  N/A 03/27/2023   Procedure: ESOPHAGOGASTRODUODENOSCOPY (EGD) WITH PROPOFOL ;  Surgeon: Cheryl Angelia Sieving, MD;  [facility];  Service: Gastroenterology;  Laterality: N/A;  9:00am;asa 2  LUMBAR FUSION     POLYPECTOMY  01/04/2021   Procedure: POLYPECTOMY INTESTINAL;  Surgeon: Cheryl Angelia Sieving, MD;  [facility];  Service: Gastroenterology;;  cecal lesion; ascending colon polyp;    TOOTH EXTRACTION Left Aug 2016    Family History  Problem Relation Age of Onset   Cancer - Colon Mother        age 61   Anxiety disorder Mother    Diabetes Mother    Atrial fibrillation Father    Lung cancer Father    Bipolar disorder Sister    Dementia Neg Hx    Drug abuse Neg Hx    Paranoid behavior Neg Hx    Schizophrenia Neg Hx    Seizures Neg Hx    Sexual abuse Neg Hx    Physical abuse Neg  Hx     Social History   Socioeconomic History   Marital status: Widowed    Spouse name: Not on file   Number of children: 0   Years of education: BA   Highest education level: Not on file  Occupational History   Occupation: [employer]- on leave (since 08/2020)  Tobacco Use   Smoking status: Every Day    Current packs/day: 1.00    Average packs/day: 1 pack/day for 27.9 years (27.9 ttl pk-yrs)    Types: Cigarettes    Start date: 11/12/1996    Passive exposure: Current   Smokeless tobacco: Never   Tobacco comments:    1 pack a day x 20 yrs.  Vaping Use   Vaping status: Never Used  Substance and Sexual Activity   Alcohol use: No    Alcohol/week: 0.0 standard drinks of alcohol   Drug use: No    Types: Hydrocodone, Benzodiazepines   Sexual activity: Not Currently  Other Topics Concern   Not on file  Social History Narrative   Right handed   2-3 glasses soda per day   Lives alone   Social Drivers of Health   Financial Resource Strain: Not on file  Food Insecurity: Not on file  Transportation Needs: Not on file  Physical Activity: Not on file  Stress: Not on file  Social Connections: Not on file  Intimate Partner Violence: Not on file    Review of Systems  Constitutional: Negative.   Eyes: Negative.        Legally blind  Psychiatric/Behavioral:  The patient is nervous/anxious.   All other systems reviewed and are negative.     Objective:    BP 115/79   Pulse 75   Temp 97.8 F (36.6 C)   Ht 5' 6 (1.676 m)   Wt 120 lb (54.4 kg)   SpO2 95%   BMI 19.37 kg/m   Physical Exam Vitals and nursing note reviewed.  Constitutional:      Appearance: Normal appearance. She is normal weight.  HENT:     Head: Normocephalic and atraumatic.  Cardiovascular:     Rate and Rhythm: Normal rate and regular rhythm.     Pulses: Normal pulses.     Heart sounds: Normal heart sounds.  Pulmonary:     Effort: Pulmonary effort is normal.     Breath sounds:  Normal breath sounds.  Skin:    General: Skin is warm and dry.  Neurological:     General: No focal deficit present.     Mental Status: She is alert and oriented to person, place, and time. Mental status is at baseline.  Psychiatric:  Mood and Affect: Mood normal.        Behavior: Behavior normal.        Thought Content: Thought content normal.        Judgment: Judgment normal.      Last CBC Lab Results  Component Value Date   WBC 7.0 09/23/2024   HGB 13.4 09/23/2024   HCT 38.3 09/23/2024   MCV 90.3 09/23/2024   MCH 31.6 09/23/2024   RDW 12.2 09/23/2024   PLT 290 09/23/2024   Last metabolic panel Lab Results  Component Value Date   GLUCOSE 72 09/23/2024   NA 137 09/23/2024   K 3.4 (L) 09/23/2024   CL 99 09/23/2024   CO2 28 09/23/2024   BUN <5 (L) 09/23/2024   CREATININE 0.88 09/23/2024   GFRNONAA >60 09/23/2024   CALCIUM 8.8 (L) 09/23/2024   PROT 6.8 09/23/2024   ALBUMIN 4.4 09/23/2024   LABGLOB 2.9 10/13/2020   AGRATIO 2.0 06/04/2018   BILITOT 0.4 09/23/2024   ALKPHOS 78 09/23/2024   AST 20 09/23/2024   ALT 9 09/23/2024   ANIONGAP 11 09/23/2024   Last lipids No results found for: CHOL, HDL, LDLCALC, LDLDIRECT, TRIG, CHOLHDL Last hemoglobin A1c No results found for: HGBA1C Last thyroid  functions Lab Results  Component Value Date   TSH 2.387 07/04/2023   FREET4 1.06 05/28/2016   Last vitamin D  Lab Results  Component Value Date   VD25OH 33.74 09/23/2024   Last vitamin B12 and Folate Lab Results  Component Value Date   VITAMINB12 696 09/23/2024   FOLATE >20.0 06/13/2010        Assessment & Plan:   Problem List Items Addressed This Visit     Benzodiazepine dependence (HCC) (Chronic)   Major depressive disorder, single episode, moderate (HCC)   Other Visit Diagnoses       Screening, lipid    -  Primary   Relevant Orders   Lipid panel     Screening for HIV (human immunodeficiency virus)       Relevant Orders   HIV  Antibody (routine testing w rflx)     Need for hepatitis C screening test       Relevant Orders   Hepatitis C antibody     Cigarette nicotine  dependence without complication       Relevant Orders   CT CHEST LUNG CA SCREEN LOW DOSE W/O CM       Assessment and Plan Assessment & Plan Adult Wellness Visit Routine wellness visit focused on preventive care and screenings. - Ordered cholesterol, HIV, and hepatitis C screenings. - Ordered lung CT scan for lung cancer screening. - Will schedule mammogram in May. - Will release records from gynecologist and cancer center for lab work and vaccines.  Polycythemia vera Chronic condition managed by hematologist with bone pain possibly related to polycythemia vera. - Continue management with hematologist. - Continue to monitor blood counts and metabolic panel.  Degenerative disc disease Chronic condition managed by orthopedist with bone pain managed with tramadol  and injections. - Continue management with orthopedist. - Continue tramadol  for bone pain. - Will schedule next injection in January.  Stargardt disease with vision loss Chronic genetic condition causing significant vision loss, managed by ophthalmologist. - Continue management with ophthalmologist.  Vestibular hypofunction Diagnosed by ENT with ongoing management including Claritin  and Flonase . - Follow up with ENT regarding physical therapy referral.  Irritable bowel syndrome with diarrhea (IBS-D) Chronic condition managed with dietary modifications. - Continue dietary management.  Nicotine  dependence, cigarettes Long-term  smoker with no current desire to quit. Discussed lung cancer risk and screening. - Offered support for smoking cessation if desired. - Ordered lung CT scan for lung cancer screening.  Depression Chronic condition managed by psychiatrist with Prozac  and Klonopin  as needed. - Continue management with psychiatrist. - Continue Prozac  and Klonopin  as  needed.  Sedative, hypnotic or anxiolytic use, uncomplicated Klonopin  used as needed for anxiety, typically once a day. - Continue Klonopin  as needed.    Return in about 1 year (around 10/01/2025) for annual physical with labs 1 week prior.   Jeoffrey GORMAN Barrio, FNP [facility]

## 2024-10-01 NOTE — Patient Instructions (Signed)
 It was great to meet you today and I'm excited to have you join the [facility] practice. I hope you had a positive experience today! If you feel so inclined, please feel free to recommend our practice to friends and family. Jeoffrey Barrio, FNP-C

## 2024-10-02 LAB — LIPID PANEL
Cholesterol: 187 mg/dL (ref <200)
HDL: 40 mg/dL — ABNORMAL LOW (ref >=50)
LDL Cholesterol (Calc): 122 mg/dL — ABNORMAL HIGH
Non-HDL Cholesterol (Calc): 147 mg/dL — ABNORMAL HIGH (ref <130)
Total CHOL/HDL Ratio: 4.7 (calc) (ref <5.0)
Triglycerides: 131 mg/dL (ref <150)

## 2024-10-02 LAB — HIV ANTIBODY (ROUTINE TESTING W REFLEX)
HIV 1&2 Ab, 4th Generation: NONREACTIVE
HIV FINAL INTERPRETATION: NEGATIVE

## 2024-10-02 LAB — HEPATITIS C ANTIBODY: Hepatitis C Ab: NONREACTIVE

## 2024-10-05 ENCOUNTER — Ambulatory Visit: Payer: Self-pay | Admitting: Family Medicine

## 2024-10-06 ENCOUNTER — Telehealth: Payer: Self-pay

## 2024-10-06 NOTE — Telephone Encounter (Signed)
 Copied from CRM #[id number]. Topic: Clinical - Lab/Test Results >> Oct 06, 2024  9:54 AM Gustabo D wrote: Py would like a call bacj from a nurse about results she has questions and she is blind

## 2024-11-09 ENCOUNTER — Encounter: Payer: Self-pay | Admitting: *Deleted

## 2024-11-09 ENCOUNTER — Ambulatory Visit: Admitting: Family Medicine

## 2024-11-09 ENCOUNTER — Ambulatory Visit: Payer: Self-pay

## 2024-11-09 ENCOUNTER — Encounter: Payer: Self-pay | Admitting: Family Medicine

## 2024-11-09 VITALS — BP 116/72 | HR 79 | Temp 97.8°F | Ht 66.0 in | Wt 114.0 lb

## 2024-11-09 DIAGNOSIS — J069 Acute upper respiratory infection, unspecified: Secondary | ICD-10-CM | POA: Diagnosis not present

## 2024-11-09 DIAGNOSIS — J029 Acute pharyngitis, unspecified: Secondary | ICD-10-CM | POA: Diagnosis not present

## 2024-11-09 NOTE — Progress Notes (Signed)
 "  Acute Office Visit  Patient ID: Cheryl Cross, female    DOB: [birth year], 58 y.o.   MRN: [id number]  PCP: Kayla Jeoffrey RAMAN, FNP  Chief Complaint  Patient presents with   Acute Visit    Sore throat, ear aches, vertigo, low grade fever, diarrhea when she eats, fatigue x 5 days      Subjective:     HPI  Discussed the use of AI scribe software for clinical note transcription with the patient, who gave verbal consent to proceed.  History of Present Illness Cheryl Cross is a 58 year old female who presents with sore throat, earache, and diarrhea.  She has been experiencing a sore throat and earache with a sensation of fullness in her ears for the past five days. 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 No fever, cough, runny nose, or nasal congestion, but there is sinus pressure and discomfort in her teeth and gums, particularly on the right side.  She describes significant fatigue, particularly after eating, and notes that eating often leads to diarrhea. No fever, but she has felt very tired since Christmas Day, with symptoms worsening the following morning. Nausea is associated with lying down and postnasal drip, and the diarrhea is painful. No blood in her stool. Her symptoms have persisted without significant improvement over the past few days.  She was in contact with someone who works at a daycare and was sick over the holiday, but this person tested negative for the flu. She has not undergone flu or COVID testing herself.  She has been taking Claritin , Nasonex, and Robitussin DM to manage her symptoms.   Review of Systems  All other systems reviewed and are negative.   Past Medical History:  Diagnosis Date   Abdominal pain, right upper quadrant 06/10/2018   Anxiety    Arthritis    Bursitis    Cone-rod dystrophy 2021   DDD (degenerative disc disease)    Depression    Fibromyalgia     Legally blind    Nausea and vomiting 02/26/2023   Obsessive-compulsive disorder    Overactive bladder    Polycythemia vera(238.4) 11/2011   PTSD (post-traumatic stress disorder)    PVC's (premature ventricular contractions)    pt. placed on heart monitor x 24hours, everything fine    Sesamoiditis 12/2015    Past Surgical History:  Procedure Laterality Date   ABDOMINAL HYSTERECTOMY     BIOPSY  01/04/2021   Procedure: BIOPSY;  Surgeon: Eartha Angelia Sieving, MD;  [facility];  Service: Gastroenterology;;  small bowel biopsy   BIOPSY  03/27/2023   Procedure: BIOPSY;  Surgeon: Eartha Angelia Sieving, MD;  [facility];  Service: Gastroenterology;;   COLONOSCOPY WITH PROPOFOL  N/A 01/04/2021   Procedure: COLONOSCOPY WITH PROPOFOL ;  Surgeon: Eartha Angelia Sieving, MD;  [facility];  Service: Gastroenterology;  Laterality: N/A;  1115   ESOPHAGOGASTRODUODENOSCOPY N/A 09/10/2014   Procedure: ESOPHAGOGASTRODUODENOSCOPY (EGD);  Surgeon: Claudis RAYMOND Rivet, MD;  [facility];  Service: Endoscopy;  Laterality: N/A;  130 - moved to 3:15 - Ann to notify   ESOPHAGOGASTRODUODENOSCOPY (EGD) WITH PROPOFOL  N/A 01/04/2021   Procedure: ESOPHAGOGASTRODUODENOSCOPY (EGD) WITH PROPOFOL ;  Surgeon: Eartha Angelia Sieving, MD;  [facility];  Service: Gastroenterology;  Laterality: N/A;   ESOPHAGOGASTRODUODENOSCOPY (EGD) WITH PROPOFOL  N/A 03/27/2023   Procedure: ESOPHAGOGASTRODUODENOSCOPY (EGD) WITH PROPOFOL ;  Surgeon: Eartha Angelia Sieving, MD;  [facility];  Service: Gastroenterology;  Laterality: N/A;  9:00am;asa 2   LUMBAR FUSION     POLYPECTOMY  01/04/2021   Procedure: POLYPECTOMY INTESTINAL;  Surgeon: Eartha Flavors, Toribio, MD;  [facility];  Service: Gastroenterology;;  cecal lesion; ascending colon polyp;    TOOTH EXTRACTION Left Aug 2016    Outpatient Medications Prior to Visit  Medication Sig Dispense Refill    Acetylcysteine (N-ACETYL CYSTEINE) 600 MG CAPS Take 1 capsule by mouth as needed (for sight).     albuterol  (PROVENTIL ) 2 MG tablet Take 2 mg by mouth in the morning, at noon, in the evening, and at bedtime. (Patient taking differently: Take 2 mg by mouth 4 (four) times daily as needed for wheezing.)     cholecalciferol  (VITAMIN D3) 25 MCG (1000 UNIT) tablet Take 2 tablets (2,000 Units total) by mouth daily. 120 tablet 2   clonazePAM  (KLONOPIN ) 0.5 MG tablet Take 1 tablet (0.5 mg total) by mouth 4 (four) times daily as needed for anxiety. 120 tablet 2   diclofenac Sodium (VOLTAREN) 1 % GEL Apply 1 Application topically 4 (four) times daily as needed (pain. (applied to foot area)).     FLUoxetine  (PROZAC ) 40 MG capsule Take 1 capsule (40 mg total) by mouth daily. 30 capsule 2   lidocaine  (LIDODERM ) 5 % Place 1 patch onto the skin daily.     loratadine  (CLARITIN ) 10 MG tablet Take 10 mg by mouth daily.     meclizine (ANTIVERT) 25 MG tablet Take 25 mg by mouth 4 (four) times daily as needed for dizziness.     methocarbamol  (ROBAXIN ) 500 MG tablet Take 500 mg by mouth every 6 (six) hours as needed for muscle spasms.     potassium chloride  SA (KLOR-CON  M) 20 MEQ tablet Take 1 tablet (20 mEq total) by mouth daily. 90 tablet 0   traMADol  (ULTRAM ) 50 MG tablet Take 1 tablet (50 mg total) by mouth daily as needed for severe pain (pain score 7-10). 60 tablet 0   TRIAMCINOLONE  ACETONIDE,NASAL, NA Place 2 sprays into the nose 1 day or 1 dose.     vitamin B-12 (CYANOCOBALAMIN ) 100 MCG tablet Take 100 mcg by mouth daily. (Patient taking differently: Take 500 mcg by mouth daily.)     FLUoxetine  (PROZAC ) 20 MG capsule Take 1 capsule (20 mg total) by mouth daily. Add to 40 mg for total of 60 mg daily (Patient not taking: Reported on 11/09/2024) 30 capsule 2   No facility-administered medications prior to visit.    Allergies[1]     Objective:    BP 116/72   Pulse 79   Temp 97.8 F (36.6 C)   Ht 5' 6  (1.676 m)   Wt 114 lb (51.7 kg)   SpO2 97%   BMI 18.40 kg/m  BP Readings from Last 3 Encounters:  11/09/24 116/72  10/01/24 115/79  06/24/24 111/69   Wt Readings from Last 3 Encounters:  11/09/24 114 lb (51.7 kg)  10/01/24 120 lb (54.4 kg)  06/24/24 118 lb 2.7 oz (53.6 kg)      Physical Exam Vitals and nursing note reviewed.  Constitutional:      Appearance: Normal appearance. She is normal weight.  HENT:     Head: Normocephalic and atraumatic.     Right Ear: Tympanic membrane, ear canal and external ear normal.     Left Ear: Tympanic membrane, ear canal and external ear normal.     Nose: Nose normal.     Mouth/Throat:  Mouth: Mucous membranes are moist.     Pharynx: Oropharynx is clear.  Eyes:     Conjunctiva/sclera: Conjunctivae normal.     Pupils: Pupils are equal, round, and reactive to light.  Cardiovascular:     Rate and Rhythm: Normal rate and regular rhythm.     Pulses: Normal pulses.     Heart sounds: Normal heart sounds.  Pulmonary:     Effort: Pulmonary effort is normal.     Breath sounds: Normal breath sounds.  Abdominal:     General: Bowel sounds are normal. There is no distension.     Palpations: Abdomen is soft.     Tenderness: There is no abdominal tenderness.  Musculoskeletal:     Cervical back: No tenderness.  Lymphadenopathy:     Cervical: No cervical adenopathy.  Skin:    General: Skin is warm and dry.  Neurological:     General: No focal deficit present.     Mental Status: She is alert and oriented to person, place, and time. Mental status is at baseline.  Psychiatric:        Mood and Affect: Mood normal.        Behavior: Behavior normal.        Thought Content: Thought content normal.        Judgment: Judgment normal.       Results for orders placed or performed in visit on 11/09/24  STREP GROUP A AG, W/REFLEX TO CULT   Specimen: Throat  Result Value Ref Range   Streptococcus Group A AG NOT DETECTED NOT DETECTED        Assessment & Plan:   Problem List Items Addressed This Visit       Respiratory   Viral URI - Primary   Other Visit Diagnoses       Sore throat       Relevant Orders   STREP GROUP A AG, W/REFLEX TO CULT (Completed)       Assessment and Plan Assessment & Plan Acute viral upper respiratory infection with diarrhea Symptoms suggest viral etiology. Diarrhea likely causing fatigue and electrolyte imbalance. - Swabbed throat for strep test. - Encouraged increased intake of electrolyte fluids to prevent dehydration and maintain potassium levels.    No orders of the defined types were placed in this encounter.   Return if symptoms worsen or fail to improve.  Jeoffrey GORMAN Barrio, FNP [facility]      [1]  Allergies Allergen Reactions   Gabapentin  Hives   Hydrocodone-Acetaminophen  Nausea Only   Penicillins Other (See Comments)    Unknown child hood reaction  .Did it involve swelling of the face/tongue/throat, SOB, or low BP? Unknown  Did it involve sudden or severe rash/hives, skin peeling, or any reaction on the inside of your mouth or nose? Unknown  Did you need to seek medical attention at a hospital or doctor's office? Unknown  When did it last happen?        If all above answers are NO, may proceed with cephalosporin use.  Product containing penicillin (product)  Not sure - allergy when a child    Unknown child hood reaction .Did it involve swelling of the face/tongue/throat, SOB, or low BP? Unknown Did it involve sudden or severe rash/hives, skin peeling, or any reaction on the inside of your mouth or nose? Unknown Did you need to seek medical attention at a hospital or doctor's office? Unknown When did it last happen?  If all above answers are NO, may proceed with cephalosporin use.    penicillin V   "

## 2024-11-09 NOTE — Telephone Encounter (Signed)
 FYI Only or Action Required?: Action required by provider: request for appointment.  Patient was last seen in primary care on 10/01/2024 by Kayla Jeoffrey RAMAN, FNP.  Called Nurse Triage reporting Sore Throat.  Symptoms began several days ago.  Interventions attempted: Rest, hydration, or home remedies.  Symptoms are: unchanged.Sore throat, ear pain, diarrhea.  Triage Disposition: See Physician Within 24 Hours  Patient/caregiver understands and will follow disposition?: Yes      Copied from CRM #[id number]. Topic: Clinical - Red Word Triage >> Nov 09, 2024  7:54 AM Ivette P wrote: Red Word that prompted transfer to Nurse Triage:  soar throat, ear ache, diarrhea, virtigo (dizziness), fatigue- dont feel like doing anything. Answer Assessment - Initial Assessment Questions 1. ONSET: When did the throat start hurting? (Hours or days ago)      5 days 2. SEVERITY: How bad is the sore throat? (Scale 1-10; mild, moderate or severe)     mild 3. STREP EXPOSURE: Has there been any exposure to strep within the past week? If Yes, ask: What type of contact occurred?      no 4.  VIRAL SYMPTOMS: Are there any symptoms of a cold, such as a runny nose, cough, hoarse voice or red eyes?      Ear pain, diarrhea 5. FEVER: Do you have a fever? If Yes, ask: What is your temperature, how was it measured, and when did it start?     no 6. PUS ON THE TONSILS: Is there pus on the tonsils in the back of your throat?     no 7. OTHER SYMPTOMS: Do you have any other symptoms? (e.g., difficulty breathing, headache, rash)     no 8. PREGNANCY: Is there any chance you are pregnant? When was your last menstrual period?     no  Protocols used: Sore Throat-A-AH

## 2024-11-11 LAB — CULTURE, GROUP A STREP
Micro Number: 17404232
SPECIMEN QUALITY:: ADEQUATE

## 2024-11-11 LAB — STREP GROUP A AG, W/REFLEX TO CULT: Streptococcus Group A AG: NOT DETECTED

## 2024-11-23 ENCOUNTER — Encounter: Payer: Self-pay | Admitting: Family Medicine

## 2024-11-23 ENCOUNTER — Ambulatory Visit: Admitting: Family Medicine

## 2024-11-23 VITALS — BP 117/68 | HR 74 | Temp 98.0°F | Ht 66.0 in | Wt 118.4 lb

## 2024-11-23 DIAGNOSIS — Z0000 Encounter for general adult medical examination without abnormal findings: Secondary | ICD-10-CM

## 2024-11-23 NOTE — Progress Notes (Signed)
 "  Chief Complaint  Patient presents with   Annual Exam    Medicare Annual wellness visit      Subjective:   Cheryl Cross is a 59 y.o. female who presents for a Welcome to Medicare Exam.   Visit info / Clinical Intake: Medicare Wellness Visit Type:: Initial Annual Wellness Visit Persons participating in visit and providing information:: patient Medicare Wellness Visit Mode:: In-person (required for WTM) Interpreter Needed?: No Pre-visit prep was completed: no AWV questionnaire completed by patient prior to visit?: no Living arrangements:: (!) lives alone Patient's Overall Health Status Rating: good Typical amount of pain: some (mostly in back, legs and feet due to degenerative disc disease.) Does pain affect daily life?: (!) yes (causes mobility issues) Are you currently prescribed opioids?: no  Dietary Habits and Nutritional Risks How many meals a day?: 2 Eats fruit and vegetables daily?: yes Most meals are obtained by: eating out In the last 2 weeks, have you had any of the following?: none Diabetic:: no  Functional Status Activities of Daily Living (to include ambulation/medication): (!) Needs Assist (due to vision impairment) Feeding: Needs assistance (needs some assistance w/ cooking) Dressing/Grooming: Independent Bathing: Independent Toileting: Independent Transfer: Needs assistance Ambulation: Needs Assistance Medication Administration: Needs assistance (comment) Is this a change from baseline?: Change from baseline, expected to last >3 days Home Management (perform basic housework or laundry): Dependent Manage your own finances?: (!) no (needs assistance to write checks and pay the bills due to vision impairement) Concerns about vision?: (!) yes (advanced stargardts plus) Concerns about hearing?: (!) yes (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 needs hearing aids.) Uses hearing aids?: no (would like evaluaiton for hearing  aids.) Hear whispered voice?: (!) no *in-person visit only*  Fall Screening Falls in the past year?: 0 Number of falls in past year: 0 Was there an injury with Fall?: 0 Fall Risk Category Calculator: 0 Patient Fall Risk Level: Low Fall Risk  Fall Risk Patient at Risk for Falls Due to: No Fall Risks Fall risk Follow up: Falls prevention discussed  Home and Transportation Safety: All rugs have non-skid backing?: (!) no All stairs or steps have railings?: yes (one step coming to home that has a pole for stability) Grab bars in the bathtub or shower?: (!) no Have non-skid surface in bathtub or shower?: yes Regular seat belt use?: yes Hospital stays in the last year:: no  Cognitive Assessment Difficulty concentrating, remembering, or making decisions? : no Will 6CIT or Mini Cog be Completed: no 6CIT or Mini Cog Declined: patient declined  Advance Directives (For Healthcare) Does Patient Have a Medical Advance Directive?: No Would patient like information on creating a medical advance directive?: No - Patient declined  Reviewed/Updated  Reviewed/Updated: Reviewed All (Medical, Surgical, Family, Medications, Allergies, Care Teams, Patient Goals)    Allergies (verified) Gabapentin , Hydrocodone-acetaminophen , and Penicillins   Current Medications (verified) Outpatient Encounter Medications as of 11/23/2024  Medication Sig   albuterol  (PROVENTIL ) 2 MG tablet Take 2 mg by mouth in the morning, at noon, in the evening, and at bedtime. (Patient taking differently: Take 2 mg by mouth 4 (four) times daily as needed for wheezing.)   cholecalciferol  (VITAMIN D3) 25 MCG (1000 UNIT) tablet Take 2 tablets (2,000 Units total) by mouth daily.   clonazePAM  (KLONOPIN ) 0.5 MG tablet Take 1 tablet (0.5 mg total) by mouth 4 (four) times daily as needed for anxiety.   diclofenac Sodium (VOLTAREN) 1 % GEL Apply 1 Application  topically 4 (four) times daily as needed (pain. (applied to foot area)).    FLUoxetine  (PROZAC ) 20 MG capsule Take 1 capsule (20 mg total) by mouth daily. Add to 40 mg for total of 60 mg daily   FLUoxetine  (PROZAC ) 40 MG capsule Take 1 capsule (40 mg total) by mouth daily.   lidocaine  (LIDODERM ) 5 % Place 1 patch onto the skin daily.   loratadine  (CLARITIN ) 10 MG tablet Take 10 mg by mouth daily. (Patient taking differently: Take 10 mg by mouth as needed.)   meclizine (ANTIVERT) 25 MG tablet Take 25 mg by mouth 4 (four) times daily as needed for dizziness.   methocarbamol  (ROBAXIN ) 500 MG tablet Take 500 mg by mouth every 6 (six) hours as needed for muscle spasms.   potassium chloride  SA (KLOR-CON  M) 20 MEQ tablet Take 1 tablet (20 mEq total) by mouth daily.   traMADol  (ULTRAM ) 50 MG tablet Take 1 tablet (50 mg total) by mouth daily as needed for severe pain (pain score 7-10).   TRIAMCINOLONE  ACETONIDE,NASAL, NA Place 2 sprays into the nose 1 day or 1 dose.   vitamin B-12 (CYANOCOBALAMIN ) 100 MCG tablet Take 100 mcg by mouth daily. (Patient taking differently: Take 500 mcg by mouth daily.)   Acetylcysteine (N-ACETYL CYSTEINE) 600 MG CAPS Take 1 capsule by mouth as needed (for sight). (Patient not taking: Reported on 11/23/2024)   No facility-administered encounter medications on file as of 11/23/2024.    History: Past Medical History:  Diagnosis Date   Abdominal pain, right upper quadrant 06/10/2018   Anxiety    Arthritis    Bursitis    Cone-rod dystrophy 2021   DDD (degenerative disc disease)    Depression    Fibromyalgia    Legally blind    Nausea and vomiting 02/26/2023   Obsessive-compulsive disorder    Overactive bladder    Polycythemia vera(238.4) 11/2011   PTSD (post-traumatic stress disorder)    PVC's (premature ventricular contractions)    pt. placed on heart monitor x 24hours, everything fine    Sesamoiditis 12/2015   Past Surgical History:  Procedure Laterality Date   ABDOMINAL HYSTERECTOMY     BIOPSY  01/04/2021   Procedure: BIOPSY;  Surgeon:  Eartha Angelia Sieving, MD;  [facility]: [facility];  Service: Gastroenterology;;  small bowel biopsy   BIOPSY  03/27/2023   Procedure: BIOPSY;  Surgeon: Eartha Angelia Sieving, MD;  Location: [facility];  Service: Gastroenterology;;   COLONOSCOPY WITH PROPOFOL  N/A 01/04/2021   Procedure: COLONOSCOPY WITH PROPOFOL ;  Surgeon: Eartha Angelia Sieving, MD;  Location: [facility];  Service: Gastroenterology;  Laterality: N/A;  1115   ESOPHAGOGASTRODUODENOSCOPY N/A 09/10/2014   Procedure: ESOPHAGOGASTRODUODENOSCOPY (EGD);  Surgeon: Claudis RAYMOND Rivet, MD;  Location: [facility];  Service: Endoscopy;  Laterality: N/A;  130 - moved to 3:15 - Ann to notify   ESOPHAGOGASTRODUODENOSCOPY (EGD) WITH PROPOFOL  N/A 01/04/2021   Procedure: ESOPHAGOGASTRODUODENOSCOPY (EGD) WITH PROPOFOL ;  Surgeon: Eartha Angelia Sieving, MD;  Location: [facility];  Service: Gastroenterology;  Laterality: N/A;   ESOPHAGOGASTRODUODENOSCOPY (EGD) WITH PROPOFOL  N/A 03/27/2023   Procedure: ESOPHAGOGASTRODUODENOSCOPY (EGD) WITH PROPOFOL ;  Surgeon: Eartha Angelia Sieving, MD;  Location: [facility];  Service: Gastroenterology;  Laterality: N/A;  9:00am;asa 2   LUMBAR FUSION     POLYPECTOMY  01/04/2021   Procedure: POLYPECTOMY INTESTINAL;  Surgeon: Eartha Angelia Sieving, MD;  Location: [facility];  Service: Gastroenterology;;  cecal lesion; ascending colon polyp;    TOOTH EXTRACTION Left Aug 2016  Family History  Problem Relation Age of Onset   Cancer - Colon Mother        age 98   Anxiety disorder Mother    Diabetes Mother    Atrial fibrillation Father    Lung cancer Father    Bipolar disorder Sister    Dementia Neg Hx    Drug abuse Neg Hx    Paranoid behavior Neg Hx    Schizophrenia Neg Hx    Seizures Neg Hx    Sexual abuse Neg Hx    Physical abuse Neg Hx    Social History   Occupational History   Occupation: [employer]- on leave (since 08/2020)  Tobacco Use   Smoking  status: Every Day    Current packs/day: 1.00    Average packs/day: 1 pack/day for 28.0 years (28.0 ttl pk-yrs)    Types: Cigarettes    Start date: 11/12/1996    Passive exposure: Current   Smokeless tobacco: Never   Tobacco comments:    1 pack a day x 20 yrs.  Vaping Use   Vaping status: Never Used  Substance and Sexual Activity   Alcohol use: No    Alcohol/week: 0.0 standard drinks of alcohol   Drug use: No    Types: Hydrocodone, Benzodiazepines   Sexual activity: Not Currently   Tobacco Counseling Ready to quit: Yes Counseling given: Yes Tobacco comments: 1 pack a day x 20 yrs.  SDOH Screenings   Food Insecurity: No Food Insecurity (11/23/2024)  Housing: Low Risk (11/23/2024)  Transportation Needs: Unmet Transportation Needs (11/23/2024)  Utilities: Not At Risk (11/23/2024)  Depression (PHQ2-9): High Risk (11/23/2024)  Physical Activity: Inactive (11/23/2024)  Social Connections: Socially Isolated (11/23/2024)  Stress: No Stress Concern Present (11/23/2024)  Tobacco Use: High Risk (11/23/2024)  Health Literacy: Inadequate Health Literacy (11/23/2024)   See flowsheets for full screening details  Depression Screen PHQ 2 & 9 Depression Scale- Over the past 2 weeks, how often have you been bothered by any of the following problems? Little interest or pleasure in doing things: 3 Feeling down, depressed, or hopeless (PHQ Adolescent also includes...irritable): 2 PHQ-2 Total Score: 5 Trouble falling or staying asleep, or sleeping too much: 0 Feeling tired or having little energy: 3 Poor appetite or overeating (PHQ Adolescent also includes...weight loss): 3 (poor appitite) Feeling bad about yourself - or that you are a failure or have let yourself or your family down: 0 Trouble concentrating on things, such as reading the newspaper or watching television (PHQ Adolescent also includes...like school work): 1 Moving or speaking so slowly that other people could have noticed. Or the opposite  - being so fidgety or restless that you have been moving around a lot more than usual: 0 Thoughts that you would be better off dead, or of hurting yourself in some way: 0 PHQ-9 Total Score: 12 If you checked off any problems, how difficult have these problems made it for you to do your work, take care of things at home, or get along with other people?: Somewhat difficult (sees psychiatry)  Depression Treatment Depression Interventions/Treatment : Currently on Treatment; Medication      Goals Addressed               This Visit's Progress     Patient Stated (pt-stated)        Working on getting acclimated with blindness . Looking for grants or community resources to assist with her condition .  Objective:    Today's Vitals   11/23/24 1142  BP: 117/68  Pulse: 74  Temp: 98 F (36.7 C)  SpO2: 98%  Weight: 118 lb 6.4 oz (53.7 kg)  Height: 5' 6 (1.676 m)  PainSc: 0-No pain   Body mass index is 19.11 kg/m.   Physical Exam Vitals and nursing note reviewed.  Constitutional:      Appearance: Normal appearance. She is normal weight.  HENT:     Head: Normocephalic and atraumatic.  Skin:    General: Skin is warm and dry.  Neurological:     General: No focal deficit present.     Mental Status: She is alert and oriented to person, place, and time. Mental status is at baseline.  Psychiatric:        Mood and Affect: Mood normal.        Behavior: Behavior normal.        Thought Content: Thought content normal.        Judgment: Judgment normal.       Hearing/Vision screen No results found. Immunizations and Health Maintenance Health Maintenance  Topic Date Due   COVID-19 Vaccine (4 - 2025-26 season) 11/25/2024 (Originally 07/13/2024)   Influenza Vaccine  12/12/2024 (Originally 06/12/2024)   Zoster Vaccines- Shingrix (1 of 2) 01/01/2025 (Originally 08/31/1986)   Lung Cancer Screening  10/01/2025 (Originally 08/31/2017)   DTaP/Tdap/Td (1 - Tdap) 10/01/2025  (Originally 08/31/1986)   Pneumococcal Vaccine: 50+ Years (1 of 2 - PCV) 10/01/2025 (Originally 08/31/1986)   Hepatitis B Vaccines 19-59 Average Risk (1 of 3 - 19+ 3-dose series) 10/01/2025 (Originally 08/31/1986)   Medicare Annual Wellness (AWV)  11/23/2025   Mammogram  03/16/2026   Colonoscopy  01/05/2028   Hepatitis C Screening  Completed   HIV Screening  Completed   HPV VACCINES  Aged Out   Meningococcal B Vaccine  Aged Out    EKG: normal EKG, normal sinus rhythm     Assessment/Plan:  This is a routine wellness examination for Cheryl Cross.  Patient Care Team: Kayla Jeoffrey RAMAN, FNP as PCP - General (Family Medicine) Okey Barnie SAUNDERS, MD as Consulting Physician [facility]) Arnaldo Juliene RAMAN, MD as Attending Physician (Orthopedic Surgery)  I have personally reviewed and noted the following in the patients chart:   Medical and social history Use of alcohol, tobacco or illicit drugs  Current medications and supplements including opioid prescriptions. Functional ability and status Nutritional status Physical activity Advanced directives List of other physicians Hospitalizations, surgeries, and ER visits in previous 12 months Vitals Screenings to include cognitive, depression, and falls Referrals and appointments  No orders of the defined types were placed in this encounter.  In addition, I have reviewed and discussed with patient certain preventive protocols, quality metrics, and best practice recommendations. A written personalized care plan for preventive services as well as general preventive health recommendations were provided to patient.   Jeoffrey RAMAN Kayla, FNP   11/23/2024   Return in 1 year (on 11/23/2025).  "

## 2024-11-23 NOTE — Progress Notes (Signed)
 [facility] for vision exams

## 2024-11-23 NOTE — Patient Instructions (Signed)
 Cheryl Cross,  Thank you for taking the time for your Medicare Wellness Visit. I appreciate your continued commitment to your health goals. Please review the care plan we discussed, and feel free to reach out if I can assist you further.  Please note that Annual Wellness Visits do not include a physical exam. Some assessments may be limited, especially if the visit was conducted virtually. If needed, we may recommend an in-person follow-up with your provider.  Ongoing Care Seeing your primary care provider every 3 to 6 months helps us  monitor your health and provide consistent, personalized care.   Referrals If a referral was made during today's visit and you haven't received any updates within two weeks, please contact the referred provider directly to check on the status.  Recommended Screenings:  Health Maintenance  Topic Date Due   COVID-19 Vaccine (4 - 2025-26 season) 11/25/2024*   Flu Shot  12/12/2024*   Zoster (Shingles) Vaccine (1 of 2) 01/01/2025*   Screening for Lung Cancer  10/01/2025*   DTaP/Tdap/Td vaccine (1 - Tdap) 10/01/2025*   Pneumococcal Vaccine for age over 9 (1 of 2 - PCV) 10/01/2025*   Hepatitis B Vaccine (1 of 3 - 19+ 3-dose series) 10/01/2025*   Medicare Annual Wellness Visit  11/23/2025   Breast Cancer Screening  03/16/2026   Colon Cancer Screening  01/05/2028   Hepatitis C Screening  Completed   HIV Screening  Completed   HPV Vaccine  Aged Out   Meningitis B Vaccine  Aged Out  *Topic was postponed. The date shown is not the original due date.       11/23/2024   12:03 PM  Advanced Directives  Does Patient Have a Medical Advance Directive? No  Would patient like information on creating a medical advance directive? No - Patient declined    Vision: Annual vision screenings are recommended for early detection of glaucoma, cataracts, and diabetic retinopathy. These exams can also reveal signs of chronic conditions such as diabetes and high blood  pressure.  Dental: Annual dental screenings help detect early signs of oral cancer, gum disease, and other conditions linked to overall health, including heart disease and diabetes.  Please see the attached documents for additional preventive care recommendations.

## 2024-11-27 ENCOUNTER — Encounter: Payer: Self-pay | Admitting: Family Medicine

## 2024-12-04 ENCOUNTER — Telehealth (INDEPENDENT_AMBULATORY_CARE_PROVIDER_SITE_OTHER): Admitting: Psychiatry

## 2024-12-04 ENCOUNTER — Encounter (HOSPITAL_COMMUNITY): Payer: Self-pay | Admitting: Psychiatry

## 2024-12-04 DIAGNOSIS — F321 Major depressive disorder, single episode, moderate: Secondary | ICD-10-CM

## 2024-12-04 MED ORDER — CLONAZEPAM 0.5 MG PO TABS: 0.5000 mg | ORAL_TABLET | Freq: Four times a day (QID) | ORAL | 2 refills | Status: AC | PRN

## 2024-12-04 MED ORDER — FLUOXETINE HCL 40 MG PO CAPS: 40.0000 mg | ORAL_CAPSULE | Freq: Every day | ORAL | 2 refills | Status: AC

## 2024-12-04 NOTE — Progress Notes (Signed)
 Virtual Visit via Video Note  I connected with Cheryl Cross on 12/04/24 at 11:00 AM EST by a video enabled telemedicine application and verified that I am speaking with the correct person using two identifiers.  Location: Patient: home Provider: office   I discussed the limitations of evaluation and management by telemedicine and the availability of in person appointments. The patient expressed understanding and agreed to proceed.     I discussed the assessment and treatment plan with the patient. The patient was provided an opportunity to ask questions and all were answered. The patient agreed with the plan and demonstrated an understanding of the instructions.   The patient was advised to call back or seek an in-person evaluation if the symptoms worsen or if the condition fails to improve as anticipated.  I provided 20 minutes of non-face-to-face time during this encounter.   Cheryl Gull, MD  [facility] MD/PA/NP OP Progress Note  12/04/2024 11:21 AM Cheryl Cross  MRN:  [id number]  Chief Complaint:  Chief Complaint  Patient presents with   Anxiety   Depression   Follow-up   HPI: This patient is a 59 year old white female who lives alone in [place]. 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  She states she had a difficult time over the holidays because she was really missing her husband who died in 11/05/2019.  Also her mother's health is declining and she is currently in a rehab center.  On the positive side the patient has hired a family friend to help her out with driving getting groceries editor, commissioning.  She is known this person for a long time and feels very comfortable with her and feels that she is a great help.  She did cut her Prozac  down to 40 mg and feels like  this is adequate for treating her major depression.  She denies significant depression or thoughts of self-harm.  At times she feels panicky but most the time her anxiety is under good control.  She states she is sleeping well and eating better. Visit Diagnosis:    ICD-10-CM   1. Major depressive disorder, single episode, moderate (HCC)  F32.1       Past Psychiatric History: Psychiatric hospitalization in 2013, otherwise outpatient treatment  Past Medical History:  Past Medical History:  Diagnosis Date   Abdominal pain, right upper quadrant 06/10/2018   Anxiety    Arthritis    Bursitis    Cone-rod dystrophy 2021   DDD (degenerative disc disease)    Depression    Fibromyalgia    Legally blind    Nausea and vomiting 02/26/2023   Obsessive-compulsive disorder    Overactive bladder    Polycythemia vera(238.4) 11/2011   PTSD (post-traumatic stress disorder)    PVC's (premature ventricular contractions)    pt. placed on heart monitor x 24hours, everything fine    Sesamoiditis 12/2015    Past Surgical History:  Procedure Laterality Date   ABDOMINAL HYSTERECTOMY     BIOPSY  01/04/2021   Procedure: BIOPSY;  Surgeon: Eartha Angelia Sieving, MD;  [facility];  Service: Gastroenterology;;  small bowel biopsy   BIOPSY  03/27/2023   Procedure: BIOPSY;  Surgeon: Eartha Angelia Sieving, MD;  [facility];  Service: Gastroenterology;;   COLONOSCOPY WITH PROPOFOL  N/A 01/04/2021  Procedure: COLONOSCOPY WITH PROPOFOL ;  Surgeon: Eartha Angelia Sieving, MD;  [facility];  Service: Gastroenterology;  Laterality: N/A;  1115   ESOPHAGOGASTRODUODENOSCOPY N/A 09/10/2014   Procedure: ESOPHAGOGASTRODUODENOSCOPY (EGD);  Surgeon: Claudis RAYMOND Rivet, MD;  [facility];  Service: Endoscopy;  Laterality: N/A;  130 - moved to 3:15 - Ann to notify   ESOPHAGOGASTRODUODENOSCOPY (EGD) WITH PROPOFOL  N/A 01/04/2021   Procedure: ESOPHAGOGASTRODUODENOSCOPY (EGD) WITH  PROPOFOL ;  Surgeon: Eartha Angelia Sieving, MD;  [facility];  Service: Gastroenterology;  Laterality: N/A;   ESOPHAGOGASTRODUODENOSCOPY (EGD) WITH PROPOFOL  N/A 03/27/2023   Procedure: ESOPHAGOGASTRODUODENOSCOPY (EGD) WITH PROPOFOL ;  Surgeon: Eartha Angelia Sieving, MD;  [facility];  Service: Gastroenterology;  Laterality: N/A;  9:00am;asa 2   LUMBAR FUSION     POLYPECTOMY  01/04/2021   Procedure: POLYPECTOMY INTESTINAL;  Surgeon: Eartha Angelia Sieving, MD;  [facility];  Service: Gastroenterology;;  cecal lesion; ascending colon polyp;    TOOTH EXTRACTION Left Aug 2016    Family Psychiatric History: See below  Family History:  Family History  Problem Relation Age of Onset   Cancer - Colon Mother        age 50   Anxiety disorder Mother    Diabetes Mother    Atrial fibrillation Father    Lung cancer Father    Bipolar disorder Sister    Dementia Neg Hx    Drug abuse Neg Hx    Paranoid behavior Neg Hx    Schizophrenia Neg Hx    Seizures Neg Hx    Sexual abuse Neg Hx    Physical abuse Neg Hx     Social History:  Social History   Socioeconomic History   Marital status: Widowed    Spouse name: Not on file   Number of children: 0   Years of education: BA   Highest education level: Not on file  Occupational History   Occupation: [employer]- on leave (since 08/2020)  Tobacco Use   Smoking status: Every Day    Current packs/day: 1.00    Average packs/day: 1 pack/day for 28.1 years (28.1 ttl pk-yrs)    Types: Cigarettes    Start date: 11/12/1996    Passive exposure: Current   Smokeless tobacco: Never   Tobacco comments:    1 pack a day x 20 yrs.  Vaping Use   Vaping status: Never Used  Substance and Sexual Activity   Alcohol use: No    Alcohol/week: 0.0 standard drinks of alcohol   Drug use: No    Types: Hydrocodone, Benzodiazepines   Sexual activity: Not Currently  Other Topics Concern   Not on file   Social History Narrative   Right handed   2-3 glasses soda per day   Lives alone   Social Drivers of Health   Tobacco Use: High Risk (12/04/2024)   Patient History    Smoking Tobacco Use: Every Day    Smokeless Tobacco Use: Never    Passive Exposure: Current  Financial Resource Strain: Not on file  Food Insecurity: No Food Insecurity (11/23/2024)   Epic    Worried About Programme Researcher, Broadcasting/film/video in the Last Year: Never true    Ran Out of Food in the Last Year: Never true  Transportation Needs: Unmet Transportation Needs (11/23/2024)   Epic    Lack of Transportation (Medical): Yes    Lack of Transportation (Non-Medical): Yes  Physical Activity: Inactive (11/23/2024)   Exercise Vital Sign  Days of Exercise per Week: 0 days    Minutes of Exercise per Session: 0 min  Stress: No Stress Concern Present (11/23/2024)   [employer] of Occupational Health - Occupational Stress Questionnaire    Feeling of Stress: Only a little  Social Connections: Socially Isolated (11/23/2024)   Social Connection and Isolation Panel    Frequency of Communication with Friends and Family: More than three times a week    Frequency of Social Gatherings with Friends and Family: More than three times a week    Attends Religious Services: Never    Database Administrator or Organizations: No    Attends Banker Meetings: Never    Marital Status: Widowed  Depression (PHQ2-9): High Risk (11/23/2024)   Depression (PHQ2-9)    PHQ-2 Score: 12  Alcohol Screen: Not on file  Housing: Low Risk (11/23/2024)   Epic    Unable to Pay for Housing in the Last Year: No    Number of Times Moved in the Last Year: 0    Homeless in the Last Year: No  Utilities: Not At Risk (11/23/2024)   Epic    Threatened with loss of utilities: No  Health Literacy: Inadequate Health Literacy (11/23/2024)   B1300 Health Literacy    Frequency of need for help with medical instructions: Often    Allergies:  Allergies[1]  Metabolic Disorder Labs: No results found for: HGBA1C, MPG No results found for: PROLACTIN Lab Results  Component Value Date   CHOL 187 10/01/2024   TRIG 131 10/01/2024   HDL 40 (L) 10/01/2024   CHOLHDL 4.7 10/01/2024   LDLCALC 122 (H) 10/01/2024   Lab Results  Component Value Date   TSH 2.387 07/04/2023   TSH 1.770 10/13/2020    Therapeutic Level Labs: No results found for: LITHIUM No results found for: VALPROATE No results found for: CBMZ  Current Medications: Current Outpatient Medications  Medication Sig Dispense Refill   Acetylcysteine (N-ACETYL CYSTEINE) 600 MG CAPS Take 1 capsule by mouth as needed (for sight). (Patient not taking: Reported on 11/23/2024)     albuterol  (PROVENTIL ) 2 MG tablet Take 2 mg by mouth in the morning, at noon, in the evening, and at bedtime. (Patient taking differently: Take 2 mg by mouth 4 (four) times daily as needed for wheezing.)     cholecalciferol  (VITAMIN D3) 25 MCG (1000 UNIT) tablet Take 2 tablets (2,000 Units total) by mouth daily. 120 tablet 2   clonazePAM  (KLONOPIN ) 0.5 MG tablet Take 1 tablet (0.5 mg total) by mouth 4 (four) times daily as needed for anxiety. 120 tablet 2   diclofenac Sodium (VOLTAREN) 1 % GEL Apply 1 Application topically 4 (four) times daily as needed (pain. (applied to foot area)).     FLUoxetine  (PROZAC ) 40 MG capsule Take 1 capsule (40 mg total) by mouth daily. 30 capsule 2   lidocaine  (LIDODERM ) 5 % Place 1 patch onto the skin daily.     loratadine  (CLARITIN ) 10 MG tablet Take 10 mg by mouth daily. (Patient taking differently: Take 10 mg by mouth as needed.)     meclizine (ANTIVERT) 25 MG tablet Take 25 mg by mouth 4 (four) times daily as needed for dizziness.     methocarbamol  (ROBAXIN ) 500 MG tablet Take 500 mg by mouth every 6 (six) hours as needed for muscle spasms.     potassium chloride  SA (KLOR-CON  M) 20 MEQ tablet Take 1 tablet (20 mEq total) by mouth daily. 90 tablet 0  traMADol  (ULTRAM ) 50 MG tablet Take 1 tablet (50 mg total) by mouth daily as needed for severe pain (pain score 7-10). 60 tablet 0   TRIAMCINOLONE  ACETONIDE,NASAL, NA Place 2 sprays into the nose 1 day or 1 dose.     vitamin B-12 (CYANOCOBALAMIN ) 100 MCG tablet Take 100 mcg by mouth daily. (Patient taking differently: Take 500 mcg by mouth daily.)     No current facility-administered medications for this visit.     Musculoskeletal: Strength & Muscle Tone: within normal limits Gait & Station: normal Patient leans: N/A  Psychiatric Specialty Exam: Review of Systems  Eyes:  Positive for visual disturbance.  All other systems reviewed and are negative.   There were no vitals taken for this visit.There is no height or weight on file to calculate BMI.  General Appearance: Casual and Fairly Groomed  Eye Contact:  Good  Speech:  Clear and Coherent  Volume:  Normal  Mood:  Euthymic  Affect:  Congruent  Thought Process:  Goal Directed  Orientation:  Full (Time, Place, and Person)  Thought Content: Rumination   Suicidal Thoughts:  No  Homicidal Thoughts:  No  Memory:  Immediate;   Good Recent;   Good Remote;   Good  Judgement:  Good  Insight:  Good  Psychomotor Activity:  Normal  Concentration:  Concentration: Good and Attention Span: Good  Recall:  Good  Fund of Knowledge: Good  Language: Good  Akathisia:  No  Handed:  Right  AIMS (if indicated): not done  Assets:  Communication Skills Desire for Improvement Resilience Social Support  ADL's:  Intact  Cognition: WNL  Sleep:  Good   Screenings: GAD-7    Flowsheet Row Office Visit from 05/22/2023 in [facility] [place] [facility] from 01/13/2021 in [facility] [place]  Total GAD-7 Score 21 17   PHQ2-9    Flowsheet Row Office Visit from 11/23/2024 in [facility] Office Visit from 06/24/2024 in [facility] [place] [facility]  [place] Video Visit from 10/29/2023 in [facility] at [place] Counselor from 10/08/2023 in [facility] at [place] Office Visit from 05/22/2023 in [facility] at [facility] Total Score 5 4 4 2 6   PHQ-9 Total Score 12 16 16 12 18    Flowsheet Row Video Visit from 10/29/2023 in [facility] at [place] Counselor from 10/08/2023 in [facility] at [place] Office Visit from 05/22/2023 in [facility] at [place]  C-SSRS RISK CATEGORY No Risk No Risk Error: Question 6 not populated     Assessment and Plan: This patient is a 59 year old female with a history of substance abuse in remission, major depressive disorder generalized anxiety and severe visual impairment.  Given the circumstances she continues to do well.  She will continue Prozac  40 mg daily for major depression and clonazepam  4 times daily as needed for generalized anxiety.  She will return to see me in 3 months  Collaboration of Care: Collaboration of Care: Primary Care Provider AEB notes will be shared with PCP at patient's request  Patient/Guardian was advised Release of Information must be obtained prior to any record release in order to collaborate their care with an outside provider. Patient/Guardian was advised if they have not already done so to contact the registration department to sign all necessary forms in  order for us  to release information regarding their care.   Consent: Patient/Guardian gives verbal consent for treatment and assignment of benefits for services provided during this visit. Patient/Guardian expressed understanding and agreed to proceed.    Cheryl Gull, MD 12/04/2024, 11:21 AM     [1]  Allergies Allergen Reactions   Gabapentin  Hives   Hydrocodone-Acetaminophen  Nausea Only   Penicillins Other (See  Comments)    Unknown child hood reaction  .Did it involve swelling of the face/tongue/throat, SOB, or low BP? Unknown  Did it involve sudden or severe rash/hives, skin peeling, or any reaction on the inside of your mouth or nose? Unknown  Did you need to seek medical attention at a hospital or doctor's office? Unknown  When did it last happen?        If all above answers are NO, may proceed with cephalosporin use.  Product containing penicillin (product)  Not sure - allergy when a child    Unknown child hood reaction .Did it involve swelling of the face/tongue/throat, SOB, or low BP? Unknown Did it involve sudden or severe rash/hives, skin peeling, or any reaction on the inside of your mouth or nose? Unknown Did you need to seek medical attention at a hospital or doctor's office? Unknown When did it last happen?       If all above answers are NO, may proceed with cephalosporin use.    penicillin V

## 2025-03-30 ENCOUNTER — Inpatient Hospital Stay

## 2025-04-06 ENCOUNTER — Inpatient Hospital Stay: Admitting: Oncology

## 2025-10-04 ENCOUNTER — Other Ambulatory Visit

## 2025-10-11 ENCOUNTER — Encounter: Admitting: Family Medicine
